# Patient Record
Sex: Male | Born: 1945 | Race: White | Hispanic: No | Marital: Married | Smoking: Current every day smoker
Health system: Southern US, Community
[De-identification: ages and names within clinical notes are randomized; demographics above are authoritative.]

## PROBLEM LIST (undated history)

## (undated) DIAGNOSIS — Z951 Presence of aortocoronary bypass graft: Secondary | ICD-10-CM

## (undated) DIAGNOSIS — I6529 Occlusion and stenosis of unspecified carotid artery: Secondary | ICD-10-CM

## (undated) DIAGNOSIS — J189 Pneumonia, unspecified organism: Secondary | ICD-10-CM

## (undated) DIAGNOSIS — I739 Peripheral vascular disease, unspecified: Secondary | ICD-10-CM

## (undated) DIAGNOSIS — E119 Type 2 diabetes mellitus without complications: Secondary | ICD-10-CM

## (undated) DIAGNOSIS — D751 Secondary polycythemia: Secondary | ICD-10-CM

## (undated) DIAGNOSIS — M1990 Unspecified osteoarthritis, unspecified site: Secondary | ICD-10-CM

## (undated) DIAGNOSIS — G4733 Obstructive sleep apnea (adult) (pediatric): Secondary | ICD-10-CM

## (undated) DIAGNOSIS — R51 Headache: Secondary | ICD-10-CM

## (undated) DIAGNOSIS — I2510 Atherosclerotic heart disease of native coronary artery without angina pectoris: Secondary | ICD-10-CM

## (undated) DIAGNOSIS — I219 Acute myocardial infarction, unspecified: Secondary | ICD-10-CM

## (undated) DIAGNOSIS — E785 Hyperlipidemia, unspecified: Secondary | ICD-10-CM

## (undated) DIAGNOSIS — Z9989 Dependence on other enabling machines and devices: Secondary | ICD-10-CM

## (undated) DIAGNOSIS — R519 Headache, unspecified: Secondary | ICD-10-CM

## (undated) DIAGNOSIS — K219 Gastro-esophageal reflux disease without esophagitis: Secondary | ICD-10-CM

## (undated) DIAGNOSIS — N183 Chronic kidney disease, stage 3 unspecified: Secondary | ICD-10-CM

## (undated) DIAGNOSIS — I714 Abdominal aortic aneurysm, without rupture, unspecified: Secondary | ICD-10-CM

## (undated) DIAGNOSIS — Z720 Tobacco use: Secondary | ICD-10-CM

## (undated) DIAGNOSIS — I10 Essential (primary) hypertension: Secondary | ICD-10-CM

## (undated) HISTORY — DX: Obstructive sleep apnea (adult) (pediatric): G47.33

## (undated) HISTORY — DX: Presence of aortocoronary bypass graft: Z95.1

## (undated) HISTORY — DX: Dependence on other enabling machines and devices: Z99.89

## (undated) HISTORY — DX: Atherosclerotic heart disease of native coronary artery without angina pectoris: I25.10

## (undated) HISTORY — DX: Occlusion and stenosis of unspecified carotid artery: I65.29

## (undated) HISTORY — DX: Hyperlipidemia, unspecified: E78.5

## (undated) HISTORY — DX: Abdominal aortic aneurysm, without rupture: I71.4

## (undated) HISTORY — DX: Gastro-esophageal reflux disease without esophagitis: K21.9

## (undated) HISTORY — DX: Tobacco use: Z72.0

## (undated) HISTORY — DX: Abdominal aortic aneurysm, without rupture, unspecified: I71.40

## (undated) HISTORY — DX: Peripheral vascular disease, unspecified: I73.9

## (undated) HISTORY — DX: Essential (primary) hypertension: I10

## (undated) HISTORY — DX: Secondary polycythemia: D75.1

---

## 1986-05-24 HISTORY — PX: BACK SURGERY: SHX140

## 1994-05-24 DIAGNOSIS — Z951 Presence of aortocoronary bypass graft: Secondary | ICD-10-CM

## 1994-05-24 DIAGNOSIS — I219 Acute myocardial infarction, unspecified: Secondary | ICD-10-CM

## 1994-05-24 HISTORY — DX: Presence of aortocoronary bypass graft: Z95.1

## 1994-05-24 HISTORY — PX: CORONARY ARTERY BYPASS GRAFT: SHX141

## 1994-05-24 HISTORY — DX: Acute myocardial infarction, unspecified: I21.9

## 1996-05-24 HISTORY — PX: LUMBAR DISC SURGERY: SHX700

## 2000-03-05 ENCOUNTER — Ambulatory Visit (HOSPITAL_COMMUNITY): Admission: RE | Admit: 2000-03-05 | Discharge: 2000-03-06 | Payer: Self-pay | Admitting: Family Medicine

## 2000-03-05 ENCOUNTER — Inpatient Hospital Stay: Admission: RE | Admit: 2000-03-05 | Discharge: 2000-03-06 | Payer: Self-pay | Admitting: Family Medicine

## 2000-03-05 ENCOUNTER — Encounter: Payer: Self-pay | Admitting: Family Medicine

## 2000-03-06 ENCOUNTER — Encounter: Payer: Self-pay | Admitting: Family Medicine

## 2000-03-11 ENCOUNTER — Ambulatory Visit (HOSPITAL_COMMUNITY): Admission: RE | Admit: 2000-03-11 | Discharge: 2000-03-11 | Payer: Self-pay | Admitting: Family Medicine

## 2000-03-11 ENCOUNTER — Encounter: Payer: Self-pay | Admitting: Family Medicine

## 2000-03-15 ENCOUNTER — Ambulatory Visit (HOSPITAL_BASED_OUTPATIENT_CLINIC_OR_DEPARTMENT_OTHER): Admission: RE | Admit: 2000-03-15 | Discharge: 2000-03-15 | Payer: Self-pay | Admitting: Orthopedic Surgery

## 2003-11-11 ENCOUNTER — Ambulatory Visit (HOSPITAL_COMMUNITY): Admission: RE | Admit: 2003-11-11 | Discharge: 2003-11-11 | Payer: Self-pay | Admitting: Gastroenterology

## 2003-11-11 ENCOUNTER — Encounter (INDEPENDENT_AMBULATORY_CARE_PROVIDER_SITE_OTHER): Payer: Self-pay | Admitting: *Deleted

## 2007-11-22 ENCOUNTER — Ambulatory Visit (HOSPITAL_COMMUNITY): Admission: RE | Admit: 2007-11-22 | Discharge: 2007-11-23 | Payer: Self-pay | Admitting: Cardiovascular Disease

## 2007-11-22 HISTORY — PX: OTHER SURGICAL HISTORY: SHX169

## 2007-12-21 ENCOUNTER — Ambulatory Visit: Payer: Self-pay | Admitting: *Deleted

## 2008-01-15 ENCOUNTER — Ambulatory Visit: Payer: Self-pay | Admitting: *Deleted

## 2008-01-15 ENCOUNTER — Encounter (INDEPENDENT_AMBULATORY_CARE_PROVIDER_SITE_OTHER): Payer: Self-pay | Admitting: *Deleted

## 2008-01-15 ENCOUNTER — Inpatient Hospital Stay (HOSPITAL_COMMUNITY): Admission: RE | Admit: 2008-01-15 | Discharge: 2008-01-16 | Payer: Self-pay | Admitting: *Deleted

## 2008-01-15 DIAGNOSIS — I6529 Occlusion and stenosis of unspecified carotid artery: Secondary | ICD-10-CM

## 2008-01-15 HISTORY — DX: Occlusion and stenosis of unspecified carotid artery: I65.29

## 2008-01-15 HISTORY — PX: CAROTID ENDARTERECTOMY: SUR193

## 2008-02-01 ENCOUNTER — Ambulatory Visit: Payer: Self-pay | Admitting: *Deleted

## 2009-06-12 DIAGNOSIS — I739 Peripheral vascular disease, unspecified: Secondary | ICD-10-CM

## 2009-06-12 HISTORY — DX: Peripheral vascular disease, unspecified: I73.9

## 2010-10-06 NOTE — Consult Note (Signed)
VASCULAR SURGERY CONSULTATION   Barry Lawrence, Barry Lawrence  DOB:  [birth year]                                       12/21/2007  [id number]   PRIMARY CARE PHYSICIAN:  Kathryne Eriksson, M.D.   REASON FOR REFERRAL:  Severe right internal carotid artery stenosis.   HISTORY:  The patient is a 65 year old gentleman referred for evaluation  and management of his severe right internal carotid artery stenosis.  Denies a history of stroke.  No recent visual, sensory, motor or speech  problems.  No syncope or presyncope.   Risk factors for cerebrovascular disease include hypertension,  hyperlipidemia, coronary artery disease, family history, and tobacco  abuse.   Carotid Doppler carried out 10/17/2007 reveals elevated velocities in  the bulb and proximal right internal carotid artery consistent with a  severe stenosis.   Cerebral arteriogram was carried out 11/22/2007 and verifies severe  right internal carotid artery stenosis with acentric calcified plaque.   PAST MEDICAL HISTORY:  1. Hypertension.  2. Hyperlipidemia.  3. Coronary artery disease status post coronary bypass.  4. Peripheral vascular disease.   MEDICATIONS:  Carvedilol 12.5 mg 1/2 tablet b.i.d.  Amlodipine 10 mg  daily.  Diovan hydrochlorothiazide 160/25 daily.  Tricor 145 mg daily.  Omeprazole 20 mg daily.  Vytorin 10/80 one tablet daily.  Goody's Powder  1 to 2 daily.   ALLERGIES:  None known.   FAMILY HISTORY:  Mother died in an accident at a young age.  Father died  age 43 of heart disease and complications of diabetes.  One sister and  brother each have diabetes.   SOCIAL HISTORY:  The patient is married with 2 children.  He is on long-  term disability.  Continues to smoke up to 1 pack of cigarettes daily.  Does not drink alcohol on a regular basis.   REVIEW OF SYSTEMS:  Refer to patient encounter form.  The patient notes  some gastroesophageal reflux.  Pain in legs with walking.  Refer to  patient encounter form.   PHYSICAL EXAM:  A 65 year old male who appears stated age.  No acute  distress.  Alert and oriented.  Vital Signs:  BP 128/75, pulse 67 per  minute.  HEENT:  Mouth and throat are clear.  Normocephalic.  Extraocular movements intact.  Neck:  Supple.  No thyromegaly,  adenopathy.  Cardiovascular:  Normal heart sounds without murmurs.  Right carotid bruit.  Regular rate and rhythm.  Chest:  Clear equal  entry bilaterally.  Abdomen:  Soft, nontender, no masses or  organomegaly.  Neurologic:  Cranial nerves intact.  Strength equal  bilaterally.  1+ reflexes bilaterally.  Normal gait.  Skin:  Warm, dry,  intact.   IMPRESSION:  1. Severe right internal carotid artery stenosis.  2. Hypertension.  3. Hyperlipidemia.  4. Coronary artery disease.  5. Peripheral vascular disease.   RECOMMENDATIONS:  The patient will be placed on the elective schedule  for right carotid endarterectomy for reduction of stroke risk.  Continue  aspirin on a daily basis.  Reduce tobacco use.   Dorothea Glassman, M.D.  Electronically Signed  PGH/MEDQ  D:  12/21/2007  T:  12/22/2007  Job:  [id number]   cc:   Jama Flavors. Redmond Pulling, M.D.  Quay Burow, M.D.

## 2010-10-06 NOTE — Discharge Summary (Signed)
NAME:  [name], [name] NO.:  [id number]   MEDICAL RECORD NO.:  [id number]          PATIENT TYPE:  OIB   LOCATION:  [id number]                         FACILITY:  [facility]   PHYSICIAN:  Quay Burow, M.D.   DATE OF BIRTH:  11/04/1945   DATE OF ADMISSION:  11/22/2007  DATE OF DISCHARGE:  11/23/2007                               DISCHARGE SUMMARY   [name] is a 65 year old male patient of Dr. Quay Burow  who has known coronary disease and peripheral vascular disease.  He was  seen in the office on November 14, 2007, for follow up Dopplers.  His  carotids showed a high-grade stenosis.  He was having claudication with  known lower extremity PVD in the past.  Thus, he was set up for PV  angiogram of carotids and lower extremities.  This was performed on November 22, 2007, by Dr. Einar Gip.  He had an 80% new right external iliac artery  stenosis.  He did have old iliac stent that was patent.  He had  aneurysmal small dilatation of his aorta.  His carotids revealed that he  had 95% right ICA stenosis.  Left ICA had mild plaque.  He had 40% right  distal codominant vertebral stenosis, left vertebra came off arch that  was small and nondominant.  He had normal intracerebral vasculature.  Please see Dr. Irven Shelling procedure dictation for complete details.  He  subsequently underwent stenting of his right external iliac with a SMART  Control iliac stent 10 mm x 40 mm.  The following morning, he was seen  by Dr. Quay Burow and considered stable for discharge home.   His blood pressure was 149/88.  His pulse was 58.   His medications are the same as prior to hospitalization which include  amlodipine 10 mg a diet, Diovan 160/25 a day, Tricor 145 mg a day,  Vytorin 10/80 a day, omeprazole 20 mg a day, carvedilol 12.5 mg half  b.i.d. which he takes 5 day, but he is not on aspirin.   He will follow up with Dr. Gwenlyn Found on December 01, 2007.   He has follow up Dopplers on December 11, 2007, and  he was again reminded to  quit smoking.   Labs today showed sodium 138, potassium 4.1, glucose 85, BUN 16, and  creatinine 1.28.  His CBC showed hemoglobin of 19, hematocrit was 54.7,  WBCs 10, and platelets were 198,000.   ASSESSMENT:  1. Peripheral vascular disease status post lower extremity peripheral      vascular angio and cerebral angio showing high-grade stenosis of      his carotid artery.  Dr. Gwenlyn Found reviewed, things he will need a CEA      and he will follow up with Dr. Gwenlyn Found and be referred to a vascular      surgeon.  He did have a right external iliac artery new stenosis      which was stented by Dr. Adrian Prows.  2. Known coronary artery disease, stable with no angina.  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  4. Tobacco smoking.  His  has tried to decrease in the past, states he      is unable to quit.  He was encouraged again to quit.  5. Hyperlipidemia.      Cyndia Bent, N.P.      Quay Burow, M.D.  Electronically Signed    BB/MEDQ  D:  11/23/2007  T:  11/24/2007  Job:  [id number]

## 2010-10-06 NOTE — Cardiovascular Report (Signed)
Barry Lawrence, Barry Lawrence NO.:  [id number]   MEDICAL RECORD NO.:  [id number]          PATIENT TYPE:  INP   LOCATION:  59                         FACILITY:  [facility]   PHYSICIAN:  Eden Lathe. Einar Gip, MD       DATE OF BIRTH:  15-Apr-1946   DATE OF PROCEDURE:  DATE OF DISCHARGE:                            CARDIAC CATHETERIZATION   ASSISTANT:  Quay Burow, MD   PRIMARY OPERATOR:  Eden Lathe. Einar Gip, MD   PROCEDURE PERFORMED:  1. Arch aortogram.  2. Four-vessel cerebral angiography, both intracranial and      extracranial.  3. Abdominal aortogram.  4. Pelvic aortogram.  5. PTA and direct stenting of the right external iliac artery.   INDICATIONS:  [name] is a 64 year old gentleman with known  coronary artery disease with history of bypass in 1996.  He has been  doing well from a cardiac standpoint.  He was found to have carotid  artery high-grade stenosis by carotid duplex with velocities of 123456  cm/sec, systolic and diastolic of 98 cm/sec.  Given this, he was brought  to the peripheral angiography suite to evaluate his cerebral anatomy for  confirming his severity of the stenoses.  Abdominal aortogram and pelvic  aortogram were performed because of known peripheral arterial disease  and he has undergone PTA and stenting of bilateral iliac arteries in  1997, and also left SFA in 1998.  We had difficulty in access and there  was suspicion for high-grade stenosis in the right external iliac  artery.  Hence, iliac arteriogram and eventually angioplasty was  performed.  He also had abnormal Dopplers of the lower extremities  revealing a 340 cm/sec velocities across the right external iliac artery  on Oct 17, 2007.  His ABI was 0.88 and 0.87 on the right and left  respectively.   ANGIOGRAPHIC DATA:  Arch aortogram:  There is type 1 aortic arch.  The  left vertebral artery arose directly from the arch of the aorta between  the innominate and left common carotid artery.   It was small,  nondominant, and normal.   Right carotid artery:  The right carotid artery was smooth and normal.  Right internal carotid artery showed eccentric calcification with a 95%  stenosis.  There was poststenotic dilatation.  The external carotid  artery was normal.   The intracerebral portion of the right internal carotid artery was  normal with normal cavernous filling.   Right vertebral artery:  The right vertebral artery was normal at its  origin; however, in the mid-to-distal segment, there is a 40% stenoses.  This was a large vertebral artery and a dominant vertebral artery.   The intracerebral portion of the left common carotid artery was normal  with normal cavernous filling.   Right innominate and proximal right subclavian:  Right innominate and  right proximal subclavian artery were widely patent.   Left common carotid artery:  The left common carotid artery was widely  patent and the left internal carotid artery showed a 20%-30% stenoses.  The external carotid artery was normal.   Intracerebral posterior circulation  was normal with normal venous  filling.   Left vertebral artery:  The left vertebral artery was not cannulated as  this was small and nondominant and arose from the arch of the aorta.   Abdominal aortogram:  The abdominal aortogram revealed presence of two  renal arteries one on either sides.  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   Left iliac artery:  The left iliac artery was selectively cannulated and  this revealed left iliac artery stent to be widely patent, left iliac  artery ostium was also widely patent.  There was a 30% stenosis in the  left common iliac artery.  The previously placed left external iliac  artery stent is widely patent.   Right iliac artery:  The right common iliac artery showed tortuosity,  but is otherwise normal, and the previously placed stent was also widely  patent.  Right common iliac  artery stent was widely patent; however,  there was a 70%-80% stenosis of the right external iliac artery.  There  was about a 25-mm pressure gradient across this stenoses.   INTERVENTION DATA:  Successful PTA and direct stenting of the right  external iliac artery overlapping with the previously placed right  common iliac artery stent with implantation of a 10.0 x 40 mm Smart self-  expanding stent, which was postdilated with a 7.0 x 40 mm Powerflex  balloon at 5 atmospheric pressure.  The stenosis was reduced from 80% to  0% with excellent results.   RECOMMENDATIONS:  The patient will need revascularization of his right  carotid artery stenosis.  Dr. Quay Burow will follow up with the  patient for further recommendation.   He will need continued aggressive risk modification.  He will be  discharged home in the morning if he remain stable.   TECHNIQUE OF THE PROCEDURE:  Under usual sterile precautions using a 5-  French right femoral arterial access we attempted to cross through the  right iliac artery.  There was difficulty in doing this, but with use of  a H1 catheter we were able to manipulate through the right iliac  stenosis and also right iliac artery stent, and the wooly wire was  placed into the abdominal aorta.  All the catheter exchanges were done  over a long wire.   Using a pigtail catheter we were able to advance the pigtail catheter  into the arch of the aorta and arch aortogram was performed in the LAO  projection.  Then using JB1 catheter selective cannulation of the  cerebral vessels were performed.  The catheter was then pulled out of  the body over a wooly wire.   Using a crossover catheter, the left iliac artery was selectively  cannulated and angiography was performed.  The wooly wire was placed  back into the abdominal aorta.  The crossover catheter was pulled out of  the body and we decided to proceed with intervention to the right  external iliac  artery.   We used a 6-French long sheath and we administered 300 mcg of  intraarterial nitroglycerin and careful pullback of the right external  iliac artery stenosis was performed.  Having performed this, there was a  25-mm pressure gradient, hence we decided to stent this and stenting was  performed using a 10.0 x 40 mm Smart stent and postdilated with a  7.0 x 40 mm Powerflex balloon with excellent results.  The sheath was  sutured in place and the patient was transferred to  the holding area in  a stable condition.  The patient tolerated the procedure well.  No  immediate complications noted.      Eden Lathe. Einar Gip, MD  Electronically Signed     JRG/MEDQ  D:  11/22/2007  T:  11/23/2007  Job:  [id number]   cc:   Jama Flavors. Redmond Pulling, M.D.

## 2010-10-06 NOTE — Assessment & Plan Note (Signed)
OFFICE VISIT   Lawrence, Barry L  DOB:  1946/02/22                                       02/01/2008  [id number]   The patient underwent a right carotid endarterectomy on 01/15/2008 at  [facility].  He was discharged home postop day #1 in good  condition.  No major complaints.  Since discharge he has been doing  well.   His blood pressure is 165/89, pulse is 67 per minute.  Right neck  incision healing well.  No carotid bruits.  Cranial nerves intact.  Strength equal bilaterally.   The patient overall is doing well.  I will plan to follow up with him  again in 6 months with a repeat carotid Doppler evaluation.   Dorothea Glassman, M.D.  Electronically Signed   PGH/MEDQ  D:  02/01/2008  T:  02/02/2008  Job:  [id number]   cc:   Jama Flavors. Redmond Pulling, M.D.

## 2010-10-06 NOTE — Op Note (Signed)
NAMEJOANDRY, WOLZ NO.:  [id number]   MEDICAL RECORD NO.:  [id number]          PATIENT TYPE:  INP   LOCATION:  [id number]                         FACILITY:  [facility]   PHYSICIAN:  Dorothea Glassman, M.D.    DATE OF BIRTH:  1945-10-24   DATE OF PROCEDURE:  01/15/2008  DATE OF DISCHARGE:  01/16/2008                               OPERATIVE REPORT   SURGEON:  Dorothea Glassman, MD   ASSISTANT:  Jacinta Shoe, PA   ANESTHESIA:  General endotracheal.   PREOPERATIVE DIAGNOSIS:  Severe right internal carotid artery stenosis.   POSTOPERATIVE DIAGNOSIS:  Severe right internal carotid artery stenosis.   PROCEDURE:  Right carotid endarterectomy with Dacron patch angioplasty.   CLINICAL NOTE:  Barry Lawrence is a 65 year old male with a history of  atherosclerotic vascular disease.  A right carotid stenosis was  discovered and arteriography verified a severe right internal carotid  artery stenosis.  The patient was referred for surgical management, seen  and evaluated, he consented to right carotid endarterectomy for  reduction of stroke risk.  Risks of the operative procedure were  reviewed with the patient preoperatively including but not limited to  MI, CVA, cranial nerve injury, and death.   OPERATIVE PROCEDURE:  The patient was brought to the operating room in a  stable hemodynamic condition.  Placed under general endotracheal  anesthesia.  Right neck prepped and draped in a sterile fashion.  Foley  catheter and arterial line in place.   Curvilinear skin incision made along the anterior border of right  sternomastoid muscle.  Dissection carried down through the subcutaneous  tissue with electrocautery.  Platysma divided.  Deep dissection carried  down to expose the facial vein, which was ligated with 2-0 silk and  divided.  The carotid bifurcation exposed, common carotid artery  mobilized down to the omohyoid muscle encircled with a vessel loop.  The  vagus nerve  reflected posteriorly and preserved.  The superior thyroid  and external carotid arteries were freed and encircled with vessel  loops.  The internal carotid artery followed up distally to the  posterior belly of the digastric muscle.  The hypoglossal nerve  identified, mobilized, and retracted.  The distal internal carotid  artery encircled with a vessel loop.   Evaluation of the carotid bifurcation did reveal plaque in the bulb  extending well up into the internal carotid arteries 2-3 cm.  The distal  internal carotid artery was free of significant disease.   The patient was administered 7000 units of heparin intravenously.  Adequate circulation time permitted.  The carotid vessels controlled  with clamps.  Longitudinal arteriotomy made in the distal common carotid  artery.  The arteriotomy extended across the carotid bulb and up into  the internal carotid artery.  There was a high-grade stenosis at the  origin of the right internal carotid artery with a long segment of  plaque extending into the internal carotid artery 2-3 cm.  A shunt was  inserted.   An endarterectomy elevator used to remove the plaque.  The  endarterectomy carried down to the common  carotid artery with plaque was  divided transversely with Potts scissors.  Plaque then raised up into  the bulb and superior thyroid and external carotid endarterectomized  using an eversion technique.  The distal internal carotid artery plaque  feathered out well.  The fragments of plaque removed with fine forceps.  The site irrigated with heparin saline solution.   A patch angioplasty endarterectomy site was then carried out with a  Finesse Dacron patch using running 6-0 Prolene suture.  At completion of  the patch angioplasty, the shunt was removed and all vessels well  flushed.  Clamps removed directing initial antegrade flow up the  external carotid artery, following this the internal carotid artery was  released.   There is  an excellent pulse and Doppler signal in the distal internal  carotid artery.   Adequate hemostasis obtained.  Sponge and instrument counts correct.  The patient was administered 50 mg protamine intravenously.   The sternomastoid fascia closed in a running 2-0 Vicryl suture.  Platysma closed in a running 3-0 Vicryl suture.  Skin closed in 4-0  Monocryl.  Dermabond applied.   The patient tolerated the procedure well.  No apparent complications.  Awakened readily and was neurologically intact.  Transferred to recovery  room in stable condition.      Dorothea Glassman, M.D.  Electronically Signed     PGH/MEDQ  D:  01/16/2008  T:  01/16/2008  Job:  [id number]   cc:   Quay Burow, M.D.  Jama Flavors. Redmond Pulling, M.D.

## 2010-10-06 NOTE — Discharge Summary (Signed)
NAMEHAIDEN, GARDE NO.:  [id number]   MEDICAL RECORD NO.:  [id number]          PATIENT TYPE:  INP   LOCATION:  [id number]                         FACILITY:  [facility]   PHYSICIAN:  Dorothea Glassman, M.D.    DATE OF BIRTH:  05/30/1945   DATE OF ADMISSION:  01/15/2008  DATE OF DISCHARGE:  01/16/2008                               DISCHARGE SUMMARY   ADMISSION DIAGNOSIS:  Severe right internal carotid artery stenosis,  asymptomatic.   SECONDARY DIAGNOSES:  1. Severe right internal carotid artery stenosis, asymptomatic status      post right carotid endarterectomy.  2. Coronary artery disease, followed by Dr. Gwenlyn Found.  3. Peripheral vascular disease status post lower extremity peripheral      vascular angioplasty and right external iliac artery stenosis which      was stented by Dr. Adrian Prows.  4. History of polycythemia secondary to smoking.  5. Ongoing tobacco abuse.  6. Hyperlipidemia.  7. Hypertension.  8. Gastroesophageal reflux disease.  9. History of coronary artery bypass graft in 1996.  10.History of lumbar diskectomy in 1998.  11.History of obstructive sleep apnea but is noncompliant with CPAP.  12.Mild renal insufficiency with postoperative creatinine of 1.6 with      baseline creatinine around 1.3.   PROCEDURES:  January 15, 2008, right carotid endarterectomy with Dacron  patch angioplasty by Dr. Gordy Clement.   BRIEF HISTORY:  Mr. Lemm is a 65 year old male referred to Dr. Amedeo Plenty  for evaluation and management of severe right internal carotid artery  stenosis.  He is asymptomatic.  Carotid Dopplers from Oct 17, 2007  revealed elevated velocities in the bulb and proximal right internal  carotid artery consistent with severe stenosis.  Cerebral arteriogram  was carried out on November 22, 2007 by Dr. Adrian Prows, verified severe right  internal carotid artery stenosis with eccentric calcified plaque.  Dr.  Amedeo Plenty recommended that he undergo elective right carotid  endarterectomy,  to reduce his risk for future stroke, and also per smoking cessation.   HOSPITAL COURSE:  Mr. Arth was electively admitted to [facility] and underwent the previously mentioned procedure.  He was  extubated, neurologically intact, taken to short-stay recovery and was  transferred to 3300.  In the immediate postoperative phase, he was  somewhat drowsy and was felt to be having obstructive sleep apnea  symptoms as he required frequent awakening due to desaturations with  sleeping with associated bradycardia and hypotension, which quickly  rebounded when the patient is awakened.  Also after surgery, he was  noted to have some blood-tinged urine.  His Foley catheter was irrigated  and on postop day #1 had been discontinued and he was voiding.  As a  precaution, we did send a urinalysis as it had a faint odor, however,  this showed no signs for infection as it was negative for leukocytes and  nitrates.  There was trace blood, which again was felt secondary to his  Foley catheter insertion.  Otherwise, Mr. Kilday is doing well.  He  remained neurologically intact.  Incision showed no signs  of hematoma.  Tongue was midline and he denied dysphagia.  He did have some muscle  spasm which was treated with Flexeril.  By discharge, his pain was  controlled.  He mobilized and tolerating regular food and ambulating  without difficulty.  Postoperative labs showed white count 11.2,  hemoglobin 15.8, hematocrit 46.2, and platelet count 165.  Sodium 132,  potassium 4.0, glucose 95, creatinine 1.6, and BUN of 21.  His  preoperative creatinine was 1.28.  Mr. Wong was felt appropriate for  discharge home on postoperative day #1, January 16, 2008.  He was  discharged in stable condition.   DISCHARGE MEDICATIONS:  1. Coreg 12.5 mg one-half tablet p.o. b.i.d.  2. Amlodipine 10 mg 1 p.o. daily.  3. TriCor 145 mg p.o. q.p.m.  4. Omeprazole 20 mg p.o. q.a.m.  5. Vytorin 10/80 mg  1 p.o. q.p.m.  6. Diovan HCT 160/25 mg 1 p.o. q.a.m.  7. Goody powders p.r.n. per pack instructions.  8. Oxycodone 5 mg 1-2 tablets p.o. q.4 h. p.r.n. pain.  9. Coated aspirin 325 mg p.o. daily.   DISCHARGE INSTRUCTIONS:  Continue heart-healthy diet.  Clean incisions  gently with soap and water.  Avoid driving or heavy lifting for the next  2 weeks.  Increase activity slowly.  Call if he develops fever greater  than 101, redness or drainage from incision site or new neurologic  symptoms.  He will see Dr. Amedeo Plenty at the [facility] office in 2-3 weeks.  Our  office will contact him regarding specific appointment date and time.      Jacinta Shoe, P.A.      Dorothea Glassman, M.D.  Electronically Signed    AWZ/MEDQ  D:  01/16/2008  T:  01/17/2008  Job:  [id number]   cc:   Jama Flavors. Redmond Pulling, M.D.  Quay Burow, M.D.
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
## 2010-10-09 NOTE — Op Note (Signed)
Barry Lawrence, Barry Lawrence                         ACCOUNT NO.:  [id number]   MEDICAL RECORD NO.:  [id number]                   PATIENT TYPE:  AMB   LOCATION:  ENDO                                 FACILITY:  [facility]   PHYSICIAN:  Nelwyn Salisbury, M.D.               DATE OF BIRTH:  05-Sep-1945   DATE OF PROCEDURE:  11/11/2003  DATE OF DISCHARGE:                                 OPERATIVE REPORT   PROCEDURE PERFORMED:  Screening colonoscopy.   ENDOSCOPIST:  Nelwyn Salisbury, M.D.   INSTRUMENT USED:  Olympus video colonoscope.   INDICATION FOR PROCEDURE:  A 65 year old white male undergoing screening  colonoscopy.  Rule out colonic polyps, masses, etc.  The patient has a  longstanding history of nonsteroidal use (eight to 10 Goody Powders per  day).  Rule out colonic polyps, masses, etc.   PREPROCEDURE PREPARATION:  Informed consent was procured from the patient.  The patient had fasted for eight hours prior to the procedure and prepped  with a bottle of magnesium citrate and a gallon of GoLYTELY the night prior  to the procedure.   PREPROCEDURE PHYSICAL:  VITAL SIGNS:  The patient had stable vital signs.  NECK:  Supple.  CHEST:  Clear to auscultation.  S1, S2 regular.  ABDOMEN:  Soft with normal bowel sounds.   DESCRIPTION OF PROCEDURE:  The patient was placed in the left lateral  decubitus position and sedated with 75 mg of Demerol and 6 mg of Versed in  slow incremental doses.  Once the patient was adequately sedate and  maintained on low-flow oxygen and continuous cardiac monitoring, the Olympus  video colonoscope was advanced from the rectum to the cecum with difficulty.  There was some residual stool in the right side of the colon.  Multiple  washes were done.  The appendiceal orifice and the ileocecal valve were  clearly visualized and photographed.  Retroflexion in the rectum revealed no  abnormalities.  No masses or polyps were identified.  Small lesions could  have been missed  secondary to a relatively poor prep.   IMPRESSION:  Essentially unrevealing colonoscopy, some residual stool in the  right colon.  Small lesions could have been missed.   RECOMMENDATIONS:  1. Repeat colonoscopy has been recommended in the next five years with a     better prep unless the patient develops any abnormal symptoms in the     interim.  2. Avoid nonsteroidals.  3. Continue a high-fiber diet with liberal fluid intake.  4. Outpatient follow-up within the next two weeks for further     recommendations.                                               Nelwyn Salisbury, M.D.    JNM/MEDQ  D:  11/11/2003  T:  11/12/2003  Job:  [id number]   cc:   Youlanda Roys. Deatra Ina, M.D.  [address]  [place]  [place] [address]  Fax: [phone]

## 2010-10-09 NOTE — Op Note (Signed)
NAME:  Barry, Lawrence                         ACCOUNT NO.:  [id number]   MEDICAL RECORD NO.:  [id number]                   PATIENT TYPE:  AMB   LOCATION:  ENDO                                 FACILITY:  [facility]   PHYSICIAN:  Nelwyn Salisbury, M.D.               DATE OF BIRTH:  Mar 05, 1946   DATE OF PROCEDURE:  11/11/2003  DATE OF DISCHARGE:                                 OPERATIVE REPORT   PROCEDURE PERFORMED:  Esophagogastroduodenoscopy with biopsies.   ENDOSCOPIST:  Nelwyn Salisbury, M.D.   INSTRUMENT USED:  Olympus video panendoscope.   INDICATION FOR PROCEDURE:  A 65 year old white male with a history of  nonsteroidal use (Goody Powders, over eight to 10 packs per day) undergoing  EGD for abdominal pain.  Rule out peptic ulcer disease, esophagitis, etc.   PREPROCEDURE PREPARATION:  Informed consent was procured from the patient.  The patient was fasted for eight hours prior to the procedure.   PREPROCEDURE PHYSICAL:  VITAL SIGNS:  The patient had stable vital signs.  NECK:  Supple.  CHEST:  Clear to auscultation.  S1, S2 regular.  ABDOMEN:  Soft with normal bowel sounds.   DESCRIPTION OF PROCEDURE:  The patient was placed in the left lateral  decubitus position and sedated with 25 mg of Demerol and 3 mg of Versed  intravenously.  Once the patient was adequately sedate and maintained on low-  flow oxygen and continuous cardiac monitoring, the Olympus video  panendoscope was advanced through the mouthpiece, over the tongue, into the  esophagus under direct vision.  The entire esophagus appeared normal with no  evidence of ring, stricture, masses, esophagitis, or Barrett's mucosa.  The  scope was then advanced into the stomach.  Multiple antral ulcers were  noted.  These were biopsied for pathology.  There was no visible vessel  present.  Multiple erosions were also noted in the midbody of the stomach.  Retroflexion in the high cardia revealed a small hiatal hernia.  There was a  linear ulcer seen in the duodenal bulb.  The postbulbar area appeared  normal.  There was no outlet obstruction.  The patient tolerated the  procedure well without immediate complications.   IMPRESSION:  1. Normal-appearing esophagus.  2. Small hiatal hernia.  3. Multiple antral ulcers.  4. Linear ulcer in duodenal bulb.  5. Normal proximal small bowel distal to the bulb, biopsies done for     Helicobacter pylori by pathology.   RECOMMENDATIONS:  1. Continue PPIs.  2. Avoid nonsteroidals including aspirin.  3. Treat with antibiotics if H. pylori present.  4. Proceed with a colonoscopy at this time.  Further recommendations will be     made after the colonoscopy has been done.  Nelwyn Salisbury, M.D.    JNM/MEDQ  D:  11/11/2003  T:  11/12/2003  Job:  [id number]   cc:   Youlanda Roys. Deatra Ina, M.D.  [address]  [place]  [place] [address]  Fax: [phone]
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
## 2011-02-18 LAB — CBC
HCT: 54.7 — ABNORMAL HIGH
Hemoglobin: 19 — ABNORMAL HIGH
MCHC: 34.7
MCV: 91.9
Platelets: 198
RBC: 5.96 — ABNORMAL HIGH
RDW: 14.2
WBC: 10

## 2011-02-18 LAB — BASIC METABOLIC PANEL
BUN: 16
CO2: 31
Calcium: 9.6
Chloride: 98
Creatinine, Ser: 1.28
GFR calc Af Amer: >60
GFR calc non Af Amer: 57 — ABNORMAL LOW
Glucose, Bld: 85
Potassium: 4.1
Sodium: 138

## 2012-07-15 ENCOUNTER — Encounter: Payer: Self-pay | Admitting: *Deleted

## 2012-07-15 DIAGNOSIS — N289 Disorder of kidney and ureter, unspecified: Secondary | ICD-10-CM

## 2012-07-15 DIAGNOSIS — K219 Gastro-esophageal reflux disease without esophagitis: Secondary | ICD-10-CM

## 2012-07-15 DIAGNOSIS — I739 Peripheral vascular disease, unspecified: Secondary | ICD-10-CM

## 2012-07-15 DIAGNOSIS — I6521 Occlusion and stenosis of right carotid artery: Secondary | ICD-10-CM | POA: Insufficient documentation

## 2012-07-15 DIAGNOSIS — I10 Essential (primary) hypertension: Secondary | ICD-10-CM

## 2012-07-15 DIAGNOSIS — I2510 Atherosclerotic heart disease of native coronary artery without angina pectoris: Secondary | ICD-10-CM

## 2012-07-15 DIAGNOSIS — I6529 Occlusion and stenosis of unspecified carotid artery: Secondary | ICD-10-CM

## 2012-07-15 DIAGNOSIS — I119 Hypertensive heart disease without heart failure: Secondary | ICD-10-CM | POA: Insufficient documentation

## 2012-07-15 DIAGNOSIS — E785 Hyperlipidemia, unspecified: Secondary | ICD-10-CM

## 2012-12-11 ENCOUNTER — Other Ambulatory Visit (HOSPITAL_COMMUNITY): Payer: Self-pay | Admitting: Cardiovascular Disease

## 2012-12-11 DIAGNOSIS — I714 Abdominal aortic aneurysm, without rupture: Secondary | ICD-10-CM

## 2012-12-13 ENCOUNTER — Other Ambulatory Visit (HOSPITAL_COMMUNITY): Payer: Self-pay | Admitting: Cardiovascular Disease

## 2012-12-13 ENCOUNTER — Telehealth (HOSPITAL_COMMUNITY): Payer: Self-pay | Admitting: Cardiovascular Disease

## 2012-12-13 DIAGNOSIS — I739 Peripheral vascular disease, unspecified: Secondary | ICD-10-CM

## 2012-12-18 ENCOUNTER — Telehealth (HOSPITAL_COMMUNITY): Payer: Self-pay | Admitting: Cardiovascular Disease

## 2012-12-29 ENCOUNTER — Telehealth (HOSPITAL_COMMUNITY): Payer: Self-pay | Admitting: Cardiovascular Disease

## 2013-01-01 ENCOUNTER — Encounter (HOSPITAL_COMMUNITY): Payer: Self-pay

## 2013-01-08 ENCOUNTER — Telehealth (HOSPITAL_COMMUNITY): Payer: Self-pay | Admitting: Cardiovascular Disease

## 2013-01-16 ENCOUNTER — Ambulatory Visit (HOSPITAL_COMMUNITY)
Admission: RE | Admit: 2013-01-16 | Discharge: 2013-01-16 | Disposition: A | Discharge status: Home / Self Care | Payer: BC Managed Care – HMO | Source: Ambulatory Visit | Attending: Cardiovascular Disease | Admitting: Cardiovascular Disease

## 2013-01-16 DIAGNOSIS — I739 Peripheral vascular disease, unspecified: Secondary | ICD-10-CM | POA: Insufficient documentation

## 2013-01-16 DIAGNOSIS — I70219 Atherosclerosis of native arteries of extremities with intermittent claudication, unspecified extremity: Secondary | ICD-10-CM

## 2013-01-16 NOTE — Progress Notes (Signed)
Arterial Duplex Lower Ext. Completed. Amorita Vanrossum, BS, RDMS, RVT  

## 2013-01-17 ENCOUNTER — Telehealth: Payer: Self-pay | Admitting: *Deleted

## 2013-01-17 NOTE — Telephone Encounter (Signed)
Patient walked in on 8/26 and presented ischemic heart disease papers from the [place] that needed to be filled out.    Papers were filled out and signed by Dr Gwenlyn Found  Papers were mailed to patient at his request

## 2013-01-18 ENCOUNTER — Other Ambulatory Visit (HOSPITAL_COMMUNITY): Payer: Self-pay | Admitting: Cardiovascular Disease

## 2013-01-18 NOTE — Telephone Encounter (Signed)
Rx was sent to pharmacy electronically. 

## 2013-01-24 ENCOUNTER — Other Ambulatory Visit: Payer: Self-pay | Admitting: *Deleted

## 2013-01-24 MED ORDER — EZETIMIBE 10 MG PO TABS: ORAL_TABLET | ORAL | Status: DC

## 2013-01-24 NOTE — Telephone Encounter (Signed)
Rx was sent to pharmacy electronically. 

## 2013-02-15 ENCOUNTER — Ambulatory Visit: Payer: Medicare Other | Admitting: Cardiovascular Disease

## 2013-02-16 ENCOUNTER — Ambulatory Visit (INDEPENDENT_AMBULATORY_CARE_PROVIDER_SITE_OTHER): Payer: BC Managed Care – HMO | Admitting: Cardiovascular Disease

## 2013-02-16 ENCOUNTER — Encounter: Payer: Self-pay | Admitting: *Deleted

## 2013-02-16 ENCOUNTER — Encounter: Payer: Self-pay | Admitting: Cardiovascular Disease

## 2013-02-16 VITALS — BP 170/90 | HR 57 | Ht 67.0 in | Wt 195.0 lb

## 2013-02-16 DIAGNOSIS — R5381 Other malaise: Secondary | ICD-10-CM

## 2013-02-16 DIAGNOSIS — R0602 Shortness of breath: Secondary | ICD-10-CM

## 2013-02-16 DIAGNOSIS — Z79899 Other long term (current) drug therapy: Secondary | ICD-10-CM

## 2013-02-16 DIAGNOSIS — R3911 Hesitancy of micturition: Secondary | ICD-10-CM

## 2013-02-16 DIAGNOSIS — E785 Hyperlipidemia, unspecified: Secondary | ICD-10-CM

## 2013-02-16 DIAGNOSIS — R600 Localized edema: Secondary | ICD-10-CM

## 2013-02-16 DIAGNOSIS — I2510 Atherosclerotic heart disease of native coronary artery without angina pectoris: Secondary | ICD-10-CM

## 2013-02-16 DIAGNOSIS — R5383 Other fatigue: Secondary | ICD-10-CM

## 2013-02-16 DIAGNOSIS — I739 Peripheral vascular disease, unspecified: Secondary | ICD-10-CM

## 2013-02-16 DIAGNOSIS — R609 Edema, unspecified: Secondary | ICD-10-CM

## 2013-02-16 DIAGNOSIS — I714 Abdominal aortic aneurysm, without rupture: Secondary | ICD-10-CM

## 2013-02-16 LAB — LIPID PANEL
Cholesterol: 117 mg/dL (ref 0–200)
HDL: 25 mg/dL — ABNORMAL LOW (ref >39)
LDL Cholesterol: 55 mg/dL (ref 0–99)
Total CHOL/HDL Ratio: 4.7 Ratio
Triglycerides: 187 mg/dL — ABNORMAL HIGH (ref <150)
VLDL: 37 mg/dL (ref 0–40)

## 2013-02-16 LAB — CBC
HCT: 58.1 % — ABNORMAL HIGH (ref 39.0–52.0)
Hemoglobin: 20.6 g/dL — ABNORMAL HIGH (ref 13.0–17.0)
MCH: 32 pg (ref 26.0–34.0)
MCHC: 35.5 g/dL (ref 30.0–36.0)
MCV: 90.2 fL (ref 78.0–100.0)
Platelets: 196 10*3/uL (ref 150–400)
RBC: 6.44 MIL/uL — ABNORMAL HIGH (ref 4.22–5.81)
RDW: 16.4 % — ABNORMAL HIGH (ref 11.5–15.5)
WBC: 8.6 10*3/uL (ref 4.0–10.5)

## 2013-02-16 LAB — BASIC METABOLIC PANEL
BUN: 28 mg/dL — ABNORMAL HIGH (ref 6–23)
CO2: 31 mEq/L (ref 19–32)
Calcium: 10.1 mg/dL (ref 8.4–10.5)
Chloride: 99 mEq/L (ref 96–112)
Creat: 1.64 mg/dL — ABNORMAL HIGH (ref 0.50–1.35)
Glucose, Bld: 86 mg/dL (ref 70–99)
Potassium: 4.1 mEq/L (ref 3.5–5.3)
Sodium: 138 mEq/L (ref 135–145)

## 2013-02-16 LAB — HEPATIC FUNCTION PANEL
ALT: 21 U/L (ref 0–53)
AST: 19 U/L (ref 0–37)
Albumin: 4.3 g/dL (ref 3.5–5.2)
Alkaline Phosphatase: 39 U/L (ref 39–117)
Bilirubin, Direct: 0.2 mg/dL (ref 0.0–0.3)
Indirect Bilirubin: 0.5 mg/dL (ref 0.0–0.9)
Total Bilirubin: 0.7 mg/dL (ref 0.3–1.2)
Total Protein: 7 g/dL (ref 6.0–8.3)

## 2013-02-16 LAB — PSA: PSA: 1.87 ng/mL (ref <=4.00)

## 2013-02-16 LAB — TSH: TSH: 1.824 u[IU]/mL (ref 0.350–4.500)

## 2013-02-16 NOTE — Assessment & Plan Note (Signed)
Status post remote right carotid endarterectomy performed by Dr. Erskin Burnet 01/20/08. He also has bilateral iliac stenting by myself. He has a moderate size abdominal aortic aneurysm which has grown over the last 6 months from 4.3 x 4.6-4.8 x 4.9 he just complained of claudication bilaterally. 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 At this point he desires to continue to follow this conservatively. His abdominal aortic aneurysm is almost the size requiring revascularization. We will discuss this again in 6 months.

## 2013-02-16 NOTE — Assessment & Plan Note (Signed)
Status post coronary bypass grafting in 1996 with a LIMA to his LAD, sequential vein to OM1 and 2 postoperative acute marginal branch, a diagonal branch. Myoview performed 04/21/11 was nonischemic. He denies chest pain but does complain of dyspnea on exertion as well as new lower extremity edema. In addition, he has a new right bundle-branch block today. I'm going to get a 2-D echocardiogram to evaluate LV function.

## 2013-02-16 NOTE — Patient Instructions (Addendum)
  Your physician wants you to follow-up with him in : 6 months with Dr Andria Rhein will receive a reminder letter in the mail one month in advance. If you don't receive a letter, please call our office to schedule the follow-up appointment.   Your physician recommends that you return for lab work today, fasting   Your physician has ordered the following tests: echocardiogram in the near future and lower extremity arterial dopplers, carotid dopplers, and abdominal dopplers in April 2015

## 2013-02-16 NOTE — Assessment & Plan Note (Signed)
On statin therapy as well as Zetia. We will recheck a lipid and liver profile today

## 2013-02-16 NOTE — Progress Notes (Signed)
02/16/2013 Barry Lawrence   1945/10/01  [place]:[id number]  Primary Physician [name], MD Primary Cardiologist: [name] MD [name]   HPI:  The patient is a 67 year old moderately-overweight married Caucasian male, father of 2 and grandfather to 8 grandchildren, whom I last saw 6 months ago. He has a history of CAD, status post coronary artery bypass grafting in 1996 with a LIMA to his LAD, sequential vein to OM1 and 2 as well as acute marginal branch and diagonal branch. 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 [name] January 15, 2008. He has had both iliacs stented as well. He has a moderate-sized abdominal aortic aneurysm measuring 4.3 x 4.6 cm. We have been following this by duplex ultrasound. His other risk factors include hypertension, hyperlipidemia, and continued tobacco abuse of 2 packs a day, smoking cigars, recalcitrant to risk-factor modification. He has obstructive sleep apnea and is on CPAP. Since I saw him back in May he complains of increasing lower extremity edema, shortness of breath and claudication. He denies chest pain. He does continue to smoke and is recalcitrant to respect to modification. His lower Chumley Dopplers recently performed showed a left external iliac artery stenosis as well as an increase in size in his abdominal aortic aneurysm.     Current Outpatient Prescriptions  Medication Sig Dispense Refill  . amLODipine (NORVASC) 10 MG tablet Take 10 mg by mouth daily.      [name] atorvastatin (LIPITOR) 80 MG tablet Take 80 mg by mouth daily.      . carvedilol (COREG) 6.25 MG tablet Take 6.25 mg by mouth 2 (two) times daily with a meal.      . ezetimibe (ZETIA) 10 MG tablet TAKE 1 TABLET AT BEDTIME  90 tablet  2  . fenofibrate 160 MG tablet Take 160 mg by mouth daily.      [name] losartan-hydrochlorothiazide (HYZAAR) 100-25 MG per tablet Take 1 tablet by mouth daily.      [name] omeprazole (PRILOSEC) 20 MG  capsule Take 20 mg by mouth daily.       No current facility-administered medications for this visit.    Allergies  Allergen Reactions  . Niaspan [Niacin Er]     History   Social History  . Marital Status: Married    Spouse Name: N/A    Number of Children: 2  . Years of Education: N/A   Occupational History  . Not on file.   Social History Main Topics  . Smoking status: Current Every Day Smoker -- 2.00 packs/day    Types: Cigarettes  . Smokeless tobacco: Not on file  . Alcohol Use: Yes  . Drug Use: Not on file  . Sexual Activity: Not on file   Other Topics Concern  . Not on file   Social History Narrative  . No narrative on file     Review of Systems: General: negative for chills, fever, night sweats or weight changes.  Cardiovascular: negative for chest pain, dyspnea on exertion, edema, orthopnea, palpitations, paroxysmal nocturnal dyspnea or shortness of breath Dermatological: negative for rash Respiratory: negative for cough or wheezing Urologic: negative for hematuria Abdominal: negative for nausea, vomiting, diarrhea, bright red blood per rectum, melena, or hematemesis Neurologic: negative for visual changes, syncope, or dizziness All other systems reviewed and are otherwise negative except as noted above.    Blood pressure 170/90, pulse 57, height 5\' 7"  (1.702 m), weight 195 lb (88.451 kg).  General appearance:  alert and no distress Neck: no adenopathy, no JVD, supple, symmetrical, trachea midline, thyroid not enlarged, symmetric, no tenderness/mass/nodules and soft right carotid bruit Lungs: clear to auscultation bilaterally Heart: regular rate and rhythm, S1, S2 normal, no murmur, click, rub or gallop Extremities: 1+ bilateral lower extremity edema  EKG sinus bradycardia at 57 with right bundle-branch block. This is a new finding  ASSESSMENT AND PLAN:   CAD (coronary artery disease) Status post coronary bypass grafting in 1996 with a LIMA to his  LAD, sequential vein to OM1 and 2 postoperative acute marginal branch, a diagonal branch. Myoview performed 04/21/11 was nonischemic. He denies chest pain but does complain of dyspnea on exertion as well as new lower extremity edema. In addition, he has a new right bundle-branch block today. I'm going to get a 2-D echocardiogram to evaluate LV function.  PVD (peripheral vascular disease) Status post remote right carotid endarterectomy performed by Dr. Erskin Burnet 01/20/08. He also has bilateral iliac stenting by myself. He has a moderate size abdominal aortic aneurysm which has grown over the last 6 months from 4.3 x 4.6-4.8 x 4.9 he just complained of claudication bilaterally. 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 At this point he desires to continue to follow this conservatively. His abdominal aortic aneurysm is almost the size requiring revascularization. We will discuss this again in 6 months.  Essential hypertension, benign Borderline controlled. The patient does admit to dietary indiscretion. He says his blood pressure, somewhat lower than it was today in the office.  Hyperlipidemia On statin therapy as well as Zetia. We will recheck a lipid and liver profile today      [name] MD [facility], [facility] 02/16/2013 9:53 AM

## 2013-02-16 NOTE — Assessment & Plan Note (Signed)
Borderline controlled. The patient does admit to dietary indiscretion. He says his blood pressure, somewhat lower than it was today in the office.

## 2013-02-21 ENCOUNTER — Telehealth: Payer: Self-pay | Admitting: *Deleted

## 2013-02-21 DIAGNOSIS — R7989 Other specified abnormal findings of blood chemistry: Secondary | ICD-10-CM

## 2013-02-21 NOTE — Telephone Encounter (Signed)
Message copied by Chauncy Lean on Wed Feb 21, 2013 12:19 PM ------      Message from: [name]      Created: Sat Feb 17, 2013  3:34 PM       Excellent FLP. BMET suggests dehydration. Repeat 3-4 weeks ------

## 2013-03-06 ENCOUNTER — Ambulatory Visit (HOSPITAL_COMMUNITY)
Admission: RE | Admit: 2013-03-06 | Discharge: 2013-03-06 | Disposition: A | Discharge status: Home / Self Care | Payer: BC Managed Care – HMO | Source: Ambulatory Visit | Attending: Cardiovascular Disease | Admitting: Cardiovascular Disease

## 2013-03-06 DIAGNOSIS — R609 Edema, unspecified: Secondary | ICD-10-CM | POA: Insufficient documentation

## 2013-03-06 DIAGNOSIS — R0602 Shortness of breath: Secondary | ICD-10-CM | POA: Insufficient documentation

## 2013-03-06 DIAGNOSIS — R600 Localized edema: Secondary | ICD-10-CM

## 2013-03-06 NOTE — Progress Notes (Signed)
2D Echo Performed 03/06/2013    Barry Lawrence, RCS

## 2013-03-12 ENCOUNTER — Encounter: Payer: Self-pay | Admitting: *Deleted

## 2013-05-07 ENCOUNTER — Other Ambulatory Visit: Payer: Self-pay | Admitting: Gastroenterology

## 2013-07-06 ENCOUNTER — Other Ambulatory Visit: Payer: Self-pay | Admitting: Gastroenterology

## 2013-07-09 ENCOUNTER — Ambulatory Visit (HOSPITAL_COMMUNITY)
Admission: RE | Admit: 2013-07-09 | Discharge: 2013-07-09 | Disposition: A | Discharge status: Home / Self Care | Payer: BC Managed Care – PPO | Source: Ambulatory Visit | Attending: Gastroenterology | Admitting: Gastroenterology

## 2013-07-09 ENCOUNTER — Encounter (HOSPITAL_COMMUNITY)
Admission: RE | Disposition: A | Discharge status: Home / Self Care | Payer: Self-pay | Source: Ambulatory Visit | Attending: Gastroenterology

## 2013-07-09 ENCOUNTER — Encounter (HOSPITAL_COMMUNITY): Payer: Self-pay | Admitting: *Deleted

## 2013-07-09 DIAGNOSIS — F17200 Nicotine dependence, unspecified, uncomplicated: Secondary | ICD-10-CM | POA: Insufficient documentation

## 2013-07-09 DIAGNOSIS — Z951 Presence of aortocoronary bypass graft: Secondary | ICD-10-CM | POA: Insufficient documentation

## 2013-07-09 DIAGNOSIS — I739 Peripheral vascular disease, unspecified: Secondary | ICD-10-CM | POA: Insufficient documentation

## 2013-07-09 DIAGNOSIS — Z79899 Other long term (current) drug therapy: Secondary | ICD-10-CM | POA: Insufficient documentation

## 2013-07-09 DIAGNOSIS — K648 Other hemorrhoids: Secondary | ICD-10-CM | POA: Insufficient documentation

## 2013-07-09 DIAGNOSIS — I10 Essential (primary) hypertension: Secondary | ICD-10-CM | POA: Insufficient documentation

## 2013-07-09 DIAGNOSIS — I2510 Atherosclerotic heart disease of native coronary artery without angina pectoris: Secondary | ICD-10-CM | POA: Insufficient documentation

## 2013-07-09 DIAGNOSIS — G4733 Obstructive sleep apnea (adult) (pediatric): Secondary | ICD-10-CM | POA: Insufficient documentation

## 2013-07-09 DIAGNOSIS — E785 Hyperlipidemia, unspecified: Secondary | ICD-10-CM | POA: Insufficient documentation

## 2013-07-09 DIAGNOSIS — D126 Benign neoplasm of colon, unspecified: Secondary | ICD-10-CM | POA: Insufficient documentation

## 2013-07-09 DIAGNOSIS — K219 Gastro-esophageal reflux disease without esophagitis: Secondary | ICD-10-CM | POA: Insufficient documentation

## 2013-07-09 DIAGNOSIS — Z1211 Encounter for screening for malignant neoplasm of colon: Secondary | ICD-10-CM | POA: Insufficient documentation

## 2013-07-09 HISTORY — PX: COLONOSCOPY: SHX5424

## 2013-07-09 SURGERY — COLONOSCOPY
Anesthesia: Moderate Sedation

## 2013-07-09 MED ORDER — SODIUM CHLORIDE 0.9 % IV SOLN
INTRAVENOUS | Status: DC
Start: 2013-07-09 — End: 2013-07-09
  Administered 2013-07-09: 14:00:00 via INTRAVENOUS

## 2013-07-09 MED ORDER — FENTANYL CITRATE 0.05 MG/ML IJ SOLN
INTRAMUSCULAR | Status: AC
  Filled 2013-07-09: qty 4

## 2013-07-09 MED ORDER — MIDAZOLAM HCL 10 MG/2ML IJ SOLN
INTRAMUSCULAR | Status: DC | PRN
  Administered 2013-07-09: 1 mg via INTRAVENOUS
  Administered 2013-07-09 (×3): 2 mg via INTRAVENOUS

## 2013-07-09 MED ORDER — MIDAZOLAM HCL 10 MG/2ML IJ SOLN
INTRAMUSCULAR | Status: AC
  Filled 2013-07-09: qty 4

## 2013-07-09 MED ORDER — FENTANYL CITRATE 0.05 MG/ML IJ SOLN
INTRAMUSCULAR | Status: DC | PRN
  Administered 2013-07-09 (×3): 25 ug via INTRAVENOUS

## 2013-07-09 NOTE — Discharge Instructions (Signed)
Colonoscopy °Care After °These instructions give you information on caring for yourself after your procedure. Your doctor may also give you more specific instructions. Call your doctor if you have any problems or questions after your procedure. °HOME CARE °· Take it easy for the next 24 hours. °· Rest. °· Walk or use warm packs on your belly (abdomen) if you have belly cramping or gas. °· Do not drive for 24 hours. °· You may shower. °· Do not sign important papers or use machinery for 24 hours. °· Drink enough fluids to keep your pee (urine) clear or pale yellow. °· Resume your normal diet. Avoid heavy or fried foods. °· Avoid alcohol. °· Continue taking your normal medicines. °· Only take medicine as told by your doctor. Do not take aspirin. °If you had growths (polyps) removed: °· Do not take aspirin. °· Do not drink alcohol for 7 days or as told by your doctor. °· Eat a soft diet for 24 hours. °GET HELP RIGHT AWAY IF: °· You have a fever. °· You pass clumps of tissue (blood clots) or fill the toilet with blood. °· You have belly pain that gets worse and medicine does not help. °· Your belly is puffy (swollen). °· You feel sick to your stomach (nauseous) or throw up (vomit). °MAKE SURE YOU: °· Understand these instructions. °· Will watch your condition. °· Will get help right away if you are not doing well or get worse. °Document Released: 06/12/2010 Document Revised: 08/02/2011 Document Reviewed: 01/15/2013 °Exit[employer]® Patient Information ©2014 [employer]. ° °

## 2013-07-09 NOTE — H&P (Signed)
Barry Lawrence is an 68 y.o. male.   Chief Complaint: Colorectal cancer screening. HPI: Patient is here for a colonoscopy. Please refer to office notes for details.  Past Medical History  Diagnosis Date  . CAD (coronary artery disease)     myoview 04/21/11-normal, EF49%  . S/P CABG (coronary artery bypass graft) 1996  . Hypertension   . Hyperlipemia   . Carotid stenosis 01/15/08    right endarterectomy-Dr. Amedeo Plenty  . PAD (peripheral artery disease) 06/12/09    11/22/07 PTA & stenting right external iliac artery;bilateral iliac & PTI and stenting 1997;right ICA =>50% reduction,right SFA >50%,left ICA at stent => 50% reduction,left EIA => 50% reduction,left ATA occluded, left SFA mid > 49% reduction  . Abdominal aortic aneurysm 01/28/10    4.3x4.6cm  . Polycythemia     H/O  . GERD (gastroesophageal reflux disease)   . Tobacco abuse   . OSA on CPAP    Past Surgical History  Procedure Laterality Date  . Coronary artery bypass graft  1996    LIMA to LAD, sequential vein to OM1 and 2 as well as acure marginal branch and diag branch  . Carotid endarterectomy  01/15/08  . Lumbar disc surgery  1998  . Pv angio  11/22/2007    PTA/ stenting of right common iliac artery with a 10x13mm Smart stent and postdilated with a 7x59mmpowerflex balloon; high grade calcified stenosis of the RICA   Family History  Problem Relation Age of Onset  . Heart failure Father    Social History:  reports that he has been smoking Cigarettes.  He has been smoking about 2.00 packs per day. He does not have any smokeless tobacco history on file. He reports that he does not drink alcohol or use illicit drugs.  Allergies:  Allergies  Allergen Reactions  . Niaspan [Niacin Er]    Medications Prior to Admission  Medication Sig Dispense Refill  . amLODipine (NORVASC) 10 MG tablet Take 10 mg by mouth daily.      [name] atorvastatin (LIPITOR) 80 MG tablet Take 80 mg by mouth daily.      . carvedilol (COREG) 6.25 MG tablet Take  6.25 mg by mouth 2 (two) times daily with a meal.      . ezetimibe (ZETIA) 10 MG tablet TAKE 1 TABLET AT BEDTIME  90 tablet  2  . fenofibrate 160 MG tablet Take 160 mg by mouth daily.      [name] losartan-hydrochlorothiazide (HYZAAR) 100-25 MG per tablet Take 1 tablet by mouth daily.      [name] omeprazole (PRILOSEC) 20 MG capsule Take 20 mg by mouth daily.       No results found for this or any previous visit (from the past 48 hour(s)). No results found.  Review of Systems  Constitutional: Negative.   Eyes: Negative.   Respiratory: Negative.   Cardiovascular: Negative.   Gastrointestinal: Negative.   Genitourinary: Negative.   Musculoskeletal: Negative.   Skin: Negative.   Neurological: Positive for headaches.  Endo/Heme/Allergies: Negative.   Psychiatric/Behavioral: Negative.    Blood pressure 178/100, pulse 77, temperature 97.8 F (36.6 C), temperature source Oral, resp. rate 18, height 5\' 7"  (1.702 m), weight 86.183 kg (190 lb), SpO2 95.00%. Physical Exam  Constitutional: He is oriented to person, place, and time. He appears well-developed and well-nourished.  HENT:  Head: Normocephalic and atraumatic.  Eyes: Conjunctivae and EOM are normal. Pupils are equal, round, and reactive to light.  Neck: Normal range of motion. Neck supple.  Cardiovascular: Normal rate and regular rhythm.   Respiratory: Effort normal and breath sounds normal.  GI: Soft. Bowel sounds are normal.  Musculoskeletal: Normal range of motion.  Neurological: He is alert and oriented to person, place, and time.  Skin: Skin is warm and dry.  Psychiatric: He has a normal mood and affect. His behavior is normal. Judgment and thought content normal.   Assessment/Plan Colorectal cancer screening: proceed with a colonoscopy at this time.  Barry Lawrence 07/09/2013, 2:29 PM

## 2013-07-09 NOTE — Op Note (Signed)
[place], [id number]   OPERATIVE PROCEDURE REPORT  PATIENT: Barry Lawrence, Barry Lawrence  MR#: [id number] BIRTHDATE: [birth year] GENDER: Male ENDOSCOPIST: Dr.  Edmonia James, MD ASSISTANT:   Sharon Mt, Endo Technician Ward Chatters, RN, BSN  PROCEDURE DATE: 07/09/2013 PRE-PROCEDURE PREPARATION: The patient was prepped with 2 dulcolax tablets, one ten-ounce bottle of magnesium citrate, and a gallon of Golytely the night prior to the procedure.  The patient was fasted for 8 hours prior to the procedure.  PRE-PROCEDURE PHYSICAL: Patient has stable vital signs.  Neck is supple.  There is no JVD, thyromegaly or LAD.  Chest clear to auscultation.  S1 and S2 regular.  Abdomen soft, non-distended, non-tender with NABS. PROCEDURE:     Colonoscopy with biopsy and snare polypectomy ASA CLASS:     Class III INDICATIONS:     1.  Colorectal cancer screening. MEDICATIONS:     Fentanyl 75 mcg IV and Versed 7 mg IV  DESCRIPTION OF PROCEDURE:   After the risks, benefits, and alternatives of the procedure were thoroughly explained [including a 10% missed rate of cancer and polyps], informed consent was obtained.  Digital rectal exam was performed.  The Pentax Ultra Slim [id number]  was introduced through the anus  and advanced to the cecum, which was identified by both the appendix and ileocecal valve , limited by No adverse events experienced.   The quality of the prep was good. . Multiple washes were done. Small lesions could be missed. The instrument was then slowly withdrawn as the colon was fully examined.     COLON FINDINGS: Two dimunitive polyps were found in the ascending colon and 2 in the left colon-these were all removed by cold biopsies x 4. Two 6-7 mm sessile polyps were found in the right colon and 2 were found in the left colon-these were removed by hot snare x 4-200/20. The rest of the colonic mucosa appeared healthy with a normal vascular  pattern. No masses, diverticula or AVMs were noted. The appendiceal orifice and the ICV were identified and photographed. Retroflexed views revealed small internal hemorrhoids. The patient tolerated the procedure without immediate complications.  The scope was then withdrawn from the patient and the procedure terminated.  TIME TO CECUM:   03 minutes 00 seconds WITHDRAW TIME:  08 minutes 00 seconds  IMPRESSION:     1) Multiple polyps removed-see description above for details. 2) Small internal hemorrhoids noted on retroflexion.  RECOMMENDATIONS:     1.  Hold aspirin, aspirin products, and anti-inflammatory medication for 2 weeks. 2.  Await pathology results. 3. OP follow up PRN.   REPEAT EXAM:      In 3-5 years; if the patient has any abnormal GI symptoms in the interim, he have been advised to contact the office as soon as possible for further recommendations.   CPT CODES:     [id number]; [id number]   DIAGNOSIS CODES:     V76.51, V12.72, 211.3   REFERRED BY:  eSigned:  Dr. Edmonia James, MD 07/09/2013 3:16 PM   PATIENT NAME:  [name], [name] MR#: [id number]

## 2013-07-10 ENCOUNTER — Encounter (HOSPITAL_COMMUNITY): Payer: Self-pay | Admitting: Gastroenterology

## 2013-07-24 ENCOUNTER — Ambulatory Visit (HOSPITAL_COMMUNITY)
Admission: RE | Admit: 2013-07-24 | Discharge: 2013-07-24 | Disposition: A | Discharge status: Home / Self Care | Payer: BC Managed Care – PPO | Source: Ambulatory Visit | Attending: Cardiovascular Disease | Admitting: Cardiovascular Disease

## 2013-07-24 DIAGNOSIS — I714 Abdominal aortic aneurysm, without rupture, unspecified: Secondary | ICD-10-CM | POA: Insufficient documentation

## 2013-07-24 DIAGNOSIS — I739 Peripheral vascular disease, unspecified: Secondary | ICD-10-CM

## 2013-07-24 NOTE — Progress Notes (Signed)
Lower Extremity Arterial Duplex Completed. °Brianna L Mazza,RVT °

## 2013-07-24 NOTE — Progress Notes (Signed)
Abdominal Aortic Duplex Completed °Brianna L Mazza,RVT °

## 2013-07-29 ENCOUNTER — Telehealth: Payer: Self-pay | Admitting: *Deleted

## 2013-07-29 ENCOUNTER — Encounter: Payer: Self-pay | Admitting: *Deleted

## 2013-07-29 DIAGNOSIS — I739 Peripheral vascular disease, unspecified: Secondary | ICD-10-CM

## 2013-07-29 DIAGNOSIS — I714 Abdominal aortic aneurysm, without rupture, unspecified: Secondary | ICD-10-CM

## 2013-07-29 NOTE — Telephone Encounter (Signed)
Message copied by Chauncy Lean on Sun Jul 29, 2013 11:05 PM ------      Message from: [name]      Created: Sun Jul 29, 2013  5:56 PM       No change from prior study. Repeat in 6 months ------

## 2013-07-29 NOTE — Telephone Encounter (Signed)
Order placed for repeat lower extremity arterial doppler and AAA doppler in 6 months

## 2013-07-31 ENCOUNTER — Ambulatory Visit (HOSPITAL_COMMUNITY)
Admission: RE | Admit: 2013-07-31 | Discharge: 2013-07-31 | Disposition: A | Discharge status: Home / Self Care | Payer: BC Managed Care – PPO | Source: Ambulatory Visit | Attending: Cardiovascular Disease | Admitting: Cardiovascular Disease

## 2013-07-31 DIAGNOSIS — I739 Peripheral vascular disease, unspecified: Secondary | ICD-10-CM

## 2013-07-31 NOTE — Progress Notes (Signed)
Carotid Duplex Completed. °Brianna L Mazza,RVT °

## 2013-08-06 ENCOUNTER — Telehealth: Payer: Self-pay | Admitting: *Deleted

## 2013-08-06 ENCOUNTER — Encounter: Payer: Self-pay | Admitting: *Deleted

## 2013-08-06 DIAGNOSIS — I6529 Occlusion and stenosis of unspecified carotid artery: Secondary | ICD-10-CM

## 2013-08-06 NOTE — Telephone Encounter (Signed)
Order placed for repeat carotid dopplers in 1 year  

## 2013-08-06 NOTE — Telephone Encounter (Signed)
Message copied by Chauncy Lean on Mon Aug 06, 2013  1:48 PM ------      Message from: [name]      Created: Sun Aug 05, 2013  6:49 PM       No change from prior study. Repeat in 12 months. ------

## 2013-10-19 ENCOUNTER — Other Ambulatory Visit: Payer: Self-pay | Admitting: *Deleted

## 2013-10-19 MED ORDER — EZETIMIBE 10 MG PO TABS: ORAL_TABLET | ORAL | Status: DC

## 2013-10-19 NOTE — Telephone Encounter (Signed)
Rx refill sent to patient pharmacy   

## 2014-01-31 ENCOUNTER — Ambulatory Visit (HOSPITAL_BASED_OUTPATIENT_CLINIC_OR_DEPARTMENT_OTHER)
Admission: RE | Admit: 2014-01-31 | Discharge: 2014-01-31 | Disposition: A | Discharge status: Home / Self Care | Payer: BC Managed Care – PPO | Source: Ambulatory Visit | Attending: Cardiovascular Disease | Admitting: Cardiovascular Disease

## 2014-01-31 ENCOUNTER — Ambulatory Visit (HOSPITAL_COMMUNITY)
Admission: RE | Admit: 2014-01-31 | Discharge: 2014-01-31 | Disposition: A | Discharge status: Home / Self Care | Payer: BC Managed Care – PPO | Source: Ambulatory Visit | Attending: Cardiovascular Disease | Admitting: Cardiovascular Disease

## 2014-01-31 DIAGNOSIS — I714 Abdominal aortic aneurysm, without rupture, unspecified: Secondary | ICD-10-CM

## 2014-01-31 DIAGNOSIS — R9439 Abnormal result of other cardiovascular function study: Secondary | ICD-10-CM | POA: Insufficient documentation

## 2014-01-31 DIAGNOSIS — I739 Peripheral vascular disease, unspecified: Secondary | ICD-10-CM | POA: Insufficient documentation

## 2014-01-31 NOTE — Progress Notes (Signed)
Lower Extremity Arterial Duplex Completed. °Brianna L Mazza,RVT °

## 2014-01-31 NOTE — Progress Notes (Signed)
Abdominal Aortic Duplex Completed °Brianna L Mazza,RVT °

## 2014-02-11 ENCOUNTER — Telehealth: Payer: Self-pay | Admitting: Cardiovascular Disease

## 2014-02-11 NOTE — Telephone Encounter (Signed)
Received call from patient he stated he was at [facility] in [facility] and [place] wanted ABI's from recent lower ext doppler.ABI's reported from 01/31/14 lower ext doppler.

## 2014-02-19 ENCOUNTER — Encounter: Payer: Self-pay | Admitting: Cardiovascular Disease

## 2014-02-19 ENCOUNTER — Ambulatory Visit (INDEPENDENT_AMBULATORY_CARE_PROVIDER_SITE_OTHER): Payer: BC Managed Care – PPO | Admitting: Cardiovascular Disease

## 2014-02-19 VITALS — BP 155/84 | HR 65 | Ht 67.0 in | Wt 196.0 lb

## 2014-02-19 DIAGNOSIS — E785 Hyperlipidemia, unspecified: Secondary | ICD-10-CM

## 2014-02-19 DIAGNOSIS — I739 Peripheral vascular disease, unspecified: Secondary | ICD-10-CM

## 2014-02-19 DIAGNOSIS — N289 Disorder of kidney and ureter, unspecified: Secondary | ICD-10-CM

## 2014-02-19 DIAGNOSIS — I10 Essential (primary) hypertension: Secondary | ICD-10-CM

## 2014-02-19 DIAGNOSIS — I6529 Occlusion and stenosis of unspecified carotid artery: Secondary | ICD-10-CM

## 2014-02-19 DIAGNOSIS — I2510 Atherosclerotic heart disease of native coronary artery without angina pectoris: Secondary | ICD-10-CM

## 2014-02-19 DIAGNOSIS — I714 Abdominal aortic aneurysm, without rupture, unspecified: Secondary | ICD-10-CM

## 2014-02-19 NOTE — Assessment & Plan Note (Signed)
Left carotid Doppler performed in March of this year did not show significant ICA stenosis.

## 2014-02-19 NOTE — Progress Notes (Signed)
02/19/2014 Barry Lawrence   06/14/1945  [place]:[id number]  Primary Physician [name], MD Primary Cardiologist: [name] MD [name]   HPI:  The patient is a 68 year old moderately-overweight married Caucasian male, father of 2 and grandfather to 8 grandchildren, whom I last saw 12 months ago. He has a history of CAD, status post coronary artery bypass grafting in 1996 with a LIMA to his LAD, sequential vein to OM1 and 2 as well as acute marginal branch and diagonal branch. 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 [name] January 15, 2008. Carotid Dopplers performed in March of this year showed a widely patent endarterectomy site. He has had both iliacs stented as well as his SFAs in the past.. He has a large -sized abdominal aortic aneurysm measuring 5.5 x 5.5cm. We have been following this by duplex ultrasound. His other risk factors include hypertension, hyperlipidemia, and continued tobacco abuse of 2 packs a day, smoking cigars, recalcitrant to risk-factor modification. He has obstructive sleep apnea and is on CPAP.  Since I saw him back in May he complains of increasing lower extremity edema, shortness of breath and claudication Since I saw him back a year ago he complained of increasing shortness of breath but denies chest pain. He apparently did have a Myoview stress test a year ago at the [facility].he does continue to complain of claudication although his ABI is really haven't changed and the stents appear to be open  Current Outpatient Prescriptions  Medication Sig Dispense Refill  . amLODipine (NORVASC) 10 MG tablet Take 10 mg by mouth daily.      [name] atorvastatin (LIPITOR) 80 MG tablet Take 80 mg by mouth daily.      . carvedilol (COREG) 6.25 MG tablet Take 6.25 mg by mouth 2 (two) times daily with a meal.      . ezetimibe (ZETIA) 10 MG tablet TAKE 1 TABLET AT BEDTIME  90 tablet  2  . fenofibrate 160 MG  tablet Take 160 mg by mouth daily.      [name] losartan-hydrochlorothiazide (HYZAAR) 100-25 MG per tablet Take 1 tablet by mouth daily.      [name] omeprazole (PRILOSEC) 20 MG capsule Take 20 mg by mouth daily.      . tamsulosin (FLOMAX) 0.4 MG CAPS capsule Take 0.4 mg by mouth daily after supper.       [name] VIAGRA 100 MG tablet Take 100 mg by mouth as needed.        No current facility-administered medications for this visit.    Allergies  Allergen Reactions  . Niaspan [Niacin Er]     History   Social History  . Marital Status: Married    Spouse Name: N/A    Number of Children: 2  . Years of Education: N/A   Occupational History  . Not on file.   Social History Main Topics  . Smoking status: Current Every Day Smoker -- 2.00 packs/day    Types: Cigarettes  . Smokeless tobacco: Not on file  . Alcohol Use: No  . Drug Use: No  . Sexual Activity: Not on file   Other Topics Concern  . Not on file   Social History Narrative  . No narrative on file     Review of Systems: General: negative for chills, fever, night sweats or weight changes.  Cardiovascular: negative for chest pain, dyspnea on exertion, edema, orthopnea, palpitations, paroxysmal nocturnal dyspnea or shortness  of breath Dermatological: negative for rash Respiratory: negative for cough or wheezing Urologic: negative for hematuria Abdominal: negative for nausea, vomiting, diarrhea, bright red blood per rectum, melena, or hematemesis Neurologic: negative for visual changes, syncope, or dizziness All other systems reviewed and are otherwise negative except as noted above.    Blood pressure 155/84, pulse 65, height 5\' 7"  (1.702 m), weight 196 lb (88.905 kg).  General appearance: alert and no distress Neck: no adenopathy, no carotid bruit, no JVD, supple, symmetrical, trachea midline and thyroid not enlarged, symmetric, no tenderness/mass/nodules Lungs: clear to auscultation bilaterally Heart: regular rate and rhythm, S1,  S2 normal, no murmur, click, rub or gallop Extremities: extremities normal, atraumatic, no cyanosis or edema  EKG not performed today  ASSESSMENT AND PLAN:   Abdominal aortic aneurysm We have been following his abdominal aortic aneurysm because ultrasound on an annual basis. It has grown a centimeter over the past year now measuring 5.5 cm. It is at the point where and refer him to Dr. [name] for consideration of revascularization. He does have bilateral iliac stents. His serum creatinine is 1.75 to perform CT angiography. He may be a candidate for endoluminal stent grafting.   Hyperlipidemia On statin therapy with recent lipid profile performed at the [facility] revealing a total cholesterol of 119, LDL of 59 and HDL of 24.  Renal insufficiency, mild The serum creatinine has increased from 1.64 a year ago to 1.75. This is being evaluated at the [facility]  Essential hypertension, benign Controlled on current medications  PVD (peripheral vascular disease) History of PTA of his iliac arteries back in 1998 as was his SFA with restenting of his right external iliac artery in 2009. He does complain of lifestyle limitingclaudication. His lower extremity arterial Dopplers performed 01/31/14 revealed ABIs in the .75 range bilaterally.  CAD (coronary artery disease) History of CAD status post coronary bypass grafting in 1996 with LIMA to his LAD, sequential vein to OM1 and 2, acute marginal branch and diagonal branch. He denies chest pain but is chronically short of breath probably related to COPD. He apparently had a nuclear stress test at [facility] a year ago  Occlusion and stenosis of carotid artery without mention of cerebral infarction Left carotid Doppler performed in March of this year did not show significant ICA stenosis.      [name] MD FACP,FACC,FAHA, [facility] 02/19/2014 9:17 AM

## 2014-02-19 NOTE — Patient Instructions (Signed)
  We will see you back in follow up in 1 year with Dr Gwenlyn Found  Dr Gwenlyn Found has ordered: Dr Gwenlyn Found has referred you to Dr [name] (the vascular surgeon) for evaluation of your AAA.

## 2014-02-19 NOTE — Assessment & Plan Note (Signed)
On statin therapy with recent lipid profile performed at the [facility] revealing a total cholesterol of 119, LDL of 59 and HDL of 24.

## 2014-02-19 NOTE — Assessment & Plan Note (Signed)
The serum creatinine has increased from 1.64 a year ago to 1.75. This is being evaluated at the [facility]

## 2014-02-19 NOTE — Assessment & Plan Note (Signed)
We have been following his abdominal aortic aneurysm because ultrasound on an annual basis. It has grown a centimeter over the past year now measuring 5.5 cm. It is at the point where and refer him to Dr. [name] for consideration of revascularization. He does have bilateral iliac stents. His serum creatinine is 1.75 to perform CT angiography. He may be a candidate for endoluminal stent grafting.

## 2014-02-19 NOTE — Assessment & Plan Note (Signed)
History of PTA of his iliac arteries back in 1998 as was his SFA with restenting of his right external iliac artery in 2009. He does complain of lifestyle limitingclaudication. His lower extremity arterial Dopplers performed 01/31/14 revealed ABIs in the .75 range bilaterally.

## 2014-02-19 NOTE — Assessment & Plan Note (Signed)
History of CAD status post coronary bypass grafting in 1996 with LIMA to his LAD, sequential vein to OM1 and 2, acute marginal branch and diagonal branch. He denies chest pain but is chronically short of breath probably related to COPD. He apparently had a nuclear stress test at [facility] a year ago

## 2014-02-19 NOTE — Assessment & Plan Note (Signed)
Controlled on current medications 

## 2014-02-26 ENCOUNTER — Encounter: Payer: Self-pay | Admitting: Surgery

## 2014-03-04 ENCOUNTER — Other Ambulatory Visit: Payer: Self-pay

## 2014-03-04 DIAGNOSIS — I714 Abdominal aortic aneurysm, without rupture, unspecified: Secondary | ICD-10-CM

## 2014-03-05 ENCOUNTER — Other Ambulatory Visit: Payer: Self-pay | Admitting: Surgery

## 2014-03-05 ENCOUNTER — Ambulatory Visit (HOSPITAL_COMMUNITY)
Admission: RE | Admit: 2014-03-05 | Discharge: 2014-03-05 | Disposition: A | Discharge status: Home / Self Care | Payer: BC Managed Care – PPO | Source: Ambulatory Visit | Attending: Surgery | Admitting: Surgery

## 2014-03-05 ENCOUNTER — Encounter: Payer: Self-pay | Admitting: Surgery

## 2014-03-05 DIAGNOSIS — K8080 Other cholelithiasis without obstruction: Secondary | ICD-10-CM | POA: Insufficient documentation

## 2014-03-05 DIAGNOSIS — Z951 Presence of aortocoronary bypass graft: Secondary | ICD-10-CM | POA: Diagnosis not present

## 2014-03-05 DIAGNOSIS — I714 Abdominal aortic aneurysm, without rupture, unspecified: Secondary | ICD-10-CM

## 2014-03-05 DIAGNOSIS — N400 Enlarged prostate without lower urinary tract symptoms: Secondary | ICD-10-CM | POA: Insufficient documentation

## 2014-03-05 DIAGNOSIS — I712 Thoracic aortic aneurysm, without rupture: Secondary | ICD-10-CM | POA: Diagnosis not present

## 2014-03-05 DIAGNOSIS — R911 Solitary pulmonary nodule: Secondary | ICD-10-CM | POA: Diagnosis not present

## 2014-03-05 DIAGNOSIS — D1779 Benign lipomatous neoplasm of other sites: Secondary | ICD-10-CM | POA: Diagnosis not present

## 2014-03-05 DIAGNOSIS — M47896 Other spondylosis, lumbar region: Secondary | ICD-10-CM | POA: Diagnosis not present

## 2014-03-05 LAB — POCT I-STAT CREATININE: Creatinine, Ser: 2 mg/dL — ABNORMAL HIGH (ref 0.50–1.35)

## 2014-03-05 MED ORDER — IOHEXOL 350 MG/ML SOLN: 120.0000 mL | Freq: Once | INTRAVENOUS | Status: AC | PRN

## 2014-03-06 ENCOUNTER — Encounter: Payer: BC Managed Care – PPO | Admitting: Surgery

## 2014-03-15 ENCOUNTER — Encounter: Payer: Self-pay | Admitting: Surgery

## 2014-03-18 ENCOUNTER — Encounter: Payer: Self-pay | Admitting: Surgery

## 2014-03-18 ENCOUNTER — Ambulatory Visit (INDEPENDENT_AMBULATORY_CARE_PROVIDER_SITE_OTHER): Payer: BC Managed Care – PPO | Admitting: Surgery

## 2014-03-18 VITALS — BP 151/77 | HR 63 | Temp 98.4°F | Resp 16 | Ht 67.0 in | Wt 197.5 lb

## 2014-03-18 DIAGNOSIS — I714 Abdominal aortic aneurysm, without rupture, unspecified: Secondary | ICD-10-CM | POA: Insufficient documentation

## 2014-03-18 DIAGNOSIS — Z01810 Encounter for preprocedural cardiovascular examination: Secondary | ICD-10-CM

## 2014-03-18 DIAGNOSIS — I6529 Occlusion and stenosis of unspecified carotid artery: Secondary | ICD-10-CM

## 2014-03-18 NOTE — Progress Notes (Signed)
Patient name: Barry Lawrence MRN: [id number] DOB: 02-Dec-1945 Sex: male   Referred by: Dr. Gwenlyn Found  Reason for referral:  Chief Complaint  Patient presents with  . AAA    REF- Dr. Gwenlyn Found for AAA    HISTORY OF PRESENT ILLNESS: This is a 68 year old gentleman who is referred today for evaluation of an abdominal aortic aneurysm.  This has been followed with serial ultrasound.  It increased to 5.5 cm.  He had a noncontrasted CT scan given his renal dysfunction.  This showed a maximum diameter of 5.6 cm.  He denies any abdominal pain or back pain.  The patient has a history of coronary artery disease.  He is status post CABG in 1996.  He had a Myoview approximately 1 year ago at the [facility].  He is also status post right carotid endarterectomy by Dr. Amedeo Plenty in 2009..  The patient has bilateral claudication.  He has undergone iliac stenting bilaterally.  He still complains of trouble walking.  The patient suffers from hypercholesterolemia which is managed with a statin.  He is on multiple medications for hypertension.  He continues to smoke approximately 2 packs a day.  His shortness of breath has been fairly stable.  Past Medical History  Diagnosis Date  . CAD (coronary artery disease)     myoview 04/21/11-normal, EF49%  . S/P CABG (coronary artery bypass graft) 1996  . Hypertension   . Hyperlipemia   . Carotid stenosis 01/15/08    right endarterectomy-Dr. Amedeo Plenty  . PAD (peripheral artery disease) 06/12/09    11/22/07 PTA & stenting right external iliac artery;bilateral iliac & PTI and stenting 1997;right ICA =>50% reduction,right SFA >50%,left ICA at stent => 50% reduction,left EIA => 50% reduction,left ATA occluded, left SFA mid > 49% reduction  . Abdominal aortic aneurysm 01/28/10    4.3x4.6cm  . Polycythemia     H/O  . GERD (gastroesophageal reflux disease)   . Tobacco abuse   . OSA on CPAP     Past Surgical History  Procedure Laterality Date  . Coronary artery bypass graft  1996      LIMA to LAD, sequential vein to OM1 and 2 as well as acure marginal branch and diag branch  . Carotid endarterectomy  01/15/08  . Lumbar disc surgery  1998  . Pv angio  11/22/2007    PTA/ stenting of right common iliac artery with a 10x35mm Smart stent and postdilated with a 7x3mmpowerflex balloon; high grade calcified stenosis of the RICA  . Colonoscopy N/A 07/09/2013    Procedure: COLONOSCOPY;  Surgeon: Juanita Craver, MD;  Location: [facility];  Service: Endoscopy;  Laterality: N/A;    History   Social History  . Marital Status: Married    Spouse Name: N/A    Number of Children: 2  . Years of Education: N/A   Occupational History  . Not on file.   Social History Main Topics  . Smoking status: Current Every Day Smoker -- 2.00 packs/day    Types: Cigarettes  . Smokeless tobacco: Not on file  . Alcohol Use: No  . Drug Use: No  . Sexual Activity: Not on file   Other Topics Concern  . Not on file   Social History Narrative  . No narrative on file    Family History  Problem Relation Age of Onset  . Heart failure Father     Allergies as of 03/18/2014 - Review Complete 03/18/2014  Allergen Reaction Noted  . Niaspan [niacin  er]  07/15/2012    Current Outpatient Prescriptions on File Prior to Visit  Medication Sig Dispense Refill  . amLODipine (NORVASC) 10 MG tablet Take 10 mg by mouth daily.      [name] atorvastatin (LIPITOR) 80 MG tablet Take 80 mg by mouth daily.      . carvedilol (COREG) 6.25 MG tablet Take 6.25 mg by mouth 2 (two) times daily with a meal.      . ezetimibe (ZETIA) 10 MG tablet TAKE 1 TABLET AT BEDTIME  90 tablet  2  . fenofibrate 160 MG tablet Take 160 mg by mouth daily.      [name] losartan-hydrochlorothiazide (HYZAAR) 100-25 MG per tablet Take 1 tablet by mouth daily.      [name] omeprazole (PRILOSEC) 20 MG capsule Take 20 mg by mouth daily.      . tamsulosin (FLOMAX) 0.4 MG CAPS capsule Take 0.4 mg by mouth daily after supper.       [name] VIAGRA 100 MG tablet  Take 100 mg by mouth as needed.        No current facility-administered medications on file prior to visit.     REVIEW OF SYSTEMS: Cardiovascular: No chest pain, chest pressure, palpitations, orthopnea, or dyspnea on exertion.  Positive for pain in legs with walking.,  No history of DVT or phlebitis. Pulmonary: No productive cough, asthma.    wheezing. Neurologic: No weakness, paresthesias, aphasia, or amaurosis. No dizziness. Hematologic: No bleeding problems or clotting disorders. Musculoskeletal: No joint pain or joint swelling. Gastrointestinal: No blood in stool or hematemesis Genitourinary: No dysuria or hematuria. Psychiatric:: No history of major depression. Integumentary: No rashes or ulcers. Constitutional: No fever or chills.  PHYSICAL EXAMINATION: General: The patient appears their stated age.  Vital signs are BP 151/77  Pulse 63  Temp(Src) 98.4 F (36.9 C) (Oral)  Resp 16  Ht 5\' 7"  (1.702 m)  Wt 197 lb 8 oz (89.585 kg)  BMI 30.93 kg/m2  SpO2 97% HEENT:  No gross abnormalities Pulmonary: Respirations are non-labored Abdomen: Soft and non-tender Aorta is easily palpable and nontender  Musculoskeletal: There are no major deformities.   Neurologic: No focal weakness or paresthesias are detected, Skin: There are no ulcer or rashes noted. Psychiatric: The patient has normal affect. Cardiovascular: There is a regular rate and rhythm without significant murmur appreciated.Nonpalpable pedal pulses.  No carotid bruits.  Diagnostic Studies: CT scan has been reviewed.  This was a noncontrasted scan.  Maximum diameter 5.6 cm    Assessment:  Abdominal aortic aneurysm Plan: The patient has a 5.6 cm infrarenal abdominal aortic aneurysm.  His aortic neck is suitable for endovascular repair, however he has stents within his bilateral iliac arteries which may make this problematic.  They appear to be very small on CT scan.  I'm not sure if I can get the device through the  stents.  His CT scan was noncontrasted, and therefore I don't have a great evaluation of the size of the stents luminal patency.  Therefore, I feel that he needs to undergo angiography to better define his anatomy.  He will need an AP abdominal aortogram followed by a LAO and RAO pelvic angiogram.  I'll discuss this with Dr. Gwenlyn Found and get this performed for the next 3 weeks.  I'm also sending him for pulmonary clearance in case he needs an open repair.     Eldridge Abrahams, M.D. [facility] Office: [phone] Pager:  [phone]

## 2014-03-18 NOTE — Patient Instructions (Signed)
Please review the tobacco cessation information given to you today. It lists many hints that are useful in your effort to stop smoking. The [facility] Tobacco Cessation contact phone # is [phone] These nurses and advisors offer lots of FREE information and aids to help you quit.    The [facility] Quit Smoking line #  800-784-8669, they will also assist you with programs designed to help you stop smoking.    

## 2014-03-19 ENCOUNTER — Telehealth: Payer: Self-pay | Admitting: *Deleted

## 2014-03-19 DIAGNOSIS — I739 Peripheral vascular disease, unspecified: Secondary | ICD-10-CM

## 2014-03-19 NOTE — Telephone Encounter (Signed)
Message copied by Chauncy Lean on Tue Mar 19, 2014 12:24 PM ------      Message from: [name]      Created: Mon Mar 18, 2014  2:54 PM      Regarding: RE: PV angio       Please bring patient back for an updated H&P. He needs to be brought in for an abdominal aortogram prior to endoluminal stent grafting by Dr. [name].            JJB      ----- Message -----         From: Chauncy Lean, RN         Sent: 03/18/2014   1:59 PM           To: [name], MD      Subject: PV angio                                                 His creatinine is elevated.  Do you want to admit the night before for hydration?  He was last seen at the end of September.  Do you want to bring him back in for a PA visit or can they just update an H and P at the hospital when he is admitted?       ------

## 2014-03-19 NOTE — Telephone Encounter (Signed)
I called patient to set up the angiogram.  He was very reluctant to come back in for an updated Hand P since he was just seen by JB in Sept and by Dr [name] this week. I got him set up to be admitted Sunday, November 8 for admission for hydration and procedure on November 9.  Patient is agreeable and verbalized understanding.  We will not make him come in for an updated H and P.

## 2014-03-22 ENCOUNTER — Encounter (HOSPITAL_COMMUNITY): Payer: Self-pay | Admitting: Pharmacy Technician

## 2014-03-26 ENCOUNTER — Ambulatory Visit (INDEPENDENT_AMBULATORY_CARE_PROVIDER_SITE_OTHER): Payer: BC Managed Care – PPO | Admitting: Internal Medicine

## 2014-03-26 ENCOUNTER — Encounter: Payer: Self-pay | Admitting: Internal Medicine

## 2014-03-26 VITALS — BP 160/90 | HR 67 | Ht 67.0 in | Wt 201.0 lb

## 2014-03-26 DIAGNOSIS — R0600 Dyspnea, unspecified: Secondary | ICD-10-CM

## 2014-03-26 DIAGNOSIS — R911 Solitary pulmonary nodule: Secondary | ICD-10-CM

## 2014-03-26 DIAGNOSIS — J449 Chronic obstructive pulmonary disease, unspecified: Secondary | ICD-10-CM

## 2014-03-26 DIAGNOSIS — I6529 Occlusion and stenosis of unspecified carotid artery: Secondary | ICD-10-CM

## 2014-03-26 DIAGNOSIS — Z720 Tobacco use: Secondary | ICD-10-CM

## 2014-03-26 DIAGNOSIS — F17210 Nicotine dependence, cigarettes, uncomplicated: Secondary | ICD-10-CM

## 2014-03-26 LAB — PULMONARY FUNCTION TEST
FEF 25-75 Pre: 0.89 L/sec
FEF2575-%Pred-Pre: 38 %
FEV1-%Pred-Pre: 51 %
FEV1-Pre: 1.53 L
FEV1FVC-%Pred-Pre: 92 %
FEV6-%Pred-Pre: 58 %
FEV6-Pre: 2.22 L
FEV6FVC-%Pred-Pre: 106 %
FVC-%Pred-Pre: 55 %
FVC-Pre: 2.23 L
Pre FEV1/FVC ratio: 69 %
Pre FEV6/FVC Ratio: 100 %

## 2014-03-26 NOTE — Progress Notes (Signed)
Subjective:    Patient ID: Barry Lawrence, male    DOB: 25-Jan-1946  MRN: [id number]  HPI  52 yowm active smoker followed by [facility] for OSA being considered for AAA surgery and sent for preop eval by Dr [name] to pulmonary clinic 03/26/2014    03/26/2014 1st [facility] office visit/ Barry Lawrence   Chief Complaint  Patient presents with  . Pulmonary Consult    Referred by Dr. [name] for pulmonary clearance for AAA surgery.  Pt c/o SOB "over a period of time". He states that his legs usually give out before his breathing bothers him.    not on any resp rx at this point  Sleeps ok flat on cpap  with min am cough production thick and white and no tendency to acute excacerbations   Not limited by breathing from desired activities    No obvious other patterns in day to day or daytime variabilty or assoc   cp or chest tightness, subjective wheeze overt sinus or hb symptoms. No unusual exp hx or h/o childhood pna/ asthma or knowledge of premature birth.  Sleeping ok without nocturnal  or early am exacerbation  of respiratory  c/o's or need for noct saba. Also denies any obvious fluctuation of symptoms with weather or environmental changes or other aggravating or alleviating factors except as outlined above   Current Medications, Allergies, Complete Past Medical History, Past Surgical History, Family History, and Social History were reviewed in [employer] record.               Review of Systems  Constitutional: Negative for fever, chills, activity change, appetite change and unexpected weight change.  HENT: Negative for congestion, dental problem, postnasal drip, rhinorrhea, sneezing, sore throat, trouble swallowing and voice change.   Eyes: Negative for visual disturbance.  Respiratory: Positive for shortness of breath. Negative for cough and choking.   Cardiovascular: Negative for chest pain and leg swelling.  Gastrointestinal: Negative for nausea, vomiting and  abdominal pain.  Genitourinary: Negative for difficulty urinating.  Musculoskeletal: Negative for arthralgias.  Skin: Negative for rash.  Psychiatric/Behavioral: Negative for behavioral problems and confusion.       Objective:   Physical Exam  amb slt hoarse wm nad/ mildly congested sounding cough  Wt Readings from Last 3 Encounters:  03/26/14 201 lb (91.173 kg)  03/18/14 197 lb 8 oz (89.585 kg)  02/19/14 196 lb (88.905 kg)      HEENT: nl dentition, turbinates, and orophanx. Nl external ear canals without cough reflex   NECK :  without JVD/Nodes/TM/ nl carotid upstrokes bilaterally   LUNGS: no acc muscle use,  Min insp / exp rhonchi   CV:  RRR  no s3 or murmur or increase in P2, no edema   ABD: pot bellied  soft and nontender with decreased excursion but no def hoover in the supine position. No bruits or organomegaly, bowel sounds nl  MS:  warm without deformities, calf tenderness, cyanosis or clubbing  SKIN: warm and dry without lesions    NEURO:  alert, approp, no deficits      CT chest 03/06/14 1. Ascending thoracic aortic aneurysm with maximum dimension of the proximal and mid ascending thoracic aorta 4.2 cm. 2. Fusiform infrarenal abdominal aortic aneurysm is 8.6 cm in length, extends to the bifurcation, and measures 5.6 cm transverse by 5.5 cm anterior-posterior. There is chronic of marginal intramural thrombus within this aneurysm. Ectatic common iliac arteries as noted above. 3. Prior CABG with dense  calcification of the native coronary arteries. 4. Mild cardiomegaly. 5. 6 mm pulmonary nodule at the left lung apex. If the patient is at high risk for bronchogenic carcinoma, follow-up chest CT at 6-12 months is recommended. If the patient is at low risk for bronchogenic carcinoma, follow-up chest CT at 12 months is recommended.    Assessment & Plan:

## 2014-03-26 NOTE — Patient Instructions (Signed)
You have very little copd and unlikely you ever will if you stop smoking now (should stop for at least 2 weeks preop anyway)  The main problem with surgery will be the need to lie flat and the effect of your weight on your breathing so early mobilization and minimum sedating meds will be key

## 2014-03-26 NOTE — Progress Notes (Signed)
[facility] before only done today. Blair Hailey

## 2014-03-29 DIAGNOSIS — F17210 Nicotine dependence, cigarettes, uncomplicated: Secondary | ICD-10-CM | POA: Insufficient documentation

## 2014-03-29 DIAGNOSIS — R911 Solitary pulmonary nodule: Secondary | ICD-10-CM | POA: Insufficient documentation

## 2014-03-29 NOTE — Assessment & Plan Note (Signed)

## 2014-03-29 NOTE — Assessment & Plan Note (Signed)
CT chest 03/05/14 6 mm pulmonary nodule at the left lung apex. If the patient is at high risk for bronchogenic carcinoma, follow-up chest CT at 6-12 months is recommended > placed in tickle for recall 09/04/14

## 2014-03-29 NOTE — Assessment & Plan Note (Signed)
03/26/2014 spirometry FEV1  1.51 (51%) ratio 69 off all rx   He barely meets criteria for copd and his problem is mostly obesity and deconditioning with min am mucus related to smoking which he needs to ideally stop x 2 weeks.  The main challenge with surgery will be getting his wt off his diaphragms as soon as possible post op perhaps with a bipap "bridge" if needed but is cleared for surgery from my perspective

## 2014-03-31 ENCOUNTER — Encounter (HOSPITAL_COMMUNITY): Payer: Self-pay | Admitting: *Deleted

## 2014-03-31 ENCOUNTER — Inpatient Hospital Stay (HOSPITAL_COMMUNITY)
Admission: RE | Admit: 2014-03-31 | Discharge: 2014-04-01 | DRG: 301 | Disposition: A | Discharge status: Home / Self Care | Payer: BC Managed Care – PPO | Source: Ambulatory Visit | Attending: Cardiovascular Disease | Admitting: Cardiovascular Disease

## 2014-03-31 DIAGNOSIS — G4733 Obstructive sleep apnea (adult) (pediatric): Secondary | ICD-10-CM | POA: Diagnosis present

## 2014-03-31 DIAGNOSIS — I723 Aneurysm of iliac artery: Secondary | ICD-10-CM | POA: Diagnosis present

## 2014-03-31 DIAGNOSIS — F17210 Nicotine dependence, cigarettes, uncomplicated: Secondary | ICD-10-CM | POA: Diagnosis present

## 2014-03-31 DIAGNOSIS — Z951 Presence of aortocoronary bypass graft: Secondary | ICD-10-CM | POA: Diagnosis not present

## 2014-03-31 DIAGNOSIS — I739 Peripheral vascular disease, unspecified: Secondary | ICD-10-CM | POA: Diagnosis present

## 2014-03-31 DIAGNOSIS — G4730 Sleep apnea, unspecified: Secondary | ICD-10-CM | POA: Insufficient documentation

## 2014-03-31 DIAGNOSIS — M549 Dorsalgia, unspecified: Secondary | ICD-10-CM | POA: Diagnosis present

## 2014-03-31 DIAGNOSIS — E785 Hyperlipidemia, unspecified: Secondary | ICD-10-CM | POA: Diagnosis present

## 2014-03-31 DIAGNOSIS — I712 Thoracic aortic aneurysm, without rupture: Secondary | ICD-10-CM | POA: Diagnosis present

## 2014-03-31 DIAGNOSIS — Z8249 Family history of ischemic heart disease and other diseases of the circulatory system: Secondary | ICD-10-CM

## 2014-03-31 DIAGNOSIS — I2510 Atherosclerotic heart disease of native coronary artery without angina pectoris: Secondary | ICD-10-CM | POA: Diagnosis present

## 2014-03-31 DIAGNOSIS — K219 Gastro-esophageal reflux disease without esophagitis: Secondary | ICD-10-CM | POA: Diagnosis present

## 2014-03-31 DIAGNOSIS — I1310 Hypertensive heart and chronic kidney disease without heart failure, with stage 1 through stage 4 chronic kidney disease, or unspecified chronic kidney disease: Secondary | ICD-10-CM | POA: Diagnosis present

## 2014-03-31 DIAGNOSIS — J449 Chronic obstructive pulmonary disease, unspecified: Secondary | ICD-10-CM | POA: Diagnosis present

## 2014-03-31 DIAGNOSIS — N183 Chronic kidney disease, stage 3 (moderate): Secondary | ICD-10-CM | POA: Diagnosis present

## 2014-03-31 DIAGNOSIS — I714 Abdominal aortic aneurysm, without rupture: Secondary | ICD-10-CM | POA: Diagnosis present

## 2014-03-31 DIAGNOSIS — N184 Chronic kidney disease, stage 4 (severe): Secondary | ICD-10-CM | POA: Diagnosis present

## 2014-03-31 DIAGNOSIS — I701 Atherosclerosis of renal artery: Secondary | ICD-10-CM | POA: Diagnosis present

## 2014-03-31 DIAGNOSIS — I119 Hypertensive heart disease without heart failure: Secondary | ICD-10-CM | POA: Diagnosis present

## 2014-03-31 DIAGNOSIS — I7121 Aneurysm of the ascending aorta, without rupture: Secondary | ICD-10-CM

## 2014-03-31 DIAGNOSIS — R911 Solitary pulmonary nodule: Secondary | ICD-10-CM | POA: Diagnosis present

## 2014-03-31 HISTORY — DX: Chronic kidney disease, stage 3 (moderate): N18.3

## 2014-03-31 HISTORY — DX: Chronic kidney disease, stage 3 unspecified: N18.30

## 2014-03-31 LAB — COMPREHENSIVE METABOLIC PANEL
ALT: 21 U/L (ref 0–53)
AST: 25 U/L (ref 0–37)
Albumin: 3.8 g/dL (ref 3.5–5.2)
Alkaline Phosphatase: 38 U/L — ABNORMAL LOW (ref 39–117)
Anion gap: 18 — ABNORMAL HIGH (ref 5–15)
BUN: 30 mg/dL — ABNORMAL HIGH (ref 6–23)
CO2: 24 mEq/L (ref 19–32)
Calcium: 10 mg/dL (ref 8.4–10.5)
Chloride: 96 mEq/L (ref 96–112)
Creatinine, Ser: 1.83 mg/dL — ABNORMAL HIGH (ref 0.50–1.35)
GFR calc Af Amer: 42 mL/min — ABNORMAL LOW (ref >90)
GFR calc non Af Amer: 37 mL/min — ABNORMAL LOW (ref >90)
Glucose, Bld: 114 mg/dL — ABNORMAL HIGH (ref 70–99)
Potassium: 3.9 mEq/L (ref 3.7–5.3)
Sodium: 138 mEq/L (ref 137–147)
Total Bilirubin: 0.4 mg/dL (ref 0.3–1.2)
Total Protein: 7.3 g/dL (ref 6.0–8.3)

## 2014-03-31 LAB — CBC WITH DIFFERENTIAL/PLATELET
Basophils Absolute: 0.1 10*3/uL (ref 0.0–0.1)
Basophils Relative: 1 % (ref 0–1)
Eosinophils Absolute: 0.3 10*3/uL (ref 0.0–0.7)
Eosinophils Relative: 4 % (ref 0–5)
HCT: 51.6 % (ref 39.0–52.0)
Hemoglobin: 18.6 g/dL — ABNORMAL HIGH (ref 13.0–17.0)
Lymphocytes Relative: 25 % (ref 12–46)
Lymphs Abs: 2.1 10*3/uL (ref 0.7–4.0)
MCH: 32.3 pg (ref 26.0–34.0)
MCHC: 36 g/dL (ref 30.0–36.0)
MCV: 89.6 fL (ref 78.0–100.0)
Monocytes Absolute: 0.8 10*3/uL (ref 0.1–1.0)
Monocytes Relative: 9 % (ref 3–12)
Neutro Abs: 5.3 10*3/uL (ref 1.7–7.7)
Neutrophils Relative %: 62 % (ref 43–77)
Platelets: 215 10*3/uL (ref 150–400)
RBC: 5.76 MIL/uL (ref 4.22–5.81)
RDW: 15 % (ref 11.5–15.5)
WBC: 8.6 10*3/uL (ref 4.0–10.5)

## 2014-03-31 LAB — PROTIME-INR
INR: 1.04 (ref 0.00–1.49)
Prothrombin Time: 13.7 seconds (ref 11.6–15.2)

## 2014-03-31 MED ORDER — NITROGLYCERIN 0.4 MG SL SUBL: 0.4000 mg | SUBLINGUAL_TABLET | SUBLINGUAL | Status: DC | PRN

## 2014-03-31 MED ORDER — VITAMIN D3 25 MCG (1000 UNIT) PO TABS
5000.0000 [IU] | ORAL_TABLET | Freq: Every day | ORAL | Status: DC
  Administered 2014-04-01: 5000 [IU] via ORAL
  Filled 2014-03-31: qty 5

## 2014-03-31 MED ORDER — ALPRAZOLAM 0.5 MG PO TABS
0.5000 mg | ORAL_TABLET | Freq: Three times a day (TID) | ORAL | Status: DC | PRN
  Administered 2014-03-31 – 2014-04-01 (×2): 0.5 mg via ORAL
  Filled 2014-03-31 (×2): qty 1

## 2014-03-31 MED ORDER — PANTOPRAZOLE SODIUM 40 MG PO TBEC
40.0000 mg | DELAYED_RELEASE_TABLET | Freq: Every day | ORAL | Status: DC
  Administered 2014-03-31 – 2014-04-01 (×2): 40 mg via ORAL
  Filled 2014-03-31 (×2): qty 1

## 2014-03-31 MED ORDER — ACETAMINOPHEN 325 MG PO TABS
650.0000 mg | ORAL_TABLET | ORAL | Status: DC | PRN
  Administered 2014-03-31 – 2014-04-01 (×2): 650 mg via ORAL
  Filled 2014-03-31 (×2): qty 2

## 2014-03-31 MED ORDER — EZETIMIBE 10 MG PO TABS
10.0000 mg | ORAL_TABLET | Freq: Every day | ORAL | Status: DC
  Administered 2014-03-31: 10 mg via ORAL
  Filled 2014-03-31 (×2): qty 1

## 2014-03-31 MED ORDER — VITAMIN D3 125 MCG (5000 UT) PO CAPS: 5000.0000 [IU] | ORAL_CAPSULE | Freq: Every day | ORAL | Status: DC

## 2014-03-31 MED ORDER — ZOLPIDEM TARTRATE 5 MG PO TABS: 5.0000 mg | ORAL_TABLET | Freq: Every evening | ORAL | Status: DC | PRN

## 2014-03-31 MED ORDER — NICOTINE 21 MG/24HR TD PT24
21.0000 mg | MEDICATED_PATCH | Freq: Every day | TRANSDERMAL | Status: DC
  Administered 2014-03-31: 21 mg via TRANSDERMAL
  Filled 2014-03-31 (×2): qty 1

## 2014-03-31 MED ORDER — ENOXAPARIN SODIUM 40 MG/0.4ML ~~LOC~~ SOLN
40.0000 mg | SUBCUTANEOUS | Status: DC
  Administered 2014-03-31: 40 mg via SUBCUTANEOUS
  Filled 2014-03-31 (×2): qty 0.4

## 2014-03-31 MED ORDER — FENOFIBRATE 160 MG PO TABS
160.0000 mg | ORAL_TABLET | Freq: Every day | ORAL | Status: DC
  Administered 2014-03-31: 160 mg via ORAL
  Filled 2014-03-31 (×2): qty 1

## 2014-03-31 MED ORDER — SODIUM CHLORIDE 0.9 % IV SOLN
INTRAVENOUS | Status: DC
  Administered 2014-03-31: 15:00:00 via INTRAVENOUS

## 2014-03-31 MED ORDER — CARVEDILOL 6.25 MG PO TABS
6.2500 mg | ORAL_TABLET | Freq: Two times a day (BID) | ORAL | Status: DC
  Administered 2014-03-31 – 2014-04-01 (×2): 6.25 mg via ORAL
  Filled 2014-03-31 (×4): qty 1

## 2014-03-31 MED ORDER — ATORVASTATIN CALCIUM 80 MG PO TABS
80.0000 mg | ORAL_TABLET | Freq: Every day | ORAL | Status: DC
  Administered 2014-03-31: 80 mg via ORAL
  Filled 2014-03-31 (×2): qty 1

## 2014-03-31 MED ORDER — ONDANSETRON HCL 4 MG/2ML IJ SOLN
4.0000 mg | Freq: Four times a day (QID) | INTRAMUSCULAR | Status: DC | PRN
Start: 2014-03-31 — End: 2014-04-01

## 2014-03-31 MED ORDER — AMLODIPINE BESYLATE 10 MG PO TABS
10.0000 mg | ORAL_TABLET | Freq: Every day | ORAL | Status: DC
  Administered 2014-04-01: 10 mg via ORAL
  Filled 2014-03-31: qty 1

## 2014-03-31 MED ORDER — SAW PALMETTO 450 MG PO CAPS: 900.0000 mg | ORAL_CAPSULE | Freq: Two times a day (BID) | ORAL | Status: DC

## 2014-03-31 NOTE — H&P (Signed)
History and Physical   Patient ID: Barry Lawrence MRN: [id number], DOB/AGE: Oct 12, 1945 68 y.o. Date of Encounter: 03/31/2014  Primary Physician: [name], MD Primary Cardiologist: Dr Gwenlyn Found  Chief Complaint:  AAA, PVD  HPI: Barry Lawrence is a 68 y.o. male with a history of CAD/CABG, PAD, HTN, HLD, R CEA, and AAA. His aneurysm has been followed by Dr. Alvester Chou. At last check, it had increased in size to 5.5 cm. He was referred to Dr. [name] for possible repair.  Dr. [name] felt the aneurysm was suitable for endovascular repair, however his iliac arteries have stents bilaterally. An open repair may be needed. He was referred to pulmonary for evaluation and it was recommended he have an angiogram to assess his vasculature. He was seen by Dr. Melvyn Novas and is here today for admission, the abdominal aortogram as well as pelvic angiogram is scheduled for a.m.   He is having no chest pain or SOB. His respiratory status is at baseline. He is having no PND or edema. He has not been having chest pain or noticed any change in his activity level. He is still smoking and states is unable to quit ("I'm scared to quit").   Past Medical History  Diagnosis Date  . CAD (coronary artery disease)     myoview 04/21/11-normal, EF49%  . S/P CABG (coronary artery bypass graft) 1996  . Hypertension   . Hyperlipemia   . Carotid stenosis 01/15/08    right endarterectomy-Dr. Amedeo Plenty  . PAD (peripheral artery disease) 06/12/09    11/22/07 PTA & stenting right external iliac artery;bilateral iliac & PTI and stenting 1997;right ICA =>50% reduction,right SFA >50%,left ICA at stent => 50% reduction,left EIA => 50% reduction,left ATA occluded, left SFA mid > 49% reduction  . Abdominal aortic aneurysm 01/28/10    4.3x4.6cm  . Polycythemia     H/O  . GERD (gastroesophageal reflux disease)   . Tobacco abuse   . OSA on CPAP   . CKD (chronic kidney disease), stage III     Surgical History:  Past Surgical History   Procedure Laterality Date  . Coronary artery bypass graft  1996    LIMA to LAD, sequential vein to OM1 and 2 as well as acure marginal branch and diag branch  . Carotid endarterectomy  01/15/08  . Lumbar disc surgery  1998  . Pv angio  11/22/2007    PTA/ stenting of right common iliac artery with a 10x69mm Smart stent and postdilated with a 7x40mmpowerflex balloon; high grade calcified stenosis of the RICA  . Colonoscopy N/A 07/09/2013    Procedure: COLONOSCOPY;  Surgeon: Juanita Craver, MD;  Location: [facility];  Service: Endoscopy;  Laterality: N/A;    I have reviewed the patient's current medications. Prior to Admission medications   Medication Sig Start Date End Date Taking? Authorizing Provider  amLODipine (NORVASC) 10 MG tablet Take 10 mg by mouth daily.    Historical Provider, MD  Aspirin-Acetaminophen-Caffeine (GOODY HEADACHE PO) Take 1 Package by mouth as needed (for pain).    Historical Provider, MD  atorvastatin (LIPITOR) 80 MG tablet Take 80 mg by mouth daily.    Historical Provider, MD  carvedilol (COREG) 6.25 MG tablet Take 6.25 mg by mouth 2 (two) times daily with a meal.    Historical Provider, MD  ezetimibe (ZETIA) 10 MG tablet Take 10 mg by mouth daily.    Historical Provider, MD  fenofibrate 160 MG tablet Take 160 mg by mouth daily.  Historical Provider, MD  losartan-hydrochlorothiazide (HYZAAR) 100-25 MG per tablet Take 1 tablet by mouth daily.    Historical Provider, MD  omeprazole (PRILOSEC) 20 MG capsule Take 20 mg by mouth daily.    Historical Provider, MD  Saw Palmetto, Serenoa repens, (SAW PALMETTO PO) Take 2 tablets by mouth 2 (two) times daily.    Historical Provider, MD   Allergies:  Allergies  Allergen Reactions  . Niaspan [Niacin Er] Hives   History   Social History  . Marital Status: Married    Spouse Name: N/A    Number of Children: 2  . Years of Education: N/A   Occupational History  . Retired Estate manager/land agent    Social History Main Topics  .  Smoking status: Current Every Day Smoker -- 2.00 packs/day for 56 years    Types: Cigarettes  . Smokeless tobacco: Never Used  . Alcohol Use: No  . Drug Use: No  . Sexual Activity: Not on file   Other Topics Concern  . Not on file   Social History Narrative   Lives with wife.    Family History  Problem Relation Age of Onset  . Heart failure Father    Family Status  Relation Status Death Age  . Mother Deceased   . Father Deceased    Review of Systems:    Mild dyspnea on exertion, no angina, significant frequency and hesitancy with mild nocturia, erectile dysfunction, mild edema, other than as noted above the remainder of the review of systems is unremarkable.  Physical Exam: Blood pressure 171/94, pulse 90, temperature 97.4 F (36.3 C), temperature source Oral, resp. rate 18, height 5\' 7"  (1.702 m), weight 188 lb 11.4 oz (85.6 kg), SpO2 97 %. General: Well developed, well nourished,male in no acute distress. Head: Normocephalic, atraumatic, sclera non-icteric, no xanthomas, nares are without discharge. Dentition: poor, balding male hair pattern Neck: Left carotid bruit. JVD not elevated. No thyromegally Lungs: Good expansion bilaterally. without wheezes or rhonchi. Slightly decreased BS bilateral bases, no wheeze noted. Heart: Regular rate and rhythm with S1 S2.  No S3 or S4.  Soft systolic murmur, no rubs, or gallops appreciated.healed median sternotomy scar Abdomen: Soft, non-tender, non-distended with normoactive bowel sounds. No hepatomegaly .abdominal aortic impulse prominent Msk:  Strength and tone appear normal for age. No joint deformities or effusions, no spine or costo-vertebral angle tenderness. Extremities: No clubbing or cyanosis. No edema.  Distal pedal pulses are 2+ in upper extrem, Pedal pulses 1+ bilaterally, healed saphenous vein grafting in right lower extremity Neuro: Alert and oriented X 3. Moves all extremities spontaneously. No focal deficits noted. Psych:   Responds to questions appropriately with a normal affect. Skin: discoloration over both lower extremities  Labs: Pending  ASSESSMENT AND PLAN:  Principal Problem:   Abdominal aortic aneurysm - per Dr. [name], final decision on surgery to be made after angiogram.  Active Problems:   Hypertensive heart disease - Continue home medications    COPD GOLD II still smoking - Cessation discussed and encouraged, use a nicotine patch while in hospital    Solitary pulmonary nodule - Seen on chest CT 03/06/2014, repeat at 6 months    CKD (chronic kidney disease), stage III - Creatinine 1.75 at the [place] in September and 2.0 on 10/13. check now, gentle hydration overnight at 50 cc/hr, increase to 1 ml/kg in am. Recheck BMET in a.m. Hold losartan HCTZ   Coronary artery disease with previous bypass grafting with no angina currently stable  Significant peripheral vascular disease   W. Tollie Eth, Brooke Bonito. MD [facility]  03/31/2014 3:15 PM

## 2014-04-01 ENCOUNTER — Encounter (HOSPITAL_COMMUNITY)
Admission: RE | Disposition: A | Discharge status: Home / Self Care | Payer: Self-pay | Source: Ambulatory Visit | Attending: Cardiovascular Disease

## 2014-04-01 ENCOUNTER — Ambulatory Visit: Payer: BC Managed Care – PPO | Admitting: Surgery

## 2014-04-01 ENCOUNTER — Encounter (HOSPITAL_COMMUNITY): Payer: Self-pay | Admitting: Nurse Practitioner

## 2014-04-01 ENCOUNTER — Ambulatory Visit (HOSPITAL_COMMUNITY)
Admission: RE | Admit: 2014-04-01 | Payer: BC Managed Care – PPO | Source: Ambulatory Visit | Admitting: Cardiovascular Disease

## 2014-04-01 DIAGNOSIS — I701 Atherosclerosis of renal artery: Secondary | ICD-10-CM

## 2014-04-01 HISTORY — PX: ABDOMINAL AORTAGRAM: SHX5454

## 2014-04-01 HISTORY — PX: OTHER SURGICAL HISTORY: SHX169

## 2014-04-01 LAB — BASIC METABOLIC PANEL
Anion gap: 13 (ref 5–15)
BUN: 30 mg/dL — ABNORMAL HIGH (ref 6–23)
CO2: 27 mEq/L (ref 19–32)
Calcium: 9.5 mg/dL (ref 8.4–10.5)
Chloride: 98 mEq/L (ref 96–112)
Creatinine, Ser: 1.8 mg/dL — ABNORMAL HIGH (ref 0.50–1.35)
GFR calc Af Amer: 43 mL/min — ABNORMAL LOW (ref >90)
GFR calc non Af Amer: 37 mL/min — ABNORMAL LOW (ref >90)
Glucose, Bld: 88 mg/dL (ref 70–99)
Potassium: 3.7 mEq/L (ref 3.7–5.3)
Sodium: 138 mEq/L (ref 137–147)

## 2014-04-01 SURGERY — ABDOMINAL AORTAGRAM
Anesthesia: LOCAL

## 2014-04-01 MED ORDER — SODIUM CHLORIDE 0.9 % IV SOLN: 250.0000 mL | INTRAVENOUS | Status: DC | PRN

## 2014-04-01 MED ORDER — SODIUM CHLORIDE 0.9 % IV SOLN
1.0000 mL/kg/h | INTRAVENOUS | Status: DC
  Administered 2014-04-01: 1 mL/kg/h via INTRAVENOUS

## 2014-04-01 MED ORDER — MIDAZOLAM HCL 2 MG/2ML IJ SOLN
INTRAMUSCULAR | Status: AC
Start: 2014-04-01 — End: 2014-04-01
  Filled 2014-04-01: qty 2

## 2014-04-01 MED ORDER — HEPARIN (PORCINE) IN NACL 2-0.9 UNIT/ML-% IJ SOLN
INTRAMUSCULAR | Status: AC
  Filled 2014-04-01: qty 1000

## 2014-04-01 MED ORDER — SODIUM CHLORIDE 0.9 % IJ SOLN
3.0000 mL | Freq: Two times a day (BID) | INTRAMUSCULAR | Status: DC
  Administered 2014-04-01: 3 mL via INTRAVENOUS

## 2014-04-01 MED ORDER — FENTANYL CITRATE 0.05 MG/ML IJ SOLN
INTRAMUSCULAR | Status: AC
  Filled 2014-04-01: qty 2

## 2014-04-01 MED ORDER — SODIUM CHLORIDE 0.9 % IV SOLN: INTRAVENOUS | Status: AC

## 2014-04-01 MED ORDER — ONDANSETRON HCL 4 MG/2ML IJ SOLN: 4.0000 mg | Freq: Four times a day (QID) | INTRAMUSCULAR | Status: DC | PRN

## 2014-04-01 MED ORDER — HYDRALAZINE HCL 20 MG/ML IJ SOLN
INTRAMUSCULAR | Status: AC
  Filled 2014-04-01: qty 1

## 2014-04-01 MED ORDER — OXYCODONE-ACETAMINOPHEN 5-325 MG PO TABS
1.0000 | ORAL_TABLET | ORAL | Status: DC | PRN
  Administered 2014-04-01: 2 via ORAL
  Filled 2014-04-01: qty 2

## 2014-04-01 MED ORDER — ACETAMINOPHEN 325 MG PO TABS: 650.0000 mg | ORAL_TABLET | ORAL | Status: DC | PRN

## 2014-04-01 MED ORDER — OXYCODONE-ACETAMINOPHEN 5-325 MG PO TABS: 2.0000 | ORAL_TABLET | Freq: Four times a day (QID) | ORAL | Status: DC | PRN

## 2014-04-01 MED ORDER — LIDOCAINE HCL (PF) 1 % IJ SOLN
INTRAMUSCULAR | Status: AC
  Filled 2014-04-01: qty 30

## 2014-04-01 MED ORDER — SODIUM CHLORIDE 0.9 % IJ SOLN: 3.0000 mL | INTRAMUSCULAR | Status: DC | PRN

## 2014-04-01 MED ORDER — HYDRALAZINE HCL 20 MG/ML IJ SOLN: 10.0000 mg | INTRAMUSCULAR | Status: DC | PRN

## 2014-04-01 MED ORDER — MORPHINE SULFATE 2 MG/ML IJ SOLN
2.0000 mg | INTRAMUSCULAR | Status: DC | PRN
  Administered 2014-04-01: 2 mg via INTRAVENOUS
  Filled 2014-04-01: qty 1

## 2014-04-01 NOTE — Progress Notes (Signed)
Dr. Tommi Rumps  Here and O.K. For D.C. Tonight . D.C. Instr. Given and pt. Has no questions. R.N. Aware and pt. D.C. W/ wife and belongings.

## 2014-04-01 NOTE — Progress Notes (Signed)
Amb. Patient in hall 550 feet . Tol well no bleeding or pain noted. D.C. Pressure dsg and  Applied bandaide. Small soft brusise. No change from earlier.Pateint for d.c. Later tonight awaiting M.D. To come back and assess patient

## 2014-04-01 NOTE — Progress Notes (Signed)
Subjective: Has headache and back ache with hx of back surgery  Objective: Vital signs in last 24 hours: Temp:  [97.3 F (36.3 C)-97.9 F (36.6 C)] 97.9 F (36.6 C) (11/09 0529) Pulse Rate:  [57-90] 63 (11/09 0751) Resp:  [18] 18 (11/09 0751) BP: (144-171)/(71-94) 148/73 mmHg (11/09 0751) SpO2:  [95 %-97 %] 97 % (11/09 0529) Weight:  [188 lb 11.4 oz (85.6 kg)-192 lb 0.3 oz (87.1 kg)] 192 lb 0.3 oz (87.1 kg) (11/09 0529) Weight change:  Last BM Date: 03/31/14 Intake/Output from previous day: -1190; I 360, O 1550 11/08 0701 - 11/09 0700 In: 360 [P.O.:360] Out: 1550 [Urine:1550] Intake/Output this shift: Total I/O In: -  Out: 500 [Urine:500]  PE: General:Pleasant affect, NAD Skin:Warm and dry, brisk capillary refill HEENT:normocephalic, sclera clear, mucus membranes moist Neck:supple, no JVD Heart:S1S2 RRR without murmur, gallup, rub or click Lungs:clear without rales, rhonchi, or wheezes [id number], non tender, + BS, do not palpate liver spleen or masses Ext:no lower ext edema, 2+ pedal pulses, 2+ radial pulses Neuro:alert and oriented, MAE, follows commands, + facial symmetry   Lab Results:  Recent Labs  03/31/14 1521  WBC 8.6  HGB 18.6*  HCT 51.6  PLT 215   BMET  Recent Labs  03/31/14 1521 04/01/14 0442  NA 138 138  K 3.9 3.7  CL 96 98  CO2 24 27  GLUCOSE 114* 88  BUN 30* 30*  CREATININE 1.83* 1.80*  CALCIUM 10.0 9.5   No results for input(s): TROPONINI in the last 72 hours.  Invalid input(s): CK, MB  Lab Results  Component Value Date   CHOL 117 02/16/2013   HDL 25* 02/16/2013   LDLCALC 55 02/16/2013   TRIG 187* 02/16/2013   CHOLHDL 4.7 02/16/2013   No results found for: HGBA1C   Lab Results  Component Value Date   TSH 1.824 02/16/2013    Hepatic Function Panel  Recent Labs  03/31/14 1521  PROT 7.3  ALBUMIN 3.8  AST 25  ALT 21  ALKPHOS 38*  BILITOT 0.4   No results for input(s): CHOL in the last 72 hours. No  results for input(s): PROTIME in the last 72 hours.     Studies/Results: No results found.  Medications: I have reviewed the patient's current medications. Scheduled Meds: . amLODipine  10 mg Oral Daily  . atorvastatin  80 mg Oral QHS  . carvedilol  6.25 mg Oral BID WC  . cholecalciferol  5,000 Units Oral Daily  . enoxaparin (LOVENOX) injection  40 mg Subcutaneous Q24H  . ezetimibe  10 mg Oral QHS  . fenofibrate  160 mg Oral QHS  . nicotine  21 mg Transdermal Daily  . pantoprazole  40 mg Oral Daily  . sodium chloride  3 mL Intravenous Q12H   Continuous Infusions: . sodium chloride 50 mL/hr at 03/31/14 1523  . sodium chloride 1 mL/kg/hr (04/01/14 0536)   PRN Meds:.sodium chloride, acetaminophen, ALPRAZolam, nitroGLYCERIN, ondansetron (ZOFRAN) IV, sodium chloride, zolpidem  Assessment/Plan: 68 y.o. male with a history of CAD/CABG, PAD, HTN, HLD, R CEA, and AAA. His aneurysm has been followed by Dr. Gwenlyn Found. At last check, it had increased in size to 5.5 cm. He was referred to Dr. [name] for possible repair.  Dr. [name] felt the aneurysm was suitable for endovascular repair, however his iliac arteries have stents bilaterally. An open repair may be needed. He was referred to pulmonary for evaluation and it was recommended he have an angiogram  to assess his vasculature. He was seen by Dr. Melvyn Novas and is here today for admission, the abdominal aortogram as well as pelvic angiogram is scheduled for a.m.   Principal Problem:   PAD (peripheral artery disease) plan for abd aorta gram and pelvic angiogram is scheduled for today.   He has had both iliacs stented as well as his SFAs in the past.. He has a large -sized abdominal aortic aneurysm measuring 5.5 x 5.5cm. We have been following this by duplex ultrasound Active Problems:   CAD (coronary artery disease)- CABG in 1996, a LIMA to his LAD, sequential vein to OM1 and 2 as well as acute marginal branch and diagonal branch   Hypertensive heart  disease- BP mildly elevated   COPD GOLD II still smoking    Cigarette smoker- afraid to stop on nicotine patch   CKD (chronic kidney disease), stage III Cr. 1.80 down from 2.0 on the 13 on Oct.   Thoracic ascending aortic aneurysm   Back ache will add percocet with cath coming up.   LOS: 1 day   Time spent with pt. :15 minutes. [facility] R  Nurse Practitioner Certified Pager [phone] or after 5pm and on weekends call [phone] 04/01/2014, 8:13 AM

## 2014-04-01 NOTE — Progress Notes (Signed)
Took right groin dsg off and patient was oozing small amt of blood from site. Applied pressure x 80mins. R.N. Aware and applied pressure dsg. Dr Tommi Rumps notified and will come see patient.good pulses no pain.

## 2014-04-01 NOTE — Progress Notes (Signed)
Utilization Review Completed.Barry Lawrence T1/19/2015  

## 2014-04-01 NOTE — Interval H&P Note (Signed)
History and Physical Interval Note:  04/01/2014 2:05 PM  Barry Lawrence  has presented today for surgery, with the diagnosis of pvd  The various methods of treatment have been discussed with the patient and family. After consideration of risks, benefits and other options for treatment, the patient has consented to  Procedure(s): ABDOMINAL AORTAGRAM (N/A) as a surgical intervention .  The patient's history has been reviewed, patient examined, no change in status, stable for surgery.  I have reviewed the patient's chart and labs.  Questions were answered to the patient's satisfaction.     [name]

## 2014-04-01 NOTE — Plan of Care (Signed)
Problem: Phase II Progression Outcomes Goal: Barriers To Progression Addressed/Resolved Outcome: Completed/Met Date Met:  04/01/14 Goal: Discharge plan in place and appropriate Outcome: Completed/Met Date Met:  48/40/39 Goal: Complications resolved/controlled Outcome: Completed/Met Date Met:  04/01/14 Goal: Vascular site scale level 0 - I Vascular Site Scale Level 0: No bruising/bleeding/hematoma Level I (Mild): Bruising/Ecchymosis, minimal bleeding/ooozing, palpable hematoma < 3 cm Level II (Moderate): Bleeding not affecting hemodynamic parameters, pseudoaneurysm, palpable hematoma > 3 cm Level III (Severe) Bleeding which affects hemodynamic parameters or retroperitoneal hemorrhage  Outcome: Completed/Met Date Met:  04/01/14 Goal: Other Discharge Outcomes/Goals Outcome: Completed/Met Date Met:  04/01/14

## 2014-04-01 NOTE — CV Procedure (Signed)
SELWYN Lawrence is a 68 y.o. male    Barry Lawrence:[id number] LOCATION:  FACILITY: [place]  PHYSICIAN: Barry Lawrence, M.D. [birth year]   DATE OF PROCEDURE:  04/01/2014  DATE OF DISCHARGE:     PV Angiogram/Intervention    History obtained from chart review.The patient is a 68 year old moderately-overweight married Caucasian male, father of 2 and grandfather to 8 grandchildren, whom I last saw 12 months ago. He has a history of CAD, status post coronary artery bypass grafting in 1996 with a LIMA to his LAD, sequential vein to OM1 and 2 as well as acute marginal branch and diagonal branch. 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 Drucie Lawrence January 15, 2008. Carotid Dopplers performed in March of this year showed a widely patent endarterectomy site. He has had both iliacs stented as well as his SFAs in the past.. He has a large -sized abdominal aortic aneurysm measuring 5.5 x 5.5cm. We have been following this by duplex ultrasound. His other risk factors include hypertension, hyperlipidemia, and continued tobacco abuse of 2 packs a day, smoking cigars, recalcitrant to risk-factor modification. He has obstructive sleep apnea and is on CPAP.  Since I saw him back a year ago he complained of increasing shortness of breath but denies chest pain. He apparently did have a Myoview stress test a year ago at the [facility].he does continue to complain of claudication although his ABI is really haven't changed and the stents appear to be open. Recent abdominal ultrasound revealed this abdominal aortic aneurysm measured 5.6 cm. His ABIs are in the mid 0.7 range with patent iliac stents. He does have chronic renal insufficiency with creatinines running in the 1.8 range. He presents now for abdominal aortography and bilateral iliac angiography to determine stent patency and suitability for endoluminal stent grafting.   PROCEDURE DESCRIPTION:   The patient was brought to the second  floor [facility] cath lab in the postabsorptive state. He was premedicated with 5 mg of Valium by mouth, IV Versed and fentanyl. His right groinwas prepped and shaved in usual sterile fashion. Xylocaine 1% was used for local anesthesia. A 5 French sheath was inserted into the right common femoral artery using standard Seldinger technique. A 5 French pigtail catheter was placed in the midabdominal aorta at the level of the renal arteries and then withdrawn down to the bifurcation. History of abdominal aortography and bilateral iliac angiography and oblique views were performed. A total of 50 mL of contrast was administered to the patient. Omnipaque dye was used for the entirety of the case. Retrograde aortic pressure was monitored during the case.  HEMODYNAMICS:    AO SYSTOLIC/AO DIASTOLIC: [id number]   Angiographic Data:   1: Abdominal aortogram-there was a 90% left renal artery stenosis. The infrarenal abdominal aorta revealed a large saccular aneurysm beginning approximately 2 cm below the takeoff of the renal arteries and extending to the iliac bifurcation.  2: Left lower extremity-there appeared to be a left common iliac artery aneurysm. The left iliac stent was widely patent.  3: Right lower extremity-the distal right common iliac stent was widely patent.  IMPRESSION:large infrarenal abdominal aortic aneurysm. High-grade left renal artery stenosis, patent iliac stents. A total of 50 mL of contrast was used. The patient will be hydrated and discharged home. He'll be seen back in the office. Dr. Trula Lawrence is aware of the results and angiographic findings.    Barry Harp MD, [facility] 04/01/2014 2:33 PM

## 2014-04-01 NOTE — Discharge Summary (Signed)
Discharge Summary   Patient ID: Barry Lawrence,  MRN: [id number], DOB/AGE: 1945-10-14 68 y.o.  Admit date: 03/31/2014 Discharge date: 04/01/2014  Primary Care Provider: [name] Primary Cardiologist: Adora Fridge, MD  Vascular Surgeon: Orvan Falconer, MD  Discharge Diagnoses Principal Problem:   PAD (peripheral artery disease)  **S/P peripheral angiography this admission.  Active Problems:   CAD (coronary artery disease)   Hypertensive heart disease   COPD GOLD II still smoking    Cigarette smoker   CKD (chronic kidney disease), stage III   Thoracic ascending aortic aneurysm   Allergies Allergies  Allergen Reactions  . Niaspan [Niacin Er] Hives    Procedures  Peripheral Angiography 11.9.2015  Angiographic Data:   1: Abdominal aortogram-there was a 90% left renal artery stenosis. The infrarenal abdominal aorta revealed a large saccular aneurysm beginning approximately 2 cm below the takeoff of the renal arteries and extending to the iliac bifurcation.  2: Left lower extremity-there appeared to be a left common iliac artery aneurysm. The left iliac stent was widely patent.  3: Right lower extremity-the distal right common iliac stent was widely patent.  IMPRESSION:large infrarenal abdominal aortic aneurysm. High-grade left renal artery stenosis, patent iliac stents. A total of 50 mL of contrast was used. The patient will be hydrated and discharged home. He'll be seen back in the office. Dr. [name] is aware of the results and angiographic findings. _____________   History of Present Illness  68 year old male with prior history of coronary artery disease status post coronary artery bypass grafting, along with a history of peripheral arterial disease status post prior iliac stenting, abdominal aortic aneurysm measuring 5.5 cm, hypertension, and hyperlipidemia. He is recently followed up with vascular surgery and recommendation was made for repeat peripheral angiography.  As the patient has a history of chronic kidney disease, decision was made to admit him for hydration one day prior to the scheduled procedure.  Hospital Course  Patient was admitted on November 8 and started on IV hydration.  Creatinine on admission was 1.83.  Creatinine this morning was 1.80. He underwent peripheral angiography this morning revealing patent iliac stents bilaterally with a 90% left renal artery stenosis.  Recommendation was made for follow-up with Dr. [name] scheduled  And it was felt the patient could be discharged home this evening. Prior to discharge, he did have some superficial oozing from his catheterization site and provided that this is stable later this evening, he'll be discharged home in good condition.  Discharge Vitals Blood pressure 154/82, pulse 60, temperature 98.1 F (36.7 C), temperature source Oral, resp. rate 20, height 5\' 7"  (1.702 m), weight 192 lb 0.3 oz (87.1 kg), SpO2 90 %.  Filed Weights   03/31/14 1233 04/01/14 0529  Weight: 188 lb 11.4 oz (85.6 kg) 192 lb 0.3 oz (87.1 kg)    Labs  CBC  Recent Labs  03/31/14 1521  WBC 8.6  NEUTROABS 5.3  HGB 18.6*  HCT 51.6  MCV 89.6  PLT 123456   Basic Metabolic Panel  Recent Labs  03/31/14 1521 04/01/14 0442  NA 138 138  K 3.9 3.7  CL 96 98  CO2 24 27  GLUCOSE 114* 88  BUN 30* 30*  CREATININE 1.83* 1.80*  CALCIUM 10.0 9.5   Liver Function Tests  Recent Labs  03/31/14 1521  AST 25  ALT 21  ALKPHOS 38*  BILITOT 0.4  PROT 7.3  ALBUMIN 3.8   Disposition  Pt is being discharged home today in good condition.  Follow-up Plans & Appointments  Follow-up Information    Follow up with Eldridge Abrahams, MD On 04/08/2014.   Specialty:  Vascular Surgery   Why:  8:45 AM   Contact information:   [address] [place] [address] [phone]       Follow up with Isaiah Serge, NP On 04/15/2014.   Specialty:  Cardiology   Why:  9:00 AM   Contact information:   [address] [address] [phone]       Discharge Medications    Medication List    TAKE these medications        amLODipine 10 MG tablet  Commonly known as:  NORVASC  Take 10 mg by mouth daily.     atorvastatin 80 MG tablet  Commonly known as:  LIPITOR  Take 80 mg by mouth at bedtime.     carvedilol 6.25 MG tablet  Commonly known as:  COREG  Take 6.25 mg by mouth 2 (two) times daily with a meal.     ezetimibe 10 MG tablet  Commonly known as:  ZETIA  Take 10 mg by mouth at bedtime.     fenofibrate 160 MG tablet  Take 160 mg by mouth at bedtime.     GOODY HEADACHE PO  Take 1 packet by mouth every hour as needed (for pain).     losartan-hydrochlorothiazide 100-25 MG per tablet  Commonly known as:  HYZAAR  Take 1 tablet by mouth daily.     omeprazole 20 MG capsule  Commonly known as:  PRILOSEC  Take 20 mg by mouth at bedtime.     Saw Palmetto 450 MG Caps  Take 900 mg by mouth 2 (two) times daily.     SAW PALMETTO PO  Take 2 tablets by mouth 2 (two) times daily.     tamsulosin 0.4 MG Caps capsule  Commonly known as:  FLOMAX     VIAGRA 100 MG tablet  Generic drug:  sildenafil     Vitamin D3 5000 UNITS Caps  Take 5,000 Units by mouth daily.       Outstanding Labs/Studies  None  Duration of Discharge Encounter   Greater than 30 minutes including physician time.  Signed, Murray Hodgkins NP 04/01/2014, 8:21 PM

## 2014-04-01 NOTE — Plan of Care (Signed)
Problem: Phase II Progression Outcomes Goal: Pain controlled with appropriate interventions Outcome: Completed/Met Date Met:  04/01/14 Goal: Hemodynamically stable Outcome: Completed/Met Date Met:  04/01/14 Goal: Ambulates up to 600 ft. in hall x 1 Outcome: Completed/Met Date Met:  04/01/14 Goal: Tolerates diet Outcome: Completed/Met Date Met:  04/01/14 Goal: Activity appropriate for discharge plan Outcome: Completed/Met Date Met:  04/01/14

## 2014-04-01 NOTE — Discharge Instructions (Signed)

## 2014-04-01 NOTE — Progress Notes (Signed)
Site area: right groin a 5 french arterial sheath was sremoved  Site Prior to Removal:  Level 0  Pressure Applied For 20 MINUTES    Minutes Beginning at 1345  Manual:   Yes.    Patient Status During Pull:  stable  Post Pull Groin Site:  Level 0  Post Pull Instructions Given:  Yes.    Post Pull Pulses Present:  Yes.    Dressing Applied:  Yes.    Comments:  VS remain stable during sheath pull.  Pt denies any pain at this site

## 2014-04-03 ENCOUNTER — Encounter: Payer: Self-pay | Admitting: Surgery

## 2014-04-05 ENCOUNTER — Encounter: Payer: Self-pay | Admitting: Surgery

## 2014-04-08 ENCOUNTER — Ambulatory Visit (INDEPENDENT_AMBULATORY_CARE_PROVIDER_SITE_OTHER): Payer: BC Managed Care – PPO | Admitting: Surgery

## 2014-04-08 ENCOUNTER — Encounter: Payer: Self-pay | Admitting: Surgery

## 2014-04-08 VITALS — BP 160/82 | HR 62 | Ht 67.0 in | Wt 197.0 lb

## 2014-04-08 DIAGNOSIS — I714 Abdominal aortic aneurysm, without rupture, unspecified: Secondary | ICD-10-CM

## 2014-04-08 DIAGNOSIS — R911 Solitary pulmonary nodule: Secondary | ICD-10-CM

## 2014-04-08 DIAGNOSIS — I6529 Occlusion and stenosis of unspecified carotid artery: Secondary | ICD-10-CM

## 2014-04-08 NOTE — Addendum Note (Signed)
Addended by: Mena Goes on: 04/08/2014 09:53 AM   Modules accepted: Orders

## 2014-04-08 NOTE — Progress Notes (Addendum)
Patient name: Barry Lawrence MRN: [place]:[id number] DOB: 1945/06/10 Sex: male     Chief Complaint  Patient presents with  . Re-evaluation    3 wk f/u     HISTORY OF PRESENT ILLNESS: The patient is back today for further discussions regarding his abdominal aortic aneurysm with maximum diameter of 5.6 cm.  He recently underwent angiography by Dr. Alvester Chou which revealed a high-grade left renal artery stenosis.  The patient has a history of iliac stenting.  Based on his imaging, I do not feel he has adequate anatomy for an endovascular procedure.  The patient also underwent pulmonary evaluation by Dr. Melvyn Novas.  He is cleared for surgery.  He does continue to smoke.   The patient has a history of coronary artery disease. He is status post CABG in 1996. He had a Myoview approximately 1 year ago at the [facility]. He is also status post right carotid endarterectomy by Dr. Amedeo Plenty in 2009.. The patient has bilateral claudication. He has undergone iliac stenting bilaterally. He still complains of trouble walking.  The patient suffers from hypercholesterolemia which is managed with a statin. He is on multiple medications for hypertension. He continues to smoke approximately 2 packs a day. His shortness of breath has been fairly stable Past Medical History  Diagnosis Date  . CAD (coronary artery disease)     myoview 04/21/11-normal, EF49%  . S/P CABG (coronary artery bypass graft) 1996  . Hypertension   . Hyperlipemia   . Carotid stenosis 01/15/08    right endarterectomy-Dr. Amedeo Plenty  . PAD (peripheral artery disease) 06/12/09    a. 11/22/07 PTA & stenting right external iliac artery;bilateral iliac & PTI and stenting 1997;right ICA =>50% reduction,right SFA >50%,left ICA at stent => 50% reduction,left EIA => 50% reduction,left ATA occluded, left SFA mid > 49% reduction;  03/2014 Angio: LRA 90, patent L Iliac stent and distal RCIA stent, large saccular AAA.  [name] Abdominal aortic aneurysm 01/28/10    4.3x4.6cm    . Polycythemia     H/O  . GERD (gastroesophageal reflux disease)   . Tobacco abuse   . OSA on CPAP   . CKD (chronic kidney disease), stage III     Past Surgical History  Procedure Laterality Date  . Coronary artery bypass graft  1996    LIMA to LAD, sequential vein to OM1 and 2 as well as acure marginal branch and diag branch  . Carotid endarterectomy  01/15/08  . Lumbar disc surgery  1998  . Pv angio  11/22/2007    PTA/ stenting of right common iliac artery with a 10x69mm Smart stent and postdilated with a 7x59mmpowerflex balloon; high grade calcified stenosis of the RICA  . Colonoscopy N/A 07/09/2013    Procedure: COLONOSCOPY;  Surgeon: Juanita Craver, MD;  Location: [facility];  Service: Endoscopy;  Laterality: N/A;    History   Social History  . Marital Status: Married    Spouse Name: N/A    Number of Children: 2  . Years of Education: N/A   Occupational History  . Retired Estate manager/land agent    Social History Main Topics  . Smoking status: Current Every Day Smoker -- 2.00 packs/day for 56 years    Types: Cigarettes  . Smokeless tobacco: Never Used  . Alcohol Use: No  . Drug Use: No  . Sexual Activity: Not on file   Other Topics Concern  . Not on file   Social History Narrative   Lives with wife.  Family History  Problem Relation Age of Onset  . Heart failure Father     Allergies as of 04/08/2014 - Review Complete 04/08/2014  Allergen Reaction Noted  . Niaspan [niacin er] Hives 07/15/2012    Current Outpatient Prescriptions on File Prior to Visit  Medication Sig Dispense Refill  . amLODipine (NORVASC) 10 MG tablet Take 10 mg by mouth daily.    . Aspirin-Acetaminophen-Caffeine (GOODY HEADACHE PO) Take 1 packet by mouth every hour as needed (for pain).     [name] atorvastatin (LIPITOR) 80 MG tablet Take 80 mg by mouth at bedtime.     . carvedilol (COREG) 6.25 MG tablet Take 6.25 mg by mouth 2 (two) times daily with a meal.    . Cholecalciferol (VITAMIN D3) 5000  UNITS CAPS Take 5,000 Units by mouth daily.    [name] ezetimibe (ZETIA) 10 MG tablet Take 10 mg by mouth at bedtime.     . fenofibrate 160 MG tablet Take 160 mg by mouth at bedtime.     [name] losartan-hydrochlorothiazide (HYZAAR) 100-25 MG per tablet Take 1 tablet by mouth daily.    [name] omeprazole (PRILOSEC) 20 MG capsule Take 20 mg by mouth at bedtime.     . Saw Palmetto 450 MG CAPS Take 900 mg by mouth 2 (two) times daily.    . Saw Palmetto, Serenoa repens, (SAW PALMETTO PO) Take 2 tablets by mouth 2 (two) times daily.    . tamsulosin (FLOMAX) 0.4 MG CAPS capsule     . VIAGRA 100 MG tablet      No current facility-administered medications on file prior to visit.     REVIEW OF SYSTEMS: No changes from previous visit   PHYSICAL EXAMINATION:   Vital signs are BP 160/82 mmHg  Pulse 62  Ht 5\' 7"  (1.702 m)  Wt 197 lb (89.359 kg)  BMI 30.85 kg/m2  SpO2 98% General: The patient appears their stated age. HEENT:  No gross abnormalities Pulmonary:  Non labored breathing Abdomen: Soft and non-tender Musculoskeletal: There are no major deformities. Neurologic: No focal weakness or paresthesias are detected, Skin: There are no ulcer or rashes noted. Psychiatric: The patient has normal affect. Cardiovascular: There is a regular rate and rhythm without significant murmur appreciated.   Diagnostic Studies None  Assessment: Abdominal aortic aneurysm Left renal artery stenosis Lung nodule Plan: I discussed with the patient that I feel his best option for repair is an aortobifemoral bypass graft.  I do not feel his anatomy is suitable for endovascular repair.  I discussed the risks and benefits of the procedure including the risk of death, cardioplegic complications, intestinal and lower extremity ischemia.  The patient would like to get this done in January.  He understands the risk of rupture.  I would also plan on a left renal artery bypass.  He does have a history of renal insufficiency.  He is  currently being evaluated by nephrology.  Angiography revealed a high-grade left renal artery stenosis.   The patient has a nodule on CT scan which I will order a follow-up in 6 months.  I discussed this with the patient today.  Eldridge Abrahams, M.D. [facility] Office: [phone] Pager:  [phone]

## 2014-04-08 NOTE — Patient Instructions (Addendum)
Our surgery / procedure schedulers will be contacting you as soon as possible. They will explain to you all the details of what you need to do prior to your surgery or procedure. They will also give you information on where to check in, time of arrival and where to park.    If you have not talked to them within 2 weeks or just have more questions, please call [phone] and ask for either Antonietta Breach, or Zigmund Daniel.     Abdominal Aortic Aneurysm An aneurysm is a weakened or damaged part of an artery wall that bulges from the normal force of blood pumping through the body. An abdominal aortic aneurysm is an aneurysm that occurs in the lower part of the aorta, the main artery of the body.  The major concern with an abdominal aortic aneurysm is that it can enlarge and burst (rupture) or blood can flow between the layers of the wall of the aorta through a tear (aorticdissection). Both of these conditions can cause bleeding inside the body and can be life threatening unless diagnosed and treated promptly. CAUSES  The exact cause of an abdominal aortic aneurysm is unknown. Some contributing factors are:   A hardening of the arteries caused by the buildup of fat and other substances in the lining of a blood vessel (arteriosclerosis).  Inflammation of the walls of an artery (arteritis).   Connective tissue diseases, such as Marfan syndrome.   Abdominal trauma.   An infection, such as syphilis or staphylococcus, in the wall of the aorta (infectious aortitis) caused by bacteria. RISK FACTORS  Risk factors that contribute to an abdominal aortic aneurysm may include:  Age older than 54 years.   High blood pressure (hypertension).  Male gender.  Ethnicity (white race).  Obesity.  Family history of aneurysm (first degree relatives only).  Tobacco use. PREVENTION  The following healthy lifestyle habits may help decrease your risk of abdominal aortic aneurysm:  Quitting smoking.  Smoking can raise your blood pressure and cause arteriosclerosis.  Limiting or avoiding alcohol.  Keeping your blood pressure, blood sugar level, and cholesterol levels within normal limits.  Decreasing your salt intake. In somepeople, too much salt can raise blood pressure and increase your risk of abdominal aortic aneurysm.  Eating a diet low in saturated fats and cholesterol.  Increasing your fiber intake by including whole grains, vegetables, and fruits in your diet. Eating these foods may help lower blood pressure.  Maintaining a healthy weight.  Staying physically active and exercising regularly. SYMPTOMS  The symptoms of abdominal aortic aneurysm may vary depending on the size and rate of growth of the aneurysm.Most grow slowly and do not have any symptoms. When symptoms do occur, they may include:  Pain (abdomen, side, lower back, or groin). The pain may vary in intensity. A sudden onset of severe pain may indicate that the aneurysm has ruptured.  Feeling full after eating only small amounts of food.  Nausea or vomiting or both.  Feeling a pulsating lump in the abdomen.  Feeling faint or passing out. DIAGNOSIS  Since most unruptured abdominal aortic aneurysms have no symptoms, they are often discovered during diagnostic exams for other conditions. An aneurysm may be found during the following procedures:  Ultrasonography (A one-time screening for abdominal aortic aneurysm by ultrasonography is also recommended for all men aged 37-75 years who have ever smoked).  X-ray exams.  A computed tomography (CT).  Magnetic resonance imaging (MRI).  Angiography or arteriography. TREATMENT  Treatment of an  abdominal aortic aneurysm depends on the size of your aneurysm, your age, and risk factors for rupture. Medication to control blood pressure and pain may be used to manage aneurysms smaller than 6 cm. Regular monitoring for enlargement may be recommended by your caregiver  if:  The aneurysm is 3-4 cm in size (an annual ultrasonography may be recommended).  The aneurysm is 4-4.5 cm in size (an ultrasonography every 6 months may be recommended).  The aneurysm is larger than 4.5 cm in size (your caregiver may ask that you be examined by a vascular surgeon). If your aneurysm is larger than 6 cm, surgical repair may be recommended. There are two main methods for repair of an aneurysm:   Endovascular repair (a minimally invasive surgery). This is done most often.  Open repair. This method is used if an endovascular repair is not possible. Document Released: 02/17/2005 Document Revised: 09/04/2012 Document Reviewed: 06/09/2012 [facility] Patient Information 2015 [place], [place]. This information is not intended to replace advice given to you by your health care provider. Make sure you discuss any questions you have with your health care provider.

## 2014-04-15 ENCOUNTER — Ambulatory Visit (INDEPENDENT_AMBULATORY_CARE_PROVIDER_SITE_OTHER): Payer: BC Managed Care – PPO | Admitting: Cardiology

## 2014-04-15 ENCOUNTER — Encounter: Payer: Self-pay | Admitting: Cardiology

## 2014-04-15 ENCOUNTER — Other Ambulatory Visit: Payer: Self-pay

## 2014-04-15 VITALS — BP 175/95 | HR 65 | Ht 67.0 in | Wt 198.4 lb

## 2014-04-15 DIAGNOSIS — I2510 Atherosclerotic heart disease of native coronary artery without angina pectoris: Secondary | ICD-10-CM

## 2014-04-15 DIAGNOSIS — I6529 Occlusion and stenosis of unspecified carotid artery: Secondary | ICD-10-CM

## 2014-04-15 DIAGNOSIS — E785 Hyperlipidemia, unspecified: Secondary | ICD-10-CM

## 2014-04-15 DIAGNOSIS — Z79899 Other long term (current) drug therapy: Secondary | ICD-10-CM

## 2014-04-15 DIAGNOSIS — I714 Abdominal aortic aneurysm, without rupture, unspecified: Secondary | ICD-10-CM

## 2014-04-15 DIAGNOSIS — Z720 Tobacco use: Secondary | ICD-10-CM

## 2014-04-15 DIAGNOSIS — I119 Hypertensive heart disease without heart failure: Secondary | ICD-10-CM

## 2014-04-15 DIAGNOSIS — G4730 Sleep apnea, unspecified: Secondary | ICD-10-CM

## 2014-04-15 DIAGNOSIS — F17210 Nicotine dependence, cigarettes, uncomplicated: Secondary | ICD-10-CM

## 2014-04-15 DIAGNOSIS — N183 Chronic kidney disease, stage 3 unspecified: Secondary | ICD-10-CM

## 2014-04-15 LAB — BASIC METABOLIC PANEL
BUN: 37 mg/dL — ABNORMAL HIGH (ref 6–23)
CO2: 28 mEq/L (ref 19–32)
Calcium: 9.8 mg/dL (ref 8.4–10.5)
Chloride: 100 mEq/L (ref 96–112)
Creat: 2.1 mg/dL — ABNORMAL HIGH (ref 0.50–1.35)
Glucose, Bld: 94 mg/dL (ref 70–99)
Potassium: 4.4 mEq/L (ref 3.5–5.3)
Sodium: 140 mEq/L (ref 135–145)

## 2014-04-15 NOTE — Assessment & Plan Note (Signed)
Will recheck labs today, post procedure.

## 2014-04-15 NOTE — Assessment & Plan Note (Signed)
Discussed stopping.  He is aware he needs to and does plan to do so, but no specific date.

## 2014-04-15 NOTE — Assessment & Plan Note (Signed)
Lipid Panel     Component Value Date/Time   CHOL 117 02/16/2013 1016   TRIG 187* 02/16/2013 1016   HDL 25* 02/16/2013 1016   CHOLHDL 4.7 02/16/2013 1016   VLDL 37 02/16/2013 1016   LDLCALC 55 02/16/2013 1016   On lipitor 80 mg

## 2014-04-15 NOTE — Assessment & Plan Note (Signed)
Plan for surgical repair in Jan with Dr. [name]. Also bypass of renal artery with stensis

## 2014-04-15 NOTE — Assessment & Plan Note (Signed)
CABG 1996 by Dr. Nils Pyle with [facility] LAD, saphenous vein graft to OM1-OM 2, acute marginal, and diagonal- nuc last year at the [place], no chest pain.

## 2014-04-15 NOTE — Assessment & Plan Note (Signed)
BP is elevated, he is taking his meds, but he is concerned that his BP is too low at times, with dizziness.

## 2014-04-15 NOTE — Patient Instructions (Signed)
Your physician recommends that you return for lab work in: TODAY at [facility].  Decrease tobacco use to 10 a day (the ones you enjoy the most).  Your physician recommends that you schedule a follow-up appointment in: Dr. Gwenlyn Found in February 2016.

## 2014-04-15 NOTE — Progress Notes (Signed)
04/15/2014   PCP: [name], MD   Chief Complaint  Patient presents with  . Follow-up    pt denies chest pain, sob, and swelling    Primary Cardiologist:Dr. Adora Fridge   HPI:  68 year old moderately-overweight married Caucasian male, father of 2 and grandfather to 8 grandchildren, who was last seen in office by Dr. Adora Fridge in Sept. 2015. He has a history of CAD, status post coronary artery bypass grafting in 1996 with a LIMA to his LAD, sequential vein to OM1 and 2 as well as acute marginal branch and diagonal branch. He apparently did have a Myoview stress test a year ago at the [facility].he does continue to complain of claudication although his ABI is really haven't changed and the stents appear to be open.  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 [name] January 15, 2008. Carotid Dopplers performed in March of this year showed a widely patent endarterectomy site. He has had both iliacs stented as well as his SFAs in the past.. He has a large -sized abdominal aortic aneurysm measuring 5.5 x 5.5cm. We have been following this by duplex ultrasound. His other risk factors include hypertension, hyperlipidemia, and continued tobacco abuse of 2 packs a day, smoking cigars, recalcitrant to risk-factor modification. He has obstructive sleep apnea and is on CPAP.  Today he is back post PV angiography:  large infrarenal abdominal aortic aneurysm, a large saccular aneurysm beginning approximately 2 cm below the takeoff of the renal arteries and extending to the iliac bifurcation. . High-grade left renal artery stenosis, patent iliac stents.     He saw Dr. [name] on the 16th of this month.  He felt best option for repair is an aortobifemoral bypass graft.   Plan is to proceed in Jan. Renal artery bypass would be planned at that time as well.    He has seen Pulmonary as well.  Will need follow up CT chest in 6-12 months.   Pt has  renal insufficiency and is waiting to see nephrologists through [place] system.  We will check labs today as he is post PV cath.  No complaints today.  He does have occ episode of dizziness.  Occurs maybe once a month or once every 6 months.  Yesterday he had 3 episodes and diastolic BP was 55. He could not tell me the systolic.  Today his BP is elevated, but he is reluctant to increase his meds with episodes of dizziness.    He continues to smoke, he has tried multiple ways to stop but has been unable to do so.  We discussed not smoking in his home or car.  He stated he would try.      Allergies  Allergen Reactions  . Niaspan [Niacin Er] Hives    Current Outpatient Prescriptions  Medication Sig Dispense Refill  . amLODipine (NORVASC) 10 MG tablet Take 10 mg by mouth daily.    . Aspirin-Acetaminophen-Caffeine (GOODY HEADACHE PO) Take 1 packet by mouth every hour as needed (for pain).     [name] atorvastatin (LIPITOR) 80 MG tablet Take 80 mg by mouth at bedtime.     . carvedilol (COREG) 6.25 MG tablet Take 6.25 mg by mouth 2 (two) times daily with a meal.    . Cholecalciferol (VITAMIN D3) 5000 UNITS CAPS Take 5,000 Units by mouth daily.    [name] ezetimibe (ZETIA) 10 MG tablet Take 10 mg  by mouth at bedtime.     . fenofibrate 160 MG tablet Take 160 mg by mouth at bedtime.     [name] losartan-hydrochlorothiazide (HYZAAR) 100-25 MG per tablet Take 1 tablet by mouth daily.    [name] omeprazole (PRILOSEC) 20 MG capsule Take 20 mg by mouth at bedtime.     . Saw Palmetto 450 MG CAPS Take 900 mg by mouth 2 (two) times daily.     No current facility-administered medications for this visit.    Past Medical History  Diagnosis Date  . CAD (coronary artery disease)     myoview 04/21/11-normal, EF49%  . S/P CABG (coronary artery bypass graft) 1996  . Hypertension   . Hyperlipemia   . Carotid stenosis 01/15/08    right endarterectomy-Dr. Amedeo Plenty  . PAD (peripheral artery disease) 06/12/09    a. 11/22/07 PTA & stenting  right external iliac artery;bilateral iliac & PTI and stenting 1997;right ICA =>50% reduction,right SFA >50%,left ICA at stent => 50% reduction,left EIA => 50% reduction,left ATA occluded, left SFA mid > 49% reduction;  03/2014 Angio: LRA 90, patent L Iliac stent and distal RCIA stent, large saccular AAA.  [name] Abdominal aortic aneurysm 01/28/10; 02/05/14    4.3x4.6cm;  now measuring 5.5 cm  . Polycythemia     H/O  . GERD (gastroesophageal reflux disease)   . Tobacco abuse   . OSA on CPAP   . CKD (chronic kidney disease), stage III     Past Surgical History  Procedure Laterality Date  . Coronary artery bypass graft  1996    LIMA to LAD, sequential vein to OM1 and 2 as well as acure marginal branch and diag branch  . Carotid endarterectomy  01/15/08  . Lumbar disc surgery  1998  . Pv angio  11/22/2007    PTA/ stenting of right common iliac artery with a 10x39mm Smart stent and postdilated with a 7x37mmpowerflex balloon; high grade calcified stenosis of the RICA  . Colonoscopy N/A 07/09/2013    Procedure: COLONOSCOPY;  Surgeon: Juanita Craver, MD;  Location: [facility];  Service: Endoscopy;  Laterality: N/A;  . Pv angio  04/01/14    AAA, Lt renal artery stenosis, patent iliacs    [id number] colds or fevers, no weight changes Skin:no rashes or ulcers HEENT:no blurred vision, no congestion CV:see HPI PUL:see HPI GI:no diarrhea constipation or melena, no indigestion GU:no hematuria, no dysuria MS:no joint pain, no claudication Neuro:no syncope, + lightheadedness on occ. Endo:no diabetes, no thyroid disease  Wt Readings from Last 3 Encounters:  04/15/14 198 lb 6.4 oz (89.994 kg)  04/08/14 197 lb (89.359 kg)  04/01/14 192 lb 0.3 oz (87.1 kg)    PHYSICAL EXAM BP 175/95 mmHg  Pulse 65  Ht 5\' 7"  (1.702 m)  Wt 198 lb 6.4 oz (89.994 kg)  BMI 31.07 kg/m2 General:Pleasant affect, NAD Skin:Warm and dry, brisk capillary refill HEENT:normocephalic, sclera clear, mucus membranes  moist Neck:supple, no JVD, + bruit on rt.  Heart:S1S2 RRR without murmur, gallup, rub or click Lungs:clear without rales, occ rhonchi, no wheezes [id number], soft, non tender, + BS, do not palpate liver spleen or masses Ext:no lower ext edema, 2+ pedal pulses, 2+ radial pulses Neuro:alert and oriented, MAE, follows commands, + facial symmetry   ASSESSMENT AND PLAN Abdominal aortic aneurysm without rupture Plan for surgical repair in Jan with Dr. [name]. Also bypass of renal artery with stensis  CKD (chronic kidney disease), stage III Will recheck labs today, post procedure.  Hyperlipidemia Lipid Panel  Component Value Date/Time   CHOL 117 02/16/2013 1016   TRIG 187* 02/16/2013 1016   HDL 25* 02/16/2013 1016   CHOLHDL 4.7 02/16/2013 1016   VLDL 37 02/16/2013 1016   LDLCALC 55 02/16/2013 1016   On lipitor 80 mg  Cigarette smoker Discussed stopping.  He is aware he needs to and does plan to do so, but no specific date.  Sleep apnea Wears sleep apnea  CAD (coronary artery disease) CABG 1996 by Dr. Nils Pyle with [facility] LAD, saphenous vein graft to OM1-OM 2, acute marginal, and diagonal- nuc last year at the [place], no chest pain.  Hypertensive heart disease BP is elevated, he is taking his meds, but he is concerned that his BP is too low at times, with dizziness.   Follow up with Dr. Adora Fridge in Feb.

## 2014-04-15 NOTE — Assessment & Plan Note (Signed)
Wears sleep apnea

## 2014-04-16 ENCOUNTER — Other Ambulatory Visit: Payer: Self-pay | Admitting: *Deleted

## 2014-04-16 ENCOUNTER — Telehealth: Payer: Self-pay | Admitting: *Deleted

## 2014-04-16 DIAGNOSIS — R7989 Other specified abnormal findings of blood chemistry: Secondary | ICD-10-CM

## 2014-04-16 DIAGNOSIS — I739 Peripheral vascular disease, unspecified: Secondary | ICD-10-CM

## 2014-04-16 DIAGNOSIS — Z01818 Encounter for other preprocedural examination: Secondary | ICD-10-CM

## 2014-04-16 NOTE — Telephone Encounter (Signed)
-----   Message from Isaiah Serge, NP sent at 04/15/2014  7:31 PM EST ----- Mild increase of Cr after PV angio, recheck in 2 weeks.

## 2014-04-16 NOTE — Telephone Encounter (Deleted)
-----   Message from Isaiah Serge, NP sent at 04/15/2014  7:31 PM EST ----- Mild increase of Cr after PV angio, recheck in 2 weeks.

## 2014-04-16 NOTE — Telephone Encounter (Signed)
I received a message from Barry Lawrence and Dr Gwenlyn Found that patient needs a lexiscan myoview prior to his surgery in Jan with Dr [name].  I called patient and he was agreeable to proceed. Order placed.

## 2014-04-16 NOTE — Telephone Encounter (Signed)
Pt notified of elevation. Will recheck in 2 weeks. Will mail pt lab slips. Verbalized understanding to come in and have lab work repeated.

## 2014-04-29 NOTE — Telephone Encounter (Signed)
myoview scheduled for 05/02/14

## 2014-04-30 ENCOUNTER — Telehealth (HOSPITAL_COMMUNITY): Payer: Self-pay

## 2014-04-30 NOTE — Telephone Encounter (Signed)
Encounter complete. 

## 2014-05-02 ENCOUNTER — Ambulatory Visit (HOSPITAL_COMMUNITY)
Admission: RE | Admit: 2014-05-02 | Discharge: 2014-05-02 | Disposition: A | Discharge status: Home / Self Care | Payer: BC Managed Care – PPO | Source: Ambulatory Visit | Attending: Cardiovascular Disease | Admitting: Cardiovascular Disease

## 2014-05-02 ENCOUNTER — Encounter (HOSPITAL_COMMUNITY): Payer: Self-pay | Admitting: Cardiovascular Disease

## 2014-05-02 DIAGNOSIS — I2510 Atherosclerotic heart disease of native coronary artery without angina pectoris: Secondary | ICD-10-CM | POA: Diagnosis not present

## 2014-05-02 DIAGNOSIS — Z01810 Encounter for preprocedural cardiovascular examination: Secondary | ICD-10-CM | POA: Diagnosis not present

## 2014-05-02 DIAGNOSIS — J449 Chronic obstructive pulmonary disease, unspecified: Secondary | ICD-10-CM | POA: Insufficient documentation

## 2014-05-02 DIAGNOSIS — Z951 Presence of aortocoronary bypass graft: Secondary | ICD-10-CM | POA: Diagnosis not present

## 2014-05-02 DIAGNOSIS — I739 Peripheral vascular disease, unspecified: Secondary | ICD-10-CM | POA: Diagnosis not present

## 2014-05-02 DIAGNOSIS — Z955 Presence of coronary angioplasty implant and graft: Secondary | ICD-10-CM | POA: Insufficient documentation

## 2014-05-02 DIAGNOSIS — I252 Old myocardial infarction: Secondary | ICD-10-CM | POA: Diagnosis present

## 2014-05-02 DIAGNOSIS — Z01818 Encounter for other preprocedural examination: Secondary | ICD-10-CM

## 2014-05-02 MED ORDER — TECHNETIUM TC 99M SESTAMIBI GENERIC - CARDIOLITE
30.0000 | Freq: Once | INTRAVENOUS | Status: AC | PRN
  Administered 2014-05-02: 30 via INTRAVENOUS

## 2014-05-02 MED ORDER — AMINOPHYLLINE 25 MG/ML IV SOLN
75.0000 mg | Freq: Once | INTRAVENOUS | Status: AC
  Administered 2014-05-02: 75 mg via INTRAVENOUS

## 2014-05-02 MED ORDER — REGADENOSON 0.4 MG/5ML IV SOLN
0.4000 mg | Freq: Once | INTRAVENOUS | Status: AC
  Administered 2014-05-02: 0.4 mg via INTRAVENOUS

## 2014-05-02 MED ORDER — TECHNETIUM TC 99M SESTAMIBI GENERIC - CARDIOLITE
10.0000 | Freq: Once | INTRAVENOUS | Status: AC | PRN
  Administered 2014-05-02: 10 via INTRAVENOUS

## 2014-05-02 NOTE — Procedures (Addendum)
[facility] [address] [place] [address] [id number]  Cardiology Nuclear Med Study  Barry Lawrence is a 68 y.o. male     MRN : [id number]     DOB: 1945-12-14  Procedure Date: 05/02/2014  Nuclear Med Background Indication for Stress Test:  Surgical Clearance, Graft Patency and Post Hospital History:  COPD and CAD;MI-1996;CABG X5-1996;STENT/PTCA X2;Last NUC MPI on 02/18/2009-nonischemic;EF=57% Cardiac Risk Factors: Carotid Disease, Family History - CAD, Hypertension, Lipids, PVD and Smoker  Symptoms:  Chest Pain, Dizziness, DOE, Light-Headedness, Near Syncope and SOB   Nuclear Pre-Procedure Caffeine/Decaff Intake:  7:00pm NPO After: 5:00am   IV Site: R Hand  IV 0.9% NS with Angio Cath:  22g  Chest Size (in):  46"  IV Started by: Rolene Course, RN  Height: 5\' 7"  (1.702 m)  Cup Size: n/a  BMI:  Body mass index is 31 kg/(m^2). Weight:  198 lb (89.812 kg)   Tech Comments:  n/a    Nuclear Med Study 1 or 2 day study: 1 day  Stress Test Type:  [place]  Order Authorizing Provider:  Quay Burow, MD   Resting Radionuclide: Technetium 79m Sestamibi  Resting Radionuclide Dose: 10.9 mCi   Stress Radionuclide:  Technetium 87m Sestamibi  Stress Radionuclide Dose: 30.7 mCi           Stress Protocol Rest HR: 61 Stress HR: 72  Rest BP: 138/87 Stress BP: 154/70  Exercise Time (min): n/a METS: n/a   Predicted Max HR: 153 bpm % Max HR: 53.59 bpm Rate Pressure Product: 16072  Dose of Adenosine (mg):  n/a Dose of Lexiscan: 0.4 mg  Dose of Atropine (mg): n/a Dose of Dobutamine: n/a mcg/kg/min (at max HR)  Stress Test Technologist: Leane Para, CCT Nuclear Technologist: Otho Perl, CNMT   Rest Procedure:  Myocardial perfusion imaging was performed at rest 45 minutes following the intravenous administration of Technetium 81m Sestamibi. Stress Procedure:  The patient received IV Lexiscan 0.4 mg over 15-seconds.   Technetium 65m Sestamibi injected IV at 30-seconds.  Patient experienced SOB and mild Nausea and 75 mg Aminophylline IV was administered.There were no significant changes with Lexiscan.  Quantitative spect images were obtained after a 45 minute delay.  Transient Ischemic Dilatation (Normal <1.22):  0.92 QGS EDV:  149 ml QGS ESV:  80 ml LV Ejection Fraction: 46%  Rest ECG: NSR-RBBB  Stress ECG: No significant change from baseline ECG  QPS Raw Data Images:  There is no interference from nuclear activity from structures below the diaphragm.   Stress Images:  Normal homogeneous uptake in all areas of the myocardium. Rest Images:  Normal homogeneous uptake in all areas of the myocardium. Subtraction (SDS):  No evidence of ischemia.  Impression Exercise Capacity:  Lexiscan with no exercise. BP Response:  Normal blood pressure response. Clinical Symptoms:  There is dyspnea. ECG Impression:  No significant ECG changes with Lexiscan. Comparison with Prior Nuclear Study: No significant change from previous study  Overall Impression:  Low risk stress nuclear study without reversible ischemia. There is basal inferior fixed bowel attenuation artifact..  LV Wall Motion:  NL LV Function; NL Wall Motion; EF 46% (however, there is inferior attenuation artifact which may lead to a falsely lower EF)  Pixie Casino, MD, [facility] Board Certified in Nuclear Cardiology Attending Cardiologist Dawn, MD  05/02/2014 1:07 PM

## 2014-05-03 LAB — BASIC METABOLIC PANEL
BUN: 36 mg/dL — ABNORMAL HIGH (ref 6–23)
CO2: 28 mEq/L (ref 19–32)
Calcium: 9.8 mg/dL (ref 8.4–10.5)
Chloride: 98 mEq/L (ref 96–112)
Creat: 2.02 mg/dL — ABNORMAL HIGH (ref 0.50–1.35)
Glucose, Bld: 102 mg/dL — ABNORMAL HIGH (ref 70–99)
Potassium: 4.2 mEq/L (ref 3.5–5.3)
Sodium: 137 mEq/L (ref 135–145)

## 2014-05-09 ENCOUNTER — Encounter: Payer: Self-pay | Admitting: *Deleted

## 2014-05-22 ENCOUNTER — Encounter (HOSPITAL_COMMUNITY)
Admission: RE | Admit: 2014-05-22 | Discharge: 2014-05-22 | Disposition: A | Discharge status: Home / Self Care | Payer: BC Managed Care – PPO | Source: Ambulatory Visit | Attending: Surgery | Admitting: Surgery

## 2014-05-22 ENCOUNTER — Encounter (HOSPITAL_COMMUNITY): Payer: Self-pay

## 2014-05-22 DIAGNOSIS — Z951 Presence of aortocoronary bypass graft: Secondary | ICD-10-CM | POA: Insufficient documentation

## 2014-05-22 DIAGNOSIS — M5136 Other intervertebral disc degeneration, lumbar region: Secondary | ICD-10-CM | POA: Insufficient documentation

## 2014-05-22 DIAGNOSIS — I129 Hypertensive chronic kidney disease with stage 1 through stage 4 chronic kidney disease, or unspecified chronic kidney disease: Secondary | ICD-10-CM | POA: Diagnosis not present

## 2014-05-22 DIAGNOSIS — K219 Gastro-esophageal reflux disease without esophagitis: Secondary | ICD-10-CM | POA: Insufficient documentation

## 2014-05-22 DIAGNOSIS — E785 Hyperlipidemia, unspecified: Secondary | ICD-10-CM | POA: Insufficient documentation

## 2014-05-22 DIAGNOSIS — I701 Atherosclerosis of renal artery: Secondary | ICD-10-CM | POA: Insufficient documentation

## 2014-05-22 DIAGNOSIS — D175 Benign lipomatous neoplasm of intra-abdominal organs: Secondary | ICD-10-CM | POA: Diagnosis not present

## 2014-05-22 DIAGNOSIS — Z95828 Presence of other vascular implants and grafts: Secondary | ICD-10-CM | POA: Insufficient documentation

## 2014-05-22 DIAGNOSIS — I739 Peripheral vascular disease, unspecified: Secondary | ICD-10-CM | POA: Insufficient documentation

## 2014-05-22 DIAGNOSIS — K8020 Calculus of gallbladder without cholecystitis without obstruction: Secondary | ICD-10-CM | POA: Diagnosis not present

## 2014-05-22 DIAGNOSIS — I6529 Occlusion and stenosis of unspecified carotid artery: Secondary | ICD-10-CM | POA: Diagnosis not present

## 2014-05-22 DIAGNOSIS — N183 Chronic kidney disease, stage 3 (moderate): Secondary | ICD-10-CM | POA: Diagnosis not present

## 2014-05-22 DIAGNOSIS — I2510 Atherosclerotic heart disease of native coronary artery without angina pectoris: Secondary | ICD-10-CM | POA: Diagnosis not present

## 2014-05-22 DIAGNOSIS — R911 Solitary pulmonary nodule: Secondary | ICD-10-CM | POA: Insufficient documentation

## 2014-05-22 DIAGNOSIS — R531 Weakness: Secondary | ICD-10-CM | POA: Diagnosis not present

## 2014-05-22 DIAGNOSIS — E119 Type 2 diabetes mellitus without complications: Secondary | ICD-10-CM | POA: Insufficient documentation

## 2014-05-22 DIAGNOSIS — M47896 Other spondylosis, lumbar region: Secondary | ICD-10-CM | POA: Insufficient documentation

## 2014-05-22 DIAGNOSIS — I714 Abdominal aortic aneurysm, without rupture: Secondary | ICD-10-CM | POA: Insufficient documentation

## 2014-05-22 DIAGNOSIS — Z01818 Encounter for other preprocedural examination: Secondary | ICD-10-CM | POA: Insufficient documentation

## 2014-05-22 DIAGNOSIS — F17210 Nicotine dependence, cigarettes, uncomplicated: Secondary | ICD-10-CM | POA: Insufficient documentation

## 2014-05-22 DIAGNOSIS — I712 Thoracic aortic aneurysm, without rupture: Secondary | ICD-10-CM | POA: Insufficient documentation

## 2014-05-22 DIAGNOSIS — G4733 Obstructive sleep apnea (adult) (pediatric): Secondary | ICD-10-CM | POA: Diagnosis not present

## 2014-05-22 DIAGNOSIS — R51 Headache: Secondary | ICD-10-CM | POA: Diagnosis not present

## 2014-05-22 DIAGNOSIS — I517 Cardiomegaly: Secondary | ICD-10-CM | POA: Diagnosis not present

## 2014-05-22 DIAGNOSIS — D751 Secondary polycythemia: Secondary | ICD-10-CM | POA: Insufficient documentation

## 2014-05-22 HISTORY — DX: Headache: R51

## 2014-05-22 HISTORY — DX: Pneumonia, unspecified organism: J18.9

## 2014-05-22 HISTORY — DX: Type 2 diabetes mellitus without complications: E11.9

## 2014-05-22 HISTORY — DX: Acute myocardial infarction, unspecified: I21.9

## 2014-05-22 HISTORY — DX: Headache, unspecified: R51.9

## 2014-05-22 HISTORY — DX: Unspecified osteoarthritis, unspecified site: M19.90

## 2014-05-22 LAB — URINE MICROSCOPIC-ADD ON

## 2014-05-22 LAB — BLOOD GAS, ARTERIAL
Acid-Base Excess: 2.4 mmol/L — ABNORMAL HIGH (ref 0.0–2.0)
Bicarbonate: 26.6 mEq/L — ABNORMAL HIGH (ref 20.0–24.0)
Drawn by: 20636
FIO2: 0.21 %
O2 Saturation: 95.3 %
Patient temperature: 98.6
TCO2: 27.9 mmol/L (ref 0–100)
pCO2 arterial: 42.5 mmHg (ref 35.0–45.0)
pH, Arterial: 7.413 (ref 7.350–7.450)
pO2, Arterial: 76.3 mmHg — ABNORMAL LOW (ref 80.0–100.0)

## 2014-05-22 LAB — COMPREHENSIVE METABOLIC PANEL
ALT: 21 U/L (ref 0–53)
AST: 20 U/L (ref 0–37)
Albumin: 4 g/dL (ref 3.5–5.2)
Alkaline Phosphatase: 32 U/L — ABNORMAL LOW (ref 39–117)
Anion gap: 11 (ref 5–15)
BUN: 30 mg/dL — ABNORMAL HIGH (ref 6–23)
CO2: 23 mmol/L (ref 19–32)
Calcium: 9.9 mg/dL (ref 8.4–10.5)
Chloride: 100 mEq/L (ref 96–112)
Creatinine, Ser: 1.87 mg/dL — ABNORMAL HIGH (ref 0.50–1.35)
GFR calc Af Amer: 41 mL/min — ABNORMAL LOW (ref >90)
GFR calc non Af Amer: 36 mL/min — ABNORMAL LOW (ref >90)
Glucose, Bld: 100 mg/dL — ABNORMAL HIGH (ref 70–99)
Potassium: 4.4 mmol/L (ref 3.5–5.1)
Sodium: 134 mmol/L — ABNORMAL LOW (ref 135–145)
Total Bilirubin: 0.8 mg/dL (ref 0.3–1.2)
Total Protein: 7 g/dL (ref 6.0–8.3)

## 2014-05-22 LAB — URINALYSIS, ROUTINE W REFLEX MICROSCOPIC
Bilirubin Urine: NEGATIVE
Glucose, UA: NEGATIVE mg/dL
Hgb urine dipstick: NEGATIVE
Ketones, ur: NEGATIVE mg/dL
Leukocytes, UA: NEGATIVE
Nitrite: NEGATIVE
Protein, ur: 100 mg/dL — AB
Specific Gravity, Urine: 1.01 (ref 1.005–1.030)
Urobilinogen, UA: 0.2 mg/dL (ref 0.0–1.0)
pH: 7 (ref 5.0–8.0)

## 2014-05-22 LAB — SURGICAL PCR SCREEN
MRSA, PCR: NEGATIVE
Staphylococcus aureus: NEGATIVE

## 2014-05-22 LAB — CBC
HCT: 52.5 % — ABNORMAL HIGH (ref 39.0–52.0)
Hemoglobin: 18.5 g/dL — ABNORMAL HIGH (ref 13.0–17.0)
MCH: 32.3 pg (ref 26.0–34.0)
MCHC: 35.2 g/dL (ref 30.0–36.0)
MCV: 91.6 fL (ref 78.0–100.0)
Platelets: 207 10*3/uL (ref 150–400)
RBC: 5.73 MIL/uL (ref 4.22–5.81)
RDW: 14.4 % (ref 11.5–15.5)
WBC: 8.9 10*3/uL (ref 4.0–10.5)

## 2014-05-22 LAB — APTT: aPTT: 39 seconds — ABNORMAL HIGH (ref 24–37)

## 2014-05-22 LAB — PROTIME-INR
INR: 1.06 (ref 0.00–1.49)
Prothrombin Time: 13.9 seconds (ref 11.6–15.2)

## 2014-05-22 NOTE — Pre-Procedure Instructions (Signed)
Barry Lawrence  05/22/2014   Your procedure is scheduled on:  05/30/2013  Report to [facility] Admitting   ENTRANCE A  at 5:30 AM.  Call this number if you have problems the morning of surgery: [phone]   Remember:   Do not eat food or drink liquids after midnight. On Wednesday   Take these medicines the morning of surgery with A SIP OF WATER: Norvasc   Do not wear jewelry   Do not wear lotions, powders, or perfumes. You may wear deodorant.   Men may shave face and neck.   Do not bring valuables to the hospital.  [facility] is not responsible                  for any belongings or valuables.               Contacts, dentures or bridgework may not be worn into surgery.   Leave suitcase in the car. After surgery it may be brought to your room.   For patients admitted to the hospital, discharge time is determined by your                treatment team.               Patients discharged the day of surgery will not be allowed to drive  home.  Name and phone number of your driver: with wife  Special Instructions: Special Instructions: [place] - Preparing for Surgery  Before surgery, you can play an important role.  Because skin is not sterile, your skin needs to be as free of germs as possible.  You can reduce the number of germs on you skin by washing with CHG (chlorahexidine gluconate) soap before surgery.  CHG is an antiseptic cleaner which kills germs and bonds with the skin to continue killing germs even after washing.  Please DO NOT use if you have an allergy to CHG or antibacterial soaps.  If your skin becomes reddened/irritated stop using the CHG and inform your nurse when you arrive at Short Stay.  Do not shave (including legs and underarms) for at least 48 hours prior to the first CHG shower.  You may shave your face.  Please follow these instructions carefully:   1.  Shower with CHG Soap the night before surgery and the  morning of Surgery.  2.  If you  choose to wash your hair, wash your hair first as usual with your  normal shampoo.  3.  After you shampoo, rinse your hair and body thoroughly to remove the  Shampoo.  4.  Use CHG as you would any other liquid soap.  You can apply chg directly to the skin and wash gently with scrungie or a clean washcloth.  5.  Apply the CHG Soap to your body ONLY FROM THE NECK DOWN.    Do not use on open wounds or open sores.  Avoid contact with your eyes, ears, mouth and genitals (private parts).  Wash genitals (private parts)   with your normal soap.  6.  Wash thoroughly, paying special attention to the area where your surgery will be performed.  7.  Thoroughly rinse your body with warm water from the neck down.  8.  DO NOT shower/wash with your normal soap after using and rinsing off   the CHG Soap.  9.  Pat yourself dry with a clean towel.  10.  Wear clean pajamas.            11.  Place clean sheets on your bed the night of your first shower and do not sleep with pets.  Day of Surgery  Do not apply any lotions/deodorants the morning of surgery.  Please wear clean clothes to the hospital/surgery center.   Please read over the following fact sheets that you were given: Pain Booklet, Coughing and Deep Breathing, Blood Transfusion Information, MRSA Information and Surgical Site Infection Prevention

## 2014-05-22 NOTE — Progress Notes (Signed)
PCP- F. Wilson, Cardiac followed by Dr. Adora Fridge- recently had aortagram & stress test.  Pt. Seen for pulmonary eval. By Dr. Melvyn Novas, /w PFT's, pt. Continues to smoke, reports that due to leg weakness is not able to walk (distance) like he use to.  Pt. Aware of need to lessen & then stop [employer].

## 2014-05-23 ENCOUNTER — Encounter (HOSPITAL_COMMUNITY): Payer: Self-pay

## 2014-05-23 NOTE — Progress Notes (Signed)
Anesthesia Chart Review:  Patient is a 68 year old male scheduled for AFBG with left renal artery bypass on 05/30/14 by Dr. [name].  History includes AAA 5.6 cm and ascending TAA 4.2 cm, PAD s/p bilateral iliac and SFA stents, high grade left RA stenosis, smoking, HLD, CAD s/p CABG '96, polycythemia, GERD, CKD stage III, OSA on CPAP, DM2, HTN, headaches, carotid artery stenosis s/p right CEA '09, lumbar disc surgery, left lung nodule (for f/u per Dr. Melvyn Novas 08/2014). PCP is Dr. Kathryne Eriksson.  Cardiologist is Dr. Quay Burow.  Pulmonologist is Dr. Christinia Gully who saw him for a preoperative evaluation on 03/29/14.  Ideally patient should stop smoking 2 weeks prior to surgery and stated, "The main challenge with surgery will be getting his wt off his diaphragms as soon as possible post op perhaps with a bipap 'bridge' if needed but is cleared for surgery from my perspective."  Nuclear stress test 05/02/14: Overall Impression: Low risk stress nuclear study without reversible ischemia. There is basal inferior fixed bowel attenuation artifact. LV Wall Motion: NL LV Function; NL Wall Motion; EF 46% (however, there is inferior attenuation artifact which may lead to a falsely lower EF).  Chest CT 03/05/14: 1. Ascending thoracic aortic aneurysm with maximum dimension of the proximal and mid ascending thoracic aorta 4.2 cm. 2. Fusiform infrarenal abdominal aortic aneurysm is 8.6 cm in length, extends to the bifurcation, and measures 5.6 cm transverse by 5.5 cm anterior-posterior. There is chronic of marginal intramural thrombus within this aneurysm. Ectatic common iliac arteries as noted above. 3. Prior CABG with dense calcification of the native coronary arteries. 4. Mild cardiomegaly. 5. 6 mm pulmonary nodule at the left lung apex. If the patient is at high risk for bronchogenic carcinoma, follow-up chest CT at 6-12 months is recommended. If the patient is at low risk for bronchogenic carcinoma, follow-up chest  CT at 12 months is recommended. This recommendation follows the consensus statement: Guidelines for Management of Small Pulmonary Nodules Detected on CT Scans: A Statement from the [place] as published in Radiology 2005;237:395-400. 6. Bilateral myelolipomas of the adrenal glands, larger on the left. 7. Cholelithiasis. 8. Ancillary findings include prominent prostate gland and lumbar spondylosis and degenerative disc disease.  03/26/2014 spirometry FVC 2.23 (55%), FEV1 1.53 (51%) ratio 69 off all rx.   Preoperative labs noted.  Cr 1.87, stable since 02/2014. Known CKD stage III. H/H 18.5/52.5, stable since 11/2007. Known polycythemia. PT/INR WNL. PTT 39. Glucose 100. T&S done.  He had a recent low risk stress test and pulmonology clearance.  Labs appears stable. If no acute changes then I anticipate that he can proceed as planned.  [facility] Short Stay Center/Anesthesiology Phone [phone] 05/23/2014 10:54 AM

## 2014-05-29 MED ORDER — SODIUM CHLORIDE 0.9 % IV SOLN: INTRAVENOUS | Status: DC

## 2014-05-29 MED ORDER — CHLORHEXIDINE GLUCONATE CLOTH 2 % EX PADS
6.0000 | MEDICATED_PAD | Freq: Once | CUTANEOUS | Status: DC
Start: 2014-05-29 — End: 2014-05-30

## 2014-05-29 MED ORDER — CHLORHEXIDINE GLUCONATE CLOTH 2 % EX PADS: 6.0000 | MEDICATED_PAD | Freq: Once | CUTANEOUS | Status: DC

## 2014-05-29 MED ORDER — DEXTROSE 5 % IV SOLN
1.5000 g | INTRAVENOUS | Status: AC
  Administered 2014-05-30 (×2): 1.5 g via INTRAVENOUS
  Filled 2014-05-29: qty 1.5

## 2014-05-30 ENCOUNTER — Inpatient Hospital Stay (HOSPITAL_COMMUNITY): Payer: Medicare Other

## 2014-05-30 ENCOUNTER — Inpatient Hospital Stay (HOSPITAL_COMMUNITY): Payer: Medicare Other | Admitting: Vascular Surgery

## 2014-05-30 ENCOUNTER — Encounter (HOSPITAL_COMMUNITY)
Admission: RE | Disposition: A | Discharge status: Home Health | Payer: Medicare Other | Source: Ambulatory Visit | Attending: Surgery

## 2014-05-30 ENCOUNTER — Inpatient Hospital Stay (HOSPITAL_COMMUNITY): Payer: Medicare Other | Admitting: Certified Registered"

## 2014-05-30 ENCOUNTER — Inpatient Hospital Stay (HOSPITAL_COMMUNITY)
Admission: RE | Admit: 2014-05-30 | Discharge: 2014-06-28 | DRG: 268 | Disposition: A | Discharge status: Home Health | Payer: Medicare Other | Source: Ambulatory Visit | Attending: Surgery | Admitting: Surgery

## 2014-05-30 ENCOUNTER — Encounter (HOSPITAL_COMMUNITY): Payer: Self-pay | Admitting: *Deleted

## 2014-05-30 DIAGNOSIS — Z452 Encounter for adjustment and management of vascular access device: Secondary | ICD-10-CM | POA: Insufficient documentation

## 2014-05-30 DIAGNOSIS — Z951 Presence of aortocoronary bypass graft: Secondary | ICD-10-CM

## 2014-05-30 DIAGNOSIS — R571 Hypovolemic shock: Secondary | ICD-10-CM | POA: Diagnosis not present

## 2014-05-30 DIAGNOSIS — N184 Chronic kidney disease, stage 4 (severe): Secondary | ICD-10-CM | POA: Diagnosis present

## 2014-05-30 DIAGNOSIS — E8770 Fluid overload, unspecified: Secondary | ICD-10-CM | POA: Diagnosis not present

## 2014-05-30 DIAGNOSIS — L089 Local infection of the skin and subcutaneous tissue, unspecified: Secondary | ICD-10-CM

## 2014-05-30 DIAGNOSIS — Z8709 Personal history of other diseases of the respiratory system: Secondary | ICD-10-CM

## 2014-05-30 DIAGNOSIS — J9811 Atelectasis: Secondary | ICD-10-CM

## 2014-05-30 DIAGNOSIS — T8130XA Disruption of wound, unspecified, initial encounter: Secondary | ICD-10-CM | POA: Diagnosis not present

## 2014-05-30 DIAGNOSIS — E780 Pure hypercholesterolemia: Secondary | ICD-10-CM | POA: Diagnosis present

## 2014-05-30 DIAGNOSIS — F17210 Nicotine dependence, cigarettes, uncomplicated: Secondary | ICD-10-CM | POA: Diagnosis present

## 2014-05-30 DIAGNOSIS — I509 Heart failure, unspecified: Secondary | ICD-10-CM

## 2014-05-30 DIAGNOSIS — M1990 Unspecified osteoarthritis, unspecified site: Secondary | ICD-10-CM | POA: Diagnosis present

## 2014-05-30 DIAGNOSIS — K219 Gastro-esophageal reflux disease without esophagitis: Secondary | ICD-10-CM | POA: Diagnosis present

## 2014-05-30 DIAGNOSIS — I9789 Other postprocedural complications and disorders of the circulatory system, not elsewhere classified: Secondary | ICD-10-CM | POA: Diagnosis not present

## 2014-05-30 DIAGNOSIS — J449 Chronic obstructive pulmonary disease, unspecified: Secondary | ICD-10-CM | POA: Diagnosis present

## 2014-05-30 DIAGNOSIS — I129 Hypertensive chronic kidney disease with stage 1 through stage 4 chronic kidney disease, or unspecified chronic kidney disease: Secondary | ICD-10-CM | POA: Diagnosis present

## 2014-05-30 DIAGNOSIS — E119 Type 2 diabetes mellitus without complications: Secondary | ICD-10-CM | POA: Diagnosis present

## 2014-05-30 DIAGNOSIS — D751 Secondary polycythemia: Secondary | ICD-10-CM | POA: Diagnosis present

## 2014-05-30 DIAGNOSIS — K668 Other specified disorders of peritoneum: Secondary | ICD-10-CM

## 2014-05-30 DIAGNOSIS — R451 Restlessness and agitation: Secondary | ICD-10-CM | POA: Diagnosis not present

## 2014-05-30 DIAGNOSIS — I701 Atherosclerosis of renal artery: Secondary | ICD-10-CM | POA: Diagnosis present

## 2014-05-30 DIAGNOSIS — B9620 Unspecified Escherichia coli [E. coli] as the cause of diseases classified elsewhere: Secondary | ICD-10-CM | POA: Diagnosis not present

## 2014-05-30 DIAGNOSIS — N990 Postprocedural (acute) (chronic) kidney failure: Secondary | ICD-10-CM | POA: Diagnosis not present

## 2014-05-30 DIAGNOSIS — Y95 Nosocomial condition: Secondary | ICD-10-CM | POA: Diagnosis not present

## 2014-05-30 DIAGNOSIS — T814XXA Infection following a procedure, initial encounter: Secondary | ICD-10-CM | POA: Diagnosis not present

## 2014-05-30 DIAGNOSIS — D62 Acute posthemorrhagic anemia: Secondary | ICD-10-CM | POA: Diagnosis not present

## 2014-05-30 DIAGNOSIS — N2581 Secondary hyperparathyroidism of renal origin: Secondary | ICD-10-CM | POA: Diagnosis present

## 2014-05-30 DIAGNOSIS — G4733 Obstructive sleep apnea (adult) (pediatric): Secondary | ICD-10-CM | POA: Diagnosis present

## 2014-05-30 DIAGNOSIS — J189 Pneumonia, unspecified organism: Secondary | ICD-10-CM | POA: Diagnosis not present

## 2014-05-30 DIAGNOSIS — I5032 Chronic diastolic (congestive) heart failure: Secondary | ICD-10-CM | POA: Diagnosis present

## 2014-05-30 DIAGNOSIS — J9801 Acute bronchospasm: Secondary | ICD-10-CM | POA: Diagnosis not present

## 2014-05-30 DIAGNOSIS — J95821 Acute postprocedural respiratory failure: Secondary | ICD-10-CM | POA: Diagnosis not present

## 2014-05-30 DIAGNOSIS — J9690 Respiratory failure, unspecified, unspecified whether with hypoxia or hypercapnia: Secondary | ICD-10-CM | POA: Insufficient documentation

## 2014-05-30 DIAGNOSIS — Y838 Other surgical procedures as the cause of abnormal reaction of the patient, or of later complication, without mention of misadventure at the time of the procedure: Secondary | ICD-10-CM | POA: Diagnosis not present

## 2014-05-30 DIAGNOSIS — J9589 Other postprocedural complications and disorders of respiratory system, not elsewhere classified: Secondary | ICD-10-CM | POA: Diagnosis not present

## 2014-05-30 DIAGNOSIS — Z978 Presence of other specified devices: Secondary | ICD-10-CM

## 2014-05-30 DIAGNOSIS — R1313 Dysphagia, pharyngeal phase: Secondary | ICD-10-CM | POA: Diagnosis not present

## 2014-05-30 DIAGNOSIS — N508 Other specified disorders of male genital organs: Secondary | ICD-10-CM | POA: Diagnosis present

## 2014-05-30 DIAGNOSIS — E876 Hypokalemia: Secondary | ICD-10-CM | POA: Diagnosis not present

## 2014-05-30 DIAGNOSIS — I480 Paroxysmal atrial fibrillation: Secondary | ICD-10-CM | POA: Diagnosis not present

## 2014-05-30 DIAGNOSIS — I4892 Unspecified atrial flutter: Secondary | ICD-10-CM | POA: Diagnosis not present

## 2014-05-30 DIAGNOSIS — D696 Thrombocytopenia, unspecified: Secondary | ICD-10-CM | POA: Diagnosis present

## 2014-05-30 DIAGNOSIS — G9341 Metabolic encephalopathy: Secondary | ICD-10-CM | POA: Diagnosis not present

## 2014-05-30 DIAGNOSIS — E43 Unspecified severe protein-calorie malnutrition: Secondary | ICD-10-CM | POA: Diagnosis present

## 2014-05-30 DIAGNOSIS — I2510 Atherosclerotic heart disease of native coronary artery without angina pectoris: Secondary | ICD-10-CM | POA: Diagnosis present

## 2014-05-30 DIAGNOSIS — I481 Persistent atrial fibrillation: Secondary | ICD-10-CM | POA: Diagnosis not present

## 2014-05-30 DIAGNOSIS — I714 Abdominal aortic aneurysm, without rupture, unspecified: Secondary | ICD-10-CM | POA: Diagnosis present

## 2014-05-30 DIAGNOSIS — N170 Acute kidney failure with tubular necrosis: Secondary | ICD-10-CM | POA: Diagnosis not present

## 2014-05-30 DIAGNOSIS — E871 Hypo-osmolality and hyponatremia: Secondary | ICD-10-CM | POA: Diagnosis not present

## 2014-05-30 DIAGNOSIS — I4891 Unspecified atrial fibrillation: Secondary | ICD-10-CM

## 2014-05-30 DIAGNOSIS — E785 Hyperlipidemia, unspecified: Secondary | ICD-10-CM | POA: Diagnosis present

## 2014-05-30 DIAGNOSIS — R0603 Acute respiratory distress: Secondary | ICD-10-CM

## 2014-05-30 DIAGNOSIS — T829XXA Unspecified complication of cardiac and vascular prosthetic device, implant and graft, initial encounter: Secondary | ICD-10-CM

## 2014-05-30 DIAGNOSIS — J9601 Acute respiratory failure with hypoxia: Secondary | ICD-10-CM | POA: Diagnosis not present

## 2014-05-30 DIAGNOSIS — J9600 Acute respiratory failure, unspecified whether with hypoxia or hypercapnia: Secondary | ICD-10-CM | POA: Insufficient documentation

## 2014-05-30 DIAGNOSIS — Z7901 Long term (current) use of anticoagulants: Secondary | ICD-10-CM | POA: Diagnosis not present

## 2014-05-30 DIAGNOSIS — E874 Mixed disorder of acid-base balance: Secondary | ICD-10-CM | POA: Diagnosis not present

## 2014-05-30 DIAGNOSIS — E870 Hyperosmolality and hypernatremia: Secondary | ICD-10-CM | POA: Diagnosis not present

## 2014-05-30 DIAGNOSIS — N179 Acute kidney failure, unspecified: Secondary | ICD-10-CM | POA: Diagnosis not present

## 2014-05-30 DIAGNOSIS — R911 Solitary pulmonary nodule: Secondary | ICD-10-CM | POA: Diagnosis present

## 2014-05-30 DIAGNOSIS — T148XXA Other injury of unspecified body region, initial encounter: Secondary | ICD-10-CM

## 2014-05-30 DIAGNOSIS — N189 Chronic kidney disease, unspecified: Secondary | ICD-10-CM

## 2014-05-30 DIAGNOSIS — Z9889 Other specified postprocedural states: Secondary | ICD-10-CM | POA: Insufficient documentation

## 2014-05-30 DIAGNOSIS — Z4659 Encounter for fitting and adjustment of other gastrointestinal appliance and device: Secondary | ICD-10-CM

## 2014-05-30 HISTORY — PX: AORTIC/RENAL BYPASS: SHX6299

## 2014-05-30 HISTORY — PX: AORTA - BILATERAL FEMORAL ARTERY BYPASS GRAFT: SHX1175

## 2014-05-30 LAB — CBC
HCT: 37.5 % — ABNORMAL LOW (ref 39.0–52.0)
HCT: 37.6 % — ABNORMAL LOW (ref 39.0–52.0)
HCT: 37.4 % — ABNORMAL LOW (ref 39.0–52.0)
Hemoglobin: 12.8 g/dL — ABNORMAL LOW (ref 13.0–17.0)
Hemoglobin: 12.8 g/dL — ABNORMAL LOW (ref 13.0–17.0)
Hemoglobin: 13.1 g/dL (ref 13.0–17.0)
MCH: 31.2 pg (ref 26.0–34.0)
MCH: 31.8 pg (ref 26.0–34.0)
MCH: 32 pg (ref 26.0–34.0)
MCHC: 34.1 g/dL (ref 30.0–36.0)
MCHC: 34 g/dL (ref 30.0–36.0)
MCHC: 35 g/dL (ref 30.0–36.0)
MCV: 91.5 fL (ref 78.0–100.0)
MCV: 93.3 fL (ref 78.0–100.0)
MCV: 91.4 fL (ref 78.0–100.0)
Platelets: 107 10*3/uL — ABNORMAL LOW (ref 150–400)
Platelets: 97 10*3/uL — ABNORMAL LOW (ref 150–400)
Platelets: 97 10*3/uL — ABNORMAL LOW (ref 150–400)
RBC: 4.1 MIL/uL — ABNORMAL LOW (ref 4.22–5.81)
RBC: 4.03 MIL/uL — ABNORMAL LOW (ref 4.22–5.81)
RBC: 4.09 MIL/uL — ABNORMAL LOW (ref 4.22–5.81)
RDW: 14.3 % (ref 11.5–15.5)
RDW: 14.7 % (ref 11.5–15.5)
RDW: 14.6 % (ref 11.5–15.5)
WBC: 11 10*3/uL — ABNORMAL HIGH (ref 4.0–10.5)
WBC: 15 10*3/uL — ABNORMAL HIGH (ref 4.0–10.5)
WBC: 13.8 10*3/uL — ABNORMAL HIGH (ref 4.0–10.5)

## 2014-05-30 LAB — POCT I-STAT 3, ART BLOOD GAS (G3+)
Acid-base deficit: 3 mmol/L — ABNORMAL HIGH (ref 0.0–2.0)
Acid-base deficit: 4 mmol/L — ABNORMAL HIGH (ref 0.0–2.0)
Acid-base deficit: 4 mmol/L — ABNORMAL HIGH (ref 0.0–2.0)
Bicarbonate: 22.8 mEq/L (ref 20.0–24.0)
Bicarbonate: 24.5 mEq/L — ABNORMAL HIGH (ref 20.0–24.0)
Bicarbonate: 21.1 mEq/L (ref 20.0–24.0)
O2 Saturation: 88 %
O2 Saturation: 91 %
O2 Saturation: 99 %
Patient temperature: 35.5
Patient temperature: 36
Patient temperature: 37.7
TCO2: 24 mmol/L (ref 0–100)
TCO2: 26 mmol/L (ref 0–100)
TCO2: 22 mmol/L (ref 0–100)
pCO2 arterial: 41.6 mmHg (ref 35.0–45.0)
pCO2 arterial: 53.9 mmHg — ABNORMAL HIGH (ref 35.0–45.0)
pCO2 arterial: 38.3 mmHg (ref 35.0–45.0)
pH, Arterial: 7.26 — ABNORMAL LOW (ref 7.350–7.450)
pH, Arterial: 7.34 — ABNORMAL LOW (ref 7.350–7.450)
pH, Arterial: 7.351 (ref 7.350–7.450)
pO2, Arterial: 60 mmHg — ABNORMAL LOW (ref 80.0–100.0)
pO2, Arterial: 60 mmHg — ABNORMAL LOW (ref 80.0–100.0)
pO2, Arterial: 137 mmHg — ABNORMAL HIGH (ref 80.0–100.0)

## 2014-05-30 LAB — GLUCOSE, CAPILLARY
Glucose-Capillary: 90 mg/dL (ref 70–99)
Glucose-Capillary: 143 mg/dL — ABNORMAL HIGH (ref 70–99)

## 2014-05-30 LAB — BASIC METABOLIC PANEL
Anion gap: 8 (ref 5–15)
Anion gap: 8 (ref 5–15)
BUN: 23 mg/dL (ref 6–23)
BUN: 23 mg/dL (ref 6–23)
CO2: 23 mmol/L (ref 19–32)
CO2: 22 mmol/L (ref 19–32)
Calcium: 7.2 mg/dL — ABNORMAL LOW (ref 8.4–10.5)
Calcium: 7 mg/dL — ABNORMAL LOW (ref 8.4–10.5)
Chloride: 108 mEq/L (ref 96–112)
Chloride: 111 mEq/L (ref 96–112)
Creatinine, Ser: 2.4 mg/dL — ABNORMAL HIGH (ref 0.50–1.35)
Creatinine, Ser: 2.39 mg/dL — ABNORMAL HIGH (ref 0.50–1.35)
GFR calc Af Amer: 30 mL/min — ABNORMAL LOW (ref >90)
GFR calc Af Amer: 30 mL/min — ABNORMAL LOW (ref >90)
GFR calc non Af Amer: 26 mL/min — ABNORMAL LOW (ref >90)
GFR calc non Af Amer: 26 mL/min — ABNORMAL LOW (ref >90)
Glucose, Bld: 178 mg/dL — ABNORMAL HIGH (ref 70–99)
Glucose, Bld: 176 mg/dL — ABNORMAL HIGH (ref 70–99)
Potassium: 5.1 mmol/L (ref 3.5–5.1)
Potassium: 4.2 mmol/L (ref 3.5–5.1)
Sodium: 139 mmol/L (ref 135–145)
Sodium: 141 mmol/L (ref 135–145)

## 2014-05-30 LAB — CK: Total CK: 328 U/L — ABNORMAL HIGH (ref 7–232)

## 2014-05-30 LAB — PROTIME-INR
INR: 1.93 — ABNORMAL HIGH (ref 0.00–1.49)
INR: 1.78 — ABNORMAL HIGH (ref 0.00–1.49)
INR: 1.58 — ABNORMAL HIGH (ref 0.00–1.49)
INR: 1.42 (ref 0.00–1.49)
Prothrombin Time: 22.2 seconds — ABNORMAL HIGH (ref 11.6–15.2)
Prothrombin Time: 20.9 seconds — ABNORMAL HIGH (ref 11.6–15.2)
Prothrombin Time: 19 seconds — ABNORMAL HIGH (ref 11.6–15.2)
Prothrombin Time: 17.5 seconds — ABNORMAL HIGH (ref 11.6–15.2)

## 2014-05-30 LAB — HEMOGLOBIN AND HEMATOCRIT, BLOOD
HCT: 32.8 % — ABNORMAL LOW (ref 39.0–52.0)
Hemoglobin: 11.2 g/dL — ABNORMAL LOW (ref 13.0–17.0)

## 2014-05-30 LAB — APTT
aPTT: >200 seconds (ref 24–37)
aPTT: 39 seconds — ABNORMAL HIGH (ref 24–37)
aPTT: 39 seconds — ABNORMAL HIGH (ref 24–37)
aPTT: 36 seconds (ref 24–37)

## 2014-05-30 LAB — FIBRINOGEN
Fibrinogen: 130 mg/dL — ABNORMAL LOW (ref 204–475)
Fibrinogen: 136 mg/dL — ABNORMAL LOW (ref 204–475)
Fibrinogen: 175 mg/dL — ABNORMAL LOW (ref 204–475)

## 2014-05-30 LAB — LACTATE DEHYDROGENASE: LDH: 363 U/L — ABNORMAL HIGH (ref 94–250)

## 2014-05-30 LAB — MAGNESIUM: Magnesium: 1.5 mg/dL (ref 1.5–2.5)

## 2014-05-30 LAB — LACTIC ACID, PLASMA: Lactic Acid, Venous: 2.6 mmol/L — ABNORMAL HIGH (ref 0.5–2.2)

## 2014-05-30 SURGERY — CREATION, BYPASS, ARTERIAL, AORTA TO FEMORAL, BILATERAL, USING GRAFT
Anesthesia: General | Site: Abdomen

## 2014-05-30 MED ORDER — FENTANYL CITRATE 0.05 MG/ML IJ SOLN: 50.0000 ug | Freq: Once | INTRAMUSCULAR | Status: DC

## 2014-05-30 MED ORDER — MIDAZOLAM HCL 5 MG/5ML IJ SOLN
INTRAMUSCULAR | Status: DC | PRN
  Administered 2014-05-30 (×2): 1 mg via INTRAVENOUS
  Administered 2014-05-30: 2 mg via INTRAVENOUS

## 2014-05-30 MED ORDER — FENTANYL CITRATE 0.05 MG/ML IJ SOLN: 50.0000 ug | INTRAMUSCULAR | Status: DC | PRN

## 2014-05-30 MED ORDER — PHENYLEPHRINE HCL 10 MG/ML IJ SOLN
10.0000 mg | INTRAVENOUS | Status: DC | PRN
  Administered 2014-05-30 (×2): via INTRAVENOUS
  Administered 2014-05-30: 40 ug/min via INTRAVENOUS

## 2014-05-30 MED ORDER — METOPROLOL TARTRATE 1 MG/ML IV SOLN: 2.0000 mg | INTRAVENOUS | Status: DC | PRN

## 2014-05-30 MED ORDER — SODIUM BICARBONATE 8.4 % IV SOLN
INTRAVENOUS | Status: DC | PRN
  Administered 2014-05-30 (×2): 25 meq via INTRAVENOUS
  Administered 2014-05-30: 50 meq via INTRAVENOUS

## 2014-05-30 MED ORDER — SODIUM CHLORIDE 0.9 % IV SOLN
25.0000 ug/h | INTRAVENOUS | Status: DC
  Administered 2014-05-30: 100 ug/h via INTRAVENOUS
  Administered 2014-05-31 (×2): 300 ug/h via INTRAVENOUS
  Administered 2014-05-31: 400 ug/h via INTRAVENOUS
  Administered 2014-06-01: 200 ug/h via INTRAVENOUS
  Administered 2014-06-01: 300 ug/h via INTRAVENOUS
  Administered 2014-06-01 – 2014-06-03 (×7): 350 ug/h via INTRAVENOUS
  Administered 2014-06-04: 400 ug/h via INTRAVENOUS
  Administered 2014-06-04: 350 ug/h via INTRAVENOUS
  Administered 2014-06-04 – 2014-06-05 (×3): 400 ug/h via INTRAVENOUS
  Administered 2014-06-05: 100 ug/h via INTRAVENOUS
  Administered 2014-06-06: 400 ug/h via INTRAVENOUS
  Administered 2014-06-06: 200 ug/h via INTRAVENOUS
  Filled 2014-05-30 (×21): qty 50

## 2014-05-30 MED ORDER — ROCURONIUM BROMIDE 100 MG/10ML IV SOLN
INTRAVENOUS | Status: DC | PRN
  Administered 2014-05-30: 50 mg via INTRAVENOUS
  Administered 2014-05-30: 30 mg via INTRAVENOUS
  Administered 2014-05-30: 20 mg via INTRAVENOUS

## 2014-05-30 MED ORDER — SODIUM CHLORIDE 0.9 % IV SOLN
INTRAVENOUS | Status: DC
  Administered 2014-05-30: 19:00:00 via INTRAVENOUS

## 2014-05-30 MED ORDER — SUCCINYLCHOLINE CHLORIDE 20 MG/ML IJ SOLN
INTRAMUSCULAR | Status: AC
  Filled 2014-05-30: qty 1

## 2014-05-30 MED ORDER — SODIUM BICARBONATE 8.4 % IV SOLN
INTRAVENOUS | Status: AC
  Filled 2014-05-30: qty 100

## 2014-05-30 MED ORDER — SODIUM CHLORIDE 0.9 % IV SOLN: Freq: Once | INTRAVENOUS | Status: DC

## 2014-05-30 MED ORDER — SODIUM CHLORIDE 0.9 % IV SOLN
INTRAVENOUS | Status: DC | PRN
  Administered 2014-05-30 (×4): via INTRAVENOUS

## 2014-05-30 MED ORDER — GUAIFENESIN-DM 100-10 MG/5ML PO SYRP: 15.0000 mL | ORAL_SOLUTION | ORAL | Status: DC | PRN

## 2014-05-30 MED ORDER — FENTANYL CITRATE 0.05 MG/ML IJ SOLN
INTRAMUSCULAR | Status: AC
  Filled 2014-05-30: qty 5

## 2014-05-30 MED ORDER — ACETAMINOPHEN 325 MG PO TABS: 325.0000 mg | ORAL_TABLET | ORAL | Status: DC | PRN

## 2014-05-30 MED ORDER — VECURONIUM BROMIDE 10 MG IV SOLR
INTRAVENOUS | Status: AC
  Filled 2014-05-30: qty 10

## 2014-05-30 MED ORDER — KCL IN DEXTROSE-NACL 20-5-0.45 MEQ/L-%-% IV SOLN
INTRAVENOUS | Status: DC
  Filled 2014-05-30 (×2): qty 1000

## 2014-05-30 MED ORDER — DOPAMINE-DEXTROSE 3.2-5 MG/ML-% IV SOLN
INTRAVENOUS | Status: DC | PRN
  Administered 2014-05-30: 3 ug/kg/min via INTRAVENOUS

## 2014-05-30 MED ORDER — PHENYLEPHRINE 40 MCG/ML (10ML) SYRINGE FOR IV PUSH (FOR BLOOD PRESSURE SUPPORT)
PREFILLED_SYRINGE | INTRAVENOUS | Status: AC
  Filled 2014-05-30: qty 10

## 2014-05-30 MED ORDER — HEPARIN SODIUM (PORCINE) 1000 UNIT/ML IJ SOLN
INTRAMUSCULAR | Status: DC | PRN
  Administered 2014-05-30: 1000 [IU] via INTRAVENOUS
  Administered 2014-05-30: 2000 [IU] via INTRAVENOUS
  Administered 2014-05-30: 8000 [IU] via INTRAVENOUS

## 2014-05-30 MED ORDER — CETYLPYRIDINIUM CHLORIDE 0.05 % MT LIQD: 7.0000 mL | Freq: Four times a day (QID) | OROMUCOSAL | Status: DC

## 2014-05-30 MED ORDER — PROPOFOL 10 MG/ML IV BOLUS
INTRAVENOUS | Status: DC | PRN
  Administered 2014-05-30: 200 mg via INTRAVENOUS

## 2014-05-30 MED ORDER — LIDOCAINE HCL 4 % MT SOLN
OROMUCOSAL | Status: DC | PRN
  Administered 2014-05-30: 4 mL via TOPICAL

## 2014-05-30 MED ORDER — SODIUM CHLORIDE 0.9 % IV SOLN
2.0000 mg/h | INTRAVENOUS | Status: DC
  Administered 2014-05-30: 2 mg/h via INTRAVENOUS
  Administered 2014-06-01: 4 mg/h via INTRAVENOUS
  Administered 2014-06-01: 3 mg/h via INTRAVENOUS
  Administered 2014-06-01: 4 mg/h via INTRAVENOUS
  Administered 2014-06-02 – 2014-06-04 (×5): 5 mg/h via INTRAVENOUS
  Filled 2014-05-30 (×10): qty 10

## 2014-05-30 MED ORDER — OXYMETAZOLINE HCL 0.05 % NA SOLN
NASAL | Status: AC
  Filled 2014-05-30: qty 15

## 2014-05-30 MED ORDER — MIDAZOLAM HCL 2 MG/2ML IJ SOLN
INTRAMUSCULAR | Status: AC
  Filled 2014-05-30: qty 2

## 2014-05-30 MED ORDER — PANTOPRAZOLE SODIUM 40 MG PO TBEC: 40.0000 mg | DELAYED_RELEASE_TABLET | Freq: Every day | ORAL | Status: DC

## 2014-05-30 MED ORDER — BISACODYL 10 MG RE SUPP: 10.0000 mg | Freq: Every day | RECTAL | Status: DC | PRN

## 2014-05-30 MED ORDER — DEXMEDETOMIDINE HCL IN NACL 200 MCG/50ML IV SOLN
INTRAVENOUS | Status: DC | PRN
  Administered 2014-05-30: .4 ug/kg/h via INTRAVENOUS

## 2014-05-30 MED ORDER — DEXTROSE 5 % IV SOLN
1.5000 g | Freq: Two times a day (BID) | INTRAVENOUS | Status: AC
  Administered 2014-05-31 (×2): 1.5 g via INTRAVENOUS
  Filled 2014-05-30 (×2): qty 1.5

## 2014-05-30 MED ORDER — ACETAMINOPHEN 650 MG RE SUPP
325.0000 mg | RECTAL | Status: DC | PRN
  Administered 2014-06-05: 650 mg via RECTAL
  Filled 2014-05-30 (×2): qty 1

## 2014-05-30 MED ORDER — DEXMEDETOMIDINE HCL IN NACL 200 MCG/50ML IV SOLN: 0.4000 ug/kg/h | INTRAVENOUS | Status: DC

## 2014-05-30 MED ORDER — DOPAMINE-DEXTROSE 3.2-5 MG/ML-% IV SOLN
0.0000 ug/kg/min | INTRAVENOUS | Status: DC
  Administered 2014-05-30: 3 ug/kg/min via INTRAVENOUS
  Administered 2014-05-31: 6 ug/kg/min via INTRAVENOUS
  Administered 2014-06-01: 5 ug/kg/min via INTRAVENOUS
  Filled 2014-05-30 (×2): qty 250

## 2014-05-30 MED ORDER — LIDOCAINE HCL (CARDIAC) 20 MG/ML IV SOLN
INTRAVENOUS | Status: AC
  Filled 2014-05-30: qty 5

## 2014-05-30 MED ORDER — MAGNESIUM SULFATE 2 GM/50ML IV SOLN
2.0000 g | Freq: Every day | INTRAVENOUS | Status: AC | PRN
  Administered 2014-05-30: 2 g via INTRAVENOUS
  Filled 2014-05-30: qty 50

## 2014-05-30 MED ORDER — LABETALOL HCL 5 MG/ML IV SOLN: 10.0000 mg | INTRAVENOUS | Status: DC | PRN

## 2014-05-30 MED ORDER — FENTANYL BOLUS VIA INFUSION
25.0000 ug | INTRAVENOUS | Status: DC | PRN
  Filled 2014-05-30: qty 25

## 2014-05-30 MED ORDER — GLYCOPYRROLATE 0.2 MG/ML IJ SOLN
INTRAMUSCULAR | Status: AC
  Filled 2014-05-30: qty 2

## 2014-05-30 MED ORDER — MANNITOL 25 % IV SOLN
INTRAVENOUS | Status: DC | PRN
  Administered 2014-05-30 (×2): 12.5 g via INTRAVENOUS

## 2014-05-30 MED ORDER — SODIUM CHLORIDE 0.9 % IJ SOLN
INTRAMUSCULAR | Status: AC
  Filled 2014-05-30: qty 10

## 2014-05-30 MED ORDER — INSULIN ASPART 100 UNIT/ML ~~LOC~~ SOLN
0.0000 [IU] | SUBCUTANEOUS | Status: DC
  Administered 2014-05-30: 1 [IU] via SUBCUTANEOUS
  Administered 2014-05-31: 2 [IU] via SUBCUTANEOUS
  Administered 2014-05-31: 1 [IU] via SUBCUTANEOUS
  Administered 2014-05-31 – 2014-06-01 (×2): 2 [IU] via SUBCUTANEOUS
  Administered 2014-06-09 – 2014-06-14 (×7): 1 [IU] via SUBCUTANEOUS
  Administered 2014-06-15: 2 [IU] via SUBCUTANEOUS
  Administered 2014-06-15 – 2014-06-16 (×4): 1 [IU] via SUBCUTANEOUS
  Administered 2014-06-16: 2 [IU] via SUBCUTANEOUS
  Administered 2014-06-16 – 2014-06-17 (×2): 1 [IU] via SUBCUTANEOUS
  Administered 2014-06-17: 2 [IU] via SUBCUTANEOUS
  Administered 2014-06-18 – 2014-06-19 (×2): 1 [IU] via SUBCUTANEOUS

## 2014-05-30 MED ORDER — CARVEDILOL 6.25 MG PO TABS
6.2500 mg | ORAL_TABLET | Freq: Two times a day (BID) | ORAL | Status: DC
  Administered 2014-05-30: 6.25 mg via ORAL
  Filled 2014-05-30 (×2): qty 1

## 2014-05-30 MED ORDER — HEMOSTATIC AGENTS (NO CHARGE) OPTIME
TOPICAL | Status: DC | PRN
  Administered 2014-05-30 (×3): 1 via TOPICAL

## 2014-05-30 MED ORDER — PHENOL 1.4 % MT LIQD: 1.0000 | OROMUCOSAL | Status: DC | PRN

## 2014-05-30 MED ORDER — CALCIUM CHLORIDE 10 % IV SOLN
INTRAVENOUS | Status: DC | PRN
  Administered 2014-05-30 (×2): 200 mg via INTRAVENOUS

## 2014-05-30 MED ORDER — OXYMETAZOLINE HCL 0.05 % NA SOLN
NASAL | Status: DC | PRN
  Administered 2014-05-30: 2 via NASAL

## 2014-05-30 MED ORDER — DOCUSATE SODIUM 100 MG PO CAPS: 100.0000 mg | ORAL_CAPSULE | Freq: Every day | ORAL | Status: DC

## 2014-05-30 MED ORDER — SODIUM CHLORIDE 0.9 % IV SOLN
25.0000 ug/h | INTRAVENOUS | Status: DC
  Filled 2014-05-30: qty 50

## 2014-05-30 MED ORDER — NEOSTIGMINE METHYLSULFATE 10 MG/10ML IV SOLN
INTRAVENOUS | Status: AC
  Filled 2014-05-30: qty 2

## 2014-05-30 MED ORDER — CARVEDILOL 3.125 MG PO TABS
ORAL_TABLET | ORAL | Status: AC
  Filled 2014-05-30: qty 2

## 2014-05-30 MED ORDER — MIDAZOLAM HCL 2 MG/2ML IJ SOLN
INTRAMUSCULAR | Status: AC
  Administered 2014-05-30: 2 mg
  Filled 2014-05-30: qty 2

## 2014-05-30 MED ORDER — PROPOFOL 10 MG/ML IV BOLUS
INTRAVENOUS | Status: AC
  Filled 2014-05-30: qty 20

## 2014-05-30 MED ORDER — CHLORHEXIDINE GLUCONATE 0.12 % MT SOLN
15.0000 mL | Freq: Two times a day (BID) | OROMUCOSAL | Status: DC
Start: 2014-05-30 — End: 2014-06-08
  Administered 2014-05-30 – 2014-06-07 (×17): 15 mL via OROMUCOSAL
  Filled 2014-05-30 (×17): qty 15

## 2014-05-30 MED ORDER — CHLORHEXIDINE GLUCONATE 0.12 % MT SOLN: 15.0000 mL | Freq: Two times a day (BID) | OROMUCOSAL | Status: DC

## 2014-05-30 MED ORDER — LACTATED RINGERS IV SOLN
INTRAVENOUS | Status: DC | PRN
  Administered 2014-05-30 (×2): via INTRAVENOUS

## 2014-05-30 MED ORDER — FENTANYL BOLUS VIA INFUSION
25.0000 ug | INTRAVENOUS | Status: DC | PRN
  Administered 2014-06-04 – 2014-06-06 (×3): 25 ug via INTRAVENOUS
  Filled 2014-05-30: qty 25

## 2014-05-30 MED ORDER — ARTIFICIAL TEARS OP OINT
TOPICAL_OINTMENT | OPHTHALMIC | Status: DC | PRN
  Administered 2014-05-30: 1 via OPHTHALMIC

## 2014-05-30 MED ORDER — HYDRALAZINE HCL 20 MG/ML IJ SOLN: 5.0000 mg | INTRAMUSCULAR | Status: DC | PRN

## 2014-05-30 MED ORDER — CETYLPYRIDINIUM CHLORIDE 0.05 % MT LIQD
7.0000 mL | Freq: Four times a day (QID) | OROMUCOSAL | Status: DC
  Administered 2014-05-31 – 2014-06-08 (×33): 7 mL via OROMUCOSAL

## 2014-05-30 MED ORDER — MAGNESIUM SULFATE 50 % IJ SOLN
2.0000 g | Freq: Once | INTRAVENOUS | Status: AC
  Filled 2014-05-30: qty 4

## 2014-05-30 MED ORDER — IPRATROPIUM-ALBUTEROL 0.5-2.5 (3) MG/3ML IN SOLN: 3.0000 mL | RESPIRATORY_TRACT | Status: DC

## 2014-05-30 MED ORDER — LIDOCAINE HCL (CARDIAC) 20 MG/ML IV SOLN
INTRAVENOUS | Status: DC | PRN
  Administered 2014-05-30: 80 mg via INTRAVENOUS

## 2014-05-30 MED ORDER — 0.9 % SODIUM CHLORIDE (POUR BTL) OPTIME
TOPICAL | Status: DC | PRN
  Administered 2014-05-30: 3000 mL

## 2014-05-30 MED ORDER — ROCURONIUM BROMIDE 50 MG/5ML IV SOLN
INTRAVENOUS | Status: AC
  Filled 2014-05-30: qty 1

## 2014-05-30 MED ORDER — ALUM & MAG HYDROXIDE-SIMETH 200-200-20 MG/5ML PO SUSP: 15.0000 mL | ORAL | Status: DC | PRN

## 2014-05-30 MED ORDER — EPHEDRINE SULFATE 50 MG/ML IJ SOLN
INTRAMUSCULAR | Status: DC | PRN
  Administered 2014-05-30: 5 mg via INTRAVENOUS

## 2014-05-30 MED ORDER — IPRATROPIUM-ALBUTEROL 0.5-2.5 (3) MG/3ML IN SOLN
3.0000 mL | Freq: Four times a day (QID) | RESPIRATORY_TRACT | Status: DC
  Administered 2014-05-31 (×2): 3 mL via RESPIRATORY_TRACT
  Filled 2014-05-30 (×2): qty 3

## 2014-05-30 MED ORDER — EPHEDRINE SULFATE 50 MG/ML IJ SOLN
INTRAMUSCULAR | Status: AC
  Filled 2014-05-30: qty 1

## 2014-05-30 MED ORDER — SODIUM CHLORIDE 0.9 % IR SOLN
Status: DC | PRN
  Administered 2014-05-30: 09:00:00

## 2014-05-30 MED ORDER — VECURONIUM BROMIDE 10 MG IV SOLR
INTRAVENOUS | Status: DC | PRN
  Administered 2014-05-30 (×4): 2 mg via INTRAVENOUS
  Administered 2014-05-30: 5 mg via INTRAVENOUS
  Administered 2014-05-30 (×6): 2 mg via INTRAVENOUS

## 2014-05-30 MED ORDER — MORPHINE SULFATE 2 MG/ML IJ SOLN: 2.0000 mg | INTRAMUSCULAR | Status: DC | PRN

## 2014-05-30 MED ORDER — GLYCOPYRROLATE 0.2 MG/ML IJ SOLN
INTRAMUSCULAR | Status: DC | PRN
  Administered 2014-05-30: 0.4 mg via INTRAVENOUS

## 2014-05-30 MED ORDER — FENTANYL CITRATE 0.05 MG/ML IJ SOLN
INTRAMUSCULAR | Status: DC | PRN
  Administered 2014-05-30 (×2): 50 ug via INTRAVENOUS
  Administered 2014-05-30: 100 ug via INTRAVENOUS
  Administered 2014-05-30 (×5): 50 ug via INTRAVENOUS
  Administered 2014-05-30 (×2): 100 ug via INTRAVENOUS
  Administered 2014-05-30 (×2): 50 ug via INTRAVENOUS

## 2014-05-30 MED ORDER — PANTOPRAZOLE SODIUM 40 MG IV SOLR
40.0000 mg | INTRAVENOUS | Status: DC
  Administered 2014-05-30 – 2014-06-10 (×12): 40 mg via INTRAVENOUS
  Filled 2014-05-30 (×16): qty 40

## 2014-05-30 MED ORDER — POTASSIUM CHLORIDE CRYS ER 20 MEQ PO TBCR: 20.0000 meq | EXTENDED_RELEASE_TABLET | Freq: Every day | ORAL | Status: DC | PRN

## 2014-05-30 MED ORDER — DEXTROSE 5 % IV SOLN
1.5000 g | INTRAVENOUS | Status: DC
  Filled 2014-05-30: qty 1.5

## 2014-05-30 MED ORDER — SODIUM CHLORIDE 0.9 % IV SOLN: 500.0000 mL | Freq: Once | INTRAVENOUS | Status: AC | PRN

## 2014-05-30 MED ORDER — DEXMEDETOMIDINE HCL IN NACL 200 MCG/50ML IV SOLN
0.0000 ug/kg/h | INTRAVENOUS | Status: DC
  Administered 2014-05-30: 0.7 ug/kg/h via INTRAVENOUS

## 2014-05-30 MED ORDER — ONDANSETRON HCL 4 MG/2ML IJ SOLN: 4.0000 mg | Freq: Four times a day (QID) | INTRAMUSCULAR | Status: DC | PRN

## 2014-05-30 MED ORDER — DEXTROSE 5 % IV SOLN
0.0000 ug/min | INTRAVENOUS | Status: DC
  Administered 2014-05-30 – 2014-05-31 (×2): 50 ug/min via INTRAVENOUS
  Filled 2014-05-30 (×2): qty 4

## 2014-05-30 MED ORDER — ALBUMIN HUMAN 5 % IV SOLN
INTRAVENOUS | Status: DC | PRN
  Administered 2014-05-30 (×6): via INTRAVENOUS

## 2014-05-30 MED ORDER — ALBUTEROL SULFATE (2.5 MG/3ML) 0.083% IN NEBU
2.5000 mg | INHALATION_SOLUTION | RESPIRATORY_TRACT | Status: DC | PRN
  Administered 2014-05-30 – 2014-06-19 (×3): 2.5 mg via RESPIRATORY_TRACT
  Filled 2014-05-30 (×5): qty 3

## 2014-05-30 MED ORDER — SODIUM CHLORIDE 0.9 % IV SOLN
INTRAVENOUS | Status: DC
  Administered 2014-05-30 – 2014-06-03 (×4): via INTRAVENOUS
  Administered 2014-06-05 – 2014-06-08 (×2): 10 mL/h via INTRAVENOUS
  Administered 2014-06-18: 1 mL via INTRAVENOUS
  Administered 2014-06-20: 09:00:00 via INTRAVENOUS

## 2014-05-30 MED ORDER — PROTAMINE SULFATE 10 MG/ML IV SOLN
INTRAVENOUS | Status: DC | PRN
  Administered 2014-05-30: 50 mg via INTRAVENOUS

## 2014-05-30 SURGICAL SUPPLY — 82 items
CANISTER SUCTION 2500CC (MISCELLANEOUS) ×4 IMPLANT
CATH EMB 4FR 80CM (CATHETERS) ×4 IMPLANT
CATH EMB 5FR 80CM (CATHETERS) ×4 IMPLANT
CLIP TI MEDIUM 24 (CLIP) ×4 IMPLANT
CLIP TI WIDE RED SMALL 24 (CLIP) ×4 IMPLANT
COVER MAYO STAND STRL (DRAPES) ×4 IMPLANT
COVER SURGICAL LIGHT HANDLE (MISCELLANEOUS) ×4 IMPLANT
ELECT BLADE 4.0 EZ CLEAN MEGAD (MISCELLANEOUS) ×4
ELECT BLADE 6.5 EXT (BLADE) IMPLANT
ELECT CAUTERY BLADE 6.4 (BLADE) ×2 IMPLANT
ELECT REM PT RETURN 9FT ADLT (ELECTROSURGICAL) ×8
ELECTRODE BLDE 4.0 EZ CLN MEGD (MISCELLANEOUS) ×2 IMPLANT
ELECTRODE REM PT RTRN 9FT ADLT (ELECTROSURGICAL) ×2 IMPLANT
FELT TEFLON 4 X1 (Mesh General) ×2 IMPLANT
GLOVE BIO SURGEON STRL SZ 6.5 (GLOVE) ×2 IMPLANT
GLOVE BIO SURGEON STRL SZ7.5 (GLOVE) ×4 IMPLANT
GLOVE BIO SURGEONS STRL SZ 6.5 (GLOVE) ×2
GLOVE BIOGEL PI IND STRL 6.5 (GLOVE) IMPLANT
GLOVE BIOGEL PI IND STRL 7.0 (GLOVE) IMPLANT
GLOVE BIOGEL PI IND STRL 7.5 (GLOVE) ×2 IMPLANT
GLOVE BIOGEL PI IND STRL 8 (GLOVE) IMPLANT
GLOVE BIOGEL PI INDICATOR 6.5 (GLOVE) ×4
GLOVE BIOGEL PI INDICATOR 7.0 (GLOVE) ×8
GLOVE BIOGEL PI INDICATOR 7.5 (GLOVE) ×8
GLOVE BIOGEL PI INDICATOR 8 (GLOVE) ×4
GLOVE ECLIPSE 6.5 STRL STRAW (GLOVE) ×8 IMPLANT
GLOVE ECLIPSE 7.0 STRL STRAW (GLOVE) ×2 IMPLANT
GLOVE SS BIOGEL STRL SZ 7 (GLOVE) IMPLANT
GLOVE SUPERSENSE BIOGEL SZ 7 (GLOVE) ×4
GLOVE SURG SS PI 7.5 STRL IVOR (GLOVE) ×6 IMPLANT
GOWN STRL REUS W/ TWL LRG LVL3 (GOWN DISPOSABLE) ×4 IMPLANT
GOWN STRL REUS W/ TWL XL LVL3 (GOWN DISPOSABLE) IMPLANT
GOWN STRL REUS W/TWL 2XL LVL3 (GOWN DISPOSABLE) ×2 IMPLANT
GOWN STRL REUS W/TWL LRG LVL3 (GOWN DISPOSABLE) ×36
GOWN STRL REUS W/TWL XL LVL3 (GOWN DISPOSABLE) ×12
GRAFT CV 30X6KNTD STRG TUBE (Vascular Products) IMPLANT
GRAFT HEMASHIELD 16X8MM (Vascular Products) ×2 IMPLANT
GRAFT HEMASHIELD 20M ST (Vascular Products) ×2 IMPLANT
GRAFT HEMASHIELD 6MM (Vascular Products) ×4 IMPLANT
HEMOSTAT SNOW SURGICEL 2X4 (HEMOSTASIS) ×6 IMPLANT
INSERT FOGARTY 61MM (MISCELLANEOUS) ×4 IMPLANT
INSERT FOGARTY SM (MISCELLANEOUS) ×8 IMPLANT
KIT BASIN OR (CUSTOM PROCEDURE TRAY) ×4 IMPLANT
KIT ROOM TURNOVER OR (KITS) ×4 IMPLANT
LIQUID BAND (GAUZE/BANDAGES/DRESSINGS) ×10 IMPLANT
NS IRRIG 1000ML POUR BTL (IV SOLUTION) ×10 IMPLANT
PACK AORTA (CUSTOM PROCEDURE TRAY) ×4 IMPLANT
PAD ARMBOARD 7.5X6 YLW CONV (MISCELLANEOUS) ×8 IMPLANT
PENCIL BUTTON HOLSTER BLD 10FT (ELECTRODE) ×2 IMPLANT
PROBE PENCIL 8 MHZ STRL DISP (MISCELLANEOUS) ×2 IMPLANT
PUNCH AORTIC ROTATE 5MM 8IN (MISCELLANEOUS) ×2 IMPLANT
SPONGE GAUZE 4X4 12PLY STER LF (GAUZE/BANDAGES/DRESSINGS) ×2 IMPLANT
SPONGE LAP 18X18 X RAY DECT (DISPOSABLE) ×8 IMPLANT
STOPCOCK 4 WAY LG BORE MALE ST (IV SETS) ×4 IMPLANT
SUT ETHIBOND 5 LR DA (SUTURE) ×6 IMPLANT
SUT PDS AB 1 TP1 54 (SUTURE) ×8 IMPLANT
SUT PROLENE 3 0 SH 48 (SUTURE) ×16 IMPLANT
SUT PROLENE 3 0 SH DA (SUTURE) ×6 IMPLANT
SUT PROLENE 4 0 RB 1 (SUTURE) ×8
SUT PROLENE 4-0 RB1 .5 CRCL 36 (SUTURE) IMPLANT
SUT PROLENE 5 0 C 1 24 (SUTURE) ×12 IMPLANT
SUT PROLENE 5 0 C 1 36 (SUTURE) IMPLANT
SUT PROLENE 6 0 BV (SUTURE) ×4 IMPLANT
SUT PROLENE 6 0 C 1 30 (SUTURE) ×6 IMPLANT
SUT SILK 2 0 (SUTURE) ×4
SUT SILK 2 0 TIES 17X18 (SUTURE) ×4
SUT SILK 2 0SH CR/8 30 (SUTURE) ×6 IMPLANT
SUT SILK 2-0 18XBRD TIE 12 (SUTURE) ×2 IMPLANT
SUT SILK 2-0 18XBRD TIE BLK (SUTURE) ×2 IMPLANT
SUT SILK 3 0 (SUTURE) ×4
SUT SILK 3 0 TIES 17X18 (SUTURE) ×4
SUT SILK 3-0 18XBRD TIE 12 (SUTURE) ×2 IMPLANT
SUT SILK 3-0 18XBRD TIE BLK (SUTURE) ×2 IMPLANT
SUT VIC AB 2-0 CT1 27 (SUTURE) ×24
SUT VIC AB 2-0 CT1 TAPERPNT 27 (SUTURE) ×12 IMPLANT
SUT VIC AB 3-0 SH 27 (SUTURE) ×16
SUT VIC AB 3-0 SH 27X BRD (SUTURE) ×8 IMPLANT
SUT VICRYL 4-0 PS2 18IN ABS (SUTURE) ×16 IMPLANT
SYR 3ML LL SCALE MARK (SYRINGE) ×4 IMPLANT
TOWEL BLUE STERILE X RAY DET (MISCELLANEOUS) ×8 IMPLANT
TRAY FOLEY CATH 16FRSI W/METER (SET/KITS/TRAYS/PACK) ×4 IMPLANT
WATER STERILE IRR 1000ML POUR (IV SOLUTION) ×8 IMPLANT

## 2014-05-30 NOTE — Anesthesia Procedure Notes (Signed)
Procedure Name: Intubation Date/Time: 05/30/2014 7:45 AM Performed by: Maeola Harman Pre-anesthesia Checklist: Patient identified, Emergency Drugs available, Suction available, Patient being monitored and Timeout performed Patient Re-evaluated:Patient Re-evaluated prior to inductionOxygen Delivery Method: Circle system utilized Preoxygenation: Pre-oxygenation with 100% oxygen Intubation Type: IV induction Ventilation: Mask ventilation without difficulty and Oral airway inserted - appropriate to patient size Laryngoscope Size: Mac and 3 Grade View: Grade I Tube type: Oral Tube size: 7.5 mm Number of attempts: 1 Airway Equipment and Method: Stylet,  Oral airway and Bite block Placement Confirmation: ETT inserted through vocal cords under direct vision,  positive ETCO2 and breath sounds checked- equal and bilateral Secured at: 23 cm Tube secured with: Tape Dental Injury: Teeth and Oropharynx as per pre-operative assessment

## 2014-05-30 NOTE — OR Nursing (Signed)
Second Call to SICU @ 1620.

## 2014-05-30 NOTE — Anesthesia Preprocedure Evaluation (Addendum)
Anesthesia Evaluation  Patient identified by MRN, date of birth, ID band Patient awake    Reviewed: Allergy & Precautions, NPO status , Patient's Chart, lab work & pertinent test results  Airway Mallampati: II  TM Distance: >3 FB Neck ROM: Full    Dental  (+) Teeth Intact, Dental Advisory Given   Pulmonary sleep apnea and Continuous Positive Airway Pressure Ventilation , pneumonia -, resolved, COPD COPD inhaler, Current Smoker,  breath sounds clear to auscultation        Cardiovascular hypertension, Pt. on medications + CAD, + Past MI, + CABG and + Peripheral Vascular Disease Rhythm:Regular     Neuro/Psych  Headaches,    GI/Hepatic GERD-  ,  Endo/Other  diabetes, Well Controlled, Type 2  Renal/GU Renal InsufficiencyRenal disease     Musculoskeletal  (+) Arthritis -,   Abdominal (+)  Abdomen: soft. Bowel sounds: normal.  Peds  Hematology   Anesthesia Other Findings   Reproductive/Obstetrics                        Anesthesia Physical Anesthesia Plan  ASA: III  Anesthesia Plan: General   Post-op Pain Management:    Induction: Intravenous  Airway Management Planned: Oral ETT  Additional Equipment: Arterial line, PA Cath and CVP  Intra-op Plan:   Post-operative Plan: Possible Post-op intubation/ventilation  Informed Consent: I have reviewed the patients History and Physical, chart, labs and discussed the procedure including the risks, benefits and alternatives for the proposed anesthesia with the patient or authorized representative who has indicated his/her understanding and acceptance.     Plan Discussed with: CRNA, Anesthesiologist and Surgeon  Anesthesia Plan Comments:         Anesthesia Quick Evaluation

## 2014-05-30 NOTE — OR Nursing (Signed)
First call to 2300 SICU @1537 .Volunteer called & family updated @ 1537.

## 2014-05-30 NOTE — Anesthesia Postprocedure Evaluation (Signed)
Anesthesia Post Note  Patient: Barry Lawrence  Procedure(s) Performed: Procedure(s) (LRB): AORTOBIFEMORAL BYPASS GRAFT (N/A) LEFT RENAL ARTERY BYPASS (Left)  Anesthesia type: General  Patient location: ICU  Post pain: Pain level controlled  Post assessment: Post-op Vital signs reviewed  Last Vitals:  Filed Vitals:   05/30/14 0614  BP: 168/82  Pulse: 70  Temp: 36.4 C  Resp: 20    Post vital signs: stable  Level of consciousness: Patient remains intubated per anesthesia plan  Complications: No apparent anesthesia complications

## 2014-05-30 NOTE — H&P (Signed)
Expand All Collapse All       Patient name: Barry Lawrence [facility]: [id number] DOB: 07-14-1947Sex: male    Chief Complaint  Patient presents with  . Re-evaluation    3 wk f/u     HISTORY OF PRESENT ILLNESS: The patient is back today for further discussions regarding his abdominal aortic aneurysm with maximum diameter of 5.6 cm. He recently underwent angiography by Dr. Alvester Chou which revealed a high-grade left renal artery stenosis. The patient has a history of iliac stenting. Based on his imaging, I do not feel he has adequate anatomy for an endovascular procedure. The patient also underwent pulmonary evaluation by Dr. Melvyn Novas. He is cleared for surgery. He does continue to smoke.   The patient has a history of coronary artery disease. He is status post CABG in 1996. He had a Myoview approximately 1 year ago at the [facility]. He is also status post right carotid endarterectomy by Dr. Amedeo Plenty in 2009.. The patient has bilateral claudication. He has undergone iliac stenting bilaterally. He still complains of trouble walking.  The patient suffers from hypercholesterolemia which is managed with a statin. He is on multiple medications for hypertension. He continues to smoke approximately 2 packs a day. His shortness of breath has been fairly stable Past Medical History  Diagnosis Date  . CAD (coronary artery disease)     myoview 04/21/11-normal, EF49%  . S/P CABG (coronary artery bypass graft) 1996  . Hypertension   . Hyperlipemia   . Carotid stenosis 01/15/08    right endarterectomy-Dr. Amedeo Plenty  . PAD (peripheral artery disease) 06/12/09    a. 11/22/07 PTA & stenting right external iliac artery;bilateral iliac & PTI and stenting 1997;right ICA =>50% reduction,right SFA >50%,left ICA at stent => 50% reduction,left EIA => 50% reduction,left ATA occluded, left SFA mid > 49% reduction; 03/2014 Angio: LRA 90, patent L Iliac  stent and distal RCIA stent, large saccular AAA.  [name] Abdominal aortic aneurysm 01/28/10    4.3x4.6cm  . Polycythemia     H/O  . GERD (gastroesophageal reflux disease)   . Tobacco abuse   . OSA on CPAP   . CKD (chronic kidney disease), stage III     Past Surgical History  Procedure Laterality Date  . Coronary artery bypass graft  1996    LIMA to LAD, sequential vein to OM1 and 2 as well as acure marginal branch and diag branch  . Carotid endarterectomy  01/15/08  . Lumbar disc surgery  1998  . Pv angio  11/22/2007    PTA/ stenting of right common iliac artery with a 10x25mm Smart stent and postdilated with a 7x66mmpowerflex balloon; high grade calcified stenosis of the RICA  . Colonoscopy N/A 07/09/2013    Procedure: COLONOSCOPY; Surgeon: Juanita Craver, MD; Location: [facility]; Service: Endoscopy; Laterality: N/A;    History   Social History  . Marital Status: Married    Spouse Name: N/A    Number of Children: 2  . Years of Education: N/A   Occupational History  . Retired Estate manager/land agent    Social History Main Topics  . Smoking status: Current Every Day Smoker -- 2.00 packs/day for 56 years    Types: Cigarettes  . Smokeless tobacco: Never Used  . Alcohol Use: No  . Drug Use: No  . Sexual Activity: Not on file   Other Topics Concern  . Not on file   Social History Narrative   Lives with wife.  Family History  Problem Relation Age of Onset  . Heart failure Father     Allergies as of 04/08/2014 - Review Complete 04/08/2014  Allergen Reaction Noted  . Niaspan [niacin er] Hives 07/15/2012    Current Outpatient Prescriptions on File Prior to Visit  Medication Sig Dispense Refill  . amLODipine (NORVASC) 10 MG tablet Take 10 mg by mouth daily.    . Aspirin-Acetaminophen-Caffeine (GOODY HEADACHE PO) Take 1 packet  by mouth every hour as needed (for pain).     [name] atorvastatin (LIPITOR) 80 MG tablet Take 80 mg by mouth at bedtime.     . carvedilol (COREG) 6.25 MG tablet Take 6.25 mg by mouth 2 (two) times daily with a meal.    . Cholecalciferol (VITAMIN D3) 5000 UNITS CAPS Take 5,000 Units by mouth daily.    [name] ezetimibe (ZETIA) 10 MG tablet Take 10 mg by mouth at bedtime.     . fenofibrate 160 MG tablet Take 160 mg by mouth at bedtime.     [name] losartan-hydrochlorothiazide (HYZAAR) 100-25 MG per tablet Take 1 tablet by mouth daily.    [name] omeprazole (PRILOSEC) 20 MG capsule Take 20 mg by mouth at bedtime.     . Saw Palmetto 450 MG CAPS Take 900 mg by mouth 2 (two) times daily.    . Saw Palmetto, Serenoa repens, (SAW PALMETTO PO) Take 2 tablets by mouth 2 (two) times daily.    . tamsulosin (FLOMAX) 0.4 MG CAPS capsule     . VIAGRA 100 MG tablet      No current facility-administered medications on file prior to visit.     REVIEW OF SYSTEMS: No changes from previous visit   PHYSICAL EXAMINATION:  Vital signs are BP 160/82 mmHg  Pulse 62  Ht 5\' 7"  (1.702 m)  Wt 197 lb (89.359 kg)  BMI 30.85 kg/m2  SpO2 98% General: The patient appears their stated age. HEENT: No gross abnormalities Pulmonary: Non labored breathing Abdomen: Soft and non-tender Musculoskeletal: There are no major deformities. Neurologic: No focal weakness or paresthesias are detected, Skin: There are no ulcer or rashes noted. Psychiatric: The patient has normal affect. Cardiovascular: There is a regular rate and rhythm without significant murmur appreciated.   Diagnostic Studies None  Assessment: Abdominal aortic aneurysm Left renal artery stenosis Lung nodule Plan: I discussed with the patient that I feel his best option for repair is an aortobifemoral bypass graft. I do not feel his anatomy is suitable for endovascular repair. I discussed the risks and  benefits of the procedure including the risk of death, cardioplegic complications, intestinal and lower extremity ischemia. The patient would like to get this done in January. He understands the risk of rupture. I would also plan on a left renal artery bypass. He does have a history of renal insufficiency. He is currently being evaluated by nephrology. Angiography revealed a high-grade left renal artery stenosis.   The patient has a nodule on CT scan which I will order a follow-up in 6 months. I discussed this with the patient today.  Eldridge Abrahams, M.D. [facility] Office: [phone] Pager: [phone]

## 2014-05-30 NOTE — OR Nursing (Signed)
Volunteer called and family given update @ 1222 per Dr. [name].

## 2014-05-30 NOTE — Transfer of Care (Signed)
Immediate Anesthesia Transfer of Care Note  Patient: Barry Lawrence  Procedure(s) Performed: Procedure(s): AORTOBIFEMORAL BYPASS GRAFT (N/A) LEFT RENAL ARTERY BYPASS (Left)  Patient Location: SICU  Anesthesia Type:General  Level of Consciousness: sedated  Airway & Oxygen Therapy: Patient remains intubated per anesthesia plan and Patient placed on Ventilator (see vital sign flow sheet for setting)  Post-op Assessment: Report given to PACU RN and Post -op Vital signs reviewed and stable  Post vital signs: Reviewed and stable  Complications: No apparent anesthesia complications

## 2014-05-30 NOTE — Consult Note (Signed)
PULMONARY / CRITICAL CARE MEDICINE   Name: Barry Lawrence MRN: [id number] DOB: [birth year]    ADMISSION DATE:  05/30/2014 CONSULTATION DATE:  1/7  REFERRING MD :  [name]   CHIEF COMPLAINT:  Vent management   INITIAL PRESENTATION: 69 yo male active smoker with extensive PMH including hx CAD s/p CABG, CKD III, OSA on CPAP, DM, HTN, COPD, stable L apex 75m nodule (followed by wert) presented 1/7 for elective repair of 5.6cm AAA.  Remains intubated post op and PCCM consulted for vent/medical management.   STUDIES:  None  SIGNIFICANT EVENTS: 1/7 OR>> elective repair AAA   HISTORY OF PRESENT ILLNESS:  69yo male active smoker with extensive PMH including hx CAD s/p CABG, CKD III, OSA on CPAP, DM, HTN, COPD, stable L apex 631mnodule (followed by wert) presented 1/7 for elective repair of 5.6cm AAA.  Remains intubated post op and PCCM consulted for vent/medical management.  Approx 2 liters blood loss, 2 ffp, 1 unit prbc plus cell saver. Appeared mottled post op lower ext and abdo lower. NO hypoxia intra op. Slow oozing in case. To ICU, acidotic, shock, low output.   PAST MEDICAL HISTORY :   has a past medical history of CAD (coronary artery disease); S/P CABG (coronary artery bypass graft) (1996); Hyperlipemia; Polycythemia; GERD (gastroesophageal reflux disease); Tobacco abuse; CKD (chronic kidney disease), stage III; Myocardial infarction (1996); OSA on CPAP; Pneumonia; Diabetes; Headache; Arthritis; Hypertension; Carotid stenosis (01/15/08); PAD (peripheral artery disease) (06/12/09); and Abdominal aortic aneurysm (01/28/10; 02/05/14).  has past surgical history that includes Carotid endarterectomy (Right, 01/15/08); Lumbar disc surgery (1998); pv angio (11/22/2007); Colonoscopy (N/A, 07/09/2013); PV angio (04/01/14); abdominal aortagram (N/A, 04/01/2014); Back surgery (1988); and Coronary artery bypass graft (1996). Prior to Admission medications   Medication Sig Start Date End Date Taking? Authorizing  Provider  amLODipine (NORVASC) 10 MG tablet Take 10 mg by mouth daily before breakfast.    Yes Historical Provider, MD  Aspirin-Acetaminophen-Caffeine (GOODY HEADACHE PO) Take 1 packet by mouth every hour as needed (for pain).    Yes Historical Provider, MD  atorvastatin (LIPITOR) 80 MG tablet Take 80 mg by mouth at bedtime.    Yes Historical Provider, MD  carvedilol (COREG) 6.25 MG tablet Take 6.25 mg by mouth 2 (two) times daily with a meal.   Yes Historical Provider, MD  Cholecalciferol (VITAMIN D3) 5000 UNITS CAPS Take 5,000 Units by mouth daily.   Yes Historical Provider, MD  ezetimibe (ZETIA) 10 MG tablet Take 10 mg by mouth at bedtime.    Yes Historical Provider, MD  fenofibrate 160 MG tablet Take 160 mg by mouth at bedtime.    Yes Historical Provider, MD  losartan-hydrochlorothiazide (HYZAAR) 100-25 MG per tablet Take 1 tablet by mouth daily before breakfast.    Yes Historical Provider, MD  omeprazole (PRILOSEC) 20 MG capsule Take 20 mg by mouth at bedtime.    Yes Historical Provider, MD   Allergies  Allergen Reactions  . Niaspan [Niacin Er] Hives    FAMILY HISTORY:  indicated that his mother is deceased. He indicated that his father is deceased.  SOCIAL HISTORY:  reports that he has been smoking Cigarettes.  He has a 56 pack-year smoking history. He has never used smokeless tobacco. He reports that he does not drink alcohol or use illicit drugs.  REVIEW OF SYSTEMS:  Unable, pt sedated on vent post op.   SUBJECTIVE: shock, post op residual aneasthesia  VITAL SIGNS: Temp:  [97.5 F (36.4 C)] 97.5 F (  36.4 C) (01/07 3354) Pulse Rate:  [70] 70 (01/07 0614) Resp:  [20] 20 (01/07 0614) BP: (168)/(82) 168/82 mmHg (01/07 0614) SpO2:  [96 %] 96 % (01/07 0614) Weight:  [197 lb 9 oz (89.614 kg)] 197 lb 9 oz (89.614 kg) (01/07 5625) HEMODYNAMICS:   VENTILATOR SETTINGS:   INTAKE / OUTPUT:  Intake/Output Summary (Last 24 hours) at 05/30/14 1631 Last data filed at 05/30/14 1624   Gross per 24 hour  Intake  10360 ml  Output   6575 ml  Net   3785 ml    PHYSICAL EXAMINATION: General: unresponsive, per 52m Neuro:  Some fasciculations , moving upper ext, rass deep -5 HEENT:  [place], no jvd Cardiovascular:  s1 s2 RRR distant Lungs:  CTA Abdomen:  Wound clean, soft, distended, obese, no r./g Musculoskeletal:  Diffuse mottled legs and lower abdo from T10 down Skin:  As above, livido  LABS:  CBC  Recent Labs Lab 05/30/14 1300 05/30/14 1610  WBC 11.0* 15.0*  HGB 12.8* 12.8*  HCT 37.5* 37.6*  PLT 107* 97*   Coag's  Recent Labs Lab 05/30/14 1300  APTT >200*  INR 1.93*   BMET No results for input(s): NA, K, CL, CO2, BUN, CREATININE, GLUCOSE in the last 168 hours. Electrolytes No results for input(s): CALCIUM, MG, PHOS in the last 168 hours. Sepsis Markers No results for input(s): LATICACIDVEN, PROCALCITON, O2SATVEN in the last 168 hours. ABG No results for input(s): PHART, PCO2ART, PO2ART in the last 168 hours. Liver Enzymes No results for input(s): AST, ALT, ALKPHOS, BILITOT, ALBUMIN in the last 168 hours. Cardiac Enzymes No results for input(s): TROPONINI, PROBNP in the last 168 hours. Glucose  Recent Labs Lab 05/30/14 0600  GLUCAP 90    Imaging No results found.   ASSESSMENT / PLAN:  PULMONARY OETT 01/07 >>> A: Acute respiratory failure following surgery for AAA bypass COPD OSA on CPAP Stable left apical 610mnodule (followed by Dr. WeMelvyn NovasAcute resp acidosis, uncompensated met acidosis P:   Full vent support, wean as able. VAP bundle. DuoNebs / Albuterol. CXR stat assess ett likely can tolerate fluids fine Increase rate STAT , repeat abg Keep plat less 30   CARDIOVASCULAR L radial a line 01/07 >>> R SwLuiz Blare1/07 >>> A:  Shock - likely due to hypovolemia given reported significant blood loss in OR (? Maybe 2 L; however, most replaced using cell saver) AAA s/p bypass 01/07 ([facility]) Hx CAD with MI s/p CABG 1996 Hx HLD,  HTN, PAD, carotid stenosis s/p right CEA R/o ischemia P:  Dopamine and Neosynephrine PRN. Goal MAP > 65, max 58m5m for beta affect Neo added, if to 200 add levo Cortisol, lactate. Post op care per vascular surgery. Monitor hemodynamics. Hold outpatient amlodipine, atorvastatin, carvedilol, ezetimibe, fenofibrate, hyzaar. Cbc now and frequent Trop ecg no changes I will obtain pcxr then swan to wedge and CI  RENAL A:   CKD III Mild hyponatremia Acute renal failure, s/o clamp ATN hypovolemia P:   NS @ 200/hr for now. BMP q4h until output improved and acidosis corrected Increase MV and correct ph  GASTROINTESTINAL A:   GERD Nutrition R/o liver shock At risk bowel ishemia, reported to be WNL at end of case P:   Pantoprazole. NPO. TF if remains NPO > 24 hours. Lactic frequent abdo exam Lft, ldh  HEMATOLOGIC A:  Concern for acute blood loss anemia given blood loss from OR Hx polycythemia VTE prophylaxis coagulapathy consumptive P:  STAT CBC, Coags. H/H q4hrs. FFP x  2 STAT SCD's if oK by vasc CBC in AM. INR goal less 1.5 Assess fibrinogen  INFECTIOUS A:   Surgical prophylaxis P:   Abx: Cerfuroxime x 2 doses.  ENDOCRINE A:    R/o rel AI P:   CBG's. SSI q4hrs. cortisol  NEUROLOGIC A:   Acute metabolic encephalopathy P:   Sedation:  fent RASS goal: -3 Daily WUA if able in am    FAMILY  - Updates: wife bedside 1/7  - Inter-disciplinary family meet or Palliative Care meeting due by:  1/24   05/30/2014, 4:31 PM   STAFF NOTE: I, Merrie Roof, MD FACP have personally reviewed patient's available data, including medical history, events of note, physical examination and test results as part of my evaluation. I have discussed with resident/NP and other care providers such as pharmacist, RN and RRT. In addition, I personally evaluated patient and elicited key findings [id number] bedside management with vasc, INcrease MV, add neo to dop,  volume, volume , volume, cortisol, repeat abg, send bmet, cbc, trop, wife updated, mottling concerning for hypoperfusion but exam otherwise wnl, freuent pulse checks, cpk, pos balance, ffp, repeat cbc, coags The patient is critically ill with multiple organ systems failure and requires high complexity decision making for assessment and support, frequent evaluation and titration of therapies, application of advanced monitoring technologies and extensive interpretation of multiple databases.   Critical Care Time devoted to patient care services described in this note is90 Minutes. This time reflects time of care of this signee: Merrie Roof, MD FACP. This critical care time does not reflect procedure time, or teaching time or supervisory time of PA/NP/Med student/Med Resident etc but could involve care discussion time. Rest per NP/medical resident whose note is outlined above and that I agree with   Lavon Paganini. Titus Mould, MD, [place] Pgr: [facility] 05/30/2014 7:46 PM

## 2014-05-31 ENCOUNTER — Inpatient Hospital Stay (HOSPITAL_COMMUNITY): Payer: Medicare Other

## 2014-05-31 DIAGNOSIS — R571 Hypovolemic shock: Secondary | ICD-10-CM

## 2014-05-31 LAB — COMPREHENSIVE METABOLIC PANEL
ALT: 45 U/L (ref 0–53)
AST: 139 U/L — ABNORMAL HIGH (ref 0–37)
Albumin: 2.8 g/dL — ABNORMAL LOW (ref 3.5–5.2)
Alkaline Phosphatase: 26 U/L — ABNORMAL LOW (ref 39–117)
Anion gap: 7 (ref 5–15)
BUN: 27 mg/dL — ABNORMAL HIGH (ref 6–23)
CO2: 21 mmol/L (ref 19–32)
Calcium: 6.8 mg/dL — ABNORMAL LOW (ref 8.4–10.5)
Chloride: 110 mEq/L (ref 96–112)
Creatinine, Ser: 2.95 mg/dL — ABNORMAL HIGH (ref 0.50–1.35)
GFR calc Af Amer: 24 mL/min — ABNORMAL LOW (ref >90)
GFR calc non Af Amer: 20 mL/min — ABNORMAL LOW (ref >90)
Glucose, Bld: 138 mg/dL — ABNORMAL HIGH (ref 70–99)
Potassium: 4 mmol/L (ref 3.5–5.1)
Sodium: 138 mmol/L (ref 135–145)
Total Bilirubin: 1 mg/dL (ref 0.3–1.2)
Total Protein: 4.2 g/dL — ABNORMAL LOW (ref 6.0–8.3)

## 2014-05-31 LAB — POCT I-STAT 7, (LYTES, BLD GAS, ICA,H+H)
Acid-Base Excess: 1 mmol/L (ref 0.0–2.0)
Acid-base deficit: 3 mmol/L — ABNORMAL HIGH (ref 0.0–2.0)
Acid-base deficit: 5 mmol/L — ABNORMAL HIGH (ref 0.0–2.0)
Acid-base deficit: 5 mmol/L — ABNORMAL HIGH (ref 0.0–2.0)
Acid-base deficit: 7 mmol/L — ABNORMAL HIGH (ref 0.0–2.0)
Acid-base deficit: 7 mmol/L — ABNORMAL HIGH (ref 0.0–2.0)
Bicarbonate: 27.6 mEq/L — ABNORMAL HIGH (ref 20.0–24.0)
Bicarbonate: 26 mEq/L — ABNORMAL HIGH (ref 20.0–24.0)
Bicarbonate: 23.7 mEq/L (ref 20.0–24.0)
Bicarbonate: 22.9 mEq/L (ref 20.0–24.0)
Bicarbonate: 23 mEq/L (ref 20.0–24.0)
Bicarbonate: 22.5 mEq/L (ref 20.0–24.0)
Bicarbonate: 21.7 mEq/L (ref 20.0–24.0)
Calcium, Ion: 1.18 mmol/L (ref 1.13–1.30)
Calcium, Ion: 1.08 mmol/L — ABNORMAL LOW (ref 1.13–1.30)
Calcium, Ion: 1.02 mmol/L — ABNORMAL LOW (ref 1.13–1.30)
Calcium, Ion: 1.03 mmol/L — ABNORMAL LOW (ref 1.13–1.30)
Calcium, Ion: 1.3 mmol/L (ref 1.13–1.30)
Calcium, Ion: 1.1 mmol/L — ABNORMAL LOW (ref 1.13–1.30)
Calcium, Ion: 1.09 mmol/L — ABNORMAL LOW (ref 1.13–1.30)
HCT: 51 % (ref 39.0–52.0)
HCT: 46 % (ref 39.0–52.0)
HCT: 37 % — ABNORMAL LOW (ref 39.0–52.0)
HCT: 41 % (ref 39.0–52.0)
HCT: 36 % — ABNORMAL LOW (ref 39.0–52.0)
HCT: 38 % — ABNORMAL LOW (ref 39.0–52.0)
HCT: 39 % (ref 39.0–52.0)
Hemoglobin: 17.3 g/dL — ABNORMAL HIGH (ref 13.0–17.0)
Hemoglobin: 15.6 g/dL (ref 13.0–17.0)
Hemoglobin: 12.6 g/dL — ABNORMAL LOW (ref 13.0–17.0)
Hemoglobin: 13.9 g/dL (ref 13.0–17.0)
Hemoglobin: 12.2 g/dL — ABNORMAL LOW (ref 13.0–17.0)
Hemoglobin: 12.9 g/dL — ABNORMAL LOW (ref 13.0–17.0)
Hemoglobin: 13.3 g/dL (ref 13.0–17.0)
O2 Saturation: 99 %
O2 Saturation: 100 %
O2 Saturation: 100 %
O2 Saturation: 100 %
O2 Saturation: 99 %
O2 Saturation: 100 %
O2 Saturation: 100 %
Potassium: 3.9 mmol/L (ref 3.5–5.1)
Potassium: 4 mmol/L (ref 3.5–5.1)
Potassium: 4.5 mmol/L (ref 3.5–5.1)
Potassium: 4.8 mmol/L (ref 3.5–5.1)
Potassium: 4.9 mmol/L (ref 3.5–5.1)
Potassium: 5.4 mmol/L — ABNORMAL HIGH (ref 3.5–5.1)
Potassium: 5.4 mmol/L — ABNORMAL HIGH (ref 3.5–5.1)
Sodium: 136 mmol/L (ref 135–145)
Sodium: 137 mmol/L (ref 135–145)
Sodium: 138 mmol/L (ref 135–145)
Sodium: 136 mmol/L (ref 135–145)
Sodium: 138 mmol/L (ref 135–145)
Sodium: 139 mmol/L (ref 135–145)
Sodium: 139 mmol/L (ref 135–145)
TCO2: 29 mmol/L (ref 0–100)
TCO2: 27 mmol/L (ref 0–100)
TCO2: 25 mmol/L (ref 0–100)
TCO2: 24 mmol/L (ref 0–100)
TCO2: 25 mmol/L (ref 0–100)
TCO2: 24 mmol/L (ref 0–100)
TCO2: 23 mmol/L (ref 0–100)
pCO2 arterial: 51.3 mmHg — ABNORMAL HIGH (ref 35.0–45.0)
pCO2 arterial: 46.4 mmHg — ABNORMAL HIGH (ref 35.0–45.0)
pCO2 arterial: 49.3 mmHg — ABNORMAL HIGH (ref 35.0–45.0)
pCO2 arterial: 51.2 mmHg — ABNORMAL HIGH (ref 35.0–45.0)
pCO2 arterial: 53.3 mmHg — ABNORMAL HIGH (ref 35.0–45.0)
pCO2 arterial: 65.7 mmHg (ref 35.0–45.0)
pCO2 arterial: 57.1 mmHg (ref 35.0–45.0)
pH, Arterial: 7.338 — ABNORMAL LOW (ref 7.350–7.450)
pH, Arterial: 7.357 (ref 7.350–7.450)
pH, Arterial: 7.291 — ABNORMAL LOW (ref 7.350–7.450)
pH, Arterial: 7.259 — ABNORMAL LOW (ref 7.350–7.450)
pH, Arterial: 7.243 — ABNORMAL LOW (ref 7.350–7.450)
pH, Arterial: 7.143 — CL (ref 7.350–7.450)
pH, Arterial: 7.188 — CL (ref 7.350–7.450)
pO2, Arterial: 155 mmHg — ABNORMAL HIGH (ref 80.0–100.0)
pO2, Arterial: 230 mmHg — ABNORMAL HIGH (ref 80.0–100.0)
pO2, Arterial: 221 mmHg — ABNORMAL HIGH (ref 80.0–100.0)
pO2, Arterial: 227 mmHg — ABNORMAL HIGH (ref 80.0–100.0)
pO2, Arterial: 168 mmHg — ABNORMAL HIGH (ref 80.0–100.0)
pO2, Arterial: 381 mmHg — ABNORMAL HIGH (ref 80.0–100.0)
pO2, Arterial: 359 mmHg — ABNORMAL HIGH (ref 80.0–100.0)

## 2014-05-31 LAB — PREPARE FRESH FROZEN PLASMA
Unit division: 0
Unit division: 0
Unit division: 0
Unit division: 0
Unit division: 0

## 2014-05-31 LAB — BLOOD GAS, ARTERIAL
Acid-base deficit: 4 mmol/L — ABNORMAL HIGH (ref 0.0–2.0)
Bicarbonate: 19.8 mEq/L — ABNORMAL LOW (ref 20.0–24.0)
Drawn by: 252031
FIO2: 0.4 %
MECHVT: 530 mL
O2 Saturation: 96.3 %
PEEP: 5 cmH2O
Patient temperature: 98.6
RATE: 26 resp/min
TCO2: 20.8 mmol/L (ref 0–100)
pCO2 arterial: 32 mmHg — ABNORMAL LOW (ref 35.0–45.0)
pH, Arterial: 7.41 (ref 7.350–7.450)
pO2, Arterial: 86.7 mmHg (ref 80.0–100.0)

## 2014-05-31 LAB — GLUCOSE, CAPILLARY
Glucose-Capillary: 103 mg/dL — ABNORMAL HIGH (ref 70–99)
Glucose-Capillary: 109 mg/dL — ABNORMAL HIGH (ref 70–99)
Glucose-Capillary: 110 mg/dL — ABNORMAL HIGH (ref 70–99)
Glucose-Capillary: 114 mg/dL — ABNORMAL HIGH (ref 70–99)
Glucose-Capillary: 121 mg/dL — ABNORMAL HIGH (ref 70–99)
Glucose-Capillary: 124 mg/dL — ABNORMAL HIGH (ref 70–99)
Glucose-Capillary: 130 mg/dL — ABNORMAL HIGH (ref 70–99)
Glucose-Capillary: 139 mg/dL — ABNORMAL HIGH (ref 70–99)

## 2014-05-31 LAB — POCT I-STAT 3, ART BLOOD GAS (G3+)
Acid-base deficit: 6 mmol/L — ABNORMAL HIGH (ref 0.0–2.0)
Acid-base deficit: 6 mmol/L — ABNORMAL HIGH (ref 0.0–2.0)
Acid-base deficit: 6 mmol/L — ABNORMAL HIGH (ref 0.0–2.0)
Bicarbonate: 21 mEq/L (ref 20.0–24.0)
Bicarbonate: 21.4 mEq/L (ref 20.0–24.0)
Bicarbonate: 20.9 mEq/L (ref 20.0–24.0)
O2 Saturation: 90 %
O2 Saturation: 88 %
O2 Saturation: 88 %
Patient temperature: 37.3
Patient temperature: 37.3
Patient temperature: 37.3
TCO2: 22 mmol/L (ref 0–100)
TCO2: 23 mmol/L (ref 0–100)
TCO2: 22 mmol/L (ref 0–100)
pCO2 arterial: 47.9 mmHg — ABNORMAL HIGH (ref 35.0–45.0)
pCO2 arterial: 50.3 mmHg — ABNORMAL HIGH (ref 35.0–45.0)
pCO2 arterial: 48.8 mmHg — ABNORMAL HIGH (ref 35.0–45.0)
pH, Arterial: 7.251 — ABNORMAL LOW (ref 7.350–7.450)
pH, Arterial: 7.237 — ABNORMAL LOW (ref 7.350–7.450)
pH, Arterial: 7.241 — ABNORMAL LOW (ref 7.350–7.450)
pO2, Arterial: 70 mmHg — ABNORMAL LOW (ref 80.0–100.0)
pO2, Arterial: 67 mmHg — ABNORMAL LOW (ref 80.0–100.0)
pO2, Arterial: 64 mmHg — ABNORMAL LOW (ref 80.0–100.0)

## 2014-05-31 LAB — CBC
HCT: 27.7 % — ABNORMAL LOW (ref 39.0–52.0)
HCT: 27 % — ABNORMAL LOW (ref 39.0–52.0)
Hemoglobin: 9.6 g/dL — ABNORMAL LOW (ref 13.0–17.0)
Hemoglobin: 9 g/dL — ABNORMAL LOW (ref 13.0–17.0)
MCH: 31.2 pg (ref 26.0–34.0)
MCH: 30.1 pg (ref 26.0–34.0)
MCHC: 34.7 g/dL (ref 30.0–36.0)
MCHC: 33.3 g/dL (ref 30.0–36.0)
MCV: 89.9 fL (ref 78.0–100.0)
MCV: 90.3 fL (ref 78.0–100.0)
Platelets: 72 10*3/uL — ABNORMAL LOW (ref 150–400)
Platelets: 83 10*3/uL — ABNORMAL LOW (ref 150–400)
RBC: 3.08 MIL/uL — ABNORMAL LOW (ref 4.22–5.81)
RBC: 2.99 MIL/uL — ABNORMAL LOW (ref 4.22–5.81)
RDW: 14.7 % (ref 11.5–15.5)
RDW: 15.3 % (ref 11.5–15.5)
WBC: 10.8 10*3/uL — ABNORMAL HIGH (ref 4.0–10.5)
WBC: 15.6 10*3/uL — ABNORMAL HIGH (ref 4.0–10.5)

## 2014-05-31 LAB — BASIC METABOLIC PANEL
Anion gap: 11 (ref 5–15)
Anion gap: 7 (ref 5–15)
Anion gap: 9 (ref 5–15)
BUN: 25 mg/dL — ABNORMAL HIGH (ref 6–23)
BUN: 29 mg/dL — ABNORMAL HIGH (ref 6–23)
BUN: 33 mg/dL — ABNORMAL HIGH (ref 6–23)
CO2: 19 mmol/L (ref 19–32)
CO2: 20 mmol/L (ref 19–32)
CO2: 20 mmol/L (ref 19–32)
Calcium: 6.6 mg/dL — ABNORMAL LOW (ref 8.4–10.5)
Calcium: 6.7 mg/dL — ABNORMAL LOW (ref 8.4–10.5)
Calcium: 6.9 mg/dL — ABNORMAL LOW (ref 8.4–10.5)
Chloride: 109 mEq/L (ref 96–112)
Chloride: 112 mEq/L (ref 96–112)
Chloride: 113 mEq/L — ABNORMAL HIGH (ref 96–112)
Creatinine, Ser: 2.66 mg/dL — ABNORMAL HIGH (ref 0.50–1.35)
Creatinine, Ser: 3.14 mg/dL — ABNORMAL HIGH (ref 0.50–1.35)
Creatinine, Ser: 3.65 mg/dL — ABNORMAL HIGH (ref 0.50–1.35)
GFR calc Af Amer: 18 mL/min — ABNORMAL LOW (ref >90)
GFR calc Af Amer: 22 mL/min — ABNORMAL LOW (ref >90)
GFR calc Af Amer: 27 mL/min — ABNORMAL LOW (ref >90)
GFR calc non Af Amer: 16 mL/min — ABNORMAL LOW (ref >90)
GFR calc non Af Amer: 19 mL/min — ABNORMAL LOW (ref >90)
GFR calc non Af Amer: 23 mL/min — ABNORMAL LOW (ref >90)
Glucose, Bld: 132 mg/dL — ABNORMAL HIGH (ref 70–99)
Glucose, Bld: 138 mg/dL — ABNORMAL HIGH (ref 70–99)
Glucose, Bld: 146 mg/dL — ABNORMAL HIGH (ref 70–99)
Potassium: 3.9 mmol/L (ref 3.5–5.1)
Potassium: 4 mmol/L (ref 3.5–5.1)
Potassium: 4.6 mmol/L (ref 3.5–5.1)
Sodium: 140 mmol/L (ref 135–145)
Sodium: 140 mmol/L (ref 135–145)
Sodium: 140 mmol/L (ref 135–145)

## 2014-05-31 LAB — PROTIME-INR
INR: 1.46 (ref 0.00–1.49)
Prothrombin Time: 17.9 seconds — ABNORMAL HIGH (ref 11.6–15.2)

## 2014-05-31 LAB — HEMOGLOBIN AND HEMATOCRIT, BLOOD
HCT: 28 % — ABNORMAL LOW (ref 39.0–52.0)
HCT: 27 % — ABNORMAL LOW (ref 39.0–52.0)
Hemoglobin: 9.7 g/dL — ABNORMAL LOW (ref 13.0–17.0)
Hemoglobin: 9.3 g/dL — ABNORMAL LOW (ref 13.0–17.0)

## 2014-05-31 LAB — CORTISOL: Cortisol, Plasma: 72.6 ug/dL

## 2014-05-31 LAB — APTT: aPTT: 39 seconds — ABNORMAL HIGH (ref 24–37)

## 2014-05-31 LAB — AMYLASE: Amylase: 59 U/L (ref 0–105)

## 2014-05-31 LAB — POCT I-STAT 4, (NA,K, GLUC, HGB,HCT)
Glucose, Bld: 121 mg/dL — ABNORMAL HIGH (ref 70–99)
HCT: 42 % (ref 39.0–52.0)
Hemoglobin: 14.3 g/dL (ref 13.0–17.0)
Potassium: 4 mmol/L (ref 3.5–5.1)
Sodium: 140 mmol/L (ref 135–145)

## 2014-05-31 LAB — MAGNESIUM: Magnesium: 1.8 mg/dL (ref 1.5–2.5)

## 2014-05-31 MED ORDER — SODIUM CHLORIDE 0.9 % IV BOLUS (SEPSIS)
1000.0000 mL | Freq: Once | INTRAVENOUS | Status: AC
  Administered 2014-05-31: 1000 mL via INTRAVENOUS

## 2014-05-31 MED FILL — Sodium Chloride Irrigation Soln 0.9%: Qty: 3000 | Status: AC

## 2014-05-31 MED FILL — Sodium Chloride IV Soln 0.9%: INTRAVENOUS | Qty: 3000 | Status: AC

## 2014-05-31 MED FILL — Heparin Sodium (Porcine) Inj 1000 Unit/ML: INTRAMUSCULAR | Qty: 60 | Status: AC

## 2014-05-31 NOTE — Brief Op Note (Signed)
05/30/2014  10:27 AM  PATIENT:  Corlis Hove  69 y.o. male  PRE-OPERATIVE DIAGNOSIS:  Abdominal Aortic Aneurysm I71.4; Left Renal Artery Stenosis I70.1  POST-OPERATIVE DIAGNOSIS:  Abdominal Aortic Aneurysm I71.4; Left Renal Artery Stenosis I70.1  PROCEDURE:  Procedure(s): AORTOBIFEMORAL BYPASS GRAFT (N/A) LEFT RENAL ARTERY BYPASS (Left)  SURGEON:  Surgeon(s) and Role:    * Serafina Mitchell, MD - Primary    * Angelia Mould, MD - Assisting  PHYSICIAN ASSISTANT:   ASSISTANTS: Gae Gallop, M. Delphia Grates   ANESTHESIA:   general  EBL:  Total I/O In: 45 [I.V.:579] Out: 34 [Urine:60]  BLOOD ADMINISTERED:see record for CC CELLSAVER  DRAINS: none   LOCAL MEDICATIONS USED:  NONE  SPECIMEN:  No Specimen  DISPOSITION OF SPECIMEN:  N/A  COUNTS:  YES  TOURNIQUET:  * No tourniquets in log *  DICTATION: .Dragon Dictation  PLAN OF CARE: Admit to inpatient   PATIENT DISPOSITION:  ICU - intubated and hemodynamically stable.   Delay start of Pharmacological VTE agent (>24hrs) due to surgical blood loss or risk of bleeding: yes

## 2014-05-31 NOTE — Progress Notes (Signed)
INITIAL NUTRITION ASSESSMENT  DOCUMENTATION CODES Per approved criteria  -Obesity Unspecified   INTERVENTION: If pt remains intubated > 48 hours, recommend initiating Vital High Protein @ 20 ml/hr via NGT and increase by 10 ml every 4 hours to goal rate of 50 ml/hr.   30 ml Prostat BID.    Tube feeding regimen provides 1400 kcal (69% of needs), 135 grams of protein, and 1008 ml of H2O.    NUTRITION DIAGNOSIS: Inadequate oral intake related to inability to eat as evidenced by NPO/Vent status.   Goal: Enteral nutrition to provide 60-70% of estimated calorie needs (22-25 kcals/kg ideal body weight) and 100% of estimated protein needs, based on ASPEN guidelines for hypocaloric, high protein feeding in critically ill obese individuals   Monitor:  Vent status, TF initiation/tolerance, weight trend, labs  Reason for Assessment: Vent  69 y.o. male  Admitting Dx: <principal problem not specified>  ASSESSMENT: 69 yo male active smoker with extensive PMH including hx CAD s/p CABG, CKD III, OSA on CPAP, DM, HTN, COPD, stable L apex 42mm nodule (followed by wert) presented 1/7 for elective repair of 5.6cm AAA. Remains intubated post op.   Pt with NGT in place to suction; about 800 ml volume output at time of visit. Pt appears well-nourished and weight appears stable per weight history.  Patient is currently intubated on ventilator support MV: 9.3 L/min Temp (24hrs), Avg:98.7 F (37.1 C), Min:95.9 F (35.5 C), Max:100.8 F (38.2 C)  Labs: low hemoglobin, low calcium, low GFR  Height: Ht Readings from Last 1 Encounters:  05/30/14 5\' 7"  (1.702 m)    Weight: Wt Readings from Last 1 Encounters:  05/30/14 197 lb 9 oz (89.614 kg)    Ideal Body Weight: 148 lbs  % Ideal Body Weight: 133%  Wt Readings from Last 10 Encounters:  05/30/14 197 lb 9 oz (89.614 kg)  05/22/14 197 lb 9.6 oz (89.631 kg)  05/02/14 198 lb (89.812 kg)  04/15/14 198 lb 6.4 oz (89.994 kg)  04/08/14 197 lb  (89.359 kg)  04/01/14 192 lb 0.3 oz (87.1 kg)  03/26/14 201 lb (91.173 kg)  03/18/14 197 lb 8 oz (89.585 kg)  02/19/14 196 lb (88.905 kg)  07/09/13 190 lb (86.183 kg)    Usual Body Weight: 197 lbs  % Usual Body Weight: 100%  BMI:  Body mass index is 30.94 kg/(m^2). (Obese)  Estimated Nutritional Needs: Kcal: 2020 (Underfeeding goal: [id number]) Protein: >/=135 grams Fluid: 2.5 L/day  Skin: closed abdominal incision, closed right and left groin incisions  Diet Order: Diet NPO time specified  EDUCATION NEEDS: -No education needs identified at this time   Intake/Output Summary (Last 24 hours) at 05/31/14 0900 Last data filed at 05/31/14 0800  Gross per 24 hour  Intake 18446.34 ml  Output   7410 ml  Net 11036.34 ml    Last BM: PTA  Labs:   Recent Labs Lab 05/30/14 1733  05/31/14 0001 05/31/14 0430 05/31/14 0800  NA 139  < > 140 138 140  K 5.1  < > 3.9 4.0 4.0  CL 108  < > 113* 110 112  CO2 23  < > 20 21 19   BUN 23  < > 25* 27* 29*  CREATININE 2.40*  < > 2.66* 2.95* 3.14*  CALCIUM 7.2*  < > 6.9* 6.8* 6.7*  MG 1.5  --   --  1.8  --   GLUCOSE 178*  < > 146* 138* 138*  < > = values in this interval  not displayed.  CBG (last 3)   Recent Labs  05/31/14 0345 05/31/14 0432 05/31/14 0821  GLUCAP 124* 130* 114*    Scheduled Meds: . sodium chloride   Intravenous Once  . sodium chloride   Intravenous Once  . antiseptic oral rinse  7 mL Mouth Rinse QID  . cefUROXime (ZINACEF)  IV  1.5 g Intravenous Q12H  . chlorhexidine  15 mL Mouth Rinse BID  . fentaNYL  50 mcg Intravenous Once  . insulin aspart  0-9 Units Subcutaneous 6 times per day  . ipratropium-albuterol  3 mL Nebulization Q6H  . pantoprazole (PROTONIX) IV  40 mg Intravenous Q24H    Continuous Infusions: . sodium chloride 200 mL/hr at 05/31/14 0800  . sodium chloride Stopped (05/31/14 0800)  . DOPamine 5.952 mcg/kg/min (05/31/14 0800)  . fentaNYL infusion INTRAVENOUS 300 mcg/hr (05/31/14 0845)   . midazolam (VERSED) infusion 2 mg/hr (05/31/14 0800)  . phenylephrine (NEO-SYNEPHRINE) Adult infusion 20 mcg/min (05/31/14 0800)    Past Medical History  Diagnosis Date  . CAD (coronary artery disease)     myoview 04/21/11-normal, EF49%  . S/P CABG (coronary artery bypass graft) 1996  . Hyperlipemia   . Polycythemia     H/O  . GERD (gastroesophageal reflux disease)   . Tobacco abuse   . CKD (chronic kidney disease), stage III   . Myocardial infarction 1996  . OSA on CPAP     CPAP q night , CPAP is through [place], states doesn't remember when he had the last study  . Pneumonia     hosp. as a child with pneumonia, not since   . Diabetes     BORDERLINE  . Headache     uses [employer], states he is lessening what he is taking for prep. for surgery   . Arthritis     in his back  . Hypertension     stress test- done 04/2014, followed by Dr. Gwenlyn Found  . Carotid stenosis 01/15/08    right endarterectomy-Dr. Amedeo Plenty  . PAD (peripheral artery disease) 06/12/09    a. 11/22/07 PTA & stenting right external iliac artery;bilateral iliac & PTI and stenting 1997;right ICA =>50% reduction,right SFA >50%,left ICA at stent => 50% reduction,left EIA => 50% reduction,left ATA occluded, left SFA mid > 49% reduction;  03/2014 Angio: LRA 90, patent L Iliac stent and distal RCIA stent, large saccular AAA.  [name] Abdominal aortic aneurysm 01/28/10; 02/05/14    4.3x4.6cm;  now measuring 5.5 cm    Past Surgical History  Procedure Laterality Date  . Carotid endarterectomy Right 01/15/08  . Lumbar disc surgery  1998  . Pv angio  11/22/2007    PTA/ stenting of right common iliac artery with a 10x66mm Smart stent and postdilated with a 7x67mmpowerflex balloon; high grade calcified stenosis of the RICA  . Colonoscopy N/A 07/09/2013    Procedure: COLONOSCOPY;  Surgeon: Juanita Craver, MD;  Location: [facility];  Service: Endoscopy;  Laterality: N/A;  . Pv angio  04/01/14    AAA, Lt renal artery stenosis, patent iliacs  .  Abdominal aortagram N/A 04/01/2014    Procedure: ABDOMINAL Maxcine Ham;  Surgeon: [name], MD;  Location: [facility] C[facility] LAB;  Service: Cardiovascular;  Laterality: N/A;  . Back surgery  1988    at [facility]  . Coronary artery bypass graft  1996    LIMA to LAD, sequential vein to OM1 and 2 as well as acure marginal branch and diag branch    Pryor Ochoa RD, LDN  Inpatient Clinical Dietitian Pager: [phone] After Hours Pager: [phone]

## 2014-05-31 NOTE — Progress Notes (Signed)
Dr. Halford Chessman paged due to abg results. Told to increase rate from 16 to 18 and repeat gas in 2 hours. Also notified of decreased urine output. No new orders regarding but will continue to monitor. Barry Lawrence

## 2014-05-31 NOTE — Progress Notes (Signed)
[place] Progress Note Patient Name: OLIN ANDER DOB: 05/22/1946 MRN: [id number]   Date of Service  05/31/2014  HPI/Events of Note  Respiratory/metabolic acidosis.  eICU Interventions  Will increase RR to 22 and f/u ABG.     Intervention Category Major Interventions: Other:  Chantee Cerino 05/31/2014, 3:59 PM

## 2014-05-31 NOTE — Consult Note (Signed)
PULMONARY / CRITICAL CARE MEDICINE   Name: Barry Lawrence MRN: [id number] DOB: 07/12/1945    ADMISSION DATE:  05/30/2014 CONSULTATION DATE:  1/7  REFERRING MD :  [name]   CHIEF COMPLAINT:  Vent management   INITIAL PRESENTATION:  69 yo male smoker for repair of AAA.  Remained on vent post-op.   PMHx of CAD s/p CABG, CKD III, OSA on CPAP, DM, HTN, mild COPD.  STUDIES:  None  SIGNIFICANT EVENTS: 1/7 OR>> elective repair AAA  SUBJECTIVE:  Remains on pressors.  Hypoxia with SBT.  VITAL SIGNS: Temp:  [95.9 F (35.5 C)-100.8 F (38.2 C)] 99.5 F (37.5 C) (01/08 0900) Pulse Rate:  [69-95] 82 (01/08 0900) Resp:  [14-26] 14 (01/08 0900) BP: (94-120)/(52-67) 107/61 mmHg (01/08 0900) SpO2:  [91 %-100 %] 92 % (01/08 0900) Arterial Line BP: (86-167)/(46-78) 126/58 mmHg (01/08 0900) FiO2 (%):  [40 %-70 %] 40 % (01/08 0800) HEMODYNAMICS: PAP: (26-40)/(15-26) 32/22 mmHg CVP:  [7 mmHg-13 mmHg] 9 mmHg PCWP:  [14 mmHg-15 mmHg] 14 mmHg CO:  [5.2 L/min-8.9 L/min] 5.3 L/min CI:  [2.6 L/min/m2-4.4 L/min/m2] 2.6 L/min/m2 VENTILATOR SETTINGS: Vent Mode:  [-] PRVC FiO2 (%):  [40 %-70 %] 40 % Set Rate:  [14 bmp-26 bmp] 26 bmp Vt Set:  [530 mL] 530 mL PEEP:  [5 cmH20] 5 cmH20 Plateau Pressure:  [21 cmH20-29 cmH20] 22 cmH20 INTAKE / OUTPUT:  Intake/Output Summary (Last 24 hours) at 05/31/14 0910 Last data filed at 05/31/14 0900  Gross per 24 hour  Intake 18705.84 ml  Output   7440 ml  Net 11265.84 ml    PHYSICAL EXAMINATION: General: no distress Neuro: RASS -2, follows commands with WUA HEENT: ETT in place Cardiovascular: regular Lungs: no wheeze Abdomen: soft, wound site clean Musculoskeletal: 1+ edema Skin: mottling of legs  LABS:  CBC  Recent Labs Lab 05/30/14 1610  05/30/14 1733  05/31/14 0001 05/31/14 0430 05/31/14 0810  WBC 15.0*  --  13.8*  --   --  10.8*  --   HGB 12.8*  < > 13.1  < > 9.7* 9.6* 9.3*  HCT 37.6*  < > 37.4*  < > 28.0* 27.7* 27.0*  PLT 97*  --   97*  --   --  72*  --   < > = values in this interval not displayed.   Coag's  Recent Labs Lab 05/30/14 1733 05/30/14 2040 05/31/14 0430  APTT 39* 36 39*  INR 1.58* 1.42 1.46   BMET  Recent Labs Lab 05/31/14 0001 05/31/14 0430 05/31/14 0800  NA 140 138 140  K 3.9 4.0 4.0  CL 113* 110 112  CO2 20 21 19   BUN 25* 27* 29*  CREATININE 2.66* 2.95* 3.14*  GLUCOSE 146* 138* 138*   Electrolytes  Recent Labs Lab 05/30/14 1733  05/31/14 0001 05/31/14 0430 05/31/14 0800  CALCIUM 7.2*  < > 6.9* 6.8* 6.7*  MG 1.5  --   --  1.8  --   < > = values in this interval not displayed.   Sepsis Markers  Recent Labs Lab 05/30/14 1744  LATICACIDVEN 2.6*   ABG  Recent Labs Lab 05/30/14 1829 05/30/14 2224 05/31/14 0500  PHART 7.340* 7.351 7.410  PCO2ART 41.6 38.3 32.0*  PO2ART 60.0* 137.0* 86.7   Liver Enzymes  Recent Labs Lab 05/31/14 0430  AST 139*  ALT 45  ALKPHOS 26*  BILITOT 1.0  ALBUMIN 2.8*   Cardiac Enzymes No results for input(s): TROPONINI, PROBNP in the last 168 hours.  Glucose  Recent Labs Lab 05/30/14 0600 05/30/14 1931 05/31/14 0345 05/31/14 0432 05/31/14 0821  GLUCAP 90 143* 124* 130* 114*    Imaging Dg Chest Port 1 View  05/30/2014   CLINICAL DATA:  Evaluate position of Swan-Ganz catheter after adjustment.  EXAM: PORTABLE CHEST - 1 VIEW  COMPARISON:  05/30/2014  FINDINGS: Postoperative changes in the mediastinum. Endotracheal tube tip measures 4.7 cm above the carinal. Enteric tube tip is off the field of view but below the left hemidiaphragm. Swan-Ganz catheter tip projects over the right main pulmonary artery. Interval development of collapse or consolidation of the right upper lung. No blunting of costophrenic angles. Left lung is clear. Heart size and pulmonary vascularity are normal. Calcified and tortuous aorta.  IMPRESSION: Appliances appear to be in satisfactory position. Interval development of collapse or consolidation of the right  upper lung.   Electronically Signed   By: Lucienne Capers M.D.   On: 05/30/2014 21:23   Portable Chest Xray  05/30/2014   CLINICAL DATA:  Status post aortic aneurysm repair. Respiratory failure.  EXAM: PORTABLE CHEST - 1 VIEW  COMPARISON:  Chest CT 03/05/2014  FINDINGS: Endotracheal tube is 6.4 cm from the carina. Enteric tube in place, tip below the diaphragm not included in the field of view. Right internal jugular Swan-Ganz catheter tip in the region of the pulmonary outflow tract. Patient is post median sternotomy. The heart size is normal. No definite chest tubes are seen. There is no pleural effusion or pneumothorax. Minimal vascular congestion. No pulmonary edema.  IMPRESSION: 1. Endotracheal tube, enteric tube, and Swan-Ganz catheter in place. 2. Minimal vascular congestion.   Electronically Signed   By: Jeb Levering M.D.   On: 05/30/2014 18:09     ASSESSMENT / PLAN:  PULMONARY OETT 01/07 >>> A: Acute respiratory failure with hypoxia following surgery for AAA repair. Hx of OSA, COPD. P:   Decrease RR to 16 Continue full vent support Fio2 to keep Sao2 > 92% F/u CXR, ABG PRN BD's  CARDIOVASCULAR L radial a line 01/07 >>> R Luiz Blare 01/07 >>> A:  Hypovolemic/hemorrhagic shock after AAA repair. Hx of CAD s/p CABG, HTN, HLD, PAD. P:  Wean off DA, phenylephrine to keep MAP > 65 >> defer to VVS which to wean first Okay to d/c PA catheter wean okay with VVS Continue to follow CVP >> goal above 8 Post-op care per VVS Hold outpatient amlodipine, atorvastatin, carvedilol, ezetimibe, fenofibrate, hyzaar   RENAL A:   AKI in setting of hypovolemic/hemorrhagic shock. CKD III >> baseline creatinine 1.8 to 2.1. P:   Monitor renal fx, urine outpt Change IV fluid to NS at 125 ml/hr  GASTROINTESTINAL A:   Nutrition. Hx of GERD. P:   NPO Protonix for SUP Nutrition per VVS  HEMATOLOGIC A:  Acute blood loss anemia. P: F/u CBC Transfuse for Hb < 7 SCD for DVT  prevention  INFECTIOUS A:   Surgical prophylaxis. P:   Abx: Cerfuroxime x 2 doses.  ENDOCRINE A:   No acute issues. P:   Monitor blood sugars  NEUROLOGIC A:   Acute metabolic encephalopathy. P:   RASS goal -1   FAMILY  - Updates: wife bedside 1/7  - Inter-disciplinary family meet or Palliative Care meeting due by:  1/24   CC time 40 minutes.  Chesley Mires, MD [facility] Pulmonary/Critical Care 05/31/2014, 9:30 AM Pager:  [phone] After 3pm call: [phone]

## 2014-05-31 NOTE — Progress Notes (Addendum)
   [facility]  Subjective  - Intubated and sedated.   Objective 109/58 73 99.9 F (37.7 C) (Core (Comment)) 26 97%  Intake/Output Summary (Last 24 hours) at 05/31/14 0733 Last data filed at 05/31/14 0700  Gross per 24 hour  Intake 18126.84 ml  Output   7780 ml  Net 10346.84 ml    Doppler DP/PT bilaterally.  Feet look well perfused no modeling. Abdomin distended, positive hypo bowel sounds to auscultation.    Incision intact. Groins soft bilaterally Lungs intubated Heart SR 74 bpm    Assessment/Planning: Open AAA fem-fem by pass with left renal artery by pass  Foley to gravity 50 cc/hr Acute blood loss during surgery transfused 5 units of FFP and 1 unit of PRBC.  HGB stable 9.6 Abdomin distended with positive BS Lungs intubated and sedated thx CCM for vent help Vasopressors neo synephrine and Dopamine on board  Laurence Slate [facility] 05/31/2014 7:33 AM --  Laboratory Lab Results:  Recent Labs  05/30/14 1733  05/31/14 0001 05/31/14 0430  WBC 13.8*  --   --  10.8*  HGB 13.1  < > 9.7* 9.6*  HCT 37.4*  < > 28.0* 27.7*  PLT 97*  --   --  72*  < > = values in this interval not displayed. BMET  Recent Labs  05/31/14 0001 05/31/14 0430  NA 140 138  K 3.9 4.0  CL 113* 110  CO2 20 21  GLUCOSE 146* 138*  BUN 25* 27*  CREATININE 2.66* 2.95*  CALCIUM 6.9* 6.8*    COAG Lab Results  Component Value Date   INR 1.46 05/31/2014   INR 1.42 05/30/2014   INR 1.58* 05/30/2014   No results found for: PTT    I agree with the above. Postoperative day 1, status post open aneurysm repair using an 16 x 8 bifurcated graft as an aortobifemoral bypass.  Concomitantly he had a left renal artery bypass. CV: Continue to wean pressors as blood pressure indicates. Pulmonary: Wean vent as tolerated GI: Continue nothing by mouth with NG tube decompression  Patient's abdomen is slightly more full today than postop.  We'll continue to  monitor.  I doubt that he will need a washout but we'll continue to monitor.  He does not appear to be having any evidence of abdominal compartment syndrome.  Annamarie Major

## 2014-05-31 NOTE — Progress Notes (Signed)
PT Cancellation Note  Patient Details Name: Barry Lawrence MRN: [id number] DOB: 29-Nov-1945   Cancelled Treatment:    Reason Eval/Treat Not Completed: Medical issues which prohibited therapy (pt remains on vent with Gordy Councilman in place post op. )   Melford Aase 05/31/2014, 7:52 AM Elwyn Reach, [place]

## 2014-05-31 NOTE — Progress Notes (Signed)
OT Cancellation Note  Patient Details Name: Barry Lawrence MRN: [id number] DOB: 16-Apr-1946   Cancelled Treatment:    Reason Eval/Treat Not Completed: Patient not medically ready, remains intubated with Gordy Councilman in place. Will continue to follow.  Malka So 05/31/2014, 8:30 AM

## 2014-05-31 NOTE — Op Note (Signed)
Patient name: Barry Lawrence MRN: [id number] DOB: [birth year] Sex: male  05/30/2014 Pre-operative Diagnosis: Abdominal aortic aneurysm, left renal artery stenosis Post-operative diagnosis:  Same Surgeon:  Eldridge Abrahams Assistants:  Gae Gallop, Gerri Lins Procedure:   #1: Aortobifemoral bypass graft with 16 x 8 bifurcated graft   #2: Left renal artery bypass graft with 6 mm dacryon Anesthesia:  Gen. Blood Loss:  See anesthesia record Specimens:  None  Findings:  The proximal aortic anastomosis was below the renal arteries in a end to end fashion.  Bilateral distal anastomoses were to the common femoral arteries, distally.  I then performed a left renal artery bypass graft the graft comes off of the left lateral side of the tube portion of the graft and goes to the mid left renal artery, on the inferior side.  Indications:  The patient has a 5.6 cm aneurysm as well as a high-grade left renal artery stenosis.  He has stents within his iliac arteries which make a endovascular approach not feasible.  He is here today for revascularization.  Procedure:  The patient was identified in the holding area and taken to [place] [address]  The patient was then placed supine on the table. general anesthesia was administered.  The patient was prepped and draped in the usual sterile fashion.  A time out was called and antibiotics were administered.  Longitudinal incisions were made in bilateral groins.  Bilateral common femoral, superficial femoral and profunda femoral arteries were individually isolated.  Crossing vein branches under the inguinal ligament were divided.  Next, a midline incision was made from the xiphoid to below the umbilicus.  The fascia was then opened sharply.  It was extended throughout the length of the incision.  The abdomen was inspected.  There was no gross pathology present.  A Omni tract was used for exposure.  The transverse colon was reflected cephalad.  The small bowel was  mobilized to the patient's right.  The ligament of Treitz was taken down sharply.  I then exposed the aorta.  I dissected out the aortic neck to the level of the renal arteries.  Circumferential exposure was obtained here.  I then dissected out the left renal artery.  It was aneurysmal at its origin and then I dissected out to its bifurcation.  I did divide the gonadal vein.  The left renal vein was not divided.  Next, I exposed bilateral common iliac arteries.  A rent was made in the right common iliac artery.  I tried to repair this with a suture but it tore the artery.  Therefore I I control this manually with a finger I elected to heparinize the patient at this point.  After the heparin circulated, I used a Harken clamp to occlude the aorta, below the renal arteries.  I then opened the aorta with an 11 blade and curved Mayo scissors.  I passed the Fogarty catheters and each iliac artery for distal control.  There was significant laminated thrombus that was removed.  There were no backbleeding lumbar arteries to ligate.  I then ligated bilateral common iliac arteries with Ethibond as well as 0 silk suture.  I used a 3-0 Prolene to oversew the orifice.  I then selected a 16 x 8 bifurcated dacryon graft.  I took a 6 mm dacryon graft and sewed it onto the left lateral side of the graft for later renal bypass.  I then performed a end to end anastomosis between the aorta  and the graft using 3 horizontal mattress 3-0 Prolene sutures.  A felt strip was used for the anastomosis.  This was tested and found to be hemostatic.  I then placed a 20 dacryon tube graft as a cough over the anastomosis.  Next the renal artery was occluded proximally with a coarctation clamp.  Silastic loops were used to occlude the branches.  I then cut the side arm that I sewed onto the tube portion of the graft, to the appropriate length.  A #11 blade was used to make an arteriotomy on the lower portion of the left renal artery.  This was  extended with Potts scissors.  I then performed a running anastomosis with 6-0 Prolene in a end-to-side fashion.  Once this was completed, bloodflow was reestablish the left kidney.  Next each limb of the graft was brought down to the groin, posterior to the ureters.  I performed the right femoral anastomosis first.  This was to the distal common femoral artery.  I did pass #4 Fogarty catheters down each leg.  I could only advance the Fogarty on the right leg down to the adductor canal it felt like it hit chronic disease.  No thrombus was evacuated.  On the left I did evacuate some thrombus.  Both femoral anastomosis was to the distal common femoral artery to the level of the profunda femoral artery with 5-0 Prolene.  Blood flow was given back sequentially to each leg.  At this point the patient was somewhat coagulopathic.  He was given protamine as well as FFP.  The abdomen was inspected and found to be relatively hemostatic.  The groins were closed first with multiple layers of 2030 and 4-0 Vicryl.  I then reinspected the abdomen.  It appeared to be relatively hemostatic.  The aorta was closed over top of the graft with 2-0 Vicryl.  I then closed the retroperitoneum with 2-0 Vicryl.  The bowel was then run throughout its length.  There was no gross abnormalities.  It appeared to be well-perfused.  It was placed back into its anatomic position.  The fascia was then closed with running #1 PDS suture.  The subcutaneous tissue was closed with 2-0 Vicryl the skin closed with 4-0 Vicryl.  Dermabond was applied on all incisions.  Patient had good pedal pulses after the case.  He was taken intubated to the surgical intensive care unit.   Disposition:  To PACU in stable condition.   Theotis Burrow, M.D. [facility] Office: [phone] Pager:  [phone]

## 2014-06-01 ENCOUNTER — Inpatient Hospital Stay (HOSPITAL_COMMUNITY): Payer: Medicare Other

## 2014-06-01 DIAGNOSIS — E872 Acidosis: Secondary | ICD-10-CM

## 2014-06-01 LAB — BASIC METABOLIC PANEL
Anion gap: 4 — ABNORMAL LOW (ref 5–15)
BUN: 41 mg/dL — ABNORMAL HIGH (ref 6–23)
CO2: 24 mmol/L (ref 19–32)
Calcium: 6.5 mg/dL — ABNORMAL LOW (ref 8.4–10.5)
Chloride: 113 mEq/L — ABNORMAL HIGH (ref 96–112)
Creatinine, Ser: 4.17 mg/dL — ABNORMAL HIGH (ref 0.50–1.35)
GFR calc Af Amer: 16 mL/min — ABNORMAL LOW (ref >90)
GFR calc non Af Amer: 13 mL/min — ABNORMAL LOW (ref >90)
Glucose, Bld: 127 mg/dL — ABNORMAL HIGH (ref 70–99)
Potassium: 4.1 mmol/L (ref 3.5–5.1)
Sodium: 141 mmol/L (ref 135–145)

## 2014-06-01 LAB — COMPREHENSIVE METABOLIC PANEL
ALT: 71 U/L — ABNORMAL HIGH (ref 0–53)
AST: 211 U/L — ABNORMAL HIGH (ref 0–37)
Albumin: 2.3 g/dL — ABNORMAL LOW (ref 3.5–5.2)
Alkaline Phosphatase: 29 U/L — ABNORMAL LOW (ref 39–117)
Anion gap: 12 (ref 5–15)
BUN: 38 mg/dL — ABNORMAL HIGH (ref 6–23)
CO2: 18 mmol/L — ABNORMAL LOW (ref 19–32)
Calcium: 6.5 mg/dL — ABNORMAL LOW (ref 8.4–10.5)
Chloride: 110 mEq/L (ref 96–112)
Creatinine, Ser: 4 mg/dL — ABNORMAL HIGH (ref 0.50–1.35)
GFR calc Af Amer: 16 mL/min — ABNORMAL LOW (ref >90)
GFR calc non Af Amer: 14 mL/min — ABNORMAL LOW (ref >90)
Glucose, Bld: 123 mg/dL — ABNORMAL HIGH (ref 70–99)
Potassium: 4.3 mmol/L (ref 3.5–5.1)
Sodium: 140 mmol/L (ref 135–145)
Total Bilirubin: 0.7 mg/dL (ref 0.3–1.2)
Total Protein: 4.2 g/dL — ABNORMAL LOW (ref 6.0–8.3)

## 2014-06-01 LAB — CBC
HCT: 24.8 % — ABNORMAL LOW (ref 39.0–52.0)
Hemoglobin: 8.2 g/dL — ABNORMAL LOW (ref 13.0–17.0)
MCH: 30.1 pg (ref 26.0–34.0)
MCHC: 33.1 g/dL (ref 30.0–36.0)
MCV: 91.2 fL (ref 78.0–100.0)
Platelets: 76 10*3/uL — ABNORMAL LOW (ref 150–400)
RBC: 2.72 MIL/uL — ABNORMAL LOW (ref 4.22–5.81)
RDW: 15.7 % — ABNORMAL HIGH (ref 11.5–15.5)
WBC: 12.7 10*3/uL — ABNORMAL HIGH (ref 4.0–10.5)

## 2014-06-01 LAB — POCT I-STAT 3, ART BLOOD GAS (G3+)
Acid-base deficit: 6 mmol/L — ABNORMAL HIGH (ref 0.0–2.0)
Acid-base deficit: 6 mmol/L — ABNORMAL HIGH (ref 0.0–2.0)
Bicarbonate: 20.5 mEq/L (ref 20.0–24.0)
Bicarbonate: 20.1 mEq/L (ref 20.0–24.0)
O2 Saturation: 94 %
O2 Saturation: 93 %
Patient temperature: 37.4
Patient temperature: 37.9
TCO2: 22 mmol/L (ref 0–100)
TCO2: 21 mmol/L (ref 0–100)
pCO2 arterial: 43.5 mmHg (ref 35.0–45.0)
pCO2 arterial: 44.1 mmHg (ref 35.0–45.0)
pH, Arterial: 7.284 — ABNORMAL LOW (ref 7.350–7.450)
pH, Arterial: 7.271 — ABNORMAL LOW (ref 7.350–7.450)
pO2, Arterial: 81 mmHg (ref 80.0–100.0)
pO2, Arterial: 78 mmHg — ABNORMAL LOW (ref 80.0–100.0)

## 2014-06-01 LAB — GLUCOSE, CAPILLARY
Glucose-Capillary: 95 mg/dL (ref 70–99)
Glucose-Capillary: 94 mg/dL (ref 70–99)
Glucose-Capillary: 111 mg/dL — ABNORMAL HIGH (ref 70–99)
Glucose-Capillary: 108 mg/dL — ABNORMAL HIGH (ref 70–99)

## 2014-06-01 LAB — CREATININE, URINE, RANDOM: Creatinine, Urine: 160.05 mg/dL

## 2014-06-01 LAB — PHOSPHORUS: Phosphorus: 5.2 mg/dL — ABNORMAL HIGH (ref 2.3–4.6)

## 2014-06-01 LAB — MAGNESIUM: Magnesium: 1.9 mg/dL (ref 1.5–2.5)

## 2014-06-01 LAB — SODIUM, URINE, RANDOM: Sodium, Ur: 19 mmol/L

## 2014-06-01 MED ORDER — SODIUM CHLORIDE 0.9 % IV BOLUS (SEPSIS)
1000.0000 mL | Freq: Once | INTRAVENOUS | Status: AC
  Administered 2014-06-01: 1000 mL via INTRAVENOUS

## 2014-06-01 MED ORDER — SODIUM BICARBONATE 8.4 % IV SOLN
INTRAVENOUS | Status: AC
  Administered 2014-06-01 – 2014-06-02 (×3): via INTRAVENOUS
  Filled 2014-06-01 (×10): qty 150

## 2014-06-01 NOTE — Progress Notes (Signed)
Subjective: Interval History: none.. Sedated on vent  Objective: Vital signs in last 24 hours: Temp:  [99 F (37.2 C)-99.7 F (37.6 C)] 99.1 F (37.3 C) (01/09 0700) Pulse Rate:  [67-87] 77 (01/09 0700) Resp:  [12-26] 22 (01/09 0700) BP: (83-177)/(46-70) 96/51 mmHg (01/09 0700) SpO2:  [92 %-100 %] 100 % (01/09 0700) Arterial Line BP: (89-153)/(37-58) 124/52 mmHg (01/09 0700) FiO2 (%):  [40 %-50 %] 50 % (01/09 0600) Weight:  [218 lb 4.1 oz (99 kg)] 218 lb 4.1 oz (99 kg) (01/09 0400)  Intake/Output from previous day: 01/08 0701 - 01/09 0700 In: 5655 [I.V.:5595; NG/GT:60] Out: 2365 [Urine:565; Emesis/NG output:1800] Intake/Output this shift: Total I/O In: 30 [NG/GT:30] Out: 120 [Urine:20; Emesis/NG output:100]  Abd soft.  Palp popliteal pulse bilat. Resp clear   Lab Results:  Recent Labs  05/31/14 1550 06/01/14 0318  WBC 15.6* 12.7*  HGB 9.0* 8.2*  HCT 27.0* 24.8*  PLT 83* 76*   BMET  Recent Labs  05/31/14 1550 06/01/14 0318  NA 140 140  K 4.6 4.3  CL 109 110  CO2 20 18*  GLUCOSE 132* 123*  BUN 33* 38*  CREATININE 3.65* 4.00*  CALCIUM 6.6* 6.5*    Studies/Results: Dg Chest Port 1 View  05/31/2014   CLINICAL DATA:  Status post aortofemoral bypass graft.  EXAM: PORTABLE CHEST - 1 VIEW  COMPARISON:  May 30, 2014.  FINDINGS: Status post coronary artery bypass graft. Endotracheal and nasogastric tubes are unchanged in position. Right internal jugular Swan-Ganz catheter is noted with tip directed into the right pulmonary artery. Stable right upper lobe opacity is noted most consistent with right upper lobe atelectasis or pneumonia. No pneumothorax or significant pleural effusion is noted.  IMPRESSION: Stable support apparatus. Stable right upper lobe opacity is noted most consistent with pneumonia or atelectasis of the right upper lobe.   Electronically Signed   By: Sabino Dick M.D.   On: 05/31/2014 08:04   Dg Chest Port 1 View  05/30/2014   CLINICAL DATA:   Evaluate position of Swan-Ganz catheter after adjustment.  EXAM: PORTABLE CHEST - 1 VIEW  COMPARISON:  05/30/2014  FINDINGS: Postoperative changes in the mediastinum. Endotracheal tube tip measures 4.7 cm above the carinal. Enteric tube tip is off the field of view but below the left hemidiaphragm. Swan-Ganz catheter tip projects over the right main pulmonary artery. Interval development of collapse or consolidation of the right upper lung. No blunting of costophrenic angles. Left lung is clear. Heart size and pulmonary vascularity are normal. Calcified and tortuous aorta.  IMPRESSION: Appliances appear to be in satisfactory position. Interval development of collapse or consolidation of the right upper lung.   Electronically Signed   By: Lucienne Capers M.D.   On: 05/30/2014 21:23   Portable Chest Xray  05/30/2014   CLINICAL DATA:  Status post aortic aneurysm repair. Respiratory failure.  EXAM: PORTABLE CHEST - 1 VIEW  COMPARISON:  Chest CT 03/05/2014  FINDINGS: Endotracheal tube is 6.4 cm from the carina. Enteric tube in place, tip below the diaphragm not included in the field of view. Right internal jugular Swan-Ganz catheter tip in the region of the pulmonary outflow tract. Patient is post median sternotomy. The heart size is normal. No definite chest tubes are seen. There is no pleural effusion or pneumothorax. Minimal vascular congestion. No pulmonary edema.  IMPRESSION: 1. Endotracheal tube, enteric tube, and Swan-Ganz catheter in place. 2. Minimal vascular congestion.   Electronically Signed   By: Fonnie Birkenhead.D.  On: 05/30/2014 18:09   Anti-infectives: Anti-infectives    Start     Dose/Rate Route Frequency Ordered Stop   05/31/14 0100  cefUROXime (ZINACEF) 1.5 g in dextrose 5 % 50 mL IVPB     1.5 g100 mL/hr over 30 Minutes Intravenous Every 12 hours 05/30/14 1732 05/31/14 1235   05/30/14 1330  cefUROXime (ZINACEF) 1.5 g in dextrose 5 % 50 mL IVPB  Status:  Discontinued     1.5 g100 mL/hr  over 30 Minutes Intravenous To Surgery 05/30/14 1326 05/30/14 1716   05/29/14 1455  cefUROXime (ZINACEF) 1.5 g in dextrose 5 % 50 mL IVPB     1.5 g100 mL/hr over 30 Minutes Intravenous 30 min pre-op 05/29/14 1455 05/30/14 1343      Assessment/Plan: s/p Procedure(s): AORTOBIFEMORAL BYPASS GRAFT (N/A) LEFT RENAL ARTERY BYPASS (Left) Stable pod 1.  Urine output adequate. Creatinine up slightly from baseline. Will repeat tomorrow. Main issue remains pulmonary. Appreciate pulmonary/critical care assistance.   LOS: 2 days   Barry Lawrence 06/01/2014, 8:25 AM

## 2014-06-01 NOTE — Progress Notes (Signed)
PULMONARY / CRITICAL CARE MEDICINE   Name: Barry Lawrence MRN: [id number] DOB: April 05, 1946    ADMISSION DATE:  05/30/2014 CONSULTATION DATE:  05/30/2014  REFERRING MD :  [name]   CHIEF COMPLAINT:  Vent management   INITIAL PRESENTATION:  69 yo male smoker for repair of AAA.  Remained on vent post-op.   PMHx of CAD s/p CABG, CKD III, OSA on CPAP, DM, HTN, mild COPD.  STUDIES:  None  SIGNIFICANT EVENTS: 1/7 OR>> elective repair AAA  SUBJECTIVE:  Weaning pressors.  Poor urine outpt.  VITAL SIGNS: Temp:  [99 F (37.2 C)-99.7 F (37.6 C)] 99.1 F (37.3 C) (01/09 0700) Pulse Rate:  [67-87] 77 (01/09 0700) Resp:  [12-26] 22 (01/09 0700) BP: (83-177)/(46-70) 96/51 mmHg (01/09 0700) SpO2:  [92 %-100 %] 100 % (01/09 0700) Arterial Line BP: (89-153)/(37-58) 124/52 mmHg (01/09 0700) FiO2 (%):  [40 %-50 %] 50 % (01/09 0600) Weight:  [218 lb 4.1 oz (99 kg)] 218 lb 4.1 oz (99 kg) (01/09 0400) HEMODYNAMICS: PAP: (27-47)/(13-25) 44/23 mmHg CVP:  [5 mmHg-65 mmHg] 10 mmHg PCWP:  [6.7 mmHg-7.1 mmHg] 7.1 mmHg CO:  [3.3 L/min-6.5 L/min] 3.5 L/min CI:  [3.2 L/min/m2] 3.2 L/min/m2 VENTILATOR SETTINGS: Vent Mode:  [-] PRVC FiO2 (%):  [40 %-50 %] 50 % Set Rate:  [16 bmp-26 bmp] 22 bmp Vt Set:  [530 mL] 530 mL PEEP:  [5 cmH20] 5 cmH20 Plateau Pressure:  [19 cmH20-28 cmH20] 28 cmH20 INTAKE / OUTPUT:  Intake/Output Summary (Last 24 hours) at 06/01/14 0753 Last data filed at 06/01/14 0740  Gross per 24 hour  Intake 5654.95 ml  Output   2385 ml  Net 3269.95 ml    PHYSICAL EXAMINATION: General: no distress Neuro: RASS -1, follows commands with WUA HEENT: ETT in place Cardiovascular: regular Lungs: no wheeze Abdomen: soft, wound site clean Musculoskeletal: 1+ edema Skin: mottling of legs  LABS:  CBC  Recent Labs Lab 05/31/14 0430 05/31/14 0810 05/31/14 1550 06/01/14 0318  WBC 10.8*  --  15.6* 12.7*  HGB 9.6* 9.3* 9.0* 8.2*  HCT 27.7* 27.0* 27.0* 24.8*  PLT 72*  --  83*  76*     Coag's  Recent Labs Lab 05/30/14 1733 05/30/14 2040 05/31/14 0430  APTT 39* 36 39*  INR 1.58* 1.42 1.46   BMET  Recent Labs Lab 05/31/14 0800 05/31/14 1550 06/01/14 0318  NA 140 140 140  K 4.0 4.6 4.3  CL 112 109 110  CO2 19 20 18*  BUN 29* 33* 38*  CREATININE 3.14* 3.65* 4.00*  GLUCOSE 138* 132* 123*   Electrolytes  Recent Labs Lab 05/30/14 1733  05/31/14 0430 05/31/14 0800 05/31/14 1550 06/01/14 0318  CALCIUM 7.2*  < > 6.8* 6.7* 6.6* 6.5*  MG 1.5  --  1.8  --   --  1.9  PHOS  --   --   --   --   --  5.2*  < > = values in this interval not displayed.   Sepsis Markers  Recent Labs Lab 05/30/14 1744  LATICACIDVEN 2.6*   ABG  Recent Labs Lab 05/31/14 1551 05/31/14 1846 06/01/14 0320  PHART 7.237* 7.241* 7.284*  PCO2ART 50.3* 48.8* 43.5  PO2ART 67.0* 64.0* 81.0   Liver Enzymes  Recent Labs Lab 05/31/14 0430 06/01/14 0318  AST 139* 211*  ALT 45 71*  ALKPHOS 26* 29*  BILITOT 1.0 0.7  ALBUMIN 2.8* 2.3*   Cardiac Enzymes No results for input(s): TROPONINI, PROBNP in the last 168 hours.  Glucose  Recent Labs Lab 05/31/14 0432 05/31/14 0821 05/31/14 1200 05/31/14 1605 05/31/14 1933 05/31/14 2334  GLUCAP 130* 114* 121* 109* 103* 110*    Imaging Dg Chest Port 1 View  05/31/2014   CLINICAL DATA:  Status post aortofemoral bypass graft.  EXAM: PORTABLE CHEST - 1 VIEW  COMPARISON:  May 30, 2014.  FINDINGS: Status post coronary artery bypass graft. Endotracheal and nasogastric tubes are unchanged in position. Right internal jugular Swan-Ganz catheter is noted with tip directed into the right pulmonary artery. Stable right upper lobe opacity is noted most consistent with right upper lobe atelectasis or pneumonia. No pneumothorax or significant pleural effusion is noted.  IMPRESSION: Stable support apparatus. Stable right upper lobe opacity is noted most consistent with pneumonia or atelectasis of the right upper lobe.    Electronically Signed   By: Sabino Dick M.D.   On: 05/31/2014 08:04     ASSESSMENT / PLAN:  PULMONARY OETT 01/07 >>> A: Acute respiratory failure with hypoxia following surgery for AAA repair. Hx of OSA, COPD. P:   Continue RR 22 Continue full vent support Fio2 to keep Sao2 > 92% F/u CXR, ABG PRN BD's  CARDIOVASCULAR L radial a line 01/07 >>> R Luiz Blare 01/07 >>> A:  Hypovolemic/hemorrhagic shock after AAA repair. Hx of CAD s/p CABG, HTN, HLD, PAD. P:  Wean off DA, phenylephrine to keep MAP > 65 >> weaning of phenylephrine first Okay to d/c PA catheter wean okay with VVS Continue to follow CVP >> goal above 8 Post-op care per VVS Hold outpatient amlodipine, atorvastatin, carvedilol, ezetimibe, fenofibrate, hyzaar   RENAL A:   AKI in setting of hypovolemic/hemorrhagic shock. Non anion gap metabolic acidosis. CKD III >> baseline creatinine 1.8 to 2.1. P:   Monitor renal fx, urine outpt Check FeNa 1 liter NS fluid bolus x 1 on 1/09 Change IV fluid to D5W with 3 amps HCO3 at 125 ml/hr F/u BMET, ABG in PM of 1/09 Defer to VVS whether to consult nephrology >> creatinine might be peaking, and might be able to ride this out  GASTROINTESTINAL A:   Nutrition. Hx of GERD. P:   NPO Protonix for SUP Nutrition per VVS  HEMATOLOGIC A:  Acute blood loss anemia. P: F/u CBC Transfuse for Hb < 7 SCD for DVT prevention Check iron studies  INFECTIOUS A:   Surgical prophylaxis >> off Abx 1/08. P:   Monitor off Abx  ENDOCRINE A:   No acute issues. P:   Monitor blood sugars  NEUROLOGIC A:   Acute metabolic encephalopathy. P:   RASS goal -1 Wean off versed gtt as tolerated Continue fentanyl gtt  Inter-disciplinary family meet or Palliative Care meeting due by:  1/24   CC time 35 minutes.  Chesley Mires, MD [facility] Pulmonary/Critical Care 06/01/2014, 7:53 AM Pager:  [phone] After 3pm call: [phone]

## 2014-06-01 NOTE — Progress Notes (Signed)
Called Dr. Halford Chessman to report a CVP of 6 and to advise that since the last bolus urine output had increased.  One liter bolus ordered.  Requested swan be maintained for the purpose of access.  Dr. Halford Chessman advised that a triple lumen will be placed via the cordis to provide more access.  He stated someone on his staff would come this afternoon. Pt only has a cordis with 2 PIVs.  Since the addition of the bicarb drip, there is a need for more access since the pt is on neo, dopamine, fent,, versed and now the bicarb drip.

## 2014-06-01 NOTE — Progress Notes (Signed)
Called Dr. Halford Chessman and notified him of the latest ABG, u/o, BMP and CVP.  Advised him the ABG only reflected 4 hours of the bicarb. driip infusing.  No vent changes, one liter bolus ordered.

## 2014-06-02 ENCOUNTER — Inpatient Hospital Stay (HOSPITAL_COMMUNITY): Payer: Medicare Other

## 2014-06-02 LAB — POCT I-STAT 3, ART BLOOD GAS (G3+)
Acid-base deficit: 1 mmol/L (ref 0.0–2.0)
Bicarbonate: 23.9 mEq/L (ref 20.0–24.0)
O2 Saturation: 95 %
Patient temperature: 99.7
TCO2: 25 mmol/L (ref 0–100)
pCO2 arterial: 42.9 mmHg (ref 35.0–45.0)
pH, Arterial: 7.356 (ref 7.350–7.450)
pO2, Arterial: 83 mmHg (ref 80.0–100.0)

## 2014-06-02 LAB — FERRITIN: Ferritin: 3078 ng/mL — ABNORMAL HIGH (ref 22–322)

## 2014-06-02 LAB — CBC
HCT: 20.4 % — ABNORMAL LOW (ref 39.0–52.0)
Hemoglobin: 6.9 g/dL — CL (ref 13.0–17.0)
MCH: 31.2 pg (ref 26.0–34.0)
MCHC: 33.8 g/dL (ref 30.0–36.0)
MCV: 92.3 fL (ref 78.0–100.0)
Platelets: 79 10*3/uL — ABNORMAL LOW (ref 150–400)
RBC: 2.21 MIL/uL — ABNORMAL LOW (ref 4.22–5.81)
RDW: 15.6 % — ABNORMAL HIGH (ref 11.5–15.5)
WBC: 9.4 10*3/uL (ref 4.0–10.5)

## 2014-06-02 LAB — BASIC METABOLIC PANEL
Anion gap: 7 (ref 5–15)
BUN: 43 mg/dL — ABNORMAL HIGH (ref 6–23)
CO2: 26 mmol/L (ref 19–32)
Calcium: 6.6 mg/dL — ABNORMAL LOW (ref 8.4–10.5)
Chloride: 109 mEq/L (ref 96–112)
Creatinine, Ser: 4.31 mg/dL — ABNORMAL HIGH (ref 0.50–1.35)
GFR calc Af Amer: 15 mL/min — ABNORMAL LOW (ref >90)
GFR calc non Af Amer: 13 mL/min — ABNORMAL LOW (ref >90)
Glucose, Bld: 133 mg/dL — ABNORMAL HIGH (ref 70–99)
Potassium: 3.6 mmol/L (ref 3.5–5.1)
Sodium: 142 mmol/L (ref 135–145)

## 2014-06-02 LAB — IRON AND TIBC
Iron: 12 ug/dL — ABNORMAL LOW (ref 42–165)
Saturation Ratios: 7 % — ABNORMAL LOW (ref 20–55)
TIBC: 184 ug/dL — ABNORMAL LOW (ref 215–435)
UIBC: 172 ug/dL (ref 125–400)

## 2014-06-02 LAB — GLUCOSE, CAPILLARY
Glucose-Capillary: 104 mg/dL — ABNORMAL HIGH (ref 70–99)
Glucose-Capillary: 85 mg/dL (ref 70–99)
Glucose-Capillary: 85 mg/dL (ref 70–99)
Glucose-Capillary: 89 mg/dL (ref 70–99)
Glucose-Capillary: 93 mg/dL (ref 70–99)

## 2014-06-02 LAB — PREPARE RBC (CROSSMATCH)

## 2014-06-02 MED ORDER — SODIUM CHLORIDE 0.9 % IV SOLN
Freq: Once | INTRAVENOUS | Status: AC
  Administered 2014-06-02: 06:00:00 via INTRAVENOUS

## 2014-06-02 MED ORDER — FUROSEMIDE 10 MG/ML IJ SOLN
40.0000 mg | Freq: Once | INTRAMUSCULAR | Status: AC
  Administered 2014-06-02: 40 mg via INTRAVENOUS
  Filled 2014-06-02: qty 4

## 2014-06-02 NOTE — Progress Notes (Addendum)
PULMONARY / CRITICAL CARE MEDICINE   Name: Barry Lawrence MRN: [id number] DOB: Nov 29, 1945    ADMISSION DATE:  05/30/2014 CONSULTATION DATE:  05/30/2014  REFERRING MD :  [name]   CHIEF COMPLAINT:  Vent management   INITIAL PRESENTATION:  69 yo male smoker for repair of AAA.  Remained on vent post-op.   PMHx of CAD s/p CABG, CKD III, OSA on CPAP, DM, HTN, mild COPD.  STUDIES:  None  SIGNIFICANT EVENTS: 1/07 OR>> elective repair AAA 1/10 transfuse 2 units PRBC, renal consulted  SUBJECTIVE:  Weaning pressors.  Urine outpt improved, but CXR looks wet and more peripheral edema.  VITAL SIGNS: Temp:  [98.1 F (36.7 C)-100.6 F (38.1 C)] 98.6 F (37 C) (01/10 0815) Pulse Rate:  [71-88] 73 (01/10 0815) Resp:  [21-23] 22 (01/10 0815) BP: (97-146)/(44-63) 111/60 mmHg (01/10 0800) SpO2:  [94 %-100 %] 98 % (01/10 0815) Arterial Line BP: (116-156)/(44-58) 134/51 mmHg (01/10 0815) FiO2 (%):  [40 %] 40 % (01/10 0900) Weight:  [232 lb 9.4 oz (105.5 kg)] 232 lb 9.4 oz (105.5 kg) (01/10 0700) HEMODYNAMICS: PAP: (35-48)/(15-27) 45/23 mmHg CVP:  [6 mmHg-15 mmHg] 9 mmHg VENTILATOR SETTINGS: Vent Mode:  [-] PRVC FiO2 (%):  [40 %] 40 % Set Rate:  [20 bmp-22 bmp] 20 bmp Vt Set:  [530 mL] 530 mL PEEP:  [5 cmH20] 5 cmH20 Plateau Pressure:  [22 cmH20-29 cmH20] 22 cmH20 INTAKE / OUTPUT:  Intake/Output Summary (Last 24 hours) at 06/02/14 0931 Last data filed at 06/02/14 0900  Gross per 24 hour  Intake 5746.37 ml  Output   3115 ml  Net 2631.37 ml    PHYSICAL EXAMINATION: General: no distress Neuro: RASS -2 HEENT: ETT in place Cardiovascular: regular Lungs: no wheeze Abdomen: soft, wound site clean Musculoskeletal: 1+ edema Skin: no rashes  LABS:  CBC  Recent Labs Lab 05/31/14 1550 06/01/14 0318 06/02/14 0330  WBC 15.6* 12.7* 9.4  HGB 9.0* 8.2* 6.9*  HCT 27.0* 24.8* 20.4*  PLT 83* 76* 79*     Coag's  Recent Labs Lab 05/30/14 1733 05/30/14 2040 05/31/14 0430  APTT  39* 36 39*  INR 1.58* 1.42 1.46   BMET  Recent Labs Lab 06/01/14 0318 06/01/14 1620 06/02/14 0330  NA 140 141 142  K 4.3 4.1 3.6  CL 110 113* 109  CO2 18* 24 26  BUN 38* 41* 43*  CREATININE 4.00* 4.17* 4.31*  GLUCOSE 123* 127* 133*   Electrolytes  Recent Labs Lab 05/30/14 1733  05/31/14 0430  06/01/14 0318 06/01/14 1620 06/02/14 0330  CALCIUM 7.2*  < > 6.8*  < > 6.5* 6.5* 6.6*  MG 1.5  --  1.8  --  1.9  --   --   PHOS  --   --   --   --  5.2*  --   --   < > = values in this interval not displayed.   Sepsis Markers  Recent Labs Lab 05/30/14 1744  LATICACIDVEN 2.6*   ABG  Recent Labs Lab 06/01/14 0320 06/01/14 1624 06/02/14 0338  PHART 7.284* 7.271* 7.356  PCO2ART 43.5 44.1 42.9  PO2ART 81.0 78.0* 83.0   Liver Enzymes  Recent Labs Lab 05/31/14 0430 06/01/14 0318  AST 139* 211*  ALT 45 71*  ALKPHOS 26* 29*  BILITOT 1.0 0.7  ALBUMIN 2.8* 2.3*    Glucose  Recent Labs Lab 05/31/14 1933 05/31/14 2334 06/01/14 0821 06/01/14 1150 06/01/14 1633 06/01/14 2100  GLUCAP 103* 110* 95 94 111* 108*  Imaging Dg Chest Port 1 View  06/01/2014   CLINICAL DATA:  Central line complication, initial encounter  EXAM: PORTABLE CHEST - 1 VIEW  COMPARISON:  06/01/2014 at 6:44 a.m.  FINDINGS: Moderate cardiac enlargement. Status post CABG. No change endotracheal tube. No change orogastric tube. Right Swan-Ganz central venous catheter has been retracted and now is seen with tip over the cavoatrial junction.  Vascular congestion identified with persistent right mid lung discoid atelectasis. Mild interstitial prominence suggesting an element of interstitial edema, which represents change from prior study.  IMPRESSION: Retraction of right Swan-Ganz central venous catheter.  Atelectasis right mid lung zone with development of mild interstitial pulmonary edema.   Electronically Signed   By: Skipper Cliche M.D.   On: 06/01/2014 20:59   Dg Chest Port 1 View  06/01/2014    CLINICAL DATA:  Acute respiratory failure and hypoxia. Subsequent encounter.  EXAM: PORTABLE CHEST - 1 VIEW  COMPARISON:  Chest radiograph performed 05/31/2014  FINDINGS: The patient's endotracheal tube is seen ending 5 cm above the carina. An enteric tube is seen extending below the diaphragm. A right IJ Swan-Ganz catheter is seen ending about the right main pulmonary artery.  There has been interval improvement in right upper lobe airspace opacification, reflecting improving pneumonia. No pleural effusion or pneumothorax is seen.  The cardiomediastinal silhouette is mildly enlarged. The patient is status post mid sternotomy, with evidence of prior CABG. No acute osseous abnormalities are identified.  IMPRESSION: Interval improvement in right upper lobe pneumonia.   Electronically Signed   By: Garald Balding M.D.   On: 06/01/2014 08:24     ASSESSMENT / PLAN:  PULMONARY OETT 01/07 >>> A: Acute respiratory failure with hypoxia following surgery for AAA repair. Developing pulmonary edema 1/10. Hx of OSA, COPD. P:   Continue full vent support Fio2 to keep Sao2 > 92% F/u CXR, ABG PRN BD's  CARDIOVASCULAR L radial a line 01/07 >>> R Swan 01/07 >>> 1/09 Rt IJ CVL through cortise 1/09 >> A:  Hypovolemic/hemorrhagic shock after AAA repair. Hx of CAD s/p CABG, HTN, HLD, PAD. P:  Wean off DA to keep MAP > 65  Goal CVP 8 to 12 Post-op care per VVS Hold outpatient amlodipine, atorvastatin, carvedilol, ezetimibe, fenofibrate, hyzaar   RENAL A:   AKI in setting of hypovolemic/hemorrhagic shock >> Urine Na 19, Urine creatinine 160 from 1/09. Non anion gap metabolic acidosis >> improved. CKD III >> baseline creatinine 1.8 to 2.1. P:   Monitor renal fx, urine outpt Nephrology consulted 1/10 >> continue HCO3 in IV fluid for now, and defer further adjustments to nephrology  GASTROINTESTINAL A:   Nutrition. Hx of GERD. P:   NPO Protonix for SUP Nutrition per VVS  HEMATOLOGIC A:  Acute  blood loss anemia. Thrombocytopenia >> PLT 207 from 05/22/14. P: F/u CBC Transfuse for Hb < 7 SCD for DVT prevention  INFECTIOUS A:   Surgical prophylaxis >> off Abx 1/08. P:   Monitor off Abx Check sputum cx 1/10  ENDOCRINE A:   No acute issues. P:   Monitor blood sugars  NEUROLOGIC A:   Acute metabolic encephalopathy. P:   RASS goal -1 Wean off versed gtt as tolerated Continue fentanyl gtt  Inter-disciplinary family meet or Palliative Care meeting due by:  1/24  Discussed plan with Dr. Donnetta Hutching.  CC time 35 minutes.  Chesley Mires, MD [facility] Pulmonary/Critical Care 06/02/2014, 9:31 AM Pager:  [phone] After 3pm call: [phone]

## 2014-06-02 NOTE — Progress Notes (Signed)
[facility] Physician-Brief Progress Note Patient Name: Barry Lawrence DOB: 30-Jan-1946 MRN: [id number]   Date of Service  06/02/2014  HPI/Events of Note  Hgb now 6.9 down from 8.2 on 1/9  eICU Interventions  Plan: Transfuse 1 u pRBC Post-transfusion CBC     Intervention Category Intermediate Interventions: Bleeding - evaluation and treatment with blood products  DETERDING,ELIZABETH 06/02/2014, 5:24 AM

## 2014-06-02 NOTE — Consult Note (Addendum)
Reason for Consult: Acute renal failure on chronic kidney disease stage III Referring Physician: Curt Jews M.D. (vascular surgery)  HPI: 69 year old Caucasian man with past medical history significant for baseline chronic kidney disease stage III (creatinine ranging 1.8-2.1), coronary artery disease status post CABG, hypertension, history of peripheral vascular disease with ongoing tobacco use who underwent an aortobifemoral bypass graft (bifurcated graft) and left renal artery bypass graft on 05/30/14 for a 5.6 cm aortic aneurysm and high-grade left renal artery stenosis. He was transiently hypotensive postop and on pressors that are being weaned off. Currently just on dopamine with acceptable blood pressures and status post 2 unit packed red cell transfusion. Urine output is barely in the nonoliguric range. He is on sodium bicarbonate drip at 50 mL an hour. Was previously on losartan/HCTZ that is currently on hold.   03/31/2014  04/15/2014  05/30/2014  05/31/2014  06/01/2014  06/02/2014   BUN 30 (H) 37 (H) 23 33 (H) 41 (H) 43 (H)  Creatinine 1.83 (H) 2.10 (H) 2.40 (H) 3.65 (H) 4.17 (H) 4.31 (H)    Past Medical History  Diagnosis Date  . CAD (coronary artery disease)     myoview 04/21/11-normal, EF49%  . S/P CABG (coronary artery bypass graft) 1996  . Hyperlipemia   . Polycythemia     H/O  . GERD (gastroesophageal reflux disease)   . Tobacco abuse   . CKD (chronic kidney disease), stage III   . Myocardial infarction 1996  . OSA on CPAP     CPAP q night , CPAP is through [place], states doesn't remember when he had the last study  . Pneumonia     hosp. as a child with pneumonia, not since   . Diabetes     BORDERLINE  . Headache     uses [employer], states he is lessening what he is taking for prep. for surgery   . Arthritis     in his back  . Hypertension     stress test- done 04/2014, followed by Dr. Gwenlyn Found  . Carotid stenosis 01/15/08    right endarterectomy-Dr. Amedeo Plenty  . PAD (peripheral  artery disease) 06/12/09    a. 11/22/07 PTA & stenting right external iliac artery;bilateral iliac & PTI and stenting 1997;right ICA =>50% reduction,right SFA >50%,left ICA at stent => 50% reduction,left EIA => 50% reduction,left ATA occluded, left SFA mid > 49% reduction;  03/2014 Angio: LRA 90, patent L Iliac stent and distal RCIA stent, large saccular AAA.  [name] Abdominal aortic aneurysm 01/28/10; 02/05/14    4.3x4.6cm;  now measuring 5.5 cm    Past Surgical History  Procedure Laterality Date  . Carotid endarterectomy Right 01/15/08  . Lumbar disc surgery  1998  . Pv angio  11/22/2007    PTA/ stenting of right common iliac artery with a 10x52mm Smart stent and postdilated with a 7x35mmpowerflex balloon; high grade calcified stenosis of the RICA  . Colonoscopy N/A 07/09/2013    Procedure: COLONOSCOPY;  Surgeon: Juanita Craver, MD;  Location: [facility];  Service: Endoscopy;  Laterality: N/A;  . Pv angio  04/01/14    AAA, Lt renal artery stenosis, patent iliacs  . Abdominal aortagram N/A 04/01/2014    Procedure: ABDOMINAL Maxcine Ham;  Surgeon: [name], MD;  Location: [facility] CATH LAB;  Service: Cardiovascular;  Laterality: N/A;  . Back surgery  1988    at [facility]  . Coronary artery bypass graft  1996    LIMA to LAD, sequential vein to OM1 and 2 as  well as acure marginal branch and diag branch    Family History  Problem Relation Age of Onset  . Heart failure Father     Social History:  reports that he has been smoking Cigarettes.  He has a 56 pack-year smoking history. He has never used smokeless tobacco. He reports that he does not drink alcohol or use illicit drugs.  Allergies:  Allergies  Allergen Reactions  . Niaspan [Niacin Er] Hives    Medications:  Scheduled: . antiseptic oral rinse  7 mL Mouth Rinse QID  . chlorhexidine  15 mL Mouth Rinse BID  . insulin aspart  0-9 Units Subcutaneous 6 times per day  . pantoprazole (PROTONIX) IV  40 mg Intravenous Q24H    BMET    Component  Value Date/Time   NA 142 06/02/2014 0330   K 3.6 06/02/2014 0330   CL 109 06/02/2014 0330   CO2 26 06/02/2014 0330   GLUCOSE 133* 06/02/2014 0330   BUN 43* 06/02/2014 0330   CREATININE 4.31* 06/02/2014 0330   CREATININE 2.02* 05/03/2014 0829   CALCIUM 6.6* 06/02/2014 0330   GFRNONAA 13* 06/02/2014 0330   GFRAA 15* 06/02/2014 0330    CBC    Component Value Date/Time   WBC 9.4 06/02/2014 0330   RBC 2.21* 06/02/2014 0330   HGB 6.9* 06/02/2014 0330   HCT 20.4* 06/02/2014 0330   PLT 79* 06/02/2014 0330   MCV 92.3 06/02/2014 0330   MCH 31.2 06/02/2014 0330   MCHC 33.8 06/02/2014 0330   RDW 15.6* 06/02/2014 0330   LYMPHSABS 2.1 03/31/2014 1521   MONOABS 0.8 03/31/2014 1521   EOSABS 0.3 03/31/2014 1521   BASOSABS 0.1 03/31/2014 1521      Dg Chest Port 1 View  06/02/2014   CLINICAL DATA:  69 year old male intubated ICU patient.  EXAM: PORTABLE CHEST - 1 VIEW  COMPARISON:  Chest x-ray 06/01/2014.  FINDINGS: An endotracheal tube is in place with tip 6.0 cm above the carina. Right IJ central venous catheter with tip at the superior cavoatrial junction. A nasogastric tube is seen extending into the stomach, however, the tip of the nasogastric tube extends below the lower margin of the image. Lung volumes are low. Cephalization of the pulmonary vasculature with indistinct interstitial markings in lungs bilaterally suggesting a background of mild interstitial pulmonary edema. In addition, there are more focal opacities throughout the right mid to lower lung, concerning for suture post areas of airspace consolidation, potentially from infection. Bibasilar opacities are also present, presumably atelectatic. Small bilateral pleural effusions. Heart size is mildly enlarged. Upper mediastinal contours are within normal limits for the patient's lordotic positioning and portable technique. Extensive atherosclerosis in the thoracic aorta. Status post median sternotomy for CABG, including LIMA.   IMPRESSION: 1. Postoperative changes and support apparatus, as above. 2. Worsening aeration compared to yesterday's examination, concerning for combination of developing pulmonary edema, and potential superimposed infection in the right mid to lower lung. There are also areas of bibasilar subsegmental atelectasis and small bilateral pleural effusions. 3. Atherosclerosis.   Electronically Signed   By: Vinnie Langton M.D.   On: 06/02/2014 09:18   Dg Chest Port 1 View  06/01/2014   CLINICAL DATA:  Central line complication, initial encounter  EXAM: PORTABLE CHEST - 1 VIEW  COMPARISON:  06/01/2014 at 6:44 a.m.  FINDINGS: Moderate cardiac enlargement. Status post CABG. No change endotracheal tube. No change orogastric tube. Right Swan-Ganz central venous catheter has been retracted and now is seen with tip over  the cavoatrial junction.  Vascular congestion identified with persistent right mid lung discoid atelectasis. Mild interstitial prominence suggesting an element of interstitial edema, which represents change from prior study.  IMPRESSION: Retraction of right Swan-Ganz central venous catheter.  Atelectasis right mid lung zone with development of mild interstitial pulmonary edema.   Electronically Signed   By: Skipper Cliche M.D.   On: 06/01/2014 20:59   Dg Chest Port 1 View  06/01/2014   CLINICAL DATA:  Acute respiratory failure and hypoxia. Subsequent encounter.  EXAM: PORTABLE CHEST - 1 VIEW  COMPARISON:  Chest radiograph performed 05/31/2014  FINDINGS: The patient's endotracheal tube is seen ending 5 cm above the carina. An enteric tube is seen extending below the diaphragm. A right IJ Swan-Ganz catheter is seen ending about the right main pulmonary artery.  There has been interval improvement in right upper lobe airspace opacification, reflecting improving pneumonia. No pleural effusion or pneumothorax is seen.  The cardiomediastinal silhouette is mildly enlarged. The patient is status post mid  sternotomy, with evidence of prior CABG. No acute osseous abnormalities are identified.  IMPRESSION: Interval improvement in right upper lobe pneumonia.   Electronically Signed   By: Garald Balding M.D.   On: 06/01/2014 08:24    Review of Systems  Unable to perform ROS: intubated   Blood pressure 117/62, pulse 71, temperature 98.8 F (37.1 C), temperature source Oral, resp. rate 22, height 5\' 7"  (1.702 m), weight 105.5 kg (232 lb 9.4 oz), SpO2 99 %. Physical Exam  Nursing note and vitals reviewed. Constitutional: He appears well-developed and well-nourished.  HENT:  Head: Normocephalic.  Intubated and sedated  Eyes: Pupils are equal, round, and reactive to light.  Neck: Neck supple. JVD present.  7-8 cm JVD  Cardiovascular: Normal rate, regular rhythm and normal heart sounds.   No murmur heard. Respiratory: Effort normal and breath sounds normal.  GI: Soft. Bowel sounds are normal. He exhibits no distension. There is no tenderness.  Neurological:  Sedated  Skin: Skin is warm and dry. No erythema.    Assessment/Plan 1. Acute renal failure on chronic kidney disease stage III: Available database, history and timeline of events points to ischemic ATN postoperatively. He is nonoliguric at present and I will attempt to augment his urine output with a trial of Lasix. No acute dialysis needs noted at this time. Will discontinue sodium bicarbonate drip later today. Volume status slightly hypervolemic but urine electrolytes suggestive of intravascular volume contraction. Continue to limit nephrotoxin exposure such as iodinated contrast and nonsteroidals while strictly monitoring input and output as is currently being done. Apprehensive to order renal ultrasound with recent renal artery bypass. 2. Anemia: Status post packed red cell transfusion iron stores low but ferritin prohibitive to IV iron. We'll start ESA. 3. Status post aortobifemoral bypass grafting and left renal artery bypass: Appears to  be having good perfusion to his lower extremities, will monitor urine output/renal function closely 4. VDRF: Ventilator management per CCM.  Etherine [name] K. 06/02/2014, 12:22 PM

## 2014-06-02 NOTE — Progress Notes (Signed)
Subjective: Interval History: none.. Able on vent. Required restraints  Objective: Vital signs in last 24 hours: Temp:  [98.1 F (36.7 C)-100.6 F (38.1 C)] 98.6 F (37 C) (01/10 0815) Pulse Rate:  [71-88] 73 (01/10 0815) Resp:  [20-23] 22 (01/10 0815) BP: (97-146)/(44-63) 111/60 mmHg (01/10 0800) SpO2:  [94 %-100 %] 98 % (01/10 0815) Arterial Line BP: (116-156)/(44-58) 134/51 mmHg (01/10 0815) FiO2 (%):  [40 %] 40 % (01/10 0900) Weight:  [232 lb 9.4 oz (105.5 kg)] 232 lb 9.4 oz (105.5 kg) (01/10 0700)  Intake/Output from previous day: 01/09 0701 - 01/10 0700 In: 6431.5 [I.V.:5981.5; Blood:330; NG/GT:120] Out: 2725 [Urine:675; Emesis/NG output:2050] Intake/Output this shift: Total I/O In: 441.7 [I.V.:361.7; Blood:50; NG/GT:30] Out: 550 [Urine:50; Emesis/NG output:500]  Abdomen somewhat distended nontender. Dominant groin incisions are healing quite nicely. Does have some lower extremity edema. Easily palpable posterior tibial pulses bilaterally  Lab Results:  Recent Labs  06/01/14 0318 06/02/14 0330  WBC 12.7* 9.4  HGB 8.2* 6.9*  HCT 24.8* 20.4*  PLT 76* 79*   BMET  Recent Labs  06/01/14 1620 06/02/14 0330  NA 141 142  K 4.1 3.6  CL 113* 109  CO2 24 26  GLUCOSE 127* 133*  BUN 41* 43*  CREATININE 4.17* 4.31*  CALCIUM 6.5* 6.6*    Studies/Results: Dg Chest Port 1 View  06/02/2014   CLINICAL DATA:  69 year old male intubated ICU patient.  EXAM: PORTABLE CHEST - 1 VIEW  COMPARISON:  Chest x-ray 06/01/2014.  FINDINGS: An endotracheal tube is in place with tip 6.0 cm above the carina. Right IJ central venous catheter with tip at the superior cavoatrial junction. A nasogastric tube is seen extending into the stomach, however, the tip of the nasogastric tube extends below the lower margin of the image. Lung volumes are low. Cephalization of the pulmonary vasculature with indistinct interstitial markings in lungs bilaterally suggesting a background of mild interstitial  pulmonary edema. In addition, there are more focal opacities throughout the right mid to lower lung, concerning for suture post areas of airspace consolidation, potentially from infection. Bibasilar opacities are also present, presumably atelectatic. Small bilateral pleural effusions. Heart size is mildly enlarged. Upper mediastinal contours are within normal limits for the patient's lordotic positioning and portable technique. Extensive atherosclerosis in the thoracic aorta. Status post median sternotomy for CABG, including LIMA.  IMPRESSION: 1. Postoperative changes and support apparatus, as above. 2. Worsening aeration compared to yesterday's examination, concerning for combination of developing pulmonary edema, and potential superimposed infection in the right mid to lower lung. There are also areas of bibasilar subsegmental atelectasis and small bilateral pleural effusions. 3. Atherosclerosis.   Electronically Signed   By: Vinnie Langton M.D.   On: 06/02/2014 09:18   Dg Chest Port 1 View  06/01/2014   CLINICAL DATA:  Central line complication, initial encounter  EXAM: PORTABLE CHEST - 1 VIEW  COMPARISON:  06/01/2014 at 6:44 a.m.  FINDINGS: Moderate cardiac enlargement. Status post CABG. No change endotracheal tube. No change orogastric tube. Right Swan-Ganz central venous catheter has been retracted and now is seen with tip over the cavoatrial junction.  Vascular congestion identified with persistent right mid lung discoid atelectasis. Mild interstitial prominence suggesting an element of interstitial edema, which represents change from prior study.  IMPRESSION: Retraction of right Swan-Ganz central venous catheter.  Atelectasis right mid lung zone with development of mild interstitial pulmonary edema.   Electronically Signed   By: Skipper Cliche M.D.   On: 06/01/2014 20:59  Dg Chest Port 1 View  06/01/2014   CLINICAL DATA:  Acute respiratory failure and hypoxia. Subsequent encounter.  EXAM: PORTABLE  CHEST - 1 VIEW  COMPARISON:  Chest radiograph performed 05/31/2014  FINDINGS: The patient's endotracheal tube is seen ending 5 cm above the carina. An enteric tube is seen extending below the diaphragm. A right IJ Swan-Ganz catheter is seen ending about the right main pulmonary artery.  There has been interval improvement in right upper lobe airspace opacification, reflecting improving pneumonia. No pleural effusion or pneumothorax is seen.  The cardiomediastinal silhouette is mildly enlarged. The patient is status post mid sternotomy, with evidence of prior CABG. No acute osseous abnormalities are identified.  IMPRESSION: Interval improvement in right upper lobe pneumonia.   Electronically Signed   By: Garald Balding M.D.   On: 06/01/2014 08:24   Dg Chest Port 1 View  05/31/2014   CLINICAL DATA:  Status post aortofemoral bypass graft.  EXAM: PORTABLE CHEST - 1 VIEW  COMPARISON:  May 30, 2014.  FINDINGS: Status post coronary artery bypass graft. Endotracheal and nasogastric tubes are unchanged in position. Right internal jugular Swan-Ganz catheter is noted with tip directed into the right pulmonary artery. Stable right upper lobe opacity is noted most consistent with right upper lobe atelectasis or pneumonia. No pneumothorax or significant pleural effusion is noted.  IMPRESSION: Stable support apparatus. Stable right upper lobe opacity is noted most consistent with pneumonia or atelectasis of the right upper lobe.   Electronically Signed   By: Sabino Dick M.D.   On: 05/31/2014 08:04   Dg Chest Port 1 View  05/30/2014   CLINICAL DATA:  Evaluate position of Swan-Ganz catheter after adjustment.  EXAM: PORTABLE CHEST - 1 VIEW  COMPARISON:  05/30/2014  FINDINGS: Postoperative changes in the mediastinum. Endotracheal tube tip measures 4.7 cm above the carinal. Enteric tube tip is off the field of view but below the left hemidiaphragm. Swan-Ganz catheter tip projects over the right main pulmonary artery. Interval  development of collapse or consolidation of the right upper lung. No blunting of costophrenic angles. Left lung is clear. Heart size and pulmonary vascularity are normal. Calcified and tortuous aorta.  IMPRESSION: Appliances appear to be in satisfactory position. Interval development of collapse or consolidation of the right upper lung.   Electronically Signed   By: Lucienne Capers M.D.   On: 05/30/2014 21:23   Portable Chest Xray  05/30/2014   CLINICAL DATA:  Status post aortic aneurysm repair. Respiratory failure.  EXAM: PORTABLE CHEST - 1 VIEW  COMPARISON:  Chest CT 03/05/2014  FINDINGS: Endotracheal tube is 6.4 cm from the carina. Enteric tube in place, tip below the diaphragm not included in the field of view. Right internal jugular Swan-Ganz catheter tip in the region of the pulmonary outflow tract. Patient is post median sternotomy. The heart size is normal. No definite chest tubes are seen. There is no pleural effusion or pneumothorax. Minimal vascular congestion. No pulmonary edema.  IMPRESSION: 1. Endotracheal tube, enteric tube, and Swan-Ganz catheter in place. 2. Minimal vascular congestion.   Electronically Signed   By: Jeb Levering M.D.   On: 05/30/2014 18:09   Anti-infectives: Anti-infectives    Start     Dose/Rate Route Frequency Ordered Stop   05/31/14 0100  cefUROXime (ZINACEF) 1.5 g in dextrose 5 % 50 mL IVPB     1.5 g100 mL/hr over 30 Minutes Intravenous Every 12 hours 05/30/14 1732 05/31/14 1235   05/30/14 1330  cefUROXime (ZINACEF) 1.5  g in dextrose 5 % 50 mL IVPB  Status:  Discontinued     1.5 g100 mL/hr over 30 Minutes Intravenous To Surgery 05/30/14 1326 05/30/14 1716   05/29/14 1455  cefUROXime (ZINACEF) 1.5 g in dextrose 5 % 50 mL IVPB     1.5 g100 mL/hr over 30 Minutes Intravenous 30 min pre-op 05/29/14 1455 05/30/14 1343      Assessment/Plan: s/p Procedure(s): AORTOBIFEMORAL BYPASS GRAFT (N/A) LEFT RENAL ARTERY BYPASS (Left) hemoglobin and hematocrit dropped over  the night. Was given 2 units of packed red blood cells. Remains hemodynamically stable. Creatinine at 4 with some decreased urine output. Will ask renal for formal consult as well.   LOS: 3 days   EARLY, TODD 06/02/2014, 9:29 AM

## 2014-06-02 NOTE — Progress Notes (Signed)
MD notified off patient converting to afib at a controlled rate 70-80. Slight drop in BP. Ordered to wean off dopamine. Will continue to monitor. Richardean Sale, RN

## 2014-06-03 ENCOUNTER — Encounter (HOSPITAL_COMMUNITY): Payer: Self-pay | Admitting: Surgery

## 2014-06-03 ENCOUNTER — Inpatient Hospital Stay (HOSPITAL_COMMUNITY): Payer: Medicare Other

## 2014-06-03 DIAGNOSIS — N179 Acute kidney failure, unspecified: Secondary | ICD-10-CM | POA: Diagnosis not present

## 2014-06-03 LAB — TYPE AND SCREEN
ABO/RH(D): A POS
Antibody Screen: NEGATIVE
Unit division: 0
Unit division: 0
Unit division: 0

## 2014-06-03 LAB — GLUCOSE, CAPILLARY
Glucose-Capillary: 115 mg/dL — ABNORMAL HIGH (ref 70–99)
Glucose-Capillary: 117 mg/dL — ABNORMAL HIGH (ref 70–99)
Glucose-Capillary: 83 mg/dL (ref 70–99)
Glucose-Capillary: 87 mg/dL (ref 70–99)
Glucose-Capillary: 88 mg/dL (ref 70–99)
Glucose-Capillary: 88 mg/dL (ref 70–99)
Glucose-Capillary: 89 mg/dL (ref 70–99)
Glucose-Capillary: 94 mg/dL (ref 70–99)

## 2014-06-03 LAB — RENAL FUNCTION PANEL
Albumin: 1.6 g/dL — ABNORMAL LOW (ref 3.5–5.2)
Anion gap: 10 (ref 5–15)
BUN: 51 mg/dL — ABNORMAL HIGH (ref 6–23)
CO2: 23 mmol/L (ref 19–32)
Calcium: 6.9 mg/dL — ABNORMAL LOW (ref 8.4–10.5)
Chloride: 108 mEq/L (ref 96–112)
Creatinine, Ser: 5.17 mg/dL — ABNORMAL HIGH (ref 0.50–1.35)
GFR calc Af Amer: 12 mL/min — ABNORMAL LOW (ref >90)
GFR calc non Af Amer: 10 mL/min — ABNORMAL LOW (ref >90)
Glucose, Bld: 101 mg/dL — ABNORMAL HIGH (ref 70–99)
Phosphorus: 4.8 mg/dL — ABNORMAL HIGH (ref 2.3–4.6)
Potassium: 3.3 mmol/L — ABNORMAL LOW (ref 3.5–5.1)
Sodium: 141 mmol/L (ref 135–145)

## 2014-06-03 LAB — CBC
HCT: 22.1 % — ABNORMAL LOW (ref 39.0–52.0)
Hemoglobin: 7.8 g/dL — ABNORMAL LOW (ref 13.0–17.0)
MCH: 31.6 pg (ref 26.0–34.0)
MCHC: 35.3 g/dL (ref 30.0–36.0)
MCV: 89.5 fL (ref 78.0–100.0)
Platelets: 97 10*3/uL — ABNORMAL LOW (ref 150–400)
RBC: 2.47 MIL/uL — ABNORMAL LOW (ref 4.22–5.81)
RDW: 16.7 % — ABNORMAL HIGH (ref 11.5–15.5)
WBC: 8.3 10*3/uL (ref 4.0–10.5)

## 2014-06-03 LAB — POCT I-STAT 3, ART BLOOD GAS (G3+)
Acid-base deficit: 1 mmol/L (ref 0.0–2.0)
Bicarbonate: 23.2 mEq/L (ref 20.0–24.0)
O2 Saturation: 96 %
Patient temperature: 99
TCO2: 24 mmol/L (ref 0–100)
pCO2 arterial: 35.5 mmHg (ref 35.0–45.0)
pH, Arterial: 7.423 (ref 7.350–7.450)
pO2, Arterial: 80 mmHg (ref 80.0–100.0)

## 2014-06-03 LAB — PREPARE RBC (CROSSMATCH)

## 2014-06-03 MED ORDER — SODIUM CHLORIDE 0.9 % IV SOLN: Freq: Once | INTRAVENOUS | Status: DC

## 2014-06-03 MED ORDER — DEXTROSE 5 % IV SOLN
120.0000 mg | Freq: Once | INTRAVENOUS | Status: AC
  Administered 2014-06-03: 120 mg via INTRAVENOUS
  Filled 2014-06-03: qty 12

## 2014-06-03 MED ORDER — PHENYLEPHRINE HCL 10 MG/ML IJ SOLN
30.0000 ug/min | INTRAMUSCULAR | Status: DC
  Administered 2014-06-03: 10 ug/min via INTRAVENOUS
  Filled 2014-06-03: qty 1

## 2014-06-03 NOTE — Progress Notes (Signed)
PULMONARY / CRITICAL CARE MEDICINE   Name: Barry Lawrence MRN: [id number] DOB: 1945/08/11    ADMISSION DATE:  05/30/2014 CONSULTATION DATE:  05/30/2014  REFERRING MD :  [name]   CHIEF COMPLAINT:  Vent management   INITIAL PRESENTATION:  70 yo male smoker for repair of AAA.  Remained on vent post-op.   PMHx of CAD s/p CABG, CKD III, OSA on CPAP, DM, HTN, mild COPD.  STUDIES:  None  SIGNIFICANT EVENTS: 1/07 OR>> elective repair AAA 1/10 transfuse 2 units PRBC, renal consulted  SUBJECTIVE:  Weaning pressors.  Urine outpt improved, but CXR looks wet and more peripheral edema.  VITAL SIGNS: Temp:  [98.2 F (36.8 C)-100.3 F (37.9 C)] 98.2 F (36.8 C) (01/11 0816) Pulse Rate:  [66-88] 73 (01/11 1000) Resp:  [21-22] 22 (01/11 1000) BP: (87-153)/(41-68) 113/58 mmHg (01/11 1000) SpO2:  [96 %-100 %] 98 % (01/11 1000) Arterial Line BP: (95-158)/(44-63) 151/58 mmHg (01/11 1000) FiO2 (%):  [40 %] 40 % (01/11 0838) Weight:  [105.6 kg (232 lb 12.9 oz)] 105.6 kg (232 lb 12.9 oz) (01/11 0500) HEMODYNAMICS: CVP:  [8 mmHg-12 mmHg] 10 mmHg VENTILATOR SETTINGS: Vent Mode:  [-] PRVC FiO2 (%):  [40 %] 40 % Set Rate:  [22 bmp] 22 bmp Vt Set:  [530 mL] 530 mL PEEP:  [5 cmH20] 5 cmH20 Plateau Pressure:  [24 cmH20-29 cmH20] 29 cmH20 INTAKE / OUTPUT:  Intake/Output Summary (Last 24 hours) at 06/03/14 1127 Last data filed at 06/03/14 1000  Gross per 24 hour  Intake 2129.6 ml  Output   1750 ml  Net  379.6 ml    PHYSICAL EXAMINATION: General: no distress Neuro: RASS -2 HEENT: ETT in place Cardiovascular: regular Lungs: no wheeze Abdomen: soft, wound site clean Musculoskeletal: 1+ edema Skin: no rashes  LABS:  CBC  Recent Labs Lab 06/01/14 0318 06/02/14 0330 06/03/14 0345  WBC 12.7* 9.4 8.3  HGB 8.2* 6.9* 7.8*  HCT 24.8* 20.4* 22.1*  PLT 76* 79* 97*     Coag's  Recent Labs Lab 05/30/14 1733 05/30/14 2040 05/31/14 0430  APTT 39* 36 39*  INR 1.58* 1.42 1.46    BMET  Recent Labs Lab 06/01/14 1620 06/02/14 0330 06/03/14 0345  NA 141 142 141  K 4.1 3.6 3.3*  CL 113* 109 108  CO2 24 26 23   BUN 41* 43* 51*  CREATININE 4.17* 4.31* 5.17*  GLUCOSE 127* 133* 101*   Electrolytes  Recent Labs Lab 05/30/14 1733  05/31/14 0430  06/01/14 0318 06/01/14 1620 06/02/14 0330 06/03/14 0345  CALCIUM 7.2*  < > 6.8*  < > 6.5* 6.5* 6.6* 6.9*  MG 1.5  --  1.8  --  1.9  --   --   --   PHOS  --   --   --   --  5.2*  --   --  4.8*  < > = values in this interval not displayed.   Sepsis Markers  Recent Labs Lab 05/30/14 1744  LATICACIDVEN 2.6*   ABG  Recent Labs Lab 06/01/14 1624 06/02/14 0338 06/03/14 0350  PHART 7.271* 7.356 7.423  PCO2ART 44.1 42.9 35.5  PO2ART 78.0* 83.0 80.0   Liver Enzymes  Recent Labs Lab 05/31/14 0430 06/01/14 0318 06/03/14 0345  AST 139* 211*  --   ALT 45 71*  --   ALKPHOS 26* 29*  --   BILITOT 1.0 0.7  --   ALBUMIN 2.8* 2.3* 1.6*    Glucose  Recent Labs Lab 06/02/14 1251  06/02/14 1628 06/02/14 1939 06/02/14 2355 06/03/14 0352 06/03/14 0812  GLUCAP 85 89 85 93 88 83    Imaging Dg Chest Port 1 View  06/02/2014   CLINICAL DATA:  69 year old male intubated ICU patient.  EXAM: PORTABLE CHEST - 1 VIEW  COMPARISON:  Chest x-ray 06/01/2014.  FINDINGS: An endotracheal tube is in place with tip 6.0 cm above the carina. Right IJ central venous catheter with tip at the superior cavoatrial junction. A nasogastric tube is seen extending into the stomach, however, the tip of the nasogastric tube extends below the lower margin of the image. Lung volumes are low. Cephalization of the pulmonary vasculature with indistinct interstitial markings in lungs bilaterally suggesting a background of mild interstitial pulmonary edema. In addition, there are more focal opacities throughout the right mid to lower lung, concerning for suture post areas of airspace consolidation, potentially from infection. Bibasilar opacities  are also present, presumably atelectatic. Small bilateral pleural effusions. Heart size is mildly enlarged. Upper mediastinal contours are within normal limits for the patient's lordotic positioning and portable technique. Extensive atherosclerosis in the thoracic aorta. Status post median sternotomy for CABG, including LIMA.  IMPRESSION: 1. Postoperative changes and support apparatus, as above. 2. Worsening aeration compared to yesterday's examination, concerning for combination of developing pulmonary edema, and potential superimposed infection in the right mid to lower lung. There are also areas of bibasilar subsegmental atelectasis and small bilateral pleural effusions. 3. Atherosclerosis.   Electronically Signed   By: Vinnie Langton M.D.   On: 06/02/2014 09:18     ASSESSMENT / PLAN:  PULMONARY OETT 01/07 >>> A: Acute respiratory failure with hypoxia following surgery for AAA repair. Developing pulmonary edema 1/10. CXR improved some on 1/11 Hx of OSA, COPD. P:   Continue full vent support Fio2 to keep Sao2 > 92% F/u CXR, ABG PRN BD's  CARDIOVASCULAR L radial a line 01/07 >>> R Swan 01/07 >>> 1/09 Rt IJ CVL through cordis 1/09 >> A:  Hypovolemic/hemorrhagic shock after AAA repair. Hx of CAD s/p CABG, HTN, HLD, PAD. P:  Dopamine weaned to off, phenylephrine started, currently low dose and weaing Goal CVP 8 to 12 Post-op care per [facility] Hold outpatient amlodipine, atorvastatin, carvedilol, ezetimibe, fenofibrate, hyzaar   RENAL A:   AKI in setting of hypovolemic/hemorrhagic shock >> Urine Na 19, Urine creatinine 160 from 1/09. Non anion gap metabolic acidosis >> improved. CKD III >> baseline creatinine 1.8 to 2.1. P:   Monitor renal fx, urine outpt Nephrology consulted 1/10 >> may be approaching HD, no urgent indication but UOP has dropped despite increased lasix 1/10 pm. His wife tells me that he would not want Barry-long HD, but that we should consider short term  HD  GASTROINTESTINAL A:   Nutrition. Hx of GERD. P:   TF's running Protonix for SUP  HEMATOLOGIC A:  Acute blood loss anemia. Thrombocytopenia >> PLT 207 from 05/22/14. P: F/u CBC Transfuse for Hb < 7 SCD for DVT prevention  INFECTIOUS A:   Surgical prophylaxis >> off Abx 1/08. P:   Monitor off Abx Check sputum cx 1/10  ENDOCRINE A:   No acute issues. P:   Monitor blood sugars  NEUROLOGIC A:   Acute metabolic encephalopathy. P:   RASS goal -1 Wean off versed gtt as tolerated Continue fentanyl gtt  Inter-disciplinary family meet or Palliative Care meeting due by:  1/24   CC time 40 minutes.  Baltazar Apo, MD, PhD 06/03/2014, 11:55 AM [facility] [phone]  or if no answer [phone]

## 2014-06-03 NOTE — Progress Notes (Signed)
    Subjective  - POD #4, status post aortobifemoral bypass graft for an abdominal aortic aneurysm.  Also status post left renal artery bypass  Patient remains intubated and sedated   Physical Exam:  Abdomen remains distended but soft without evidence of compartment syndrome. Extremities are warm and perfused with good posterior tibial Doppler signals       Assessment/Plan:  POD #4  CV: The patient is in atrial fibrillation which is rate controlled.  He is now off dopamine.  Phenylephrine is being weaned.  His blood pressure remained stable Pulmonary: Remains intubated: That management per CCM Renal: Creatinine continues to rise.  Urine output is decreasing.  He did get Lasix yesterday.  Appreciate nephrology assistance. GI: Remains nothing by mouth.  We'll consider trophic tube feeds Heme: Hematocrit is down today.  Will need transfusion of 2 units of blood ID: No active issues Prophylaxis: SCDs  Sherryl Valido IV, V. WELLS 06/03/2014 9:28 AM --  Filed Vitals:   06/03/14 0900  BP: 110/62  Pulse: 72  Temp:   Resp: 22    Intake/Output Summary (Last 24 hours) at 06/03/14 0928 Last data filed at 06/03/14 0900  Gross per 24 hour  Intake 2707.1 ml  Output   1860 ml  Net  847.1 ml     Laboratory CBC    Component Value Date/Time   WBC 8.3 06/03/2014 0345   HGB 7.8* 06/03/2014 0345   HCT 22.1* 06/03/2014 0345   PLT 97* 06/03/2014 0345    BMET    Component Value Date/Time   NA 141 06/03/2014 0345   K 3.3* 06/03/2014 0345   CL 108 06/03/2014 0345   CO2 23 06/03/2014 0345   GLUCOSE 101* 06/03/2014 0345   BUN 51* 06/03/2014 0345   CREATININE 5.17* 06/03/2014 0345   CREATININE 2.02* 05/03/2014 0829   CALCIUM 6.9* 06/03/2014 0345   GFRNONAA 10* 06/03/2014 0345   GFRAA 12* 06/03/2014 0345    COAG Lab Results  Component Value Date   INR 1.46 05/31/2014   INR 1.42 05/30/2014   INR 1.58* 05/30/2014   No results found for: PTT  Antibiotics Anti-infectives    Start     Dose/Rate Route Frequency Ordered Stop   05/31/14 0100  cefUROXime (ZINACEF) 1.5 g in dextrose 5 % 50 mL IVPB     1.5 g100 mL/hr over 30 Minutes Intravenous Every 12 hours 05/30/14 1732 05/31/14 1235   05/30/14 1330  cefUROXime (ZINACEF) 1.5 g in dextrose 5 % 50 mL IVPB  Status:  Discontinued     1.5 g100 mL/hr over 30 Minutes Intravenous To Surgery 05/30/14 1326 05/30/14 1716   05/29/14 1455  cefUROXime (ZINACEF) 1.5 g in dextrose 5 % 50 mL IVPB     1.5 g100 mL/hr over 30 Minutes Intravenous 30 min pre-op 05/29/14 1455 05/30/14 1343       V. Leia Alf, M.D. [facility] Office: [phone] Pager:  [phone]

## 2014-06-03 NOTE — Progress Notes (Signed)
[facility] Progress Note Patient Name: KWANE AMELIO DOB: 1945/07/07 MRN: [id number]   Date of Service  06/03/2014  HPI/Events of Note  Limited response to 40 lasix. crt almost 5, large muslce mass, cvp up and all notes point to hypervolemia  eICU Interventions  Lasix 120 x1 Neo if drops BP     Intervention Category Intermediate Interventions: Hypervolemia - evaluation and management  Seairra Otani J. 06/03/2014, 1:09 AM

## 2014-06-03 NOTE — Progress Notes (Signed)
[facility] ROUNDING NOTE   Subjective:   Interval History: no change still poor urine output this morning  Objective:  Vital signs in last 24 hours:  Temp:  [98.2 F (36.8 C)-100.3 F (37.9 C)] 98.2 F (36.8 C) (01/11 0816) Pulse Rate:  [66-88] 72 (01/11 0900) Resp:  [21-22] 22 (01/11 0900) BP: (87-153)/(41-68) 110/62 mmHg (01/11 0900) SpO2:  [96 %-100 %] 100 % (01/11 0900) Arterial Line BP: (95-158)/(44-63) 148/57 mmHg (01/11 0900) FiO2 (%):  [40 %] 40 % (01/11 0838) Weight:  [105.6 kg (232 lb 12.9 oz)] 105.6 kg (232 lb 12.9 oz) (01/11 0500)  Weight change: 0.1 kg (3.5 oz) Filed Weights   06/01/14 0400 06/02/14 0700 06/03/14 0500  Weight: 99 kg (218 lb 4.1 oz) 105.5 kg (232 lb 9.4 oz) 105.6 kg (232 lb 12.9 oz)    Intake/Output: I/O last 3 completed shifts: In: 5897.6 [I.V.:4820.6; Blood:715; NG/GT:300; IV Piggyback:62] Out: [id number] [Urine:1035; Emesis/NG output:2650]   Intake/Output this shift:  Total I/O In: 85 [I.V.:85] Out: 95 [Urine:95]  CVS- RRR RS- CTA  Intubated  And sedated  ABD- BS present soft non-distended EXT- no edema   Basic Metabolic Panel:  Recent Labs Lab 05/30/14 1733  05/31/14 0430  05/31/14 1550 06/01/14 0318 06/01/14 1620 06/02/14 0330 06/03/14 0345  NA 139  < > 138  < > 140 140 141 142 141  K 5.1  < > 4.0  < > 4.6 4.3 4.1 3.6 3.3*  CL 108  < > 110  < > 109 110 113* 109 108  CO2 23  < > 21  < > 20 18* 24 26 23   GLUCOSE 178*  < > 138*  < > 132* 123* 127* 133* 101*  BUN 23  < > 27*  < > 33* 38* 41* 43* 51*  CREATININE 2.40*  < > 2.95*  < > 3.65* 4.00* 4.17* 4.31* 5.17*  CALCIUM 7.2*  < > 6.8*  < > 6.6* 6.5* 6.5* 6.6* 6.9*  MG 1.5  --  1.8  --   --  1.9  --   --   --   PHOS  --   --   --   --   --  5.2*  --   --  4.8*  < > = values in this interval not displayed.  Liver Function Tests:  Recent Labs Lab 05/31/14 0430 06/01/14 0318 06/03/14 0345  AST 139* 211*  --   ALT 45 71*  --   ALKPHOS 26* 29*  --   BILITOT  1.0 0.7  --   PROT 4.2* 4.2*  --   ALBUMIN 2.8* 2.3* 1.6*    Recent Labs Lab 05/31/14 0430  AMYLASE 59   No results for input(s): AMMONIA in the last 168 hours.  CBC:  Recent Labs Lab 05/31/14 0430 05/31/14 0810 05/31/14 1550 06/01/14 0318 06/02/14 0330 06/03/14 0345  WBC 10.8*  --  15.6* 12.7* 9.4 8.3  HGB 9.6* 9.3* 9.0* 8.2* 6.9* 7.8*  HCT 27.7* 27.0* 27.0* 24.8* 20.4* 22.1*  MCV 89.9  --  90.3 91.2 92.3 89.5  PLT 72*  --  83* 76* 79* 97*    Cardiac Enzymes:  Recent Labs Lab 05/30/14 1744  CKTOTAL 328*    BNP: Invalid input(s): POCBNP  CBG:  Recent Labs Lab 06/02/14 1628 06/02/14 1939 06/02/14 2355 06/03/14 0352 06/03/14 0812  GLUCAP 89 85 93 88 83    Microbiology: Results for orders placed or performed during the hospital encounter  of 05/22/14  Surgical pcr screen     Status: None   Collection Time: 05/22/14 10:05 AM  Result Value Ref Range Status   MRSA, PCR NEGATIVE NEGATIVE Final   Staphylococcus aureus NEGATIVE NEGATIVE Final    Comment:        The Xpert SA Assay (FDA approved for NASAL specimens in patients over 55 years of age), is one component of a comprehensive surveillance program.  Test performance has been validated by [employer] for patients greater than or equal to 59 year old. It is not intended to diagnose infection nor to guide or monitor treatment.     Coagulation Studies: No results for input(s): LABPROT, INR in the last 72 hours.  Urinalysis: No results for input(s): COLORURINE, LABSPEC, PHURINE, GLUCOSEU, HGBUR, BILIRUBINUR, KETONESUR, PROTEINUR, UROBILINOGEN, NITRITE, LEUKOCYTESUR in the last 72 hours.  Invalid input(s): APPERANCEUR    Imaging: Dg Chest Port 1 View  06/03/2014   CLINICAL DATA:  CHF.  EXAM: PORTABLE CHEST - 1 VIEW  COMPARISON:  06/02/2014.  FINDINGS: Tracheostomy tube, right IJ line, NG tube in stable position. Stable cardiomegaly. Prior CABG. Interim partial clearing of pulmonary  interstitial edema. Persistent small left pleural effusion. No pneumothorax.  IMPRESSION: 1. Lines and tubes in stable position. 2. Persistent cardiomegaly. Prior CABG. Interim partial clearing of pulmonary interstitial edema. Persistent small left pleural effusion .   Electronically Signed   By: Marcello Moores  Register   On: 06/03/2014 07:55   Dg Chest Port 1 View  06/02/2014   CLINICAL DATA:  69 year old male intubated ICU patient.  EXAM: PORTABLE CHEST - 1 VIEW  COMPARISON:  Chest x-ray 06/01/2014.  FINDINGS: An endotracheal tube is in place with tip 6.0 cm above the carina. Right IJ central venous catheter with tip at the superior cavoatrial junction. A nasogastric tube is seen extending into the stomach, however, the tip of the nasogastric tube extends below the lower margin of the image. Lung volumes are low. Cephalization of the pulmonary vasculature with indistinct interstitial markings in lungs bilaterally suggesting a background of mild interstitial pulmonary edema. In addition, there are more focal opacities throughout the right mid to lower lung, concerning for suture post areas of airspace consolidation, potentially from infection. Bibasilar opacities are also present, presumably atelectatic. Small bilateral pleural effusions. Heart size is mildly enlarged. Upper mediastinal contours are within normal limits for the patient's lordotic positioning and portable technique. Extensive atherosclerosis in the thoracic aorta. Status post median sternotomy for CABG, including LIMA.  IMPRESSION: 1. Postoperative changes and support apparatus, as above. 2. Worsening aeration compared to yesterday's examination, concerning for combination of developing pulmonary edema, and potential superimposed infection in the right mid to lower lung. There are also areas of bibasilar subsegmental atelectasis and small bilateral pleural effusions. 3. Atherosclerosis.   Electronically Signed   By: Vinnie Langton M.D.   On:  06/02/2014 09:18   Dg Chest Port 1 View  06/01/2014   CLINICAL DATA:  Central line complication, initial encounter  EXAM: PORTABLE CHEST - 1 VIEW  COMPARISON:  06/01/2014 at 6:44 a.m.  FINDINGS: Moderate cardiac enlargement. Status post CABG. No change endotracheal tube. No change orogastric tube. Right Swan-Ganz central venous catheter has been retracted and now is seen with tip over the cavoatrial junction.  Vascular congestion identified with persistent right mid lung discoid atelectasis. Mild interstitial prominence suggesting an element of interstitial edema, which represents change from prior study.  IMPRESSION: Retraction of right Swan-Ganz central venous catheter.  Atelectasis right mid  lung zone with development of mild interstitial pulmonary edema.   Electronically Signed   By: Skipper Cliche M.D.   On: 06/01/2014 20:59     Medications:   . sodium chloride 10 mL/hr at 06/03/14 0900  . DOPamine Stopped (06/02/14 1600)  . fentaNYL infusion INTRAVENOUS 150 mcg/hr (06/03/14 0900)  . midazolam (VERSED) infusion 2.5 mg/hr (06/03/14 0900)  . phenylephrine (NEO-SYNEPHRINE) Adult infusion 10 mcg/min (06/03/14 0900)   . antiseptic oral rinse  7 mL Mouth Rinse QID  . chlorhexidine  15 mL Mouth Rinse BID  . insulin aspart  0-9 Units Subcutaneous 6 times per day  . pantoprazole (PROTONIX) IV  40 mg Intravenous Q24H   acetaminophen **OR** acetaminophen, albuterol, fentaNYL, ondansetron  Assessment/ Plan:  1. Acute renal failure on chronic kidney disease stage III: Available database, history and timeline of events points to ischemic ATN postoperativelyContinue to limit nephrotoxin exposure such as iodinated contrast and nonsteroidals .  2. Anemia: Status post packed red cell transfusion iron stores low but ferritin prohibitive to IV iron. We'll start ESA. 3. Status post aortobifemoral bypass grafting and left renal artery bypass: Appears to be having good perfusion to his lower extremities, will  monitor urine output/renal function closely 4. VDRF: Ventilator management per CCM.    LOS: 4 Sadiya Durand W @TODAY @9 :16 AM

## 2014-06-03 NOTE — Progress Notes (Signed)
UR Completed.  336 [id number] 06/03/2014

## 2014-06-04 ENCOUNTER — Inpatient Hospital Stay (HOSPITAL_COMMUNITY): Payer: Medicare Other

## 2014-06-04 LAB — GLUCOSE, CAPILLARY
Glucose-Capillary: 81 mg/dL (ref 70–99)
Glucose-Capillary: 84 mg/dL (ref 70–99)
Glucose-Capillary: 76 mg/dL (ref 70–99)
Glucose-Capillary: 87 mg/dL (ref 70–99)
Glucose-Capillary: 85 mg/dL (ref 70–99)

## 2014-06-04 LAB — CULTURE, RESPIRATORY W GRAM STAIN

## 2014-06-04 LAB — CBC
HCT: 29.2 % — ABNORMAL LOW (ref 39.0–52.0)
Hemoglobin: 9.8 g/dL — ABNORMAL LOW (ref 13.0–17.0)
MCH: 29.3 pg (ref 26.0–34.0)
MCHC: 33.6 g/dL (ref 30.0–36.0)
MCV: 87.4 fL (ref 78.0–100.0)
Platelets: 133 10*3/uL — ABNORMAL LOW (ref 150–400)
RBC: 3.34 MIL/uL — ABNORMAL LOW (ref 4.22–5.81)
RDW: 15.9 % — ABNORMAL HIGH (ref 11.5–15.5)
WBC: 9.5 10*3/uL (ref 4.0–10.5)

## 2014-06-04 LAB — TYPE AND SCREEN
ABO/RH(D): A POS
Antibody Screen: NEGATIVE
Unit division: 0
Unit division: 0

## 2014-06-04 LAB — RENAL FUNCTION PANEL
Albumin: 1.8 g/dL — ABNORMAL LOW (ref 3.5–5.2)
Anion gap: 8 (ref 5–15)
BUN: 65 mg/dL — ABNORMAL HIGH (ref 6–23)
CO2: 24 mmol/L (ref 19–32)
Calcium: 7.7 mg/dL — ABNORMAL LOW (ref 8.4–10.5)
Chloride: 112 mEq/L (ref 96–112)
Creatinine, Ser: 5.99 mg/dL — ABNORMAL HIGH (ref 0.50–1.35)
GFR calc Af Amer: 10 mL/min — ABNORMAL LOW (ref >90)
GFR calc non Af Amer: 9 mL/min — ABNORMAL LOW (ref >90)
Glucose, Bld: 95 mg/dL (ref 70–99)
Phosphorus: 5.2 mg/dL — ABNORMAL HIGH (ref 2.3–4.6)
Potassium: 3.3 mmol/L — ABNORMAL LOW (ref 3.5–5.1)
Sodium: 144 mmol/L (ref 135–145)

## 2014-06-04 LAB — PROTIME-INR
INR: 1.21 (ref 0.00–1.49)
Prothrombin Time: 15.4 seconds — ABNORMAL HIGH (ref 11.6–15.2)

## 2014-06-04 LAB — CULTURE, RESPIRATORY: Culture: NO GROWTH

## 2014-06-04 MED ORDER — MIDAZOLAM HCL 2 MG/2ML IJ SOLN
1.0000 mg | INTRAMUSCULAR | Status: DC | PRN
  Administered 2014-06-04 – 2014-06-06 (×5): 1 mg via INTRAVENOUS
  Filled 2014-06-04 (×7): qty 2

## 2014-06-04 MED ORDER — POTASSIUM CHLORIDE 10 MEQ/50ML IV SOLN
10.0000 meq | INTRAVENOUS | Status: AC
  Administered 2014-06-04 (×4): 10 meq via INTRAVENOUS
  Filled 2014-06-04 (×3): qty 50

## 2014-06-04 MED ORDER — MIDAZOLAM HCL 2 MG/2ML IJ SOLN
1.0000 mg | INTRAMUSCULAR | Status: AC | PRN
  Administered 2014-06-04: 1 mg via INTRAVENOUS
  Administered 2014-06-05 (×2): 2 mg via INTRAVENOUS
  Filled 2014-06-04 (×2): qty 2

## 2014-06-04 MED ORDER — VITAL HIGH PROTEIN PO LIQD
1000.0000 mL | ORAL | Status: DC
  Administered 2014-06-04 – 2014-06-05 (×2): 1000 mL
  Administered 2014-06-05 (×2)
  Administered 2014-06-06 – 2014-06-07 (×2): 1000 mL
  Administered 2014-06-08 – 2014-06-09 (×4)
  Administered 2014-06-09: 1000 mL
  Administered 2014-06-09 (×3)
  Administered 2014-06-10 – 2014-06-12 (×3): 1000 mL
  Filled 2014-06-04 (×12): qty 1000

## 2014-06-04 MED ORDER — HYDRALAZINE HCL 20 MG/ML IJ SOLN
10.0000 mg | INTRAMUSCULAR | Status: DC | PRN
  Administered 2014-06-19 – 2014-06-24 (×10): 10 mg via INTRAVENOUS
  Filled 2014-06-04 (×11): qty 1

## 2014-06-04 MED ORDER — ENOXAPARIN SODIUM 30 MG/0.3ML ~~LOC~~ SOLN
30.0000 mg | Freq: Every day | SUBCUTANEOUS | Status: DC
  Administered 2014-06-04 – 2014-06-08 (×5): 30 mg via SUBCUTANEOUS
  Filled 2014-06-04 (×6): qty 0.3

## 2014-06-04 NOTE — Progress Notes (Signed)
[facility] ROUNDING NOTE   Subjective:   Interval History: no change this morning still monitoring urine output  Objective:  Vital signs in last 24 hours:  Temp:  [98.6 F (37 C)-100.1 F (37.8 C)] 98.8 F (37.1 C) (01/12 0700) Pulse Rate:  [58-92] 73 (01/12 1000) Resp:  [17-26] 22 (01/12 1000) BP: (98-145)/(42-83) 135/73 mmHg (01/12 1000) SpO2:  [95 %-100 %] 96 % (01/12 1000) Arterial Line BP: (115-170)/(44-75) 167/75 mmHg (01/12 1000) FiO2 (%):  [40 %] 40 % (01/12 0810) Weight:  [108.2 kg (238 lb 8.6 oz)] 108.2 kg (238 lb 8.6 oz) (01/12 0500)  Weight change: 2.6 kg (5 lb 11.7 oz) Filed Weights   06/02/14 0700 06/03/14 0500 06/04/14 0500  Weight: 105.5 kg (232 lb 9.4 oz) 105.6 kg (232 lb 12.9 oz) 108.2 kg (238 lb 8.6 oz)    Intake/Output: I/O last 3 completed shifts: In: 3199.5 [I.V.:2147.5; Blood:670; NG/GT:270; IV Piggyback:112] Out: [id number] [Urine:1010; Emesis/NG output:2150]   Intake/Output this shift:  Total I/O In: 150 [I.V.:100; IV Piggyback:50] Out: 33 [Urine:50]  CVS- RRR RS- CTA Intubated And sedated  ABD- BS present soft non-distended EXT- no edema   Basic Metabolic Panel:  Recent Labs Lab 05/30/14 1733  05/31/14 0430  06/01/14 0318 06/01/14 1620 06/02/14 0330 06/03/14 0345 06/04/14 0459  NA 139  < > 138  < > 140 141 142 141 144  K 5.1  < > 4.0  < > 4.3 4.1 3.6 3.3* 3.3*  CL 108  < > 110  < > 110 113* 109 108 112  CO2 23  < > 21  < > 18* 24 26 23 24   GLUCOSE 178*  < > 138*  < > 123* 127* 133* 101* 95  BUN 23  < > 27*  < > 38* 41* 43* 51* 65*  CREATININE 2.40*  < > 2.95*  < > 4.00* 4.17* 4.31* 5.17* 5.99*  CALCIUM 7.2*  < > 6.8*  < > 6.5* 6.5* 6.6* 6.9* 7.7*  MG 1.5  --  1.8  --  1.9  --   --   --   --   PHOS  --   --   --   --  5.2*  --   --  4.8* 5.2*  < > = values in this interval not displayed.  Liver Function Tests:  Recent Labs Lab 05/31/14 0430 06/01/14 0318 06/03/14 0345 06/04/14 0459  AST 139* 211*  --   --    ALT 45 71*  --   --   ALKPHOS 26* 29*  --   --   BILITOT 1.0 0.7  --   --   PROT 4.2* 4.2*  --   --   ALBUMIN 2.8* 2.3* 1.6* 1.8*    Recent Labs Lab 05/31/14 0430  AMYLASE 59   No results for input(s): AMMONIA in the last 168 hours.  CBC:  Recent Labs Lab 05/31/14 1550 06/01/14 0318 06/02/14 0330 06/03/14 0345 06/04/14 0459  WBC 15.6* 12.7* 9.4 8.3 9.5  HGB 9.0* 8.2* 6.9* 7.8* 9.8*  HCT 27.0* 24.8* 20.4* 22.1* 29.2*  MCV 90.3 91.2 92.3 89.5 87.4  PLT 83* 76* 79* 97* 133*    Cardiac Enzymes:  Recent Labs Lab 05/30/14 1744  CKTOTAL 328*    BNP: Invalid input(s): POCBNP  CBG:  Recent Labs Lab 06/03/14 1225 06/03/14 1634 06/03/14 1930 06/03/14 2345 06/04/14 0346  GLUCAP 94 89 87 88 81    Microbiology: Results for orders placed or  performed during the hospital encounter of 05/30/14  Culture, respiratory (NON-Expectorated)     Status: None   Collection Time: 06/02/14 12:43 PM  Result Value Ref Range Status   Specimen Description ENDOTRACHEAL  Final   Special Requests NONE  Final   Gram Stain   Final    RARE WBC PRESENT,BOTH PMN AND MONONUCLEAR NO SQUAMOUS EPITHELIAL CELLS SEEN NO ORGANISMS SEEN Performed at [facility]    Culture   Final    NO GROWTH 2 DAYS Performed at [facility]    Report Status 06/04/2014 FINAL  Final    Coagulation Studies:  Recent Labs  06/04/14 0459  LABPROT 15.4*  INR 1.21    Urinalysis: No results for input(s): COLORURINE, LABSPEC, PHURINE, GLUCOSEU, HGBUR, BILIRUBINUR, KETONESUR, PROTEINUR, UROBILINOGEN, NITRITE, LEUKOCYTESUR in the last 72 hours.  Invalid input(s): APPERANCEUR    Imaging: Dg Chest Port 1 View  06/04/2014   CLINICAL DATA:  Ventilator dependent respiratory failure.  EXAM: PORTABLE CHEST - 1 VIEW  COMPARISON:  06/03/2014  FINDINGS: Patient slightly rotated left. Sternotomy wires unchanged. Endotracheal tube has tip 5.4 cm above the carinal. Enteric tube courses into the  region of the stomach and off the inferior portion of the film. Right IJ venous catheter unchanged with tip overlying SVC.  Persistent opacification over the left base likely a combination of pleural fluid and atelectasis although cannot exclude infection. Improved aeration over the right base. Stable mild cardiomegaly. Remainder of the exam is unchanged.  IMPRESSION: Persistent left base opacification likely effusion with atelectasis although cannot exclude infection.  Tubes and lines as described.   Electronically Signed   By: Marin Olp M.D.   On: 06/04/2014 08:10   Dg Chest Port 1 View  06/03/2014   CLINICAL DATA:  CHF.  EXAM: PORTABLE CHEST - 1 VIEW  COMPARISON:  06/02/2014.  FINDINGS: Tracheostomy tube, right IJ line, NG tube in stable position. Stable cardiomegaly. Prior CABG. Interim partial clearing of pulmonary interstitial edema. Persistent small left pleural effusion. No pneumothorax.  IMPRESSION: 1. Lines and tubes in stable position. 2. Persistent cardiomegaly. Prior CABG. Interim partial clearing of pulmonary interstitial edema. Persistent small left pleural effusion .   Electronically Signed   By: Marcello Moores  Register   On: 06/03/2014 07:55     Medications:   . sodium chloride 10 mL/hr at 06/04/14 0900  . fentaNYL infusion INTRAVENOUS 350 mcg/hr (06/04/14 0900)  . phenylephrine (NEO-SYNEPHRINE) Adult infusion Stopped (06/03/14 1500)   . sodium chloride   Intravenous Once  . antiseptic oral rinse  7 mL Mouth Rinse QID  . chlorhexidine  15 mL Mouth Rinse BID  . enoxaparin (LOVENOX) injection  30 mg Subcutaneous Daily  . insulin aspart  0-9 Units Subcutaneous 6 times per day  . pantoprazole (PROTONIX) IV  40 mg Intravenous Q24H   acetaminophen **OR** acetaminophen, albuterol, fentaNYL, midazolam, midazolam, ondansetron  Assessment/ Plan:  1. Acute renal failure on chronic kidney disease stage III: Available database, history and timeline of events points to ischemic ATN  postoperatively Continue to limit nephrotoxin exposure such as iodinated contrast and nonsteroidals .  will follow  2. Anemia: using ESA 3. 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 VDRF: Ventilator management per CCM  There are no indications for dialysis right now and will follow    LOS: 5 Barry Lawrence W @TODAY @11 :26 AM

## 2014-06-04 NOTE — Progress Notes (Addendum)
   [facility]  Subjective  - Intbated, responds to touch with movement of all 4 extremities.   Objective 123/69 71 99.7 F (37.6 C) (Oral) 22 98%  Intake/Output Summary (Last 24 hours) at 06/04/14 0719 Last data filed at 06/04/14 0700  Gross per 24 hour  Intake 1977.5 ml  Output   1835 ml  Net  142.5 ml    Doppler signals 2+ DP/PT Abdomin distended, compressible, incision C/D/I Groins soft without hematoma, incisions healing well Heart irregular rate Lungs intubated  Assessment/Planning: POD # 5 aortobifemoral bypass graft for an abdominal aortic aneurysm. Also status post left renal artery bypass   No pressors since 06/03/2014 BP stable 120's/60'2 UO 25 cc/hr Cr 5.99 up from 5.17 Possible needs dialysis short term I will discuss this with Dr. [name] HGB 9.8 up from 7.8 after 2 units PRBC last night/ this am.   NPO NG output 1,200 last 24 hours, will discuss tube feedings with Dr. [name]  DVT prophylaxis SCD's    Theda Sers [facility] 06/04/2014 7:19 AM --  Laboratory Lab Results:  Recent Labs  06/03/14 0345 06/04/14 0459  WBC 8.3 9.5  HGB 7.8* 9.8*  HCT 22.1* 29.2*  PLT 97* 133*   BMET  Recent Labs  06/03/14 0345 06/04/14 0459  NA 141 144  K 3.3* 3.3*  CL 108 112  CO2 23 24  GLUCOSE 101* 95  BUN 51* 65*  CREATININE 5.17* 5.99*  CALCIUM 6.9* 7.7*    COAG Lab Results  Component Value Date   INR 1.21 06/04/2014   INR 1.46 05/31/2014   INR 1.42 05/30/2014   No results found for: PTT    I agree with the above.  The wife was updated today.  I discussed the case with critical care.  We will try to decrease his ventilator settings.  He will be started on trophic tube feeds.  I also spoke with Dr. Justin Mend with nephrology.  We will hold off on dialysis for now.  Annamarie Major

## 2014-06-04 NOTE — Progress Notes (Addendum)
NUTRITION FOLLOW UP / CONSULT  Intervention:    Initiate TF via OGT with Vital High Protein at 10 ml/h, do not advance at this time. TF will provide 240 kcals, 21 gm protein, 201 ml free water daily.  When able to advance TF, recommend increase by 10 ml every 4 hours to goal rate of 50 ml/h with Prostat 30 ml BID to provide 1400 kcals (68% of estimated needs), 135 gm protein, 1003 ml free water daily.  Nutrition Dx:   Inadequate oral intake related to inability to eat as evidenced by NPO status, ongoing.  Goal:   Enteral nutrition to provide 60-70% of estimated calorie needs (22-25 kcals/kg ideal body weight) and 100% of estimated protein needs, based on ASPEN guidelines for hypocaloric, high protein feeding in critically ill obese individuals, unmet.  Monitor:   TF tolerance/adequacy, weight trend, labs, vent status.  Assessment:   69 yo male active smoker with extensive PMH including hx CAD s/p CABG, CKD III, OSA on CPAP, DM, HTN, COPD, stable L apex 26mm nodule (followed by wert) presented 1/7 for elective repair of 5.6cm AAA. Remains intubated post op.   Patient is currently intubated on ventilator support. Received consult to begin trickle feedings, do not advance at this time. MV: 11.9 L/min Temp (24hrs), Avg:99.2 F (37.3 C), Min:98.7 F (37.1 C), Max:100.1 F (37.8 C)  Propofol: none  Height: Ht Readings from Last 1 Encounters:  05/30/14 5\' 7"  (1.702 m)    Weight Status:   Wt Readings from Last 1 Encounters:  06/04/14 238 lb 8.6 oz (108.2 kg)   05/30/14 197 lb 9 oz (89.614 kg)   Re-estimated needs:  Kcal: 2050 Protein: 135-160 gm Fluid: >/= 2 L  Skin: closed abdominal incision, closed right and left groin incisions  Diet Order: Diet NPO time specified   Intake/Output Summary (Last 24 hours) at 06/04/14 1612 Last data filed at 06/04/14 1500  Gross per 24 hour  Intake 1960.42 ml  Output   2100 ml  Net -139.58 ml    Last BM: None documented since  admission   Labs:   Recent Labs Lab 05/30/14 1733  05/31/14 0430  06/01/14 0318  06/02/14 0330 06/03/14 0345 06/04/14 0459  NA 139  < > 138  < > 140  < > 142 141 144  K 5.1  < > 4.0  < > 4.3  < > 3.6 3.3* 3.3*  CL 108  < > 110  < > 110  < > 109 108 112  CO2 23  < > 21  < > 18*  < > 26 23 24   BUN 23  < > 27*  < > 38*  < > 43* 51* 65*  CREATININE 2.40*  < > 2.95*  < > 4.00*  < > 4.31* 5.17* 5.99*  CALCIUM 7.2*  < > 6.8*  < > 6.5*  < > 6.6* 6.9* 7.7*  MG 1.5  --  1.8  --  1.9  --   --   --   --   PHOS  --   --   --   --  5.2*  --   --  4.8* 5.2*  GLUCOSE 178*  < > 138*  < > 123*  < > 133* 101* 95  < > = values in this interval not displayed.  CBG (last 3)   Recent Labs  06/04/14 0346 06/04/14 0759 06/04/14 1219  GLUCAP 81 84 76    Scheduled Meds: . sodium chloride  Intravenous Once  . antiseptic oral rinse  7 mL Mouth Rinse QID  . chlorhexidine  15 mL Mouth Rinse BID  . enoxaparin (LOVENOX) injection  30 mg Subcutaneous Daily  . insulin aspart  0-9 Units Subcutaneous 6 times per day  . pantoprazole (PROTONIX) IV  40 mg Intravenous Q24H    Continuous Infusions: . sodium chloride 10 mL/hr at 06/04/14 1500  . fentaNYL infusion INTRAVENOUS 400 mcg/hr (06/04/14 1500)  . phenylephrine (NEO-SYNEPHRINE) Adult infusion Stopped (06/03/14 1500)    Molli Barrows, RD, LDN, [place] Pager [phone] After Hours Pager [phone]

## 2014-06-04 NOTE — Progress Notes (Signed)
[place] Progress Note Patient Name: Barry Lawrence DOB: 03/08/1946 MRN: :[id number]   Date of Service  06/04/2014  HPI/Events of Note  Hypokalemia  eICU Interventions  Potassium replaced     Intervention Category Minor Interventions: Electrolytes abnormality - evaluation and management  DETERDING,ELIZABETH 06/04/2014, 5:58 AM

## 2014-06-04 NOTE — Progress Notes (Signed)
PULMONARY / CRITICAL CARE MEDICINE   Name: Barry Lawrence MRN: [place]:[id number] DOB: October 27, 1945    ADMISSION DATE:  05/30/2014 CONSULTATION DATE:  05/30/2014  REFERRING MD :  [name]   CHIEF COMPLAINT:  Vent management   INITIAL PRESENTATION:  69 yo male smoker for repair of AAA.  Remained on vent post-op.   PMHx of CAD s/p CABG, CKD III, OSA on CPAP, DM, HTN, mild COPD.  STUDIES:  None  SIGNIFICANT EVENTS: 1/07 OR>> elective repair AAA 1/10 transfuse 2 units PRBC, renal consulted  SUBJECTIVE:  Pressors off. UOP unchanged, S Cr rising  VITAL SIGNS: Temp:  [98.6 F (37 C)-100.1 F (37.8 C)] 98.8 F (37.1 C) (01/12 0700) Pulse Rate:  [58-92] 65 (01/12 0800) Resp:  [17-26] 22 (01/12 0800) BP: (98-145)/(42-83) 121/63 mmHg (01/12 0800) SpO2:  [95 %-100 %] 100 % (01/12 0800) Arterial Line BP: (115-170)/(44-74) 163/67 mmHg (01/12 0800) FiO2 (%):  [40 %] 40 % (01/12 0810) Weight:  [108.2 kg (238 lb 8.6 oz)] 108.2 kg (238 lb 8.6 oz) (01/12 0500) HEMODYNAMICS: CVP:  [6 mmHg-14 mmHg] 12 mmHg VENTILATOR SETTINGS: Vent Mode:  [-] PRVC FiO2 (%):  [40 %] 40 % Set Rate:  [22 bmp] 22 bmp Vt Set:  [530 mL] 530 mL PEEP:  [5 cmH20] 5 cmH20 Plateau Pressure:  [26 cmH20-31 cmH20] 26 cmH20 INTAKE / OUTPUT:  Intake/Output Summary (Last 24 hours) at 06/04/14 0913 Last data filed at 06/04/14 0735  Gross per 24 hour  Intake 1942.5 ml  Output   2080 ml  Net -137.5 ml    PHYSICAL EXAMINATION: General: no distress Neuro: RASS -2 to -3, unresponsive to voice HEENT: ETT in place Cardiovascular: regular Lungs: focal R exp wheeze, L clear Abdomen: soft, wound site clean Musculoskeletal: 1+ edema Skin: no rashes  LABS:  CBC  Recent Labs Lab 06/02/14 0330 06/03/14 0345 06/04/14 0459  WBC 9.4 8.3 9.5  HGB 6.9* 7.8* 9.8*  HCT 20.4* 22.1* 29.2*  PLT 79* 97* 133*     Coag's  Recent Labs Lab 05/30/14 1733 05/30/14 2040 05/31/14 0430 06/04/14 0459  APTT 39* 36 39*  --   INR  1.58* 1.42 1.46 1.21   BMET  Recent Labs Lab 06/02/14 0330 06/03/14 0345 06/04/14 0459  NA 142 141 144  K 3.6 3.3* 3.3*  CL 109 108 112  CO2 26 23 24   BUN 43* 51* 65*  CREATININE 4.31* 5.17* 5.99*  GLUCOSE 133* 101* 95   Electrolytes  Recent Labs Lab 05/30/14 1733  05/31/14 0430  06/01/14 0318  06/02/14 0330 06/03/14 0345 06/04/14 0459  CALCIUM 7.2*  < > 6.8*  < > 6.5*  < > 6.6* 6.9* 7.7*  MG 1.5  --  1.8  --  1.9  --   --   --   --   PHOS  --   --   --   --  5.2*  --   --  4.8* 5.2*  < > = values in this interval not displayed.   Sepsis Markers  Recent Labs Lab 05/30/14 1744  LATICACIDVEN 2.6*   ABG  Recent Labs Lab 06/01/14 1624 06/02/14 0338 06/03/14 0350  PHART 7.271* 7.356 7.423  PCO2ART 44.1 42.9 35.5  PO2ART 78.0* 83.0 80.0   Liver Enzymes  Recent Labs Lab 05/31/14 0430 06/01/14 0318 06/03/14 0345 06/04/14 0459  AST 139* 211*  --   --   ALT 45 71*  --   --   ALKPHOS 26* 29*  --   --  BILITOT 1.0 0.7  --   --   ALBUMIN 2.8* 2.3* 1.6* 1.8*    Glucose  Recent Labs Lab 06/03/14 0812 06/03/14 1225 06/03/14 1634 06/03/14 1930 06/03/14 2345 06/04/14 0346  GLUCAP 83 94 89 87 88 81    Imaging Dg Chest Port 1 View  06/03/2014   CLINICAL DATA:  CHF.  EXAM: PORTABLE CHEST - 1 VIEW  COMPARISON:  06/02/2014.  FINDINGS: Tracheostomy tube, right IJ line, NG tube in stable position. Stable cardiomegaly. Prior CABG. Interim partial clearing of pulmonary interstitial edema. Persistent small left pleural effusion. No pneumothorax.  IMPRESSION: 1. Lines and tubes in stable position. 2. Persistent cardiomegaly. Prior CABG. Interim partial clearing of pulmonary interstitial edema. Persistent small left pleural effusion .   Electronically Signed   By: Marcello Moores  Register   On: 06/03/2014 07:55     ASSESSMENT / PLAN:  PULMONARY OETT 01/07 >>> A: Acute respiratory failure with hypoxia following surgery for AAA repair. Pulmonary edema - improved  1/12 Hx of OSA, COPD. P:   Continue full vent support, work towards PSV when MS will allow Fio2 to keep Sao2 > 92% F/u CXR, ABG PRN BD's  CARDIOVASCULAR L radial a line 01/07 >>> R [place] 01/07 >>> 1/09 Rt IJ CVL through cordis 1/09 >> A:  Hypovolemic/hemorrhagic shock after AAA repair. Hx of CAD s/p CABG, HTN, HLD, PAD. P:  Dopamine weaned to off, phenylephrine started 1/11 now weaned to off  Goal CVP 8 to 12 Post-op care per [facility] Hold outpatient amlodipine, atorvastatin, carvedilol, ezetimibe, fenofibrate, hyzaar   RENAL A:   AKI in setting of hypovolemic/hemorrhagic shock >> Urine Na 19, Urine creatinine 160 from 1/09. Non anion gap metabolic acidosis >> improved. CKD III >> baseline creatinine 1.8 to 2.1. P:   Monitor renal fx, urine outpt Nephrology consulted 1/10 >> may be approaching HD, no urgent indication but suspect he will require this week.  His wife tells me that he would not want life-long HD, but that we should consider short term HD  GASTROINTESTINAL A:   Nutrition. Hx of GERD. P:   TF's running Protonix for SUP  HEMATOLOGIC A:  Acute blood loss anemia. Thrombocytopenia >> PLT 207 from 05/22/14. P: F/u CBC Transfuse for Hb < 7 SCD for DVT prevention  INFECTIOUS A:   Surgical prophylaxis >> off Abx 1/08. P:   Monitor off Abx sputum cx 1/10 >>   ENDOCRINE A:   No acute issues. P:   Monitor blood sugars  NEUROLOGIC A:   Acute metabolic encephalopathy. P:   RASS goal 0, currently -3 Wean off versed gtt on 1/12 Continue fentanyl gtt  Inter-disciplinary family meet or Palliative Care meeting due by:  1/24   CC time 35 minutes.  Baltazar Apo, MD, PhD 06/04/2014, 9:13 AM [facility] [phone] or if no answer [phone]

## 2014-06-05 ENCOUNTER — Inpatient Hospital Stay (HOSPITAL_COMMUNITY): Payer: Medicare Other

## 2014-06-05 DIAGNOSIS — J9600 Acute respiratory failure, unspecified whether with hypoxia or hypercapnia: Secondary | ICD-10-CM | POA: Insufficient documentation

## 2014-06-05 LAB — RENAL FUNCTION PANEL
Albumin: 1.7 g/dL — ABNORMAL LOW (ref 3.5–5.2)
Anion gap: 12 (ref 5–15)
BUN: 92 mg/dL — ABNORMAL HIGH (ref 6–23)
CO2: 24 mmol/L (ref 19–32)
Calcium: 8.3 mg/dL — ABNORMAL LOW (ref 8.4–10.5)
Chloride: 111 mEq/L (ref 96–112)
Creatinine, Ser: 7.14 mg/dL — ABNORMAL HIGH (ref 0.50–1.35)
GFR calc Af Amer: 8 mL/min — ABNORMAL LOW (ref >90)
GFR calc non Af Amer: 7 mL/min — ABNORMAL LOW (ref >90)
Glucose, Bld: 110 mg/dL — ABNORMAL HIGH (ref 70–99)
Phosphorus: 6.4 mg/dL — ABNORMAL HIGH (ref 2.3–4.6)
Potassium: 4 mmol/L (ref 3.5–5.1)
Sodium: 147 mmol/L — ABNORMAL HIGH (ref 135–145)

## 2014-06-05 LAB — GLUCOSE, CAPILLARY
Glucose-Capillary: 100 mg/dL — ABNORMAL HIGH (ref 70–99)
Glucose-Capillary: 100 mg/dL — ABNORMAL HIGH (ref 70–99)
Glucose-Capillary: 100 mg/dL — ABNORMAL HIGH (ref 70–99)
Glucose-Capillary: 113 mg/dL — ABNORMAL HIGH (ref 70–99)
Glucose-Capillary: 94 mg/dL (ref 70–99)
Glucose-Capillary: 98 mg/dL (ref 70–99)
Glucose-Capillary: 99 mg/dL (ref 70–99)

## 2014-06-05 LAB — BASIC METABOLIC PANEL
Anion gap: 12 (ref 5–15)
BUN: 84 mg/dL — ABNORMAL HIGH (ref 6–23)
CO2: 23 mmol/L (ref 19–32)
Calcium: 8 mg/dL — ABNORMAL LOW (ref 8.4–10.5)
Chloride: 106 mEq/L (ref 96–112)
Creatinine, Ser: 6.81 mg/dL — ABNORMAL HIGH (ref 0.50–1.35)
GFR calc Af Amer: 9 mL/min — ABNORMAL LOW (ref >90)
GFR calc non Af Amer: 7 mL/min — ABNORMAL LOW (ref >90)
Glucose, Bld: 106 mg/dL — ABNORMAL HIGH (ref 70–99)
Potassium: 3.5 mmol/L (ref 3.5–5.1)
Sodium: 141 mmol/L (ref 135–145)

## 2014-06-05 LAB — CBC
HCT: 30 % — ABNORMAL LOW (ref 39.0–52.0)
Hemoglobin: 10.2 g/dL — ABNORMAL LOW (ref 13.0–17.0)
MCH: 30.5 pg (ref 26.0–34.0)
MCHC: 34 g/dL (ref 30.0–36.0)
MCV: 89.8 fL (ref 78.0–100.0)
Platelets: 184 10*3/uL (ref 150–400)
RBC: 3.34 MIL/uL — ABNORMAL LOW (ref 4.22–5.81)
RDW: 16.2 % — ABNORMAL HIGH (ref 11.5–15.5)
WBC: 9.1 10*3/uL (ref 4.0–10.5)

## 2014-06-05 MED ORDER — AMLODIPINE BESYLATE 10 MG PO TABS
10.0000 mg | ORAL_TABLET | Freq: Every day | ORAL | Status: DC
  Administered 2014-06-05 – 2014-06-13 (×8): 10 mg
  Filled 2014-06-05 (×11): qty 1

## 2014-06-05 MED ORDER — PRISMASOL BGK 4/2.5 32-4-2.5 MEQ/L IV SOLN
INTRAVENOUS | Status: DC
  Administered 2014-06-05 – 2014-06-07 (×14): via INTRAVENOUS_CENTRAL
  Filled 2014-06-05 (×33): qty 5000

## 2014-06-05 MED ORDER — MIDAZOLAM HCL 2 MG/2ML IJ SOLN: 4.0000 mg | Freq: Once | INTRAMUSCULAR | Status: AC

## 2014-06-05 MED ORDER — SODIUM CHLORIDE 0.9 % FOR CRRT
INTRAVENOUS_CENTRAL | Status: DC | PRN
  Filled 2014-06-05: qty 1000

## 2014-06-05 MED ORDER — CARVEDILOL 6.25 MG PO TABS
6.2500 mg | ORAL_TABLET | Freq: Two times a day (BID) | ORAL | Status: DC
  Administered 2014-06-05 – 2014-06-19 (×27): 6.25 mg
  Filled 2014-06-05 (×31): qty 1

## 2014-06-05 MED ORDER — FUROSEMIDE 10 MG/ML IJ SOLN
80.0000 mg | Freq: Four times a day (QID) | INTRAMUSCULAR | Status: DC
  Administered 2014-06-05 – 2014-06-13 (×32): 80 mg via INTRAVENOUS
  Filled 2014-06-05 (×43): qty 8

## 2014-06-05 MED ORDER — PRISMASOL BGK 4/2.5 32-4-2.5 MEQ/L IV SOLN
INTRAVENOUS | Status: DC
  Administered 2014-06-05 – 2014-06-07 (×3): via INTRAVENOUS_CENTRAL
  Filled 2014-06-05 (×4): qty 5000

## 2014-06-05 MED ORDER — CARVEDILOL 6.25 MG PO TABS: 6.2500 mg | ORAL_TABLET | Freq: Two times a day (BID) | ORAL | Status: DC

## 2014-06-05 MED ORDER — PRISMASOL BGK 4/2.5 32-4-2.5 MEQ/L IV SOLN
INTRAVENOUS | Status: DC
  Administered 2014-06-05 – 2014-06-06 (×2): via INTRAVENOUS_CENTRAL
  Filled 2014-06-05 (×3): qty 5000

## 2014-06-05 MED ORDER — HEPARIN SODIUM (PORCINE) 1000 UNIT/ML DIALYSIS
1000.0000 [IU] | INTRAMUSCULAR | Status: DC | PRN
  Administered 2014-06-07: 2800 [IU] via INTRAVENOUS_CENTRAL
  Filled 2014-06-05 (×2): qty 6
  Filled 2014-06-05: qty 3

## 2014-06-05 NOTE — Progress Notes (Signed)
RT Note- PT was placed back on full support for HD catheter placement.

## 2014-06-05 NOTE — Progress Notes (Signed)
Waste 20 mL Versed in sink with Loma Newton, RN.

## 2014-06-05 NOTE — Progress Notes (Signed)
Wasted 50 ml fentanyl per protocol per tubing change/witnessed by Milagros Loll

## 2014-06-05 NOTE — Progress Notes (Signed)
[facility] ROUNDING NOTE   Subjective:   Interval History: no changes or developments overnight  Objective:  Vital signs in last 24 hours:  Temp:  [99.1 F (37.3 C)-101.7 F (38.7 C)] 101.7 F (38.7 C) (01/13 1242) Pulse Rate:  [63-119] 110 (01/13 1200) Resp:  [17-25] 25 (01/13 1200) BP: (125-200)/(54-100) 163/72 mmHg (01/13 1200) SpO2:  [91 %-97 %] 96 % (01/13 1200) Arterial Line BP: (134-251)/(52-96) 221/87 mmHg (01/13 1200) FiO2 (%):  [40 %] 40 % (01/13 0953) Weight:  [108.9 kg (240 lb 1.3 oz)] 108.9 kg (240 lb 1.3 oz) (01/13 0500)  Weight change: 0.7 kg (1 lb 8.7 oz) Filed Weights   06/03/14 0500 06/04/14 0500 06/05/14 0500  Weight: 105.6 kg (232 lb 12.9 oz) 108.2 kg (238 lb 8.6 oz) 108.9 kg (240 lb 1.3 oz)    Intake/Output: I/O last 3 completed shifts: In: 2840.1 [I.V.:1750.4; Blood:670; NG/GT:319.7; IV Piggyback:100] Out: [id number] [Urine:1080; Emesis/NG output:1500]   Intake/Output this shift:  Total I/O In: 195 [I.V.:155; NG/GT:40] Out: 150 [Urine:150]  CVS- RRR RS- CTA ABD- BS present soft non-distended EXT- no edema   Basic Metabolic Panel:  Recent Labs Lab 05/30/14 1733  05/31/14 0430  06/01/14 0318 06/01/14 1620 06/02/14 0330 06/03/14 0345 06/04/14 0459 06/05/14 0729  NA 139  < > 138  < > 140 141 142 141 144 141  K 5.1  < > 4.0  < > 4.3 4.1 3.6 3.3* 3.3* 3.5  CL 108  < > 110  < > 110 113* 109 108 112 106  CO2 23  < > 21  < > 18* 24 26 23 24 23   GLUCOSE 178*  < > 138*  < > 123* 127* 133* 101* 95 106*  BUN 23  < > 27*  < > 38* 41* 43* 51* 65* 84*  CREATININE 2.40*  < > 2.95*  < > 4.00* 4.17* 4.31* 5.17* 5.99* 6.81*  CALCIUM 7.2*  < > 6.8*  < > 6.5* 6.5* 6.6* 6.9* 7.7* 8.0*  MG 1.5  --  1.8  --  1.9  --   --   --   --   --   PHOS  --   --   --   --  5.2*  --   --  4.8* 5.2*  --   < > = values in this interval not displayed.  Liver Function Tests:  Recent Labs Lab 05/31/14 0430 06/01/14 0318 06/03/14 0345 06/04/14 0459  AST  139* 211*  --   --   ALT 45 71*  --   --   ALKPHOS 26* 29*  --   --   BILITOT 1.0 0.7  --   --   PROT 4.2* 4.2*  --   --   ALBUMIN 2.8* 2.3* 1.6* 1.8*    Recent Labs Lab 05/31/14 0430  AMYLASE 59   No results for input(s): AMMONIA in the last 168 hours.  CBC:  Recent Labs Lab 06/01/14 0318 06/02/14 0330 06/03/14 0345 06/04/14 0459 06/05/14 0729  WBC 12.7* 9.4 8.3 9.5 9.1  HGB 8.2* 6.9* 7.8* 9.8* 10.2*  HCT 24.8* 20.4* 22.1* 29.2* 30.0*  MCV 91.2 92.3 89.5 87.4 89.8  PLT 76* 79* 97* 133* 184    Cardiac Enzymes:  Recent Labs Lab 05/30/14 1744  CKTOTAL 328*    BNP: Invalid input(s): POCBNP  CBG:  Recent Labs Lab 06/04/14 1635 06/04/14 1929 06/04/14 2357 06/05/14 0338 06/05/14 0827  GLUCAP 87 85 94 100* 100*  Microbiology: Results for orders placed or performed during the hospital encounter of 05/30/14  Culture, respiratory (NON-Expectorated)     Status: None   Collection Time: 06/02/14 12:43 PM  Result Value Ref Range Status   Specimen Description ENDOTRACHEAL  Final   Special Requests NONE  Final   Gram Stain   Final    RARE WBC PRESENT,BOTH PMN AND MONONUCLEAR NO SQUAMOUS EPITHELIAL CELLS SEEN NO ORGANISMS SEEN Performed at [facility]    Culture   Final    NO GROWTH 2 DAYS Performed at [facility]    Report Status 06/04/2014 FINAL  Final    Coagulation Studies:  Recent Labs  06/04/14 0459  LABPROT 15.4*  INR 1.21    Urinalysis: No results for input(s): COLORURINE, LABSPEC, PHURINE, GLUCOSEU, HGBUR, BILIRUBINUR, KETONESUR, PROTEINUR, UROBILINOGEN, NITRITE, LEUKOCYTESUR in the last 72 hours.  Invalid input(s): APPERANCEUR    Imaging: Dg Chest Port 1 View  06/05/2014   CLINICAL DATA:  Intubated  EXAM: PORTABLE CHEST - 1 VIEW  COMPARISON:  06/04/2014  FINDINGS: Cardiomediastinal silhouette is stable. Again noted status post median sternotomy and CABG. Endotracheal tube in place with tip 4.5 cm above the carina.  Stable NG tube position. Persistent small left pleural effusion with left basilar atelectasis or infiltrate. Central mild vascular congestion without convincing pulmonary edema. Right IJ central line is unchanged in position.  IMPRESSION: Stable support apparatus. Persistent small left pleural effusion with left basilar atelectasis or infiltrate. Central mild vascular congestion without convincing pulmonary edema.   Electronically Signed   By: Lahoma Crocker M.D.   On: 06/05/2014 09:01   Dg Chest Port 1 View  06/04/2014   CLINICAL DATA:  Ventilator dependent respiratory failure.  EXAM: PORTABLE CHEST - 1 VIEW  COMPARISON:  06/03/2014  FINDINGS: Patient slightly rotated left. Sternotomy wires unchanged. Endotracheal tube has tip 5.4 cm above the carinal. Enteric tube courses into the region of the stomach and off the inferior portion of the film. Right IJ venous catheter unchanged with tip overlying SVC.  Persistent opacification over the left base likely a combination of pleural fluid and atelectasis although cannot exclude infection. Improved aeration over the right base. Stable mild cardiomegaly. Remainder of the exam is unchanged.  IMPRESSION: Persistent left base opacification likely effusion with atelectasis although cannot exclude infection.  Tubes and lines as described.   Electronically Signed   By: Marin Olp M.D.   On: 06/04/2014 08:10   Dg Abd Portable 1v  06/04/2014   CLINICAL DATA:  Gastric tube placement  EXAM: PORTABLE ABDOMEN - 1 VIEW  COMPARISON:  Portable exam 1854 hr compared to CT abdomen and pelvis 03/05/2014  FINDINGS: Tip of gastric tube projects over expected position of the proximal descending duodenum.  Bowel gas pattern normal.  Prior median sternotomy.  Opacified LEFT lung base.  Osseous demineralization.  IMPRESSION: Tip of gastric tube projects over expected position of the proximal descending duodenum.   Electronically Signed   By: Lavonia Dana M.D.   On: 06/04/2014 19:24      Medications:   . sodium chloride 10 mL/hr (06/05/14 0822)  . fentaNYL infusion INTRAVENOUS Stopped (06/05/14 1200)   . sodium chloride   Intravenous Once  . amLODipine  10 mg Per Tube QAC breakfast  . antiseptic oral rinse  7 mL Mouth Rinse QID  . carvedilol  6.25 mg Per Tube BID  . chlorhexidine  15 mL Mouth Rinse BID  . enoxaparin (LOVENOX) injection  30 mg Subcutaneous  Daily  . feeding supplement (VITAL HIGH PROTEIN)  1,000 mL Per Tube Q24H  . insulin aspart  0-9 Units Subcutaneous 6 times per day  . pantoprazole (PROTONIX) IV  40 mg Intravenous Q24H   acetaminophen **OR** acetaminophen, albuterol, fentaNYL, hydrALAZINE, midazolam, midazolam, ondansetron  Assessment/ Plan:  1. Acute renal failure on chronic kidney disease stage III: Available database, history and timeline of events points to ischemic ATN postoperatively Continue to limit nephrotoxin exposure such as iodinated contrast and nonsteroidals .  will follow  2. Anemia: using ESA 3. 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 VDRF: Ventilator management per CCM  There are no indications for dialysis right now and however creatinine continues to rise  Will see how his urine output does with lasix      LOS: 6 Kimimila Tauzin W @TODAY @12 :55 PM

## 2014-06-05 NOTE — Progress Notes (Signed)
PULMONARY / CRITICAL CARE MEDICINE   Name: Barry Lawrence MRN: [place]:[id number] DOB: 1946-01-09    ADMISSION DATE:  05/30/2014 CONSULTATION DATE:  05/30/2014  REFERRING MD :  [name]   CHIEF COMPLAINT:  Vent management   INITIAL PRESENTATION:  69 yo male smoker for repair of AAA.  Remained on vent post-op.   PMHx of CAD s/p CABG, CKD III, OSA on CPAP, DM, HTN, mild COPD.  STUDIES:  None  SIGNIFICANT EVENTS: 1/07 OR>> elective repair AAA 1/10 transfuse 2 units PRBC, renal consulted  SUBJECTIVE:  Continues to require sedation Serum creatinine rising but urine output has improved slightly last 24 hours;   VITAL SIGNS: Temp:  [98.9 F (37.2 C)-100.7 F (38.2 C)] 100.7 F (38.2 C) (01/13 0830) Pulse Rate:  [63-119] 95 (01/13 1000) Resp:  [18-22] 18 (01/13 1000) BP: (125-200)/(54-100) 130/62 mmHg (01/13 1000) SpO2:  [91 %-97 %] 93 % (01/13 1000) Arterial Line BP: (134-251)/(52-96) 163/65 mmHg (01/13 1000) FiO2 (%):  [40 %] 40 % (01/13 0953) Weight:  [108.9 kg (240 lb 1.3 oz)] 108.9 kg (240 lb 1.3 oz) (01/13 0500) HEMODYNAMICS: CVP:  [8 mmHg-17 mmHg] 11 mmHg VENTILATOR SETTINGS: Vent Mode:  [-] PSV;CPAP FiO2 (%):  [40 %] 40 % Set Rate:  [22 bmp] 22 bmp Vt Set:  [530 mL] 530 mL PEEP:  [5 cmH20] 5 cmH20 Pressure Support:  [16 cmH20] 16 cmH20 Plateau Pressure:  [22 cmH20-30 cmH20] 30 cmH20 INTAKE / OUTPUT:  Intake/Output Summary (Last 24 hours) at 06/05/14 1050 Last data filed at 06/05/14 1000  Gross per 24 hour  Intake 1380.09 ml  Output   1325 ml  Net  55.09 ml    PHYSICAL EXAMINATION: General: no distress Neuro: RASS -1, becomes agitated to stimulation, tries to speak, moves all extremities HEENT: ETT in place Cardiovascular: regular Lungs: focal R exp wheeze, L clear Abdomen: soft, wound site clean Musculoskeletal: 1+ edema  Skin: no rashes  LABS:  CBC  Recent Labs Lab 06/03/14 0345 06/04/14 0459 06/05/14 0729  WBC 8.3 9.5 9.1  HGB 7.8* 9.8* 10.2*  HCT  22.1* 29.2* 30.0*  PLT 97* 133* 184     Coag's  Recent Labs Lab 05/30/14 1733 05/30/14 2040 05/31/14 0430 06/04/14 0459  APTT 39* 36 39*  --   INR 1.58* 1.42 1.46 1.21   BMET  Recent Labs Lab 06/03/14 0345 06/04/14 0459 06/05/14 0729  NA 141 144 141  K 3.3* 3.3* 3.5  CL 108 112 106  CO2 23 24 23   BUN 51* 65* 84*  CREATININE 5.17* 5.99* 6.81*  GLUCOSE 101* 95 106*   Electrolytes  Recent Labs Lab 05/30/14 1733  05/31/14 0430  06/01/14 0318  06/03/14 0345 06/04/14 0459 06/05/14 0729  CALCIUM 7.2*  < > 6.8*  < > 6.5*  < > 6.9* 7.7* 8.0*  MG 1.5  --  1.8  --  1.9  --   --   --   --   PHOS  --   --   --   --  5.2*  --  4.8* 5.2*  --   < > = values in this interval not displayed.   Sepsis Markers  Recent Labs Lab 05/30/14 1744  LATICACIDVEN 2.6*   ABG  Recent Labs Lab 06/01/14 1624 06/02/14 0338 06/03/14 0350  PHART 7.271* 7.356 7.423  PCO2ART 44.1 42.9 35.5  PO2ART 78.0* 83.0 80.0   Liver Enzymes  Recent Labs Lab 05/31/14 0430 06/01/14 0318 06/03/14 0345 06/04/14 0459  AST  139* 211*  --   --   ALT 45 71*  --   --   ALKPHOS 26* 29*  --   --   BILITOT 1.0 0.7  --   --   ALBUMIN 2.8* 2.3* 1.6* 1.8*    Glucose  Recent Labs Lab 06/04/14 1219 06/04/14 1635 06/04/14 1929 06/04/14 2357 06/05/14 0338 06/05/14 0827  GLUCAP 76 87 85 94 100* 100*    Imaging Dg Chest Port 1 View  06/04/2014   CLINICAL DATA:  Ventilator dependent respiratory failure.  EXAM: PORTABLE CHEST - 1 VIEW  COMPARISON:  06/03/2014  FINDINGS: Patient slightly rotated left. Sternotomy wires unchanged. Endotracheal tube has tip 5.4 cm above the carinal. Enteric tube courses into the region of the stomach and off the inferior portion of the film. Right IJ venous catheter unchanged with tip overlying SVC.  Persistent opacification over the left base likely a combination of pleural fluid and atelectasis although cannot exclude infection. Improved aeration over the right base.  Stable mild cardiomegaly. Remainder of the exam is unchanged.  IMPRESSION: Persistent left base opacification likely effusion with atelectasis although cannot exclude infection.  Tubes and lines as described.   Electronically Signed   By: Marin Olp M.D.   On: 06/04/2014 08:10   Dg Abd Portable 1v  06/04/2014   CLINICAL DATA:  Gastric tube placement  EXAM: PORTABLE ABDOMEN - 1 VIEW  COMPARISON:  Portable exam 1854 hr compared to CT abdomen and pelvis 03/05/2014  FINDINGS: Tip of gastric tube projects over expected position of the proximal descending duodenum.  Bowel gas pattern normal.  Prior median sternotomy.  Opacified LEFT lung base.  Osseous demineralization.  IMPRESSION: Tip of gastric tube projects over expected position of the proximal descending duodenum.   Electronically Signed   By: Lavonia Dana M.D.   On: 06/04/2014 19:24     ASSESSMENT / PLAN:  PULMONARY OETT 01/07 >>> A: Acute respiratory failure with hypoxia following surgery for AAA repair. Pulmonary edema - improving 1/12 Hx of OSA, COPD. P:   Continue full vent support, work towards PSV when MS will allow. Volume status may be an issue here Fio2 to keep Sao2 > 92% F/u CXR, ABG PRN BD's  CARDIOVASCULAR L radial a line 01/07 >>> R Swan 01/07 >>> 1/09 Rt IJ CVL through cordis 1/09 >> A:  Hypovolemic/hemorrhagic shock after AAA repair. Hx of CAD s/p CABG, HTN, HLD, PAD. P:  Dopamine weaned to off, phenylephrine started 1/11 now weaned to off  Goal CVP 8 to 12 Post-op care per VVS Restart carvedolol and amlodipine Hold outpatient atorvastatin, ezetimibe, fenofibrate, hyzaar   RENAL A:   AKI in setting of hypovolemic/hemorrhagic shock >> Urine Na 19, Urine creatinine 160 from 1/09. Non anion gap metabolic acidosis >> improved. CKD III >> baseline creatinine 1.8 to 2.1. P:   Monitor renal fx, urine outpt He is 18L positive, has a rising S Cr. Suspect he will not progress to extubation without HD. Will prepare to  place HD catheter.   GASTROINTESTINAL A:   Nutrition. Hx of GERD. P:   TF's running Protonix for SUP  HEMATOLOGIC A:  Acute blood loss anemia. Thrombocytopenia >> PLT 207 from 05/22/14. P: F/u CBC Transfuse for Hb < 7 SCD for DVT prevention  INFECTIOUS A:   Surgical prophylaxis >> off Abx 1/08. P:   Monitor off Abx sputum cx 1/10 >> negative  ENDOCRINE A:   No acute issues. P:   Monitor blood sugars  NEUROLOGIC A:   Acute metabolic encephalopathy. P:   RASS goal 0, currently -1 to -2 Wean off versed gtt on 1/12 Continue fentanyl gtt  Inter-disciplinary family meet or Palliative Care meeting due by:  1/24   CC time 35 minutes.  Baltazar Apo, MD, PhD 06/05/2014, 10:50 AM [facility] [phone] or if no answer [phone]

## 2014-06-05 NOTE — Progress Notes (Addendum)
   [facility]  Subjective  - Intubated partial sedation fentanyl and versed.  Feeding supplement started.   Objective 130/67 77 100 F (37.8 C) (Oral) 22 93%  Intake/Output Summary (Last 24 hours) at 06/05/14 0727 Last data filed at 06/05/14 0700  Gross per 24 hour  Intake 1460.09 ml  Output   1325 ml  Net 135.09 ml   Doppler 2+ DP/PT bilateral.  No edema. Abdomin distended, soft.  Positive left lateral BS hypoactive. Lungs intubated Heart sinus rate 82 bpm Incision clean dry and intact   Assessment/Planning: POD # 6 aortobifemoral bypass graft for an abdominal aortic aneurysm. Also status post left renal artery bypass  Feeding supplement NG tube started, NPO AM labs pending urine output 50 cc/hr DVT SCD's Lovenox sq   Laurence Slate [facility] 06/05/2014 7:27 AM --  Laboratory Lab Results:  Recent Labs  06/03/14 0345 06/04/14 0459  WBC 8.3 9.5  HGB 7.8* 9.8*  HCT 22.1* 29.2*  PLT 97* 133*   BMET  Recent Labs  06/03/14 0345 06/04/14 0459  NA 141 144  K 3.3* 3.3*  CL 108 112  CO2 23 24  GLUCOSE 101* 95  BUN 51* 65*  CREATININE 5.17* 5.99*  CALCIUM 6.9* 7.7*    COAG Lab Results  Component Value Date   INR 1.21 06/04/2014   INR 1.46 05/31/2014   INR 1.42 05/30/2014   No results found for: PTT   I agree with the above.  Patient is no longer requiring pressors.  He does wake up with decreased sedation.  He is tolerating trophic tube feeds.  Creatinine is increased today.  More than likely, he is going to require dialysis in the immediate future.  I did discuss this with his wife yesterday.  The patient did not want dialysis initially, however hopefully this will be temporary.  Annamarie Major

## 2014-06-05 NOTE — Procedures (Signed)
Central Venous Hemodialysis Catheter Insertion Procedure Note Barry Lawrence [place]:[id number] [birth year]  Procedure: Insertion of Central Venous Catheter Indications: hemodialysis   Procedure Details Consent: Risks of procedure as well as the alternatives and risks of each were explained to the (patient/caregiver).  Consent for procedure obtained. Time Out: Verified patient identification, verified procedure, site/side was marked, verified correct patient position, special equipment/implants available, medications/allergies/relevent history reviewed, required imaging and test results available.  Performed Real time [place] was used to ID and cannulate the vessel  Maximum sterile technique was used including antiseptics, cap, gloves, gown, hand hygiene, mask and sheet. Skin prep: Chlorhexidine; local anesthetic administered A antimicrobial bonded/coated triple lumen catheter was placed in the left internal jugular vein using the Seldinger technique.  Evaluation Blood flow good Complications: No apparent complications Patient did tolerate procedure well. Chest X-ray ordered to verify placement.  CXR: pending.  BABCOCK,PETE 06/05/2014, 1:55 PM  Baltazar Apo, MD, PhD 06/05/2014, 2:34 PM [facility] [phone] or if no answer [phone]

## 2014-06-06 ENCOUNTER — Inpatient Hospital Stay (HOSPITAL_COMMUNITY): Payer: Medicare Other

## 2014-06-06 LAB — RENAL FUNCTION PANEL
Albumin: 1.8 g/dL — ABNORMAL LOW (ref 3.5–5.2)
Albumin: 1.9 g/dL — ABNORMAL LOW (ref 3.5–5.2)
Anion gap: 7 (ref 5–15)
Anion gap: 7 (ref 5–15)
BUN: 63 mg/dL — ABNORMAL HIGH (ref 6–23)
BUN: 48 mg/dL — ABNORMAL HIGH (ref 6–23)
CO2: 27 mmol/L (ref 19–32)
CO2: 29 mmol/L (ref 19–32)
Calcium: 8.2 mg/dL — ABNORMAL LOW (ref 8.4–10.5)
Calcium: 8.2 mg/dL — ABNORMAL LOW (ref 8.4–10.5)
Chloride: 110 mEq/L (ref 96–112)
Chloride: 106 mEq/L (ref 96–112)
Creatinine, Ser: 4.58 mg/dL — ABNORMAL HIGH (ref 0.50–1.35)
Creatinine, Ser: 3.54 mg/dL — ABNORMAL HIGH (ref 0.50–1.35)
GFR calc Af Amer: 14 mL/min — ABNORMAL LOW (ref >90)
GFR calc Af Amer: 19 mL/min — ABNORMAL LOW (ref >90)
GFR calc non Af Amer: 12 mL/min — ABNORMAL LOW (ref >90)
GFR calc non Af Amer: 16 mL/min — ABNORMAL LOW (ref >90)
Glucose, Bld: 107 mg/dL — ABNORMAL HIGH (ref 70–99)
Glucose, Bld: 123 mg/dL — ABNORMAL HIGH (ref 70–99)
Phosphorus: 4.4 mg/dL (ref 2.3–4.6)
Phosphorus: 4.9 mg/dL — ABNORMAL HIGH (ref 2.3–4.6)
Potassium: 4 mmol/L (ref 3.5–5.1)
Potassium: 4.1 mmol/L (ref 3.5–5.1)
Sodium: 144 mmol/L (ref 135–145)
Sodium: 142 mmol/L (ref 135–145)

## 2014-06-06 LAB — POCT I-STAT 3, ART BLOOD GAS (G3+)
Acid-base deficit: 3 mmol/L — ABNORMAL HIGH (ref 0.0–2.0)
Bicarbonate: 25.6 mEq/L — ABNORMAL HIGH (ref 20.0–24.0)
O2 Saturation: 91 %
TCO2: 27 mmol/L (ref 0–100)
pCO2 arterial: 59.7 mmHg (ref 35.0–45.0)
pH, Arterial: 7.24 — ABNORMAL LOW (ref 7.350–7.450)
pO2, Arterial: 74 mmHg — ABNORMAL LOW (ref 80.0–100.0)

## 2014-06-06 LAB — PHOSPHORUS: Phosphorus: 4.5 mg/dL (ref 2.3–4.6)

## 2014-06-06 LAB — CBC
HCT: 31.2 % — ABNORMAL LOW (ref 39.0–52.0)
Hemoglobin: 10.4 g/dL — ABNORMAL LOW (ref 13.0–17.0)
MCH: 30.4 pg (ref 26.0–34.0)
MCHC: 33.3 g/dL (ref 30.0–36.0)
MCV: 91.2 fL (ref 78.0–100.0)
Platelets: 197 10*3/uL (ref 150–400)
RBC: 3.42 MIL/uL — ABNORMAL LOW (ref 4.22–5.81)
RDW: 16 % — ABNORMAL HIGH (ref 11.5–15.5)
WBC: 8.1 10*3/uL (ref 4.0–10.5)

## 2014-06-06 LAB — BASIC METABOLIC PANEL
Anion gap: 8 (ref 5–15)
BUN: 63 mg/dL — ABNORMAL HIGH (ref 6–23)
CO2: 26 mmol/L (ref 19–32)
Calcium: 8.2 mg/dL — ABNORMAL LOW (ref 8.4–10.5)
Chloride: 110 mEq/L (ref 96–112)
Creatinine, Ser: 4.59 mg/dL — ABNORMAL HIGH (ref 0.50–1.35)
GFR calc Af Amer: 14 mL/min — ABNORMAL LOW (ref >90)
GFR calc non Af Amer: 12 mL/min — ABNORMAL LOW (ref >90)
Glucose, Bld: 105 mg/dL — ABNORMAL HIGH (ref 70–99)
Potassium: 4 mmol/L (ref 3.5–5.1)
Sodium: 144 mmol/L (ref 135–145)

## 2014-06-06 LAB — GLUCOSE, CAPILLARY
Glucose-Capillary: 107 mg/dL — ABNORMAL HIGH (ref 70–99)
Glucose-Capillary: 119 mg/dL — ABNORMAL HIGH (ref 70–99)
Glucose-Capillary: 88 mg/dL (ref 70–99)
Glucose-Capillary: 91 mg/dL (ref 70–99)
Glucose-Capillary: 95 mg/dL (ref 70–99)
Glucose-Capillary: 96 mg/dL (ref 70–99)

## 2014-06-06 LAB — MAGNESIUM: Magnesium: 2 mg/dL (ref 1.5–2.5)

## 2014-06-06 MED ORDER — MIDAZOLAM HCL 2 MG/2ML IJ SOLN
1.0000 mg | INTRAMUSCULAR | Status: DC | PRN
  Administered 2014-06-06 (×2): 1 mg via INTRAVENOUS
  Filled 2014-06-06 (×2): qty 2

## 2014-06-06 MED ORDER — SODIUM CHLORIDE 0.9 % IV SOLN
25.0000 ug/h | INTRAVENOUS | Status: DC
  Administered 2014-06-06: 300 ug/h via INTRAVENOUS
  Administered 2014-06-07: 100 ug/h via INTRAVENOUS
  Filled 2014-06-06 (×2): qty 50

## 2014-06-06 MED ORDER — SODIUM CHLORIDE 0.9 % IV SOLN
1.0000 mg/h | INTRAVENOUS | Status: DC
  Administered 2014-06-06: 2 mg/h via INTRAVENOUS
  Filled 2014-06-06 (×2): qty 10

## 2014-06-06 NOTE — Progress Notes (Signed)
PULMONARY / CRITICAL CARE MEDICINE   Name: Barry Lawrence MRN: [place]:[id number] DOB: December 04, 1945    ADMISSION DATE:  05/30/2014 CONSULTATION DATE:  05/30/2014  REFERRING MD :  [name]   CHIEF COMPLAINT:  Vent management   INITIAL PRESENTATION:  69 yo male smoker for repair of AAA.  Remained on vent post-op.   PMHx of CAD s/p CABG, CKD III, OSA on CPAP, DM, HTN, mild COPD.  STUDIES:  None  SIGNIFICANT EVENTS: 1/07 OR>> elective repair AAA 1/10 transfuse 2 units PRBC, renal consulted  SUBJECTIVE:  Was started on CVVHD 1/13, some volume removal tolerated A flutter w tachycardia o/n, hypotension Fentanyl was up to 400, currently at 100  VITAL SIGNS: Temp:  [97.7 F (36.5 C)-101.7 F (38.7 C)] 97.9 F (36.6 C) (01/14 0400) Pulse Rate:  [44-119] 73 (01/14 0700) Resp:  [12-27] 22 (01/14 0700) BP: (97-200)/(55-100) 110/66 mmHg (01/14 0700) SpO2:  [91 %-100 %] 100 % (01/14 0700) Arterial Line BP: (119-251)/(53-96) 126/56 mmHg (01/14 0700) FiO2 (%):  [40 %] 40 % (01/14 0700) Weight:  [106.9 kg (235 lb 10.8 oz)] 106.9 kg (235 lb 10.8 oz) (01/14 0402) HEMODYNAMICS: CVP:  [3 mmHg-17 mmHg] 11 mmHg VENTILATOR SETTINGS: Vent Mode:  [-] PRVC FiO2 (%):  [40 %] 40 % Set Rate:  [22 bmp] 22 bmp Vt Set:  [530 mL] 530 mL PEEP:  [5 cmH20] 5 cmH20 Pressure Support:  [16 cmH20] 16 cmH20 Plateau Pressure:  [24 cmH20-30 cmH20] 24 cmH20 INTAKE / OUTPUT:  Intake/Output Summary (Last 24 hours) at 06/06/14 0810 Last data filed at 06/06/14 0700  Gross per 24 hour  Intake   1255 ml  Output   2542 ml  Net  -1287 ml    PHYSICAL EXAMINATION: General: no distress Neuro: RASS -1, becomes agitated to stimulation, tries to speak, moves all extremities HEENT: ETT in place Cardiovascular: regular Lungs: focal R exp wheeze, L clear Abdomen: soft, wound site clean Musculoskeletal: 1+ edema  Skin: no rashes  LABS:  CBC  Recent Labs Lab 06/04/14 0459 06/05/14 0729 06/06/14 0410  WBC 9.5 9.1 8.1   HGB 9.8* 10.2* 10.4*  HCT 29.2* 30.0* 31.2*  PLT 133* 184 197     Coag's  Recent Labs Lab 05/30/14 1733 05/30/14 2040 05/31/14 0430 06/04/14 0459  APTT 39* 36 39*  --   INR 1.58* 1.42 1.46 1.21   BMET  Recent Labs Lab 06/05/14 0729 06/05/14 1530 06/06/14 0410  NA 141 147* 144  144  K 3.5 4.0 4.0  4.0  CL 106 111 110  110  CO2 23 24 26  27   BUN 84* 92* 63*  63*  CREATININE 6.81* 7.14* 4.59*  4.58*  GLUCOSE 106* 110* 105*  107*   Electrolytes  Recent Labs Lab 05/31/14 0430  06/01/14 0318  06/04/14 0459 06/05/14 0729 06/05/14 1530 06/06/14 0410  CALCIUM 6.8*  < > 6.5*  < > 7.7* 8.0* 8.3* 8.2*  8.2*  MG 1.8  --  1.9  --   --   --   --  2.0  PHOS  --   --  5.2*  < > 5.2*  --  6.4* 4.5  4.4  < > = values in this interval not displayed.   Sepsis Markers  Recent Labs Lab 05/30/14 1744  LATICACIDVEN 2.6*   ABG  Recent Labs Lab 06/01/14 1624 06/02/14 0338 06/03/14 0350  PHART 7.271* 7.356 7.423  PCO2ART 44.1 42.9 35.5  PO2ART 78.0* 83.0 80.0   Liver Enzymes  Recent Labs Lab 05/31/14 0430 06/01/14 0318  06/04/14 0459 06/05/14 1530 06/06/14 0410  AST 139* 211*  --   --   --   --   ALT 45 71*  --   --   --   --   ALKPHOS 26* 29*  --   --   --   --   BILITOT 1.0 0.7  --   --   --   --   ALBUMIN 2.8* 2.3*  < > 1.8* 1.7* 1.8*  < > = values in this interval not displayed.  Glucose  Recent Labs Lab 06/05/14 0827 06/05/14 1240 06/05/14 1606 06/05/14 1926 06/05/14 2332 06/06/14 0353  GLUCAP 100* 98 99 113* 100* 88    Imaging Dg Chest Port 1 View  06/05/2014   CLINICAL DATA:  Post left IJ dialysis catheter insertion.  EXAM: PORTABLE CHEST - 1 VIEW  COMPARISON:  06/05/2014  FINDINGS: Left dialysis catheter placement with the tip in the SVC. Endotracheal tube, right central line, and NG tube are unchanged. There is cardiomegaly with vascular congestion. Bilateral lower lobe airspace opacities are again noted, stable. Possible small  left effusion, stable. No right effusion.  IMPRESSION: Left dialysis catheter placement with the tip in the SVC. No pneumothorax. Otherwise no real change.   Electronically Signed   By: Rolm Baptise M.D.   On: 06/05/2014 15:06   Dg Chest Port 1 View  06/05/2014   CLINICAL DATA:  Intubated  EXAM: PORTABLE CHEST - 1 VIEW  COMPARISON:  06/04/2014  FINDINGS: Cardiomediastinal silhouette is stable. Again noted status post median sternotomy and CABG. Endotracheal tube in place with tip 4.5 cm above the carina. Stable NG tube position. Persistent small left pleural effusion with left basilar atelectasis or infiltrate. Central mild vascular congestion without convincing pulmonary edema. Right IJ central line is unchanged in position.  IMPRESSION: Stable support apparatus. Persistent small left pleural effusion with left basilar atelectasis or infiltrate. Central mild vascular congestion without convincing pulmonary edema.   Electronically Signed   By: Lahoma Crocker M.D.   On: 06/05/2014 09:01     ASSESSMENT / PLAN:  PULMONARY OETT 01/07 >>> A: Acute respiratory failure with hypoxia following surgery for AAA repair. Pulmonary edema - improving 1/12 Hx of OSA, COPD. P:   Continue full vent support, work towards PSV when MS will allow. Volume status is an issue Fio2 to keep Sao2 > 92% F/u CXR, ABG PRN BD's  CARDIOVASCULAR L radial a line 01/07 >>> R Swan 01/07 >>> 1/09 Rt IJ CVL through cordis 1/09 >> A:  Hypovolemic/hemorrhagic shock after AAA repair. Hx of CAD s/p CABG, HTN, HLD, PAD. P:  Pressors off Goal CVP 8 to 12 Post-op care per VVS Restarted carvedolol and amlodipine 1/13 Hold outpatient atorvastatin, ezetimibe, fenofibrate, hyzaar   RENAL A:   AKI in setting of hypovolemic/hemorrhagic shock >> Urine Na 19, Urine creatinine 160 from 1/09. Non anion gap metabolic acidosis >> improved. CKD III >> baseline creatinine 1.8 to 2.1. P:   Monitor renal fx, urine outpt Appreciate Dr  Jason Nest assistance; CVVHD running, goal restart some UF today 1/14. His UOP has picked up with addition of lasix 80mg  q8h. Could consider d/c CVVHD and follow UOP.    GASTROINTESTINAL A:   Nutrition. Hx of GERD. P:   TF's running Protonix for SUP  HEMATOLOGIC A:  Acute blood loss anemia. Thrombocytopenia >> PLT 207 from 05/22/14. P: F/u CBC Transfuse for Hb < 7 SCD for DVT prevention  INFECTIOUS A:   Surgical prophylaxis >> off Abx 1/08. P:   Monitor off Abx sputum cx 1/10 >> negative  ENDOCRINE A:   No acute issues. P:   Monitor blood sugars  NEUROLOGIC A:   Acute metabolic encephalopathy. P:   RASS goal 0, currently -1 off versed gtt on 1/12 Continue fentanyl gtt but decrease ceiling from 400 to 100  Inter-disciplinary family meet or Palliative Care meeting due by:  1/24   CC time 35 minutes.  Baltazar Apo, MD, PhD 06/06/2014, 8:10 AM [facility] [phone] or if no answer [phone]

## 2014-06-06 NOTE — Progress Notes (Signed)
[facility] ROUNDING NOTE   Subjective:   Interval History: some urine output last night  Objective:  Vital signs in last 24 hours:  Temp:  [96.9 F (36.1 C)-101.7 F (38.7 C)] 96.9 F (36.1 C) (01/14 0700) Pulse Rate:  [44-110] 92 (01/14 1000) Resp:  [12-27] 26 (01/14 1000) BP: (97-178)/(55-91) 143/74 mmHg (01/14 1000) SpO2:  [93 %-100 %] 94 % (01/14 1000) Arterial Line BP: (119-236)/(53-92) 176/77 mmHg (01/14 1000) FiO2 (%):  [40 %] 40 % (01/14 0854) Weight:  [106.9 kg (235 lb 10.8 oz)] 106.9 kg (235 lb 10.8 oz) (01/14 0402)  Weight change: -2 kg (-4 lb 6.6 oz) Filed Weights   06/04/14 0500 06/05/14 0500 06/06/14 0402  Weight: 108.2 kg (238 lb 8.6 oz) 108.9 kg (240 lb 1.3 oz) 106.9 kg (235 lb 10.8 oz)    Intake/Output: I/O last 3 completed shifts: In: 2114.7 [I.V.:1675; NG/GT:439.7] Out: 2967 [Urine:1935; Other:1031; Stool:1]   Intake/Output this shift:  Total I/O In: 150 [I.V.:50; NG/GT:100] Out: 554 [Urine:140; Other:414]  CVS- RRR RS- CTA ET intubated  ABD- BS present soft non-distended EXT- no edema   Basic Metabolic Panel:  Recent Labs Lab 05/30/14 1733  05/31/14 0430  06/01/14 0318  06/03/14 0345 06/04/14 0459 06/05/14 0729 06/05/14 1530 06/06/14 0410  NA 139  < > 138  < > 140  < > 141 144 141 147* 144  144  K 5.1  < > 4.0  < > 4.3  < > 3.3* 3.3* 3.5 4.0 4.0  4.0  CL 108  < > 110  < > 110  < > 108 112 106 111 110  110  CO2 23  < > 21  < > 18*  < > 23 24 23 24 26  27   GLUCOSE 178*  < > 138*  < > 123*  < > 101* 95 106* 110* 105*  107*  BUN 23  < > 27*  < > 38*  < > 51* 65* 84* 92* 63*  63*  CREATININE 2.40*  < > 2.95*  < > 4.00*  < > 5.17* 5.99* 6.81* 7.14* 4.59*  4.58*  CALCIUM 7.2*  < > 6.8*  < > 6.5*  < > 6.9* 7.7* 8.0* 8.3* 8.2*  8.2*  MG 1.5  --  1.8  --  1.9  --   --   --   --   --  2.0  PHOS  --   --   --   --  5.2*  --  4.8* 5.2*  --  6.4* 4.5  4.4  < > = values in this interval not displayed.  Liver Function  Tests:  Recent Labs Lab 05/31/14 0430 06/01/14 0318 06/03/14 0345 06/04/14 0459 06/05/14 1530 06/06/14 0410  AST 139* 211*  --   --   --   --   ALT 45 71*  --   --   --   --   ALKPHOS 26* 29*  --   --   --   --   BILITOT 1.0 0.7  --   --   --   --   PROT 4.2* 4.2*  --   --   --   --   ALBUMIN 2.8* 2.3* 1.6* 1.8* 1.7* 1.8*    Recent Labs Lab 05/31/14 0430  AMYLASE 59   No results for input(s): AMMONIA in the last 168 hours.  CBC:  Recent Labs Lab 06/02/14 0330 06/03/14 0345 06/04/14 0459 06/05/14 0729 06/06/14 0410  WBC 9.4 8.3 9.5 9.1 8.1  HGB 6.9* 7.8* 9.8* 10.2* 10.4*  HCT 20.4* 22.1* 29.2* 30.0* 31.2*  MCV 92.3 89.5 87.4 89.8 91.2  PLT 79* 97* 133* 184 197    Cardiac Enzymes:  Recent Labs Lab 05/30/14 1744  CKTOTAL 328*    BNP: Invalid input(s): POCBNP  CBG:  Recent Labs Lab 06/05/14 1606 06/05/14 1926 06/05/14 2332 06/06/14 0353 06/06/14 0822  GLUCAP 99 113* 100* 88 96    Microbiology: Results for orders placed or performed during the hospital encounter of 05/30/14  Culture, respiratory (NON-Expectorated)     Status: None   Collection Time: 06/02/14 12:43 PM  Result Value Ref Range Status   Specimen Description ENDOTRACHEAL  Final   Special Requests NONE  Final   Gram Stain   Final    RARE WBC PRESENT,BOTH PMN AND MONONUCLEAR NO SQUAMOUS EPITHELIAL CELLS SEEN NO ORGANISMS SEEN Performed at [facility]    Culture   Final    NO GROWTH 2 DAYS Performed at [facility]    Report Status 06/04/2014 FINAL  Final    Coagulation Studies:  Recent Labs  06/04/14 0459  LABPROT 15.4*  INR 1.21    Urinalysis: No results for input(s): COLORURINE, LABSPEC, PHURINE, GLUCOSEU, HGBUR, BILIRUBINUR, KETONESUR, PROTEINUR, UROBILINOGEN, NITRITE, LEUKOCYTESUR in the last 72 hours.  Invalid input(s): APPERANCEUR    Imaging: Dg Chest Port 1 View  06/06/2014   CLINICAL DATA:  69 year old male with respiratory failure,  recent operative aortobifemoral bypass graft, left renal artery bypass. Initial encounter.  EXAM: PORTABLE CHEST - 1 VIEW  COMPARISON:  06/05/2014 and earlier.  FINDINGS: Portable AP semi upright view at 0628 hrs. Stable endotracheal tube. Stable bilateral are IJ vascular catheters. Enteric tube courses to the abdomen, tip not included.  Stable lung volumes. Stable cardiac size and mediastinal contours. Sequelae of CABG. No pneumothorax. Continued confluent retrocardiac opacity with no large pleural effusion. Superimposed perihilar basilar predominant streaky opacity, no overt edema.  IMPRESSION: 1.  Stable lines and tubes. 2. Stable ventilation with streaky perihilar opacity superimposed on lower lobe collapse or consolidation.   Electronically Signed   By: Lars Pinks M.D.   On: 06/06/2014 07:46   Dg Chest Port 1 View  06/05/2014   CLINICAL DATA:  Post left IJ dialysis catheter insertion.  EXAM: PORTABLE CHEST - 1 VIEW  COMPARISON:  06/05/2014  FINDINGS: Left dialysis catheter placement with the tip in the SVC. Endotracheal tube, right central line, and NG tube are unchanged. There is cardiomegaly with vascular congestion. Bilateral lower lobe airspace opacities are again noted, stable. Possible small left effusion, stable. No right effusion.  IMPRESSION: Left dialysis catheter placement with the tip in the SVC. No pneumothorax. Otherwise no real change.   Electronically Signed   By: Rolm Baptise M.D.   On: 06/05/2014 15:06   Dg Chest Port 1 View  06/05/2014   CLINICAL DATA:  Intubated  EXAM: PORTABLE CHEST - 1 VIEW  COMPARISON:  06/04/2014  FINDINGS: Cardiomediastinal silhouette is stable. Again noted status post median sternotomy and CABG. Endotracheal tube in place with tip 4.5 cm above the carina. Stable NG tube position. Persistent small left pleural effusion with left basilar atelectasis or infiltrate. Central mild vascular congestion without convincing pulmonary edema. Right IJ central line is unchanged  in position.  IMPRESSION: Stable support apparatus. Persistent small left pleural effusion with left basilar atelectasis or infiltrate. Central mild vascular congestion without convincing pulmonary edema.   Electronically Signed  By: Lahoma Crocker M.D.   On: 06/05/2014 09:01   Dg Abd Portable 1v  06/04/2014   CLINICAL DATA:  Gastric tube placement  EXAM: PORTABLE ABDOMEN - 1 VIEW  COMPARISON:  Portable exam 1854 hr compared to CT abdomen and pelvis 03/05/2014  FINDINGS: Tip of gastric tube projects over expected position of the proximal descending duodenum.  Bowel gas pattern normal.  Prior median sternotomy.  Opacified LEFT lung base.  Osseous demineralization.  IMPRESSION: Tip of gastric tube projects over expected position of the proximal descending duodenum.   Electronically Signed   By: Lavonia Dana M.D.   On: 06/04/2014 19:24     Medications:   . sodium chloride 10 mL/hr at 06/06/14 1000  . fentaNYL infusion INTRAVENOUS 50 mcg/hr (06/06/14 1000)  . dialysis replacement fluid (prismasate) 200 mL/hr at 06/05/14 1517  . dialysis replacement fluid (prismasate) 300 mL/hr at 06/06/14 0912  . dialysate (PRISMASATE) 2,000 mL/hr at 06/06/14 0825   . sodium chloride   Intravenous Once  . amLODipine  10 mg Per Tube QAC breakfast  . antiseptic oral rinse  7 mL Mouth Rinse QID  . carvedilol  6.25 mg Per Tube BID  . chlorhexidine  15 mL Mouth Rinse BID  . enoxaparin (LOVENOX) injection  30 mg Subcutaneous Daily  . feeding supplement (VITAL HIGH PROTEIN)  1,000 mL Per Tube Q24H  . furosemide  80 mg Intravenous Q6H  . insulin aspart  0-9 Units Subcutaneous 6 times per day  . pantoprazole (PROTONIX) IV  40 mg Intravenous Q24H   acetaminophen **OR** acetaminophen, albuterol, fentaNYL, heparin, hydrALAZINE, midazolam, ondansetron, sodium chloride  Assessment/ Plan:  1. Acute renal failure on chronic kidney disease stage III: Available database, history and timeline of events points to ischemic ATN  postoperatively Continue to limit nephrotoxin exposure such as iodinated contrast and nonsteroidals .  will follow  2. Anemia: using ESA 3. 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 VDRF: Ventilator management per CCM  Will continue with CRRT no changes to prescription    LOS: 7 Vin Yonke W @TODAY @10 :17 AM

## 2014-06-06 NOTE — Progress Notes (Signed)
[facility] Progress Note Patient Name: Barry Lawrence DOB: Aug 10, 1945 MRN: [place]:[id number]   Date of Service  06/06/2014  HPI/Events of Note  Monitor suggests afib with rate < 100  eICU Interventions  Check ekg     Intervention Category Major Interventions: Arrhythmia - evaluation and management  Christinia Gully 06/06/2014, 9:28 PM

## 2014-06-06 NOTE — Progress Notes (Addendum)
   [facility]  Subjective  - Still intubated, but follows commands to move and responds to yes/no questions with head movement.   Objective 110/66 73 97.9 F (36.6 C) (Oral) 22 100%  Intake/Output Summary (Last 24 hours) at 06/06/14 0724 Last data filed at 06/06/14 0700  Gross per 24 hour  Intake   1315 ml  Output   2572 ml  Net  -1257 ml    Feet warm well perfused.  AROM of toes and ankles to command. Abdomin distended, but soft.  Positive BM yesterday. Heart irregular with flutter. Lungs intubated, wheezing  Assessment/Planning: POD # 7 aortobifemoral bypass graft for an abdominal aortic aneurysm. Also status post left renal artery bypass  DVT SCD's Lovenox CRRT started yesterday CR was 7.14 at it's highest, now 4.58 will continue CRRT per DR. Webb Lasix given times 2 doses of 80 Output 20-70 cc/hr Intubated sedation off increased response Abdomin distended positive BM yesterday A flutter on monitor, BP increased to systolic [id number] CCM started Norvasc and coreg BP stable 110/66 now  [facility] 06/06/2014 7:24 AM --  Laboratory Lab Results:  Recent Labs  06/05/14 0729 06/06/14 0410  WBC 9.1 8.1  HGB 10.2* 10.4*  HCT 30.0* 31.2*  PLT 184 197   BMET  Recent Labs  06/05/14 1530 06/06/14 0410  NA 147* 144  144  K 4.0 4.0  4.0  CL 111 110  110  CO2 24 26  27   GLUCOSE 110* 105*  107*  BUN 92* 63*  63*  CREATININE 7.14* 4.59*  4.58*  CALCIUM 8.3* 8.2*  8.2*    COAG Lab Results  Component Value Date   INR 1.21 06/04/2014   INR 1.46 05/31/2014   INR 1.42 05/30/2014   No results found for: PTT    I agree with the above.  CVVHD started yesterday.  Sedation decreased. He is alert at times.  Vent weaned.  Hopefully can extubate soon.  Wife updated.  Annamarie Major

## 2014-06-06 NOTE — Progress Notes (Signed)
UR Completed.  [phone]  

## 2014-06-07 DIAGNOSIS — I4891 Unspecified atrial fibrillation: Secondary | ICD-10-CM

## 2014-06-07 LAB — RENAL FUNCTION PANEL
Albumin: 1.8 g/dL — ABNORMAL LOW (ref 3.5–5.2)
Albumin: 1.9 g/dL — ABNORMAL LOW (ref 3.5–5.2)
Anion gap: 6 (ref 5–15)
Anion gap: 10 (ref 5–15)
BUN: 42 mg/dL — ABNORMAL HIGH (ref 6–23)
BUN: 42 mg/dL — ABNORMAL HIGH (ref 6–23)
CO2: 28 mmol/L (ref 19–32)
CO2: 25 mmol/L (ref 19–32)
Calcium: 8 mg/dL — ABNORMAL LOW (ref 8.4–10.5)
Calcium: 8.2 mg/dL — ABNORMAL LOW (ref 8.4–10.5)
Chloride: 106 mEq/L (ref 96–112)
Chloride: 103 mEq/L (ref 96–112)
Creatinine, Ser: 2.92 mg/dL — ABNORMAL HIGH (ref 0.50–1.35)
Creatinine, Ser: 2.92 mg/dL — ABNORMAL HIGH (ref 0.50–1.35)
GFR calc Af Amer: 24 mL/min — ABNORMAL LOW (ref >90)
GFR calc Af Amer: 24 mL/min — ABNORMAL LOW (ref >90)
GFR calc non Af Amer: 21 mL/min — ABNORMAL LOW (ref >90)
GFR calc non Af Amer: 21 mL/min — ABNORMAL LOW (ref >90)
Glucose, Bld: 94 mg/dL (ref 70–99)
Glucose, Bld: 106 mg/dL — ABNORMAL HIGH (ref 70–99)
Phosphorus: 3.6 mg/dL (ref 2.3–4.6)
Phosphorus: 3.4 mg/dL (ref 2.3–4.6)
Potassium: 4 mmol/L (ref 3.5–5.1)
Potassium: 3.8 mmol/L (ref 3.5–5.1)
Sodium: 140 mmol/L (ref 135–145)
Sodium: 138 mmol/L (ref 135–145)

## 2014-06-07 LAB — GLUCOSE, CAPILLARY
Glucose-Capillary: 85 mg/dL (ref 70–99)
Glucose-Capillary: 87 mg/dL (ref 70–99)
Glucose-Capillary: 98 mg/dL (ref 70–99)
Glucose-Capillary: 106 mg/dL — ABNORMAL HIGH (ref 70–99)
Glucose-Capillary: 90 mg/dL (ref 70–99)

## 2014-06-07 LAB — POCT I-STAT 3, ART BLOOD GAS (G3+)
Bicarbonate: 24.5 mEq/L — ABNORMAL HIGH (ref 20.0–24.0)
O2 Saturation: 93 %
Patient temperature: 98.4
TCO2: 26 mmol/L (ref 0–100)
pCO2 arterial: 40.6 mmHg (ref 35.0–45.0)
pH, Arterial: 7.389 (ref 7.350–7.450)
pO2, Arterial: 67 mmHg — ABNORMAL LOW (ref 80.0–100.0)

## 2014-06-07 LAB — CBC
HCT: 30.9 % — ABNORMAL LOW (ref 39.0–52.0)
Hemoglobin: 10.2 g/dL — ABNORMAL LOW (ref 13.0–17.0)
MCH: 29.7 pg (ref 26.0–34.0)
MCHC: 33 g/dL (ref 30.0–36.0)
MCV: 90.1 fL (ref 78.0–100.0)
Platelets: 222 10*3/uL (ref 150–400)
RBC: 3.43 MIL/uL — ABNORMAL LOW (ref 4.22–5.81)
RDW: 15.8 % — ABNORMAL HIGH (ref 11.5–15.5)
WBC: 7.4 10*3/uL (ref 4.0–10.5)

## 2014-06-07 MED ORDER — LORAZEPAM 2 MG/ML IJ SOLN
1.0000 mg | Freq: Once | INTRAMUSCULAR | Status: AC
  Administered 2014-06-07: 1 mg via INTRAVENOUS
  Filled 2014-06-07: qty 1

## 2014-06-07 MED ORDER — FENTANYL CITRATE 0.05 MG/ML IJ SOLN
25.0000 ug | INTRAMUSCULAR | Status: DC | PRN
  Administered 2014-06-07 – 2014-06-09 (×5): 75 ug via INTRAVENOUS
  Filled 2014-06-07 (×6): qty 2

## 2014-06-07 NOTE — Progress Notes (Signed)
200cc fentanyl and 15cc versed wasted in sink and flushed; witnessed by Sula Soda, RN.

## 2014-06-07 NOTE — Progress Notes (Signed)
    Subjective  - POD #7, s/p ABF with left renal bypass  Extubated today and agitated CVVHD suspended   Physical Exam:  Communicative Breathing comfortably Extremities with doppler signals Abd soft, still a little distended       Assessment/Plan:  POD #7  GI:  Will need speech eval to assess swallowing.  If he passes, he can try clears.  OK to d/c NG.  Cold potentially start PO meds GU:  Off CVVHD, still making urine, will monitor for need of HD.  Renal following Prophylaxis:  Lovenox Pulm:  Extubated today ID :  No infectious issues currently Wounds:  OK, monitor groin wounds and keep dry CV:  Off pressors.  AFIB rate ok.  Cardiology following.  OK to start anticoagulation, do not bolus heparin Neuro:  Stable.  Remains somewhat agitated, but no focal deficits Acute blood loss anemia:  Hb stable  Cochise Dinneen IV, V. WELLS 06/07/2014 4:38 PM --  Filed Vitals:   06/07/14 1600  BP: 143/68  Pulse: 95  Temp:   Resp:     Intake/Output Summary (Last 24 hours) at 06/07/14 1638 Last data filed at 06/07/14 1600  Gross per 24 hour  Intake   1039 ml  Output   3036 ml  Net  -1997 ml     Laboratory CBC    Component Value Date/Time   WBC 7.4 06/07/2014 0410   HGB 10.2* 06/07/2014 0410   HCT 30.9* 06/07/2014 0410   PLT 222 06/07/2014 0410    BMET    Component Value Date/Time   NA 140 06/07/2014 0410   K 4.0 06/07/2014 0410   CL 106 06/07/2014 0410   CO2 28 06/07/2014 0410   GLUCOSE 94 06/07/2014 0410   BUN 42* 06/07/2014 0410   CREATININE 2.92* 06/07/2014 0410   CREATININE 2.02* 05/03/2014 0829   CALCIUM 8.0* 06/07/2014 0410   GFRNONAA 21* 06/07/2014 0410   GFRAA 24* 06/07/2014 0410    COAG Lab Results  Component Value Date   INR 1.21 06/04/2014   INR 1.46 05/31/2014   INR 1.42 05/30/2014   No results found for: PTT  Antibiotics Anti-infectives    Start     Dose/Rate Route Frequency Ordered Stop   05/31/14 0100  cefUROXime (ZINACEF) 1.5 g in  dextrose 5 % 50 mL IVPB     1.5 g100 mL/hr over 30 Minutes Intravenous Every 12 hours 05/30/14 1732 05/31/14 1235   05/30/14 1330  cefUROXime (ZINACEF) 1.5 g in dextrose 5 % 50 mL IVPB  Status:  Discontinued     1.5 g100 mL/hr over 30 Minutes Intravenous To Surgery 05/30/14 1326 05/30/14 1716   05/29/14 1455  cefUROXime (ZINACEF) 1.5 g in dextrose 5 % 50 mL IVPB     1.5 g100 mL/hr over 30 Minutes Intravenous 30 min pre-op 05/29/14 1455 05/30/14 1343       V. Leia Alf, M.D. [facility] [place] Office: [phone] Pager:  [phone]

## 2014-06-07 NOTE — Progress Notes (Signed)
[facility] ROUNDING NOTE   Subjective:   Interval History: hoping to be extubated and quite agitated  Objective:  Vital signs in last 24 hours:  Temp:  [97.6 F (36.4 C)-99.6 F (37.6 C)] 97.6 F (36.4 C) (01/15 1200) Pulse Rate:  [65-104] 75 (01/15 1200) Resp:  [15-25] 16 (01/15 1200) BP: (97-169)/(53-100) 117/61 mmHg (01/15 1200) SpO2:  [95 %-100 %] 98 % (01/15 1200) Arterial Line BP: (122-208)/(60-86) 155/65 mmHg (01/15 1200) FiO2 (%):  [40 %] 40 % (01/15 1200) Weight:  [104.6 kg (230 lb 9.6 oz)] 104.6 kg (230 lb 9.6 oz) (01/15 0304)  Weight change: -2.3 kg (-5 lb 1.1 oz) Filed Weights   06/05/14 0500 06/06/14 0402 06/07/14 0304  Weight: 108.9 kg (240 lb 1.3 oz) 106.9 kg (235 lb 10.8 oz) 104.6 kg (230 lb 9.6 oz)    Intake/Output: I/O last 3 completed shifts: In: 1908.5 [I.V.:1388.5; NG/GT:520] Out: [id number] [Urine:2370; QB:[id number]; Stool:1]   Intake/Output this shift:  Total I/O In: 198 [I.V.:88; NG/GT:110] Out: 890 [Urine:345; Other:545]  CVS- RRR RS- CTA ABD- BS present soft non-distended EXT- no edema   Basic Metabolic Panel:  Recent Labs Lab 06/01/14 0318  06/04/14 0459 06/05/14 0729 06/05/14 1530 06/06/14 0410 06/06/14 1533 06/07/14 0410  NA 140  < > 144 141 147* 144  144 142 140  K 4.3  < > 3.3* 3.5 4.0 4.0  4.0 4.1 4.0  CL 110  < > 112 106 111 110  110 106 106  CO2 18*  < > 24 23 24 26  27 29 28   GLUCOSE 123*  < > 95 106* 110* 105*  107* 123* 94  BUN 38*  < > 65* 84* 92* 63*  63* 48* 42*  CREATININE 4.00*  < > 5.99* 6.81* 7.14* 4.59*  4.58* 3.54* 2.92*  CALCIUM 6.5*  < > 7.7* 8.0* 8.3* 8.2*  8.2* 8.2* 8.0*  MG 1.9  --   --   --   --  2.0  --   --   PHOS 5.2*  < > 5.2*  --  6.4* 4.5  4.4 4.9* 3.6  < > = values in this interval not displayed.  Liver Function Tests:  Recent Labs Lab 06/01/14 0318  06/04/14 0459 06/05/14 1530 06/06/14 0410 06/06/14 1533 06/07/14 0410  AST 211*  --   --   --   --   --   --   ALT  71*  --   --   --   --   --   --   ALKPHOS 29*  --   --   --   --   --   --   BILITOT 0.7  --   --   --   --   --   --   PROT 4.2*  --   --   --   --   --   --   ALBUMIN 2.3*  < > 1.8* 1.7* 1.8* 1.9* 1.8*  < > = values in this interval not displayed. No results for input(s): LIPASE, AMYLASE in the last 168 hours. No results for input(s): AMMONIA in the last 168 hours.  CBC:  Recent Labs Lab 06/03/14 0345 06/04/14 0459 06/05/14 0729 06/06/14 0410 06/07/14 0410  WBC 8.3 9.5 9.1 8.1 7.4  HGB 7.8* 9.8* 10.2* 10.4* 10.2*  HCT 22.1* 29.2* 30.0* 31.2* 30.9*  MCV 89.5 87.4 89.8 91.2 90.1  PLT 97* 133* 184 197 222    Cardiac Enzymes:  No results for input(s): CKTOTAL, CKMB, CKMBINDEX, TROPONINI in the last 168 hours.  BNP: Invalid input(s): POCBNP  CBG:  Recent Labs Lab 06/06/14 1939 06/06/14 2346 06/07/14 0353 06/07/14 0829 06/07/14 1237  GLUCAP 95 91 85 87 98    Microbiology: Results for orders placed or performed during the hospital encounter of 05/30/14  Culture, respiratory (NON-Expectorated)     Status: None   Collection Time: 06/02/14 12:43 PM  Result Value Ref Range Status   Specimen Description ENDOTRACHEAL  Final   Special Requests NONE  Final   Gram Stain   Final    RARE WBC PRESENT,BOTH PMN AND MONONUCLEAR NO SQUAMOUS EPITHELIAL CELLS SEEN NO ORGANISMS SEEN Performed at [facility]    Culture   Final    NO GROWTH 2 DAYS Performed at [facility]    Report Status 06/04/2014 FINAL  Final    Coagulation Studies: No results for input(s): LABPROT, INR in the last 72 hours.  Urinalysis: No results for input(s): COLORURINE, LABSPEC, PHURINE, GLUCOSEU, HGBUR, BILIRUBINUR, KETONESUR, PROTEINUR, UROBILINOGEN, NITRITE, LEUKOCYTESUR in the last 72 hours.  Invalid input(s): APPERANCEUR    Imaging: Dg Chest Port 1 View  06/06/2014   CLINICAL DATA:  69 year old male with respiratory failure, recent operative aortobifemoral bypass graft,  left renal artery bypass. Initial encounter.  EXAM: PORTABLE CHEST - 1 VIEW  COMPARISON:  06/05/2014 and earlier.  FINDINGS: Portable AP semi upright view at 0628 hrs. Stable endotracheal tube. Stable bilateral are IJ vascular catheters. Enteric tube courses to the abdomen, tip not included.  Stable lung volumes. Stable cardiac size and mediastinal contours. Sequelae of CABG. No pneumothorax. Continued confluent retrocardiac opacity with no large pleural effusion. Superimposed perihilar basilar predominant streaky opacity, no overt edema.  IMPRESSION: 1.  Stable lines and tubes. 2. Stable ventilation with streaky perihilar opacity superimposed on lower lobe collapse or consolidation.   Electronically Signed   By: Lars Pinks M.D.   On: 06/06/2014 07:46   Dg Chest Port 1 View  06/05/2014   CLINICAL DATA:  Post left IJ dialysis catheter insertion.  EXAM: PORTABLE CHEST - 1 VIEW  COMPARISON:  06/05/2014  FINDINGS: Left dialysis catheter placement with the tip in the SVC. Endotracheal tube, right central line, and NG tube are unchanged. There is cardiomegaly with vascular congestion. Bilateral lower lobe airspace opacities are again noted, stable. Possible small left effusion, stable. No right effusion.  IMPRESSION: Left dialysis catheter placement with the tip in the SVC. No pneumothorax. Otherwise no real change.   Electronically Signed   By: Rolm Baptise M.D.   On: 06/05/2014 15:06     Medications:   . sodium chloride Stopped (06/07/14 0800)  . fentaNYL infusion INTRAVENOUS 100 mcg/hr (06/07/14 0930)  . midazolam (VERSED) infusion Stopped (06/07/14 0730)  . dialysis replacement fluid (prismasate) 200 mL/hr at 06/06/14 1707  . dialysis replacement fluid (prismasate) 300 mL/hr at 06/07/14 0326  . dialysate (PRISMASATE) 2,000 mL/hr at 06/07/14 0830   . sodium chloride   Intravenous Once  . amLODipine  10 mg Per Tube QAC breakfast  . antiseptic oral rinse  7 mL Mouth Rinse QID  . carvedilol  6.25 mg Per  Tube BID  . chlorhexidine  15 mL Mouth Rinse BID  . enoxaparin (LOVENOX) injection  30 mg Subcutaneous Daily  . feeding supplement (VITAL HIGH PROTEIN)  1,000 mL Per Tube Q24H  . furosemide  80 mg Intravenous Q6H  . insulin aspart  0-9 Units Subcutaneous 6 times per  day  . pantoprazole (PROTONIX) IV  40 mg Intravenous Q24H   acetaminophen **OR** acetaminophen, albuterol, heparin, hydrALAZINE, ondansetron, sodium chloride  Assessment/ Plan:  1. Acute renal failure on chronic kidney disease stage III: Available database, history and timeline of events points to ischemic ATN postoperatively Continue to limit nephrotoxin exposure such as iodinated contrast and non steroidals.  2. Anemia: using ESA 3. 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 VDRF: Ventilator management per CCM  Will stop CRRT today and assess for recovery  LOS: 8 Ellamarie Naeve W @TODAY @1 :04 PM

## 2014-06-07 NOTE — Procedures (Signed)
Extubation Procedure Note  Patient Details:   Name: Barry Lawrence DOB: May 14, 1946 MRN: [place]:[id number]   Airway Documentation:     Evaluation  O2 sats: stable throughout Complications: No apparent complications Patient did tolerate procedure well. Bilateral Breath Sounds: Clear, Diminished Suctioning: Oral, Airway Yes   Pt extubated to 3L [place] with no complication and was stable throughout. Pt was attempting to talk despite the ett in place all morning while RN and RT reminded him that he was not able to speak at this time and continuing to do so may cause issues later on for him. Pt was confused post extubation asking why we had been doing this to him and why he had a tube in his throat. RN explained it to him once again. RT will continue to monitor Pt.    Jesse Sans 06/07/2014, 2:34 PM

## 2014-06-07 NOTE — Progress Notes (Signed)
Pt  agitated and attempting to talk with ETT in place.  Despite requests from RN and MD, pt continues trying to talk. Fentanyl increased per order.  Will continue to educate and monitor.

## 2014-06-07 NOTE — Progress Notes (Signed)
[facility] Progress Note Patient Name: NAHZIR SCHULT DOB: [birth year] MRN: [place]:[id number]   Date of Service  06/07/2014  HPI/Events of Note  Kicking Rn, harm to self and others  eICU Interventions  Add ankles resatrints     Intervention Category Minor Interventions: Agitation / anxiety - evaluation and management  Raylene Miyamoto. 06/07/2014, 3:23 PM

## 2014-06-07 NOTE — Progress Notes (Signed)
Pt became agitated with turning to clean after bowel movement.  Pt kicking RNs and attempting to get out of bed and dislodge tubes and lines.  Dr. Titus Mould made aware.  Ankle restraints applied per order to protect patient and others.  Family currently not at bedside; wife in ED being seen.  Will continue to monitor.

## 2014-06-07 NOTE — Consult Note (Signed)
Primary cardiologist: Dr Gwenlyn Found  HPI: 69 yo male with PMH CAD s/p CABG, renal insufficiency and now s/p aortobifemoral bypass, left renal artery bypass for evaluation of postoperative atrial fibrillation. Last echocardiogram October 2014 showed normal LV function, grade 2 diastolic dysfunction, mild aortic insufficiency, mild left atrial enlargement and mild tricuspid regurgitation. Nuclear study December 2015 showed an ejection fraction of 46%. There was no ischemia. Patient underwent surgery on January 8. His postoperative course has been complicated by hypovolemic shock which has improved, acute on chronic renal failure requiring CVVHD and ventilator dependence. He has been noted now to be in postoperative atrial fibrillation and cardiology is asked to evaluate. History cannot be obtained as the patient is intubated.  Medications Prior to Admission  Medication Sig Dispense Refill  . amLODipine (NORVASC) 10 MG tablet Take 10 mg by mouth daily before breakfast.     . Aspirin-Acetaminophen-Caffeine (GOODY HEADACHE PO) Take 1 packet by mouth every hour as needed (for pain).     [name] atorvastatin (LIPITOR) 80 MG tablet Take 80 mg by mouth at bedtime.     . carvedilol (COREG) 6.25 MG tablet Take 6.25 mg by mouth 2 (two) times daily with a meal.    . Cholecalciferol (VITAMIN D3) 5000 UNITS CAPS Take 5,000 Units by mouth daily.    [name] ezetimibe (ZETIA) 10 MG tablet Take 10 mg by mouth at bedtime.     . fenofibrate 160 MG tablet Take 160 mg by mouth at bedtime.     [name] losartan-hydrochlorothiazide (HYZAAR) 100-25 MG per tablet Take 1 tablet by mouth daily before breakfast.     . omeprazole (PRILOSEC) 20 MG capsule Take 20 mg by mouth at bedtime.       Allergies  Allergen Reactions  . Niaspan [Niacin Er] Hives     Past Medical History  Diagnosis Date  . CAD (coronary artery disease)     myoview 04/21/11-normal, EF49%  . S/P CABG (coronary artery bypass graft) 1996  . Hyperlipemia   . Polycythemia      H/O  . GERD (gastroesophageal reflux disease)   . Tobacco abuse   . CKD (chronic kidney disease), stage III   . Myocardial infarction 1996  . OSA on CPAP     CPAP q night , CPAP is through [place], states doesn't remember when he had the last study  . Pneumonia     hosp. as a child with pneumonia, not since   . Diabetes     BORDERLINE  . Headache     uses [employer], states he is lessening what he is taking for prep. for surgery   . Arthritis     in his back  . Hypertension     stress test- done 04/2014, followed by Dr. Gwenlyn Found  . Carotid stenosis 01/15/08    right endarterectomy-Dr. Amedeo Plenty  . PAD (peripheral artery disease) 06/12/09    a. 11/22/07 PTA & stenting right external iliac artery;bilateral iliac & PTI and stenting 1997;right ICA =>50% reduction,right SFA >50%,left ICA at stent => 50% reduction,left EIA => 50% reduction,left ATA occluded, left SFA mid > 49% reduction;  03/2014 Angio: LRA 90, patent L Iliac stent and distal RCIA stent, large saccular AAA.  [name] Abdominal aortic aneurysm 01/28/10; 02/05/14    4.3x4.6cm;  now measuring 5.5 cm    Past Surgical History  Procedure Laterality Date  . Carotid endarterectomy Right 01/15/08  . Lumbar disc surgery  1998  . Pv angio  11/22/2007  PTA/ stenting of right common iliac artery with a 10x55m Smart stent and postdilated with a [id number] balloon; high grade calcified stenosis of the RICA  . Colonoscopy N/A 07/09/2013    Procedure: COLONOSCOPY;  Surgeon: JyJuanita CraverMD;  Location: [facility];  Service: Endoscopy;  Laterality: N/A;  . Pv angio  04/01/14    AAA, Lt renal artery stenosis, patent iliacs  . Abdominal aortagram N/A 04/01/2014    Procedure: ABDOMINAL AOMaxcine Ham Surgeon: JoLorretta HarpMD;  [facility];  Service: Cardiovascular;  Laterality: N/A;  . [facility] Coronary artery bypass graft  1996    LIMA to LAD, sequential vein to OM1 and 2 as well as acure marginal branch and diag  branch  . Aorta - bilateral femoral artery bypass graft N/A 05/30/2014    Procedure: AORTOBIFEMORAL BYPASS GRAFT;  Surgeon: VaSerafina MitchellMD;  [facility];  Laterality: N/A;  . Aortic/renal bypass Left 05/30/2014    Procedure: LEFT RENAL ARTERY BYPASS;  Surgeon: VaSerafina MitchellMD;  [facility];  Laterality: Left;    History   Social History  . Marital Status: Married    Spouse Name: N/A    Number of Children: 2  . Years of Education: N/A   Occupational History  . Retired heEstate manager/land agent  Social History Main Topics  . Smoking status: Current Every Day Smoker -- 1.00 packs/day for 56 years    Types: Cigarettes  . Smokeless tobacco: Never Used  . Alcohol Use: No  . Drug Use: No  . Sexual Activity: Not on file   Other Topics Concern  . Not on file   Social History Narrative   Lives with wife.    Family History  Problem Relation Age of Onset  . Heart failure Father     ROS:  Unobtainable as patient is intubated.  Physical Exam:   Blood pressure 122/58, pulse 90, temperature 99.6 F (37.6 C), temperature source Axillary, resp. rate 17, height _0  (1.702 m), weight 230 lb 9.6 oz (104.6 kg), SpO2 96 %.  General:  Well developed/intubated Skin warm/dry No peripheral clubbing Back-not assessed HEENT-normal/normal eyelids Neck normal carotid upstroke bilaterally; central venous lines in place chest - CTA/ normal expansion anteriorly CV - RRR/normal S1 and S2; no murmurs, rubs or gallops;  PMI nondisplaced Abdomen -ND, no HSM, no mass, abdominal incision without evidence of infection 2+ femoral pulses, no bruits Ext-2+ edema, no chords, diminished distal pulses Neuro-intubated  ECG atrial fibrillation with right bundle branch block. Nonspecific ST changes.  Results for orders placed or performed during the hospital encounter of 05/30/14 (from the past 48 hour(s))  Glucose, capillary     Status: None   Collection  Time: 06/05/14 12:40 PM  Result Value Ref Range   Glucose-Capillary 98 70 - 99 mg/dL   Comment 1 Capillary Sample   Renal function panel (daily at 1600)     Status: Abnormal   Collection Time: 06/05/14  3:30 PM  Result Value Ref Range   Sodium 147 (H) 135 - 145 mmol/L    Comment: Please note change in reference range.   Potassium 4.0 3.5 - 5.1 mmol/L    Comment: Please note change in reference range.   Chloride 111 96 - 112 mEq/L   CO2 24 19 - 32 mmol/L   Glucose, Bld 110 (H) 70 - 99 mg/dL  BUN 92 (H) 6 - 23 mg/dL   Creatinine, Ser 7.14 (H) 0.50 - 1.35 mg/dL   Calcium 8.3 (L) 8.4 - 10.5 mg/dL   Phosphorus 6.4 (H) 2.3 - 4.6 mg/dL   Albumin 1.7 (L) 3.5 - 5.2 g/dL   GFR calc non Af Amer 7 (L) >90 mL/min   GFR calc Af Amer 8 (L) >90 mL/min    Comment: (NOTE) The eGFR has been calculated using the CKD EPI equation. This calculation has not been validated in all clinical situations. eGFR's persistently <90 mL/min signify possible Chronic Kidney Disease.    Anion gap 12 5 - 15  Glucose, capillary     Status: None   Collection Time: 06/05/14  4:06 PM  Result Value Ref Range   Glucose-Capillary 99 70 - 99 mg/dL   Comment 1 Capillary Sample   Glucose, capillary     Status: Abnormal   Collection Time: 06/05/14  7:26 PM  Result Value Ref Range   Glucose-Capillary 113 (H) 70 - 99 mg/dL   Comment 1 Capillary Sample    Comment 2 Documented in Chart    Comment 3 Notify RN   Glucose, capillary     Status: Abnormal   Collection Time: 06/05/14 11:32 PM  Result Value Ref Range   Glucose-Capillary 100 (H) 70 - 99 mg/dL   Comment 1 Capillary Sample    Comment 2 Documented in Chart    Comment 3 Notify RN   Glucose, capillary     Status: None   Collection Time: 06/06/14  3:53 AM  Result Value Ref Range   Glucose-Capillary 88 70 - 99 mg/dL   Comment 1 Capillary Sample    Comment 2 Documented in Chart    Comment 3 Notify RN   Basic metabolic panel     Status: Abnormal   Collection Time:  06/06/14  4:10 AM  Result Value Ref Range   Sodium 144 135 - 145 mmol/L    Comment: Please note change in reference range.   Potassium 4.0 3.5 - 5.1 mmol/L    Comment: Please note change in reference range.   Chloride 110 96 - 112 mEq/L   CO2 26 19 - 32 mmol/L   Glucose, Bld 105 (H) 70 - 99 mg/dL   BUN 63 (H) 6 - 23 mg/dL   Creatinine, Ser 4.59 (H) 0.50 - 1.35 mg/dL   Calcium 8.2 (L) 8.4 - 10.5 mg/dL   GFR calc non Af Amer 12 (L) >90 mL/min   GFR calc Af Amer 14 (L) >90 mL/min    Comment: (NOTE) The eGFR has been calculated using the CKD EPI equation. This calculation has not been validated in all clinical situations. eGFR's persistently <90 mL/min signify possible Chronic Kidney Disease.    Anion gap 8 5 - 15  Magnesium     Status: None   Collection Time: 06/06/14  4:10 AM  Result Value Ref Range   Magnesium 2.0 1.5 - 2.5 mg/dL  Phosphorus     Status: None   Collection Time: 06/06/14  4:10 AM  Result Value Ref Range   Phosphorus 4.5 2.3 - 4.6 mg/dL  CBC     Status: Abnormal   Collection Time: 06/06/14  4:10 AM  Result Value Ref Range   WBC 8.1 4.0 - 10.5 K/uL   RBC 3.42 (L) 4.22 - 5.81 MIL/uL   Hemoglobin 10.4 (L) 13.0 - 17.0 g/dL   HCT 31.2 (L) 39.0 - 52.0 %   MCV 91.2 78.0 -  100.0 fL   MCH 30.4 26.0 - 34.0 pg   MCHC 33.3 30.0 - 36.0 g/dL   RDW 16.0 (H) 11.5 - 15.5 %   Platelets 197 150 - 400 K/uL  Renal function panel (daily at 0500)     Status: Abnormal   Collection Time: 06/06/14  4:10 AM  Result Value Ref Range   Sodium 144 135 - 145 mmol/L    Comment: Please note change in reference range.   Potassium 4.0 3.5 - 5.1 mmol/L    Comment: Please note change in reference range.   Chloride 110 96 - 112 mEq/L   CO2 27 19 - 32 mmol/L   Glucose, Bld 107 (H) 70 - 99 mg/dL   BUN 63 (H) 6 - 23 mg/dL   Creatinine, Ser 4.58 (H) 0.50 - 1.35 mg/dL    Comment: DELTA CHECK NOTED   Calcium 8.2 (L) 8.4 - 10.5 mg/dL   Phosphorus 4.4 2.3 - 4.6 mg/dL   Albumin 1.8 (L) 3.5 - 5.2  g/dL   GFR calc non Af Amer 12 (L) >90 mL/min   GFR calc Af Amer 14 (L) >90 mL/min    Comment: (NOTE) The eGFR has been calculated using the CKD EPI equation. This calculation has not been validated in all clinical situations. eGFR's persistently <90 mL/min signify possible Chronic Kidney Disease.    Anion gap 7 5 - 15  Glucose, capillary     Status: None   Collection Time: 06/06/14  8:22 AM  Result Value Ref Range   Glucose-Capillary 96 70 - 99 mg/dL   Comment 1 Notify RN   Glucose, capillary     Status: Abnormal   Collection Time: 06/06/14 11:50 AM  Result Value Ref Range   Glucose-Capillary 119 (H) 70 - 99 mg/dL   Comment 1 Notify RN   Renal function panel (daily at 1600)     Status: Abnormal   Collection Time: 06/06/14  3:33 PM  Result Value Ref Range   Sodium 142 135 - 145 mmol/L    Comment: Please note change in reference range.   Potassium 4.1 3.5 - 5.1 mmol/L    Comment: Please note change in reference range.   Chloride 106 96 - 112 mEq/L   CO2 29 19 - 32 mmol/L   Glucose, Bld 123 (H) 70 - 99 mg/dL   BUN 48 (H) 6 - 23 mg/dL   Creatinine, Ser 3.54 (H) 0.50 - 1.35 mg/dL   Calcium 8.2 (L) 8.4 - 10.5 mg/dL   Phosphorus 4.9 (H) 2.3 - 4.6 mg/dL   Albumin 1.9 (L) 3.5 - 5.2 g/dL   GFR calc non Af Amer 16 (L) >90 mL/min   GFR calc Af Amer 19 (L) >90 mL/min    Comment: (NOTE) The eGFR has been calculated using the CKD EPI equation. This calculation has not been validated in all clinical situations. eGFR's persistently <90 mL/min signify possible Chronic Kidney Disease.    Anion gap 7 5 - 15  Glucose, capillary     Status: Abnormal   Collection Time: 06/06/14  3:33 PM  Result Value Ref Range   Glucose-Capillary 107 (H) 70 - 99 mg/dL   Comment 1 Notify RN   I-STAT 3, arterial blood gas (G3+)     Status: Abnormal   Collection Time: 06/06/14  3:44 PM  Result Value Ref Range   pH, Arterial 7.240 (L) 7.350 - 7.450   pCO2 arterial 59.7 (HH) 35.0 - 45.0 mmHg   pO2,  Arterial  74.0 (L) 80.0 - 100.0 mmHg   Bicarbonate 25.6 (H) 20.0 - 24.0 mEq/L   TCO2 27 0 - 100 mmol/L   O2 Saturation 91.0 %   Acid-base deficit 3.0 (H) 0.0 - 2.0 mmol/L   Collection site ARTERIAL LINE    Sample type ARTERIAL   Glucose, capillary     Status: None   Collection Time: 06/06/14  7:39 PM  Result Value Ref Range   Glucose-Capillary 95 70 - 99 mg/dL   Comment 1 Capillary Sample    Comment 2 Documented in Chart    Comment 3 Notify RN   Glucose, capillary     Status: None   Collection Time: 06/06/14 11:46 PM  Result Value Ref Range   Glucose-Capillary 91 70 - 99 mg/dL   Comment 1 Capillary Sample    Comment 2 Documented in Chart    Comment 3 Notify RN   Glucose, capillary     Status: None   Collection Time: 06/07/14  3:53 AM  Result Value Ref Range   Glucose-Capillary 85 70 - 99 mg/dL   Comment 1 Capillary Sample    Comment 2 Documented in Chart    Comment 3 Notify RN   Renal function panel (daily at 0500)     Status: Abnormal   Collection Time: 06/07/14  4:10 AM  Result Value Ref Range   Sodium 140 135 - 145 mmol/L    Comment: Please note change in reference range.   Potassium 4.0 3.5 - 5.1 mmol/L    Comment: Please note change in reference range.   Chloride 106 96 - 112 mEq/L   CO2 28 19 - 32 mmol/L   Glucose, Bld 94 70 - 99 mg/dL   BUN 42 (H) 6 - 23 mg/dL   Creatinine, Ser 2.92 (H) 0.50 - 1.35 mg/dL   Calcium 8.0 (L) 8.4 - 10.5 mg/dL   Phosphorus 3.6 2.3 - 4.6 mg/dL   Albumin 1.8 (L) 3.5 - 5.2 g/dL   GFR calc non Af Amer 21 (L) >90 mL/min   GFR calc Af Amer 24 (L) >90 mL/min    Comment: (NOTE) The eGFR has been calculated using the CKD EPI equation. This calculation has not been validated in all clinical situations. eGFR's persistently <90 mL/min signify possible Chronic Kidney Disease.    Anion gap 6 5 - 15  CBC     Status: Abnormal   Collection Time: 06/07/14  4:10 AM  Result Value Ref Range   WBC 7.4 4.0 - 10.5 K/uL   RBC 3.43 (L) 4.22 - 5.81  MIL/uL   Hemoglobin 10.2 (L) 13.0 - 17.0 g/dL   HCT 30.9 (L) 39.0 - 52.0 %   MCV 90.1 78.0 - 100.0 fL   MCH 29.7 26.0 - 34.0 pg   MCHC 33.0 30.0 - 36.0 g/dL   RDW 15.8 (H) 11.5 - 15.5 %   Platelets 222 150 - 400 K/uL  Glucose, capillary     Status: None   Collection Time: 06/07/14  8:29 AM  Result Value Ref Range   Glucose-Capillary 87 70 - 99 mg/dL    Dg Chest Port 1 View  06/06/2014   CLINICAL DATA:  69 year old male with respiratory failure, recent operative aortobifemoral bypass graft, left renal artery bypass. Initial encounter.  EXAM: PORTABLE CHEST - 1 VIEW  COMPARISON:  06/05/2014 and earlier.  FINDINGS: Portable AP semi upright view at 0628 hrs. Stable endotracheal tube. Stable bilateral are IJ vascular catheters. Enteric tube courses to the abdomen,  tip not included.  Stable lung volumes. Stable cardiac size and mediastinal contours. Sequelae of CABG. No pneumothorax. Continued confluent retrocardiac opacity with no large pleural effusion. Superimposed perihilar basilar predominant streaky opacity, no overt edema.  IMPRESSION: 1.  Stable lines and tubes. 2. Stable ventilation with streaky perihilar opacity superimposed on lower lobe collapse or consolidation.   Electronically Signed   By: Lars Pinks M.D.   On: 06/06/2014 07:46   Dg Chest Port 1 View  06/05/2014   CLINICAL DATA:  Post left IJ dialysis catheter insertion.  EXAM: PORTABLE CHEST - 1 VIEW  COMPARISON:  06/05/2014  FINDINGS: Left dialysis catheter placement with the tip in the SVC. Endotracheal tube, right central line, and NG tube are unchanged. There is cardiomegaly with vascular congestion. Bilateral lower lobe airspace opacities are again noted, stable. Possible small left effusion, stable. No right effusion.  IMPRESSION: Left dialysis catheter placement with the tip in the SVC. No pneumothorax. Otherwise no real change.   Electronically Signed   By: Rolm Baptise M.D.   On: 06/05/2014 15:06    Assessment/Plan 1  postoperative atrial fibrillation-the patient has developed atrial fibrillation following his recent vascular surgery. His rate is controlled and his blood pressure is stable. I would recommend continuing carvedilol. His LV function is normal. Once he improves and is extubated would begin anticoagulation when okay with vascular surgery. Given his renal failure he will require Coumadin. If atrial fibrillation persists could then proceed with cardioversion once his INR has been greater than 2 for 3 consecutive weeks. CHADSvasc 4. However I do not think he will require long-term anticoagulation given this is postoperative atrial fibrillation. Check TSH. 2 acute renal failure-the patient has developed acute renal failure felt possibly secondary to hypotension postoperatively. He is on CVVHD. Nephrology is following. He is volume overloaded on examination. 3 VDRF-management per pulmonary. 4 status post peripheral vascular surgery-management per vascular surgery. 5 hypertension-Continue present medications. 6 hyperlipidemia-resume statin at discharge. 7 history of coronary artery disease-would resume statin at discharge. However would not add aspirin as patient will require short-term Coumadin. We will follow. Kirk Ruths MD 06/07/2014, 11:12 AM

## 2014-06-07 NOTE — Progress Notes (Addendum)
PULMONARY / CRITICAL CARE MEDICINE   Name: Barry Lawrence MRN: [id number] DOB: 12-10-1945    ADMISSION DATE:  05/30/2014 CONSULTATION DATE:  05/30/2014  REFERRING MD :  [name]   CHIEF COMPLAINT:  Vent management   INITIAL PRESENTATION:  69 yo male smoker for repair of AAA.  Remained on vent post-op.   PMHx of CAD s/p CABG, CKD III, OSA on CPAP, DM, HTN, mild COPD.  STUDIES:  None  SIGNIFICANT EVENTS: 1/07 OR>> elective repair AAA 1/10 transfuse 2 units PRBC, renal consulted  SUBJECTIVE:  In A fib Remains on CVVHD, ~4L volume removal over last 2 days Awake and tolerating PSV  VITAL SIGNS: Temp:  [97.7 F (36.5 C)-99.6 F (37.6 C)] 99.6 F (37.6 C) (01/15 0800) Pulse Rate:  [65-104] 84 (01/15 0845) Resp:  [15-26] 18 (01/15 0845) BP: (97-169)/(53-100) 123/65 mmHg (01/15 0811) SpO2:  [94 %-100 %] 98 % (01/15 0845) Arterial Line BP: (122-208)/(61-86) 175/77 mmHg (01/15 0800) FiO2 (%):  [40 %] 40 % (01/15 0811) Weight:  [104.6 kg (230 lb 9.6 oz)] 104.6 kg (230 lb 9.6 oz) (01/15 0304) HEMODYNAMICS: CVP:  [8 mmHg-17 mmHg] 15 mmHg VENTILATOR SETTINGS: Vent Mode:  [-] PRVC FiO2 (%):  [40 %] 40 % Set Rate:  [22 bmp] 22 bmp Vt Set:  [530 mL] 530 mL PEEP:  [5 cmH20] 5 cmH20 Pressure Support:  [10 cmH20] 10 cmH20 Plateau Pressure:  [19 cmH20-27 cmH20] 19 cmH20 INTAKE / OUTPUT:  Intake/Output Summary (Last 24 hours) at 06/07/14 0921 Last data filed at 06/07/14 0900  Gross per 24 hour  Intake 1172.75 ml  Output   3568 ml  Net -2395.25 ml    PHYSICAL EXAMINATION: General: no distress Neuro: RASS -1, becomes agitated to stimulation, tries to speak, moves all extremities HEENT: ETT in place Cardiovascular: regular Lungs: focal R exp wheeze, L clear Abdomen: soft, wound site clean Musculoskeletal: 1+ edema Skin: no rashes  LABS:  CBC  Recent Labs Lab 06/05/14 0729 06/06/14 0410 06/07/14 0410  WBC 9.1 8.1 7.4  HGB 10.2* 10.4* 10.2*  HCT 30.0* 31.2* 30.9*  PLT  184 197 222     Coag's  Recent Labs Lab 06/04/14 0459  INR 1.21   BMET  Recent Labs Lab 06/06/14 0410 06/06/14 1533 06/07/14 0410  NA 144  144 142 140  K 4.0  4.0 4.1 4.0  CL 110  110 106 106  CO2 26  27 29 28   BUN 63*  63* 48* 42*  CREATININE 4.59*  4.58* 3.54* 2.92*  GLUCOSE 105*  107* 123* 94   Electrolytes  Recent Labs Lab 06/01/14 0318  06/06/14 0410 06/06/14 1533 06/07/14 0410  CALCIUM 6.5*  < > 8.2*  8.2* 8.2* 8.0*  MG 1.9  --  2.0  --   --   PHOS 5.2*  < > 4.5  4.4 4.9* 3.6  < > = values in this interval not displayed.   Sepsis Markers No results for input(s): LATICACIDVEN, PROCALCITON, O2SATVEN in the last 168 hours. ABG  Recent Labs Lab 06/02/14 0338 06/03/14 0350 06/06/14 1544  PHART 7.356 7.423 7.240*  PCO2ART 42.9 35.5 59.7*  PO2ART 83.0 80.0 74.0*   Liver Enzymes  Recent Labs Lab 06/01/14 0318  06/06/14 0410 06/06/14 1533 06/07/14 0410  AST 211*  --   --   --   --   ALT 71*  --   --   --   --   ALKPHOS 29*  --   --   --   --  BILITOT 0.7  --   --   --   --   ALBUMIN 2.3*  < > 1.8* 1.9* 1.8*  < > = values in this interval not displayed.  Glucose  Recent Labs Lab 06/06/14 0822 06/06/14 1150 06/06/14 1533 06/06/14 1939 06/06/14 2346 06/07/14 0353  GLUCAP 96 119* 107* 95 91 85    Imaging Dg Chest Port 1 View  06/06/2014   CLINICAL DATA:  69 year old male with respiratory failure, recent operative aortobifemoral bypass graft, left renal artery bypass. Initial encounter.  EXAM: PORTABLE CHEST - 1 VIEW  COMPARISON:  06/05/2014 and earlier.  FINDINGS: Portable AP semi upright view at 0628 hrs. Stable endotracheal tube. Stable bilateral are IJ vascular catheters. Enteric tube courses to the abdomen, tip not included.  Stable lung volumes. Stable cardiac size and mediastinal contours. Sequelae of CABG. No pneumothorax. Continued confluent retrocardiac opacity with no large pleural effusion. Superimposed perihilar basilar  predominant streaky opacity, no overt edema.  IMPRESSION: 1.  Stable lines and tubes. 2. Stable ventilation with streaky perihilar opacity superimposed on lower lobe collapse or consolidation.   Electronically Signed   By: Lars Pinks M.D.   On: 06/06/2014 07:46     ASSESSMENT / PLAN:  PULMONARY OETT 01/07 >>> A: Acute respiratory failure with hypoxia following surgery for AAA repair. Pulmonary edema - improving 1/12 Hx of OSA, COPD. P:   Continue full vent support, work towards PSV when MS will allow. Volume status is an issue Fio2 to keep Sao2 > 92% F/u CXR, ABG PRN BD's  CARDIOVASCULAR L radial a line 01/07 >>> R Swan 01/07 >>> 1/09 Rt IJ CVL through cordis 1/09 >> A:  Hypovolemic/hemorrhagic shock after AAA repair. Hx of CAD s/p CABG, HTN, HLD, PAD. P:  Pressors off Goal CVP 8 to 12 Post-op care per [facility] Restarted carvedolol and amlodipine 1/13 Will ask cardiology to eval given his new A Fib Hold outpatient atorvastatin, ezetimibe, fenofibrate, hyzaar   RENAL A:   AKI in setting of hypovolemic/hemorrhagic shock >> Urine Na 19, Urine creatinine 160 from 1/09. Non anion gap metabolic acidosis >> improved. CKD III >> baseline creatinine 1.8 to 2.1. P:   Monitor renal fx, urine outpt Appreciate Dr Jason Nest assistance; CVVHD running. UOP has picked up with addition of lasix 80mg  q8h. Could consider d/c CVVHD and follow UOP vs change to IHD  GASTROINTESTINAL A:   Nutrition. Hx of GERD. P:   TF's running Protonix for SUP  HEMATOLOGIC A:  Acute blood loss anemia. Thrombocytopenia >> PLT 207 from 05/22/14. P: F/u CBC Transfuse for Hb < 7 SCD for DVT prevention  INFECTIOUS A:   Surgical prophylaxis >> off Abx 1/08. P:   Monitor off Abx sputum cx 1/10 >> negative  ENDOCRINE A:   No acute issues. P:   Monitor blood sugars  NEUROLOGIC A:   Acute metabolic encephalopathy. P:   RASS goal 0, currently -1 off versed gtt on 1/12 Continue fentanyl gtt, trying  to wean  Inter-disciplinary family meet or Palliative Care meeting due by:  1/24   CC time 35 minutes.  Baltazar Apo, MD, PhD 06/07/2014, 9:21 AM [facility] [phone] or if no answer [phone]

## 2014-06-07 NOTE — Progress Notes (Signed)
CRRT stopped per Dr. Justin Mend. HD catheter heparin locked per order.  Will continue to monitor.

## 2014-06-08 ENCOUNTER — Inpatient Hospital Stay (HOSPITAL_COMMUNITY): Payer: Medicare Other

## 2014-06-08 DIAGNOSIS — I480 Paroxysmal atrial fibrillation: Secondary | ICD-10-CM

## 2014-06-08 LAB — RENAL FUNCTION PANEL
Albumin: 2 g/dL — ABNORMAL LOW (ref 3.5–5.2)
Albumin: 2 g/dL — ABNORMAL LOW (ref 3.5–5.2)
Anion gap: 6 (ref 5–15)
Anion gap: 10 (ref 5–15)
BUN: 56 mg/dL — ABNORMAL HIGH (ref 6–23)
BUN: 69 mg/dL — ABNORMAL HIGH (ref 6–23)
CO2: 29 mmol/L (ref 19–32)
CO2: 29 mmol/L (ref 19–32)
Calcium: 8.4 mg/dL (ref 8.4–10.5)
Calcium: 8.6 mg/dL (ref 8.4–10.5)
Chloride: 106 mEq/L (ref 96–112)
Chloride: 103 mEq/L (ref 96–112)
Creatinine, Ser: 3.31 mg/dL — ABNORMAL HIGH (ref 0.50–1.35)
Creatinine, Ser: 4.15 mg/dL — ABNORMAL HIGH (ref 0.50–1.35)
GFR calc Af Amer: 21 mL/min — ABNORMAL LOW (ref >90)
GFR calc Af Amer: 16 mL/min — ABNORMAL LOW (ref >90)
GFR calc non Af Amer: 18 mL/min — ABNORMAL LOW (ref >90)
GFR calc non Af Amer: 13 mL/min — ABNORMAL LOW (ref >90)
Glucose, Bld: 106 mg/dL — ABNORMAL HIGH (ref 70–99)
Glucose, Bld: 123 mg/dL — ABNORMAL HIGH (ref 70–99)
Phosphorus: 3.9 mg/dL (ref 2.3–4.6)
Phosphorus: 4.6 mg/dL (ref 2.3–4.6)
Potassium: 3.7 mmol/L (ref 3.5–5.1)
Potassium: 3.5 mmol/L (ref 3.5–5.1)
Sodium: 141 mmol/L (ref 135–145)
Sodium: 142 mmol/L (ref 135–145)

## 2014-06-08 LAB — GLUCOSE, CAPILLARY
Glucose-Capillary: 101 mg/dL — ABNORMAL HIGH (ref 70–99)
Glucose-Capillary: 107 mg/dL — ABNORMAL HIGH (ref 70–99)
Glucose-Capillary: 110 mg/dL — ABNORMAL HIGH (ref 70–99)
Glucose-Capillary: 116 mg/dL — ABNORMAL HIGH (ref 70–99)
Glucose-Capillary: 117 mg/dL — ABNORMAL HIGH (ref 70–99)
Glucose-Capillary: 93 mg/dL (ref 70–99)

## 2014-06-08 LAB — POCT I-STAT 3, ART BLOOD GAS (G3+)
Acid-base deficit: 2 mmol/L (ref 0.0–2.0)
Acid-base deficit: 1 mmol/L (ref 0.0–2.0)
Bicarbonate: 23.7 mEq/L (ref 20.0–24.0)
Bicarbonate: 25.1 mEq/L — ABNORMAL HIGH (ref 20.0–24.0)
O2 Saturation: 92 %
O2 Saturation: 91 %
Patient temperature: 97.9
Patient temperature: 98.9
TCO2: 25 mmol/L (ref 0–100)
TCO2: 27 mmol/L (ref 0–100)
pCO2 arterial: 45.4 mmHg — ABNORMAL HIGH (ref 35.0–45.0)
pCO2 arterial: 47.2 mmHg — ABNORMAL HIGH (ref 35.0–45.0)
pH, Arterial: 7.324 — ABNORMAL LOW (ref 7.350–7.450)
pH, Arterial: 7.335 — ABNORMAL LOW (ref 7.350–7.450)
pO2, Arterial: 68 mmHg — ABNORMAL LOW (ref 80.0–100.0)
pO2, Arterial: 67 mmHg — ABNORMAL LOW (ref 80.0–100.0)

## 2014-06-08 MED ORDER — LORAZEPAM 2 MG/ML IJ SOLN: 1.0000 mg | Freq: Four times a day (QID) | INTRAMUSCULAR | Status: DC | PRN

## 2014-06-08 MED ORDER — CHLORHEXIDINE GLUCONATE 0.12 % MT SOLN
15.0000 mL | Freq: Two times a day (BID) | OROMUCOSAL | Status: DC
  Administered 2014-06-08 – 2014-06-18 (×21): 15 mL via OROMUCOSAL
  Filled 2014-06-08 (×21): qty 15

## 2014-06-08 MED ORDER — CETYLPYRIDINIUM CHLORIDE 0.05 % MT LIQD
7.0000 mL | Freq: Two times a day (BID) | OROMUCOSAL | Status: DC
  Administered 2014-06-08 – 2014-06-16 (×17): 7 mL via OROMUCOSAL

## 2014-06-08 MED ORDER — WARFARIN SODIUM 7.5 MG PO TABS
7.5000 mg | ORAL_TABLET | Freq: Once | ORAL | Status: AC
  Administered 2014-06-08: 7.5 mg via ORAL
  Filled 2014-06-08: qty 1

## 2014-06-08 MED ORDER — LORAZEPAM 2 MG/ML IJ SOLN
0.5000 mg | INTRAMUSCULAR | Status: DC | PRN
  Administered 2014-06-08 – 2014-06-19 (×7): 0.5 mg via INTRAVENOUS
  Filled 2014-06-08 (×4): qty 1

## 2014-06-08 MED ORDER — WARFARIN - PHARMACIST DOSING INPATIENT: Freq: Every day | Status: DC

## 2014-06-08 NOTE — Progress Notes (Signed)
PULMONARY / CRITICAL CARE MEDICINE   Name: Barry Lawrence MRN: [id number] DOB: 04-Jul-1945    ADMISSION DATE:  05/30/2014 CONSULTATION DATE:  05/30/2014  REFERRING MD :  [name]   CHIEF COMPLAINT:  Vent management   INITIAL PRESENTATION:  69 yo male smoker for repair of AAA.  Remained on vent post-op.   PMHx of CAD s/p CABG, CKD III, OSA on CPAP, DM, HTN, mild COPD.  STUDIES:  None  SIGNIFICANT EVENTS: 1/07 OR>> elective repair AAA 1/10 transfuse 2 units PRBC, renal consulted  SUBJECTIVE:  Extubated, on [place].  VITAL SIGNS: Temp:  [97.6 F (36.4 C)-98.9 F (37.2 C)] 97.7 F (36.5 C) (01/16 0800) Pulse Rate:  [75-110] 82 (01/16 1000) Resp:  [14-32] 22 (01/16 1000) BP: (111-154)/(42-79) 120/60 mmHg (01/16 1000) SpO2:  [89 %-99 %] 98 % (01/16 1000) Arterial Line BP: (140-207)/(55-85) 168/62 mmHg (01/16 1000) FiO2 (%):  [40 %-55 %] 55 % (01/15 1800) Weight:  [102.1 kg (225 lb 1.4 oz)] 102.1 kg (225 lb 1.4 oz) (01/16 0500)   HEMODYNAMICS: CVP:  [9 mmHg-22 mmHg] 11 mmHg   VENTILATOR SETTINGS: Vent Mode:  [-] PSV;CPAP FiO2 (%):  [40 %-55 %] 55 % PEEP:  [5 cmH20] 5 cmH20 Pressure Support:  [10 cmH20] 10 cmH20   INTAKE / OUTPUT:  Intake/Output Summary (Last 24 hours) at 06/08/14 1129 Last data filed at 06/08/14 1000  Gross per 24 hour  Intake    590 ml  Output   2298 ml  Net  -1708 ml   PHYSICAL EXAMINATION: General: no distress, extubated but marginal appearing from a respiratory standpoint Neuro: RASS -1, becomes agitated to stimulation, tries to speak, moves all extremities HEENT: Extubated, /AT, PERRL, EOM-I and MMM. Cardiovascular: IRIR, Nl S1/S2, -M/R/G. Lungs: Diffuse end exp wheezing. Abdomen: soft, wound site clean. Musculoskeletal: 1+ edema. Skin: No rashes.  LABS:  CBC  Recent Labs Lab 06/05/14 0729 06/06/14 0410 06/07/14 0410  WBC 9.1 8.1 7.4  HGB 10.2* 10.4* 10.2*  HCT 30.0* 31.2* 30.9*  PLT 184 197 222    Coag's  Recent Labs Lab  06/04/14 0459  INR 1.21   BMET  Recent Labs Lab 06/07/14 0410 06/07/14 1600 06/08/14 0410  NA 140 138 141  K 4.0 3.8 3.7  CL 106 103 106  CO2 28 25 29   BUN 42* 42* 56*  CREATININE 2.92* 2.92* 3.31*  GLUCOSE 94 106* 106*   Electrolytes  Recent Labs Lab 06/06/14 0410  06/07/14 0410 06/07/14 1600 06/08/14 0410  CALCIUM 8.2*  8.2*  < > 8.0* 8.2* 8.4  MG 2.0  --   --   --   --   PHOS 4.5  4.4  < > 3.6 3.4 3.9  < > = values in this interval not displayed.   Sepsis Markers No results for input(s): LATICACIDVEN, PROCALCITON, O2SATVEN in the last 168 hours. ABG  Recent Labs Lab 06/06/14 1544 06/07/14 2229 06/08/14 0139  PHART 7.240* 7.389 7.324*  PCO2ART 59.7* 40.6 45.4*  PO2ART 74.0* 67.0* 68.0*   Liver Enzymes  Recent Labs Lab 06/07/14 0410 06/07/14 1600 06/08/14 0410  ALBUMIN 1.8* 1.9* 2.0*   Glucose  Recent Labs Lab 06/07/14 1237 06/07/14 1525 06/07/14 2002 06/07/14 2345 06/08/14 0424 06/08/14 0835  GLUCAP 98 106* 90 101* 93 117*   Imaging No results found.  ASSESSMENT / PLAN:  PULMONARY OETT 01/07 >>> A: Acute respiratory failure with hypoxia following surgery for AAA repair. Pulmonary edema - improving 1/12 Hx of OSA, COPD. Extubated  but airway protection and pre-existing lung disease are a great source of concern. P:   Extubated. Titrate O2 for sats. PRN BD's Diureses.  CARDIOVASCULAR L radial a line 01/07 >>> R Swan 01/07 >>> 1/09 Rt IJ CVL through cordis 1/09 >> A:  Hypovolemic/hemorrhagic shock after AAA repair. Hx of CAD s/p CABG, HTN, HLD, PAD. P:  Pressors off Goal CVP 8-12, currently 9 cm H2O. Post-op care per [facility] Restarted carvedolol and amlodipine 1/13 Cardiology following.  RENAL A:   AKI in setting of hypovolemic/hemorrhagic shock >> Urine Na 19, Urine creatinine 160 from 1/09. Non anion gap metabolic acidosis >> improved. CKD III >> baseline creatinine 1.8 to 2.1. P:   Monitor renal fx, urine  outpt Defer to renal BMET daily. Replace electrolytes as indicated.  GASTROINTESTINAL A:   Nutrition. Hx of GERD. P:   TF's running. Protonix for SUP.  HEMATOLOGIC A:  Acute blood loss anemia. Thrombocytopenia >> PLT 207 from 05/22/14. P: F/u CBC Transfuse for Hb < 7 SCD for DVT prevention  INFECTIOUS A:   Surgical prophylaxis >> off Abx 1/08. P:   Monitor off Abx Sputum cx 1/10 >> negative  ENDOCRINE A:   No acute issues. P:   Monitor blood sugars  NEUROLOGIC A:   Acute metabolic encephalopathy. P:   Fentanyl PRN for pain. PRN ativan 0.5 mg IV q4 hours PRN ordered, hold for sedation.  Inter-disciplinary family meet or Palliative Care meeting due by:  1/24  Hold in the ICU given respiratory status that is marginal at best.  Need to insure airway protection and mobilization of secretion.  The patient is critically ill with multiple organ systems failure and requires high complexity decision making for assessment and support, frequent evaluation and titration of therapies, application of advanced monitoring technologies and extensive interpretation of multiple databases.   Critical Care Time devoted to patient care services described in this note is  35  Minutes. This time reflects time of care of this signee Dr Jennet Maduro. This critical care time does not reflect procedure time, or teaching time or supervisory time of PA/NP/Med student/Med Resident etc but could involve care discussion time.  Rush Farmer, M.D. [facility] Pulmonary/Critical Care Medicine. Pager: [phone]. After hours pager: [phone].

## 2014-06-08 NOTE — Progress Notes (Addendum)
   VASCULAR SURGERY ASSESSMENT & PLAN:  * 9 Days Post-Op s/p: AFBG and Left RAB  *  Pulmonary: continue aggressive pulmonary support. Off vent. CCM following  * Cardiology wants to start Coumadin for A. fib. As he is 9 days post op that is OK from our standpoint  * Renal: Acute renal failure on top of CKD. Renal function improving. Renal following.   * GI/Nutrition: Getting "trickle" tube feeds.   SUBJECTIVE: Difficult to communicate with.   PHYSICAL EXAM: Filed Vitals:   06/08/14 0400 06/08/14 0500 06/08/14 0600 06/08/14 0700  BP: 141/75  122/76 123/60  Pulse: 91  90 91  Temp: 98.2 F (36.8 C)     TempSrc: Oral     Resp: 31  22 22   Height:      Weight:  225 lb 1.4 oz (102.1 kg)    SpO2: 93%  98% 97%   Abdomen distended. Hypoactive bowel sounds Lungs: rhonchi bilat. Feet warm and well perfused.    LABS: Lab Results  Component Value Date   WBC 7.4 06/07/2014   HGB 10.2* 06/07/2014   HCT 30.9* 06/07/2014   MCV 90.1 06/07/2014   PLT 222 06/07/2014   Lab Results  Component Value Date   CREATININE 3.31* 06/08/2014   Lab Results  Component Value Date   INR 1.21 06/04/2014   CBG (last 3)   Recent Labs  06/07/14 2002 06/07/14 2345 06/08/14 0424  GLUCAP 90 101* 93    Active Problems:   AAA (abdominal aortic aneurysm)   Acute respiratory failure with hypoxia   Post-operative state   Respiratory failure   Acute renal failure   Acute respiratory failure   Atrial fibrillation   Gae Gallop Beeper: [id number] 06/08/2014

## 2014-06-08 NOTE — Progress Notes (Signed)
Pt agitated, restless, and complaining of pain. He is moving around in the bed and keeps requesting to have everything taken off him. CCM notified of situation, and Fentanyl ordered. Will continue to monitor.  Vella Raring, RN

## 2014-06-08 NOTE — Evaluation (Signed)
Clinical/Bedside Swallow Evaluation Patient Details  Name: Barry Lawrence MRN: :[id number] Date of Birth: 1945/09/12  Today's Date: 06/08/2014 Time: [id number] SLP Time Calculation (min) (ACUTE ONLY): 14 min  Past Medical History:  Past Medical History  Diagnosis Date  . CAD (coronary artery disease)     myoview 04/21/11-normal, EF49%  . S/P CABG (coronary artery bypass graft) 1996  . Hyperlipemia   . Polycythemia     H/O  . GERD (gastroesophageal reflux disease)   . Tobacco abuse   . CKD (chronic kidney disease), stage III   . Myocardial infarction 1996  . OSA on CPAP     CPAP q night , CPAP is through [place], states doesn't remember when he had the last study  . Pneumonia     hosp. as a child with pneumonia, not since   . Diabetes     BORDERLINE  . Headache     uses [employer], states he is lessening what he is taking for prep. for surgery   . Arthritis     in his back  . Hypertension     stress test- done 04/2014, followed by Dr. Gwenlyn Found  . Carotid stenosis 01/15/08    right endarterectomy-Dr. Amedeo Plenty  . PAD (peripheral artery disease) 06/12/09    a. 11/22/07 PTA & stenting right external iliac artery;bilateral iliac & PTI and stenting 1997;right ICA =>50% reduction,right SFA >50%,left ICA at stent => 50% reduction,left EIA => 50% reduction,left ATA occluded, left SFA mid > 49% reduction;  03/2014 Angio: LRA 90, patent L Iliac stent and distal RCIA stent, large saccular AAA.  [name] Abdominal aortic aneurysm 01/28/10; 02/05/14    4.3x4.6cm;  now measuring 5.5 cm   Past Surgical History:  Past Surgical History  Procedure Laterality Date  . Carotid endarterectomy Right 01/15/08  . Lumbar disc surgery  1998  . Pv angio  11/22/2007    PTA/ stenting of right common iliac artery with a 10x28mm Smart stent and postdilated with a 7x46mmpowerflex balloon; high grade calcified stenosis of the RICA  . Colonoscopy N/A 07/09/2013    Procedure: COLONOSCOPY;  Surgeon: Juanita Craver, MD;  Location: [facility]  ENDOSCOPY;  Service: Endoscopy;  Laterality: N/A;  . Pv angio  04/01/14    AAA, Lt renal artery stenosis, patent iliacs  . Abdominal aortagram N/A 04/01/2014    Procedure: ABDOMINAL Maxcine Ham;  Surgeon: [name], MD;  Location: [facility] CATH LAB;  Service: Cardiovascular;  Laterality: N/A;  . Back surgery  1988    at [facility]  . Coronary artery bypass graft  1996    LIMA to LAD, sequential vein to OM1 and 2 as well as acure marginal branch and diag branch  . Aorta - bilateral femoral artery bypass graft N/A 05/30/2014    Procedure: AORTOBIFEMORAL BYPASS GRAFT;  Surgeon: Serafina Mitchell, MD;  Location: [facility];  Service: Vascular;  Laterality: N/A;  . Aortic/renal bypass Left 05/30/2014    Procedure: LEFT RENAL ARTERY BYPASS;  Surgeon: Serafina Mitchell, MD;  [facility] OR;  Service: Vascular;  Laterality: Left;   HPI:  69 yo male smoker for repair of AAA. Remained on vent post-op. PMHx of CAD s/p CABG, CKD III, OSA on CPAP, DM, HTN, mild COPD.  Intubated from 1/7 to 1/15.  Pt has been agitated since extubation, in restraints.   Assessment / Plan / Recommendation Clinical Impression  Pt demosntrates findings concerning for silent aspiration secondary to sensory deficits following 10 day intubation. Pt has hoarse vocal quality,  weak volitional cough and no cough response following sips of water though swallow is delayed and likely to result in penetration of airway. SLP provided verbal and tactile cues to improve pts awareness and oral manipulation of PO trials. Recommend pt remain NPO until vocal quality and cough have improved with time post extubation. Expect pt will need objective testing. Will allow 2-3 ice chips following oral care by RN only. SLP will follow up for progress.     Aspiration Risk  Severe    Diet Recommendation Ice chips PRN after oral care;NPO   Medication Administration: Via alternative means    Other  Recommendations Recommended Consults: FEES Oral Care Recommendations:  Oral care Q4 per protocol   Follow Up Recommendations  Inpatient Rehab    Frequency and Duration min 2x/week  2 weeks   Pertinent Vitals/Pain NA    SLP Swallow Goals     Swallow Study Prior Functional Status       General HPI: 69 yo male smoker for repair of AAA. Remained on vent post-op. PMHx of CAD s/p CABG, CKD III, OSA on CPAP, DM, HTN, mild COPD.  Intubated from 1/7 to 1/15.  Pt has been agitated since extubation, in restraints. Type of Study: Bedside swallow evaluation Diet Prior to this Study: NPO Temperature Spikes Noted: No Respiratory Status: Room air History of Recent Intubation: Yes Length of Intubations (days): 10 days Date extubated: 06/07/14 Behavior/Cognition: Alert;Confused;Distractible;Requires cueing Oral Cavity - Dentition: Adequate natural dentition Self-Feeding Abilities: Total assist Patient Positioning: Upright in bed Baseline Vocal Quality: Hoarse;Low vocal intensity;Breathy Volitional Cough: Weak Volitional Swallow: Unable to elicit    Oral/Motor/Sensory Function Overall Oral Motor/Sensory Function:  (generalized weakness)   Ice Chips Ice chips: Impaired Presentation: Spoon Oral Phase Impairments: Impaired anterior to posterior transit Pharyngeal Phase Impairments: Suspected delayed Swallow   Thin Liquid Thin Liquid: Impaired Presentation: Cup Oral Phase Impairments: Reduced labial seal;Impaired anterior to posterior transit;Reduced lingual movement/coordination Oral Phase Functional Implications: Left anterior spillage;Right anterior spillage Pharyngeal  Phase Impairments: Suspected delayed Swallow    Nectar Thick Nectar Thick Liquid: Not tested   Honey Thick Honey Thick Liquid: Not tested   Puree Puree: Impaired Presentation: Spoon Oral Phase Impairments: Impaired anterior to posterior transit;Reduced lingual movement/coordination;Reduced labial seal Oral Phase Functional Implications: Prolonged oral transit Pharyngeal Phase  Impairments: Suspected delayed Swallow   Solid   GO    Solid: Not tested      Herbie Baltimore, MA CCC-SLP [id number]  Farris Blash, Katherene Ponto 06/08/2014,2:27 PM

## 2014-06-08 NOTE — Progress Notes (Addendum)
Subjective:  Patient currently extubated, but really difficult to get history from him.  Objective:  Vital Signs in the last 24 hours: BP 147/66 mmHg  Pulse 84  Temp(Src) 97.7 F (36.5 C) (Oral)  Resp 24  Ht 5\' 7"  (1.702 m)  Wt 102.1 kg (225 lb 1.4 oz)  BMI 35.25 kg/m2  SpO2 98%  Physical Exam: Elderly male currently extubated, but really not able to answer questions Lungs:  Wheezing bilaterally, rhonchi  Cardiac:  Irregular rhythm, normal S1 and S2, no S3 Extremities:  1+ edema present  Intake/Output from previous day: 01/15 0701 - 01/16 0700 In: 598 [I.V.:248; NG/GT:350] Out: 2470 [Urine:1855] Weight Filed Weights   06/06/14 0402 06/07/14 0304 06/08/14 0500  Weight: 106.9 kg (235 lb 10.8 oz) 104.6 kg (230 lb 9.6 oz) 102.1 kg (225 lb 1.4 oz)    Lab Results: Basic Metabolic Panel:  Recent Labs  06/07/14 1600 06/08/14 0410  NA 138 141  K 3.8 3.7  CL 103 106  CO2 25 29  GLUCOSE 106* 106*  BUN 42* 56*  CREATININE 2.92* 3.31*    CBC:  Recent Labs  06/06/14 0410 06/07/14 0410  WBC 8.1 7.4  HGB 10.4* 10.2*  HCT 31.2* 30.9*  MCV 91.2 90.1  PLT 197 222   PROTIME: Lab Results  Component Value Date   INR 1.21 06/04/2014   INR 1.46 05/31/2014   INR 1.42 05/30/2014    Telemetry: Atrial fibrillation with controlled response  Assessment/Plan:  1.  Postoperative atrial fibrillation with controlled response with stable blood pressure. 2.  Acute renal failure. 3.  Previous acute respiratory failure, clinically better and currently extubated. 4.  History of coronary artery disease.  Recommendations:  Since surgery has said to  initiate warfarin we'll go ahead and do this.  Continue other medications.     Kerry Hough  MD [facility] Cardiology  06/08/2014, 10:55 AM

## 2014-06-08 NOTE — Progress Notes (Signed)
[facility] ROUNDING NOTE   Subjective:   Interval History: extubated but appears quite agitated still    Objective:  Vital signs in last 24 hours:  Temp:  [97.7 F (36.5 C)-98.9 F (37.2 C)] 98.9 F (37.2 C) (01/16 1200) Pulse Rate:  [82-110] 82 (01/16 1000) Resp:  [14-32] 22 (01/16 1000) BP: (111-154)/(42-79) 120/60 mmHg (01/16 1000) SpO2:  [89 %-99 %] 98 % (01/16 1000) Arterial Line BP: (140-207)/(55-85) 168/62 mmHg (01/16 1000) FiO2 (%):  [40 %-55 %] 55 % (01/15 1800) Weight:  [102.1 kg (225 lb 1.4 oz)] 102.1 kg (225 lb 1.4 oz) (01/16 0500)  Weight change: -2.5 kg (-5 lb 8.2 oz) Filed Weights   06/06/14 0402 06/07/14 0304 06/08/14 0500  Weight: 106.9 kg (235 lb 10.8 oz) 104.6 kg (230 lb 9.6 oz) 102.1 kg (225 lb 1.4 oz)    Intake/Output: I/O last 3 completed shifts: In: 1299 [I.V.:739; NG/GT:560] Out: 3677 [Urine:2530; [id number]   Intake/Output this shift:  Total I/O In: 150 [I.V.:30; NG/GT:120] Out: 750 [Urine:750]   Urine output increasing  CVS- RRR RS- CTA ABD- BS present soft non-distended EXT- 2+ edema  Basic Metabolic Panel:  Recent Labs Lab 06/06/14 0410 06/06/14 1533 06/07/14 0410 06/07/14 1600 06/08/14 0410  NA 144  144 142 140 138 141  K 4.0  4.0 4.1 4.0 3.8 3.7  CL 110  110 106 106 103 106  CO2 26  27 29 28 25 29   GLUCOSE 105*  107* 123* 94 106* 106*  BUN 63*  63* 48* 42* 42* 56*  CREATININE 4.59*  4.58* 3.54* 2.92* 2.92* 3.31*  CALCIUM 8.2*  8.2* 8.2* 8.0* 8.2* 8.4  MG 2.0  --   --   --   --   PHOS 4.5  4.4 4.9* 3.6 3.4 3.9    Liver Function Tests:  Recent Labs Lab 06/06/14 0410 06/06/14 1533 06/07/14 0410 06/07/14 1600 06/08/14 0410  ALBUMIN 1.8* 1.9* 1.8* 1.9* 2.0*   No results for input(s): LIPASE, AMYLASE in the last 168 hours. No results for input(s): AMMONIA in the last 168 hours.  CBC:  Recent Labs Lab 06/03/14 0345 06/04/14 0459 06/05/14 0729 06/06/14 0410 06/07/14 0410  WBC 8.3 9.5  9.1 8.1 7.4  HGB 7.8* 9.8* 10.2* 10.4* 10.2*  HCT 22.1* 29.2* 30.0* 31.2* 30.9*  MCV 89.5 87.4 89.8 91.2 90.1  PLT 97* 133* 184 197 222    Cardiac Enzymes: No results for input(s): CKTOTAL, CKMB, CKMBINDEX, TROPONINI in the last 168 hours.  BNP: Invalid input(s): POCBNP  CBG:  Recent Labs Lab 06/07/14 2002 06/07/14 2345 06/08/14 0424 06/08/14 0835 06/08/14 1152  GLUCAP 90 101* 93 117* 110*    Microbiology: Results for orders placed or performed during the hospital encounter of 05/30/14  Culture, respiratory (NON-Expectorated)     Status: None   Collection Time: 06/02/14 12:43 PM  Result Value Ref Range Status   Specimen Description ENDOTRACHEAL  Final   Special Requests NONE  Final   Gram Stain   Final    RARE WBC PRESENT,BOTH PMN AND MONONUCLEAR NO SQUAMOUS EPITHELIAL CELLS SEEN NO ORGANISMS SEEN Performed at [facility]    Culture   Final    NO GROWTH 2 DAYS Performed at [facility]    Report Status 06/04/2014 FINAL  Final    Coagulation Studies: No results for input(s): LABPROT, INR in the last 72 hours.  Urinalysis: No results for input(s): COLORURINE, LABSPEC, PHURINE, GLUCOSEU, HGBUR, BILIRUBINUR, KETONESUR, PROTEINUR, UROBILINOGEN, NITRITE,  LEUKOCYTESUR in the last 72 hours.  Invalid input(s): APPERANCEUR    Imaging: Dg Chest Port 1 View  06/08/2014   CLINICAL DATA:  Respiratory distress.  Shortness of breath.  Cough.  EXAM: PORTABLE CHEST - 1 VIEW  COMPARISON:  Chest radiograph 06/06/2014 at 0628 hr  FINDINGS: The endotracheal tube is no longer seen. The enteric tube remains in place, tip below the diaphragm not included in the field of view. Left and right central lines remain in the mid SVC. Patient is post median sternotomy. There is stable cardiomegaly. Decreased atelectasis and hazy opacity at the right lung base. Persistent left basilar retrocardiac opacity. Pulmonary vasculature is stable, no overt edema.  IMPRESSION: 1.  Increasing right basilar opacity with decreasing atelectasis. 2. Stable cardiomegaly and left retrocardiac opacity, atelectasis versus pneumonia.   Electronically Signed   By: Jeb Levering M.D.   On: 06/08/2014 02:55     Medications:   . sodium chloride Stopped (06/07/14 0800)   . sodium chloride   Intravenous Once  . amLODipine  10 mg Per Tube QAC breakfast  . antiseptic oral rinse  7 mL Mouth Rinse q12n4p  . carvedilol  6.25 mg Per Tube BID  . chlorhexidine  15 mL Mouth Rinse BID  . enoxaparin (LOVENOX) injection  30 mg Subcutaneous Daily  . feeding supplement (VITAL HIGH PROTEIN)  1,000 mL Per Tube Q24H  . furosemide  80 mg Intravenous Q6H  . insulin aspart  0-9 Units Subcutaneous 6 times per day  . pantoprazole (PROTONIX) IV  40 mg Intravenous Q24H  . warfarin  7.5 mg Oral ONCE-1800  . Warfarin - Pharmacist Dosing Inpatient   Does not apply q1800   acetaminophen **OR** acetaminophen, albuterol, fentaNYL, hydrALAZINE, LORazepam, ondansetron, sodium chloride  Assessment/ Plan:  [name] Acute renal failure on chronic kidney disease stage III: Available database, history and timeline of events points to ischemic ATN postoperatively Continue to limit nephrotoxin exposure such as iodinated contrast and nonsteroidals .  will follow  2. Anemia: using ESA 3. 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 VDRF: Ventilator management per CCM  Will hold CRRT as renal function appears to be  improving    LOS: 9 Serenity Fortner W @TODAY @1 :50 PM

## 2014-06-08 NOTE — Progress Notes (Signed)
Despite receiving Fentanyl, pt continues to be agitated and restless. CCM notified, and 1 mg Ativan ordered. Will continue to monitor.  Vella Raring, RN

## 2014-06-08 NOTE — Progress Notes (Signed)
ANTICOAGULATION CONSULT NOTE - Initial Consult  Pharmacy Consult for Warfarin Indication: atrial fibrillation  Allergies  Allergen Reactions  . Niaspan [Niacin Er] Hives    Patient Measurements: Height: 5\' 7"  (170.2 cm) Weight: 225 lb 1.4 oz (102.1 kg) IBW/kg (Calculated) : 66.1  Vital Signs: Temp: 97.7 F (36.5 C) (01/16 0800) Temp Source: Oral (01/16 0800) BP: 120/60 mmHg (01/16 1000) Pulse Rate: 82 (01/16 1000)  Labs:  Recent Labs  06/06/14 0410  06/07/14 0410 06/07/14 1600 06/08/14 0410  HGB 10.4*  --  10.2*  --   --   HCT 31.2*  --  30.9*  --   --   PLT 197  --  222  --   --   CREATININE 4.59*  4.58*  < > 2.92* 2.92* 3.31*  < > = values in this interval not displayed.  Estimated Creatinine Clearance: 24.3 mL/min (by C-G formula based on Cr of 3.31).   Medical History: Past Medical History  Diagnosis Date  . CAD (coronary artery disease)     myoview 04/21/11-normal, EF49%  . S/P CABG (coronary artery bypass graft) 1996  . Hyperlipemia   . Polycythemia     H/O  . GERD (gastroesophageal reflux disease)   . Tobacco abuse   . CKD (chronic kidney disease), stage III   . Myocardial infarction 1996  . OSA on CPAP     CPAP q night , CPAP is through [place], states doesn't remember when he had the last study  . Pneumonia     hosp. as a child with pneumonia, not since   . Diabetes     BORDERLINE  . Headache     uses [employer], states he is lessening what he is taking for prep. for surgery   . Arthritis     in his back  . Hypertension     stress test- done 04/2014, followed by Dr. Gwenlyn Found  . Carotid stenosis 01/15/08    right endarterectomy-Dr. Amedeo Plenty  . PAD (peripheral artery disease) 06/12/09    a. 11/22/07 PTA & stenting right external iliac artery;bilateral iliac & PTI and stenting 1997;right ICA =>50% reduction,right SFA >50%,left ICA at stent => 50% reduction,left EIA => 50% reduction,left ATA occluded, left SFA mid > 49% reduction;  03/2014 Angio: LRA 90,  patent L Iliac stent and distal RCIA stent, large saccular AAA.  [name] Abdominal aortic aneurysm 01/28/10; 02/05/14    4.3x4.6cm;  now measuring 5.5 cm    Medications:  Prescriptions prior to admission  Medication Sig Dispense Refill Last Dose  . amLODipine (NORVASC) 10 MG tablet Take 10 mg by mouth daily before breakfast.    05/30/2014 at Unknown time  . Aspirin-Acetaminophen-Caffeine (GOODY HEADACHE PO) Take 1 packet by mouth every hour as needed (for pain).    05/29/2014 at Unknown time  . atorvastatin (LIPITOR) 80 MG tablet Take 80 mg by mouth at bedtime.    05/29/2014 at Unknown time  . carvedilol (COREG) 6.25 MG tablet Take 6.25 mg by mouth 2 (two) times daily with a meal.   05/30/2014 at  0610  . Cholecalciferol (VITAMIN D3) 5000 UNITS CAPS Take 5,000 Units by mouth daily.   05/29/2014 at Unknown time  . ezetimibe (ZETIA) 10 MG tablet Take 10 mg by mouth at bedtime.    05/29/2014 at Unknown time  . fenofibrate 160 MG tablet Take 160 mg by mouth at bedtime.    05/29/2014 at Unknown time  . losartan-hydrochlorothiazide (HYZAAR) 100-25 MG per tablet Take 1 tablet  by mouth daily before breakfast.    05/29/2014 at Unknown time  . omeprazole (PRILOSEC) 20 MG capsule Take 20 mg by mouth at bedtime.    05/29/2014 at Unknown time    Assessment: 69 yo M admitted 05/30/2014  S/p peripheral bypass surgery.  Pharmacy consulted 1/16 to start warfarin for post op afib  PMH: hyperlipidemia,   Coag/Heme:  Post-op afib - CHADSvasc 4, INR 1/12 = 1.2, CBC low, stable s/p 2u PRBC 1/10. aranesp  ID: no issues, afeb currently, wbc wnl  1/10 trach >> ngf  CV: HTN, HLD, PAD, hx MI, CAD s/p CABG hx, AAA repair 1/7 Hypovol/hem shock postop- resolved.  BP /HR wnl.  Amlodipine, , furo 80 IV q6h  Endo: DM. Glucose< goal. SSI (not requiring)  Neuro:  arthritis hx  GI/Nutr: NPO- TF. Amylase wnl. AST elevated, incr 211, ALT now elevated 71. GERD. ppi  Nephro: AKI on CKD3 in setting of hypovolemic/hemorrhagic shock post-AAA  repair/L renal art bypass d/t ischemic ATN. SCr on admit (baseline 1.8-2.1), peak 7.14  CVVHDF 1/13>>1/15.   CrCl ~25 ml/min, good  UOP Lasix 80mg  IV q6h  Pulm: Acute resp failure w/ hypoxia, extubated 1/15. COPD, OSA on CPAP  Goal of Therapy:  INR 2-3 Monitor platelets by anticoagulation protocol: Yes   Plan:  Warfarin 7.5 mg PO today INR Daily Start warfarin education once transferred out of ICU.  Thank you for allowing pharmacy to be a part of this patients care team.  Rowe Robert Pharm.D., BCPS, AQ-Cardiology Clinical Pharmacist 06/08/2014 12:14 PM Pager: [phone] Phone: [phone]

## 2014-06-08 NOTE — Progress Notes (Signed)
Pt's breathing has become labored. He is tachypneic with a rate in the upper 20s to low 30s. He is still on 6 L O2 with O2 sat 91-95%. His congested cough is more frequent. CCM notified. PCXR ordered. Will continue to monitor.  Vella Raring, RN

## 2014-06-09 ENCOUNTER — Inpatient Hospital Stay (HOSPITAL_COMMUNITY): Payer: Medicare Other

## 2014-06-09 DIAGNOSIS — E8770 Fluid overload, unspecified: Secondary | ICD-10-CM

## 2014-06-09 DIAGNOSIS — J9601 Acute respiratory failure with hypoxia: Secondary | ICD-10-CM

## 2014-06-09 DIAGNOSIS — N179 Acute kidney failure, unspecified: Secondary | ICD-10-CM

## 2014-06-09 LAB — POCT I-STAT 3, ART BLOOD GAS (G3+)
Acid-Base Excess: 1 mmol/L (ref 0.0–2.0)
Bicarbonate: 27.9 mEq/L — ABNORMAL HIGH (ref 20.0–24.0)
Bicarbonate: 27.8 mEq/L — ABNORMAL HIGH (ref 20.0–24.0)
O2 Saturation: 93 %
O2 Saturation: 99 %
Patient temperature: 98.7
Patient temperature: 98.7
TCO2: 30 mmol/L (ref 0–100)
TCO2: 29 mmol/L (ref 0–100)
pCO2 arterial: 60.2 mmHg (ref 35.0–45.0)
pCO2 arterial: 55.1 mmHg — ABNORMAL HIGH (ref 35.0–45.0)
pH, Arterial: 7.275 — ABNORMAL LOW (ref 7.350–7.450)
pH, Arterial: 7.311 — ABNORMAL LOW (ref 7.350–7.450)
pO2, Arterial: 79 mmHg — ABNORMAL LOW (ref 80.0–100.0)
pO2, Arterial: 157 mmHg — ABNORMAL HIGH (ref 80.0–100.0)

## 2014-06-09 LAB — GLUCOSE, CAPILLARY
Glucose-Capillary: 101 mg/dL — ABNORMAL HIGH (ref 70–99)
Glucose-Capillary: 109 mg/dL — ABNORMAL HIGH (ref 70–99)
Glucose-Capillary: 110 mg/dL — ABNORMAL HIGH (ref 70–99)
Glucose-Capillary: 110 mg/dL — ABNORMAL HIGH (ref 70–99)
Glucose-Capillary: 114 mg/dL — ABNORMAL HIGH (ref 70–99)
Glucose-Capillary: 115 mg/dL — ABNORMAL HIGH (ref 70–99)
Glucose-Capillary: 124 mg/dL — ABNORMAL HIGH (ref 70–99)

## 2014-06-09 LAB — RENAL FUNCTION PANEL
Albumin: 2.1 g/dL — ABNORMAL LOW (ref 3.5–5.2)
Albumin: 1.9 g/dL — ABNORMAL LOW (ref 3.5–5.2)
Anion gap: 9 (ref 5–15)
Anion gap: 13 (ref 5–15)
BUN: 79 mg/dL — ABNORMAL HIGH (ref 6–23)
BUN: 90 mg/dL — ABNORMAL HIGH (ref 6–23)
CO2: 29 mmol/L (ref 19–32)
CO2: 26 mmol/L (ref 19–32)
Calcium: 8.7 mg/dL (ref 8.4–10.5)
Calcium: 8.7 mg/dL (ref 8.4–10.5)
Chloride: 105 mEq/L (ref 96–112)
Chloride: 106 mEq/L (ref 96–112)
Creatinine, Ser: 4.55 mg/dL — ABNORMAL HIGH (ref 0.50–1.35)
Creatinine, Ser: 4.59 mg/dL — ABNORMAL HIGH (ref 0.50–1.35)
GFR calc Af Amer: 14 mL/min — ABNORMAL LOW (ref >90)
GFR calc Af Amer: 14 mL/min — ABNORMAL LOW (ref >90)
GFR calc non Af Amer: 12 mL/min — ABNORMAL LOW (ref >90)
GFR calc non Af Amer: 12 mL/min — ABNORMAL LOW (ref >90)
Glucose, Bld: 130 mg/dL — ABNORMAL HIGH (ref 70–99)
Glucose, Bld: 135 mg/dL — ABNORMAL HIGH (ref 70–99)
Phosphorus: 6.4 mg/dL — ABNORMAL HIGH (ref 2.3–4.6)
Phosphorus: 5.6 mg/dL — ABNORMAL HIGH (ref 2.3–4.6)
Potassium: 3.7 mmol/L (ref 3.5–5.1)
Potassium: 3.2 mmol/L — ABNORMAL LOW (ref 3.5–5.1)
Sodium: 143 mmol/L (ref 135–145)
Sodium: 145 mmol/L (ref 135–145)

## 2014-06-09 LAB — PROTIME-INR
INR: 1.34 (ref 0.00–1.49)
Prothrombin Time: 16.8 seconds — ABNORMAL HIGH (ref 11.6–15.2)

## 2014-06-09 MED ORDER — ENOXAPARIN SODIUM 100 MG/ML ~~LOC~~ SOLN
1.0000 mg/kg | SUBCUTANEOUS | Status: DC
  Administered 2014-06-09 – 2014-06-13 (×5): 100 mg via SUBCUTANEOUS
  Filled 2014-06-09 (×5): qty 1

## 2014-06-09 MED ORDER — PROPOFOL 10 MG/ML IV EMUL
5.0000 ug/kg/min | INTRAVENOUS | Status: DC
  Administered 2014-06-09: 10 ug/kg/min via INTRAVENOUS
  Administered 2014-06-10 – 2014-06-11 (×2): 20 ug/kg/min via INTRAVENOUS
  Administered 2014-06-11: 15 ug/kg/min via INTRAVENOUS
  Administered 2014-06-11 – 2014-06-12 (×2): 20 ug/kg/min via INTRAVENOUS
  Administered 2014-06-12: 25 ug/kg/min via INTRAVENOUS
  Administered 2014-06-12: 20 ug/kg/min via INTRAVENOUS
  Administered 2014-06-12: 10 ug/kg/min via INTRAVENOUS
  Administered 2014-06-13 – 2014-06-14 (×2): 20 ug/kg/min via INTRAVENOUS
  Administered 2014-06-14: 40 ug/kg/min via INTRAVENOUS
  Administered 2014-06-14: 30 ug/kg/min via INTRAVENOUS
  Administered 2014-06-15: 35 ug/kg/min via INTRAVENOUS
  Administered 2014-06-15: 30 ug/kg/min via INTRAVENOUS
  Administered 2014-06-15: 40 ug/kg/min via INTRAVENOUS
  Administered 2014-06-15: 40.061 ug/kg/min via INTRAVENOUS
  Administered 2014-06-16: 10 ug/kg/min via INTRAVENOUS
  Administered 2014-06-16: 30 ug/kg/min via INTRAVENOUS
  Administered 2014-06-17: 50 ug/kg/min via INTRAVENOUS
  Administered 2014-06-17 (×2): 30 ug/kg/min via INTRAVENOUS
  Administered 2014-06-17: 40 ug/kg/min via INTRAVENOUS
  Administered 2014-06-18: 30 ug/kg/min via INTRAVENOUS
  Administered 2014-06-18: 35 ug/kg/min via INTRAVENOUS
  Filled 2014-06-09 (×34): qty 100

## 2014-06-09 MED ORDER — MIDAZOLAM HCL 2 MG/2ML IJ SOLN
INTRAMUSCULAR | Status: AC
  Administered 2014-06-09: 2 mg
  Filled 2014-06-09: qty 2

## 2014-06-09 MED ORDER — PROPOFOL 10 MG/ML IV EMUL: 5.0000 ug/kg/min | INTRAVENOUS | Status: DC

## 2014-06-09 MED ORDER — ETOMIDATE 2 MG/ML IV SOLN
40.0000 mg | Freq: Once | INTRAVENOUS | Status: AC
  Administered 2014-06-09: 40 mg via INTRAVENOUS
  Filled 2014-06-09: qty 20

## 2014-06-09 MED ORDER — FENTANYL CITRATE 0.05 MG/ML IJ SOLN
50.0000 ug | Freq: Once | INTRAMUSCULAR | Status: AC
Start: 2014-06-09 — End: 2014-06-09
  Administered 2014-06-09: 50 ug via INTRAVENOUS

## 2014-06-09 MED ORDER — PROPOFOL 10 MG/ML IV EMUL
INTRAVENOUS | Status: AC
  Filled 2014-06-09: qty 100

## 2014-06-09 MED ORDER — SODIUM CHLORIDE 0.9 % IV SOLN
40.0000 ug/h | INTRAVENOUS | Status: DC
  Administered 2014-06-09: 50 ug/h via INTRAVENOUS
  Administered 2014-06-09: 20 ug/h via INTRAVENOUS
  Administered 2014-06-11 – 2014-06-17 (×5): 40 ug/h via INTRAVENOUS
  Filled 2014-06-09 (×7): qty 50

## 2014-06-09 MED ORDER — MIDAZOLAM HCL 2 MG/2ML IJ SOLN: 2.0000 mg | Freq: Once | INTRAMUSCULAR | Status: AC

## 2014-06-09 MED ORDER — DEXMEDETOMIDINE HCL IN NACL 200 MCG/50ML IV SOLN
0.4000 ug/kg/h | INTRAVENOUS | Status: DC
  Administered 2014-06-09: 0.4 ug/kg/h via INTRAVENOUS
  Administered 2014-06-09: 0.6 ug/kg/h via INTRAVENOUS
  Administered 2014-06-09: 0.5 ug/kg/h via INTRAVENOUS
  Filled 2014-06-09 (×4): qty 50

## 2014-06-09 NOTE — Progress Notes (Signed)
SLP Cancellation Note  Patient Details Name: Barry Lawrence MRN: [name] DOB: [birth year]   Cancelled treatment:       Reason Eval/Treat Not Completed: Medical issues which prohibited therapy. Pt intubated. Will sign off, reorder when ready.   Herbie [place], [place] CCC-SLP [phone]  Lynann Beaver 06/09/2014, 1:27 PM

## 2014-06-09 NOTE — Progress Notes (Signed)
ANTICOAGULATION CONSULT NOTE - Follow Up Consult  Pharmacy Consult for Lovenox Indication: atrial fibrillation  Allergies  Allergen Reactions  . Niaspan [Niacin Er] Hives    Patient Measurements: Height: 5\' 7"  (170.2 cm) Weight: 217 lb 6 oz (98.6 kg) IBW/kg (Calculated) : 66.1  Vital Signs: Temp: 98.7 F (37.1 C) (01/17 0400) Temp Source: Axillary (01/17 0400) BP: 102/61 mmHg (01/17 0700) Pulse Rate: 67 (01/17 0700)  Labs:  Recent Labs  06/07/14 0410  06/08/14 0410 06/08/14 1630 06/09/14 0353  HGB 10.2*  --   --   --   --   HCT 30.9*  --   --   --   --   PLT 222  --   --   --   --   LABPROT  --   --   --   --  16.8*  INR  --   --   --   --  1.34  CREATININE 2.92*  < > 3.31* 4.15* 4.55*  < > = values in this interval not displayed.  Estimated Creatinine Clearance: 17.4 mL/min (by C-G formula based on Cr of 4.55).  Assessment: 69yom s/p aortobifemoral bypass graft/left renal artery bypass on 1/7 developed post-op afib. He was started on coumadin yesterday. Today coumadin will be stopped and changed to full dose lovenox as Dr. Scot Dock thinks coumadin is interfering with wound healing. Given renal dysfunction will dose lovenox 1mg /kg q24. Last prophylactic dose 1/16 @ 1027. Weight = 98.6kg.  Goal of Therapy:  Anti-Xa level 0.6-1 units/ml 4hrs after LMWH dose given Monitor platelets by anticoagulation protocol: Yes   Plan:  1) Lovenox 100mg  sq q24 2) CBC q72  Deboraha Sprang 06/09/2014,7:49 AM

## 2014-06-09 NOTE — Progress Notes (Addendum)
Subjective:  Had to be reintubated yesterday when he failed the ventilation extubation.  Currently intubated and sedated.  Objective:  Vital Signs in the last 24 hours: BP 111/69 mmHg  Pulse 59  Temp(Src) 97.8 F (36.6 C) (Axillary)  Resp 18  Ht 5\' 7"  (1.702 m)  Wt 98.6 kg (217 lb 6 oz)  BMI 34.04 kg/m2  SpO2 100%  Physical Exam: Elderly male currently intubated and not able to answer questions  Lungs:  Less wheezing than yesterday Cardiac:  Irregular rhythm, normal S1 and S2, no S3 Extremities:  1+ edema present  Intake/Output from previous day: 01/16 0701 - 01/17 0700 In: 769.8 [I.V.:379.8; NG/GT:390] Out: 2651 [Urine:2650; Stool:1] Weight Filed Weights   06/07/14 0304 06/08/14 0500 06/09/14 0700  Weight: 104.6 kg (230 lb 9.6 oz) 102.1 kg (225 lb 1.4 oz) 98.6 kg (217 lb 6 oz)    Lab Results: Basic Metabolic Panel:  Recent Labs  06/08/14 1630 06/09/14 0353  NA 142 143  K 3.5 3.7  CL 103 105  CO2 29 29  GLUCOSE 123* 130*  BUN 69* 79*  CREATININE 4.15* 4.55*    CBC:  Recent Labs  06/07/14 0410  WBC 7.4  HGB 10.2*  HCT 30.9*  MCV 90.1  PLT 222   PROTIME: Lab Results  Component Value Date   INR 1.34 06/09/2014   INR 1.21 06/04/2014   INR 1.46 05/31/2014    Telemetry: Atrial fibrillation with controlled response  Assessment/Plan:  1.  Postoperative atrial fibrillation with controlled response with stable blood pressure. 2.  Acute renal failure. 3.  Recurrent respiratory failure, recurrent reintubation.   4.  History of coronary artery disease.  Recommendations:  Probably need to hold warfarin until over acute phase.  If okay with critical care could represent per tube if it won't interfere with procedures going forward.      Kerry Hough  MD [facility] Cardiology  06/09/2014, 12:02 PM

## 2014-06-09 NOTE — Progress Notes (Signed)
Barry Lawrence ROUNDING NOTE   Subjective:   Interval History: reintubated with mental status changes and some respiratory distress   Objective:  Vital signs in last 24 hours:  Temp:  [97.9 F (36.6 C)-100.2 F (37.9 C)] 98.7 F (37.1 C) (01/17 0400) Pulse Rate:  [59-87] 59 (01/17 1131) Resp:  [17-29] 18 (01/17 1131) BP: (102-152)/(50-128) 111/69 mmHg (01/17 1131) SpO2:  [91 %-100 %] 100 % (01/17 1131) Arterial Line BP: (107-200)/(54-100) 147/64 mmHg (01/17 1100) FiO2 (%):  [40 %-60 %] 40 % (01/17 1131) Weight:  [98.6 kg (217 lb 6 oz)] 98.6 kg (217 lb 6 oz) (01/17 0700)  Weight change: -3.5 kg (-7 lb 11.5 oz) Filed Weights   06/07/14 0304 06/08/14 0500 06/09/14 0700  Weight: 104.6 kg (230 lb 9.6 oz) 102.1 kg (225 lb 1.4 oz) 98.6 kg (217 lb 6 oz)    Intake/Output: I/O last 3 completed shifts: In: 1039.8 [I.V.:499.8; NG/GT:540] Out: 3586 [Urine:3585; Stool:1]   Intake/Output this shift:  Total I/O In: 161.2 [I.V.:91.2; NG/GT:70] Out: 400 [Urine:400]  CVS- RRR RS- CTA ABD- BS present soft non-distended EXT-  1 +  edema   Basic Metabolic Panel:  Recent Labs Lab 06/06/14 0410  06/07/14 0410 06/07/14 1600 06/08/14 0410 06/08/14 1630 06/09/14 0353  NA 144  144  < > 140 138 141 142 143  K 4.0  4.0  < > 4.0 3.8 3.7 3.5 3.7  CL 110  110  < > 106 103 106 103 105  CO2 26  27  < > 28 25 29 29 29   GLUCOSE 105*  107*  < > 94 106* 106* 123* 130*  BUN 63*  63*  < > 42* 42* 56* 69* 79*  CREATININE 4.59*  4.58*  < > 2.92* 2.92* 3.31* 4.15* 4.55*  CALCIUM 8.2*  8.2*  < > 8.0* 8.2* 8.4 8.6 8.7  MG 2.0  --   --   --   --   --   --   PHOS 4.5  4.4  < > 3.6 3.4 3.9 4.6 6.4*  < > = values in this interval not displayed.  Liver Function Tests:  Recent Labs Lab 06/07/14 0410 06/07/14 1600 06/08/14 0410 06/08/14 1630 06/09/14 0353  ALBUMIN 1.8* 1.9* 2.0* 2.0* 2.1*   No results for input(s): LIPASE, AMYLASE in the last 168 hours. No results for  input(s): AMMONIA in the last 168 hours.  CBC:  Recent Labs Lab 06/03/14 0345 06/04/14 0459 06/05/14 0729 06/06/14 0410 06/07/14 0410  WBC 8.3 9.5 9.1 8.1 7.4  HGB 7.8* 9.8* 10.2* 10.4* 10.2*  HCT 22.1* 29.2* 30.0* 31.2* 30.9*  MCV 89.5 87.4 89.8 91.2 90.1  PLT 97* 133* 184 197 222    Cardiac Enzymes: No results for input(s): CKTOTAL, CKMB, CKMBINDEX, TROPONINI in the last 168 hours.  BNP: Invalid input(s): POCBNP  CBG:  Recent Labs Lab 06/08/14 1606 06/08/14 1958 06/09/14 0048 06/09/14 0407 06/09/14 0811  GLUCAP 116* 107* 109* 110* 115*    Microbiology: Results for orders placed or performed during the hospital encounter of 05/30/14  Culture, respiratory (NON-Expectorated)     Status: None   Collection Time: 06/02/14 12:43 PM  Result Value Ref Range Status   Specimen Description ENDOTRACHEAL  Final   Special Requests NONE  Final   Gram Stain   Final    RARE WBC PRESENT,BOTH PMN AND MONONUCLEAR NO SQUAMOUS EPITHELIAL CELLS SEEN NO ORGANISMS SEEN Performed at [facility]  Final    NO GROWTH 2 DAYS Performed at [facility]    Report Status 06/04/2014 FINAL  Final    Coagulation Studies:  Recent Labs  06/09/14 0353  LABPROT 16.8*  INR 1.34    Urinalysis: No results for input(s): COLORURINE, LABSPEC, PHURINE, GLUCOSEU, HGBUR, BILIRUBINUR, KETONESUR, PROTEINUR, UROBILINOGEN, NITRITE, LEUKOCYTESUR in the last 72 hours.  Invalid input(s): APPERANCEUR    Imaging: Portable Chest Xray  06/09/2014   CLINICAL DATA:  Acute onset of shortness of breath. Respiratory failure. Endotracheal tube placement. Initial encounter.  EXAM: PORTABLE CHEST - 1 VIEW  COMPARISON:  Chest radiograph performed 06/08/2014  FINDINGS: The patient's endotracheal tube is seen ending 4 cm above the carina. A right IJ line is noted ending about the mid SVC. A left IJ line is also noted ending about the mid SVC. An enteric tube is noted extending below  the diaphragm.  Persistent retrocardiac airspace opacity raises concern for pneumonia, though mild interstitial edema might have a similar appearance. Increased interstitial markings are seen. No pleural effusion or pneumothorax is identified.  The cardiomediastinal silhouette is mildly enlarged. The patient is status post median sternotomy, with evidence of prior CABG. No acute osseous abnormalities are identified.  IMPRESSION: 1. Endotracheal tube seen ending 4 cm above the carina. 2. Persistent retrocardiac airspace opacity and increased interstitial markings may reflect pneumonia or possibly mild interstitial edema. This is perhaps mildly more prominent than on the prior study. 3. Mild cardiomegaly.   Electronically Signed   By: Garald Balding M.D.   On: 06/09/2014 05:33   Dg Chest Port 1 View  06/08/2014   CLINICAL DATA:  Respiratory distress.  Shortness of breath.  Cough.  EXAM: PORTABLE CHEST - 1 VIEW  COMPARISON:  Chest radiograph 06/06/2014 at 0628 hr  FINDINGS: The endotracheal tube is no longer seen. The enteric tube remains in place, tip below the diaphragm not included in the field of view. Left and right central lines remain in the mid SVC. Patient is post median sternotomy. There is stable cardiomegaly. Decreased atelectasis and hazy opacity at the right lung base. Persistent left basilar retrocardiac opacity. Pulmonary vasculature is stable, no overt edema.  IMPRESSION: 1. Increasing right basilar opacity with decreasing atelectasis. 2. Stable cardiomegaly and left retrocardiac opacity, atelectasis versus pneumonia.   Electronically Signed   By: Jeb Levering M.D.   On: 06/08/2014 02:55     Medications:   . sodium chloride 10 mL/hr at 06/09/14 0900  . dexmedetomidine 0.6 mcg/kg/hr (06/09/14 1100)  . fentaNYL infusion INTRAVENOUS 20 mcg/hr (06/09/14 1100)   . sodium chloride   Intravenous Once  . amLODipine  10 mg Per Tube QAC breakfast  . antiseptic oral rinse  7 mL Mouth Rinse  q12n4p  . carvedilol  6.25 mg Per Tube BID  . chlorhexidine  15 mL Mouth Rinse BID  . enoxaparin (LOVENOX) injection  1 mg/kg Subcutaneous Q24H  . feeding supplement (VITAL HIGH PROTEIN)  1,000 mL Per Tube Q24H  . furosemide  80 mg Intravenous Q6H  . insulin aspart  0-9 Units Subcutaneous 6 times per day  . pantoprazole (PROTONIX) IV  40 mg Intravenous Q24H   acetaminophen **OR** acetaminophen, albuterol, fentaNYL, hydrALAZINE, LORazepam, ondansetron, sodium chloride  Assessment/ Plan:  1. Acute renal failure on chronic kidney disease stage III: Available database, history and timeline of events points to ischemic ATN postoperatively Continue to limit nephrotoxin exposure such as iodinated contrast and non steroidals.  2. Anemia: using ESA 3. Status  post aortobifemoral bypass grafting and left renal artery bypass: Appears to be having good perfusion to his lower extremities, will monitor urine output/renal function closely 4. VDRF: Ventilator management per CCM  Creatinine stabilized   Urine output tremendous yesterday and weight decreasing no dialysis indication today.  Dr Lorrene Reid will be assuming the A service tomorrow     LOS: 31 Barry Lawrence W @TODAY @11 :46 AM

## 2014-06-09 NOTE — Progress Notes (Signed)
   VASCULAR SURGERY ASSESSMENT & PLAN:  * 10 Days Post-Op s/p: aortobifemoral bypass graft and left renal artery bypass.  *  PULMONARY: The patient required reintubation last night. He has significant secretions. Chest x-ray shows increased interstitial markings which could be consistent with pneumonia or mild interstitial edema. He is afebrile and his white blood cell count is normal. He is currently not on antibiotics. Critical care medicine is following.  * RENAL: His creatinine is up slightly. He is still making good urine. Renal is following. He does have significant edema. We will leave any decision to restart CVVH to renal.  * He has some superficial skin necrosis to both groin wounds and some serous drainage from the left groin. Continue meticulous wound care and change dressings to left groin as needed. I will stop his Coumadin as I think this interferes with wound healing and increasing to full dose Lovenox for his atrial fibrillation.  * GI/NUTRITION: Currently he is tolerating his tube feeds.  * CARDIAC: he is on Coumadin because of A. Fib. Given that Coumadin can interfere with wound healing I have stopped his Coumadin and asked the pharmacy to increase his Lovenox to full dose Lovenox for the time being.  * ID: I think that given the superficial skin necrosis of his groin incisions it would be helpful for him to be on antibiotics. Chest x-ray also suggest possible pneumonia. This reason I will see what critical care medicine's thoughts are on choice of antibiotics.  SUBJECTIVE: Patient is sedated on the vent.  PHYSICAL EXAM: Filed Vitals:   06/09/14 0400 06/09/14 0500 06/09/14 0600 06/09/14 0700  BP: 137/61 109/53 103/50 102/61  Pulse: 79 80 65 67  Temp:      TempSrc:      Resp: 22 21 18 18   Height:      Weight:      SpO2: 98% 100% 100% 100%  T max = 99.9  Intake/Output Summary (Last 24 hours) at 06/09/14 [id number] Last data filed at 06/09/14 0600  Gross per 24 hour    Intake 726.02 ml  Output   2651 ml  Net -1924.98 ml    ABDOMEN: Hypoactive bowel sounds LUNGS : Rhonchi bilaterally Both groin wounds have some superficial skin necrosis. There is serous drainage from the left groin. There is no dehiscence. There is no signs of infection. Both feet are warm and well-perfused.  LABS: Lab Results  Component Value Date   WBC 7.4 06/07/2014   HGB 10.2* 06/07/2014   HCT 30.9* 06/07/2014   MCV 90.1 06/07/2014   PLT 222 06/07/2014   Lab Results  Component Value Date   CREATININE 4.55* 06/09/2014   Lab Results  Component Value Date   INR 1.34 06/09/2014   CBG (last 3)   Recent Labs  06/08/14 1958 06/09/14 0048 06/09/14 0407  GLUCAP 107* 109* 110*    Active Problems:   AAA (abdominal aortic aneurysm)   Acute respiratory failure with hypoxia   Post-operative state   Respiratory failure   Acute renal failure   Acute respiratory failure   Atrial fibrillation    Gae Gallop Beeper: [id number] 06/09/2014

## 2014-06-09 NOTE — Progress Notes (Signed)
[facility] Progress Note Patient Name: Barry Lawrence DOB: [birth year] MRN: [id number]:[id number]   Date of Service  06/09/2014  HPI/Events of Note  Patient with HR in the 40s, on precedex 0.6, but still biting tube and mild agitated  eICU Interventions  Stop precedex start propofol.     Intervention Category Intermediate Interventions: Other:  Zela Sobieski 06/09/2014, 3:44 PM

## 2014-06-09 NOTE — Progress Notes (Signed)
Patient with HR in the 40-50's, on precedex 0.6, BP stable, notified CCM. Changed sedation to Propofol. Heart rate continued to be low with frequent pauses. Notified cardiology about heart rate and frequent pauses, obtained EKG. No new orders at this time. Will continue to monitor.   Maia Petties, RN

## 2014-06-09 NOTE — Progress Notes (Signed)
PULMONARY / CRITICAL CARE MEDICINE   Name: Barry Lawrence MRN: [place]:[id number] DOB: Jul 14, 1945    ADMISSION DATE:  05/30/2014 CONSULTATION DATE:  05/30/2014  REFERRING MD :  [name]   CHIEF COMPLAINT:  Vent management   INITIAL PRESENTATION:  69 yo male smoker for repair of AAA.  Remained on vent post-op.   PMHx of CAD s/p CABG, CKD III, OSA on CPAP, DM, HTN, mild COPD.  STUDIES:  None  SIGNIFICANT EVENTS: 1/07 OR>> elective repair AAA 1/10 transfuse 2 units PRBC, renal consulted 1/17 intubated urgently   SUBJECTIVE:  Intubated and sedated.  VITAL SIGNS: Temp:  [97.9 F (36.6 C)-100.2 F (37.9 C)] 98.7 F (37.1 C) (01/17 0400) Pulse Rate:  [62-87] 62 (01/17 0813) Resp:  [17-29] 18 (01/17 0813) BP: (102-152)/(50-128) 112/68 mmHg (01/17 0813) SpO2:  [91 %-100 %] 100 % (01/17 0813) Arterial Line BP: (107-200)/(54-100) 160/66 mmHg (01/17 0800) FiO2 (%):  [50 %-60 %] 50 % (01/17 0813) Weight:  [217 lb 6 oz (98.6 kg)] 217 lb 6 oz (98.6 kg) (01/17 0700)   HEMODYNAMICS: CVP:  [6 mmHg-19 mmHg] 15 mmHg   VENTILATOR SETTINGS: Vent Mode:  [-] PRVC FiO2 (%):  [50 %-60 %] 50 % Set Rate:  [18 bmp] 18 bmp Vt Set:  [530 mL] 530 mL PEEP:  [5 cmH20] 5 cmH20 Plateau Pressure:  [21 cmH20] 21 cmH20   INTAKE / OUTPUT:  Intake/Output Summary (Last 24 hours) at 06/09/14 0842 Last data filed at 06/09/14 0800  Gross per 24 hour  Intake    791 ml  Output   2551 ml  Net  -1760 ml   PHYSICAL EXAMINATION: General: no distress, intubated and sedated Neuro: RASS 0, HEENT: Intubated, Barry Lawrence, PERRL, EOM-I and MMM. Cardiovascular: IRIR, Nl S1/S2, -M/R/G. Lungs: Diffuse end exp wheezing. Decreased bases Abdomen: soft, wound site clean. Musculoskeletal: 1+ edema. Skin: No rashes.  LABS:  CBC  Recent Labs Lab 06/05/14 0729 06/06/14 0410 06/07/14 0410  WBC 9.1 8.1 7.4  HGB 10.2* 10.4* 10.2*  HCT 30.0* 31.2* 30.9*  PLT 184 197 222    Coag's  Recent Labs Lab 06/04/14 0459  06/09/14 0353  INR 1.21 1.34   BMET  Recent Labs Lab 06/08/14 0410 06/08/14 1630 06/09/14 0353  NA 141 142 143  K 3.7 3.5 3.7  CL 106 103 105  CO2 29 29 29   BUN 56* 69* 79*  CREATININE 3.31* 4.15* 4.55*  GLUCOSE 106* 123* 130*   Electrolytes  Recent Labs Lab 06/06/14 0410  06/08/14 0410 06/08/14 1630 06/09/14 0353  CALCIUM 8.2*  8.2*  < > 8.4 8.6 8.7  MG 2.0  --   --   --   --   PHOS 4.5  4.4  < > 3.9 4.6 6.4*  < > = values in this interval not displayed.   Sepsis Markers No results for input(s): LATICACIDVEN, PROCALCITON, O2SATVEN in the last 168 hours. ABG  Recent Labs Lab 06/08/14 1545 06/09/14 0400 06/09/14 0601  PHART 7.335* 7.275* 7.311*  PCO2ART 47.2* 60.2* 55.1*  PO2ART 67.0* 79.0* 157.0*   Liver Enzymes  Recent Labs Lab 06/08/14 0410 06/08/14 1630 06/09/14 0353  ALBUMIN 2.0* 2.0* 2.1*   Glucose  Recent Labs Lab 06/08/14 1152 06/08/14 1606 06/08/14 1958 06/09/14 0048 06/09/14 0407 06/09/14 0811  GLUCAP 110* 116* 107* 109* 110* 115*   Imaging Dg Chest Port 1 View  06/08/2014   CLINICAL DATA:  Respiratory distress.  Shortness of breath.  Cough.  EXAM: PORTABLE CHEST -  1 VIEW  COMPARISON:  Chest radiograph 06/06/2014 at 0628 hr  FINDINGS: The endotracheal tube is no longer seen. The enteric tube remains in place, tip below the diaphragm not included in the field of view. Left and right central lines remain in the mid SVC. Patient is post median sternotomy. There is stable cardiomegaly. Decreased atelectasis and hazy opacity at the right lung base. Persistent left basilar retrocardiac opacity. Pulmonary vasculature is stable, no overt edema.  IMPRESSION: 1. Increasing right basilar opacity with decreasing atelectasis. 2. Stable cardiomegaly and left retrocardiac opacity, atelectasis versus pneumonia.   Electronically Signed   By: Jeb Levering M.D.   On: 06/08/2014 02:55    ASSESSMENT / PLAN:  PULMONARY OETT 01/07 >>>1/16>>re  intubated 1/17>> A: Acute respiratory failure with hypoxia following surgery for AAA repair. Pulmonary edema - improving 1/12 Hx of OSA, COPD. Extubated but airway protection and pre-existing lung disease are a great source of concern. P:   Extubated. Re intubated 1/17 Titrate O2 for sats. PRN BD's Diureses per renal  CARDIOVASCULAR L radial a line 01/07 >>> R Swan 01/07 >>> 1/09 Rt IJ CVL through cordis 1/09 >> A:  Hypovolemic/hemorrhagic shock after AAA repair. Hx of CAD s/p CABG, HTN, HLD, PAD. P:  Pressors off Goal CVP 8-12, currently 15 cm H2O. Post-op care per VVS Restarted carvedolol and amlodipine 1/13 Cardiology following.  RENAL Lab Results  Component Value Date   CREATININE 4.55* 06/09/2014   CREATININE 4.15* 06/08/2014   CREATININE 3.31* 06/08/2014   CREATININE 2.02* 05/03/2014   CREATININE 2.10* 04/15/2014   CREATININE 1.64* 02/16/2013    A:   AKI in setting of hypovolemic/hemorrhagic shock >> Urine Na 19, Urine creatinine 160 from 1/09. Non anion gap metabolic acidosis >> improved. CKD III >> baseline creatinine 1.8 to 2.1. P:   Monitor renal fx, urine outpt Defer to renal BMET daily. Replace electrolytes as indicated.  GASTROINTESTINAL A:   Nutrition. Hx of GERD. P:   TF's running. Protonix for SUP.  HEMATOLOGIC  Recent Labs  06/07/14 0410  HGB 10.2*    A:  Acute blood loss anemia. Thrombocytopenia >> PLT 207 from 05/22/14. P: F/u CBC Transfuse for Hb < 7 SCD for DVT prevention  INFECTIOUS A:   Surgical prophylaxis >> off Abx 1/08. P:   Monitor off Abx Sputum cx 1/10 >> negative  ENDOCRINE CBG (last 3)   Recent Labs  06/09/14 0048 06/09/14 0407 06/09/14 0811  GLUCAP 109* 110* 115*     A:   No acute issues. P:   Monitor blood sugars  NEUROLOGIC A:   Acute metabolic encephalopathy. P:   Fentanyl for tube tolerance.   Inter-disciplinary family meet or Palliative Care meeting due by:  1/24  Today's  summary: Re intubated during the night.  Richardson Landry Minor ACNP Maryanna Shape PCCM Pager [phone] till 3 pm If no answer page [phone] 06/09/2014, 8:49 AM  Reintubated over night due to inability to protect his airway.  Significant amounts of oral secretions noted during intubation per report.  Maintain back on full vent support for now.  Anticipate will require tracheostomy in the not so distant future.  His pulmonary disease is severely advance and wife has little to no insight into his disease process.  In the meantime, continue to diurese.  Monitor closely for the need for abx.  Anticipate will need dialysis but will defer to renal at this point.  PCCM will continue to follow.  The patient is critically ill with multiple organ  systems failure and requires high complexity decision making for assessment and support, frequent evaluation and titration of therapies, application of advanced monitoring technologies and extensive interpretation of multiple databases.   Critical Care Time devoted to patient care services described in this note is  35  Minutes. This time reflects time of care of this signee Dr Jennet Maduro. This critical care time does not reflect procedure time, or teaching time or supervisory time of PA/NP/Med student/Med Resident etc but could involve care discussion time.  Rush Farmer, M.D. [facility] Pulmonary/Critical Care Medicine. Pager: [phone]. After hours pager: [phone].

## 2014-06-09 NOTE — Progress Notes (Signed)
PT Cancellation Note  Patient Details Name: Barry Lawrence MRN: [id number] DOB: 1945-12-02   Cancelled Treatment:    Reason Eval/Treat Not Completed: Patient not medically ready Spoke with RN this morning. Reports patient was intubated over night. Will follow up when patient is medically ready.  [name] 06/09/2014, 8:55 AM Elayne Snare, [place]

## 2014-06-09 NOTE — Procedures (Signed)
Intubation Procedure Note Barry Lawrence [place]:[id number] 1945-06-07  Procedure: Intubation Indications: Respiratory insufficiency  Procedure Details Consent: Unable to obtain consent because of altered level of consciousness. Time Out: Verified patient identification, verified procedure, site/side was marked, verified correct patient position, special equipment/implants available, medications/allergies/relevent history reviewed, required imaging and test results available.  Performed  4   Evaluation Hemodynamic Status: BP stable throughout; O2 sats: stable throughout Patient's Current Condition: stable Complications: No apparent complications Patient did tolerate procedure well. Chest X-ray ordered to verify placement.  CXR: pending.   Barry Lawrence 06/09/2014

## 2014-06-10 ENCOUNTER — Inpatient Hospital Stay (HOSPITAL_COMMUNITY): Payer: Medicare Other

## 2014-06-10 LAB — RENAL FUNCTION PANEL
Albumin: 1.9 g/dL — ABNORMAL LOW (ref 3.5–5.2)
Albumin: 1.9 g/dL — ABNORMAL LOW (ref 3.5–5.2)
Anion gap: 16 — ABNORMAL HIGH (ref 5–15)
Anion gap: 13 (ref 5–15)
BUN: 98 mg/dL — ABNORMAL HIGH (ref 6–23)
BUN: 103 mg/dL — ABNORMAL HIGH (ref 6–23)
CO2: 26 mmol/L (ref 19–32)
CO2: 26 mmol/L (ref 19–32)
Calcium: 8.6 mg/dL (ref 8.4–10.5)
Calcium: 8.3 mg/dL — ABNORMAL LOW (ref 8.4–10.5)
Chloride: 104 mEq/L (ref 96–112)
Chloride: 104 mEq/L (ref 96–112)
Creatinine, Ser: 4.78 mg/dL — ABNORMAL HIGH (ref 0.50–1.35)
Creatinine, Ser: 4.88 mg/dL — ABNORMAL HIGH (ref 0.50–1.35)
GFR calc Af Amer: 13 mL/min — ABNORMAL LOW (ref >90)
GFR calc Af Amer: 13 mL/min — ABNORMAL LOW (ref >90)
GFR calc non Af Amer: 11 mL/min — ABNORMAL LOW (ref >90)
GFR calc non Af Amer: 11 mL/min — ABNORMAL LOW (ref >90)
Glucose, Bld: 106 mg/dL — ABNORMAL HIGH (ref 70–99)
Glucose, Bld: 121 mg/dL — ABNORMAL HIGH (ref 70–99)
Phosphorus: 5.4 mg/dL — ABNORMAL HIGH (ref 2.3–4.6)
Phosphorus: 5.2 mg/dL — ABNORMAL HIGH (ref 2.3–4.6)
Potassium: 2.9 mmol/L — ABNORMAL LOW (ref 3.5–5.1)
Potassium: 3.3 mmol/L — ABNORMAL LOW (ref 3.5–5.1)
Sodium: 146 mmol/L — ABNORMAL HIGH (ref 135–145)
Sodium: 143 mmol/L (ref 135–145)

## 2014-06-10 LAB — CBC
HCT: 30.4 % — ABNORMAL LOW (ref 39.0–52.0)
Hemoglobin: 10.1 g/dL — ABNORMAL LOW (ref 13.0–17.0)
MCH: 29.8 pg (ref 26.0–34.0)
MCHC: 33.2 g/dL (ref 30.0–36.0)
MCV: 89.7 fL (ref 78.0–100.0)
Platelets: 308 10*3/uL (ref 150–400)
RBC: 3.39 MIL/uL — ABNORMAL LOW (ref 4.22–5.81)
RDW: 15.5 % (ref 11.5–15.5)
WBC: 10.9 10*3/uL — ABNORMAL HIGH (ref 4.0–10.5)

## 2014-06-10 LAB — POCT I-STAT 3, ART BLOOD GAS (G3+)
Acid-Base Excess: 2 mmol/L (ref 0.0–2.0)
Bicarbonate: 27 mEq/L — ABNORMAL HIGH (ref 20.0–24.0)
O2 Saturation: 96 %
Patient temperature: 98.3
TCO2: 28 mmol/L (ref 0–100)
pCO2 arterial: 44.9 mmHg (ref 35.0–45.0)
pH, Arterial: 7.387 (ref 7.350–7.450)
pO2, Arterial: 86 mmHg (ref 80.0–100.0)

## 2014-06-10 LAB — GLUCOSE, CAPILLARY
Glucose-Capillary: 106 mg/dL — ABNORMAL HIGH (ref 70–99)
Glucose-Capillary: 87 mg/dL (ref 70–99)
Glucose-Capillary: 105 mg/dL — ABNORMAL HIGH (ref 70–99)
Glucose-Capillary: 105 mg/dL — ABNORMAL HIGH (ref 70–99)
Glucose-Capillary: 109 mg/dL — ABNORMAL HIGH (ref 70–99)

## 2014-06-10 LAB — LIPID PANEL
Cholesterol: 74 mg/dL (ref 0–200)
HDL: 12 mg/dL — ABNORMAL LOW (ref >39)
LDL Cholesterol: 25 mg/dL (ref 0–99)
Total CHOL/HDL Ratio: 6.2 RATIO
Triglycerides: 185 mg/dL — ABNORMAL HIGH (ref <150)
VLDL: 37 mg/dL (ref 0–40)

## 2014-06-10 LAB — CLOSTRIDIUM DIFFICILE BY PCR: Toxigenic C. Difficile by PCR: NEGATIVE

## 2014-06-10 MED ORDER — POTASSIUM CHLORIDE 10 MEQ/100ML IV SOLN
10.0000 meq | INTRAVENOUS | Status: AC
  Administered 2014-06-10 (×4): 10 meq via INTRAVENOUS
  Filled 2014-06-10 (×3): qty 100

## 2014-06-10 MED ORDER — FREE WATER
200.0000 mL | Freq: Four times a day (QID) | Status: DC
  Administered 2014-06-10 – 2014-06-11 (×4): 200 mL

## 2014-06-10 NOTE — Progress Notes (Signed)
[facility] ROUNDING NOTE   Subjective:  Intubated I Opens eyes Objective:   BP 130/60 mmHg  Pulse 65  Temp(Src) 98.3 F (36.8 C) (Axillary)  Resp 18  Ht 5\' 7"  (1.702 m)  Wt 97.5 kg (214 lb 15.2 oz)  BMI 33.66 kg/m2  SpO2 100%  I/O last 3 completed shifts: In: 1550.9 [I.V.:950.9; NG/GT:600] Out: 3601 [Urine:3600; Stool:1] Total I/O In: 68.2 [I.V.:28.2; NG/GT:40] Out: 125 [Urine:125]  Intubated, resting on vent. OTT/NG/Left IJ trialysis catheter (1/13) VS as noted Lungs ant fairly clear S1S2 No S3 Abd incision red, groin incisions dressed (did not remove - said to have some some skin necrosis when examined yest by VVS) Edema 1+ LE to hips/+scrotal edema Diarrheal stool   Recent Labs Lab 06/06/14 0410  06/08/14 0410 06/08/14 1630 06/09/14 0353 06/09/14 1600 06/10/14 0445  NA 144  144  < > 141 142 143 145 146*  K 4.0  4.0  < > 3.7 3.5 3.7 3.2* 2.9*  CL 110  110  < > 106 103 105 106 104  CO2 26  27  < > 29 29 29 26 26   GLUCOSE 105*  107*  < > 106* 123* 130* 135* 106*  BUN 63*  63*  < > 56* 69* 79* 90* 98*  CREATININE 4.59*  4.58*  < > 3.31* 4.15* 4.55* 4.59* 4.78*  CALCIUM 8.2*  8.2*  < > 8.4 8.6 8.7 8.7 8.6  MG 2.0  --   --   --   --   --   --   PHOS 4.5  4.4  < > 3.9 4.6 6.4* 5.6* 5.4*  < > = values in this interval not displayed.   Recent Labs Lab 06/08/14 0410 06/08/14 1630 06/09/14 0353 06/09/14 1600 06/10/14 0445  ALBUMIN 2.0* 2.0* 2.1* 1.9* 1.9*   CBC:  Recent Labs Lab 06/04/14 0459 06/05/14 0729 06/06/14 0410 06/07/14 0410 06/10/14 0445  WBC 9.5 9.1 8.1 7.4 10.9*  HGB 9.8* 10.2* 10.4* 10.2* 10.1*  HCT 29.2* 30.0* 31.2* 30.9* 30.4*  MCV 87.4 89.8 91.2 90.1 89.7  PLT 133* 184 197 222 308    Recent Labs Lab 06/09/14 1547 06/09/14 1923 06/09/14 2353 06/10/14 0358 06/10/14 0747  GLUCAP 124* 110* 101* 106* 87    Recent Labs  06/09/14 0353  LABPROT 16.8*  INR 1.34   Imaging: Dg Chest Port 1  View  06/10/2014   CLINICAL DATA:  Respiratory failure/hypoxia  EXAM: PORTABLE CHEST - 1 VIEW  COMPARISON:  June 09, 2014  FINDINGS: Endotracheal tube tip is 6.7 cm above the carina. Central catheter tips are in the superior vena cava. Nasogastric tube tip and side port is below the diaphragm. No pneumothorax. There is atelectatic change in the left lower lobe. Elsewhere lungs are clear. Heart is mildly enlarged with pulmonary vascularity within normal limits. No adenopathy. Patient is status post coronary artery bypass grafting.  IMPRESSION: Tube and catheter positions as described without pneumothorax. Left lower lobe atelectatic change. No change in cardiac silhouette. No new opacity.   Electronically Signed   By: Lowella Grip M.D.   On: 06/10/2014 07:29   Portable Chest Xray  06/09/2014   CLINICAL DATA:  Acute onset of shortness of breath. Respiratory failure. Endotracheal tube placement. Initial encounter.  EXAM: PORTABLE CHEST - 1 VIEW  COMPARISON:  Chest radiograph performed 06/08/2014  FINDINGS: The patient's endotracheal tube is seen ending 4 cm above the carina. A right IJ line is noted ending about the  mid SVC. A left IJ line is also noted ending about the mid SVC. An enteric tube is noted extending below the diaphragm.  Persistent retrocardiac airspace opacity raises concern for pneumonia, though mild interstitial edema might have a similar appearance. Increased interstitial markings are seen. No pleural effusion or pneumothorax is identified.  The cardiomediastinal silhouette is mildly enlarged. The patient is status post median sternotomy, with evidence of prior CABG. No acute osseous abnormalities are identified.  IMPRESSION: 1. Endotracheal tube seen ending 4 cm above the carina. 2. Persistent retrocardiac airspace opacity and increased interstitial markings may reflect pneumonia or possibly mild interstitial edema. This is perhaps mildly more prominent than on the prior study. 3. Mild  cardiomegaly.   Electronically Signed   By: Garald Balding M.D.   On: 06/09/2014 05:33     Medications:  Infusions . sodium chloride 10 mL/hr at 06/10/14 0800  . fentaNYL infusion INTRAVENOUS 40 mcg/hr (06/10/14 0830)  . propofol 15 mcg/kg/min (06/10/14 0830)  Scheduled Medications . sodium chloride   Intravenous Once  . amLODipine  10 mg Per Tube QAC breakfast  . antiseptic oral rinse  7 mL Mouth Rinse q12n4p  . carvedilol  6.25 mg Per Tube BID  . chlorhexidine  15 mL Mouth Rinse BID  . enoxaparin (LOVENOX) injection  1 mg/kg Subcutaneous Q24H  . feeding supplement (VITAL HIGH PROTEIN)  1,000 mL Per Tube Q24H  . furosemide  80 mg Intravenous Q6H  . insulin aspart  0-9 Units Subcutaneous 6 times per day  . pantoprazole (PROTONIX) IV  40 mg Intravenous Q24H   acetaminophen **OR** acetaminophen, albuterol, fentaNYL, hydrALAZINE, LORazepam, ondansetron, sodium chloride  Assessment/ Plan:   69 yo male with background of CAD s/p CABG, history of R CEA/bilat LE stenting, HLD, tobacco use, CKD III (baseline creatinine 1.8-2), OSA on CPAP, DM, HTN, mild COPD who developed ischemic ATN following aortobifemoral bypass graft and left renal artery bypass on 05/30/14 with pressor dependent hypotension/ABLA contributing. Required CRRT 06/05/14-06/07/14. Monitoring for recovery of function. Course otherwise complicated by VDRF (required reintubation 1/17)   Acute renal failure on chronic kidney disease stage III: s/p CRRT 1/13-1/15.  UOP appeared to be picking up past 3 days (with lasix) but creatinine has not reached plateau, pt fairly azotemic but no acute HD indications. Today up to 4.78, K low at 2.9 and sodium rising. Needs K and free water replacement. Give free water 200 every 6 hours via tube and 4 K runs with repeat lab this afternoon  Anemia: Hb stable around 10. No ESA that I can see  Status post aortobifemoral bypass grafting and left renal artery bypass: Wounds with some necrosis. Wound  care. Per VVS  VDRF: Ventilator management per CCM. Reintubated d/t inability to clear secretions, which are still thick  Atrial fib appears rate controlled. Cardiology is following.  H/O CAD  HTN - stable  Jamal Maes, MD [facility] [phone] Pager 06/10/2014, 9:47 AM

## 2014-06-10 NOTE — Progress Notes (Signed)
UR Completed.  [phone]  

## 2014-06-10 NOTE — Progress Notes (Signed)
PULMONARY / CRITICAL CARE MEDICINE   Name: Barry Lawrence MRN: [id number] DOB: [birth year]    ADMISSION DATE:  05/30/2014 CONSULTATION DATE:  05/30/2014  REFERRING MD :  [name]   CHIEF COMPLAINT:  Vent management   INITIAL PRESENTATION:  69 yo male smoker for repair of AAA.  Remained on vent post-op.   PMHx of CAD s/p CABG, CKD III, OSA on CPAP, DM, HTN, mild COPD.  STUDIES:  None  SIGNIFICANT EVENTS: 1/07 OR>> elective repair AAA 1/10 transfuse 2 units PRBC, renal consulted 1/17 intubated urgently 1/18 bradycardia from precedex >> change to diprivan  SUBJECTIVE:  Did not tolerate SBT.  VITAL SIGNS: Temp:  [97.7 F (36.5 C)-98.3 F (36.8 C)] 98.3 F (36.8 C) (01/18 0400) Pulse Rate:  [40-72] 65 (01/18 0800) Resp:  [16-24] 18 (01/18 0800) BP: (86-143)/(48-108) 130/60 mmHg (01/18 0800) SpO2:  [91 %-100 %] 100 % (01/18 0800) Arterial Line BP: (123-178)/(50-67) 172/56 mmHg (01/18 0800) FiO2 (%):  [40 %-50 %] 50 % (01/18 0752) Weight:  [214 lb 15.2 oz (97.5 kg)] 214 lb 15.2 oz (97.5 kg) (01/18 0600)   HEMODYNAMICS: CVP:  [9 mmHg-17 mmHg] 11 mmHg   VENTILATOR SETTINGS: Vent Mode:  [-] PRVC FiO2 (%):  [40 %-50 %] 50 % Set Rate:  [18 bmp] 18 bmp Vt Set:  [530 mL] 530 mL PEEP:  [5 cmH20] 5 cmH20 Plateau Pressure:  [21 cmH20-23 cmH20] 21 cmH20   INTAKE / OUTPUT:  Intake/Output Summary (Last 24 hours) at 06/10/14 0855 Last data filed at 06/10/14 0830  Gross per 24 hour  Intake 1108.09 ml  Output   2525 ml  Net -1416.91 ml   PHYSICAL EXAMINATION: General: no distress Neuro: RASS -1 HEENT: ETT in place Cardiovascular: irregular Lungs: scattered rhonchi Abdomen: soft, non tender Musculoskeletal: 1+ edema Skin: No rashes  LABS:  CBC  Recent Labs Lab 06/06/14 0410 06/07/14 0410 06/10/14 0445  WBC 8.1 7.4 10.9*  HGB 10.4* 10.2* 10.1*  HCT 31.2* 30.9* 30.4*  PLT 197 222 308    Coag's  Recent Labs Lab 06/04/14 0459 06/09/14 0353  INR 1.21 1.34    BMET  Recent Labs Lab 06/09/14 0353 06/09/14 1600 06/10/14 0445  NA 143 145 146*  K 3.7 3.2* 2.9*  CL 105 106 104  CO2 29 26 26   BUN 79* 90* 98*  CREATININE 4.55* 4.59* 4.78*  GLUCOSE 130* 135* 106*   Electrolytes  Recent Labs Lab 06/06/14 0410  06/09/14 0353 06/09/14 1600 06/10/14 0445  CALCIUM 8.2*  8.2*  < > 8.7 8.7 8.6  MG 2.0  --   --   --   --   PHOS 4.5  4.4  < > 6.4* 5.6* 5.4*  < > = values in this interval not displayed.   ABG  Recent Labs Lab 06/09/14 0400 06/09/14 0601 06/10/14 0403  PHART 7.275* 7.311* 7.387  PCO2ART 60.2* 55.1* 44.9  PO2ART 79.0* 157.0* 86.0   Liver Enzymes  Recent Labs Lab 06/09/14 0353 06/09/14 1600 06/10/14 0445  ALBUMIN 2.1* 1.9* 1.9*   Glucose  Recent Labs Lab 06/09/14 1157 06/09/14 1547 06/09/14 1923 06/09/14 2353 06/10/14 0358 06/10/14 0747  GLUCAP 114* 124* 110* 101* 106* 87   Imaging Portable Chest Xray  06/09/2014   CLINICAL DATA:  Acute onset of shortness of breath. Respiratory failure. Endotracheal tube placement. Initial encounter.  EXAM: PORTABLE CHEST - 1 VIEW  COMPARISON:  Chest radiograph performed 06/08/2014  FINDINGS: The patient's endotracheal tube is seen ending 4 cm  above the carina. A right IJ line is noted ending about the mid SVC. A left IJ line is also noted ending about the mid SVC. An enteric tube is noted extending below the diaphragm.  Persistent retrocardiac airspace opacity raises concern for pneumonia, though mild interstitial edema might have a similar appearance. Increased interstitial markings are seen. No pleural effusion or pneumothorax is identified.  The cardiomediastinal silhouette is mildly enlarged. The patient is status post median sternotomy, with evidence of prior CABG. No acute osseous abnormalities are identified.  IMPRESSION: 1. Endotracheal tube seen ending 4 cm above the carina. 2. Persistent retrocardiac airspace opacity and increased interstitial markings may reflect  pneumonia or possibly mild interstitial edema. This is perhaps mildly more prominent than on the prior study. 3. Mild cardiomegaly.   Electronically Signed   By: Garald Balding M.D.   On: 06/09/2014 05:33    ASSESSMENT / PLAN:  PULMONARY OETT 01/07 >>>1/16>>re intubated 1/17>> A: Acute respiratory failure with hypoxia following surgery for AAA repair. Hx of OSA, COPD. Re-intubated 1/17 due to airway protection and WOB. P:   F/u CXR Goal negative fluid balance PRN BD's Will likely need trach  CARDIOVASCULAR L radial a line 01/07 >>> R Swan 01/07 >>> 1/09 Rt IJ CVL through cordis 1/09 >> A:  Hypovolemic/hemorrhagic shock after AAA repair. Hx of CAD s/p CABG, HTN, HLD, PAD. P:  Coreg, lasix, norvasc  RENAL A:   AKI in setting of hypovolemic/hemorrhagic shock >> Urine Na 19, Urine creatinine 160 from 1/09. Non anion gap metabolic acidosis >> improved. CKD III >> baseline creatinine 1.8 to 2.1. Hypokalemia. P:   Monitor renal fx, urine outpt Replace electrolytes as needed  No indication for HD at present  GASTROINTESTINAL A:   Nutrition. Hx of GERD. P:   TF's running Protonix for SUP  HEMATOLOGIC A:  Acute blood loss anemia. Thrombocytopenia >> PLT 207 from 05/22/14. P: F/u CBC Transfuse for Hb < 7 Full dose lovenox  INFECTIOUS A:   Surgical prophylaxis >> off Abx 1/08. P:   Monitor off Abx  ENDOCRINE A:   No acute issues. P:   Monitor blood sugars  NEUROLOGIC A:   Acute metabolic encephalopathy. P:   Diprivan, fentanyl   Summary: Will likely need trach to assist with vent weaning.  CC time 35 minutes.  Chesley Mires, MD [facility] Pulmonary/Critical Care 06/10/2014, 9:01 AM Pager:  [phone] After 3pm call: [phone]

## 2014-06-10 NOTE — Progress Notes (Signed)
[place] Progress Note Patient Name: Barry Lawrence DOB: [birth year] MRN: [id number]   Date of Service  06/10/2014  HPI/Events of Note  Agitation difficult to control with out physiology impairment  eICU Interventions  Bilateral wrist restraints renewed      Intervention Category Major Interventions: Delirium, psychosis, severe agitation - evaluation and management  Asencion Noble 06/10/2014, 7:54 PM

## 2014-06-10 NOTE — Progress Notes (Addendum)
   [facility]  Subjective  - Re intubated 04/09/2015 unable to clear secretions.   BP stable coreg and Norvasc per NG tube.  Objective 108/57 63 98.3 F (36.8 C) (Axillary) 18 100%  Intake/Output Summary (Last 24 hours) at 06/10/14 0714 Last data filed at 06/10/14 0600  Gross per 24 hour  Intake 1066.94 ml  Output   2425 ml  Net -1358.06 ml     Lungs intubated Heart irregular 68 bpm Abdomin soft positive BS, incision healing well Doppler PT bilaterally DP left biphasic Scrotal swelling  Assessment/Planning: POD # 11 aortobifem with left renal artery bypass  Renal Cr 4.78 UO 50-125 cc/hr. Per nephrology: Creatinine stabilized Urine output tremendous yesterday and weight decreasing no dialysis indication today 06/09/2014. Intubated NG tube nutrition Bilateral groins SS drainage dry dressing changes daily Cardiac A-fib on amiodarone DVT SCD's and Lovenox full dose.  Per Dr. Nicole Cella note: he is on Coumadin because of A. Fib. Given that Coumadin can interfere with wound healing I have stopped his Coumadin and asked the pharmacy to increase his Lovenox to full dose Lovenox for the time being.  ID: I think that given the superficial skin necrosis of his groin incisions it would be helpful for him to be on antibiotics. Chest x-ray also suggest possible pneumonia. This reason I will see what critical care medicine's thoughts are on choice of antibiotics. No antibiotics started yet. New chest x-ray pending      Laurence Slate [facility] 06/10/2014 7:14 AM --  Laboratory Lab Results:  Recent Labs  06/10/14 0445  WBC 10.9*  HGB 10.1*  HCT 30.4*  PLT 308   BMET  Recent Labs  06/09/14 1600 06/10/14 0445  NA 145 146*  K 3.2* 2.9*  CL 106 104  CO2 26 26  GLUCOSE 135* 106*  BUN 90* 98*  CREATININE 4.59* 4.78*  CALCIUM 8.7 8.6    COAG Lab Results  Component Value Date   INR 1.34 06/09/2014   INR 1.21 06/04/2014   INR  1.46 05/31/2014   No results found for: PTT    Events of the weekend were noted. -Continue tube feedings -Dialysis per renal area the patient's creatinine remained stable and he is making urine although is volume overloaded -We'll continue to monitor groin incisions as there is the beginnings of some skin breakdown with drainage.  Continue to monitor swelling. -I am okay with IV heparin or Lovenox, but I would not start oral anticoagulation at this time.   Annamarie Major

## 2014-06-10 NOTE — Progress Notes (Signed)
SUBJECTIVE:  Intubated and sedated   PHYSICAL EXAM Filed Vitals:   06/10/14 0500 06/10/14 0600 06/10/14 0700 06/10/14 0752  BP: 96/52 108/57 122/56   Pulse: 61 63 64 66  Temp:      TempSrc:      Resp: 18 18 18 18   Height:      Weight:  214 lb 15.2 oz (97.5 kg)    SpO2: 100% 100% 100% 100%   General:  Intubated and sedated Lungs:  Decreased breath sounds Heart:  Irregular Abdomen:  Decreased breath sounds Extremities:  No edema   LABS: No results found for: TROPONINI Results for orders placed or performed during the hospital encounter of 05/30/14 (from the past 24 hour(s))  Glucose, capillary     Status: Abnormal   Collection Time: 06/09/14  8:11 AM  Result Value Ref Range   Glucose-Capillary 115 (H) 70 - 99 mg/dL   Comment 1 Capillary Sample    Comment 2 Notify RN   Glucose, capillary     Status: Abnormal   Collection Time: 06/09/14 11:57 AM  Result Value Ref Range   Glucose-Capillary 114 (H) 70 - 99 mg/dL   Comment 1 Capillary Sample   Glucose, capillary     Status: Abnormal   Collection Time: 06/09/14  3:47 PM  Result Value Ref Range   Glucose-Capillary 124 (H) 70 - 99 mg/dL   Comment 1 Capillary Sample   Renal function panel (daily at 1600)     Status: Abnormal   Collection Time: 06/09/14  4:00 PM  Result Value Ref Range   Sodium 145 135 - 145 mmol/L   Potassium 3.2 (L) 3.5 - 5.1 mmol/L   Chloride 106 96 - 112 mEq/L   CO2 26 19 - 32 mmol/L   Glucose, Bld 135 (H) 70 - 99 mg/dL   BUN 90 (H) 6 - 23 mg/dL   Creatinine, Ser 4.59 (H) 0.50 - 1.35 mg/dL   Calcium 8.7 8.4 - 10.5 mg/dL   Phosphorus 5.6 (H) 2.3 - 4.6 mg/dL   Albumin 1.9 (L) 3.5 - 5.2 g/dL   GFR calc non Af Amer 12 (L) >90 mL/min   GFR calc Af Amer 14 (L) >90 mL/min   Anion gap 13 5 - 15  Glucose, capillary     Status: Abnormal   Collection Time: 06/09/14  7:23 PM  Result Value Ref Range   Glucose-Capillary 110 (H) 70 - 99 mg/dL   Comment 1 Documented in Chart    Comment 2 Notify RN     Glucose, capillary     Status: Abnormal   Collection Time: 06/09/14 11:53 PM  Result Value Ref Range   Glucose-Capillary 101 (H) 70 - 99 mg/dL   Comment 1 Arterial Sample    Comment 2 Documented in Chart    Comment 3 Notify RN   Glucose, capillary     Status: Abnormal   Collection Time: 06/10/14  3:58 AM  Result Value Ref Range   Glucose-Capillary 106 (H) 70 - 99 mg/dL   Comment 1 Arterial Sample    Comment 2 Documented in Chart    Comment 3 Notify RN   I-STAT 3, arterial blood gas (G3+)     Status: Abnormal   Collection Time: 06/10/14  4:03 AM  Result Value Ref Range   pH, Arterial 7.387 7.350 - 7.450   pCO2 arterial 44.9 35.0 - 45.0 mmHg   pO2, Arterial 86.0 80.0 - 100.0 mmHg   Bicarbonate 27.0 (H) 20.0 -  24.0 mEq/L   TCO2 28 0 - 100 mmol/L   O2 Saturation 96.0 %   Acid-Base Excess 2.0 0.0 - 2.0 mmol/L   Patient temperature 98.3 F    Collection site ARTERIAL LINE    Sample type ARTERIAL   Renal function panel (daily at 0500)     Status: Abnormal   Collection Time: 06/10/14  4:45 AM  Result Value Ref Range   Sodium 146 (H) 135 - 145 mmol/L   Potassium 2.9 (L) 3.5 - 5.1 mmol/L   Chloride 104 96 - 112 mEq/L   CO2 26 19 - 32 mmol/L   Glucose, Bld 106 (H) 70 - 99 mg/dL   BUN 98 (H) 6 - 23 mg/dL   Creatinine, Ser 4.78 (H) 0.50 - 1.35 mg/dL   Calcium 8.6 8.4 - 10.5 mg/dL   Phosphorus 5.4 (H) 2.3 - 4.6 mg/dL   Albumin 1.9 (L) 3.5 - 5.2 g/dL   GFR calc non Af Amer 11 (L) >90 mL/min   GFR calc Af Amer 13 (L) >90 mL/min   Anion gap 16 (H) 5 - 15  Lipid panel     Status: Abnormal   Collection Time: 06/10/14  4:45 AM  Result Value Ref Range   Cholesterol 74 0 - 200 mg/dL   Triglycerides 185 (H) <150 mg/dL   HDL 12 (L) >39 mg/dL   Total CHOL/HDL Ratio 6.2 RATIO   VLDL 37 0 - 40 mg/dL   LDL Cholesterol 25 0 - 99 mg/dL  CBC     Status: Abnormal   Collection Time: 06/10/14  4:45 AM  Result Value Ref Range   WBC 10.9 (H) 4.0 - 10.5 K/uL   RBC 3.39 (L) 4.22 - 5.81 MIL/uL    Hemoglobin 10.1 (L) 13.0 - 17.0 g/dL   HCT 30.4 (L) 39.0 - 52.0 %   MCV 89.7 78.0 - 100.0 fL   MCH 29.8 26.0 - 34.0 pg   MCHC 33.2 30.0 - 36.0 g/dL   RDW 15.5 11.5 - 15.5 %   Platelets 308 150 - 400 K/uL  Glucose, capillary     Status: None   Collection Time: 06/10/14  7:47 AM  Result Value Ref Range   Glucose-Capillary 87 70 - 99 mg/dL    Intake/Output Summary (Last 24 hours) at 06/10/14 0754 Last data filed at 06/10/14 0700  Gross per 24 hour  Intake 1111.14 ml  Output   2500 ml  Net -1388.86 ml     ASSESSMENT AND PLAN:   ATRIAL FIB:   Rate controlled.   Please restart warfarin VT per pharmacy if there is no contraindication from a surgical standpoint.    VDRF:    Per CCM.   ACUTE ON CHRONIC RENAL FAILURE:  Post OP ATN.    Good urine output.  No dialysis yesterday.  Followed by renal.   Minus Breeding 06/10/2014 7:54 AM

## 2014-06-10 NOTE — Progress Notes (Signed)
PT Cancellation Note  Patient Details Name: Barry Lawrence MRN: [id number] DOB: 12-Aug-1945   Cancelled Treatment:    Reason Eval/Treat Not Completed: Patient not medically ready Follow up for second attempt with PT evaluation. Spoke with nurse, states pt remains intubated and sedated. Will continue to follow and work with patient when he is medically able.  [name], [place] [name] 06/10/2014, 1:04 PM

## 2014-06-10 NOTE — Consult Note (Signed)
WOC wound consult note Reason for Consult: Evaluation of scrotal redness, generalized scrotal edema Wound type: Partial thickness wound, likely secondary to ruptured blister or scrotal edema Pressure Ulcer POA: N/A Measurement: 0.2cmX0.2cm  Wound bed: Area is now scabbed, no wound bed visible Drainage (amount, consistency, odor): No drainage or odor noted at this time Periwound: Skin is reddened, still has scrotal edema Dressing procedure/placement/frequency: Scrotum is edematous and reddened.  Small, yellow area scabbed over to the left side of his scrotum that appears to have possibly been a blister that ruptured and then scabbed over.  Skin is dry and flaky to this area as well.  Xeroform gauze applied over area to keep it moist. Recommend changing it daily to avoid drying out of gauze and/or prn for soiling.    Orson Gear, RN-BSN, Graduate Student Please re-consult if further assistance is needed.  Thank-you,  Julien Girt MSN, [place], [place], [place], [place]

## 2014-06-11 ENCOUNTER — Inpatient Hospital Stay (HOSPITAL_COMMUNITY): Payer: Medicare Other

## 2014-06-11 LAB — CBC
HCT: 30.4 % — ABNORMAL LOW (ref 39.0–52.0)
Hemoglobin: 10.1 g/dL — ABNORMAL LOW (ref 13.0–17.0)
MCH: 30.2 pg (ref 26.0–34.0)
MCHC: 33.2 g/dL (ref 30.0–36.0)
MCV: 91 fL (ref 78.0–100.0)
Platelets: 306 10*3/uL (ref 150–400)
RBC: 3.34 MIL/uL — ABNORMAL LOW (ref 4.22–5.81)
RDW: 15.5 % (ref 11.5–15.5)
WBC: 12.3 10*3/uL — ABNORMAL HIGH (ref 4.0–10.5)

## 2014-06-11 LAB — GLUCOSE, CAPILLARY
Glucose-Capillary: 92 mg/dL (ref 70–99)
Glucose-Capillary: 100 mg/dL — ABNORMAL HIGH (ref 70–99)
Glucose-Capillary: 118 mg/dL — ABNORMAL HIGH (ref 70–99)
Glucose-Capillary: 109 mg/dL — ABNORMAL HIGH (ref 70–99)
Glucose-Capillary: 99 mg/dL (ref 70–99)

## 2014-06-11 LAB — RENAL FUNCTION PANEL
Albumin: 1.9 g/dL — ABNORMAL LOW (ref 3.5–5.2)
Albumin: 1.8 g/dL — ABNORMAL LOW (ref 3.5–5.2)
Anion gap: 13 (ref 5–15)
Anion gap: 6 (ref 5–15)
BUN: 103 mg/dL — ABNORMAL HIGH (ref 6–23)
BUN: 109 mg/dL — ABNORMAL HIGH (ref 6–23)
CO2: 28 mmol/L (ref 19–32)
CO2: 34 mmol/L — ABNORMAL HIGH (ref 19–32)
Calcium: 8.4 mg/dL (ref 8.4–10.5)
Calcium: 8.3 mg/dL — ABNORMAL LOW (ref 8.4–10.5)
Chloride: 102 mEq/L (ref 96–112)
Chloride: 104 mEq/L (ref 96–112)
Creatinine, Ser: 4.91 mg/dL — ABNORMAL HIGH (ref 0.50–1.35)
Creatinine, Ser: 4.95 mg/dL — ABNORMAL HIGH (ref 0.50–1.35)
GFR calc Af Amer: 13 mL/min — ABNORMAL LOW (ref >90)
GFR calc Af Amer: 13 mL/min — ABNORMAL LOW (ref >90)
GFR calc non Af Amer: 11 mL/min — ABNORMAL LOW (ref >90)
GFR calc non Af Amer: 11 mL/min — ABNORMAL LOW (ref >90)
Glucose, Bld: 118 mg/dL — ABNORMAL HIGH (ref 70–99)
Glucose, Bld: 112 mg/dL — ABNORMAL HIGH (ref 70–99)
Phosphorus: 5.4 mg/dL — ABNORMAL HIGH (ref 2.3–4.6)
Phosphorus: 5.4 mg/dL — ABNORMAL HIGH (ref 2.3–4.6)
Potassium: 2.9 mmol/L — ABNORMAL LOW (ref 3.5–5.1)
Potassium: 3.1 mmol/L — ABNORMAL LOW (ref 3.5–5.1)
Sodium: 143 mmol/L (ref 135–145)
Sodium: 144 mmol/L (ref 135–145)

## 2014-06-11 LAB — CULTURE, RESPIRATORY

## 2014-06-11 LAB — CULTURE, RESPIRATORY W GRAM STAIN

## 2014-06-11 LAB — MAGNESIUM: Magnesium: 1.8 mg/dL (ref 1.5–2.5)

## 2014-06-11 MED ORDER — FREE WATER
300.0000 mL | Freq: Four times a day (QID) | Status: DC
  Administered 2014-06-11 – 2014-06-14 (×12): 300 mL

## 2014-06-11 MED ORDER — PANTOPRAZOLE SODIUM 40 MG PO PACK
40.0000 mg | PACK | ORAL | Status: DC
  Administered 2014-06-11 – 2014-06-18 (×8): 40 mg
  Filled 2014-06-11 (×9): qty 20

## 2014-06-11 MED ORDER — ACETAMINOPHEN 160 MG/5ML PO SOLN
650.0000 mg | Freq: Four times a day (QID) | ORAL | Status: DC | PRN
  Administered 2014-06-11 – 2014-06-27 (×11): 650 mg
  Filled 2014-06-11 (×11): qty 20.3

## 2014-06-11 MED ORDER — POTASSIUM CHLORIDE 10 MEQ/100ML IV SOLN
10.0000 meq | INTRAVENOUS | Status: AC
  Administered 2014-06-11 (×4): 10 meq via INTRAVENOUS
  Filled 2014-06-11 (×3): qty 100

## 2014-06-11 MED ORDER — PIPERACILLIN-TAZOBACTAM IN DEX 2-0.25 GM/50ML IV SOLN
2.2500 g | Freq: Three times a day (TID) | INTRAVENOUS | Status: DC
  Administered 2014-06-11 – 2014-06-14 (×8): 2.25 g via INTRAVENOUS
  Filled 2014-06-11 (×10): qty 50

## 2014-06-11 MED ORDER — VANCOMYCIN HCL IN DEXTROSE 1-5 GM/200ML-% IV SOLN
1000.0000 mg | INTRAVENOUS | Status: DC
  Administered 2014-06-11 – 2014-06-13 (×2): 1000 mg via INTRAVENOUS
  Filled 2014-06-11 (×2): qty 200

## 2014-06-11 NOTE — Progress Notes (Signed)
PULMONARY / CRITICAL CARE MEDICINE   Name: Barry Lawrence MRN: [place]:[id number] DOB: Nov 27, 1945    ADMISSION DATE:  05/30/2014 CONSULTATION DATE:  05/30/2014  REFERRING MD :  [name]   CHIEF COMPLAINT:  Vent management   INITIAL PRESENTATION:  69 yo male smoker for repair of AAA.  Remained on vent post-op.   PMHx of CAD s/p CABG, CKD III, OSA on CPAP, DM, HTN, mild COPD.  STUDIES:  None  SIGNIFICANT EVENTS: 1/07 OR>> elective repair AAA 1/10 transfuse 2 units PRBC, renal consulted 1/17 intubated urgently 1/18 bradycardia from precedex >> change to diprivan  SUBJECTIVE:  Tolerating pressure support better >> not able to go below 10/5.  VITAL SIGNS: Temp:  [97.5 F (36.4 C)-99.7 F (37.6 C)] 99.2 F (37.3 C) (01/19 0700) Pulse Rate:  [61-94] 72 (01/19 0801) Resp:  [16-26] 20 (01/19 0801) BP: (95-144)/(49-91) 129/66 mmHg (01/19 0801) SpO2:  [97 %-100 %] 100 % (01/19 0801) Arterial Line BP: (134-208)/(43-70) 180/58 mmHg (01/19 0700) FiO2 (%):  [40 %-50 %] 40 % (01/19 0801) Weight:  [213 lb 6.5 oz (96.8 kg)] 213 lb 6.5 oz (96.8 kg) (01/19 0352)   HEMODYNAMICS: CVP:  [7 mmHg-14 mmHg] 8 mmHg   VENTILATOR SETTINGS: Vent Mode:  [-] PRVC FiO2 (%):  [40 %-50 %] 40 % Set Rate:  [18 bmp] 18 bmp Vt Set:  [530 mL] 530 mL PEEP:  [5 cmH20] 5 cmH20 Plateau Pressure:  [23 cmH20-30 cmH20] 24 cmH20   INTAKE / OUTPUT:  Intake/Output Summary (Last 24 hours) at 06/11/14 0836 Last data filed at 06/11/14 0700  Gross per 24 hour  Intake 1668.73 ml  Output   3325 ml  Net -1656.27 ml   PHYSICAL EXAMINATION: General: no distress Neuro: RASS -1 HEENT: ETT in place Cardiovascular: irregular Lungs: scattered rhonchi Abdomen: soft, non tender Musculoskeletal: 1+ edema Skin: No rashes  LABS:  CBC  Recent Labs Lab 06/07/14 0410 06/10/14 0445 06/11/14 0350  WBC 7.4 10.9* 12.3*  HGB 10.2* 10.1* 10.1*  HCT 30.9* 30.4* 30.4*  PLT 222 308 306    Coag's  Recent Labs Lab  06/09/14 0353  INR 1.34   BMET  Recent Labs Lab 06/10/14 0445 06/10/14 1600 06/11/14 0350  NA 146* 143 143  K 2.9* 3.3* 2.9*  CL 104 104 102  CO2 26 26 28   BUN 98* 103* 103*  CREATININE 4.78* 4.88* 4.91*  GLUCOSE 106* 121* 118*   Electrolytes  Recent Labs Lab 06/06/14 0410  06/10/14 0445 06/10/14 1600 06/11/14 0350  CALCIUM 8.2*  8.2*  < > 8.6 8.3* 8.4  MG 2.0  --   --   --   --   PHOS 4.5  4.4  < > 5.4* 5.2* 5.4*  < > = values in this interval not displayed.   ABG  Recent Labs Lab 06/09/14 0400 06/09/14 0601 06/10/14 0403  PHART 7.275* 7.311* 7.387  PCO2ART 60.2* 55.1* 44.9  PO2ART 79.0* 157.0* 86.0   Liver Enzymes  Recent Labs Lab 06/10/14 0445 06/10/14 1600 06/11/14 0350  ALBUMIN 1.9* 1.9* 1.9*   Glucose  Recent Labs Lab 06/10/14 0358 06/10/14 0747 06/10/14 1131 06/10/14 1645 06/10/14 1939 06/11/14 0437  GLUCAP 106* 87 105* 105* 109* 92   Imaging Dg Chest Port 1 View  06/10/2014   CLINICAL DATA:  Respiratory failure/hypoxia  EXAM: PORTABLE CHEST - 1 VIEW  COMPARISON:  June 09, 2014  FINDINGS: Endotracheal tube tip is 6.7 cm above the carina. Central catheter tips are in the  superior vena cava. Nasogastric tube tip and side port is below the diaphragm. No pneumothorax. There is atelectatic change in the left lower lobe. Elsewhere lungs are clear. Heart is mildly enlarged with pulmonary vascularity within normal limits. No adenopathy. Patient is status post coronary artery bypass grafting.  IMPRESSION: Tube and catheter positions as described without pneumothorax. Left lower lobe atelectatic change. No change in cardiac silhouette. No new opacity.   Electronically Signed   By: Lowella Grip M.D.   On: 06/10/2014 07:29    ASSESSMENT / PLAN:  PULMONARY OETT 01/07 >>>1/16>>re intubated 1/17>> A: Acute respiratory failure with hypoxia following surgery for AAA repair. Hx of OSA, COPD. Re-intubated 1/17 due to airway protection and  WOB. P:   F/u CXR Goal negative fluid balance PRN BD's Will likely need trach  CARDIOVASCULAR L radial a line 01/07 >>> R Swan 01/07 >>> 1/09 Rt IJ CVL through cordis 1/09 >> A:  Hypovolemic/hemorrhagic shock after AAA repair >> resolved. Hx of CAD s/p CABG, HTN, HLD, PAD. P:  Coreg, lasix, norvasc  RENAL A:   AKI in setting of hypovolemic/hemorrhagic shock >> Urine Na 19, Urine creatinine 160 from 1/09. Non anion gap metabolic acidosis >> improved. CKD III >> baseline creatinine 1.8 to 2.1. Hypokalemia. P:   Monitor renal fx, urine outpt Replace electrolytes as needed  No indication for HD at present  GASTROINTESTINAL A:   Nutrition. Hx of GERD. P:   TF's running Protonix for SUP  HEMATOLOGIC A:  Acute blood loss anemia. Thrombocytopenia >> PLT 207 from 05/22/14. P: F/u CBC Transfuse for Hb < 7 Full dose lovenox  INFECTIOUS A:   Increased respiratory secretions >> no fever, or significant WBC elevation P:   Monitor off Abx  Sputum 1/17 >>   ENDOCRINE A:   No acute issues. P:   SSI while on tube feeds  NEUROLOGIC A:   Acute metabolic encephalopathy. P:   Diprivan, fentanyl RASS goal: 0   Summary: Will likely need trach to assist with vent weaning.  CC time 35 minutes.  Chesley Mires, MD [facility] Pulmonary/Critical Care 06/11/2014, 8:36 AM Pager:  [phone] After 3pm call: [phone]

## 2014-06-11 NOTE — Progress Notes (Signed)
[facility] ROUNDING NOTE   Subjective:   Intubated and sedated Having an excellent diuresis but still weeping from wounds Getting extra free water in TF and got 4 runs K yesterday  Objective:   BP 124/57 mmHg  Pulse 69  Temp(Src) 97.5 F (36.4 C) (Axillary)  Resp 18  Ht 5\' 7"  (1.702 m)  Wt 96.8 kg (213 lb 6.5 oz)  BMI 33.42 kg/m2  SpO2 99%  I/O last 3 completed shifts: In: 2287.3 [I.V.:927.3; NG/GT:960; IV Piggyback:400] Out: [id number] [Urine:4750]   Intubated, sedated on the vent.  OTT/NG/Left IJ trialysis catheter (1/13) VS as noted Lungs ant fairly clear S1S2 No S3 Abd incision red, groin incisions with macerated looking tissue at the edges then erythema surrounding that Edema 1+ LE to hips/+scrotal edema/serous edema fluid seeping from left groin wound Foley with clear yellow urine.   Recent Labs Lab 06/06/14 0410  06/09/14 0353 06/09/14 1600 06/10/14 0445 06/10/14 1600 06/11/14 0350  NA 144  144  < > 143 145 146* 143 143  K 4.0  4.0  < > 3.7 3.2* 2.9* 3.3* 2.9*  CL 110  110  < > 105 106 104 104 102  CO2 26  27  < > 29 26 26 26 28   GLUCOSE 105*  107*  < > 130* 135* 106* 121* 118*  BUN 63*  63*  < > 79* 90* 98* 103* 103*  CREATININE 4.59*  4.58*  < > 4.55* 4.59* 4.78* 4.88* 4.91*  CALCIUM 8.2*  8.2*  < > 8.7 8.7 8.6 8.3* 8.4  MG 2.0  --   --   --   --   --   --   PHOS 4.5  4.4  < > 6.4* 5.6* 5.4* 5.2* 5.4*  < > = values in this interval not displayed.   Recent Labs Lab 06/09/14 0353 06/09/14 1600 06/10/14 0445 06/10/14 1600 06/11/14 0350  ALBUMIN 2.1* 1.9* 1.9* 1.9* 1.9*   CBC:  Recent Labs Lab 06/05/14 0729 06/06/14 0410 06/07/14 0410 06/10/14 0445 06/11/14 0350  WBC 9.1 8.1 7.4 10.9* 12.3*  HGB 10.2* 10.4* 10.2* 10.1* 10.1*  HCT 30.0* 31.2* 30.9* 30.4* 30.4*  MCV 89.8 91.2 90.1 89.7 91.0  PLT 184 197 222 308 306    Recent Labs Lab 06/10/14 0747 06/10/14 1131 06/10/14 1645 06/10/14 1939 06/11/14 0437   GLUCAP 87 105* 105* 109* 92    Recent Labs  06/09/14 0353  LABPROT 16.8*  INR 1.34   Imaging: Dg Chest Port 1 View  06/10/2014   CLINICAL DATA:  Respiratory failure/hypoxia  EXAM: PORTABLE CHEST - 1 VIEW  COMPARISON:  June 09, 2014  FINDINGS: Endotracheal tube tip is 6.7 cm above the carina. Central catheter tips are in the superior vena cava. Nasogastric tube tip and side port is below the diaphragm. No pneumothorax. There is atelectatic change in the left lower lobe. Elsewhere lungs are clear. Heart is mildly enlarged with pulmonary vascularity within normal limits. No adenopathy. Patient is status post coronary artery bypass grafting.  IMPRESSION: Tube and catheter positions as described without pneumothorax. Left lower lobe atelectatic change. No change in cardiac silhouette. No new opacity.   Electronically Signed   By: Lowella Grip M.D.   On: 06/10/2014 07:29     Medications:  Infusions . sodium chloride 10 mL/hr at 06/11/14 0700  . fentaNYL infusion INTRAVENOUS 40 mcg/hr (06/11/14 0700)  . propofol 15 mcg/kg/min (06/11/14 0700)  Scheduled Medications . sodium chloride   Intravenous Once  .  amLODipine  10 mg Per Tube QAC breakfast  . antiseptic oral rinse  7 mL Mouth Rinse q12n4p  . carvedilol  6.25 mg Per Tube BID  . chlorhexidine  15 mL Mouth Rinse BID  . enoxaparin (LOVENOX) injection  1 mg/kg Subcutaneous Q24H  . feeding supplement (VITAL HIGH PROTEIN)  1,000 mL Per Tube Q24H  . free water  300 mL Per Tube Q6H  . furosemide  80 mg Intravenous Q6H  . insulin aspart  0-9 Units Subcutaneous 6 times per day  . pantoprazole (PROTONIX) IV  40 mg Intravenous Q24H  . potassium chloride  10 mEq Intravenous Q1 Hr x 4   acetaminophen **OR** acetaminophen, albuterol, fentaNYL, hydrALAZINE, LORazepam, ondansetron, sodium chloride  Assessment/ Plan:   69 yo male with background of CAD s/p CABG, history of R CEA/bilat LE stenting, HLD, tobacco use, CKD III (baseline  creatinine 1.8-2), OSA on CPAP, DM, HTN, mild COPD who developed ischemic ATN following aortobifemoral bypass graft and left renal artery bypass on 05/30/14 with pressor dependent hypotension/ABLA contributing. Required CRRT 06/05/14-06/07/14. Monitoring for recovery of function. Course otherwise complicated by VDRF (required reintubation 1/17)   Acute renal failure on chronic kidney disease stage III: s/p CRRT 1/13-1/15.  UOP has clearly increased and creatinine appears to have reached a plateau at 4.9 or so. He is without HD indications right now but I don't want to remove his catheter yet.  I have increased his free water rep[lacement and ordered additional potassium runs for him.  Although his CXR does not look especially wet, I want to continue diuresing him because his wound healing is currently compromised by his severe LE edema.  Anemia: Hb stable around 10. No ESA's.  Status post aortobifemoral bypass grafting and left renal artery bypass: Wounds with some necrosis. Wound care. Per VVS  VDRF: Ventilator management per CCM. Reintubated d/t inability to clear secretions. Per CCM.  Atrial fib appears rate controlled. Cardiology is following.  H/O CAD  HTN - stable  Jamal Maes, MD [facility] [phone] Pager 06/11/2014, 7:38 AM

## 2014-06-11 NOTE — Progress Notes (Signed)
ANTIBIOTIC CONSULT NOTE - initial dosing  Pharmacy Consult for Vancomycin and Zosyn Indication: wound infection  Allergies  Allergen Reactions  . Niaspan [Niacin Er] Hives    Patient Measurements: Height: 5\' 7"  (170.2 cm) Weight: 213 lb 6.5 oz (96.8 kg) IBW/kg (Calculated) : 66.1  Vital Signs: Temp: 101.4 F (38.6 C) (01/19 1936) Temp Source: Oral (01/19 1936) BP: 125/55 mmHg (01/19 1933) Pulse Rate: 74 (01/19 1933) Intake/Output from previous day: 01/18 0701 - 01/19 0700 In: 1736.9 [I.V.:586.9; NG/GT:750; IV Piggyback:400] Out: 3450 [Urine:3450] Intake/Output from this shift: Total I/O In: 30 [NG/GT:30] Out: -   Labs:  Recent Labs  06/10/14 0445 06/10/14 1600 06/11/14 0350 06/11/14 1545  WBC 10.9*  --  12.3*  --   HGB 10.1*  --  10.1*  --   PLT 308  --  306  --   CREATININE 4.78* 4.88* 4.91* 4.95*   Estimated Creatinine Clearance: 15.8 mL/min (by C-G formula based on Cr of 4.95).  Assessment:   POD #12 aortobifemoral bypass with left renal artery bypass.   Noted malodorous left groin wound with copious purulent drainage. Temp to 101.4, WBC 12.3.  Cultures sent, to begin Vanc and Zosyn.  ARF on CKD stage 3, s/p CRRT 1/13-1/15.  Serum creatinine 4.95 today, plateau.  Diuresing.  Goal of Therapy:  Vancomycin trough level 15-20 mcg/ml appropriate Zosyn dose for renal function and infection  Plan:   Vancomycin 1 gram IV q48hrs.  Zosyn 2.25 gm IV q8hrs.  Will follow renal function for any need to modify regimens.  Follow up culture data, progress.  Arty Baumgartner, [place] Pager: [phone] 06/11/2014,9:39 PM

## 2014-06-11 NOTE — Progress Notes (Addendum)
   [facility]   Subjective  - Intubated sedated   Objective 124/57 69 97.5 F (36.4 C) (Axillary) 18 99%  Intake/Output Summary (Last 24 hours) at 06/11/14 0727 Last data filed at 06/11/14 0700  Gross per 24 hour  Intake 1736.88 ml  Output   3450 ml  Net -1713.12 ml    Doppler biphasic PT bilateral DP left Abdomin soft, hypo BS Left groin incision maceration, right groin intact and abdominal incision C/D Lungs intubated Heart irregular   Assessment/Planning: POD # 12 aortobifem with left renal artery bypass  Cr 4.91 rising slowly UO 100 cc/hr on free H2O and lasix q 6 A fib Lovenox full dose Positive non-bloody BM 06/10/2014 C dif neg Maceration left groin incision dry guaze placed over bilateral groin incisions Intubated   Theda Sers, [facility]EN 06/11/2014 7:27 AM --  Laboratory Lab Results:  Recent Labs  06/10/14 0445 06/11/14 0350  WBC 10.9* 12.3*  HGB 10.1* 10.1*  HCT 30.4* 30.4*  PLT 308 306   BMET  Recent Labs  06/10/14 1600 06/11/14 0350  NA 143 143  K 3.3* 2.9*  CL 104 102  CO2 26 28  GLUCOSE 121* 118*  BUN 103* 103*  CREATININE 4.88* 4.91*  CALCIUM 8.3* 8.4    COAG Lab Results  Component Value Date   INR 1.34 06/09/2014   INR 1.21 06/04/2014   INR 1.46 05/31/2014   No results found for: PTT    Appreciate assistance from CCM, cardiology and nephrology.  Pulmonary: He remains intubated.  His x-ray looks slightly less wet than yesterday.  I would favor one more trial of extubation before proceeding with tracheostomy Cardiology: Atrophic which is rate controlled.  I would prefer him to be on heparin drip or subcutaneous Lovenox and weight to start Coumadin until he is closer to discharge Nephrology: Creatinine remained stable with good urine output.  He has not required dialysis for several days.  We'll continue to monitor. GI: Tolerating tube feeds. ID: No acute issues, although his secretions  are increasing.   Annamarie Major

## 2014-06-11 NOTE — Progress Notes (Signed)
Patient Name: Barry Lawrence Date of Encounter: 06/11/2014     Active Problems:   AAA (abdominal aortic aneurysm)   Acute respiratory failure with hypoxia   Post-operative state   Respiratory failure   Acute renal failure   Acute respiratory failure   Atrial fibrillation    SUBJECTIVE  Intubated and sedated.   CURRENT MEDS . amLODipine  10 mg Per Tube QAC breakfast  . antiseptic oral rinse  7 mL Mouth Rinse q12n4p  . carvedilol  6.25 mg Per Tube BID  . chlorhexidine  15 mL Mouth Rinse BID  . enoxaparin (LOVENOX) injection  1 mg/kg Subcutaneous Q24H  . feeding supplement (VITAL HIGH PROTEIN)  1,000 mL Per Tube Q24H  . free water  300 mL Per Tube Q6H  . furosemide  80 mg Intravenous Q6H  . insulin aspart  0-9 Units Subcutaneous 6 times per day  . pantoprazole sodium  40 mg Per Tube Q24H    OBJECTIVE  Filed Vitals:   06/11/14 0900 06/11/14 1000 06/11/14 1100 06/11/14 1125  BP: 126/68 94/46 112/55   Pulse: 74 72 74 72  Temp:    98.9 F (37.2 C)  TempSrc:    Axillary  Resp: 27 19 18 20   Height:      Weight:      SpO2: 99% 98% 100% 100%    Intake/Output Summary (Last 24 hours) at 06/11/14 1250 Last data filed at 06/11/14 1200  Gross per 24 hour  Intake 1914.78 ml  Output   3575 ml  Net -1660.22 ml   Filed Weights   06/09/14 0700 06/10/14 0600 06/11/14 0352  Weight: 217 lb 6 oz (98.6 kg) 214 lb 15.2 oz (97.5 kg) 213 lb 6.5 oz (96.8 kg)    PHYSICAL EXAM  General: Intubated and sedated Lungs: Decreased breath sounds Heart: Irregular Abdomen: Decreased breath sounds Extremities: No edema   Accessory Clinical Findings  CBC  Recent Labs  06/10/14 0445 06/11/14 0350  WBC 10.9* 12.3*  HGB 10.1* 10.1*  HCT 30.4* 30.4*  MCV 89.7 91.0  PLT 308 [id number]   Basic Metabolic Panel  Recent Labs  06/10/14 1600 06/11/14 0350  NA 143 143  K 3.3* 2.9*  CL 104 102  CO2 26 28  GLUCOSE 121* 118*  BUN 103* 103*  CREATININE 4.88* 4.91*  CALCIUM 8.3*  8.4  MG  --  1.8  PHOS 5.2* 5.4*   Liver Function Tests  Recent Labs  06/10/14 1600 06/11/14 0350  ALBUMIN 1.9* 1.9*  Fasting Lipid Panel  Recent Labs  06/10/14 0445  CHOL 74  HDL 12*  LDLCALC 25  TRIG 185*  CHOLHDL 6.2   TELE  afib w/ rate control  Radiology/Studies  Dg Chest Port 1 View  06/11/2014   CLINICAL DATA:  Respiratory failure.  EXAM: PORTABLE CHEST - 1 VIEW  COMPARISON:  06/10/2014.  FINDINGS: Endotracheal tube, bilateral IJ lines, NG tube in stable position. Stable cardiomegaly. Prior CABG. Interim partial clearing of pulmonary interstitial prominence suggesting clearing pulmonary interstitial edema. Small left pleural effusion cannot be excluded. No pneumothorax.  IMPRESSION: 1. Lines and tubes in stable position. 2. Interim partial clearing of pulmonary interstitial edema. Small left pleural effusion cannot be excluded. 3. Stable cardiomegaly.  Prior CABG.   Electronically Signed   By: Marcello Moores  Register   On: 06/11/2014 07:38   Dg Chest Port 1 View  06/10/2014   CLINICAL DATA:  Respiratory failure/hypoxia  EXAM: PORTABLE CHEST - 1 VIEW  COMPARISON:  June 09, 2014  FINDINGS: Endotracheal tube tip is 6.7 cm above the carina. Central catheter tips are in the superior vena cava. Nasogastric tube tip and side port is below the diaphragm. No pneumothorax. There is atelectatic change in the left lower lobe. Elsewhere lungs are clear. Heart is mildly enlarged with pulmonary vascularity within normal limits. No adenopathy. Patient is status post coronary artery bypass grafting.  IMPRESSION: Tube and catheter positions as described without pneumothorax. Left lower lobe atelectatic change. No change in cardiac silhouette. No new opacity.   Electronically Signed   By: Lowella Grip M.D.   On: 06/10/2014 07:29   Portable Chest Xray  06/09/2014   CLINICAL DATA:  Acute onset of shortness of breath. Respiratory failure. Endotracheal tube placement. Initial encounter.  EXAM:  PORTABLE CHEST - 1 VIEW  COMPARISON:  Chest radiograph performed 06/08/2014  FINDINGS: The patient's endotracheal tube is seen ending 4 cm above the carina. A right IJ line is noted ending about the mid SVC. A left IJ line is also noted ending about the mid SVC. An enteric tube is noted extending below the diaphragm.  Persistent retrocardiac airspace opacity raises concern for pneumonia, though mild interstitial edema might have a similar appearance. Increased interstitial markings are seen. No pleural effusion or pneumothorax is identified.  The cardiomediastinal silhouette is mildly enlarged. The patient is status post median sternotomy, with evidence of prior CABG. No acute osseous abnormalities are identified.  IMPRESSION: 1. Endotracheal tube seen ending 4 cm above the carina. 2. Persistent retrocardiac airspace opacity and increased interstitial markings may reflect pneumonia or possibly mild interstitial edema. This is perhaps mildly more prominent than on the prior study. 3. Mild cardiomegaly.   Electronically Signed   By: Garald Balding M.D.   On: 06/09/2014 05:33   Dg Chest Port 1 View  06/08/2014   CLINICAL DATA:  Respiratory distress.  Shortness of breath.  Cough.  EXAM: PORTABLE CHEST - 1 VIEW  COMPARISON:  Chest radiograph 06/06/2014 at 0628 hr  FINDINGS: The endotracheal tube is no longer seen. The enteric tube remains in place, tip below the diaphragm not included in the field of view. Left and right central lines remain in the mid SVC. Patient is post median sternotomy. There is stable cardiomegaly. Decreased atelectasis and hazy opacity at the right lung base. Persistent left basilar retrocardiac opacity. Pulmonary vasculature is stable, no overt edema.  IMPRESSION: 1. Increasing right basilar opacity with decreasing atelectasis. 2. Stable cardiomegaly and left retrocardiac opacity, atelectasis versus pneumonia.   Electronically Signed   By: Jeb Levering M.D.   On: 06/08/2014 02:55   Dg  Chest Port 1 View  06/06/2014   CLINICAL DATA:  69 year old male with respiratory failure, recent operative aortobifemoral bypass graft, left renal artery bypass. Initial encounter.  EXAM: PORTABLE CHEST - 1 VIEW  COMPARISON:  06/05/2014 and earlier.  FINDINGS: Portable AP semi upright view at 0628 hrs. Stable endotracheal tube. Stable bilateral are IJ vascular catheters. Enteric tube courses to the abdomen, tip not included.  Stable lung volumes. Stable cardiac size and mediastinal contours. Sequelae of CABG. No pneumothorax. Continued confluent retrocardiac opacity with no large pleural effusion. Superimposed perihilar basilar predominant streaky opacity, no overt edema.  IMPRESSION: 1.  Stable lines and tubes. 2. Stable ventilation with streaky perihilar opacity superimposed on lower lobe collapse or consolidation.   Electronically Signed   By: Lars Pinks M.D.   On: 06/06/2014 07:46   Dg Chest Port 1 View  06/05/2014  CLINICAL DATA:  Post left IJ dialysis catheter insertion.  EXAM: PORTABLE CHEST - 1 VIEW  COMPARISON:  06/05/2014  FINDINGS: Left dialysis catheter placement with the tip in the SVC. Endotracheal tube, right central line, and NG tube are unchanged. There is cardiomegaly with vascular congestion. Bilateral lower lobe airspace opacities are again noted, stable. Possible small left effusion, stable. No right effusion.  IMPRESSION: Left dialysis catheter placement with the tip in the SVC. No pneumothorax. Otherwise no real change.   Electronically Signed   By: Rolm Baptise M.D.   On: 06/05/2014 15:06   Dg Chest Port 1 View  06/05/2014   CLINICAL DATA:  Intubated  EXAM: PORTABLE CHEST - 1 VIEW  COMPARISON:  06/04/2014  FINDINGS: Cardiomediastinal silhouette is stable. Again noted status post median sternotomy and CABG. Endotracheal tube in place with tip 4.5 cm above the carina. Stable NG tube position. Persistent small left pleural effusion with left basilar atelectasis or infiltrate. Central  mild vascular congestion without convincing pulmonary edema. Right IJ central line is unchanged in position.  IMPRESSION: Stable support apparatus. Persistent small left pleural effusion with left basilar atelectasis or infiltrate. Central mild vascular congestion without convincing pulmonary edema.   Electronically Signed   By: Lahoma Crocker M.D.   On: 06/05/2014 09:01   Dg Chest Port 1 View  06/04/2014   CLINICAL DATA:  Ventilator dependent respiratory failure.  EXAM: PORTABLE CHEST - 1 VIEW  COMPARISON:  06/03/2014  FINDINGS: Patient slightly rotated left. Sternotomy wires unchanged. Endotracheal tube has tip 5.4 cm above the carinal. Enteric tube courses into the region of the stomach and off the inferior portion of the film. Right IJ venous catheter unchanged with tip overlying SVC.  Persistent opacification over the left base likely a combination of pleural fluid and atelectasis although cannot exclude infection. Improved aeration over the right base. Stable mild cardiomegaly. Remainder of the exam is unchanged.  IMPRESSION: Persistent left base opacification likely effusion with atelectasis although cannot exclude infection.  Tubes and lines as described.   Electronically Signed   By: Marin Olp M.D.   On: 06/04/2014 08:10   Dg Chest Port 1 View  06/03/2014   CLINICAL DATA:  CHF.  EXAM: PORTABLE CHEST - 1 VIEW  COMPARISON:  06/02/2014.  FINDINGS: Tracheostomy tube, right IJ line, NG tube in stable position. Stable cardiomegaly. Prior CABG. Interim partial clearing of pulmonary interstitial edema. Persistent small left pleural effusion. No pneumothorax.  IMPRESSION: 1. Lines and tubes in stable position. 2. Persistent cardiomegaly. Prior CABG. Interim partial clearing of pulmonary interstitial edema. Persistent small left pleural effusion .   Electronically Signed   By: Marcello Moores  Register   On: 06/03/2014 07:55   Dg Chest Port 1 View  06/02/2014   CLINICAL DATA:  69 year old male intubated ICU patient.   EXAM: PORTABLE CHEST - 1 VIEW  COMPARISON:  Chest x-ray 06/01/2014.  FINDINGS: An endotracheal tube is in place with tip 6.0 cm above the carina. Right IJ central venous catheter with tip at the superior cavoatrial junction. A nasogastric tube is seen extending into the stomach, however, the tip of the nasogastric tube extends below the lower margin of the image. Lung volumes are low. Cephalization of the pulmonary vasculature with indistinct interstitial markings in lungs bilaterally suggesting a background of mild interstitial pulmonary edema. In addition, there are more focal opacities throughout the right mid to lower lung, concerning for suture post areas of airspace consolidation, potentially from infection. Bibasilar opacities are also  present, presumably atelectatic. Small bilateral pleural effusions. Heart size is mildly enlarged. Upper mediastinal contours are within normal limits for the patient's lordotic positioning and portable technique. Extensive atherosclerosis in the thoracic aorta. Status post median sternotomy for CABG, including LIMA.  IMPRESSION: 1. Postoperative changes and support apparatus, as above. 2. Worsening aeration compared to yesterday's examination, concerning for combination of developing pulmonary edema, and potential superimposed infection in the right mid to lower lung. There are also areas of bibasilar subsegmental atelectasis and small bilateral pleural effusions. 3. Atherosclerosis.   Electronically Signed   By: Vinnie Langton M.D.   On: 06/02/2014 09:18   Dg Chest Port 1 View  06/01/2014   CLINICAL DATA:  Central line complication, initial encounter  EXAM: PORTABLE CHEST - 1 VIEW  COMPARISON:  06/01/2014 at 6:44 a.m.  FINDINGS: Moderate cardiac enlargement. Status post CABG. No change endotracheal tube. No change orogastric tube. Right Swan-Ganz central venous catheter has been retracted and now is seen with tip over the cavoatrial junction.  Vascular congestion  identified with persistent right mid lung discoid atelectasis. Mild interstitial prominence suggesting an element of interstitial edema, which represents change from prior study.  IMPRESSION: Retraction of right Swan-Ganz central venous catheter.  Atelectasis right mid lung zone with development of mild interstitial pulmonary edema.   Electronically Signed   By: Skipper Cliche M.D.   On: 06/01/2014 20:59   Dg Chest Port 1 View  06/01/2014   CLINICAL DATA:  Acute respiratory failure and hypoxia. Subsequent encounter.  EXAM: PORTABLE CHEST - 1 VIEW  COMPARISON:  Chest radiograph performed 05/31/2014  FINDINGS: The patient's endotracheal tube is seen ending 5 cm above the carina. An enteric tube is seen extending below the diaphragm. A right IJ Swan-Ganz catheter is seen ending about the right main pulmonary artery.  There has been interval improvement in right upper lobe airspace opacification, reflecting improving pneumonia. No pleural effusion or pneumothorax is seen.  The cardiomediastinal silhouette is mildly enlarged. The patient is status post mid sternotomy, with evidence of prior CABG. No acute osseous abnormalities are identified.  IMPRESSION: Interval improvement in right upper lobe pneumonia.   Electronically Signed   By: Garald Balding M.D.   On: 06/01/2014 08:24   Dg Chest Port 1 View  05/31/2014   CLINICAL DATA:  Status post aortofemoral bypass graft.  EXAM: PORTABLE CHEST - 1 VIEW  COMPARISON:  May 30, 2014.  FINDINGS: Status post coronary artery bypass graft. Endotracheal and nasogastric tubes are unchanged in position. Right internal jugular Swan-Ganz catheter is noted with tip directed into the right pulmonary artery. Stable right upper lobe opacity is noted most consistent with right upper lobe atelectasis or pneumonia. No pneumothorax or significant pleural effusion is noted.  IMPRESSION: Stable support apparatus. Stable right upper lobe opacity is noted most consistent with pneumonia or  atelectasis of the right upper lobe.   Electronically Signed   By: Sabino Dick M.D.   On: 05/31/2014 08:04   Dg Chest Port 1 View  05/30/2014   CLINICAL DATA:  Evaluate position of Swan-Ganz catheter after adjustment.  EXAM: PORTABLE CHEST - 1 VIEW  COMPARISON:  05/30/2014  FINDINGS: Postoperative changes in the mediastinum. Endotracheal tube tip measures 4.7 cm above the carinal. Enteric tube tip is off the field of view but below the left hemidiaphragm. Swan-Ganz catheter tip projects over the right main pulmonary artery. Interval development of collapse or consolidation of the right upper lung. No blunting of costophrenic angles. Left lung  is clear. Heart size and pulmonary vascularity are normal. Calcified and tortuous aorta.  IMPRESSION: Appliances appear to be in satisfactory position. Interval development of collapse or consolidation of the right upper lung.   Electronically Signed   By: Lucienne Capers M.D.   On: 05/30/2014 21:23   Portable Chest Xray  05/30/2014   CLINICAL DATA:  Status post aortic aneurysm repair. Respiratory failure.  EXAM: PORTABLE CHEST - 1 VIEW  COMPARISON:  Chest CT 03/05/2014  FINDINGS: Endotracheal tube is 6.4 cm from the carina. Enteric tube in place, tip below the diaphragm not included in the field of view. Right internal jugular Swan-Ganz catheter tip in the region of the pulmonary outflow tract. Patient is post median sternotomy. The heart size is normal. No definite chest tubes are seen. There is no pleural effusion or pneumothorax. Minimal vascular congestion. No pulmonary edema.  IMPRESSION: 1. Endotracheal tube, enteric tube, and Swan-Ganz catheter in place. 2. Minimal vascular congestion.   Electronically Signed   By: Jeb Levering M.D.   On: 05/30/2014 18:09   Dg Abd Portable 1v  06/04/2014   CLINICAL DATA:  Gastric tube placement  EXAM: PORTABLE ABDOMEN - 1 VIEW  COMPARISON:  Portable exam 1854 hr compared to CT abdomen and pelvis 03/05/2014  FINDINGS: Tip of  gastric tube projects over expected position of the proximal descending duodenum.  Bowel gas pattern normal.  Prior median sternotomy.  Opacified LEFT lung base.  Osseous demineralization.  IMPRESSION: Tip of gastric tube projects over expected position of the proximal descending duodenum.   Electronically Signed   By: Lavonia Dana M.D.   On: 06/04/2014 19:24    ASSESSMENT AND PLAN 69 yo male with background of CAD s/p CABG, history of R CEA/bilat LE stenting, HLD, tobacco use, CKD III (baseline creatinine 1.8-2), OSA on CPAP, DM, HTN, mild COPD who developed ischemic ATN following aortobifemoral bypass graft and left renal artery bypass on 05/30/14 with pressor dependent hypotension/ABLA contributing. Required CRRT 06/05/14-06/07/14. Monitoring for recovery of function. Course otherwise complicated by VDRF (required reintubation 1/17). He has also had some atrial fib with RVR for which cardiology was consulted.   ATRIAL FIB: Rate controlled. Please restart warfarin VT per pharmacy if there is no contraindication from a surgical standpoint.   VDRF: Per CCM.   ACUTE ON CHRONIC RENAL FAILURE: Post OP ATN. Good urine output. No dialysis yesterday. Followed by renal.  Signed, Angelena Form R PA-C  Pager [phone]   History and all data above reviewed.  Patient examined.  I agree with the findings as above.  The patient exam reveals [id number],  Lungs:  Decreased breath sounds  ,  Abd: Decreased bowel sounds, Ext Trace edema  .  All available labs, radiology testing, previous records reviewed. Agree with documented assessment and plan. Atrial fib:  Rate is currently controlled. On Lovenox.  Oral anticoagulation when OK with surgery.     Jeneen Rinks Noa Galvao  2:16 PM  06/11/2014

## 2014-06-11 NOTE — Progress Notes (Signed)
[facility] Physician-Brief Progress Note Patient Name: CAYLOB CHAVANA DOB: Dec 22, 1945 MRN: [id number]   Date of Service  06/11/2014  HPI/Events of Note  Called by RN re: fever to 101.4. RN reports malodorous L groin wound which was examined over camera - it has copious purulent appearing drainage  eICU Interventions  Wound culture Blood culture Vanc/pip-tazo per pharm     Intervention Category Major Interventions: Infection - evaluation and management  Tajinder Dingley 06/11/2014, 8:18 PM

## 2014-06-11 NOTE — Progress Notes (Signed)
NUTRITION FOLLOW UP  Intervention:    Continue TF Vital High Protein at 10 ml/h. TF will provide 240 kcals, 21 gm protein, 201 ml free water daily.  When able to advance TF, recommend increasing by 10 ml every 4 hours to goal rate of 50 ml/h with Prostat BID.  New goal rate with calories from Propofol to provide 1804 kcal, 135 g protein, and 1008 mL of free water.   RD will continue to monitor.   Nutrition Dx:   Inadequate oral intake related to inability to eat as evidenced by NPO status, ongoing.  Goal:   Pt to meet >/= 90% of their estimated nutrition needs; not met  Monitor:   TF tolerance/adequacy, weight trend, labs, vent status.  Assessment:   69 yo male active smoker with extensive PMH including hx CAD s/p CABG, CKD III, OSA on CPAP, DM, HTN, COPD, stable L apex 35m nodule (followed by wert) presented 1/7 for elective repair of 5.6cm AAA. Remains intubated post op.   Pt currently on trickle feeds. Per RN, pt is tolerating. TF has not been advanced. Per RN, bowel sounds are improving. Recommend increasing TF when medically able recommend increasing TF to Vital HP @ 50 ml/hr with Prostat BID to provide, with calories from Propofol, 1804 kcal, 135 g protein and 1003 mL of water.   Patient is currently intubated on ventilator support MV: 9.8 L/min Temp (24hrs), Avg:98.8 F (37.1 C), Min:97.5 F (36.4 C), Max:99.7 F (37.6 C)  Propofol: 15.3 ml/hr to provide additional 404 kcal  Height: Ht Readings from Last 1 Encounters:  05/30/14 _0  (1.702 m)    Weight Status:   Wt Readings from Last 1 Encounters:  06/11/14 213 lb 6.5 oz (96.8 kg)   05/30/14 197 lb 9 oz (89.614 kg)   Re-estimated needs:  Kcal: 1825 Protein: 135-160 gm Fluid: >/= 2 L  Skin: closed abdominal incision, closed right and left groin incisions  Diet Order: Diet NPO time specified   Intake/Output Summary (Last 24 hours) at 06/11/14 1235 Last data filed at 06/11/14 1200  Gross per 24 hour   Intake 2014.78 ml  Output   3575 ml  Net -1560.22 ml    Last BM: 1/19   Labs:   Recent Labs Lab 06/06/14 0410  06/10/14 0445 06/10/14 1600 06/11/14 0350  NA 144  144  < > 146* 143 143  K 4.0  4.0  < > 2.9* 3.3* 2.9*  CL 110  110  < > 104 104 102  CO2 26  27  < > _1 BUN 63*  63*  < > 98* 103* 103*  CREATININE 4.59*  4.58*  < > 4.78* 4.88* 4.91*  CALCIUM 8.2*  8.2*  < > 8.6 8.3* 8.4  MG 2.0  --   --   --  1.8  PHOS 4.5  4.4  < > 5.4* 5.2* 5.4*  GLUCOSE 105*  107*  < > 106* 121* 118*  < > = values in this interval not displayed.  CBG (last 3)   Recent Labs  06/11/14 0437 06/11/14 0754 06/11/14 1128  GLUCAP 92 100* 118*    Scheduled Meds: . amLODipine  10 mg Per Tube QAC breakfast  . antiseptic oral rinse  7 mL Mouth Rinse q12n4p  . carvedilol  6.25 mg Per Tube BID  . chlorhexidine  15 mL Mouth Rinse BID  . enoxaparin (LOVENOX) injection  1 mg/kg Subcutaneous Q24H  . feeding supplement (VITAL HIGH  PROTEIN)  1,000 mL Per Tube Q24H  . free water  300 mL Per Tube Q6H  . furosemide  80 mg Intravenous Q6H  . insulin aspart  0-9 Units Subcutaneous 6 times per day  . pantoprazole sodium  40 mg Per Tube Q24H    Continuous Infusions: . sodium chloride 10 mL/hr at 06/11/14 1200  . fentaNYL infusion INTRAVENOUS 40 mcg/hr (06/11/14 1200)  . propofol 20 mcg/kg/min (06/11/14 1200)    Laurette Schimke MS, RD, LDN

## 2014-06-11 NOTE — Progress Notes (Signed)
PT Cancellation Note  Patient Details Name: Barry Lawrence MRN: [id number] DOB: 04/12/1946   Cancelled Treatment:    Reason Eval/Treat Not Completed: Medical issues which prohibited therapy (pt remains on vent and not yet medically appropriate, will check next date)   Melford Aase 06/11/2014, 8:05 AM Elwyn Reach, [place]

## 2014-06-12 ENCOUNTER — Inpatient Hospital Stay (HOSPITAL_COMMUNITY): Payer: Medicare Other

## 2014-06-12 LAB — RENAL FUNCTION PANEL
Albumin: 1.9 g/dL — ABNORMAL LOW (ref 3.5–5.2)
Albumin: 1.9 g/dL — ABNORMAL LOW (ref 3.5–5.2)
Anion gap: 14 (ref 5–15)
Anion gap: 15 (ref 5–15)
BUN: 111 mg/dL — ABNORMAL HIGH (ref 6–23)
BUN: 112 mg/dL — ABNORMAL HIGH (ref 6–23)
CO2: 32 mmol/L (ref 19–32)
CO2: 31 mmol/L (ref 19–32)
Calcium: 8.4 mg/dL (ref 8.4–10.5)
Calcium: 8.3 mg/dL — ABNORMAL LOW (ref 8.4–10.5)
Chloride: 99 mEq/L (ref 96–112)
Chloride: 99 mEq/L (ref 96–112)
Creatinine, Ser: 4.91 mg/dL — ABNORMAL HIGH (ref 0.50–1.35)
Creatinine, Ser: 5 mg/dL — ABNORMAL HIGH (ref 0.50–1.35)
GFR calc Af Amer: 13 mL/min — ABNORMAL LOW (ref >90)
GFR calc Af Amer: 12 mL/min — ABNORMAL LOW (ref >90)
GFR calc non Af Amer: 11 mL/min — ABNORMAL LOW (ref >90)
GFR calc non Af Amer: 11 mL/min — ABNORMAL LOW (ref >90)
Glucose, Bld: 121 mg/dL — ABNORMAL HIGH (ref 70–99)
Glucose, Bld: 110 mg/dL — ABNORMAL HIGH (ref 70–99)
Phosphorus: 5.6 mg/dL — ABNORMAL HIGH (ref 2.3–4.6)
Phosphorus: 5.7 mg/dL — ABNORMAL HIGH (ref 2.3–4.6)
Potassium: 3 mmol/L — ABNORMAL LOW (ref 3.5–5.1)
Potassium: 2.9 mmol/L — ABNORMAL LOW (ref 3.5–5.1)
Sodium: 145 mmol/L (ref 135–145)
Sodium: 145 mmol/L (ref 135–145)

## 2014-06-12 LAB — GLUCOSE, CAPILLARY
Glucose-Capillary: 101 mg/dL — ABNORMAL HIGH (ref 70–99)
Glucose-Capillary: 102 mg/dL — ABNORMAL HIGH (ref 70–99)
Glucose-Capillary: 103 mg/dL — ABNORMAL HIGH (ref 70–99)
Glucose-Capillary: 130 mg/dL — ABNORMAL HIGH (ref 70–99)
Glucose-Capillary: 98 mg/dL (ref 70–99)
Glucose-Capillary: 98 mg/dL (ref 70–99)

## 2014-06-12 LAB — MAGNESIUM: Magnesium: 1.7 mg/dL (ref 1.5–2.5)

## 2014-06-12 MED ORDER — POTASSIUM CHLORIDE 10 MEQ/50ML IV SOLN
10.0000 meq | INTRAVENOUS | Status: AC
  Administered 2014-06-12 (×4): 10 meq via INTRAVENOUS
  Filled 2014-06-12: qty 50

## 2014-06-12 NOTE — Progress Notes (Signed)
Micro alert to eMD by eRN   - trach aspirate with Abundant H Flu    Results for orders placed or performed during the hospital encounter of 05/30/14  Culture, respiratory (NON-Expectorated)     Status: None   Collection Time: 06/02/14 12:43 PM  Result Value Ref Range Status   Specimen Description ENDOTRACHEAL  Final   Special Requests NONE  Final   Gram Stain   Final    RARE WBC PRESENT,BOTH PMN AND MONONUCLEAR NO SQUAMOUS EPITHELIAL CELLS SEEN NO ORGANISMS SEEN Performed at [facility]    Culture   Final    NO GROWTH 2 DAYS Performed at [facility]    Report Status 06/04/2014 FINAL  Final  Culture, respiratory (NON-Expectorated)     Status: None   Collection Time: 06/09/14 11:39 AM  Result Value Ref Range Status   Specimen Description TRACHEAL ASPIRATE  Final   Special Requests NONE  Final   Gram Stain   Final    RARE WBC PRESENT, PREDOMINANTLY PMN NO SQUAMOUS EPITHELIAL CELLS SEEN FEW GRAM POSITIVE COCCI IN PAIRS Performed at [facility]    Culture   Final    ABUNDANT HAEMOPHILUS INFLUENZAE Note: BETA LACTAMASE POSITIVE Performed at [facility]    Report Status 06/11/2014 FINAL  Final  Clostridium Difficile by PCR     Status: None   Collection Time: 06/10/14 10:15 AM  Result Value Ref Range Status   C difficile by pcr NEGATIVE NEGATIVE Final      [place]ren abx Anti-infectives    Start     Dose/Rate Route Frequency Ordered Stop   06/11/14 2200  vancomycin (VANCOCIN) IVPB 1000 mg/200 mL premix     1,000 mg200 mL/hr over 60 Minutes Intravenous Every 48 hours 06/11/14 2058     06/11/14 2130  piperacillin-tazobactam (ZOSYN) IVPB 2.25 g     2.25 g100 mL/hr over 30 Minutes Intravenous 3 times per day 06/11/14 2058     05/31/14 0100  cefUROXime (ZINACEF) 1.5 g in dextrose 5 % 50 mL IVPB     1.5 g100 mL/hr over 30 Minutes Intravenous Every 12 hours 05/30/14 1732 05/31/14 1235   05/30/14 1330  cefUROXime (ZINACEF) 1.5 g in dextrose 5 %  50 mL IVPB  Status:  Discontinued     1.5 g100 mL/hr over 30 Minutes Intravenous To Surgery 05/30/14 1326 05/30/14 1716   05/29/14 1455  cefUROXime (ZINACEF) 1.5 g in dextrose 5 % 50 mL IVPB     1.5 g100 mL/hr over 30 Minutes Intravenous 30 min pre-op 05/29/14 1455 05/30/14 1343       A H FLu in trach aspirate But also fever new and L groin wound that is malodorous  P Continue vanc and zosyn that was started several hours ago   Dr. Brand Males, M.D., [facility].C.P Pulmonary and Critical Care Medicine Staff Physician [place] [facility] Pager: [phone], If no answer or between  15:00h - 7:00h: call [phone]  06/12/2014 12:38 AM

## 2014-06-12 NOTE — Progress Notes (Signed)
Patient Name: Barry Lawrence Date of Encounter: 06/12/2014     Active Problems:   AAA (abdominal aortic aneurysm)   Acute respiratory failure with hypoxia   Post-operative state   Respiratory failure   Acute renal failure   Acute respiratory failure   Atrial fibrillation    SUBJECTIVE  Sedated but will open eye to stimulation   CURRENT MEDS . amLODipine  10 mg Per Tube QAC breakfast  . antiseptic oral rinse  7 mL Mouth Rinse q12n4p  . carvedilol  6.25 mg Per Tube BID  . chlorhexidine  15 mL Mouth Rinse BID  . enoxaparin (LOVENOX) injection  1 mg/kg Subcutaneous Q24H  . feeding supplement (VITAL HIGH PROTEIN)  1,000 mL Per Tube Q24H  . free water  300 mL Per Tube Q6H  . furosemide  80 mg Intravenous Q6H  . insulin aspart  0-9 Units Subcutaneous 6 times per day  . pantoprazole sodium  40 mg Per Tube Q24H  . piperacillin-tazobactam (ZOSYN)  IV  2.25 g Intravenous 3 times per day  . vancomycin  1,000 mg Intravenous Q48H    OBJECTIVE  Filed Vitals:   06/12/14 0600 06/12/14 0700 06/12/14 0739 06/12/14 0757  BP: 105/50 128/65 113/59   Pulse: 68 78 70   Temp:    98.9 F (37.2 C)  TempSrc:    Axillary  Resp: 18 16 18    Height:      Weight:      SpO2: 98% 100% 100%     Intake/Output Summary (Last 24 hours) at 06/12/14 1018 Last data filed at 06/12/14 0700  Gross per 24 hour  Intake 1594.2 ml  Output   2990 ml  Net -1395.8 ml   Filed Weights   06/10/14 0600 06/11/14 0352 06/12/14 0407  Weight: 214 lb 15.2 oz (97.5 kg) 213 lb 6.5 oz (96.8 kg) 216 lb 0.8 oz (98 kg)    PHYSICAL EXAM  General: Intubated and sedated Lungs: Decreased breath sounds Heart: Irregular Abdomen: Decreased breath sounds Extremities: No edema   Accessory Clinical Findings  CBC  Recent Labs  06/10/14 0445 06/11/14 0350  WBC 10.9* 12.3*  HGB 10.1* 10.1*  HCT 30.4* 30.4*  MCV 89.7 91.0  PLT 308 [id number]   Basic Metabolic Panel  Recent Labs  06/11/14 0350 06/11/14 1545  06/12/14 0430  NA 143 144 145  K 2.9* 3.1* 3.0*  CL 102 104 99  CO2 28 34* 32  GLUCOSE 118* 112* 121*  BUN 103* 109* 111*  CREATININE 4.91* 4.95* 4.91*  CALCIUM 8.4 8.3* 8.4  MG 1.8  --  1.7  PHOS 5.4* 5.4* 5.6*   Liver Function Tests  Recent Labs  06/11/14 1545 06/12/14 0430  ALBUMIN 1.8* 1.9*  Fasting Lipid Panel  Recent Labs  06/10/14 0445  CHOL 74  HDL 12*  LDLCALC 25  TRIG 185*  CHOLHDL 6.2   TELE  afib w/ rate control  Radiology/Studies  Dg Chest Port 1 View  06/12/2014   CLINICAL DATA:  Ventilator dependent respiratory failure  EXAM: PORTABLE CHEST - 1 VIEW  COMPARISON:  06/11/2014  FINDINGS: Endotracheal tube, left IJ dialysis catheter, right IJ central venous catheter, and nasogastric tube are stable in position. Previous CABG. Heart size upper limits normal for technique. Mild central vascular congestion persists. No definite effusion. Atheromatous aorta. Patchy atelectasis or infiltrate in the right mid lung has developed since previous exam.  IMPRESSION: 1. New right mid lung atelectasis or infiltrate. 2.  Support hardware stable  in position.   Electronically Signed   By: Arne Cleveland M.D.   On: 06/12/2014 08:08   Dg Chest Port 1 View  06/11/2014   CLINICAL DATA:  Respiratory failure.  EXAM: PORTABLE CHEST - 1 VIEW  COMPARISON:  06/10/2014.  FINDINGS: Endotracheal tube, bilateral IJ lines, NG tube in stable position. Stable cardiomegaly. Prior CABG. Interim partial clearing of pulmonary interstitial prominence suggesting clearing pulmonary interstitial edema. Small left pleural effusion cannot be excluded. No pneumothorax.  IMPRESSION: 1. Lines and tubes in stable position. 2. Interim partial clearing of pulmonary interstitial edema. Small left pleural effusion cannot be excluded. 3. Stable cardiomegaly.  Prior CABG.   Electronically Signed   By: Marcello Moores  Register   On: 06/11/2014 07:38   Dg Chest Port 1 View  06/10/2014   CLINICAL DATA:  Respiratory  failure/hypoxia  EXAM: PORTABLE CHEST - 1 VIEW  COMPARISON:  June 09, 2014  FINDINGS: Endotracheal tube tip is 6.7 cm above the carina. Central catheter tips are in the superior vena cava. Nasogastric tube tip and side port is below the diaphragm. No pneumothorax. There is atelectatic change in the left lower lobe. Elsewhere lungs are clear. Heart is mildly enlarged with pulmonary vascularity within normal limits. No adenopathy. Patient is status post coronary artery bypass grafting.  IMPRESSION: Tube and catheter positions as described without pneumothorax. Left lower lobe atelectatic change. No change in cardiac silhouette. No new opacity.   Electronically Signed   By: Lowella Grip M.D.   On: 06/10/2014 07:29   Portable Chest Xray  06/09/2014   CLINICAL DATA:  Acute onset of shortness of breath. Respiratory failure. Endotracheal tube placement. Initial encounter.  EXAM: PORTABLE CHEST - 1 VIEW  COMPARISON:  Chest radiograph performed 06/08/2014  FINDINGS: The patient's endotracheal tube is seen ending 4 cm above the carina. A right IJ line is noted ending about the mid SVC. A left IJ line is also noted ending about the mid SVC. An enteric tube is noted extending below the diaphragm.  Persistent retrocardiac airspace opacity raises concern for pneumonia, though mild interstitial edema might have a similar appearance. Increased interstitial markings are seen. No pleural effusion or pneumothorax is identified.  The cardiomediastinal silhouette is mildly enlarged. The patient is status post median sternotomy, with evidence of prior CABG. No acute osseous abnormalities are identified.  IMPRESSION: 1. Endotracheal tube seen ending 4 cm above the carina. 2. Persistent retrocardiac airspace opacity and increased interstitial markings may reflect pneumonia or possibly mild interstitial edema. This is perhaps mildly more prominent than on the prior study. 3. Mild cardiomegaly.   Electronically Signed   By:  Garald Balding M.D.   On: 06/09/2014 05:33   Dg Chest Port 1 View  06/08/2014   CLINICAL DATA:  Respiratory distress.  Shortness of breath.  Cough.  EXAM: PORTABLE CHEST - 1 VIEW  COMPARISON:  Chest radiograph 06/06/2014 at 0628 hr  FINDINGS: The endotracheal tube is no longer seen. The enteric tube remains in place, tip below the diaphragm not included in the field of view. Left and right central lines remain in the mid SVC. Patient is post median sternotomy. There is stable cardiomegaly. Decreased atelectasis and hazy opacity at the right lung base. Persistent left basilar retrocardiac opacity. Pulmonary vasculature is stable, no overt edema.  IMPRESSION: 1. Increasing right basilar opacity with decreasing atelectasis. 2. Stable cardiomegaly and left retrocardiac opacity, atelectasis versus pneumonia.   Electronically Signed   By: Jeb Levering M.D.   On:  06/08/2014 02:55   Dg Chest Port 1 View  06/06/2014   CLINICAL DATA:  69 year old male with respiratory failure, recent operative aortobifemoral bypass graft, left renal artery bypass. Initial encounter.  EXAM: PORTABLE CHEST - 1 VIEW  COMPARISON:  06/05/2014 and earlier.  FINDINGS: Portable AP semi upright view at 0628 hrs. Stable endotracheal tube. Stable bilateral are IJ vascular catheters. Enteric tube courses to the abdomen, tip not included.  Stable lung volumes. Stable cardiac size and mediastinal contours. Sequelae of CABG. No pneumothorax. Continued confluent retrocardiac opacity with no large pleural effusion. Superimposed perihilar basilar predominant streaky opacity, no overt edema.  IMPRESSION: 1.  Stable lines and tubes. 2. Stable ventilation with streaky perihilar opacity superimposed on lower lobe collapse or consolidation.   Electronically Signed   By: Lars Pinks M.D.   On: 06/06/2014 07:46   Dg Chest Port 1 View  06/05/2014   CLINICAL DATA:  Post left IJ dialysis catheter insertion.  EXAM: PORTABLE CHEST - 1 VIEW  COMPARISON:   06/05/2014  FINDINGS: Left dialysis catheter placement with the tip in the SVC. Endotracheal tube, right central line, and NG tube are unchanged. There is cardiomegaly with vascular congestion. Bilateral lower lobe airspace opacities are again noted, stable. Possible small left effusion, stable. No right effusion.  IMPRESSION: Left dialysis catheter placement with the tip in the SVC. No pneumothorax. Otherwise no real change.   Electronically Signed   By: Rolm Baptise M.D.   On: 06/05/2014 15:06   Dg Chest Port 1 View  06/05/2014   CLINICAL DATA:  Intubated  EXAM: PORTABLE CHEST - 1 VIEW  COMPARISON:  06/04/2014  FINDINGS: Cardiomediastinal silhouette is stable. Again noted status post median sternotomy and CABG. Endotracheal tube in place with tip 4.5 cm above the carina. Stable NG tube position. Persistent small left pleural effusion with left basilar atelectasis or infiltrate. Central mild vascular congestion without convincing pulmonary edema. Right IJ central line is unchanged in position.  IMPRESSION: Stable support apparatus. Persistent small left pleural effusion with left basilar atelectasis or infiltrate. Central mild vascular congestion without convincing pulmonary edema.   Electronically Signed   By: Lahoma Crocker M.D.   On: 06/05/2014 09:01   Dg Chest Port 1 View  06/04/2014   CLINICAL DATA:  Ventilator dependent respiratory failure.  EXAM: PORTABLE CHEST - 1 VIEW  COMPARISON:  06/03/2014  FINDINGS: Patient slightly rotated left. Sternotomy wires unchanged. Endotracheal tube has tip 5.4 cm above the carinal. Enteric tube courses into the region of the stomach and off the inferior portion of the film. Right IJ venous catheter unchanged with tip overlying SVC.  Persistent opacification over the left base likely a combination of pleural fluid and atelectasis although cannot exclude infection. Improved aeration over the right base. Stable mild cardiomegaly. Remainder of the exam is unchanged.   IMPRESSION: Persistent left base opacification likely effusion with atelectasis although cannot exclude infection.  Tubes and lines as described.   Electronically Signed   By: Marin Olp M.D.   On: 06/04/2014 08:10   Dg Chest Port 1 View  06/03/2014   CLINICAL DATA:  CHF.  EXAM: PORTABLE CHEST - 1 VIEW  COMPARISON:  06/02/2014.  FINDINGS: Tracheostomy tube, right IJ line, NG tube in stable position. Stable cardiomegaly. Prior CABG. Interim partial clearing of pulmonary interstitial edema. Persistent small left pleural effusion. No pneumothorax.  IMPRESSION: 1. Lines and tubes in stable position. 2. Persistent cardiomegaly. Prior CABG. Interim partial clearing of pulmonary interstitial edema. Persistent small  left pleural effusion .   Electronically Signed   By: Marcello Moores  Register   On: 06/03/2014 07:55   Dg Chest Port 1 View  06/02/2014   CLINICAL DATA:  69 year old male intubated ICU patient.  EXAM: PORTABLE CHEST - 1 VIEW  COMPARISON:  Chest x-ray 06/01/2014.  FINDINGS: An endotracheal tube is in place with tip 6.0 cm above the carina. Right IJ central venous catheter with tip at the superior cavoatrial junction. A nasogastric tube is seen extending into the stomach, however, the tip of the nasogastric tube extends below the lower margin of the image. Lung volumes are low. Cephalization of the pulmonary vasculature with indistinct interstitial markings in lungs bilaterally suggesting a background of mild interstitial pulmonary edema. In addition, there are more focal opacities throughout the right mid to lower lung, concerning for suture post areas of airspace consolidation, potentially from infection. Bibasilar opacities are also present, presumably atelectatic. Small bilateral pleural effusions. Heart size is mildly enlarged. Upper mediastinal contours are within normal limits for the patient's lordotic positioning and portable technique. Extensive atherosclerosis in the thoracic aorta. Status post median  sternotomy for CABG, including LIMA.  IMPRESSION: 1. Postoperative changes and support apparatus, as above. 2. Worsening aeration compared to yesterday's examination, concerning for combination of developing pulmonary edema, and potential superimposed infection in the right mid to lower lung. There are also areas of bibasilar subsegmental atelectasis and small bilateral pleural effusions. 3. Atherosclerosis.   Electronically Signed   By: Vinnie Langton M.D.   On: 06/02/2014 09:18   Dg Chest Port 1 View  06/01/2014   CLINICAL DATA:  Central line complication, initial encounter  EXAM: PORTABLE CHEST - 1 VIEW  COMPARISON:  06/01/2014 at 6:44 a.m.  FINDINGS: Moderate cardiac enlargement. Status post CABG. No change endotracheal tube. No change orogastric tube. Right Swan-Ganz central venous catheter has been retracted and now is seen with tip over the cavoatrial junction.  Vascular congestion identified with persistent right mid lung discoid atelectasis. Mild interstitial prominence suggesting an element of interstitial edema, which represents change from prior study.  IMPRESSION: Retraction of right Swan-Ganz central venous catheter.  Atelectasis right mid lung zone with development of mild interstitial pulmonary edema.   Electronically Signed   By: Skipper Cliche M.D.   On: 06/01/2014 20:59   Dg Chest Port 1 View  06/01/2014   CLINICAL DATA:  Acute respiratory failure and hypoxia. Subsequent encounter.  EXAM: PORTABLE CHEST - 1 VIEW  COMPARISON:  Chest radiograph performed 05/31/2014  FINDINGS: The patient's endotracheal tube is seen ending 5 cm above the carina. An enteric tube is seen extending below the diaphragm. A right IJ Swan-Ganz catheter is seen ending about the right main pulmonary artery.  There has been interval improvement in right upper lobe airspace opacification, reflecting improving pneumonia. No pleural effusion or pneumothorax is seen.  The cardiomediastinal silhouette is mildly enlarged. The  patient is status post mid sternotomy, with evidence of prior CABG. No acute osseous abnormalities are identified.  IMPRESSION: Interval improvement in right upper lobe pneumonia.   Electronically Signed   By: Garald Balding M.D.   On: 06/01/2014 08:24   Dg Chest Port 1 View  05/31/2014   CLINICAL DATA:  Status post aortofemoral bypass graft.  EXAM: PORTABLE CHEST - 1 VIEW  COMPARISON:  May 30, 2014.  FINDINGS: Status post coronary artery bypass graft. Endotracheal and nasogastric tubes are unchanged in position. Right internal jugular Swan-Ganz catheter is noted with tip directed into the right  pulmonary artery. Stable right upper lobe opacity is noted most consistent with right upper lobe atelectasis or pneumonia. No pneumothorax or significant pleural effusion is noted.  IMPRESSION: Stable support apparatus. Stable right upper lobe opacity is noted most consistent with pneumonia or atelectasis of the right upper lobe.   Electronically Signed   By: Sabino Dick M.D.   On: 05/31/2014 08:04   Dg Chest Port 1 View  05/30/2014   CLINICAL DATA:  Evaluate position of Swan-Ganz catheter after adjustment.  EXAM: PORTABLE CHEST - 1 VIEW  COMPARISON:  05/30/2014  FINDINGS: Postoperative changes in the mediastinum. Endotracheal tube tip measures 4.7 cm above the carinal. Enteric tube tip is off the field of view but below the left hemidiaphragm. Swan-Ganz catheter tip projects over the right main pulmonary artery. Interval development of collapse or consolidation of the right upper lung. No blunting of costophrenic angles. Left lung is clear. Heart size and pulmonary vascularity are normal. Calcified and tortuous aorta.  IMPRESSION: Appliances appear to be in satisfactory position. Interval development of collapse or consolidation of the right upper lung.   Electronically Signed   By: Lucienne Capers M.D.   On: 05/30/2014 21:23   Portable Chest Xray  05/30/2014   CLINICAL DATA:  Status post aortic aneurysm repair.  Respiratory failure.  EXAM: PORTABLE CHEST - 1 VIEW  COMPARISON:  Chest CT 03/05/2014  FINDINGS: Endotracheal tube is 6.4 cm from the carina. Enteric tube in place, tip below the diaphragm not included in the field of view. Right internal jugular Swan-Ganz catheter tip in the region of the pulmonary outflow tract. Patient is post median sternotomy. The heart size is normal. No definite chest tubes are seen. There is no pleural effusion or pneumothorax. Minimal vascular congestion. No pulmonary edema.  IMPRESSION: 1. Endotracheal tube, enteric tube, and Swan-Ganz catheter in place. 2. Minimal vascular congestion.   Electronically Signed   By: Jeb Levering M.D.   On: 05/30/2014 18:09   Dg Abd Portable 1v  06/04/2014   CLINICAL DATA:  Gastric tube placement  EXAM: PORTABLE ABDOMEN - 1 VIEW  COMPARISON:  Portable exam 1854 hr compared to CT abdomen and pelvis 03/05/2014  FINDINGS: Tip of gastric tube projects over expected position of the proximal descending duodenum.  Bowel gas pattern normal.  Prior median sternotomy.  Opacified LEFT lung base.  Osseous demineralization.  IMPRESSION: Tip of gastric tube projects over expected position of the proximal descending duodenum.   Electronically Signed   By: Lavonia Dana M.D.   On: 06/04/2014 19:24    ASSESSMENT AND PLAN 70 yo male with background of CAD s/p CABG, history of R CEA/bilat LE stenting, HLD, tobacco use, CKD III (baseline creatinine 1.8-2), OSA on CPAP, DM, HTN, mild COPD who developed ischemic ATN following aortobifemoral bypass graft and left renal artery bypass on 05/30/14 with pressor dependent hypotension/ABLA contributing. Required CRRT 06/05/14-06/07/14. Monitoring for recovery of function. Course otherwise complicated by VDRF (required reintubation 1/17). He has also had some atrial fib with RVR for which cardiology was consulted.   ATRIAL FIB: Rate controlled. 90-100s. On Lovenox.  Oral anticoagulation when OK with surgery.     Please restart  warfarin VT per pharmacy if there is no contraindication from a surgical standpoint.   VDRF:Per CCM.   ACUTE ON CHRONIC RENAL FAILURE: Post OP ATN. Followed by renal.  Signed, Eileen Stanford PA-C  Pager [phone]

## 2014-06-12 NOTE — Progress Notes (Signed)
PT Cancellation Note  Patient Details Name: Barry Lawrence MRN: [id number] DOB: Jan 30, 1946   Cancelled Treatment:    Reason Eval/Treat Not Completed: Patient not medically ready Patient remains of vent, and has bed rest orders at this time. Please update activity orders when you feel patient is ready for physical therapy services.  [place], [name] 06/12/2014, 8:49 AM

## 2014-06-12 NOTE — Progress Notes (Signed)
PULMONARY / CRITICAL CARE MEDICINE   Name: Barry Lawrence MRN: [id number] DOB: Mar 17, 1946    ADMISSION DATE:  05/30/2014 CONSULTATION DATE:  05/30/2014  REFERRING MD :  [name]   CHIEF COMPLAINT:  Vent management   INITIAL PRESENTATION:  69 yo male smoker for repair of AAA.  Remained on vent post-op.   PMHx of CAD s/p CABG, CKD III, OSA on CPAP, DM, HTN, mild COPD.  STUDIES:  None  SIGNIFICANT EVENTS: 1/07 OR>> elective repair AAA 1/10 transfuse 2 units PRBC, renal consulted 1/17 intubated urgently 1/18 bradycardia from precedex >> change to diprivan 1/19 Fever >> add Abx  SUBJECTIVE:  Abx started yesterday.  VITAL SIGNS: Temp:  [98.9 F (37.2 C)-101.4 F (38.6 C)] 98.9 F (37.2 C) (01/20 0757) Pulse Rate:  [62-78] 70 (01/20 0739) Resp:  [16-27] 18 (01/20 0739) BP: (84-130)/(46-68) 113/59 mmHg (01/20 0739) SpO2:  [98 %-100 %] 100 % (01/20 0739) Arterial Line BP: (132-183)/(49-59) 132/49 mmHg (01/19 1000) FiO2 (%):  [40 %] 40 % (01/20 0739) Weight:  [216 lb 0.8 oz (98 kg)] 216 lb 0.8 oz (98 kg) (01/20 0407)   HEMODYNAMICS: CVP:  [5 mmHg-10 mmHg] 8 mmHg   VENTILATOR SETTINGS: Vent Mode:  [-] PSV FiO2 (%):  [40 %] 40 % Set Rate:  [18 bmp] 18 bmp Vt Set:  [530 mL] 530 mL PEEP:  [5 cmH20] 5 cmH20 Pressure Support:  [15 cmH20-20 cmH20] 20 cmH20 Plateau Pressure:  [18 cmH20-21 cmH20] 18 cmH20   INTAKE / OUTPUT:  Intake/Output Summary (Last 24 hours) at 06/12/14 0829 Last data filed at 06/12/14 0700  Gross per 24 hour  Intake 1920.9 ml  Output   3315 ml  Net -1394.1 ml   PHYSICAL EXAMINATION: General: no distress Neuro: RASS -1 HEENT: ETT in place Cardiovascular: irregular Lungs: scattered rhonchi Abdomen: soft, non tender Musculoskeletal: 1+ edema Skin: drainage from Lt groin site GU: scrotal edema  LABS:  CBC  Recent Labs Lab 06/07/14 0410 06/10/14 0445 06/11/14 0350  WBC 7.4 10.9* 12.3*  HGB 10.2* 10.1* 10.1*  HCT 30.9* 30.4* 30.4*  PLT 222  308 306    Coag's  Recent Labs Lab 06/09/14 0353  INR 1.34   BMET  Recent Labs Lab 06/11/14 0350 06/11/14 1545 06/12/14 0430  NA 143 144 145  K 2.9* 3.1* 3.0*  CL 102 104 99  CO2 28 34* 32  BUN 103* 109* 111*  CREATININE 4.91* 4.95* 4.91*  GLUCOSE 118* 112* 121*   Electrolytes  Recent Labs Lab 06/06/14 0410  06/11/14 0350 06/11/14 1545 06/12/14 0430  CALCIUM 8.2*  8.2*  < > 8.4 8.3* 8.4  MG 2.0  --  1.8  --  1.7  PHOS 4.5  4.4  < > 5.4* 5.4* 5.6*  < > = values in this interval not displayed.   ABG  Recent Labs Lab 06/09/14 0400 06/09/14 0601 06/10/14 0403  PHART 7.275* 7.311* 7.387  PCO2ART 60.2* 55.1* 44.9  PO2ART 79.0* 157.0* 86.0   Liver Enzymes  Recent Labs Lab 06/11/14 0350 06/11/14 1545 06/12/14 0430  ALBUMIN 1.9* 1.8* 1.9*   Glucose  Recent Labs Lab 06/11/14 1128 06/11/14 1604 06/11/14 1924 06/11/14 2359 06/12/14 0345 06/12/14 0754  GLUCAP 118* 109* 99 130* 102* 101*   Imaging Dg Chest Port 1 View  06/11/2014   CLINICAL DATA:  Respiratory failure.  EXAM: PORTABLE CHEST - 1 VIEW  COMPARISON:  06/10/2014.  FINDINGS: Endotracheal tube, bilateral IJ lines, NG tube in stable position. Stable cardiomegaly.  Prior CABG. Interim partial clearing of pulmonary interstitial prominence suggesting clearing pulmonary interstitial edema. Small left pleural effusion cannot be excluded. No pneumothorax.  IMPRESSION: 1. Lines and tubes in stable position. 2. Interim partial clearing of pulmonary interstitial edema. Small left pleural effusion cannot be excluded. 3. Stable cardiomegaly.  Prior CABG.   Electronically Signed   By: Marcello Moores  Register   On: 06/11/2014 07:38    ASSESSMENT / PLAN:  PULMONARY OETT 01/07 >>>1/16>>re intubated 1/17>> A: Acute respiratory failure with hypoxia following surgery for AAA repair. Hx of OSA, COPD. Re-intubated 1/17 due to airway protection and WOB. P:   F/u CXR Goal negative fluid balance PRN BD's Will likely  need trach if family is agreeable  CARDIOVASCULAR R Swan 01/07 >> 1/09 Rt IJ CVL through cordis 1/09 >> Lt IJ HD cath 1/14 >> A:  Hypovolemic/hemorrhagic shock after AAA repair >> resolved. Hx of CAD s/p CABG, HTN, HLD, PAD. P:  Coreg, lasix, norvasc  RENAL A:   AKI in setting of hypovolemic/hemorrhagic shock >> Urine Na 19, Urine creatinine 160 from 1/09. Non anion gap metabolic acidosis >> improved. CKD III >> baseline creatinine 1.8 to 2.1. Hypokalemia. P:   Monitor renal fx, urine outpt Replace electrolytes as needed  No indication for HD at present  GASTROINTESTINAL A:   Nutrition. Hx of GERD. P:   TF's running Protonix for SUP  HEMATOLOGIC A:  Acute blood loss anemia. Thrombocytopenia >> PLT 207 from 05/22/14. P: F/u CBC Transfuse for Hb < 7 Full dose lovenox  INFECTIOUS A:   Fever >> possible sources HCAP and Lt groin wound. P:   Day 2 vancomycin, zosyn Wound care  Blood 1/19 >> Wound 1/19 >>  ENDOCRINE A:   No acute issues. P:   SSI while on tube feeds  NEUROLOGIC A:   Acute metabolic encephalopathy. P:   Diprivan, fentanyl RASS goal: 0   Summary: New fever with concern for HCAP and wound infection.  Day 14 on vent.  If family is agreeable, he would need trach >> otherwise need to discuss goals of care since he is not likely to be ready to come off vent on his own anytime soon.  CC time 35 minutes.  Chesley Mires, MD [facility] Pulmonary/Critical Care 06/12/2014, 8:29 AM Pager:  [phone] After 3pm call: [phone]

## 2014-06-12 NOTE — Progress Notes (Signed)
Increased PS from 15 to 20 d/t high RR > 35 on 15 ps.

## 2014-06-12 NOTE — Progress Notes (Signed)
[facility] Progress Note Patient Name: Barry Lawrence DOB: [birth year] MRN: [id number]   Date of Service  06/12/2014  HPI/Events of Note    eICU Interventions  K+ repleted     Intervention Category Intermediate Interventions: Electrolyte abnormality - evaluation and management  Sebastian Filmore 06/12/2014, 6:37 PM

## 2014-06-12 NOTE — Progress Notes (Addendum)
   [facility]  Subjective  - Intubated possible plan for extubation today.   Objective 113/59 70 98.9 F (37.2 C) (Axillary) 18 100%  Intake/Output Summary (Last 24 hours) at 06/12/14 0908 Last data filed at 06/12/14 0700  Gross per 24 hour  Intake   1790 ml  Output   3190 ml  Net  -1400 ml    Left groin yellow eschar, moist, SS drainage dry 4 x 4 keep dry Right groin and abdominal incision dry Abdomin soft, continues scrotal edema Spontaneous movement of all extremities PT doppler signals biphasic bil Heart irregular    Assessment/Planning: POD # 13 open AAA with left renal bypass Left groin yellow eschar dry guaze WBC 12.3 06/11/2014 he is on Zosyn and Vancomycin IV started yesterday temp 101      COLLINS, EMMA MAUREEN 06/12/2014 9:08 AM --    Left groin wound macerated with fairly continuous serous drainage.  Some superficial debridement at bedside today, no abscess, doubt source of fever as this area is well drained although poorly healing.  Hopefully improve with nutrition.  Vanc and Zosyn empirically  Vent per CCM fairly minimal settings possible pneumonia  Wet to dry dressings left groin Advance tube feeds, severe protein calorie malnutrition  Renal function marginal but continues to be non oliguric Nephrology following  Ruta Hinds, MD [facility] [place]: [phone] Pager: [phone]   Laboratory Lab Results:  Recent Labs  06/10/14 0445 06/11/14 0350  WBC 10.9* 12.3*  HGB 10.1* 10.1*  HCT 30.4* 30.4*  PLT 308 306   BMET  Recent Labs  06/11/14 1545 06/12/14 0430  NA 144 145  K 3.1* 3.0*  CL 104 99  CO2 34* 32  GLUCOSE 112* 121*  BUN 109* 111*  CREATININE 4.95* 4.91*  CALCIUM 8.3* 8.4    COAG Lab Results  Component Value Date   INR 1.34 06/09/2014   INR 1.21 06/04/2014   INR 1.46 05/31/2014   No results found for: PTT

## 2014-06-12 NOTE — Consult Note (Signed)
WOC wound consult note Reason for Consult: reconsulted for scrotum, but this is unchanged from our consult Monday.  Consulted for left groin, however [facility] is managing these wounds.  Wound type:surgical left groin; edema related scrotal wound Dressing procedure/placement/frequency: Dressing changes for the left groin, normal saline wet to dry; barrier cream for the scrotum to protect from further injury.  Discussed POC with patient and bedside nurse.  Re consult if needed, will not follow at this time. Thanks  Kiyon Fidalgo [employer], [place] [phone])

## 2014-06-12 NOTE — Progress Notes (Signed)
[facility] ROUNDING NOTE   Subjective:   Intubated and sedated Continues to have excellent urine output despite creatinine near 5 (with lasix) Now with fevers, malodorous wounds and HFlu from trach aspirate No renal replacement Rx since 1/15. Getting free water via tube Keeping sedated as gets very agitated  Objective:   BP 113/59 mmHg  Pulse 70  Temp(Src) 100.5 F (38.1 C) (Oral)  Resp 18  Ht 5\' 7"  (1.702 m)  Wt 98 kg (216 lb 0.8 oz)  BMI 33.83 kg/m2  SpO2 100%  I/O last 3 completed shifts: In: 2866.2 [I.V.:916.2; NG/GT:1350; IV [id number] Out: [id number] [Urine:5465]   Intubated, sedated on the vent.  OTT/NG/Left IJ trialysis catheter (1/13) VS as noted Lungs ant clear though BS coarse S1S2 No S3 Abd incision red, groin incisions with macerated looking tissue at the edges then erythema surrounding that/malodorous/lots of drainage Edema 1+ LE to hips/+scrotal edema/serous edema fluid seeping from the groin wounds Foley still draining yellow urine  Weight Trending 1/7 89.6 kg 1/10 105.5 kg 1/12 108.2 kg 1/13 108.9 kg   CRRT started 1/14 106.9 kg 1/15 104.6 kg   CRRT stopped 1/16 102 kg 1/17 98.6 kg 1/19 96.8 kg 1/120 98 kg  Recent Labs Lab 06/06/14 0410  06/10/14 0445 06/10/14 1600 06/11/14 0350 06/11/14 1545 06/12/14 0430  NA 144  144  < > 146* 143 143 144 145  K 4.0  4.0  < > 2.9* 3.3* 2.9* 3.1* 3.0*  CL 110  110  < > 104 104 102 104 99  CO2 26  27  < > 26 26 28  34* 32  GLUCOSE 105*  107*  < > 106* 121* 118* 112* 121*  BUN 63*  63*  < > 98* 103* 103* 109* 111*  CREATININE 4.59*  4.58*  < > 4.78* 4.88* 4.91* 4.95* 4.91*  CALCIUM 8.2*  8.2*  < > 8.6 8.3* 8.4 8.3* 8.4  MG 2.0  --   --   --  1.8  --  1.7  PHOS 4.5  4.4  < > 5.4* 5.2* 5.4* 5.4* 5.6*  < > = values in this interval not displayed.   Recent Labs Lab 06/10/14 0445 06/10/14 1600 06/11/14 0350 06/11/14 1545 06/12/14 0430  ALBUMIN 1.9* 1.9* 1.9* 1.8* 1.9*    CBC:  Recent Labs Lab 06/06/14 0410 06/07/14 0410 06/10/14 0445 06/11/14 0350  WBC 8.1 7.4 10.9* 12.3*  HGB 10.4* 10.2* 10.1* 10.1*  HCT 31.2* 30.9* 30.4* 30.4*  MCV 91.2 90.1 89.7 91.0  PLT 197 222 308 306    Recent Labs Lab 06/11/14 1128 06/11/14 1604 06/11/14 1924 06/11/14 2359 06/12/14 0345  GLUCAP 118* 109* 99 130* 102*   No results for input(s): LABPROT, INR in the last 72 hours. Imaging: Dg Chest Port 1 View  06/11/2014   CLINICAL DATA:  Respiratory failure.  EXAM: PORTABLE CHEST - 1 VIEW  COMPARISON:  06/10/2014.  FINDINGS: Endotracheal tube, bilateral IJ lines, NG tube in stable position. Stable cardiomegaly. Prior CABG. Interim partial clearing of pulmonary interstitial prominence suggesting clearing pulmonary interstitial edema. Small left pleural effusion cannot be excluded. No pneumothorax.  IMPRESSION: 1. Lines and tubes in stable position. 2. Interim partial clearing of pulmonary interstitial edema. Small left pleural effusion cannot be excluded. 3. Stable cardiomegaly.  Prior CABG.   Electronically Signed   By: Marcello Moores  Register   On: 06/11/2014 07:38     Medications:   Infusions . sodium chloride 10 mL/hr at 06/12/14 0400  .  fentaNYL infusion INTRAVENOUS 40 mcg/hr (06/12/14 0400)  . propofol 20 mcg/kg/min (06/12/14 0400)   Scheduled Medications . amLODipine  10 mg Per Tube QAC breakfast  . antiseptic oral rinse  7 mL Mouth Rinse q12n4p  . carvedilol  6.25 mg Per Tube BID  . chlorhexidine  15 mL Mouth Rinse BID  . enoxaparin (LOVENOX) injection  1 mg/kg Subcutaneous Q24H  . feeding supplement (VITAL HIGH PROTEIN)  1,000 mL Per Tube Q24H  . free water  300 mL Per Tube Q6H  . furosemide  80 mg Intravenous Q6H  . insulin aspart  0-9 Units Subcutaneous 6 times per day  . pantoprazole sodium  40 mg Per Tube Q24H  . piperacillin-tazobactam (ZOSYN)  IV  2.25 g Intravenous 3 times per day  . vancomycin  1,000 mg Intravenous Q48H   acetaminophen (TYLENOL)  oral liquid 160 mg/5 mL, albuterol, fentaNYL, hydrALAZINE, LORazepam, ondansetron, sodium chloride  Assessment/ Plan:   69 yo male with background of CAD s/p CABG, history of R CEA/bilat LE stenting, HLD, tobacco use, CKD III (baseline creatinine 1.8-2), OSA on CPAP, DM, HTN, mild COPD who developed ischemic ATN following aortobifemoral bypass graft and left renal artery bypass on 05/30/14 with pressor dependent hypotension/ABLA contributing. Required CRRT 06/05/14-06/07/14. Monitoring for recovery of function. Course otherwise complicated by VDRF (required reintubation 1/17), infected groin wounds and HFlu from trach aspirate   Acute renal failure on chronic kidney disease stage III: s/p CRRT 1/13-1/15.  UOP is excellent. Weight slowly dropping. Creatinine "stalled" at around 4.9, electrolytes OK except for low K (requiring daily replacement on lasix) and borderline hypernatremia (getting extra free water via tube) Needs continued diuresis as edema impairing wound healing/tissue perfusion.  Continue current dose of IV lasix. No indications for hemodialysis but GFR very borderline.  Do not wish to remove HD cath yet (placed 1/13)  Anemia: Hb stable around 10. No ESA's.  Status post aortobifemoral bypass grafting and left renal artery bypass: Wounds with some necrosis. Wound care. Now febrile and vanco/zosyn started 1/19.  HFlu trach asp - from 1/17 cx on zosyn  VDRF: Ventilator management per CCM. Reintubated d/t inability to clear secretions. Per CCM.  Atrial fib Rate controlled. Cardiology  following.  H/O CAD  HTN - stable  Jamal Maes, MD [facility] [phone] Pager 06/12/2014, 7:42 AM

## 2014-06-13 ENCOUNTER — Inpatient Hospital Stay (HOSPITAL_COMMUNITY): Payer: Medicare Other

## 2014-06-13 LAB — CBC
HCT: 30 % — ABNORMAL LOW (ref 39.0–52.0)
Hemoglobin: 9.8 g/dL — ABNORMAL LOW (ref 13.0–17.0)
MCH: 30 pg (ref 26.0–34.0)
MCHC: 32.7 g/dL (ref 30.0–36.0)
MCV: 91.7 fL (ref 78.0–100.0)
Platelets: 322 10*3/uL (ref 150–400)
RBC: 3.27 MIL/uL — ABNORMAL LOW (ref 4.22–5.81)
RDW: 15.5 % (ref 11.5–15.5)
WBC: 13 10*3/uL — ABNORMAL HIGH (ref 4.0–10.5)

## 2014-06-13 LAB — GLUCOSE, CAPILLARY
Glucose-Capillary: 110 mg/dL — ABNORMAL HIGH (ref 70–99)
Glucose-Capillary: 114 mg/dL — ABNORMAL HIGH (ref 70–99)
Glucose-Capillary: 116 mg/dL — ABNORMAL HIGH (ref 70–99)
Glucose-Capillary: 118 mg/dL — ABNORMAL HIGH (ref 70–99)
Glucose-Capillary: 122 mg/dL — ABNORMAL HIGH (ref 70–99)
Glucose-Capillary: 129 mg/dL — ABNORMAL HIGH (ref 70–99)
Glucose-Capillary: 97 mg/dL (ref 70–99)

## 2014-06-13 LAB — RENAL FUNCTION PANEL
Albumin: 1.9 g/dL — ABNORMAL LOW (ref 3.5–5.2)
Anion gap: 13 (ref 5–15)
BUN: 113 mg/dL — ABNORMAL HIGH (ref 6–23)
CO2: 32 mmol/L (ref 19–32)
Calcium: 8.2 mg/dL — ABNORMAL LOW (ref 8.4–10.5)
Chloride: 98 mEq/L (ref 96–112)
Creatinine, Ser: 5.02 mg/dL — ABNORMAL HIGH (ref 0.50–1.35)
GFR calc Af Amer: 12 mL/min — ABNORMAL LOW (ref >90)
GFR calc non Af Amer: 11 mL/min — ABNORMAL LOW (ref >90)
Glucose, Bld: 112 mg/dL — ABNORMAL HIGH (ref 70–99)
Phosphorus: 5.7 mg/dL — ABNORMAL HIGH (ref 2.3–4.6)
Potassium: 3 mmol/L — ABNORMAL LOW (ref 3.5–5.1)
Sodium: 143 mmol/L (ref 135–145)

## 2014-06-13 LAB — ALBUMIN: Albumin: 1.9 g/dL — ABNORMAL LOW (ref 3.5–5.2)

## 2014-06-13 LAB — HEPARIN ANTI-XA: Heparin LMW: 0.4 IU/mL

## 2014-06-13 MED ORDER — AMLODIPINE BESYLATE 5 MG PO TABS
5.0000 mg | ORAL_TABLET | Freq: Every day | ORAL | Status: DC
  Filled 2014-06-13 (×2): qty 1

## 2014-06-13 MED ORDER — VITAL HIGH PROTEIN PO LIQD
1000.0000 mL | ORAL | Status: DC
  Filled 2014-06-13 (×2): qty 1000

## 2014-06-13 MED ORDER — FUROSEMIDE 10 MG/ML IJ SOLN
80.0000 mg | Freq: Three times a day (TID) | INTRAMUSCULAR | Status: DC
  Administered 2014-06-13 – 2014-06-14 (×3): 80 mg via INTRAVENOUS
  Filled 2014-06-13 (×3): qty 8

## 2014-06-13 MED ORDER — PRO-STAT SUGAR FREE PO LIQD
30.0000 mL | Freq: Two times a day (BID) | ORAL | Status: DC
  Administered 2014-06-13 – 2014-06-17 (×9): 30 mL
  Filled 2014-06-13 (×11): qty 30

## 2014-06-13 MED ORDER — ENOXAPARIN SODIUM 120 MG/0.8ML ~~LOC~~ SOLN
120.0000 mg | SUBCUTANEOUS | Status: DC
  Administered 2014-06-14 – 2014-06-17 (×4): 120 mg via SUBCUTANEOUS
  Filled 2014-06-13 (×5): qty 0.8

## 2014-06-13 MED ORDER — POTASSIUM CHLORIDE 20 MEQ/15ML (10%) PO SOLN
40.0000 meq | ORAL | Status: AC
  Administered 2014-06-13 (×2): 40 meq
  Filled 2014-06-13 (×3): qty 30

## 2014-06-13 MED ORDER — VITAL HIGH PROTEIN PO LIQD
1000.0000 mL | ORAL | Status: DC
  Administered 2014-06-13 – 2014-06-14 (×2): 1000 mL
  Administered 2014-06-15: 20:00:00
  Administered 2014-06-15: 1000 mL
  Administered 2014-06-16: 06:00:00
  Administered 2014-06-17: 1000 mL
  Filled 2014-06-13 (×9): qty 1000

## 2014-06-13 NOTE — Progress Notes (Addendum)
   [facility]  Subjective  - intubated alert   Objective 105/53 61 101.4 F (38.6 C) (Axillary) 20 98%  Intake/Output Summary (Last 24 hours) at 06/13/14 0725 Last data filed at 06/13/14 0700  Gross per 24 hour  Intake 1917.3 ml  Output   3730 ml  Net -1812.7 ml    Doppler PT biphasic Left groin dehisced no visible graft wet to dry dressing changed Right groin edges with erythema dry dressing  Abdomin soft positive BS, tender to palpation per patients head nod Heart irregular Lungs intubated  Assessment/Planning: POD # 14 open AAA with left renal bypass  Wet to dry left groin, dry dressing right groin Intubated alert WBC increased to 13, on IV zosyn and Vancomycin DVT Lovenox 100 mg SQ q 24, SCD's On K+ replacement therapy Cr rising 5.02 today, UO 250 last hour Cardiology is requesting restart of anticoagulation A-fib rate 68 currently  COLLINS, EMMA MAUREEN 06/13/2014 7:25 AM --  Left groin open but cleaner Will increase TF to 30, with plan to go to goal later today Vent per CCM overall stable  Ruta Hinds, MD [facility] [place]: [phone] Pager: [phone]  Laboratory Lab Results:  Recent Labs  06/11/14 0350 06/13/14 0500  WBC 12.3* 13.0*  HGB 10.1* 9.8*  HCT 30.4* 30.0*  PLT 306 322   BMET  Recent Labs  06/12/14 1605 06/13/14 0450  NA 145 143  K 2.9* 3.0*  CL 99 98  CO2 31 32  GLUCOSE 110* 112*  BUN 112* 113*  CREATININE 5.00* 5.02*  CALCIUM 8.3* 8.2*    COAG Lab Results  Component Value Date   INR 1.34 06/09/2014   INR 1.21 06/04/2014   INR 1.46 05/31/2014   No results found for: PTT

## 2014-06-13 NOTE — Progress Notes (Signed)
Pharmacy Consult Note  Pharmacy Consult for Vancomycin and Zosyn Indication: wound infection  Allergies  Allergen Reactions  . Niaspan [Niacin Er] Hives    Patient Measurements: Height: 5\' 7"  (170.2 cm) Weight: 211 lb 3.2 oz (95.8 kg) IBW/kg (Calculated) : 66.1  Vital Signs: Temp: 99.1 F (37.3 C) (01/21 0813) Temp Source: Axillary (01/21 0813) BP: 109/56 mmHg (01/21 0740) Pulse Rate: 73 (01/21 0740) Intake/Output from previous day: 01/20 0701 - 01/21 0700 In: 1917.3 [I.V.:602.3; NG/GT:965; IV Piggyback:350] Out: [id number] [Urine:3730] Intake/Output from this shift: Total I/O In: -  Out: 250 [Emesis/NG output:250]  Labs:  Recent Labs  06/11/14 0350  06/12/14 0430 06/12/14 1605 06/13/14 0450 06/13/14 0500  WBC 12.3*  --   --   --   --  13.0*  HGB 10.1*  --   --   --   --  9.8*  PLT 306  --   --   --   --  322  CREATININE 4.91*  < > 4.91* 5.00* 5.02*  --   < > = values in this interval not displayed. Estimated Creatinine Clearance: 15.5 mL/min (by C-G formula based on Cr of 5.02).  Assessment: S/p aortobifemoral bypass with left renal artery bypass. Noted malodorous left groin wound with copious purulent drainage. Temp to 101.4, WBC 13. Abx started 1/19 after fevers. Continues on day #2 of vancomycin and zosyn. Still no indications for HD yet. Patient was not loaded with vancomycin so will continue current dose today and will consider checking random level prior to weekend dose. Will follow if can de-escalate abx based on culture results  1/19 groin abscess - ecoli 1/19 bld x 2 - ngtd 1/17 resp cx - h.flu 1/18 cdiff - negative  Coag/Heme:  Post-op afib - CHADSvasc 4, CBC low but stable Stopped coumadin 1/17 due to wound healing issues and changed to full dose lovenox, checked anti-xa level this afternoon after 5 doses and level came back below therapeutic range at 0.4  (goal 0.5-1.2). No bleeding issues noted, patient appears to be clearing medication well, will  increase daily dose.  Goal of Therapy:  4 hour Anti-xa level 0.5-1.2 Vancomycin trough level 15-20 mcg/ml appropriate Zosyn dose for renal function and infection  Plan:   Continue Vancomycin 1 gram IV q48hrs.  Continue Zosyn 2.25 gm IV q8hrs.  Will follow renal function for any need to modify regimens.  Follow up culture data, progress.  Increase daily lovenox to 120mg    CBC q 72 hours  Recheck lovenox level next week if continues  Erin Hearing PharmD., BCPS Clinical Pharmacist Pager [phone] 06/13/2014 3:04 PM

## 2014-06-13 NOTE — Progress Notes (Addendum)
[facility] ROUNDING NOTE   Subjective:   Remains intubated Trying to tell me something this AM but I can't understand Non-oliguric on lasix Creatinine remains around 5 Edema improving Wounds in groin debrided yesterday  Objective:  BP 105/53 mmHg  Pulse 61  Temp(Src) 101.4 F (38.6 C) (Axillary)  Resp 20  Ht 5\' 7"  (1.702 m)  Wt 95.8 kg (211 lb 3.2 oz)  BMI 33.07 kg/m2  SpO2 98%   I/O last 3 completed shifts: In: 2880.1 [I.V.:1055.1; NG/GT:1175; IV Piggyback:650] Out: [id number] [Urine:4995] Total I/O In: -  Out: 250 [Emesis/NG output:250]  Intubated, awake OTT/NG/Left IJ trialysis catheter (1/13) VS as noted Lungs ant clear though BS coarse S1S2 No S3 Abd incision left red Still with pitting edema to hips and of abd wall, les below the knee - overall improving Foley with clear urine  Weight Trending 1/7 89.6 kg 1/10 105.5 kg 1/12 108.2 kg 1/13 108.9 kg   CRRT started 1/14 106.9 kg 1/15 104.6 kg   CRRT stopped 1/16 102 kg 1/17 98.6 kg 1/19 96.8 kg 1/20 98 kg 1/21 95.8 kg    Recent Labs Lab 06/11/14 0350 06/11/14 1545 06/12/14 0430 06/12/14 1605 06/13/14 0450  NA 143 144 145 145 143  K 2.9* 3.1* 3.0* 2.9* 3.0*  CL 102 104 99 99 98  CO2 28 34* 32 31 32  GLUCOSE 118* 112* 121* 110* 112*  BUN 103* 109* 111* 112* 113*  CREATININE 4.91* 4.95* 4.91* 5.00* 5.02*  CALCIUM 8.4 8.3* 8.4 8.3* 8.2*  MG 1.8  --  1.7  --   --   PHOS 5.4* 5.4* 5.6* 5.7* 5.7*     Recent Labs Lab 06/11/14 0350 06/11/14 1545 06/12/14 0430 06/12/14 1605 06/13/14 0450  ALBUMIN 1.9* 1.8* 1.9* 1.9* 1.9*   CBC:  Recent Labs Lab 06/07/14 0410 06/10/14 0445 06/11/14 0350 06/13/14 0500  WBC 7.4 10.9* 12.3* 13.0*  HGB 10.2* 10.1* 10.1* 9.8*  HCT 30.9* 30.4* 30.4* 30.0*  MCV 90.1 89.7 91.0 91.7  PLT 222 308 306 322    Recent Labs Lab 06/12/14 0754 06/12/14 1206 06/12/14 1627 06/12/14 2350 06/13/14 0417  GLUCAP 101* 98 103* 98 97   No results for  input(s): LABPROT, INR in the last 72 hours. Imaging: Dg Chest Port 1 View  06/12/2014   CLINICAL DATA:  Ventilator dependent respiratory failure  EXAM: PORTABLE CHEST - 1 VIEW  COMPARISON:  06/11/2014  FINDINGS: Endotracheal tube, left IJ dialysis catheter, right IJ central venous catheter, and nasogastric tube are stable in position. Previous CABG. Heart size upper limits normal for technique. Mild central vascular congestion persists. No definite effusion. Atheromatous aorta. Patchy atelectasis or infiltrate in the right mid lung has developed since previous exam.  IMPRESSION: 1. New right mid lung atelectasis or infiltrate. 2.  Support hardware stable in position.   Electronically Signed   By: Arne Cleveland M.D.   On: 06/12/2014 08:08     Medications:   Infusions . sodium chloride 10 mL/hr at 06/13/14 0700  . fentaNYL infusion INTRAVENOUS 40 mcg/hr (06/13/14 0747)  . propofol 20 mcg/kg/min (06/13/14 0700)   Scheduled Medications . amLODipine  10 mg Per Tube QAC breakfast  . antiseptic oral rinse  7 mL Mouth Rinse q12n4p  . carvedilol  6.25 mg Per Tube BID  . chlorhexidine  15 mL Mouth Rinse BID  . enoxaparin (LOVENOX) injection  1 mg/kg Subcutaneous Q24H  . feeding supplement (VITAL HIGH PROTEIN)  1,000 mL Per Tube Q24H  .  free water  300 mL Per Tube Q6H  . furosemide  80 mg Intravenous Q6H  . insulin aspart  0-9 Units Subcutaneous 6 times per day  . pantoprazole sodium  40 mg Per Tube Q24H  . piperacillin-tazobactam (ZOSYN)  IV  2.25 g Intravenous 3 times per day  . vancomycin  1,000 mg Intravenous Q48H   acetaminophen (TYLENOL) oral liquid 160 mg/5 mL, albuterol, fentaNYL, hydrALAZINE, LORazepam, ondansetron, sodium chloride  Assessment/ Plan:   69 yo male with background of CAD s/p CABG, history of R CEA/bilat LE stenting, HLD, tobacco use, CKD III (baseline creatinine 1.8-2), OSA on CPAP, DM, HTN, mild COPD who developed ischemic ATN following aortobifemoral bypass graft and  left renal artery bypass on 05/30/14 with pressor dependent hypotension/ABLA contributing. Required CRRT 06/05/14-06/07/14. Monitoring for recovery of function. Course otherwise complicated by VDRF (required reintubation 1/17), infected groin wounds and HFlu from trach aspirate   Acute renal failure on baseline chronic kidney disease stage III: s/p CRRT 1/13-1/15.  UOP is excellent with lasix. Weight slowly dropping. Needs continued diuresis as edema impairing wound healing/tissue perfusion - edema clearly better.  Decrease lasix to Q8hr.  . No indications for additional hemodialysis but GFR very borderline and may need additional.  Do not wish to remove HD cath yet (placed 1/13). Continue free water via tube and K repletion  Anemia: Hb stable around 10 (just under)  Status post aortobifemoral bypass grafting and left renal artery bypass: Wounds debrided yesterday. Looking cleaner.  Vanco and zosyn started 1/19. Will need close f/u of vanco levels with very impaired GFR.  HFlu trach asp - from 1/17 cx on zosyn  VDRF: Ventilator management per CCM. Reintubated d/t inability to clear secretions. Per CCM.  Atrial fib Rate controlled. Cardiology  following.  H/O CAD  HTN - BP's soft. Reduce amlodipine 10->5  Nutrition - ? Why not being fed?  Per primary team.   Jamal Maes, MD [facility] [phone] Pager 06/13/2014, 7:55 AM

## 2014-06-13 NOTE — Plan of Care (Signed)
Problem: Phase II Progression Outcomes Goal: Tolerates clear liquids without nausea/vomiting Outcome: Not Progressing Pt continues on tubefeeds Goal: Progress activity as tolerated unless otherwise ordered Outcome: Not Progressing Pt remains on bedrest

## 2014-06-13 NOTE — Progress Notes (Signed)
NUTRITION FOLLOW UP  Intervention:    Continue TF Vital High Protein at 30 mL/hr and increase by 10 ml every 4 hours to goal rate of 60 ml/h with Prostat BID.  New goal rate with calories from Propofol to provide 1952 kcal (98% of estimated needs), 156 g protein (100% of estimated needs), and 1210 mL of free water.   RD will continue to monitor.   Nutrition Dx:   Inadequate oral intake related to inability to eat as evidenced by NPO status, ongoing.  Goal:   Pt to meet >/= 90% of their estimated nutrition needs; not met  Monitor:   TF tolerance/adequacy, weight trend, labs, vent status.  Assessment:   69 yo male active smoker with extensive PMH including hx CAD s/p CABG, CKD III, OSA on CPAP, DM, HTN, COPD, stable L apex 64m nodule (followed by wert) presented 1/7 for elective repair of 5.6cm AAA. Remains intubated post op.   Pt was on trickle feeds. Consult to increase TF to goal. Not ready for extubation. Pt still in positive fluid balance. Needs calculated based on dry weight from 1/07.   Will increase TF to new goal of Vital HP @ 60 ml/hr with Prostat BID to provide, with calories from Propofol, 1952 kcal, 156 g protein and 1210 mL of water.   Patient remains intubated on ventilator support MV: 10.1 L/min Temp (24hrs), Avg:99.3 F (37.4 C), Min:97 F (36.1 C), Max:101.4 F (38.6 C)  Propofol: 11.8 ml/hr to provide additional 312 calories  Height: Ht Readings from Last 1 Encounters:  05/30/14 _0  (1.702 m)    Weight Status:   Wt Readings from Last 1 Encounters:  06/13/14 211 lb 3.2 oz (95.8 kg)   05/30/14 197 lb 9 oz (89.614 kg)   Re-estimated needs:  Kcal: 1995 Protein: 135-155 gm Fluid: >/= 2 L  Skin: closed abdominal incision, closed right and left groin incisions  Diet Order: Diet NPO time specified   Intake/Output Summary (Last 24 hours) at 06/13/14 1116 Last data filed at 06/13/14 1048  Gross per 24 hour  Intake 1876.4 ml  Output   3630 ml   Net -1753.6 ml    Last BM: 1/21   Labs:   Recent Labs Lab 06/11/14 0350  06/12/14 0430 06/12/14 1605 06/13/14 0450  NA 143  < > 145 145 143  K 2.9*  < > 3.0* 2.9* 3.0*  CL 102  < > 99 99 98  CO2 28  < > 32 31 32  BUN 103*  < > 111* 112* 113*  CREATININE 4.91*  < > 4.91* 5.00* 5.02*  CALCIUM 8.4  < > 8.4 8.3* 8.2*  MG 1.8  --  1.7  --   --   PHOS 5.4*  < > 5.6* 5.7* 5.7*  GLUCOSE 118*  < > 121* 110* 112*  < > = values in this interval not displayed.  CBG (last 3)   Recent Labs  06/12/14 2350 06/13/14 0417 06/13/14 0810  GLUCAP 98 97 118*    Scheduled Meds: . [START ON 06/14/2014] amLODipine  5 mg Per Tube QAC breakfast  . antiseptic oral rinse  7 mL Mouth Rinse q12n4p  . carvedilol  6.25 mg Per Tube BID  . chlorhexidine  15 mL Mouth Rinse BID  . enoxaparin (LOVENOX) injection  1 mg/kg Subcutaneous Q24H  . feeding supplement (VITAL HIGH PROTEIN)  1,000 mL Per Tube Q24H  . free water  300 mL Per Tube Q6H  . furosemide  80 mg Intravenous 3 times per day  . insulin aspart  0-9 Units Subcutaneous 6 times per day  . pantoprazole sodium  40 mg Per Tube Q24H  . piperacillin-tazobactam (ZOSYN)  IV  2.25 g Intravenous 3 times per day  . potassium chloride  40 mEq Per Tube Q4H  . vancomycin  1,000 mg Intravenous Q48H    Continuous Infusions: . sodium chloride 10 mL/hr at 06/13/14 0800  . fentaNYL infusion INTRAVENOUS 40 mcg/hr (06/13/14 0747)  . propofol 20 mcg/kg/min (06/13/14 1000)    Laurette Schimke MS, RD, LDN

## 2014-06-13 NOTE — Progress Notes (Signed)
UR Completed.  [phone]  

## 2014-06-13 NOTE — Progress Notes (Signed)
PULMONARY / CRITICAL CARE MEDICINE   Name: Barry Lawrence MRN: [id number] DOB: 07-Apr-1946    ADMISSION DATE:  05/30/2014 CONSULTATION DATE:  05/30/2014  REFERRING MD :  [name]   CHIEF COMPLAINT:  Vent management   INITIAL PRESENTATION:  69 yo male smoker for repair of AAA.  Remained on vent post-op.   PMHx of CAD s/p CABG, CKD III, OSA on CPAP, DM, HTN, mild COPD.  STUDIES:  None  SIGNIFICANT EVENTS: 1/07 OR>> elective repair AAA 1/10 transfuse 2 units PRBC, renal consulted 1/17 intubated urgently 1/18 bradycardia from precedex >> change to diprivan 1/19 Fever >> add Abx  SUBJECTIVE:  Tolerating pressure support, but RR still high.  VITAL SIGNS: Temp:  [97 F (36.1 C)-101.4 F (38.6 C)] 99.1 F (37.3 C) (01/21 0813) Pulse Rate:  [61-85] 73 (01/21 0740) Resp:  [12-27] 25 (01/21 0740) BP: (94-143)/(46-74) 109/56 mmHg (01/21 0740) SpO2:  [94 %-100 %] 97 % (01/21 0740) FiO2 (%):  [40 %] 40 % (01/21 0754) Weight:  [211 lb 3.2 oz (95.8 kg)] 211 lb 3.2 oz (95.8 kg) (01/21 0500)   HEMODYNAMICS: CVP:  [3 mmHg-13 mmHg] 3 mmHg   VENTILATOR SETTINGS: Vent Mode:  [-] PSV;CPAP FiO2 (%):  [40 %] 40 % Set Rate:  [18 bmp] 18 bmp Vt Set:  [530 mL] 530 mL PEEP:  [5 cmH20] 5 cmH20 Pressure Support:  [5 [id number] cmH20] 5 cmH20 Plateau Pressure:  [20 cmH20-22 cmH20] 20 cmH20   INTAKE / OUTPUT:  Intake/Output Summary (Last 24 hours) at 06/13/14 0839 Last data filed at 06/13/14 0752  Gross per 24 hour  Intake 1837.4 ml  Output   3630 ml  Net -1792.6 ml   PHYSICAL EXAMINATION: General: no distress Neuro: RASS 0 HEENT: ETT in place Cardiovascular: irregular Lungs: scattered rhonchi Abdomen: soft, non tender Musculoskeletal: 1+ edema Skin: drainage from Lt groin site GU: scrotal edema  LABS:  CBC  Recent Labs Lab 06/10/14 0445 06/11/14 0350 06/13/14 0500  WBC 10.9* 12.3* 13.0*  HGB 10.1* 10.1* 9.8*  HCT 30.4* 30.4* 30.0*  PLT 308 306 322    Coag's  Recent  Labs Lab 06/09/14 0353  INR 1.34   BMET  Recent Labs Lab 06/12/14 0430 06/12/14 1605 06/13/14 0450  NA 145 145 143  K 3.0* 2.9* 3.0*  CL 99 99 98  CO2 32 31 32  BUN 111* 112* 113*  CREATININE 4.91* 5.00* 5.02*  GLUCOSE 121* 110* 112*   Electrolytes  Recent Labs Lab 06/11/14 0350  06/12/14 0430 06/12/14 1605 06/13/14 0450  CALCIUM 8.4  < > 8.4 8.3* 8.2*  MG 1.8  --  1.7  --   --   PHOS 5.4*  < > 5.6* 5.7* 5.7*  < > = values in this interval not displayed.   ABG  Recent Labs Lab 06/09/14 0400 06/09/14 0601 06/10/14 0403  PHART 7.275* 7.311* 7.387  PCO2ART 60.2* 55.1* 44.9  PO2ART 79.0* 157.0* 86.0   Liver Enzymes  Recent Labs Lab 06/12/14 0430 06/12/14 1605 06/13/14 0450  ALBUMIN 1.9* 1.9* 1.9*   Glucose  Recent Labs Lab 06/12/14 0754 06/12/14 1206 06/12/14 1627 06/12/14 2350 06/13/14 0417 06/13/14 0810  GLUCAP 101* 98 103* 98 97 118*   Imaging Dg Chest Port 1 View  06/12/2014   CLINICAL DATA:  Ventilator dependent respiratory failure  EXAM: PORTABLE CHEST - 1 VIEW  COMPARISON:  06/11/2014  FINDINGS: Endotracheal tube, left IJ dialysis catheter, right IJ central venous catheter, and nasogastric tube are stable  in position. Previous CABG. Heart size upper limits normal for technique. Mild central vascular congestion persists. No definite effusion. Atheromatous aorta. Patchy atelectasis or infiltrate in the right mid lung has developed since previous exam.  IMPRESSION: 1. New right mid lung atelectasis or infiltrate. 2.  Support hardware stable in position.   Electronically Signed   By: Arne Cleveland M.D.   On: 06/12/2014 08:08    ASSESSMENT / PLAN:  PULMONARY OETT 01/07 >>>1/16>>re intubated 1/17>> A: Acute respiratory failure with hypoxia following surgery for AAA repair. Hx of OSA, COPD. Re-intubated 1/17 due to airway protection and WOB. P:   Pressure support wean >> not ready for extubation yet, but improving >> will need to clarify  with family about goals of care prior to repeat extubation attempt F/u CXR Goal negative fluid balance PRN BD's  CARDIOVASCULAR R Swan 01/07 >> 1/09 Rt IJ CVL through cordis 1/09 >> Lt IJ HD cath 1/14 >> A:  Hypovolemic/hemorrhagic shock after AAA repair >> resolved. A fib. Hx of CAD s/p CABG, HTN, HLD, PAD. P:  Coreg, lasix, norvasc Full dose lovenox >> coumadin if okay with surgery  RENAL A:   AKI in setting of hypovolemic/hemorrhagic shock >> Urine Na 19, Urine creatinine 160 from 1/09. Non anion gap metabolic acidosis >> improved. CKD III >> baseline creatinine 1.8 to 2.1. Hypokalemia. P:   Monitor renal fx, urine outpt Replace electrolytes as needed  No indication for HD at present  GASTROINTESTINAL A:   Nutrition. Hx of GERD. P:   TF's running Protonix for SUP  HEMATOLOGIC A:  Acute blood loss anemia. Thrombocytopenia >> resolved. P: F/u CBC Transfuse for Hb < 7  INFECTIOUS A:   Fever >> possible sources HCAP and Lt groin wound. P:   Day 2 vancomycin, zosyn Wound care  Blood 1/19 >> Wound 1/19 >> E coli  ENDOCRINE A:   No acute issues. P:   SSI while on tube feeds  NEUROLOGIC A:   Acute metabolic encephalopathy. P:   Diprivan, fentanyl RASS goal: 0   Summary: Day 15 on vent.  Making some progress with vent weaning after change of Abx >> not ready for extubation yet.  Will need to clarify with family whether they would want re-intubation/trach if he fails extubation attempt again.  CC time 35 minutes.  Chesley Mires, MD [facility] Pulmonary/Critical Care 06/13/2014, 8:39 AM Pager:  [phone] After 3pm call: [phone]

## 2014-06-13 NOTE — Progress Notes (Signed)
Physical Therapy Discharge Patient Details Name: Barry Lawrence MRN: [id number] DOB: 1945-11-16 Today's Date: 06/13/2014 Time:  -     Patient discharged from PT services secondary to patient has not been medically ready 3 (three) consecutive times. He has also recently been placed on bed rest orders. Please re-consult when patient is medically ready for PT services: Patient unable to participate in discharge planning and no caregivers available  GP     [name] 06/13/2014, 8:27 AM  Elayne Snare, [place]

## 2014-06-14 ENCOUNTER — Inpatient Hospital Stay (HOSPITAL_COMMUNITY): Payer: Medicare Other

## 2014-06-14 DIAGNOSIS — J189 Pneumonia, unspecified organism: Secondary | ICD-10-CM | POA: Insufficient documentation

## 2014-06-14 DIAGNOSIS — J9602 Acute respiratory failure with hypercapnia: Secondary | ICD-10-CM

## 2014-06-14 DIAGNOSIS — Z452 Encounter for adjustment and management of vascular access device: Secondary | ICD-10-CM

## 2014-06-14 DIAGNOSIS — I482 Chronic atrial fibrillation: Secondary | ICD-10-CM

## 2014-06-14 LAB — RENAL FUNCTION PANEL
Albumin: 1.9 g/dL — ABNORMAL LOW (ref 3.5–5.2)
Anion gap: 14 (ref 5–15)
BUN: 122 mg/dL — ABNORMAL HIGH (ref 6–23)
CO2: 34 mmol/L — ABNORMAL HIGH (ref 19–32)
Calcium: 8.2 mg/dL — ABNORMAL LOW (ref 8.4–10.5)
Chloride: 102 mEq/L (ref 96–112)
Creatinine, Ser: 5.18 mg/dL — ABNORMAL HIGH (ref 0.50–1.35)
GFR calc Af Amer: 12 mL/min — ABNORMAL LOW (ref >90)
GFR calc non Af Amer: 10 mL/min — ABNORMAL LOW (ref >90)
Glucose, Bld: 121 mg/dL — ABNORMAL HIGH (ref 70–99)
Phosphorus: 5.8 mg/dL — ABNORMAL HIGH (ref 2.3–4.6)
Potassium: 3.3 mmol/L — ABNORMAL LOW (ref 3.5–5.1)
Sodium: 150 mmol/L — ABNORMAL HIGH (ref 135–145)

## 2014-06-14 LAB — GLUCOSE, CAPILLARY
Glucose-Capillary: 114 mg/dL — ABNORMAL HIGH (ref 70–99)
Glucose-Capillary: 129 mg/dL — ABNORMAL HIGH (ref 70–99)
Glucose-Capillary: 143 mg/dL — ABNORMAL HIGH (ref 70–99)
Glucose-Capillary: 82 mg/dL (ref 70–99)
Glucose-Capillary: 123 mg/dL — ABNORMAL HIGH (ref 70–99)

## 2014-06-14 LAB — CBC
HCT: 29 % — ABNORMAL LOW (ref 39.0–52.0)
Hemoglobin: 9.3 g/dL — ABNORMAL LOW (ref 13.0–17.0)
MCH: 29.3 pg (ref 26.0–34.0)
MCHC: 32.1 g/dL (ref 30.0–36.0)
MCV: 91.5 fL (ref 78.0–100.0)
Platelets: 324 10*3/uL (ref 150–400)
RBC: 3.17 MIL/uL — ABNORMAL LOW (ref 4.22–5.81)
RDW: 15.5 % (ref 11.5–15.5)
WBC: 10.9 10*3/uL — ABNORMAL HIGH (ref 4.0–10.5)

## 2014-06-14 LAB — PREALBUMIN: Prealbumin: 10.2 mg/dL — ABNORMAL LOW (ref 17.0–34.0)

## 2014-06-14 LAB — CULTURE, ROUTINE-ABSCESS

## 2014-06-14 MED ORDER — FREE WATER
300.0000 mL | Status: DC
  Administered 2014-06-14 – 2014-06-18 (×24): 300 mL

## 2014-06-14 MED ORDER — CEFTRIAXONE SODIUM IN DEXTROSE 20 MG/ML IV SOLN
1.0000 g | INTRAVENOUS | Status: DC
  Administered 2014-06-14 – 2014-06-25 (×12): 1 g via INTRAVENOUS
  Filled 2014-06-14 (×13): qty 50

## 2014-06-14 MED ORDER — FUROSEMIDE 10 MG/ML IJ SOLN
80.0000 mg | Freq: Two times a day (BID) | INTRAMUSCULAR | Status: DC
  Administered 2014-06-14 – 2014-06-15 (×2): 80 mg via INTRAVENOUS
  Filled 2014-06-14 (×2): qty 8

## 2014-06-14 NOTE — Progress Notes (Signed)
[facility] ROUNDING NOTE   Subjective:   Remains intubated Non-oliguric on lasix Creatinine remains around 5 Edema improving For right groin wound debridement later today  Objective:  BP 132/70 mmHg  Pulse 80  Temp(Src) 100.4 F (38 C) (Oral)  Resp 19  Ht 5\' 7"  (1.702 m)  Wt 92.2 kg (203 lb 4.2 oz)  BMI 31.83 kg/m2  SpO2 100%   I/O last 3 completed shifts: In: 4190.6 [I.V.:995.6; NG/GT:2545; IV Piggyback:650] Out: 5410 [Urine:5410]   Intubated, awake OTT/NG/Left IJ trialysis catheter (1/13) VS as noted Lungs ant clear though BS coarse S1S2 No S3 Abd incision less ruborous Pitting edema thighs, not so much below knee or abd wall now Foley with clear urine  Weight Trending 1/7 89.6 kg 1/10 105.5 kg 1/12 108.2 kg 1/13 108.9 kg   CRRT started 1/14 106.9 kg 1/15 104.6 kg   CRRT stopped 1/16 102 kg 1/17 98.6 kg 1/19 96.8 kg 1/20 98 kg 1/21 95.8 kg  1/22 92.2 kg   Recent Labs Lab 06/11/14 0350 06/11/14 1545 06/12/14 0430 06/12/14 1605 06/13/14 0450 06/14/14 0500  NA 143 144 145 145 143 150*  K 2.9* 3.1* 3.0* 2.9* 3.0* 3.3*  CL 102 104 99 99 98 102  CO2 28 34* 32 31 32 34*  GLUCOSE 118* 112* 121* 110* 112* 121*  BUN 103* 109* 111* 112* 113* 122*  CREATININE 4.91* 4.95* 4.91* 5.00* 5.02* 5.18*  CALCIUM 8.4 8.3* 8.4 8.3* 8.2* 8.2*  MG 1.8  --  1.7  --   --   --   PHOS 5.4* 5.4* 5.6* 5.7* 5.7* 5.8*     Recent Labs Lab 06/12/14 0430 06/12/14 1605 06/13/14 0450 06/13/14 1203 06/14/14 0500  ALBUMIN 1.9* 1.9* 1.9* 1.9* 1.9*   CBC:  Recent Labs Lab 06/10/14 0445 06/11/14 0350 06/13/14 0500 06/14/14 0500  WBC 10.9* 12.3* 13.0* 10.9*  HGB 10.1* 10.1* 9.8* 9.3*  HCT 30.4* 30.4* 30.0* 29.0*  MCV 89.7 91.0 91.7 91.5  PLT 308 306 322 324    Recent Labs Lab 06/13/14 1236 06/13/14 1551 06/13/14 1920 06/13/14 2341 06/14/14 0354  GLUCAP 116* 122* 114* 129* 114*    Imaging: Dg Chest Port 1 View  06/13/2014   CLINICAL DATA:   Ventilator dependent respiratory failure  EXAM: PORTABLE CHEST - 1 VIEW  COMPARISON:  Portable exam 0644 hr compared to 06/12/2014  FINDINGS: Tip of endotracheal tube projects 4.7 cm above carina.  Tips of BILATERAL jugular catheters project over SVC.  Nasogastric tube extends into stomach.  Normal heart size post median sternotomy and CABG.  Atherosclerotic calcification aorta.  Pulmonary vascular congestion with scattered interstitial prominence bilaterally question mild pulmonary edema.  No pleural effusion or pneumothorax.  Bones demineralized.  IMPRESSION: Stable line and tube positions.  Probable mild pulmonary edema.   Electronically Signed   By: Lavonia Dana M.D.   On: 06/13/2014 07:56     Medications:   Infusions . sodium chloride 10 mL/hr at 06/14/14 0600  . fentaNYL infusion INTRAVENOUS 40 mcg/hr (06/14/14 0600)  . propofol 20 mcg/kg/min (06/14/14 0600)   Scheduled Medications . amLODipine  5 mg Per Tube QAC breakfast  . antiseptic oral rinse  7 mL Mouth Rinse q12n4p  . carvedilol  6.25 mg Per Tube BID  . chlorhexidine  15 mL Mouth Rinse BID  . enoxaparin (LOVENOX) injection  120 mg Subcutaneous Q24H  . feeding supplement (PRO-STAT SUGAR FREE 64)  30 mL Per Tube BID BM  . feeding supplement (  VITAL HIGH PROTEIN)  1,000 mL Per Tube Q24H  . free water  300 mL Per Tube 6 times per day  . furosemide  80 mg Intravenous Every 12 hours (non-specified)  . insulin aspart  0-9 Units Subcutaneous 6 times per day  . pantoprazole sodium  40 mg Per Tube Q24H  . piperacillin-tazobactam (ZOSYN)  IV  2.25 g Intravenous 3 times per day  . vancomycin  1,000 mg Intravenous Q48H   acetaminophen (TYLENOL) oral liquid 160 mg/5 mL, albuterol, fentaNYL, hydrALAZINE, LORazepam, ondansetron  Assessment/ Plan:   69 yo male with background of CAD s/p CABG, history of R CEA/bilat LE stenting, HLD, tobacco use, CKD III (baseline creatinine 1.8-2), OSA on CPAP, DM, HTN, mild COPD who developed ischemic ATN  following aortobifemoral bypass graft and left renal artery bypass on 05/30/14 with pressor dependent hypotension/ABLA contributing. Required CRRT 06/05/14-06/07/14. Monitoring for recovery of function. Course otherwise complicated by VDRF (required reintubation 1/17), infected groin wounds and HFlu from trach aspirate   Acute renal failure on baseline chronic kidney disease stage III: s/p CRRT 1/13-1/15.  UOP is excellent with lasix. Weight slowly dropping. Slow down on diuresis now.  Decrease lasix to Q12 hours Needs more free water - may need D5W in addition to free water via tube but have increased that to 300 ml Q4H    No indications for additional hemodialysis but GFR very borderline and may need additional.  Do not wish to remove HD cath yet (placed 1/13).   Anemia: Hb stable around 10 (just under) TSat low 1/9  Status post aortobifemoral bypass grafting and left renal artery bypass: Wounds debrided yesterday. Looking cleaner.  Vanco and zosyn started 1/19. Will need close f/u of vanco levels with very impaired GFR.  HFlu trach asp/EColi groin wound - present ATB's vanco and zosyn. Has had no vanco levels - check  VDRF: Ventilator management per CCM. Reintubated d/t inability to clear secretions. Per CCM.  Atrial fib Rate controlled. Cardiology  following.  H/O CAD  HTN - BP's remain soft. Stop amlodipine  Nutrition - per primary team  Jamal Maes, MD [place] [phone] Pager 06/14/2014, 7:59 AM

## 2014-06-14 NOTE — Procedures (Addendum)
Central Venous Catheter Insertion Procedure Note CAYDEN CORBIT [id number] 11-30-1945  Procedure: Insertion of Central Venous Catheter Indications: Assessment of intravascular volume and Drug and/or fluid administration  Procedure Details Consent: Risks of procedure as well as the alternatives and risks of each were explained to the (patient/caregiver).  Consent for procedure obtained. Time Out: Verified patient identification, verified procedure, site/side was marked, verified correct patient position, special equipment/implants available, medications/allergies/relevent history reviewed, required imaging and test results available.  Performed  Maximum sterile technique was used including antiseptics, cap, gloves, gown, hand hygiene, mask and sheet. Skin prep: Chlorhexidine; local anesthetic administered A antimicrobial bonded/coated triple lumen catheter was placed in the right subclavian vein using the Seldinger technique.   Evaluation Blood flow good Complications: No apparent complications Patient did tolerate procedure well. Chest X-ray ordered to verify placement.  CXR: pending.  Houston Surges 06/14/2014, 4:52 PM

## 2014-06-14 NOTE — Progress Notes (Signed)
PULMONARY / CRITICAL CARE MEDICINE   Name: Barry Lawrence MRN: [id number] DOB: July 18, 1945   LOS 15 days   ADMISSION DATE:  05/30/2014 CONSULTATION DATE:  05/30/2014  REFERRING MD :  [name]   CHIEF COMPLAINT:  Vent management   INITIAL PRESENTATION:  69 yo male smoker for repair of AAA.  Remained on vent post-op.  PMHx of CAD s/p CABG, CKD III, OSA on CPAP, DM, HTN, mild COPD.    SIGNIFICANT EVENTS: 1/07 OR>> elective repair AAA 1/10 transfuse 2 units PRBC, renal consulted 06/05/14 - 06/07/14 - s/p CRRT 06/08/14 - EXTUBATED 1/17 re- intubated urgently 1/18 bradycardia from precedex >> change to diprivan 1/19 Fever >> add Abx 06/13/14: Tolerating pressure support, but RR still high.    SUBJECTIVE/OVERNIGHT/INTERVAL HX 06/14/2014: Doing PSV but very deconditioned. Making urine - creat 5s - renal reducing diuresis. Has new rt groin wound dehiscence/infection - VVS investigating   VITAL SIGNS: Temp:  [98.9 F (37.2 C)-100.4 F (38 C)] 100.4 F (38 C) (01/22 0800) Pulse Rate:  [65-92] 92 (01/22 0830) Resp:  [14-29] 23 (01/22 0800) BP: (88-132)/(42-76) 126/58 mmHg (01/22 0800) SpO2:  [93 %-100 %] 96 % (01/22 0830) FiO2 (%):  [40 %] 40 % (01/22 0730) Weight:  [92.2 kg (203 lb 4.2 oz)] 92.2 kg (203 lb 4.2 oz) (01/22 0500)   HEMODYNAMICS: CVP:  [6 mmHg-11 mmHg] 9 mmHg   VENTILATOR SETTINGS: Vent Mode:  [-] PSV FiO2 (%):  [40 %] 40 % Set Rate:  [18 bmp] 18 bmp Vt Set:  [530 mL] 530 mL PEEP:  [5 cmH20] 5 cmH20 Pressure Support:  [5 [id number] cmH20] 5 cmH20 Plateau Pressure:  [16 cmH20-20 cmH20] 18 cmH20   INTAKE / OUTPUT:  Intake/Output Summary (Last 24 hours) at 06/14/14 [id number] Last data filed at 06/14/14 0600  Gross per 24 hour  Intake 2996.8 ml  Output   3210 ml  Net -213.2 ml   PHYSICAL EXAMINATION: General: deconditioned male Neuro: RASS +2. Mild agitated HEENT: ETT in place Cardiovascular: irregular Lungs: scattered rhonchi. On Vent PSV Abdomen: soft, non  tender Musculoskeletal: 1+ edema Skin: drainage from Lt groin site GU: scrotal edema - improved. Left groind wound - ulcer + Rt groin incision  - early stage infection +  LABS:  PULMONARY  Recent Labs Lab 06/08/14 0139 06/08/14 1545 06/09/14 0400 06/09/14 0601 06/10/14 0403  PHART 7.324* 7.335* 7.275* 7.311* 7.387  PCO2ART 45.4* 47.2* 60.2* 55.1* 44.9  PO2ART 68.0* 67.0* 79.0* 157.0* 86.0  HCO3 23.7 25.1* 27.9* 27.8* 27.0*  TCO2 25 27 30 29 28   O2SAT 92.0 91.0 93.0 99.0 96.0    CBC  Recent Labs Lab 06/11/14 0350 06/13/14 0500 06/14/14 0500  HGB 10.1* 9.8* 9.3*  HCT 30.4* 30.0* 29.0*  WBC 12.3* 13.0* 10.9*  PLT 306 322 324    COAGULATION  Recent Labs Lab 06/09/14 0353  INR 1.34    CARDIAC  No results for input(s): TROPONINI in the last 168 hours. No results for input(s): PROBNP in the last 168 hours.   CHEMISTRY  Recent Labs Lab 06/11/14 0350 06/11/14 1545 06/12/14 0430 06/12/14 1605 06/13/14 0450 06/14/14 0500  NA 143 144 145 145 143 150*  K 2.9* 3.1* 3.0* 2.9* 3.0* 3.3*  CL 102 104 99 99 98 102  CO2 28 34* 32 31 32 34*  GLUCOSE 118* 112* 121* 110* 112* 121*  BUN 103* 109* 111* 112* 113* 122*  CREATININE 4.91* 4.95* 4.91* 5.00* 5.02* 5.18*  CALCIUM 8.4 8.3* 8.4 8.3*  8.2* 8.2*  MG 1.8  --  1.7  --   --   --   PHOS 5.4* 5.4* 5.6* 5.7* 5.7* 5.8*   Estimated Creatinine Clearance: 14.8 mL/min (by C-G formula based on Cr of 5.18).   LIVER  Recent Labs Lab 06/09/14 0353  06/12/14 0430 06/12/14 1605 06/13/14 0450 06/13/14 1203 06/14/14 0500  ALBUMIN 2.1*  < > 1.9* 1.9* 1.9* 1.9* 1.9*  INR 1.34  --   --   --   --   --   --   < > = values in this interval not displayed.   INFECTIOUS No results for input(s): LATICACIDVEN, PROCALCITON in the last 168 hours.   ENDOCRINE CBG (last 3)   Recent Labs  06/13/14 1920 06/13/14 2341 06/14/14 0354  GLUCAP 114* 129* 114*         IMAGING x48h Dg Chest Port 1 View  06/14/2014    CLINICAL DATA:  Hypoxia  EXAM: PORTABLE CHEST - 1 VIEW  COMPARISON:  June 13, 2014  FINDINGS: Endotracheal tube tip is 4.5 cm above the carina. Nasogastric tube tip and side port are below the diaphragm. Central catheter tips are in the superior vena cava, stable. No pneumothorax. Mild generalized interstitial edema remains. There is no airspace consolidation. No new opacity. Heart is borderline enlarged with pulmonary vascularity within normal limits. No adenopathy.  IMPRESSION: Tube and catheter positions as described without pneumothorax. Heart slightly prominent with mild edema. Question a degree of underlying congestive heart failure. No airspace consolidation.   Electronically Signed   By: Lowella Grip M.D.   On: 06/14/2014 07:58   Dg Chest Port 1 View  06/13/2014   CLINICAL DATA:  Ventilator dependent respiratory failure  EXAM: PORTABLE CHEST - 1 VIEW  COMPARISON:  Portable exam 0644 hr compared to 06/12/2014  FINDINGS: Tip of endotracheal tube projects 4.7 cm above carina.  Tips of BILATERAL jugular catheters project over SVC.  Nasogastric tube extends into stomach.  Normal heart size post median sternotomy and CABG.  Atherosclerotic calcification aorta.  Pulmonary vascular congestion with scattered interstitial prominence bilaterally question mild pulmonary edema.  No pleural effusion or pneumothorax.  Bones demineralized.  IMPRESSION: Stable line and tube positions.  Probable mild pulmonary edema.   Electronically Signed   By: Lavonia Dana M.D.   On: 06/13/2014 07:56       ASSESSMENT / PLAN:  PULMONARY OETT 01/07 >>>1/16>>re intubated 1/17>> A: Hx of OSA, COPD. Acute respiratory failure with hypoxia following surgery for AAA repair. Re-intubated 1/17 due to airway protection and WOB.  06/14/2014: Meets indication for trach due to prolonged mechanical ventilation even though doing PSV he is too deconditioned to liberate off mech vent   P:   Recommend trach  (per RN - patient  against trach but wife likely amenable) - his prognosis for recovery seems decent   CARDIOVASCULAR R Swan 01/07 >> 1/09 Rt IJ CVL through cordis 1/09 >> Lt IJ HD cath 1/14 >> A:  Hypovolemic/hemorrhagic shock after AAA repair >> resolved. A fib. Hx of CAD s/p CABG, HTN, HLD, PAD.   - not on pressors P:  Coreg, lasix, norvasc Full dose lovenox >> coumadin if okay with surgery  RENAL A:   CKD III >> baseline creatinine 1.8 to 2.1.  Post op  ATN  in setting of hypovolemic/hemorrhagic shock > s/p CRRT ending 06/08/14    -06/14/2014 :    making urine. Creat 5s, Renal reduced lasix  P:   Per renal  GASTROINTESTINAL A:   Nutrition. Hx of GERD. P:   TF's running Protonix for SUP  HEMATOLOGIC A:  Acute blood loss anemia. Thrombocytopenia >> resolved. P: F/u CBC Transfuse for Hb < 7  INFECTIOUS A:   Fever >> possible sources HCAP and Lt groin wound. P:   Day 3 vancomycin, zosyn Wound care  Blood 1/19 >> Wound 1/19 >> E coli  ENDOCRINE A:   No acute issues. P:   SSI while on tube feeds  NEUROLOGIC A:   Acute metabolic encephalopathy. P:   Diprivan, fentanyl (check ck and lactitc 06/15/14)  RASS goal: 0   Summary: Day 15 on vent.  He will benefit from trach/LTAC. D.w Dr Oneida Alar     The patient is critically ill with multiple organ systems failure and requires high complexity decision making for assessment and support, frequent evaluation and titration of therapies, application of advanced monitoring technologies and extensive interpretation of multiple databases.   Critical Care Time devoted to patient care services described in this note is  30  Minutes. This time reflects time of care of this signee Dr Brand Males. This critical care time does not reflect procedure time, or teaching time or supervisory time of PA/NP/Med student/Med Resident etc but could involve care discussion time    Dr. Brand Males, M.D., [facility].C.P Pulmonary and Critical  Care Medicine Staff Physician [place] [facility] Pager: [phone], If no answer or between  15:00h - 7:00h: call [phone]  06/14/2014 8:53 AM

## 2014-06-14 NOTE — Progress Notes (Signed)
Spoke to wife over phone: recommended tracheostomy for prolonged mechanical ventilation and chronic critical illness. Wife wants to have conversation with him and MD face to face. Advised her to set face to face family meeting next several days possibly Monday 06/17/14 or tue 06/18/14 She agreed  Dr. Brand Males, M.D., [facility].C.P Pulmonary and Critical Care Medicine Staff Physician [place] [facility] Pager: [phone], If no answer or between  15:00h - 7:00h: call [phone]  06/14/2014 3:06 PM

## 2014-06-14 NOTE — Progress Notes (Signed)
[facility] [place]  Subjective  - sedated on vent   Objective 126/58 92 100.4 F (38 C) (Oral) 23 96%  Intake/Output Summary (Last 24 hours) at 06/14/14 0908 Last data filed at 06/14/14 0600  Gross per 24 hour  Intake   2961 ml  Output   3210 ml  Net   -249 ml   Abdomen soft at tube feed goal, incision healing Right groin with necrotic eschar sharply debrided at bedside similar to left, left groin granulating some no graft exposed at this point Feet warm perfused bilaterally   Assessment/Planning: Overall stable with slow improvement Hopefully groins will heal with nutrition Continue heparin no plans to start oral anticoagulation until more stable overall and not in need of further procedures Groin debridements and trach may be in his future, no rush to start oral anticoagulation  FIELDS,CHARLES E 06/14/2014 9:08 AM --  Laboratory Lab Results:  Recent Labs  06/13/14 0500 06/14/14 0500  WBC 13.0* 10.9*  HGB 9.8* 9.3*  HCT 30.0* 29.0*  PLT 322 324   BMET  Recent Labs  06/13/14 0450 06/14/14 0500  NA 143 150*  K 3.0* 3.3*  CL 98 102  CO2 32 34*  GLUCOSE 112* 121*  BUN 113* 122*  CREATININE 5.02* 5.18*  CALCIUM 8.2* 8.2*    COAG Lab Results  Component Value Date   INR 1.34 06/09/2014   INR 1.21 06/04/2014   INR 1.46 05/31/2014   No results found for: PTT

## 2014-06-15 ENCOUNTER — Inpatient Hospital Stay (HOSPITAL_COMMUNITY): Payer: Medicare Other

## 2014-06-15 DIAGNOSIS — T798XXD Other early complications of trauma, subsequent encounter: Secondary | ICD-10-CM

## 2014-06-15 DIAGNOSIS — N179 Acute kidney failure, unspecified: Secondary | ICD-10-CM

## 2014-06-15 DIAGNOSIS — E876 Hypokalemia: Secondary | ICD-10-CM

## 2014-06-15 DIAGNOSIS — T148XXA Other injury of unspecified body region, initial encounter: Secondary | ICD-10-CM

## 2014-06-15 DIAGNOSIS — L089 Local infection of the skin and subcutaneous tissue, unspecified: Secondary | ICD-10-CM

## 2014-06-15 DIAGNOSIS — I481 Persistent atrial fibrillation: Secondary | ICD-10-CM

## 2014-06-15 DIAGNOSIS — N189 Chronic kidney disease, unspecified: Secondary | ICD-10-CM

## 2014-06-15 LAB — CBC
HCT: 27.1 % — ABNORMAL LOW (ref 39.0–52.0)
Hemoglobin: 8.6 g/dL — ABNORMAL LOW (ref 13.0–17.0)
MCH: 29.1 pg (ref 26.0–34.0)
MCHC: 31.7 g/dL (ref 30.0–36.0)
MCV: 91.6 fL (ref 78.0–100.0)
Platelets: 292 10*3/uL (ref 150–400)
RBC: 2.96 MIL/uL — ABNORMAL LOW (ref 4.22–5.81)
RDW: 15.3 % (ref 11.5–15.5)
WBC: 8.5 10*3/uL (ref 4.0–10.5)

## 2014-06-15 LAB — BASIC METABOLIC PANEL
Anion gap: 16 — ABNORMAL HIGH (ref 5–15)
BUN: 120 mg/dL — ABNORMAL HIGH (ref 6–23)
CO2: 30 mmol/L (ref 19–32)
Calcium: 8 mg/dL — ABNORMAL LOW (ref 8.4–10.5)
Chloride: 96 mmol/L (ref 96–112)
Creatinine, Ser: 4.75 mg/dL — ABNORMAL HIGH (ref 0.50–1.35)
GFR calc Af Amer: 13 mL/min — ABNORMAL LOW (ref >90)
GFR calc non Af Amer: 11 mL/min — ABNORMAL LOW (ref >90)
Glucose, Bld: 160 mg/dL — ABNORMAL HIGH (ref 70–99)
Potassium: 3.3 mmol/L — ABNORMAL LOW (ref 3.5–5.1)
Sodium: 142 mmol/L (ref 135–145)

## 2014-06-15 LAB — RENAL FUNCTION PANEL
Albumin: 1.8 g/dL — ABNORMAL LOW (ref 3.5–5.2)
Anion gap: 12 (ref 5–15)
BUN: 124 mg/dL — ABNORMAL HIGH (ref 6–23)
CO2: 34 mmol/L — ABNORMAL HIGH (ref 19–32)
Calcium: 7.9 mg/dL — ABNORMAL LOW (ref 8.4–10.5)
Chloride: 99 mmol/L (ref 96–112)
Creatinine, Ser: 4.87 mg/dL — ABNORMAL HIGH (ref 0.50–1.35)
GFR calc Af Amer: 13 mL/min — ABNORMAL LOW (ref >90)
GFR calc non Af Amer: 11 mL/min — ABNORMAL LOW (ref >90)
Glucose, Bld: 147 mg/dL — ABNORMAL HIGH (ref 70–99)
Phosphorus: 5.5 mg/dL — ABNORMAL HIGH (ref 2.3–4.6)
Potassium: 2.8 mmol/L — ABNORMAL LOW (ref 3.5–5.1)
Sodium: 145 mmol/L (ref 135–145)

## 2014-06-15 LAB — GLUCOSE, CAPILLARY
Glucose-Capillary: 112 mg/dL — ABNORMAL HIGH (ref 70–99)
Glucose-Capillary: 113 mg/dL — ABNORMAL HIGH (ref 70–99)
Glucose-Capillary: 113 mg/dL — ABNORMAL HIGH (ref 70–99)
Glucose-Capillary: 131 mg/dL — ABNORMAL HIGH (ref 70–99)
Glucose-Capillary: 131 mg/dL — ABNORMAL HIGH (ref 70–99)
Glucose-Capillary: 137 mg/dL — ABNORMAL HIGH (ref 70–99)

## 2014-06-15 LAB — PREPARE RBC (CROSSMATCH)

## 2014-06-15 LAB — CK TOTAL AND CKMB (NOT AT ARMC)
CK, MB: 1.3 ng/mL (ref 0.3–4.0)
Relative Index: INVALID (ref 0.0–2.5)
Total CK: 37 U/L (ref 7–232)

## 2014-06-15 LAB — LACTIC ACID, PLASMA: Lactic Acid, Venous: 0.6 mmol/L (ref 0.5–2.0)

## 2014-06-15 LAB — TRIGLYCERIDES: Triglycerides: 223 mg/dL — ABNORMAL HIGH (ref <150)

## 2014-06-15 MED ORDER — POTASSIUM CHLORIDE 10 MEQ/50ML IV SOLN
10.0000 meq | INTRAVENOUS | Status: AC
  Administered 2014-06-15 (×3): 10 meq via INTRAVENOUS
  Filled 2014-06-15 (×3): qty 50

## 2014-06-15 MED ORDER — SODIUM CHLORIDE 0.9 % IV SOLN
Freq: Once | INTRAVENOUS | Status: AC
  Administered 2014-06-15: 14:00:00 via INTRAVENOUS

## 2014-06-15 NOTE — Progress Notes (Signed)
   Daily Progress Note  Assessment/Planning: POD #16 s/p ABF, L RA bypass   NEURO: sedated for vent  PULM:Some progress on vent wean but looks like trach will be needed per PCCM, Appreciate PCCM's assistance  CV: AF, Hypotensive, CK negative.   HEME: likely serial phleb. Giving relative anemia, give 1 unit pRBC rather than fluid  FEN: tube feeds at 60 cc/hr  GI: rectal tube in place, possible result of TF  REN: Non-oliguric renal failure: Cr slightly improved, responsive to diuretic at this point, appreciate nephrology input  Subjective  - 16 Days Post-Op  Non-responsive  Objective Filed Vitals:   06/15/14 0500 06/15/14 0600 06/15/14 0700 06/15/14 0757  BP: 93/45 91/45 88/42  88/42  Pulse: 68 67 61 77  Temp:   98.9 F (37.2 C)   TempSrc:   Oral   Resp: 21 18 18 29   Height:      Weight:  201 lb 8 oz (91.4 kg)    SpO2: 100% 100% 100% 98%    Intake/Output Summary (Last 24 hours) at 06/15/14 0853 Last data filed at 06/15/14 [id number]  Gross per 24 hour  Intake 2815.85 ml  Output   3040 ml  Net -224.15 ml   NEURO non-responsive PULM  CTAB, vent CV  Irr, irr GI  soft, NTND VASC  R groin: desiccated dead fat, no graft, L groin: clean, viable fat, no graft; Faintly palpable R DP, dopplerable L DP  Laboratory CBC    Component Value Date/Time   WBC 8.5 06/15/2014 0350   HGB 8.6* 06/15/2014 0350   HCT 27.1* 06/15/2014 0350   PLT 292 06/15/2014 0350    BMET    Component Value Date/Time   NA 145 06/15/2014 0350   K 2.8* 06/15/2014 0350   CL 99 06/15/2014 0350   CO2 34* 06/15/2014 0350   GLUCOSE 147* 06/15/2014 0350   BUN 124* 06/15/2014 0350   CREATININE 4.87* 06/15/2014 0350   CREATININE 2.02* 05/03/2014 0829   CALCIUM 7.9* 06/15/2014 0350   GFRNONAA 11* 06/15/2014 0350   GFRAA 13* 06/15/2014 0350    Adele Barthel, MD [facility] [place] Office: [phone] Pager: [phone]  06/15/2014, 8:53 AM

## 2014-06-15 NOTE — Progress Notes (Signed)
[facility] ROUNDING NOTE   Subjective:   Remains intubated with discussion to be held about trach Non-oliguric on lasix Reduced dose to 80 Q12H yesterday and UOP remains quite good Weight down again to 91.4 kg Creatinine in the 4's today Edema improving BP's low side   Objective:   BP 88/42 mmHg  Pulse 77  Temp(Src) 98.9 F (37.2 C) (Oral)  Resp 29  Ht 5\' 7"  (1.702 m)  Wt 91.4 kg (201 lb 8 oz)  BMI 31.55 kg/m2  SpO2 98%  I/O last 3 completed shifts: In: 5153.1 [I.V.:1103.1; Other:220; ET:[id number]; IV Piggyback:350] Out: [id number]; Stool:800] Total I/O In: 58 [IV Piggyback:50] Out: -   Physical Exam: Intubated, sedated OTT/NG/Left IJ trialysis catheter (1/13) VS as noted Lungs ant clear though BS coarse S1S2 No S3 Abd incision less ruborous Overall edema much improved in LE's scrotal edema down Foley with clear urine  Weight Trending 1/7 89.6 kg 1/10 105.5 kg 1/12 108.2 kg 1/13 108.9 kg   CRRT started 1/14 106.9 kg 1/15 104.6 kg   CRRT stopped 1/16 102 kg 1/17 98.6 kg 1/19 96.8 kg 1/20 98 kg 1/21 95.8 kg  1/22 92.2 kg 1/23 91.4 kg   Recent Labs Lab 06/11/14 0350  06/12/14 0430 06/12/14 1605 06/13/14 0450 06/14/14 0500 06/15/14 0350  NA 143  < > 145 145 143 150* 145  K 2.9*  < > 3.0* 2.9* 3.0* 3.3* 2.8*  CL 102  < > 99 99 98 102 99  CO2 28  < > 32 31 32 34* 34*  GLUCOSE 118*  < > 121* 110* 112* 121* 147*  BUN 103*  < > 111* 112* 113* 122* 124*  CREATININE 4.91*  < > 4.91* 5.00* 5.02* 5.18* 4.87*  CALCIUM 8.4  < > 8.4 8.3* 8.2* 8.2* 7.9*  MG 1.8  --  1.7  --   --   --   --   PHOS 5.4*  < > 5.6* 5.7* 5.7* 5.8* 5.5*  < > = values in this interval not displayed.   Recent Labs Lab 06/12/14 1605 06/13/14 0450 06/13/14 1203 06/14/14 0500 06/15/14 0350  ALBUMIN 1.9* 1.9* 1.9* 1.9* 1.8*   CBC:  Recent Labs Lab 06/10/14 0445 06/11/14 0350 06/13/14 0500 06/14/14 0500 06/15/14 0350  WBC 10.9* 12.3* 13.0* 10.9*  8.5  HGB 10.1* 10.1* 9.8* 9.3* 8.6*  HCT 30.4* 30.4* 30.0* 29.0* 27.1*  MCV 89.7 91.0 91.7 91.5 91.6  PLT 308 306 322 324 292    Recent Labs Lab 06/14/14 1537 06/14/14 2005 06/15/14 0012 06/15/14 0415 06/15/14 0724  GLUCAP 82 123* 112* 131* 131*    Imaging: Dg Chest Port 1 View  06/15/2014   CLINICAL DATA:  Acute respiratory failure with hypoxia  EXAM: PORTABLE CHEST - 1 VIEW  COMPARISON:  Radiograph 06/14/2014  FINDINGS: Endotracheal tube 3.6 cm from carina. Interval removal of left IJ line. Right subclavian line with tip in the right atrium is unchanged. Left IJ line with tip in the distal SVC is unchanged. Stable cardiac silhouette. Mild basilar atelectasis is present. Mild airspace disease bases suggesting edema.  IMPRESSION: 1. New right IJ line. 2. Left subclavian line remains with tip in the right atrium. 3. Mild basilar airspace disease suggesting edema.   Electronically Signed   By: Suzy Bouchard M.D.   On: 06/15/2014 09:09   Dg Chest Port 1 View  06/14/2014   CLINICAL DATA:  Central line placement  EXAM: PORTABLE CHEST - 1 VIEW  COMPARISON:  06/14/2014  FINDINGS: Cardiomediastinal silhouette is stable. Status post median sternotomy. Stable endotracheal and NG tube position. Stable bilateral IJ central line with tip in SVC right atrium junction. There is a new right subclavian central line with tip in right atrium. For distal SVC position should be retracted about 5 cm. No pneumothorax. Residual mild interstitial edema with improvement in aeration.  IMPRESSION: Stable bilateral IJ central line. Stable endotracheal and NG tube position. New right subclavian central line with tip in right atrium. No pneumothorax. Residual mild interstitial prominence with improvement in aeration.   Electronically Signed   By: Lahoma Crocker M.D.   On: 06/14/2014 17:07   Dg Chest Port 1 View  06/14/2014   CLINICAL DATA:  Hypoxia  EXAM: PORTABLE CHEST - 1 VIEW  COMPARISON:  June 13, 2014  FINDINGS:  Endotracheal tube tip is 4.5 cm above the carina. Nasogastric tube tip and side port are below the diaphragm. Central catheter tips are in the superior vena cava, stable. No pneumothorax. Mild generalized interstitial edema remains. There is no airspace consolidation. No new opacity. Heart is borderline enlarged with pulmonary vascularity within normal limits. No adenopathy.  IMPRESSION: Tube and catheter positions as described without pneumothorax. Heart slightly prominent with mild edema. Question a degree of underlying congestive heart failure. No airspace consolidation.   Electronically Signed   By: Lowella Grip M.D.   On: 06/14/2014 07:58     Medications:   Infusions . sodium chloride 10 mL/hr at 06/15/14 0700  . fentaNYL infusion INTRAVENOUS 40 mcg/hr (06/15/14 0700)  . propofol 35 mcg/kg/min (06/15/14 0700)   Scheduled Medications . sodium chloride   Intravenous Once  . antiseptic oral rinse  7 mL Mouth Rinse q12n4p  . carvedilol  6.25 mg Per Tube BID  . cefTRIAXone (ROCEPHIN)  IV  1 g Intravenous Q24H  . chlorhexidine  15 mL Mouth Rinse BID  . enoxaparin (LOVENOX) injection  120 mg Subcutaneous Q24H  . feeding supplement (PRO-STAT SUGAR FREE 64)  30 mL Per Tube BID BM  . feeding supplement (VITAL HIGH PROTEIN)  1,000 mL Per Tube Q24H  . free water  300 mL Per Tube 6 times per day  . furosemide  80 mg Intravenous Every 12 hours (non-specified)  . insulin aspart  0-9 Units Subcutaneous 6 times per day  . pantoprazole sodium  40 mg Per Tube Q24H  . potassium chloride  10 mEq Intravenous Q1 Hr x 3   acetaminophen (TYLENOL) oral liquid 160 mg/5 mL, albuterol, fentaNYL, hydrALAZINE, LORazepam, ondansetron  Assessment/ Plan:   69 yo male with background of CAD s/p CABG, history of R CEA/bilat LE stenting, HLD, tobacco use, CKD III (baseline creatinine 1.8-2), OSA on CPAP, DM, HTN, mild COPD who developed ischemic ATN following aortobifemoral bypass graft and left renal artery  bypass on 05/30/14 with pressor dependent hypotension/ABLA contributing. Required CRRT 06/05/14-06/07/14. Monitoring for recovery of function. Course otherwise complicated by VDRF (required reintubation 1/17), infected groin wounds and HFlu from trach aspirate  Acute renal failure on baseline chronic kidney disease stage III (1.8-2) post Aobifem and L RA bypass d/t hypotension/ABLA  s/p CRRT 1/13-1/15.   UOP has been excellent with lasix.  Weight slowly dropping.  Think we should hold diuretics today as he is looking dry enough Stop lasix - reassess for need in AM Creatinine down today to 4.87  Hypernatremia - Sodium improved with increased free water via tube - now at 300 ml Q4H. Holding lasix will help this  as well.   Anemia: Hb drifting down. Check iron studies. Replete if low.   Status post aortobifemoral bypass grafting and left renal artery bypass:  Wounds per VVS Vanco/zosyn started 1/19; now just zosyn (EColi in groin wound)  HFlu trach asp on Zosyn  VDRF: Ventilator management per CCM.  Reintubated d/t inability to clear secretions.  Discussions to be held about trach Per CCM.  Atrial fib Rate controlled.  On small dose carvedilol  H/O CAD  HTN - BP's remain soft.  Stopped  Amlodipine 1/22 Nutrition - per primary team   Barry Maes, MD [place] [phone] Pager 06/15/2014, 9:03 AM

## 2014-06-15 NOTE — Progress Notes (Signed)
PULMONARY / CRITICAL CARE MEDICINE   Name: Barry Lawrence MRN: [place]:[id number] DOB: 1945-07-16   LOS 16 days   ADMISSION DATE:  05/30/2014 CONSULTATION DATE:  05/30/2014  REFERRING MD :  [name]   CHIEF COMPLAINT:  Vent management   INITIAL PRESENTATION:  69 yo male smoker for repair of AAA.  Remained on vent post-op.  PMHx of CAD s/p CABG, CKD III, OSA on CPAP, DM, HTN, mild COPD.    SIGNIFICANT EVENTS: 1/07 OR>> elective repair AAA 1/10 transfuse 2 units PRBC, renal consulted 06/05/14 - 06/07/14 - s/p CRRT 06/08/14 - EXTUBATED 1/17 re- intubated urgently 1/18 bradycardia from precedex >> change to diprivan 1/19 Fever >> add Abx 06/13/14: Tolerating pressure support, but RR still high. 1/22 PSV 6 hours, CVL changed    SUBJECTIVE/OVERNIGHT/INTERVAL HX No acute events, CVL changed   VITAL SIGNS: Temp:  [98 F (36.7 C)-100.4 F (38 C)] 100.2 F (37.9 C) (01/23 0400) Pulse Rate:  [66-93] 67 (01/23 0600) Resp:  [14-27] 18 (01/23 0600) BP: (85-174)/(41-84) 91/45 mmHg (01/23 0600) SpO2:  [96 %-100 %] 100 % (01/23 0600) FiO2 (%):  [40 %] 40 % (01/23 0500) Weight:  [91.4 kg (201 lb 8 oz)] 91.4 kg (201 lb 8 oz) (01/23 0600)   HEMODYNAMICS: CVP:  [6 mmHg-16 mmHg] 11 mmHg   VENTILATOR SETTINGS: Vent Mode:  [-] PRVC FiO2 (%):  [40 %] 40 % Set Rate:  [18 bmp] 18 bmp Vt Set:  [530 mL] 530 mL PEEP:  [5 cmH20] 5 cmH20 Pressure Support:  [5 cmH20-10 cmH20] 10 cmH20 Plateau Pressure:  [18 cmH20-19 cmH20] 19 cmH20   INTAKE / OUTPUT:  Intake/Output Summary (Last 24 hours) at 06/15/14 0642 Last data filed at 06/15/14 0600  Gross per 24 hour  Intake 3295.55 ml  Output   3200 ml  Net  95.55 ml   PHYSICAL EXAMINATION: Gen: sedated on vent HEENT: NCAT, ETT in place PULM rhonchi bilaterally CV: Irreg irreg, normal rage AB: midline scar well healed, BS+, soft Ext: warm Neuro: sedated on vent  LABS:  PULMONARY  Recent Labs Lab 06/08/14 1545 06/09/14 0400 06/09/14 0601  06/10/14 0403  PHART 7.335* 7.275* 7.311* 7.387  PCO2ART 47.2* 60.2* 55.1* 44.9  PO2ART 67.0* 79.0* 157.0* 86.0  HCO3 25.1* 27.9* 27.8* 27.0*  TCO2 27 30 29 28   O2SAT 91.0 93.0 99.0 96.0    CBC  Recent Labs Lab 06/13/14 0500 06/14/14 0500 06/15/14 0350  HGB 9.8* 9.3* 8.6*  HCT 30.0* 29.0* 27.1*  WBC 13.0* 10.9* 8.5  PLT 322 324 292    COAGULATION  Recent Labs Lab 06/09/14 0353  INR 1.34    CARDIAC  No results for input(s): TROPONINI in the last 168 hours. No results for input(s): PROBNP in the last 168 hours.   CHEMISTRY  Recent Labs Lab 06/11/14 0350  06/12/14 0430 06/12/14 1605 06/13/14 0450 06/14/14 0500 06/15/14 0350  NA 143  < > 145 145 143 150* 145  K 2.9*  < > 3.0* 2.9* 3.0* 3.3* 2.8*  CL 102  < > 99 99 98 102 99  CO2 28  < > 32 31 32 34* 34*  GLUCOSE 118*  < > 121* 110* 112* 121* 147*  BUN 103*  < > 111* 112* 113* 122* 124*  CREATININE 4.91*  < > 4.91* 5.00* 5.02* 5.18* 4.87*  CALCIUM 8.4  < > 8.4 8.3* 8.2* 8.2* 7.9*  MG 1.8  --  1.7  --   --   --   --  PHOS 5.4*  < > 5.6* 5.7* 5.7* 5.8* 5.5*  < > = values in this interval not displayed. Estimated Creatinine Clearance: 15.6 mL/min (by C-G formula based on Cr of 4.87).   LIVER  Recent Labs Lab 06/09/14 0353  06/12/14 1605 06/13/14 0450 06/13/14 1203 06/14/14 0500 06/15/14 0350  ALBUMIN 2.1*  < > 1.9* 1.9* 1.9* 1.9* 1.8*  INR 1.34  --   --   --   --   --   --   < > = values in this interval not displayed.   INFECTIOUS  Recent Labs Lab 06/15/14 0350  LATICACIDVEN 0.6     ENDOCRINE CBG (last 3)   Recent Labs  06/14/14 2005 06/15/14 0012 06/15/14 0415  GLUCAP 123* 112* 131*         IMAGING x48h Dg Chest Port 1 View  06/14/2014   CLINICAL DATA:  Central line placement  EXAM: PORTABLE CHEST - 1 VIEW  COMPARISON:  06/14/2014  FINDINGS: Cardiomediastinal silhouette is stable. Status post median sternotomy. Stable endotracheal and NG tube position. Stable bilateral  IJ central line with tip in SVC right atrium junction. There is a new right subclavian central line with tip in right atrium. For distal SVC position should be retracted about 5 cm. No pneumothorax. Residual mild interstitial edema with improvement in aeration.  IMPRESSION: Stable bilateral IJ central line. Stable endotracheal and NG tube position. New right subclavian central line with tip in right atrium. No pneumothorax. Residual mild interstitial prominence with improvement in aeration.   Electronically Signed   By: Lahoma Crocker M.D.   On: 06/14/2014 17:07   Dg Chest Port 1 View  06/14/2014   CLINICAL DATA:  Hypoxia  EXAM: PORTABLE CHEST - 1 VIEW  COMPARISON:  June 13, 2014  FINDINGS: Endotracheal tube tip is 4.5 cm above the carina. Nasogastric tube tip and side port are below the diaphragm. Central catheter tips are in the superior vena cava, stable. No pneumothorax. Mild generalized interstitial edema remains. There is no airspace consolidation. No new opacity. Heart is borderline enlarged with pulmonary vascularity within normal limits. No adenopathy.  IMPRESSION: Tube and catheter positions as described without pneumothorax. Heart slightly prominent with mild edema. Question a degree of underlying congestive heart failure. No airspace consolidation.   Electronically Signed   By: Lowella Grip M.D.   On: 06/14/2014 07:58   Dg Chest Port 1 View  06/13/2014   CLINICAL DATA:  Ventilator dependent respiratory failure  EXAM: PORTABLE CHEST - 1 VIEW  COMPARISON:  Portable exam 0644 hr compared to 06/12/2014  FINDINGS: Tip of endotracheal tube projects 4.7 cm above carina.  Tips of BILATERAL jugular catheters project over SVC.  Nasogastric tube extends into stomach.  Normal heart size post median sternotomy and CABG.  Atherosclerotic calcification aorta.  Pulmonary vascular congestion with scattered interstitial prominence bilaterally question mild pulmonary edema.  No pleural effusion or pneumothorax.   Bones demineralized.  IMPRESSION: Stable line and tube positions.  Probable mild pulmonary edema.   Electronically Signed   By: Lavonia Dana M.D.   On: 06/13/2014 07:56       ASSESSMENT / PLAN:  PULMONARY OETT 01/07 >>>1/16>>re intubated 1/17>> A: Hx of OSA, COPD Acute respiratory failure with hypoxia following surgery for AAA repair. Re-intubated 1/17 due to airway protection and WOB  Needs trach  P:   Recommend trach, wife considering, see note from Dr. Chase Caller 1/22 PSV as tolerated   CARDIOVASCULAR R [place] 01/07 >> 1/09 Rt  [place] CVL 1/22 >> Lt IJ HD cath 1/14 >> A:  Hypovolemic/hemorrhagic shock after AAA repair >> resolved A fib Hx of CAD s/p CABG, HTN, HLD, PAD P:  Continue coreg, lasix, norvasc Full dose lovenox >> coumadin if okay with surgery  RENAL A:   Acute on chronic CKD III >> baseline creatinine 1.8 to 2.1 Post op  ATN  in setting of hypovolemic/hemorrhagic shock > s/p CRRT ending 06/08/14 Hypokalemia P:   Per renal Gave KCL 3 runs 1/23 AM, repeat BMET  GASTROINTESTINAL A:   Nutrition Hx of GERD P:   TF's running Protonix for SUP  HEMATOLOGIC A:  Acute blood loss anemia Thrombocytopenia >> resolved P: F/u CBC Transfuse for Hb < 7  INFECTIOUS A:   Fever >> possible sources HCAP and Lt groin wound P:   Ceftriaxone for e.coli from wound > plan through 1/25 Wound care  Blood 1/19 >> Wound 1/19 >> E coli  ENDOCRINE A:   No acute issues P:   SSI while on tube feeds  NEUROLOGIC A:   Acute metabolic encephalopathy P:   Diprivan, fentanyl (check ck and lactitc 06/15/14)  RASS goal: 0   Summary: Day 16 on vent.  He will benefit from trach/LTAC    CC time 30 minutes  Roselie Awkward, MD [place] [facility] Pager: [phone] Cell: [phone] If no response, call [phone]   06/15/2014 6:42 AM

## 2014-06-16 LAB — GLUCOSE, CAPILLARY
Glucose-Capillary: 119 mg/dL — ABNORMAL HIGH (ref 70–99)
Glucose-Capillary: 128 mg/dL — ABNORMAL HIGH (ref 70–99)
Glucose-Capillary: 141 mg/dL — ABNORMAL HIGH (ref 70–99)
Glucose-Capillary: 153 mg/dL — ABNORMAL HIGH (ref 70–99)

## 2014-06-16 LAB — RENAL FUNCTION PANEL
Albumin: 1.8 g/dL — ABNORMAL LOW (ref 3.5–5.2)
Anion gap: 13 (ref 5–15)
BUN: 125 mg/dL — ABNORMAL HIGH (ref 6–23)
CO2: 33 mmol/L — ABNORMAL HIGH (ref 19–32)
Calcium: 8 mg/dL — ABNORMAL LOW (ref 8.4–10.5)
Chloride: 97 mmol/L (ref 96–112)
Creatinine, Ser: 4.29 mg/dL — ABNORMAL HIGH (ref 0.50–1.35)
GFR calc Af Amer: 15 mL/min — ABNORMAL LOW (ref >90)
GFR calc non Af Amer: 13 mL/min — ABNORMAL LOW (ref >90)
Glucose, Bld: 147 mg/dL — ABNORMAL HIGH (ref 70–99)
Phosphorus: 5.1 mg/dL — ABNORMAL HIGH (ref 2.3–4.6)
Potassium: 2.9 mmol/L — ABNORMAL LOW (ref 3.5–5.1)
Sodium: 143 mmol/L (ref 135–145)

## 2014-06-16 LAB — CBC WITH DIFFERENTIAL/PLATELET
Basophils Absolute: 0.1 10*3/uL (ref 0.0–0.1)
Basophils Relative: 1 % (ref 0–1)
Eosinophils Absolute: 0.2 10*3/uL (ref 0.0–0.7)
Eosinophils Relative: 3 % (ref 0–5)
HCT: 29.7 % — ABNORMAL LOW (ref 39.0–52.0)
Hemoglobin: 9.6 g/dL — ABNORMAL LOW (ref 13.0–17.0)
Lymphocytes Relative: 11 % — ABNORMAL LOW (ref 12–46)
Lymphs Abs: 0.8 10*3/uL (ref 0.7–4.0)
MCH: 29.5 pg (ref 26.0–34.0)
MCHC: 32.3 g/dL (ref 30.0–36.0)
MCV: 91.4 fL (ref 78.0–100.0)
Monocytes Absolute: 0.8 10*3/uL (ref 0.1–1.0)
Monocytes Relative: 10 % (ref 3–12)
Neutro Abs: 5.6 10*3/uL (ref 1.7–7.7)
Neutrophils Relative %: 75 % (ref 43–77)
Platelets: 289 10*3/uL (ref 150–400)
RBC: 3.25 MIL/uL — ABNORMAL LOW (ref 4.22–5.81)
RDW: 15.1 % (ref 11.5–15.5)
WBC: 7.6 10*3/uL (ref 4.0–10.5)

## 2014-06-16 LAB — BASIC METABOLIC PANEL
Anion gap: 11 (ref 5–15)
BUN: 106 mg/dL — ABNORMAL HIGH (ref 6–23)
CO2: 31 mmol/L (ref 19–32)
Calcium: 7.2 mg/dL — ABNORMAL LOW (ref 8.4–10.5)
Chloride: 99 mmol/L (ref 96–112)
Creatinine, Ser: 3.58 mg/dL — ABNORMAL HIGH (ref 0.50–1.35)
GFR calc Af Amer: 19 mL/min — ABNORMAL LOW (ref >90)
GFR calc non Af Amer: 16 mL/min — ABNORMAL LOW (ref >90)
Glucose, Bld: 279 mg/dL — ABNORMAL HIGH (ref 70–99)
Potassium: 3.1 mmol/L — ABNORMAL LOW (ref 3.5–5.1)
Sodium: 141 mmol/L (ref 135–145)

## 2014-06-16 LAB — TYPE AND SCREEN
ABO/RH(D): A POS
Antibody Screen: NEGATIVE
Unit division: 0

## 2014-06-16 LAB — HEPARIN ANTI-XA: Heparin LMW: 0.73 IU/mL

## 2014-06-16 MED ORDER — POTASSIUM CHLORIDE 10 MEQ/50ML IV SOLN
10.0000 meq | INTRAVENOUS | Status: AC
Start: 2014-06-16 — End: 2014-06-16
  Administered 2014-06-16 (×3): 10 meq via INTRAVENOUS
  Filled 2014-06-16 (×2): qty 50

## 2014-06-16 MED ORDER — POTASSIUM CHLORIDE 20 MEQ/15ML (10%) PO SOLN
40.0000 meq | Freq: Once | ORAL | Status: AC
  Administered 2014-06-16: 40 meq
  Filled 2014-06-16 (×2): qty 30

## 2014-06-16 MED ORDER — CETYLPYRIDINIUM CHLORIDE 0.05 % MT LIQD
7.0000 mL | OROMUCOSAL | Status: DC
  Administered 2014-06-16 – 2014-06-18 (×7): 7 mL via OROMUCOSAL

## 2014-06-16 MED ORDER — POTASSIUM CHLORIDE 10 MEQ/50ML IV SOLN
INTRAVENOUS | Status: AC
Start: 2014-06-16 — End: 2014-06-16
  Filled 2014-06-16: qty 50

## 2014-06-16 NOTE — Progress Notes (Signed)
[facility] Physician Progress Note and Electrolyte Replacement  Patient Name: Barry Lawrence DOB: 1945-09-11 MRN: [id number]  Date of Service  06/16/2014   HPI/Events of Note    Recent Labs Lab 06/11/14 0350  06/12/14 0430 06/12/14 1605 06/13/14 0450 06/14/14 0500 06/15/14 0350 06/15/14 1100 06/16/14 0340 06/16/14 1600  NA 143  < > 145 145 143 150* 145 142 143 141  K 2.9*  < > 3.0* 2.9* 3.0* 3.3* 2.8* 3.3* 2.9* 3.1*  CL 102  < > 99 99 98 102 99 96 97 99  CO2 28  < > 32 31 32 34* 34* 30 33* 31  GLUCOSE 118*  < > 121* 110* 112* 121* 147* 160* 147* 279*  BUN 103*  < > 111* 112* 113* 122* 124* 120* 125* 106*  CREATININE 4.91*  < > 4.91* 5.00* 5.02* 5.18* 4.87* 4.75* 4.29* 3.58*  CALCIUM 8.4  < > 8.4 8.3* 8.2* 8.2* 7.9* 8.0* 8.0* 7.2*  MG 1.8  --  1.7  --   --   --   --   --   --   --   PHOS 5.4*  < > 5.6* 5.7* 5.7* 5.8* 5.5*  --  5.1*  --   < > = values in this interval not displayed.  Estimated Creatinine Clearance: 21.4 mL/min (by C-G formula based on Cr of 3.58).  Intake/Output      01/23 0701 - 01/24 0700 01/24 0701 - 01/25 0700   I.V. (mL/kg) 896.7 (9.7) 233.4 (2.5)   Blood 336.3    Other     NG/GT 1380 940   IV Piggyback 250 50   Total Intake(mL/kg) 2863 (31) 1223.4 (13.2)   Urine (mL/kg/hr) 2480 (1.1) 1115 (1.1)   Stool 500 (0.2)    Total Output 2980 1115   Net -117.1 +108.4         - I/O DETAILED x 24h    Total I/O In: 1223.4 [I.V.:233.4; NG/GT:940; IV Piggyback:50] Out: 1115 [Urine:1115] - I/O THIS SHIFT    ASSESSMENT Low K High creat- but improving and making urine  eICURN Interventions  kcl via panda x  [id number]   ASSESSMENT: MAJOR ELECTROLYTE      Dr. Brand Males, M.D., [facility].C.P Pulmonary and Critical Care Medicine Staff Physician [facility] Pager: [phone], If no answer or between  15:00h - 7:00h: call [phone]  06/16/2014 5:52 PM

## 2014-06-16 NOTE — Progress Notes (Signed)
ANTICOAGULATION CONSULT NOTE - Follow Up Consult  Pharmacy Consult for Lovenox Indication: atrial fibrillation  Allergies  Allergen Reactions  . Niaspan [Niacin Er] Hives    Patient Measurements: Height: 5\' 7"  (170.2 cm) Weight: 203 lb 11.3 oz (92.4 kg) IBW/kg (Calculated) : 66.1  Vital Signs: Temp: 99.5 F (37.5 C) (01/24 1115) Temp Source: Axillary (01/24 1115) BP: 111/96 mmHg (01/24 1400) Pulse Rate: 90 (01/24 1400)  Labs:  Recent Labs  06/14/14 0500 06/15/14 0350 06/15/14 1100 06/16/14 0340  HGB 9.3* 8.6*  --  9.6*  HCT 29.0* 27.1*  --  29.7*  PLT 324 292  --  289  CREATININE 5.18* 4.87* 4.75* 4.29*  CKTOTAL  --  37  --   --   CKMB  --  1.3  --   --     Estimated Creatinine Clearance: 17.9 mL/min (by C-G formula based on Cr of 4.29).  Assessment: 69yom s/p aortobifemoral bypass graft/left renal artery bypass on 1/7 developed post-op afib.    Coumadin stopped on 1/17 to allow better wound healing. Treatment dose lovenox has been continued since that time. Level drawn 1/21 was slightly below goal, dose adjusted, new level today was within therapeutic range at 0.73. No bleeding issues noted. Hgb trending up to 9.6 today.   Scr continues to trend down. Will continue at current dose. Will plan on rechecking LMWH level later in the week or sooner if there is a significant improvement in renal function.  Goal of Therapy:  Anti-Xa level 0.6-1 units/ml 4hrs after LMWH dose given Monitor platelets by anticoagulation protocol: Yes   Plan:  1) Continue Lovenox 120mg  sq q24 2) CBC q72  Erin Hearing PharmD., BCPS Clinical Pharmacist Pager [phone] 06/16/2014 2:50 PM

## 2014-06-16 NOTE — Progress Notes (Signed)
E link called re:  BMET results

## 2014-06-16 NOTE — Progress Notes (Signed)
[place] Progress Note Patient Name: Barry Lawrence DOB: 02-16-1946 MRN: [id number]   Date of Service  06/16/2014  HPI/Events of Note  K 2.9  eICU Interventions  IV KCL ordered     Intervention Category Major Interventions: Electrolyte abnormality - evaluation and management  Asencion Noble 06/16/2014, 4:53 AM

## 2014-06-16 NOTE — Progress Notes (Signed)
[facility] ROUNDING NOTE   Subjective:   Remains intubated  For probable trach tomorrow Remains non-oliguric Held lasix as of yesterday (weight was down 17 kg) TF's going now (getting "high protein" Vital) Getting 3rd of 3 K runs for K 2.9  Objective:   BP 122/50 mmHg  Pulse 80  Temp(Src) 100.2 F (37.9 C) (Oral)  Resp 30  Ht 5\' 7"  (1.702 m)  Wt 92.4 kg (203 lb 11.3 oz)  BMI 31.90 kg/m2  SpO2 100%  I/O last 3 completed shifts: In: 4777.1 [I.V.:1370.8; Blood:336.3; Other:120; NG/GT:2700; IV Piggyback:250] Out: 4700 [Urine:3700; Stool:1000]   Physical Exam: Intubated, sedated OTT/NG/Left IJ trialysis catheter (1/13) VS as noted Lungs ant clear though BS coarse S1S2 No S3 Abd incision less ruborous Overall edema markedly  improved in LE's (though still some present) Scrotal edema down Foley with clear urine  Weight Trending 1/7 89.6 kg 1/10 105.5 kg 1/13 108.9 kg   CRRT started 1/14 106.9 kg 1/15 104.6 kg   CRRT stopped 1/16 102 kg 1/17 98.6 kg 1/19 96.8 kg 1/21 95.8 kg  1/22 92.2 kg 1/23 91.4 kg 1/24 92.4 kg   Recent Labs Lab 06/11/14 0350  06/12/14 0430 06/12/14 1605 06/13/14 0450 06/14/14 0500 06/15/14 0350 06/15/14 1100 06/16/14 0340  NA 143  < > 145 145 143 150* 145 142 143  K 2.9*  < > 3.0* 2.9* 3.0* 3.3* 2.8* 3.3* 2.9*  CL 102  < > 99 99 98 102 99 96 97  CO2 28  < > 32 31 32 34* 34* 30 33*  GLUCOSE 118*  < > 121* 110* 112* 121* 147* 160* 147*  BUN 103*  < > 111* 112* 113* 122* 124* 120* 125*  CREATININE 4.91*  < > 4.91* 5.00* 5.02* 5.18* 4.87* 4.75* 4.29*  CALCIUM 8.4  < > 8.4 8.3* 8.2* 8.2* 7.9* 8.0* 8.0*  MG 1.8  --  1.7  --   --   --   --   --   --   PHOS 5.4*  < > 5.6* 5.7* 5.7* 5.8* 5.5*  --  5.1*  < > = values in this interval not displayed.   Recent Labs Lab 06/13/14 0450 06/13/14 1203 06/14/14 0500 06/15/14 0350 06/16/14 0340  ALBUMIN 1.9* 1.9* 1.9* 1.8* 1.8*   CBC:  Recent Labs Lab 06/11/14 0350  06/13/14 0500 06/14/14 0500 06/15/14 0350 06/16/14 0340  WBC 12.3* 13.0* 10.9* 8.5 7.6  NEUTROABS  --   --   --   --  5.6  HGB 10.1* 9.8* 9.3* 8.6* 9.6*  HCT 30.4* 30.0* 29.0* 27.1* 29.7*  MCV 91.0 91.7 91.5 91.6 91.4  PLT 306 322 324 292 289    Recent Labs Lab 06/15/14 1208 06/15/14 1629 06/15/14 1933 06/16/14 0014 06/16/14 0754  GLUCAP 137* 113* 113* 119* 128*    Imaging: Dg Chest Port 1 View  06/15/2014   CLINICAL DATA:  Acute respiratory failure with hypoxia  EXAM: PORTABLE CHEST - 1 VIEW  COMPARISON:  Radiograph 06/14/2014  FINDINGS: Endotracheal tube 3.6 cm from carina. Interval removal of left IJ line. Right subclavian line with tip in the right atrium is unchanged. Left IJ line with tip in the distal SVC is unchanged. Stable cardiac silhouette. Mild basilar atelectasis is present. Mild airspace disease bases suggesting edema.  IMPRESSION: 1. New right IJ line. 2. Left subclavian line remains with tip in the right atrium. 3. Mild basilar airspace disease suggesting edema.   Electronically Signed  By: Suzy Bouchard M.D.   On: 06/15/2014 09:09   Dg Chest Port 1 View  06/14/2014   CLINICAL DATA:  Central line placement  EXAM: PORTABLE CHEST - 1 VIEW  COMPARISON:  06/14/2014  FINDINGS: Cardiomediastinal silhouette is stable. Status post median sternotomy. Stable endotracheal and NG tube position. Stable bilateral IJ central line with tip in SVC right atrium junction. There is a new right subclavian central line with tip in right atrium. For distal SVC position should be retracted about 5 cm. No pneumothorax. Residual mild interstitial edema with improvement in aeration.  IMPRESSION: Stable bilateral IJ central line. Stable endotracheal and NG tube position. New right subclavian central line with tip in right atrium. No pneumothorax. Residual mild interstitial prominence with improvement in aeration.   Electronically Signed   By: Lahoma Crocker M.D.   On: 06/14/2014 17:07      Medications:   Infusions . sodium chloride 10 mL/hr at 06/16/14 0700  . fentaNYL infusion INTRAVENOUS 40 mcg/hr (06/16/14 0700)  . propofol 39.723 mcg/kg/min (06/16/14 0700)   Scheduled Medications . antiseptic oral rinse  7 mL Mouth Rinse q12n4p  . carvedilol  6.25 mg Per Tube BID  . cefTRIAXone (ROCEPHIN)  IV  1 g Intravenous Q24H  . chlorhexidine  15 mL Mouth Rinse BID  . enoxaparin (LOVENOX) injection  120 mg Subcutaneous Q24H  . feeding supplement (PRO-STAT SUGAR FREE 64)  30 mL Per Tube BID BM  . feeding supplement (VITAL HIGH PROTEIN)  1,000 mL Per Tube Q24H  . free water  300 mL Per Tube 6 times per day  . insulin aspart  0-9 Units Subcutaneous 6 times per day  . pantoprazole sodium  40 mg Per Tube Q24H   acetaminophen (TYLENOL) oral liquid 160 mg/5 mL, albuterol, fentaNYL, hydrALAZINE, LORazepam, ondansetron  Assessment/ Plan:   69 yo male with background of CAD s/p CABG, history of R CEA/bilat LE stenting, HLD, tobacco use, CKD III (baseline creatinine 1.8-2), OSA on CPAP, DM, HTN, mild COPD who developed ischemic ATN following aortobifemoral bypass graft and left renal artery bypass on 05/30/14 with pressor dependent hypotension/ABLA contributing. Required CRRT 06/05/14-06/07/14. Monitoring for recovery of function. Course otherwise complicated by VDRF (required reintubation 1/17), infected groin wounds and HFlu from trach aspirate  Acute renal failure on baseline chronic kidney disease stage III (1.8-2) post Aobifem and L RA bypass d/t hypotension/ABLA  s/p CRRT 1/13-1/15, then non-oliguric->lasix.   Weight down 17 kg Holding lasix as of 1/23 (and for future was responding exceedingly well to 80 mg doses)  Reassess and dose prn  Creatinine down today to 4.29 (baseline 1.8-2)  Hypernatremia - Sodium improving with increased free water via tube - now at 300 ml Q4H.   Hypokalemia - requiring intermittent replacement S/p 3 runs this AM  Anemia: Hb just under 10.   TSat was low 1/9 but with ferritin 3000 and active infection was not replaced  Status post aortobifemoral bypass grafting and left renal artery bypass:  Wounds per VVS Vanco/zosyn started 1/19; Now rocephin (EColi from groind wounds)  HFlu trach asp on Zoxyn->now rocephin  VDRF: Ventilator management per CCM.  Reintubated d/t inability to clear secretions.  Tentatively trach 1/25  Atrial fib Rate controlled.  On small dose carvedilol  H/O CAD  HTN - BP's remain soft.  Stopped  Amlodipine 1/22  Nutrition - getting high protein TF's - will likely contribute to azotemia...   Jamal Maes, MD [facility] [phone] Pager 06/16/2014, 8:45 AM

## 2014-06-16 NOTE — Progress Notes (Signed)
PULMONARY / CRITICAL CARE MEDICINE   Name: Barry Lawrence MRN: :[id number] DOB: 1946/01/06   LOS 17 days   ADMISSION DATE:  05/30/2014 CONSULTATION DATE:  05/30/2014  REFERRING MD :  [name]   CHIEF COMPLAINT:  Vent management   INITIAL PRESENTATION:  69 yo male smoker for repair of AAA.  Remained on vent post-op.  PMHx of CAD s/p CABG, CKD III, OSA on CPAP, DM, HTN, mild COPD.    SIGNIFICANT EVENTS: 1/07 OR>> elective repair AAA 1/10 transfuse 2 units PRBC, renal consulted 06/05/14 - 06/07/14 - s/p CRRT 06/08/14 - EXTUBATED 1/17 re- intubated urgently 1/18 bradycardia from precedex >> change to diprivan 1/19 Fever >> add Abx 06/13/14: Tolerating pressure support, but RR still high. 1/22 PSV 6 hours, CVL changed 1/23 PSV most of day, follows commands    SUBJECTIVE/OVERNIGHT/INTERVAL HX PSV most of day, follows commands   VITAL SIGNS: Temp:  [97.9 F (36.6 C)-100.2 F (37.9 C)] 100.2 F (37.9 C) (01/24 0700) Pulse Rate:  [61-85] 80 (01/24 0824) Resp:  [18-31] 30 (01/24 0824) BP: (81-131)/(41-72) 122/50 mmHg (01/24 0824) SpO2:  [97 %-100 %] 100 % (01/24 0824) FiO2 (%):  [40 %] 40 % (01/24 0824) Weight:  [92.4 kg (203 lb 11.3 oz)] 92.4 kg (203 lb 11.3 oz) (01/24 0600)   HEMODYNAMICS: CVP:  [9 mmHg-12 mmHg] 9 mmHg   VENTILATOR SETTINGS: Vent Mode:  [-] PSV FiO2 (%):  [40 %] 40 % Set Rate:  [18 bmp] 18 bmp Vt Set:  [530 mL] 530 mL PEEP:  [5 cmH20] 5 cmH20 Pressure Support:  [5 [id number] cmH20] 5 cmH20 Plateau Pressure:  [15 cmH20-20 cmH20] 20 cmH20   INTAKE / OUTPUT:  Intake/Output Summary (Last 24 hours) at 06/16/14 0829 Last data filed at 06/16/14 0700  Gross per 24 hour  Intake 2722.25 ml  Output   2980 ml  Net -257.75 ml   PHYSICAL EXAMINATION: Gen: sedated on vent HEENT: NCAT, ETT in place PULM rhonchi bilaterally CV: Irreg irreg, normal rage AB: midline scar well healed, BS+, soft Ext: warm Neuro: sedated on vent  LABS:  PULMONARY  Recent  Labs Lab 06/10/14 0403  PHART 7.387  PCO2ART 44.9  PO2ART 86.0  HCO3 27.0*  TCO2 28  O2SAT 96.0    CBC  Recent Labs Lab 06/14/14 0500 06/15/14 0350 06/16/14 0340  HGB 9.3* 8.6* 9.6*  HCT 29.0* 27.1* 29.7*  WBC 10.9* 8.5 7.6  PLT 324 292 289    COAGULATION No results for input(s): INR in the last 168 hours.  CARDIAC  No results for input(s): TROPONINI in the last 168 hours. No results for input(s): PROBNP in the last 168 hours.   CHEMISTRY  Recent Labs Lab 06/11/14 0350  06/12/14 0430 06/12/14 1605 06/13/14 0450 06/14/14 0500 06/15/14 0350 06/15/14 1100 06/16/14 0340  NA 143  < > 145 145 143 150* 145 142 143  K 2.9*  < > 3.0* 2.9* 3.0* 3.3* 2.8* 3.3* 2.9*  CL 102  < > 99 99 98 102 99 96 97  CO2 28  < > 32 31 32 34* 34* 30 33*  GLUCOSE 118*  < > 121* 110* 112* 121* 147* 160* 147*  BUN 103*  < > 111* 112* 113* 122* 124* 120* 125*  CREATININE 4.91*  < > 4.91* 5.00* 5.02* 5.18* 4.87* 4.75* 4.29*  CALCIUM 8.4  < > 8.4 8.3* 8.2* 8.2* 7.9* 8.0* 8.0*  MG 1.8  --  1.7  --   --   --   --   --   --  PHOS 5.4*  < > 5.6* 5.7* 5.7* 5.8* 5.5*  --  5.1*  < > = values in this interval not displayed. Estimated Creatinine Clearance: 17.9 mL/min (by C-G formula based on Cr of 4.29).   LIVER  Recent Labs Lab 06/13/14 0450 06/13/14 1203 06/14/14 0500 06/15/14 0350 06/16/14 0340  ALBUMIN 1.9* 1.9* 1.9* 1.8* 1.8*     INFECTIOUS  Recent Labs Lab 06/15/14 0350  LATICACIDVEN 0.6     ENDOCRINE CBG (last 3)   Recent Labs  06/15/14 1933 06/16/14 0014 06/16/14 0754  GLUCAP 113* 119* 128*         IMAGING x48h Dg Chest Port 1 View  06/15/2014   CLINICAL DATA:  Acute respiratory failure with hypoxia  EXAM: PORTABLE CHEST - 1 VIEW  COMPARISON:  Radiograph 06/14/2014  FINDINGS: Endotracheal tube 3.6 cm from carina. Interval removal of left IJ line. Right subclavian line with tip in the right atrium is unchanged. Left IJ line with tip in the distal SVC is  unchanged. Stable cardiac silhouette. Mild basilar atelectasis is present. Mild airspace disease bases suggesting edema.  IMPRESSION: 1. New right IJ line. 2. Left subclavian line remains with tip in the right atrium. 3. Mild basilar airspace disease suggesting edema.   Electronically Signed   By: Suzy Bouchard M.D.   On: 06/15/2014 09:09   Dg Chest Port 1 View  06/14/2014   CLINICAL DATA:  Central line placement  EXAM: PORTABLE CHEST - 1 VIEW  COMPARISON:  06/14/2014  FINDINGS: Cardiomediastinal silhouette is stable. Status post median sternotomy. Stable endotracheal and NG tube position. Stable bilateral IJ central line with tip in SVC right atrium junction. There is a new right subclavian central line with tip in right atrium. For distal SVC position should be retracted about 5 cm. No pneumothorax. Residual mild interstitial edema with improvement in aeration.  IMPRESSION: Stable bilateral IJ central line. Stable endotracheal and NG tube position. New right subclavian central line with tip in right atrium. No pneumothorax. Residual mild interstitial prominence with improvement in aeration.   Electronically Signed   By: Lahoma Crocker M.D.   On: 06/14/2014 17:07       ASSESSMENT / PLAN:  PULMONARY OETT 01/07 >>>1/16>>re intubated 1/17>> A: Hx of OSA, COPD Acute respiratory failure with hypoxia following surgery for AAA repair. Re-intubated 1/17 due to airway protection and WOB  Needs trach  P:   Recommend trach, wife considering, see note from Dr. Chase Caller 1/22 PSV as tolerated today Plan trach 1/25 per CCM   CARDIOVASCULAR R [place] 01/07 >> 1/09 Rt [place] CVL 1/22 >> Lt IJ HD cath 1/14 >> A:  Hypovolemic/hemorrhagic shock after AAA repair >> resolved A fib Hx of CAD s/p CABG, HTN, HLD, PAD P:  Continue coreg, lasix, norvasc Full dose lovenox >> coumadin if okay with surgery  RENAL A:   Acute on chronic CKD III >> baseline creatinine 1.8 to 2.1 Post op  ATN  in setting of  hypovolemic/hemorrhagic shock > s/p CRRT ending 06/08/14 Hypokalemia P:   Per renal Gave KCL 3 runs 1/24 AM, repeat BMET  GASTROINTESTINAL A:   Nutrition Hx of GERD P:   TF's running Protonix for SUP  HEMATOLOGIC A:  Acute blood loss anemia Thrombocytopenia >> resolved P: F/u CBC Transfuse for Hb < 7  INFECTIOUS A:   Fever 1/20 >> possible sources HCAP and Lt groin wound P:   Ceftriaxone for e.coli from wound > plan through 1/25 Wound care  Blood 1/19 >>  Wound 1/19 >> E coli  ENDOCRINE A:   No acute issues P:   SSI while on tube feeds  NEUROLOGIC A:   Acute metabolic encephalopathy P:   Diprivan, fentanyl (check ck and lactitc 06/15/14)  RASS goal: 0   Summary: Day 17 on vent.  He will benefit from trach/LTAC; plan trach 1/25  CC time 30 minutes  Roselie Awkward, MD [facility] Pager: [phone] Cell: [phone] If no response, call [phone]   06/16/2014 8:29 AM

## 2014-06-16 NOTE — Progress Notes (Signed)
   Daily Progress Note  Assessment/Planning: POD #17 s/p ABF, L RA bypass   Some vent wean progress  Defer to PCCM if ready to try extubate vs trach  Acceptable response to 1 u pRBC  Change tube feeds  Both groins unchanged: can probably VAC L groin, R groin probably needs some additional debridement (Santyl vs sharp debridement).  Will let Dr. [name] decide after seeing the wounds on Monday  Subjective  - 17 Days Post-Op  Non-responsive, reported improved vent weaning  Objective Filed Vitals:   06/16/14 0700 06/16/14 0800 06/16/14 0824 06/16/14 0900  BP: 81/70 122/50 122/50 139/63  Pulse: 74 69 80 83  Temp: 100.2 F (37.9 C)     TempSrc:      Resp: 18 15 30 28   Height:      Weight:      SpO2: 99% 100% 100% 99%    Intake/Output Summary (Last 24 hours) at 06/16/14 0936 Last data filed at 06/16/14 0900  Gross per 24 hour  Intake 2696.78 ml  Output   2830 ml  Net -133.22 ml    PULM  BLL rales, intubated, vent CV  RRR GI  soft, NTND VASC  Incision with some blanching erythema infraumbilical, R groin with desiccated fat (improved from yesterday), L groin clean with some granulation, no graft in either incision  Laboratory CBC    Component Value Date/Time   WBC 7.6 06/16/2014 0340   HGB 9.6* 06/16/2014 0340   HCT 29.7* 06/16/2014 0340   PLT 289 06/16/2014 0340    BMET    Component Value Date/Time   NA 143 06/16/2014 0340   K 2.9* 06/16/2014 0340   CL 97 06/16/2014 0340   CO2 33* 06/16/2014 0340   GLUCOSE 147* 06/16/2014 0340   BUN 125* 06/16/2014 0340   CREATININE 4.29* 06/16/2014 0340   CREATININE 2.02* 05/03/2014 0829   CALCIUM 8.0* 06/16/2014 0340   GFRNONAA 13* 06/16/2014 0340   GFRAA 15* 06/16/2014 [place], MD [facility]: [phone] Pager: [phone]  06/16/2014, 9:36 AM

## 2014-06-17 ENCOUNTER — Inpatient Hospital Stay (HOSPITAL_COMMUNITY): Payer: Medicare Other

## 2014-06-17 DIAGNOSIS — I4891 Unspecified atrial fibrillation: Secondary | ICD-10-CM

## 2014-06-17 LAB — GLUCOSE, CAPILLARY
Glucose-Capillary: 100 mg/dL — ABNORMAL HIGH (ref 70–99)
Glucose-Capillary: 109 mg/dL — ABNORMAL HIGH (ref 70–99)
Glucose-Capillary: 119 mg/dL — ABNORMAL HIGH (ref 70–99)
Glucose-Capillary: 119 mg/dL — ABNORMAL HIGH (ref 70–99)
Glucose-Capillary: 124 mg/dL — ABNORMAL HIGH (ref 70–99)
Glucose-Capillary: 131 mg/dL — ABNORMAL HIGH (ref 70–99)
Glucose-Capillary: 139 mg/dL — ABNORMAL HIGH (ref 70–99)
Glucose-Capillary: 172 mg/dL — ABNORMAL HIGH (ref 70–99)
Glucose-Capillary: 99 mg/dL (ref 70–99)

## 2014-06-17 LAB — CBC WITH DIFFERENTIAL/PLATELET
Basophils Absolute: 0.1 10*3/uL (ref 0.0–0.1)
Basophils Relative: 1 % (ref 0–1)
Eosinophils Absolute: 0.2 10*3/uL (ref 0.0–0.7)
Eosinophils Relative: 2 % (ref 0–5)
HCT: 28.3 % — ABNORMAL LOW (ref 39.0–52.0)
Hemoglobin: 9 g/dL — ABNORMAL LOW (ref 13.0–17.0)
Lymphocytes Relative: 14 % (ref 12–46)
Lymphs Abs: 1 10*3/uL (ref 0.7–4.0)
MCH: 29.3 pg (ref 26.0–34.0)
MCHC: 31.8 g/dL (ref 30.0–36.0)
MCV: 92.2 fL (ref 78.0–100.0)
Monocytes Absolute: 0.6 10*3/uL (ref 0.1–1.0)
Monocytes Relative: 8 % (ref 3–12)
Neutro Abs: 5.2 10*3/uL (ref 1.7–7.7)
Neutrophils Relative %: 75 % (ref 43–77)
Platelets: 284 10*3/uL (ref 150–400)
RBC: 3.07 MIL/uL — ABNORMAL LOW (ref 4.22–5.81)
RDW: 14.9 % (ref 11.5–15.5)
WBC: 7 10*3/uL (ref 4.0–10.5)

## 2014-06-17 LAB — COMPREHENSIVE METABOLIC PANEL
ALT: 33 U/L (ref 0–53)
AST: 37 U/L (ref 0–37)
Albumin: 1.9 g/dL — ABNORMAL LOW (ref 3.5–5.2)
Alkaline Phosphatase: 65 U/L (ref 39–117)
Anion gap: 11 (ref 5–15)
BUN: 118 mg/dL — ABNORMAL HIGH (ref 6–23)
CO2: 32 mmol/L (ref 19–32)
Calcium: 8.3 mg/dL — ABNORMAL LOW (ref 8.4–10.5)
Chloride: 101 mmol/L (ref 96–112)
Creatinine, Ser: 3.65 mg/dL — ABNORMAL HIGH (ref 0.50–1.35)
GFR calc Af Amer: 18 mL/min — ABNORMAL LOW (ref >90)
GFR calc non Af Amer: 16 mL/min — ABNORMAL LOW (ref >90)
Glucose, Bld: 144 mg/dL — ABNORMAL HIGH (ref 70–99)
Potassium: 3.5 mmol/L (ref 3.5–5.1)
Sodium: 144 mmol/L (ref 135–145)
Total Bilirubin: 0.9 mg/dL (ref 0.3–1.2)
Total Protein: 5.5 g/dL — ABNORMAL LOW (ref 6.0–8.3)

## 2014-06-17 LAB — RENAL FUNCTION PANEL
Albumin: 1.8 g/dL — ABNORMAL LOW (ref 3.5–5.2)
Anion gap: 10 (ref 5–15)
BUN: 116 mg/dL — ABNORMAL HIGH (ref 6–23)
CO2: 34 mmol/L — ABNORMAL HIGH (ref 19–32)
Calcium: 8.1 mg/dL — ABNORMAL LOW (ref 8.4–10.5)
Chloride: 102 mmol/L (ref 96–112)
Creatinine, Ser: 3.67 mg/dL — ABNORMAL HIGH (ref 0.50–1.35)
GFR calc Af Amer: 18 mL/min — ABNORMAL LOW (ref >90)
GFR calc non Af Amer: 16 mL/min — ABNORMAL LOW (ref >90)
Glucose, Bld: 133 mg/dL — ABNORMAL HIGH (ref 70–99)
Phosphorus: 4.7 mg/dL — ABNORMAL HIGH (ref 2.3–4.6)
Potassium: 3.2 mmol/L — ABNORMAL LOW (ref 3.5–5.1)
Sodium: 146 mmol/L — ABNORMAL HIGH (ref 135–145)

## 2014-06-17 LAB — PHOSPHORUS: Phosphorus: 4.8 mg/dL — ABNORMAL HIGH (ref 2.3–4.6)

## 2014-06-17 MED ORDER — SODIUM CHLORIDE 0.9 % IV SOLN
1020.0000 mg | Freq: Once | INTRAVENOUS | Status: AC
  Administered 2014-06-17: 1020 mg via INTRAVENOUS
  Filled 2014-06-17: qty 34

## 2014-06-17 MED ORDER — ETOMIDATE 2 MG/ML IV SOLN
40.0000 mg | Freq: Once | INTRAVENOUS | Status: DC
  Filled 2014-06-17: qty 20

## 2014-06-17 MED ORDER — MIDAZOLAM HCL 2 MG/2ML IJ SOLN: 4.0000 mg | Freq: Once | INTRAMUSCULAR | Status: DC

## 2014-06-17 MED ORDER — FENTANYL CITRATE 0.05 MG/ML IJ SOLN: 200.0000 ug | Freq: Once | INTRAMUSCULAR | Status: DC

## 2014-06-17 MED ORDER — POTASSIUM CHLORIDE 20 MEQ/15ML (10%) PO SOLN
40.0000 meq | Freq: Once | ORAL | Status: AC
  Administered 2014-06-17: 40 meq

## 2014-06-17 MED ORDER — VECURONIUM BROMIDE 10 MG IV SOLR: 10.0000 mg | Freq: Once | INTRAVENOUS | Status: DC

## 2014-06-17 NOTE — Progress Notes (Signed)
PULMONARY / CRITICAL CARE MEDICINE   Name: Barry Lawrence MRN: [place]:[id number] DOB: Oct 06, 1945   LOS 18 days   ADMISSION DATE:  05/30/2014 CONSULTATION DATE:  05/30/2014  REFERRING MD :  [name]   CHIEF COMPLAINT:  Vent management   INITIAL PRESENTATION:  69 yo male smoker for repair of AAA.  Remained on vent post-op.  PMHx of CAD s/p CABG, CKD III, OSA on CPAP, DM, HTN, mild COPD.    SIGNIFICANT EVENTS: 1/07 OR>> elective repair AAA 1/10 transfuse 2 units PRBC, renal consulted 1/13 CRRT initiated.  1/16 EXTUBATED 1/16 CRRT discontinued due to improving renal function and Uo 1/17 re- intubated urgently 1/18 bradycardia from precedex >> change to diprivan 1/19 Fever >> add Abx 06/13/14: Tolerating pressure support, but RR still high. 1/22 PSV 6 hours, CVL changed 1/23 PSV most of day, follows commands 1/25 Sister and brother in law updated. Advised to proceed with trach tube placement. They agree. Consent obtained. Trach tube placement scheduled for 1/26   SUBJECTIVE/OVERNIGHT/INTERVAL HX Not tolerating PSV. RASS -2. Intermittently agitated. Not F/C   VITAL SIGNS: Temp:  [99 F (37.2 C)-99.6 F (37.6 C)] 99 F (37.2 C) (01/25 0822) Pulse Rate:  [61-92] 62 (01/25 1400) Resp:  [15-30] 17 (01/25 1400) BP: (87-160)/(43-97) 111/70 mmHg (01/25 1400) SpO2:  [95 %-100 %] 97 % (01/25 1400) FiO2 (%):  [30 %-40 %] 30 % (01/25 1217) Weight:  [93 kg (205 lb 0.4 oz)] 93 kg (205 lb 0.4 oz) (01/25 0400)   HEMODYNAMICS: CVP:  [5 mmHg-14 mmHg] 14 mmHg   VENTILATOR SETTINGS: Vent Mode:  [-] PRVC FiO2 (%):  [30 %-40 %] 30 % Set Rate:  [18 bmp] 18 bmp Vt Set:  [530 mL] 530 mL PEEP:  [5 cmH20] 5 cmH20 Pressure Support:  [10 cmH20] 10 cmH20 Plateau Pressure:  [8 [id number] cmH20] 21 cmH20   INTAKE / OUTPUT:  Intake/Output Summary (Last 24 hours) at 06/17/14 1435 Last data filed at 06/17/14 1400  Gross per 24 hour  Intake 4227.04 ml  Output   2885 ml  Net 1342.04 ml   PHYSICAL  EXAMINATION: Gen: sedated on vent HEENT: NCAT, ETT in place PULM rhonchi bilaterally CV: Irreg irreg, normal rage AB: midline scar well healed, BS+, soft Ext: warm, bilateral groin surgical wounds open, clean Neuro: sedated on vent, MAEs  LABS: I have reviewed all of today's lab results. Relevant abnormalities are discussed in the A/P section  CXR: Mild edema pattern   ASSESSMENT / PLAN:  PULMONARY ETT 01/07 >>>1/16, 1/17>>  A: Hx of OSA, COPD Recurrent acute respiratory failure with hypoxia Pulm edema P:   Cont full vent support - settings reviewed and/or adjusted Cont vent bundle Daily SBT if/when meets criteria Plan trach tube placement 1/26  CARDIOVASCULAR R [place] 01/07 >> 1/09 Rt [place] CVL 1/22 >>  Lt IJ HD cath 1/14 >> out A:  Hypovolemic/hemorrhagic shock after AAA repair >> resolved New onset AF, rate marginally controlled Hx of CAD s/p CABG, HTN, HLD, PAD P:  Continue coreg, lasix, norvasc Hold LMWH 1/26 for trach tube placement   RENAL A:   AKI, nonoliguric - improving CKD III, baseline Cr 1.8 to 2.1 Post op  ATN  in setting of hypovolemic/hemorrhagic shock > s/p CRRT ending 06/08/14 Hypokalemia P:   Per renal Gave KCL 3 runs 1/24 AM, repeat BMET  GASTROINTESTINAL A:   Nutrition Hx of GERD P:   TF's running Protonix for SUP  HEMATOLOGIC A:  Acute blood loss anemia  Thrombocytopenia >> resolved P: F/u CBC Transfuse for Hb < 7  INFECTIOUS A:   Fever 1/20 >> possible sources HCAP and Lt groin wound P:   Ceftriaxone for e.coli from wound > plan through 1/25 Wound care  Blood 1/19 >> Wound 1/19 >> E coli  ENDOCRINE A:   No acute issues P:   SSI while on tube feeds  NEUROLOGIC A:   Acute metabolic encephalopathy P:   Cont Diprivan, fentanyl (check ck and lactitc 06/15/14)  RASS goal: 0    CC time 45 minutes  Merton Border, MD ; [facility] service Mobile [phone].  After 5:30 PM or weekends, call [phone]  06/17/2014 2:35  PM

## 2014-06-17 NOTE — Progress Notes (Signed)
Subjective: Interval History: has no complaint, entub, vent .  Objective: Vital signs in last 24 hours: Temp:  [99.2 F (37.3 C)-99.6 F (37.6 C)] 99.2 F (37.3 C) (01/25 0325) Pulse Rate:  [61-93] 66 (01/25 0700) Resp:  [15-37] 23 (01/25 0700) BP: (87-160)/(43-118) 105/58 mmHg (01/25 0700) SpO2:  [97 %-100 %] 100 % (01/25 0700) FiO2 (%):  [30 %-40 %] 30 % (01/25 0441) Weight:  [93 kg (205 lb 0.4 oz)] 93 kg (205 lb 0.4 oz) (01/25 0400) Weight change: 0.6 kg (1 lb 5.2 oz)  Intake/Output from previous day: 01/24 0701 - 01/25 0700 In: 3498.4 [I.V.:718.4; NG/GT:2730; IV Piggyback:50] Out: 2840 [Urine:2440; Stool:400] Intake/Output this shift:    General appearance: cooperative, mildly obese, pale and lethargic on vent, sedated but cooperative Resp: rhonchi bilaterally and wheezes bilaterally Cardio: S1, S2 normal and systolic murmur: holosystolic 2/6, blowing at apex GI: mod distension, pos bs, midline incision Extremities: edema 2-3+. and groin wounds with dressings  Lab Results:  Recent Labs  06/16/14 0340 06/17/14 0343  WBC 7.6 7.0  HGB 9.6* 9.0*  HCT 29.7* 28.3*  PLT 289 284   BMET:  Recent Labs  06/16/14 1600 06/17/14 0343  NA 141 146*  K 3.1* 3.2*  CL 99 102  CO2 31 34*  GLUCOSE 279* 133*  BUN 106* 116*  CREATININE 3.58* 3.67*  CALCIUM 7.2* 8.1*   No results for input(s): PTH in the last 72 hours. Iron Studies: No results for input(s): IRON, TIBC, TRANSFERRIN, FERRITIN in the last 72 hours.  Studies/Results: Dg Chest Port 1 View  06/15/2014   CLINICAL DATA:  Acute respiratory failure with hypoxia  EXAM: PORTABLE CHEST - 1 VIEW  COMPARISON:  Radiograph 06/14/2014  FINDINGS: Endotracheal tube 3.6 cm from carina. Interval removal of left IJ line. Right subclavian line with tip in the right atrium is unchanged. Left IJ line with tip in the distal SVC is unchanged. Stable cardiac silhouette. Mild basilar atelectasis is present. Mild airspace disease bases  suggesting edema.  IMPRESSION: 1. New right IJ line. 2. Left subclavian line remains with tip in the right atrium. 3. Mild basilar airspace disease suggesting edema.   Electronically Signed   By: Suzy Bouchard M.D.   On: 06/15/2014 09:09    I have reviewed the patient's current medications.  Assessment/Plan: 1 AKI/CKD  GFR plateau in 15-20 cc/min and ? If will change much with hx CKD.  Vol xs yet but will allow to equilibrate with good 02, marginal SNa.  K being repleted.  Acid/base ok.  Will diurese in future 2 COPD 3 VDRF per CCM ? Trach 4 ^ SNa stable 5 low K repleting 6 Malnutirtion on TF 7 PVD per VVS 8 Anemia give Fe 9 groin Wounds per VVS 10 ^ lipids 11 Afib NSR now 13 Bp will need to back off on Carvedilol if cont low P follow bp, hold diuresis, follow SNa, check PTH, give Fe, TF    LOS: 18 days   Swayzie Choate L 06/17/2014,7:48 AM

## 2014-06-17 NOTE — Progress Notes (Signed)
[facility] Progress Note Patient Name: DEQWAN EINCK DOB: [birth year] MRN: [place]:[id number]   Date of Service  06/17/2014  HPI/Events of Note  Hypokalemia  eICU Interventions  Potassium replaced     Intervention Category Intermediate Interventions: Electrolyte abnormality - evaluation and management  DETERDING,ELIZABETH 06/17/2014, 5:35 AM

## 2014-06-17 NOTE — Progress Notes (Signed)
[facility] Progress Note Patient Name: Barry Lawrence DOB: 26-Jan-1946 MRN: [id number]   Date of Service  06/17/2014  HPI/Events of Note    eICU Interventions  Restraints renewed     Intervention Category Evaluation Type: Other  Nicholas Trompeter S. 06/17/2014, 4:35 PM

## 2014-06-17 NOTE — Progress Notes (Signed)
UR Completed.  [phone]  

## 2014-06-17 NOTE — Progress Notes (Addendum)
   [facility]  Subjective  - Intubated moving all 4 extremities.   Objective 105/58 66 99.2 F (37.3 C) (Axillary) 23 100%  Intake/Output Summary (Last 24 hours) at 06/17/14 0723 Last data filed at 06/17/14 0700  Gross per 24 hour  Intake 3498.43 ml  Output   2840 ml  Net 658.43 ml    Left groin skin edges clean minimal yellow eschar in wound base no visible graft. Right groin moderate yellow eschar, no visible graft. Abdomin soft, incision healing well.  Tube feed in place. Heart irregular      Assessment/Planning: POD # 18 s/p ABF, L RA bypass Possible trach today We will address the groin wound care once Dr. [name] has examined him. Tube feeding in place Hypokalemia replaced Acute blood loss anemia.  He received 1 unit PRBC 06/15/2014, 5 previous units and 5 units of FFP intraoperative. WBC stable 7.0 Lovenox 120 mg SQ q 24 hours    Laurence Slate [facility] 06/17/2014 7:23 AM --  Laboratory Lab Results:  Recent Labs  06/16/14 0340 06/17/14 0343  WBC 7.6 7.0  HGB 9.6* 9.0*  HCT 29.7* 28.3*  PLT 289 284   BMET  Recent Labs  06/16/14 1600 06/17/14 0343  NA 141 146*  K 3.1* 3.2*  CL 99 102  CO2 31 34*  GLUCOSE 279* 133*  BUN 106* 116*  CREATININE 3.58* 3.67*  CALCIUM 7.2* 8.1*    COAG Lab Results  Component Value Date   INR 1.34 06/09/2014   INR 1.21 06/04/2014   INR 1.46 05/31/2014   No results found for: PTT    I agree with the above.  I have seen and examined the patient.  I will plan for surgical debridement at the bedside of his bilateral groin wounds tomorrow.  Annamarie Major

## 2014-06-18 ENCOUNTER — Encounter (HOSPITAL_COMMUNITY): Payer: BLUE CROSS/BLUE SHIELD

## 2014-06-18 LAB — GLUCOSE, CAPILLARY
Glucose-Capillary: 101 mg/dL — ABNORMAL HIGH (ref 70–99)
Glucose-Capillary: 109 mg/dL — ABNORMAL HIGH (ref 70–99)
Glucose-Capillary: 111 mg/dL — ABNORMAL HIGH (ref 70–99)
Glucose-Capillary: 119 mg/dL — ABNORMAL HIGH (ref 70–99)
Glucose-Capillary: 130 mg/dL — ABNORMAL HIGH (ref 70–99)
Glucose-Capillary: 98 mg/dL (ref 70–99)

## 2014-06-18 LAB — PARATHYROID HORMONE, INTACT (NO CA): PTH: 67 pg/mL — ABNORMAL HIGH (ref 15–65)

## 2014-06-18 LAB — CULTURE, BLOOD (ROUTINE X 2)
Culture: NO GROWTH
Culture: NO GROWTH

## 2014-06-18 LAB — RENAL FUNCTION PANEL
Albumin: 2 g/dL — ABNORMAL LOW (ref 3.5–5.2)
Anion gap: 13 (ref 5–15)
BUN: 112 mg/dL — ABNORMAL HIGH (ref 6–23)
CO2: 31 mmol/L (ref 19–32)
Calcium: 8.8 mg/dL (ref 8.4–10.5)
Chloride: 103 mmol/L (ref 96–112)
Creatinine, Ser: 3.5 mg/dL — ABNORMAL HIGH (ref 0.50–1.35)
GFR calc Af Amer: 19 mL/min — ABNORMAL LOW (ref >90)
GFR calc non Af Amer: 17 mL/min — ABNORMAL LOW (ref >90)
Glucose, Bld: 108 mg/dL — ABNORMAL HIGH (ref 70–99)
Phosphorus: 4.5 mg/dL (ref 2.3–4.6)
Potassium: 3.6 mmol/L (ref 3.5–5.1)
Sodium: 147 mmol/L — ABNORMAL HIGH (ref 135–145)

## 2014-06-18 LAB — CBC
HCT: 27.4 % — ABNORMAL LOW (ref 39.0–52.0)
Hemoglobin: 8.9 g/dL — ABNORMAL LOW (ref 13.0–17.0)
MCH: 30.3 pg (ref 26.0–34.0)
MCHC: 32.5 g/dL (ref 30.0–36.0)
MCV: 93.2 fL (ref 78.0–100.0)
Platelets: 262 10*3/uL (ref 150–400)
RBC: 2.94 MIL/uL — ABNORMAL LOW (ref 4.22–5.81)
RDW: 14.8 % (ref 11.5–15.5)
WBC: 6.4 10*3/uL (ref 4.0–10.5)

## 2014-06-18 LAB — APTT: aPTT: 44 seconds — ABNORMAL HIGH (ref 24–37)

## 2014-06-18 LAB — PROTIME-INR
INR: 1.21 (ref 0.00–1.49)
Prothrombin Time: 15.5 seconds — ABNORMAL HIGH (ref 11.6–15.2)

## 2014-06-18 LAB — TRIGLYCERIDES: Triglycerides: 256 mg/dL — ABNORMAL HIGH (ref <150)

## 2014-06-18 MED ORDER — FENTANYL CITRATE 0.05 MG/ML IJ SOLN
25.0000 ug | INTRAMUSCULAR | Status: DC | PRN
  Administered 2014-06-18: 50 ug via INTRAVENOUS
  Filled 2014-06-18: qty 2

## 2014-06-18 MED ORDER — POTASSIUM CHLORIDE 20 MEQ/15ML (10%) PO SOLN
40.0000 meq | Freq: Once | ORAL | Status: AC
  Administered 2014-06-18: 40 meq
  Filled 2014-06-18 (×2): qty 30

## 2014-06-18 MED ORDER — ONDANSETRON HCL 4 MG/2ML IJ SOLN
4.0000 mg | INTRAMUSCULAR | Status: DC | PRN
  Administered 2014-06-18 – 2014-06-25 (×2): 4 mg via INTRAVENOUS
  Filled 2014-06-18 (×2): qty 2

## 2014-06-18 MED ORDER — FUROSEMIDE 10 MG/ML IJ SOLN
40.0000 mg | Freq: Once | INTRAMUSCULAR | Status: AC
Start: 2014-06-18 — End: 2014-06-18
  Administered 2014-06-18: 40 mg via INTRAVENOUS
  Filled 2014-06-18: qty 4

## 2014-06-18 MED ORDER — CETYLPYRIDINIUM CHLORIDE 0.05 % MT LIQD
7.0000 mL | Freq: Two times a day (BID) | OROMUCOSAL | Status: DC
  Administered 2014-06-18 – 2014-06-19 (×4): 7 mL via OROMUCOSAL

## 2014-06-18 MED ORDER — ALBUTEROL SULFATE (2.5 MG/3ML) 0.083% IN NEBU
2.5000 mg | INHALATION_SOLUTION | Freq: Four times a day (QID) | RESPIRATORY_TRACT | Status: DC
  Administered 2014-06-18 – 2014-06-20 (×9): 2.5 mg via RESPIRATORY_TRACT
  Filled 2014-06-18 (×8): qty 3

## 2014-06-18 MED ORDER — GERHARDT'S BUTT CREAM
TOPICAL_CREAM | CUTANEOUS | Status: DC | PRN
  Administered 2014-06-18: 1 via TOPICAL
  Filled 2014-06-18 (×2): qty 1

## 2014-06-18 MED ORDER — POTASSIUM CHLORIDE 2 MEQ/ML IV SOLN
INTRAVENOUS | Status: DC
  Administered 2014-06-18 – 2014-06-20 (×3): via INTRAVENOUS
  Filled 2014-06-18 (×4): qty 1000

## 2014-06-18 NOTE — Progress Notes (Signed)
PULMONARY / CRITICAL CARE MEDICINE   Name: Barry Lawrence MRN: [place]:[id number] DOB: August 06, 1945   LOS 19 days   ADMISSION DATE:  05/30/2014 CONSULTATION DATE:  05/30/2014  REFERRING MD :  [name]   CHIEF COMPLAINT:  Vent management   INITIAL PRESENTATION:  69 yo male smoker for repair of AAA.  Remained on vent post-op.  PMHx of CAD s/p CABG, CKD III, OSA on CPAP, DM, HTN, mild COPD.    SIGNIFICANT EVENTS: 1/07 OR>> elective repair AAA 1/10 transfuse 2 units PRBC, renal consulted 1/13 CRRT initiated.  1/16 EXTUBATED 1/16 CRRT discontinued due to improving renal function and Uo 1/17 re- intubated urgently 1/18 bradycardia from precedex >> change to diprivan 1/19 Fever >> add Abx 06/13/14: Tolerating pressure support, but RR still high. 1/22 PSV 6 hours, CVL changed 1/23 PSV most of day, follows commands 1/25 Sister and brother in law updated. Advised to proceed with trach tube placement. They agree. Consent obtained. Trach tube placement scheduled for 1/26 1/26 passed SBT and extubated. Appeared to be tolerating initially. Trach tube canceled   SUBJECTIVE/OVERNIGHT/INTERVAL HX Strong cough. No distress post extubation. + F/C   VITAL SIGNS: Temp:  [97.4 F (36.3 C)-99.8 F (37.7 C)] 97.4 F (36.3 C) (01/26 1108) Pulse Rate:  [62-80] 78 (01/26 1200) Resp:  [10-31] 19 (01/26 1200) BP: (100-154)/(44-119) 154/76 mmHg (01/26 1200) SpO2:  [92 %-100 %] 99 % (01/26 1323) FiO2 (%):  [30 %] 30 % (01/26 0800) Weight:  [93.4 kg (205 lb 14.6 oz)] 93.4 kg (205 lb 14.6 oz) (01/26 0256)   HEMODYNAMICS:     VENTILATOR SETTINGS: Vent Mode:  [-] PSV;PRVC FiO2 (%):  [30 %] 30 % Set Rate:  [18 bmp] 18 bmp Vt Set:  [530 mL] 530 mL PEEP:  [5 cmH20] 5 cmH20 Pressure Support:  [5 cmH20] 5 cmH20 Plateau Pressure:  [19 cmH20-23 cmH20] 20 cmH20   INTAKE / OUTPUT:  Intake/Output Summary (Last 24 hours) at 06/18/14 1353 Last data filed at 06/18/14 1300  Gross per 24 hour  Intake 2188.65 ml   Output   2725 ml  Net -536.35 ml   PHYSICAL EXAMINATION: Gen: RASS 0 HEENT: NCAT, WNL PULM scattered rhonchi, no wheezes CV: Irreg irreg, rate controlled AB: midline scar well healed, BS+, soft Ext: warm, bilateral groin surgical wounds open, clean Neuro: sedated on vent, MAEs  LABS: I have reviewed all of today's lab results. Relevant abnormalities are discussed in the A/P section  CXR: NNF   ASSESSMENT / PLAN:  PULMONARY ETT 01/07 >>>1/16, 1/17>> 1/26 A: Hx of OSA, COPD Recurrent acute hypoxic respiratory failure Pulm edema P:   Monitor in ICU post extubation Supp O2 Cont nebulized BDs If requires re-intubation, will proceed with trach tube placement  CARDIOVASCULAR R Swan 01/07 >> 1/09 Rt  CVL 1/22 >>  Lt IJ HD cath 1/14 >> out A:  Hypovolemic/hemorrhagic shock, resolved New onset AF, rate controlled Hx of CAD s/p CABG, HTN, HLD, PAD P:  Cont current Rx Resume LMWH 1/27 if not re-intubated  RENAL A:   AKI, nonoliguric - improving CKD III, baseline Cr 1.8 to 2.1 Hypokalemia, resolved Hypernatremia P:   Monitor BMET intermittently Monitor I/Os Correct electrolytes as indicated  GASTROINTESTINAL A:   Nutrition Hx of GERD P:   TF's running Protonix for SUP  HEMATOLOGIC A:  Acute blood loss anemia, no further blood loss Thrombocytopenia, resolved P: DVT px: SQ heparin Monitor CBC intermittently Transfuse per usual ICU guidelines  INFECTIOUS A:  Surgical wound infection, L groin P:   All micro and abx reviewed  ENDOCRINE A:   H/O "borderline" DM P:   Cont sens scale SSI  NEUROLOGIC A:   Acute encephalopathy, improving P:   RASS goal: 0 Minimize sedative/analgesics post extubation   CC time 35 minutes  Merton Border, MD ; [facility] service Mobile [phone].  After 5:30 PM or weekends, call [phone]  06/18/2014 1:53 PM

## 2014-06-18 NOTE — Progress Notes (Addendum)
Subjective  -   No acute events   Physical Exam:  Abdomen is soft Remains intubated  Bilateral groin wounds with desquamation of the subcutaneous fat.  I used scissors to sharply debrided the top layer of the fat which was excised and removed.   There was good bleeding.  Debridement size of wound was 4x4 cm in bilateral groins. I did not debride outside the wound margins I did not get down to granulation tissue.    Type of instrument used? Depth of debridement/type of tissue removed? (e.g., skin, subcutaneous tissue, fascia, muscle, bone, other) Did the debridement extend outside or beyond the wound margins? Excisional vs. nonexcisional? Irrigation of wound (e.g, Versajet)? Location of wound? Document the size of the debridement.    Assessment/Plan:    Tracheostomy plan for today bilateral groin wounds worse sharply debrided at the bedside.  I do not think that the wound bed is ready for a fact, therefore I will useSantyl for the next couple of days before transitioning to a wound VAC.   [place] 06/18/2014 9:26 AM --  Filed Vitals:   06/18/14 0900  BP: 150/72  Pulse: 79  Temp:   Resp: 31    Intake/Output Summary (Last 24 hours) at 06/18/14 0926 Last data filed at 06/18/14 0900  Gross per 24 hour  Intake 2771.95 ml  Output   2300 ml  Net 471.95 ml     Laboratory CBC    Component Value Date/Time   WBC 6.4 06/18/2014 0412   HGB 8.9* 06/18/2014 0412   HCT 27.4* 06/18/2014 0412   PLT 262 06/18/2014 0412    BMET    Component Value Date/Time   NA 147* 06/18/2014 0412   K 3.6 06/18/2014 0412   CL 103 06/18/2014 0412   CO2 31 06/18/2014 0412   GLUCOSE 108* 06/18/2014 0412   BUN 112* 06/18/2014 0412   CREATININE 3.50* 06/18/2014 0412   CREATININE 2.02* 05/03/2014 0829   CALCIUM 8.8 06/18/2014 0412   GFRNONAA 17* 06/18/2014 0412   GFRAA 19* 06/18/2014 0412    COAG Lab Results  Component Value Date   INR 1.21 06/18/2014   INR 1.34  06/09/2014   INR 1.21 06/04/2014   No results found for: PTT  Antibiotics Anti-infectives    Start     Dose/Rate Route Frequency Ordered Stop   06/14/14 1100  cefTRIAXone (ROCEPHIN) 1 g in dextrose 5 % 50 mL IVPB - Premix     1 g100 mL/hr over 30 Minutes Intravenous Every 24 hours 06/14/14 1011     06/11/14 2200  vancomycin (VANCOCIN) IVPB 1000 mg/200 mL premix  Status:  Discontinued     1,000 mg200 mL/hr over 60 Minutes Intravenous Every 48 hours 06/11/14 2058 06/14/14 1008   06/11/14 2130  piperacillin-tazobactam (ZOSYN) IVPB 2.25 g  Status:  Discontinued     2.25 g100 mL/hr over 30 Minutes Intravenous 3 times per day 06/11/14 2058 06/14/14 1011   05/31/14 0100  cefUROXime (ZINACEF) 1.5 g in dextrose 5 % 50 mL IVPB     1.5 g100 mL/hr over 30 Minutes Intravenous Every 12 hours 05/30/14 1732 05/31/14 1235   05/30/14 1330  cefUROXime (ZINACEF) 1.5 g in dextrose 5 % 50 mL IVPB  Status:  Discontinued     1.5 g100 mL/hr over 30 Minutes Intravenous To Surgery 05/30/14 1326 05/30/14 1716   05/29/14 1455  cefUROXime (ZINACEF) 1.5 g in dextrose 5 % 50 mL IVPB     1.5  g100 mL/hr over 30 Minutes Intravenous 30 min pre-op 05/29/14 1455 05/30/14 1343       V. Leia Alf, M.D. [facility] [place] [facility] Office: [phone] Pager:  [phone]

## 2014-06-18 NOTE — Progress Notes (Signed)
Wasted 150 mls of Fentanyl down the sink with Loma Newton, RN.

## 2014-06-18 NOTE — Progress Notes (Signed)
Subjective: Interval History: has no complaint coop,but agitated and not coherent.  Objective: Vital signs in last 24 hours: Temp:  [98.1 F (36.7 C)-99.8 F (37.7 C)] 98.1 F (36.7 C) (01/26 0734) Pulse Rate:  [62-90] 73 (01/26 0800) Resp:  [10-24] 24 (01/26 0800) BP: (100-155)/(44-119) 151/66 mmHg (01/26 0800) SpO2:  [92 %-100 %] 97 % (01/26 0800) FiO2 (%):  [30 %] 30 % (01/26 0800) Weight:  [93.4 kg (205 lb 14.6 oz)] 93.4 kg (205 lb 14.6 oz) (01/26 0256) Weight change: 0.4 kg (14.1 oz)  Intake/Output from previous day: 01/25 0701 - 01/26 0700 In: 3215.2 [I.V.:785.2; NG/GT:2380; IV Piggyback:50] Out: 2245 [Urine:2095; Stool:150] Intake/Output this shift: Total I/O In: 28.5 [I.V.:28.5] Out: 140 [Urine:140]  General appearance: cooperative, slowed mentation and not coherent but obeys commands Resp: diminished breath sounds bilaterally and rales bibasilar Cardio: S1, S2 normal and systolic murmur: holosystolic 2/6, blowing at apex GI: pos bs, liver down 5 cm, mod distension, midline incision Extremities: edema 1+  Lab Results:  Recent Labs  06/17/14 0343 06/18/14 0412  WBC 7.0 6.4  HGB 9.0* 8.9*  HCT 28.3* 27.4*  PLT 284 262   BMET:  Recent Labs  06/17/14 0800 06/18/14 0412  NA 144 147*  K 3.5 3.6  CL 101 103  CO2 32 31  GLUCOSE 144* 108*  BUN 118* 112*  CREATININE 3.65* 3.50*  CALCIUM 8.3* 8.8    Recent Labs  06/17/14 0830  PTH 67*   Iron Studies: No results for input(s): IRON, TIBC, TRANSFERRIN, FERRITIN in the last 72 hours.  Studies/Results: Dg Chest Port 1 View  06/17/2014   CLINICAL DATA:  Respiratory distress  EXAM: PORTABLE CHEST - 1 VIEW  COMPARISON:  06/15/2014  FINDINGS: Left jugular central venous catheter removed. Tubular devices are otherwise stable. Past congestion is worse. Opacity at the left base is worse. Cardiomegaly. No pneumothorax.  IMPRESSION: Worsening vascular congestion and left basilar consolidation.   Electronically Signed    By: Maryclare Bean M.D.   On: 06/17/2014 07:53    I have reviewed the patient's current medications.  Assessment/Plan: 1  CKD/AKI has plateaued and may stay at this.  K/acid/base ok..  Mild vol xs but stable and equilibrating 2 low K ok 3 ^ SNa mild,cont free water, hold diuretics 4 Copd 5 Resp failure per CCM 6 PVD per VVS  7 TAA 8 ^ lipid 9 Malnutrition on TF 10CAD 11 Cartid dz P vent, trach, TF, free water.    LOS: 19 days   Neleh Muldoon L 06/18/2014,8:57 AM

## 2014-06-18 NOTE — Procedures (Signed)
Extubation Procedure Note  Patient Details:   Name: Barry Lawrence DOB: [birth year] MRN: [id number]   Pt extubated to 4L [place] per MD order, VS stable, pt has strong productive cough. Pt able to vocalize, no stridor noted. RT will continue to monitor.    Evaluation  O2 sats: stable throughout Complications: No apparent complications Patient did tolerate procedure well. Bilateral Breath Sounds: Diminished Suctioning: Airway Yes  Jetty Peeks 06/18/2014, 10:11 AM

## 2014-06-19 ENCOUNTER — Encounter (HOSPITAL_COMMUNITY): Payer: Self-pay | Admitting: Radiology

## 2014-06-19 ENCOUNTER — Inpatient Hospital Stay (HOSPITAL_COMMUNITY): Payer: Medicare Other

## 2014-06-19 LAB — BASIC METABOLIC PANEL
Anion gap: 6 (ref 5–15)
BUN: 98 mg/dL — ABNORMAL HIGH (ref 6–23)
CO2: 34 mmol/L — ABNORMAL HIGH (ref 19–32)
Calcium: 8.7 mg/dL (ref 8.4–10.5)
Chloride: 105 mmol/L (ref 96–112)
Creatinine, Ser: 3.22 mg/dL — ABNORMAL HIGH (ref 0.50–1.35)
GFR calc Af Amer: 21 mL/min — ABNORMAL LOW (ref >90)
GFR calc non Af Amer: 18 mL/min — ABNORMAL LOW (ref >90)
Glucose, Bld: 118 mg/dL — ABNORMAL HIGH (ref 70–99)
Potassium: 4.1 mmol/L (ref 3.5–5.1)
Sodium: 145 mmol/L (ref 135–145)

## 2014-06-19 LAB — POCT I-STAT 3, ART BLOOD GAS (G3+)
Acid-Base Excess: 4 mmol/L — ABNORMAL HIGH (ref 0.0–2.0)
Bicarbonate: 31.4 mEq/L — ABNORMAL HIGH (ref 20.0–24.0)
O2 Saturation: 94 %
Patient temperature: 98.6
TCO2: 33 mmol/L (ref 0–100)
pCO2 arterial: 60.7 mmHg (ref 35.0–45.0)
pH, Arterial: 7.322 — ABNORMAL LOW (ref 7.350–7.450)
pO2, Arterial: 78 mmHg — ABNORMAL LOW (ref 80.0–100.0)

## 2014-06-19 LAB — GLUCOSE, CAPILLARY
Glucose-Capillary: 106 mg/dL — ABNORMAL HIGH (ref 70–99)
Glucose-Capillary: 114 mg/dL — ABNORMAL HIGH (ref 70–99)
Glucose-Capillary: 123 mg/dL — ABNORMAL HIGH (ref 70–99)
Glucose-Capillary: 132 mg/dL — ABNORMAL HIGH (ref 70–99)

## 2014-06-19 LAB — CBC
HCT: 28.4 % — ABNORMAL LOW (ref 39.0–52.0)
Hemoglobin: 9.1 g/dL — ABNORMAL LOW (ref 13.0–17.0)
MCH: 29.7 pg (ref 26.0–34.0)
MCHC: 32 g/dL (ref 30.0–36.0)
MCV: 92.8 fL (ref 78.0–100.0)
Platelets: 287 10*3/uL (ref 150–400)
RBC: 3.06 MIL/uL — ABNORMAL LOW (ref 4.22–5.81)
RDW: 14.7 % (ref 11.5–15.5)
WBC: 8.9 10*3/uL (ref 4.0–10.5)

## 2014-06-19 MED ORDER — COLLAGENASE 250 UNIT/GM EX OINT
TOPICAL_OINTMENT | Freq: Every day | CUTANEOUS | Status: DC
Start: 2014-06-19 — End: 2014-06-28
  Administered 2014-06-20: 07:00:00 via TOPICAL
  Administered 2014-06-21: 1 via TOPICAL
  Administered 2014-06-22 – 2014-06-23 (×2): via TOPICAL
  Administered 2014-06-24: 2 via TOPICAL
  Administered 2014-06-25 – 2014-06-27 (×3): via TOPICAL
  Filled 2014-06-19 (×3): qty 30

## 2014-06-19 MED ORDER — DARBEPOETIN ALFA 100 MCG/0.5ML IJ SOSY
100.0000 ug | PREFILLED_SYRINGE | INTRAMUSCULAR | Status: DC
  Administered 2014-06-19: 100 ug via SUBCUTANEOUS
  Filled 2014-06-19 (×2): qty 0.5

## 2014-06-19 MED ORDER — IOHEXOL 300 MG/ML  SOLN
25.0000 mL | INTRAMUSCULAR | Status: AC
  Administered 2014-06-19 (×2): 25 mL via ORAL

## 2014-06-19 MED ORDER — PANTOPRAZOLE SODIUM 40 MG PO TBEC: 40.0000 mg | DELAYED_RELEASE_TABLET | Freq: Every day | ORAL | Status: DC

## 2014-06-19 MED ORDER — CALCITRIOL 0.25 MCG PO CAPS
0.2500 ug | ORAL_CAPSULE | Freq: Every day | ORAL | Status: DC
  Administered 2014-06-20 – 2014-06-23 (×4): 0.25 ug via ORAL
  Filled 2014-06-19 (×6): qty 1

## 2014-06-19 MED ORDER — ENOXAPARIN SODIUM 120 MG/0.8ML ~~LOC~~ SOLN
120.0000 mg | SUBCUTANEOUS | Status: DC
  Administered 2014-06-19 – 2014-06-28 (×10): 120 mg via SUBCUTANEOUS
  Filled 2014-06-19 (×10): qty 0.8

## 2014-06-19 MED ORDER — CARVEDILOL 6.25 MG PO TABS
6.2500 mg | ORAL_TABLET | Freq: Two times a day (BID) | ORAL | Status: DC
  Administered 2014-06-19: 6.25 mg via ORAL
  Filled 2014-06-19 (×3): qty 1

## 2014-06-19 NOTE — Progress Notes (Addendum)
   [facility] [place]  Subjective  - Extubated oriented to self, alert.   Objective 148/82 77 99.3 F (37.4 C) (Oral) 21 100%  Intake/Output Summary (Last 24 hours) at 06/19/14 1038 Last data filed at 06/19/14 0745  Gross per 24 hour  Intake 1058.33 ml  Output   3190 ml  Net -2131.67 ml    Groin dressing opened, but not changed.  Will change once santyl arrives. Abdomin soft, tender along incision only. Palpable PT pulses bilaterally NG tube still in plce Lungs CTA expiratory wheezes   Assessment/Planning: POD #20 ABF, L renal bypass.    Extubated 06/11/2014.  Plan for NG tube to be removed with speech consult and swallow study.  Prior to this we will order a CT abdomin and pelvis with oral contrast to investigate the free air on chest x ray .  His WBC is normal and he does not have a temp.   PT for mobility is ordered. Lovenox was stopped for possible trach.  I will restart it today.  Lovenox 120 mg SQ daily.   Laurence Slate [facility] 06/19/2014 10:38 AM --  Laboratory Lab Results:  Recent Labs  06/18/14 0412 06/19/14 0349  WBC 6.4 8.9  HGB 8.9* 9.1*  HCT 27.4* 28.4*  PLT 262 287   BMET  Recent Labs  06/18/14 0412 06/19/14 0349  NA 147* 145  K 3.6 4.1  CL 103 105  CO2 31 34*  GLUCOSE 108* 118*  BUN 112* 98*  CREATININE 3.50* 3.22*  CALCIUM 8.8 8.7    COAG Lab Results  Component Value Date   INR 1.21 06/18/2014   INR 1.34 06/09/2014   INR 1.21 06/04/2014   No results found for: PTT    I agree with the above.  I have seen and examined the patient.  He is wheezing, but has non-labored breathing after extubation yesterday.  His abdomen is appropriately tender and very soft.  CXR today suggests a small amount of free air.  This is outside of the time frame where I would relate this to post surgery, therefore, a CT of the abdomen and pelvis will be done.  Use his NG for PO contrast.  Then he can have the NG removed and  have ST perform swallow eval.  Annamarie Major

## 2014-06-19 NOTE — Progress Notes (Signed)
ANTICOAGULATION CONSULT NOTE - Follow Up Consult  Pharmacy Consult for Lovenox Indication: atrial fibrillation  Allergies  Allergen Reactions  . Niaspan [Niacin Er] Hives    Patient Measurements: Height: 5\' 7"  (170.2 cm) Weight: 196 lb 13.9 oz (89.3 kg) IBW/kg (Calculated) : 66.1  Vital Signs: Temp: 98.5 F (36.9 C) (01/27 1100) Temp Source: Axillary (01/27 1100) BP: 149/70 mmHg (01/27 1000) Pulse Rate: 77 (01/27 1000)  Labs:  Recent Labs  06/17/14 0343 06/17/14 0800 06/18/14 0412 06/19/14 0349  HGB 9.0*  --  8.9* 9.1*  HCT 28.3*  --  27.4* 28.4*  PLT 284  --  262 287  APTT  --   --  44*  --   LABPROT  --   --  15.5*  --   INR  --   --  1.21  --   CREATININE 3.67* 3.65* 3.50* 3.22*    Estimated Creatinine Clearance: 23.4 mL/min (by C-G formula based on Cr of 3.22).  Assessment: 69yom s/p aortobifemoral bypass graft/left renal artery bypass on 1/7 developed post-op afib. He has been on full dose lovenox for anticoagulation. Lovenox was on hold 1/26 for trach placement, but end up extubated, tolerating well so far. Lovenox restarted 1/27. Scr has been trending down, 3.22 today, but est. crcl is still < 30 ml/min. Hgb 9.1, low stable, plt wnl.  1/21 anti-Xa level = 0.4, below goal, increased dose to 120 mg q 24 hrs. 1/24 recheck anti-Xa level = 0.73, at goal.   Goal of Therapy:  Anti-Xa level 0.6-1 units/ml 4hrs after LMWH dose given Monitor platelets by anticoagulation protocol: Yes   Plan:  - Continue lovenox 120mg  sq q24 - recheck level later this week or prn as renal function recovers  - CBC q72h minimum   Maryanna Shape, PharmD, BCPS  Clinical Pharmacist  Pager: [phone]  06/19/2014 1:09 PM

## 2014-06-19 NOTE — Clinical Documentation Improvement (Signed)
Please clarify documentation in the medical record of "debridement."  Please document the below (4) key elements in a progress note: ? Type of instrument used? ? Depth of debridement/type of tissue removed? (e.g., skin, subcutaneous tissue, fascia, muscle, bone, other) ? Did the debridement extend outside or beyond the wound margins? ? Excisional vs. nonexcisional? ? Irrigation of wound (e.g, Versajet)? ? Location of wound? ? Document the size of the debridement.  Supporting Information: Per 06/14/14 MD progress note = Right groin with necrotic eschar sharply debrided at bedside similar to left, left groin granulating some no graft exposed at this point.    Thank You,  Serena Colonel ,RN Clinical Documentation Specialist:  [place] Information Management

## 2014-06-19 NOTE — Progress Notes (Signed)
PULMONARY / CRITICAL CARE MEDICINE   Name: Barry Lawrence MRN: [place]:[id number] DOB: 1945/11/19   LOS 20 days   ADMISSION DATE:  05/30/2014 CONSULTATION DATE:  05/30/2014  REFERRING MD :  [name]   CHIEF COMPLAINT:  Vent management   INITIAL PRESENTATION:  69 yo male smoker for repair of AAA.  Remained on vent post-op.  PMHx of CAD s/p CABG, CKD III, OSA on CPAP, DM, HTN, mild COPD.    SIGNIFICANT EVENTS: 1/07 OR>> elective repair AAA 1/10 transfuse 2 units PRBC, renal consulted 1/13 CRRT initiated.  1/16 EXTUBATED 1/16 CRRT discontinued due to improving renal function and Uo 1/17 re- intubated urgently 1/18 bradycardia from precedex >> change to diprivan 1/19 Fever >> add Abx 06/13/14: Tolerating pressure support, but RR still high. 1/22 PSV 6 hours, CVL changed 1/23 PSV most of day, follows commands 1/25 Sister and brother in law updated. Advised to proceed with trach tube placement. They agree. Consent obtained. Trach tube placement scheduled for 1/26 1/26 passed SBT and extubated. Appeared to be tolerating initially. Trach tube canceled 1/27 Tolerating extubation. SLP eval ordered. PT ordered  SUBJECTIVE/OVERNIGHT/INTERVAL HX No distress. Strong cough. + F/C. Mildly agitated. Presently NSR   VITAL SIGNS: Temp:  [98.1 F (36.7 C)-99.3 F (37.4 C)] 98.5 F (36.9 C) (01/27 1100) Pulse Rate:  [66-88] 77 (01/27 1000) Resp:  [15-29] 26 (01/27 1000) BP: (101-154)/(29-82) 149/70 mmHg (01/27 1000) SpO2:  [91 %-100 %] 96 % (01/27 1000) Weight:  [89.3 kg (196 lb 13.9 oz)] 89.3 kg (196 lb 13.9 oz) (01/27 0400)   HEMODYNAMICS:     VENTILATOR SETTINGS:     INTAKE / OUTPUT:  Intake/Output Summary (Last 24 hours) at 06/19/14 1338 Last data filed at 06/19/14 1100  Gross per 24 hour  Intake 1268.33 ml  Output   2715 ml  Net -1446.67 ml   PHYSICAL EXAMINATION: Gen: RASS 0 HEENT: NCAT, WNL PULM scattered rhonchi, no wheezes CV: RRR s M AB: midline scar well healed, BS+,  soft Ext: warm, bilateral groin surgical wounds open, clean Neuro: no focal deficits noted  LABS: I have reviewed all of today's lab results. Relevant abnormalities are discussed in the A/P section  CXR: improved aeration   ASSESSMENT / PLAN:  PULMONARY ETT 01/07 >>>1/16, 1/17>> 1/26 A: Hx of OSA, COPD Recurrent acute hypoxic respiratory failure Pulm edema P:   Cont to monitor in ICU  Cont supp O2 Cont nebulized BDs If requires re-intubation, will proceed with trach tube placement  CARDIOVASCULAR R Swan 01/07 >> 1/09 Rt [place] CVL 1/22 >>  Lt IJ HD cath 1/14 >> out A:  Hypovolemic/hemorrhagic shock, resolved New onset PAF/flutter Hx of CAD s/p CABG, HTN, HLD, PAD P:  Cont current Rx Resume LMWH   RENAL A:   AKI, nonoliguric - improving CKD III, baseline Cr 1.8 to 2.1 Hypokalemia, resolved Hypernatremia P:   Monitor BMET intermittently Monitor I/Os Correct electrolytes as indicated  GASTROINTESTINAL A:   Hx of GERD, chronic PPI use Suspected post extubation dysphagia P:   SUP: PO PPI DC NGT SLP swallow eval 1/27  HEMATOLOGIC A:  Acute blood loss anemia, no further blood loss Thrombocytopenia, resolved P: DVT px: SQ heparin Monitor CBC intermittently Transfuse per usual ICU guidelines  INFECTIOUS A:   Surgical wound infection, L groin P:   All micro and abx reviewed Cont ceftriaxone per VVS  ENDOCRINE A:   H/O "borderline" DM P:   Not requiring insulin coverage - SSI DC'd 1/27  NEUROLOGIC  A:   Acute encephalopathy, improving P:   RASS goal: 0 Minimize sedative/analgesics post extubation    Merton Border, MD ; [facility] service Mobile [phone].  After 5:30 PM or weekends, call [phone]  06/19/2014 1:38 PM

## 2014-06-19 NOTE — Evaluation (Signed)
Clinical/Bedside Swallow Evaluation Patient Details  Name: Barry Lawrence MRN: [id number] Date of Birth: January 26, 1946  Today's Date: 06/19/2014 Time: SLP Start Time (ACUTE ONLY): 1125 SLP Stop Time (ACUTE ONLY): 1147 SLP Time Calculation (min) (ACUTE ONLY): 22 min  Past Medical History:  Past Medical History  Diagnosis Date  . CAD (coronary artery disease)     myoview 04/21/11-normal, EF49%  . S/P CABG (coronary artery bypass graft) 1996  . Hyperlipemia   . Polycythemia     H/O  . GERD (gastroesophageal reflux disease)   . Tobacco abuse   . CKD (chronic kidney disease), stage III   . Myocardial infarction 1996  . OSA on CPAP     CPAP q night , CPAP is through [place], states doesn't remember when he had the last study  . Pneumonia     hosp. as a child with pneumonia, not since   . Diabetes     BORDERLINE  . Headache     uses [employer], states he is lessening what he is taking for prep. for surgery   . Arthritis     in his back  . Hypertension     stress test- done 04/2014, followed by Dr. Gwenlyn Found  . Carotid stenosis 01/15/08    right endarterectomy-Dr. Amedeo Plenty  . PAD (peripheral artery disease) 06/12/09    a. 11/22/07 PTA & stenting right external iliac artery;bilateral iliac & PTI and stenting 1997;right ICA =>50% reduction,right SFA >50%,left ICA at stent => 50% reduction,left EIA => 50% reduction,left ATA occluded, left SFA mid > 49% reduction;  03/2014 Angio: LRA 90, patent L Iliac stent and distal RCIA stent, large saccular AAA.  [name] Abdominal aortic aneurysm 01/28/10; 02/05/14    4.3x4.6cm;  now measuring 5.5 cm   Past Surgical History:  Past Surgical History  Procedure Laterality Date  . Carotid endarterectomy Right 01/15/08  . Lumbar disc surgery  1998  . Pv angio  11/22/2007    PTA/ stenting of right common iliac artery with a 10x48mm Smart stent and postdilated with a 7x65mmpowerflex balloon; high grade calcified stenosis of the RICA  . Colonoscopy N/A 07/09/2013    Procedure:  COLONOSCOPY;  Surgeon: Juanita Craver, MD;  Location: [facility] ENDOSCOPY;  Service: Endoscopy;  Laterality: N/A;  . Pv angio  04/01/14    AAA, Lt renal artery stenosis, patent iliacs  . Abdominal aortagram N/A 04/01/2014    Procedure: ABDOMINAL Maxcine Ham;  Surgeon: [name], MD;  Location: [facility] CATH LAB;  Service: Cardiovascular;  Laterality: N/A;  . Back surgery  1988    at [facility]  . Coronary artery bypass graft  1996    LIMA to LAD, sequential vein to OM1 and 2 as well as acure marginal branch and diag branch  . Aorta - bilateral femoral artery bypass graft N/A 05/30/2014    Procedure: AORTOBIFEMORAL BYPASS GRAFT;  Surgeon: Serafina Mitchell, MD;  Location: [place];  Service: Vascular;  Laterality: N/A;  . Aortic/renal bypass Left 05/30/2014    Procedure: LEFT RENAL ARTERY BYPASS;  Surgeon: Serafina Mitchell, MD;  Location: [facility] OR;  Service: Vascular;  Laterality: Left;   HPI:  69 yo male smoker for repair of AAA. Remained on vent post-op. PMHx of CAD s/p CABG, CKD III, OSA on CPAP, DM, HTN, mild COPD.  Intubated from 1/7 to 1/15.  Swallow eval 1/16 with recs for NPO and concerns for laryngeal deficit; reintubated 1/17-1/26.     Assessment / Plan / Recommendation Clinical Impression  Pt  presents with dysphagia consistent with findings from 1/16 clinical swallow evaluation, potentially exacerbated due to subsequent ETT from 1/17-1/26.  Decreased laryngeal closure, impaired  ventilatory/swallowing coordination, and decreased MS put pt at risk for aspiration of POs. Recommend proceeding with instrumental swallow study (FEES) to ascertain degree of dysphagia and determine safest diet.  Recommend D/Cing tube feeding immediately prior to study.      Aspiration Risk  Severe    Diet Recommendation Limited ice chips PRN after oral care;NPO   Medication Administration: Via alternative means    Other  Recommendations Recommended Consults: FEES Oral Care Recommendations: Oral care Q4 per protocol   Follow Up  Recommendations   (tba)    Frequency and Duration min 3x week  2 weeks     Swallow Study Prior Functional Status       General Date of Onset: 05/30/14 HPI: 69 yo male smoker for repair of AAA. Remained on vent post-op. PMHx of CAD s/p CABG, CKD III, OSA on CPAP, DM, HTN, mild COPD.  Intubated from 1/7 to 1/15.  Swallow eval 1/16 with recs for NPO and concerns for laryngeal deficit; reintubated 1/17-1/26.   Type of Study: Bedside swallow evaluation Previous Swallow Assessment: 06/08/14 Diet Prior to this Study: NPO;Panda Temperature Spikes Noted: No Respiratory Status: Nasal cannula History of Recent Intubation: Yes Length of Intubations (days):  (see hx) Date extubated: 06/18/14 Behavior/Cognition: Alert;Confused;Distractible;Requires cueing Oral Cavity - Dentition: Adequate natural dentition Self-Feeding Abilities: Needs assist Patient Positioning: Upright in bed Baseline Vocal Quality: Low vocal intensity;Hoarse Volitional Cough: Strong Volitional Swallow: Able to elicit    Oral/Motor/Sensory Function Overall Oral Motor/Sensory Function: Appears within functional limits for tasks assessed   Ice Chips Ice chips: Impaired Presentation: Spoon Pharyngeal Phase Impairments: Suspected delayed Swallow;Wet Vocal Quality;Throat Clearing - Delayed;Cough - Delayed   Thin Liquid Thin Liquid: Impaired Presentation: Spoon Pharyngeal  Phase Impairments: Suspected delayed Swallow;Wet Vocal Quality;Throat Clearing - Delayed;Cough - Delayed    Nectar Thick Nectar Thick Liquid: Not tested   Honey Thick Honey Thick Liquid: Not tested   Puree Puree: Not tested   Solid  Mariaclara Spear L. [place], [place] CCC/SLP Pager [phone]     Solid: Not tested       Juan Quam Laurice 06/19/2014,11:55 AM

## 2014-06-19 NOTE — Clinical Documentation Improvement (Signed)
Please clarify documentation in the medical record of "debridement."  Please document the below (4) key elements in a progress note: ? Type of instrument used? ? Depth of debridement/type of tissue removed? (e.g., skin, subcutaneous tissue, fascia, muscle, bone, other) ? Did the debridement extend outside or beyond the wound margins? ? Excisional vs. nonexcisional? ? Irrigation of wound (e.g, Versajet)? ? Location of wound? ? Document the size of the debridement.  Supporting Information:   Per 06/18/14 Bilateral groin wounds with desquamation of the subcutaneous fat. I sharply debrided the top layer of the fat and there was good bleeding. I did not get down to granulation tissue.     Thank You,  Serena Colonel ,RN Clinical Documentation Specialist:  [place] [employer]

## 2014-06-19 NOTE — Progress Notes (Signed)
Pt placed on BIPAP for increased WOB and decreasing SPO2. Pt started on 15 IPAP and 10 EPAP. Pt did not tolerated well. Placed on 10 IPAP and 5 EPAP with 40%. Pt tolerating well. SPO2 100%.

## 2014-06-19 NOTE — Progress Notes (Signed)
Critical ABG result reported to 2S RN.

## 2014-06-19 NOTE — Progress Notes (Signed)
Subjective: Interval History: has no complaint.  Objective: Vital signs in last 24 hours: Temp:  [97.4 F (36.3 C)-99.3 F (37.4 C)] 99.3 F (37.4 C) (01/27 0700) Pulse Rate:  [66-88] 76 (01/27 0700) Resp:  [15-31] 28 (01/27 0700) BP: (101-154)/(29-82) 148/82 mmHg (01/27 0700) SpO2:  [92 %-100 %] 98 % (01/27 0700) Weight:  [89.3 kg (196 lb 13.9 oz)] 89.3 kg (196 lb 13.9 oz) (01/27 0400) Weight change: -4.1 kg (-9 lb 0.6 oz)  Intake/Output from previous day: 01/26 0701 - 01/27 0700 In: 1202.4 [I.V.:1062.4; NG/GT:90; IV Piggyback:50] Out: [id number] [Urine:3470] Intake/Output this shift: Total I/O In: 30 [NG/GT:30] Out: 105 [Urine:105]  General appearance: pale, slowed mentation and obeys some commands and answers some questions, not totally coherent. Resp: diminished breath sounds bilaterally and rhonchi bilaterally Cardio: S1, S2 normal and systolic murmur: holosystolic 2/6, blowing at apex GI: midline incison, pos bs, soft, liver down 4 cm, mod distension Extremities: edema 1+, Groin wound with dressings  Lab Results:  Recent Labs  06/18/14 0412 06/19/14 0349  WBC 6.4 8.9  HGB 8.9* 9.1*  HCT 27.4* 28.4*  PLT 262 287   BMET:  Recent Labs  06/18/14 0412 06/19/14 0349  NA 147* 145  K 3.6 4.1  CL 103 105  CO2 31 34*  GLUCOSE 108* 118*  BUN 112* 98*  CREATININE 3.50* 3.22*  CALCIUM 8.8 8.7    Recent Labs  06/17/14 0830  PTH 67*   Iron Studies: No results for input(s): IRON, TIBC, TRANSFERRIN, FERRITIN in the last 72 hours.  Studies/Results: Dg Chest Port 1 View  06/19/2014   CLINICAL DATA:  Follow-up of respiratory failure  EXAM: PORTABLE CHEST - 1 VIEW  COMPARISON:  Portable chest x-ray of June 17, 2014  FINDINGS: The lungs are well expanded. The pulmonary interstitial markings remain increased but have improved. The cardiac silhouette is mildly enlarged though stable. The pulmonary vascularity remains prominent centrally with mild cephalization.  The  endotracheal tube has been removed. The right subclavian venous catheter tip projects over the distal third of the SVC. The esophagogastric tube tip projects below the inferior margin of the image. There are 6 intact sternal wires present from previous CABG.  A small amount of free air under the right hemidiaphragm is noted.  IMPRESSION: 1. Slight improvement in the appearance of the pulmonary interstitium consistent with resolving interstitial edema. The remaining support tubes and lines are in appropriate position. 2. There is a tiny crescentic lucency which may reflect free air under the right hemidiaphragm. An acute abdominal series or abdominal and pelvic CT scan would be useful. 3. These results were called by telephone at the time of interpretation on 06/19/2014 at 8:18 am to Lyla Son, RN,who verbally acknowledged these results.   Electronically Signed   By: Benjimin  [place]   On: 06/19/2014 08:20    I have reviewed the patient's current medications.  Assessment/Plan: 1 CKD4, and AKI nonoliguric and maybe a Lawrence better today.  Vol xs mild but diuresing.  K ok . Bicarb ^ with 1 dose Lasix and ? CO2 retention.   2 Anemia got Fe , give epo 3 HPTH  Vit D 4 PVD , wound infx per VVS 5 COPD off vent, f/u and tx per Pulm 6 CAD 7 Carotid dz P epo, Vit D, follow chem    LOS: 20 days   Barry Lawrence L 06/19/2014,8:28 AM

## 2014-06-20 DIAGNOSIS — J9601 Acute respiratory failure with hypoxia: Secondary | ICD-10-CM

## 2014-06-20 DIAGNOSIS — Z9889 Other specified postprocedural states: Secondary | ICD-10-CM

## 2014-06-20 DIAGNOSIS — N189 Chronic kidney disease, unspecified: Secondary | ICD-10-CM

## 2014-06-20 DIAGNOSIS — N179 Acute kidney failure, unspecified: Secondary | ICD-10-CM

## 2014-06-20 LAB — RENAL FUNCTION PANEL
Albumin: 2.3 g/dL — ABNORMAL LOW (ref 3.5–5.2)
Anion gap: 10 (ref 5–15)
BUN: 83 mg/dL — ABNORMAL HIGH (ref 6–23)
CO2: 31 mmol/L (ref 19–32)
Calcium: 9.3 mg/dL (ref 8.4–10.5)
Chloride: 107 mmol/L (ref 96–112)
Creatinine, Ser: 3.08 mg/dL — ABNORMAL HIGH (ref 0.50–1.35)
GFR calc Af Amer: 22 mL/min — ABNORMAL LOW (ref >90)
GFR calc non Af Amer: 19 mL/min — ABNORMAL LOW (ref >90)
Glucose, Bld: 127 mg/dL — ABNORMAL HIGH (ref 70–99)
Phosphorus: 5 mg/dL — ABNORMAL HIGH (ref 2.3–4.6)
Potassium: 4.8 mmol/L (ref 3.5–5.1)
Sodium: 148 mmol/L — ABNORMAL HIGH (ref 135–145)

## 2014-06-20 LAB — GLUCOSE, CAPILLARY
Glucose-Capillary: 116 mg/dL — ABNORMAL HIGH (ref 70–99)
Glucose-Capillary: 117 mg/dL — ABNORMAL HIGH (ref 70–99)
Glucose-Capillary: 124 mg/dL — ABNORMAL HIGH (ref 70–99)
Glucose-Capillary: 125 mg/dL — ABNORMAL HIGH (ref 70–99)

## 2014-06-20 LAB — CBC
HCT: 32.1 % — ABNORMAL LOW (ref 39.0–52.0)
Hemoglobin: 9.9 g/dL — ABNORMAL LOW (ref 13.0–17.0)
MCH: 29.2 pg (ref 26.0–34.0)
MCHC: 30.8 g/dL (ref 30.0–36.0)
MCV: 94.7 fL (ref 78.0–100.0)
Platelets: 335 10*3/uL (ref 150–400)
RBC: 3.39 MIL/uL — ABNORMAL LOW (ref 4.22–5.81)
RDW: 14.6 % (ref 11.5–15.5)
WBC: 11.8 10*3/uL — ABNORMAL HIGH (ref 4.0–10.5)

## 2014-06-20 MED ORDER — DEXTROSE 5 % IV SOLN
INTRAVENOUS | Status: DC
  Administered 2014-06-20 – 2014-06-21 (×2): via INTRAVENOUS

## 2014-06-20 MED ORDER — PANTOPRAZOLE SODIUM 40 MG PO PACK
40.0000 mg | PACK | Freq: Every day | ORAL | Status: DC
  Administered 2014-06-20: 40 mg
  Filled 2014-06-20 (×2): qty 20

## 2014-06-20 MED ORDER — IPRATROPIUM BROMIDE 0.02 % IN SOLN
0.5000 mg | Freq: Four times a day (QID) | RESPIRATORY_TRACT | Status: DC
  Administered 2014-06-20 – 2014-06-28 (×31): 0.5 mg via RESPIRATORY_TRACT
  Filled 2014-06-20 (×32): qty 2.5

## 2014-06-20 MED ORDER — LEVALBUTEROL HCL 0.63 MG/3ML IN NEBU
0.6300 mg | INHALATION_SOLUTION | Freq: Four times a day (QID) | RESPIRATORY_TRACT | Status: DC
Start: 2014-06-20 — End: 2014-06-20
  Administered 2014-06-20: 0.63 mg via RESPIRATORY_TRACT
  Filled 2014-06-20: qty 3

## 2014-06-20 MED ORDER — CHLORHEXIDINE GLUCONATE 0.12 % MT SOLN
15.0000 mL | Freq: Two times a day (BID) | OROMUCOSAL | Status: DC
  Administered 2014-06-20 – 2014-06-26 (×14): 15 mL via OROMUCOSAL
  Filled 2014-06-20 (×16): qty 15

## 2014-06-20 MED ORDER — LEVALBUTEROL HCL 0.63 MG/3ML IN NEBU
0.6300 mg | INHALATION_SOLUTION | RESPIRATORY_TRACT | Status: DC | PRN
  Administered 2014-06-23: 0.63 mg via RESPIRATORY_TRACT

## 2014-06-20 MED ORDER — FREE WATER
300.0000 mL | Freq: Four times a day (QID) | Status: DC
  Administered 2014-06-20: 300 mL

## 2014-06-20 MED ORDER — CARVEDILOL 12.5 MG PO TABS
12.5000 mg | ORAL_TABLET | Freq: Two times a day (BID) | ORAL | Status: DC
  Administered 2014-06-20 – 2014-06-28 (×16): 12.5 mg via ORAL
  Filled 2014-06-20 (×20): qty 1

## 2014-06-20 MED ORDER — BUDESONIDE 0.25 MG/2ML IN SUSP
0.2500 mg | Freq: Four times a day (QID) | RESPIRATORY_TRACT | Status: DC
  Administered 2014-06-20 – 2014-06-21 (×2): 0.25 mg via RESPIRATORY_TRACT
  Filled 2014-06-20 (×3): qty 2

## 2014-06-20 MED ORDER — CETYLPYRIDINIUM CHLORIDE 0.05 % MT LIQD
7.0000 mL | Freq: Two times a day (BID) | OROMUCOSAL | Status: DC
  Administered 2014-06-20 – 2014-06-24 (×10): 7 mL via OROMUCOSAL

## 2014-06-20 MED ORDER — LEVALBUTEROL HCL 0.63 MG/3ML IN NEBU
0.6300 mg | INHALATION_SOLUTION | Freq: Four times a day (QID) | RESPIRATORY_TRACT | Status: DC
  Administered 2014-06-20 – 2014-06-28 (×31): 0.63 mg via RESPIRATORY_TRACT
  Filled 2014-06-20 (×57): qty 3

## 2014-06-20 NOTE — Progress Notes (Signed)
Subjective: Interval History: has complaints thirsty.  Objective: Vital signs in last 24 hours: Temp:  [97.9 F (36.6 C)-98.8 F (37.1 C)] 98.3 F (36.8 C) (01/28 0755) Pulse Rate:  [74-105] 80 (01/28 0600) Resp:  [14-36] 22 (01/28 0600) BP: (116-194)/(50-103) 172/74 mmHg (01/28 0600) SpO2:  [85 %-100 %] 99 % (01/28 0600) FiO2 (%):  [40 %-55 %] 40 % (01/28 0400) Weight:  [89.5 kg (197 lb 5 oz)] 89.5 kg (197 lb 5 oz) (01/28 0500) Weight change: 0.2 kg (7.1 oz)  Intake/Output from previous day: 01/27 0701 - 01/28 0700 In: 2045 [I.V.:1150; NG/GT:845; IV Piggyback:50] Out: [id number] [Urine:1825] Intake/Output this shift:    General appearance: alert, cooperative and on BIPAP Resp: diminished breath sounds bilaterally and rhonchi bibasilar Cardio: S1, S2 normal and systolic murmur: holosystolic 2/6, blowing at apex GI: mild distension, pos bs, liver down 5 cm, incision midline Extremities: groin wounds dressed  Lab Results:  Recent Labs  06/19/14 0349 06/20/14 0500  WBC 8.9 11.8*  HGB 9.1* 9.9*  HCT 28.4* 32.1*  PLT 287 335   BMET:  Recent Labs  06/19/14 0349 06/20/14 0420  NA 145 148*  K 4.1 4.8  CL 105 107  CO2 34* 31  GLUCOSE 118* 127*  BUN 98* 83*  CREATININE 3.22* 3.08*  CALCIUM 8.7 9.3    Recent Labs  06/17/14 0830  PTH 67*   Iron Studies: No results for input(s): IRON, TIBC, TRANSFERRIN, FERRITIN in the last 72 hours.  Studies/Results: Ct Abdomen Pelvis Wo Contrast  06/19/2014   CLINICAL DATA:  Initial evaluation for abdominal swelling and possible intraperitoneal air seen on chest radiograph performed today, aortic aneurysm repair 05/30/14  EXAM: CT ABDOMEN AND PELVIS WITHOUT CONTRAST  TECHNIQUE: Multidetector CT imaging of the abdomen and pelvis was performed following the standard protocol without IV contrast.  COMPARISON:  06/19/2014 chest radiographs, 03/05/2014 CT scan  FINDINGS: Mild cardiac enlargement. Status post CABG. Small bilateral pleural  effusions with myeloma underlying mild atelectasis.  NG tube passes into the duodenum at the level of the descending and third portions of the duodenum. Liver is normal. There are multiple gallstones in total measuring 3.2 cm. Gallbladder is not abnormally distended. No significant pericholecystic inflammation.  Spleen is normal. Pancreas is normal. 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 Kidneys are normal.  There is calcification of the abdominal aorta. There is distal abdominal aortic aneurysm. Maximal diameter is 48 x 39 mm, notably smaller when compared to prior study.The patient is status post aortobifemoral bypass since the prior CT. There is inflammatory change in the retroperitoneum surrounding the aorta at the level of the bypass graft and extending inferiorly to below the bifurcation. Inflammatory change extends mildly around both perinephric spaces. There is a small volume of free fluid in the pelvis.  Bladder is normal except for small amount of air within it which is likely related to the observed Foley catheter.  Nonobstructive bowel gas pattern with oral contrast throughout the bowel. Appendix, large bowel, small bowel, and stomach are normal. No evidence of pneumoperitoneum or pneumatosis intestinalis.  IMPRESSION: 1. No evidence of pneumoperitoneum. 2. Inflammatory change surrounding the distal abdominal aorta extending into both perinephric spaces. Patient is status aortobifemoral bypass and aneurysm repair, and the findings may simply represent postoperative change. The integrity of the repair would require iv contrast to assess further. 3. Numerous nonacute findings as discussed above.   Electronically Signed   By: Skipper Cliche M.D.   On: 06/19/2014 17:46  Dg Chest Port 1 View  06/19/2014   CLINICAL DATA:  Follow-up of respiratory failure  EXAM: PORTABLE CHEST - 1 VIEW  COMPARISON:  Portable chest x-ray of June 17, 2014  FINDINGS: The lungs are well expanded. The pulmonary  interstitial markings remain increased but have improved. The cardiac silhouette is mildly enlarged though stable. The pulmonary vascularity remains prominent centrally with mild cephalization.  The endotracheal tube has been removed. The right subclavian venous catheter tip projects over the distal third of the SVC. The esophagogastric tube tip projects below the inferior margin of the image. There are 6 intact sternal wires present from previous CABG.  A small amount of free air under the right hemidiaphragm is noted.  IMPRESSION: 1. Slight improvement in the appearance of the pulmonary interstitium consistent with resolving interstitial edema. The remaining support tubes and lines are in appropriate position. 2. There is a tiny crescentic lucency which may reflect free air under the right hemidiaphragm. An acute abdominal series or abdominal and pelvic CT scan would be useful. 3. These results were called by telephone at the time of interpretation on 06/19/2014 at 8:18 am to Lyla Son, RN,who verbally acknowledged these results.   Electronically Signed   By: Daryle  [place]   On: 06/19/2014 08:20    I have reviewed the patient's current medications.  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 Cr trending down. K rising, stop iv K 2 ^SNa worse.  Will resume per tube, give ice chips only. Actually neg balance with insensibles.  Apears unable to concentrate his urine 3 PVD per VVS 4 COPD resp insuffic signif issue, Pulm following 5 CAD 6 Carotid dz 7 Anemia stable 8 ^ lipids 9 HPTH 10 HTN bp ^, ^ coreg will consider switching to amlodipine with his lungs P Epo, Vit D, free water, ^ coreg,    LOS: 21 days   Witten Certain L 06/20/2014,8:15 AM

## 2014-06-20 NOTE — Progress Notes (Addendum)
[facility]   Subjective  - BIPAP comfortable resting, O2 SAT 98 %.   Objective 172/74 80 98.8 F (37.1 C) (Axillary) 22 99%  Intake/Output Summary (Last 24 hours) at 06/20/14 0717 Last data filed at 06/20/14 0600  Gross per 24 hour  Intake   2045 ml  Output   1825 ml  Net    220 ml   CT abdomin/pelvis: IMPRESSION: 1. No evidence of pneumoperitoneum. 2. Inflammatory change surrounding the distal abdominal aorta extending into both perinephric spaces. Patient is status aortobifemoral bypass and aneurysm repair, and the findings may simply represent postoperative change. The integrity of the repair would require iv contrast to assess further. 3. Numerous nonacute findings as discussed above.   Palpable DP pulses bilateral Lungs bipap in place tolerating well this am SAT 98 % Abdomin soft NTTP Groins dressed with santyl this am by nursing.  Assessment/Planning: POD # 21 ABF , L renal bypass  Lungs patient SAT dropped to 80's and was placed on BIPAP Abdomin soft NG D/C'd Speech eval 06/19/14 dysphagia ice chips limited, NPO CTA negative for free air. Heart Irregularly irregular Lovenox 120 mg QD SQ Groins santyl dressing QD plan to change dressing in am spoke with Nurse about plan. Palpable distal pulses   Laurence Slate [facility] 06/20/2014 7:17 AM --  Laboratory Lab Results:  Recent Labs  06/19/14 0349 06/20/14 0500  WBC 8.9 11.8*  HGB 9.1* 9.9*  HCT 28.4* 32.1*  PLT 287 335   BMET  Recent Labs  06/19/14 0349 06/20/14 0420  NA 145 148*  K 4.1 4.8  CL 105 107  CO2 34* 31  GLUCOSE 118* 127*  BUN 98* 83*  CREATININE 3.22* 3.08*  CALCIUM 8.7 9.3    COAG Lab Results  Component Value Date   INR 1.21 06/18/2014   INR 1.34 06/09/2014   INR 1.21 06/04/2014   No results found for: PTT    I agree with the above.  I have seen and examined the patient.  He is breathing comfortable on [place].  Abdomen is soft CT scan  was negative for free air I removed his NG.  He will start dysphagia diet Groins dressed with santyl Cr continues to decrease  Wells Brabham

## 2014-06-20 NOTE — Progress Notes (Signed)
Rehab Admissions Coordinator Note:  Patient was screened by Cleatrice Burke for appropriateness for an Inpatient Acute Rehab Consult per PT recommendation. At this time, we are recommending Inpatient Rehab consult.  Cleatrice Burke 06/20/2014, 2:21 PM  I can be reached at [phone].

## 2014-06-20 NOTE — Evaluation (Signed)
Physical Therapy Evaluation Patient Details Name: Barry Lawrence MRN: [place]:[id number] DOB: Oct 14, 1945 Today's Date: 06/20/2014   History of Present Illness  Pt adm for AAA repair on 05/30/13. Post op complications include VDRF, renal failure with pt on CVVHD. Extubated 1/26. PMHx of CAD s/p CABG, CKD III, OSA on CPAP, DM, HTN, mild COPD.  Clinical Impression  Pt admitted with above diagnosis. Pt currently with functional limitations due to the deficits listed below (see PT Problem List).  Pt will benefit from skilled PT to increase their independence and safety with mobility to allow discharge to the venue listed below.  Pt doing remarkably well after being in bed x 3 weeks. Feel he would be good CIR candidate.     Follow Up Recommendations CIR    Equipment Recommendations  Other (comment) (to be determined)    Recommendations for Other Services Rehab consult;OT consult     Precautions / Restrictions Precautions Precautions: Fall Restrictions Weight Bearing Restrictions: No      Mobility  Bed Mobility Overal bed mobility: Needs Assistance Bed Mobility: Supine to Sit;Sit to Supine     Supine to sit: +2 for physical assistance;Mod assist Sit to supine: +2 for physical assistance;Mod assist   General bed mobility comments: Verbal cues for technique. Assist to bring legs off bed and elevate trunk into sitting. Assist to control descent of trunk and bring legs back up into bed.  Transfers Overall transfer level: Needs assistance Equipment used: Rolling walker (2 wheeled) Transfers: Sit to/from Stand Sit to Stand: +2 physical assistance;Mod assist         General transfer comment: assist to bring hips up and for balance. Pt stood 3-4 minutes with walker to be cleaned after BM.   Ambulation/Gait                Stairs            Wheelchair Mobility    Modified Rankin (Stroke Patients Only)       Balance Overall balance assessment: Needs  assistance Sitting-balance support: Feet supported;Bilateral upper extremity supported Sitting balance-Leahy Scale: Poor Sitting balance - Comments: Min A and upper extremity support for safety.   Standing balance support: Bilateral upper extremity supported Standing balance-Leahy Scale: Poor Standing balance comment: walker and 2 person min A to maintain standing x 3-4 minutes.                             Pertinent Vitals/Pain Pain Assessment: No/denies pain    Home Living Family/patient expects to be discharged to:: Private residence Living Arrangements: Spouse/significant other Available Help at Discharge: Family Type of Home: House Home Access: Stairs to enter   Technical brewer of Steps: 3-4 Home Layout: One level        Prior Function Level of Independence: Independent               Hand Dominance        Extremity/Trunk Assessment   Upper Extremity Assessment: Defer to OT evaluation           Lower Extremity Assessment: Generalized weakness         Communication   Communication: No difficulties  Cognition Arousal/Alertness: Awake/alert Behavior During Therapy: Impulsive Overall Cognitive Status: Impaired/Different from baseline Area of Impairment: Memory;Safety/judgement;Problem solving     Memory: Decreased recall of precautions   Safety/Judgement: Decreased awareness of safety;Decreased awareness of deficits   Problem Solving: Requires verbal cues;Requires tactile cues General  Comments: Pt required repeated cues to follow precautions for swallowing.    General Comments      Exercises        Assessment/Plan    PT Assessment Patient needs continued PT services  PT Diagnosis Difficulty walking;Generalized weakness   PT Problem List Decreased strength;Decreased activity tolerance;Decreased balance;Decreased mobility;Decreased cognition;Decreased knowledge of use of DME;Decreased safety awareness;Decreased knowledge  of precautions  PT Treatment Interventions DME instruction;Gait training;Functional mobility training;Therapeutic activities;Therapeutic exercise;Patient/family education;Cognitive remediation;Neuromuscular re-education;Balance training   PT Goals (Current goals can be found in the Care Plan section) Acute Rehab PT Goals Patient Stated Goal: Be able to run around PT Goal Formulation: With patient Time For Goal Achievement: 07/04/14 Potential to Achieve Goals: Good    Frequency Min 3X/week   Barriers to discharge        Co-evaluation               End of Session Equipment Utilized During Treatment: Oxygen Activity Tolerance: Patient tolerated treatment well Patient left: in bed;with call bell/phone within reach;with nursing/sitter in room Nurse Communication: Mobility status         Time: [id number] PT Time Calculation (min) (ACUTE ONLY): 16 min   Charges:   PT Evaluation $Initial PT Evaluation Tier I: 1 Procedure     PT G Codes:        Rozelia Catapano 07/06/14, 1:44 PM  [facility] PT [phone]

## 2014-06-20 NOTE — Procedures (Signed)
Objective Swallowing Evaluation: Fiberoptic Endoscopic Evaluation of Swallowing  Patient Details  Name: Barry Lawrence MRN: [place]:[id number] Date of Birth: 1945/06/20  Today's Date: 06/20/2014 Time: SLP Start Time (ACUTE ONLY): 0930-SLP Stop Time (ACUTE ONLY): 1003 SLP Time Calculation (min) (ACUTE ONLY): 33 min  Past Medical History:  Past Medical History  Diagnosis Date  . CAD (coronary artery disease)     myoview 04/21/11-normal, EF49%  . S/P CABG (coronary artery bypass graft) 1996  . Hyperlipemia   . Polycythemia     H/O  . GERD (gastroesophageal reflux disease)   . Tobacco abuse   . CKD (chronic kidney disease), stage III   . Myocardial infarction 1996  . OSA on CPAP     CPAP q night , CPAP is through [place], states doesn't remember when he had the last study  . Pneumonia     hosp. as a child with pneumonia, not since   . Diabetes     BORDERLINE  . Headache     uses [employer], states he is lessening what he is taking for prep. for surgery   . Arthritis     in his back  . Hypertension     stress test- done 04/2014, followed by Dr. Gwenlyn Found  . Carotid stenosis 01/15/08    right endarterectomy-Dr. Amedeo Plenty  . PAD (peripheral artery disease) 06/12/09    a. 11/22/07 PTA & stenting right external iliac artery;bilateral iliac & PTI and stenting 1997;right ICA =>50% reduction,right SFA >50%,left ICA at stent => 50% reduction,left EIA => 50% reduction,left ATA occluded, left SFA mid > 49% reduction;  03/2014 Angio: LRA 90, patent L Iliac stent and distal RCIA stent, large saccular AAA.  [name] Abdominal aortic aneurysm 01/28/10; 02/05/14    4.3x4.6cm;  now measuring 5.5 cm   Past Surgical History:  Past Surgical History  Procedure Laterality Date  . Carotid endarterectomy Right 01/15/08  . Lumbar disc surgery  1998  . Pv angio  11/22/2007    PTA/ stenting of right common iliac artery with a 10x43mm Smart stent and postdilated with a 7x38mmpowerflex balloon; high grade calcified stenosis of the RICA   . Colonoscopy N/A 07/09/2013    Procedure: COLONOSCOPY;  Surgeon: Juanita Craver, MD;  Location: [facility];  Service: Endoscopy;  Laterality: N/A;  . Pv angio  04/01/14    AAA, Lt renal artery stenosis, patent iliacs  . Abdominal aortagram N/A 04/01/2014    Procedure: ABDOMINAL Maxcine Ham;  Surgeon: [name], MD;  Location: [facility] CATH LAB;  Service: Cardiovascular;  Laterality: N/A;  . Back surgery  1988    at [facility]  . Coronary artery bypass graft  1996    LIMA to LAD, sequential vein to OM1 and 2 as well as acure marginal branch and diag branch  . Aorta - bilateral femoral artery bypass graft N/A 05/30/2014    Procedure: AORTOBIFEMORAL BYPASS GRAFT;  Surgeon: Serafina Mitchell, MD;  Location: [facility];  Service: Vascular;  Laterality: N/A;  . Aortic/renal bypass Left 05/30/2014    Procedure: LEFT RENAL ARTERY BYPASS;  Surgeon: Serafina Mitchell, MD;  Location: [facility] OR;  Service: Vascular;  Laterality: Left;   HPI:  HPI: 69 yo male smoker for repair of AAA. Remained on vent post-op. PMHx of CAD s/p CABG, CKD III, OSA on CPAP, DM, HTN, mild COPD.  Intubated from 1/7 to 1/15.  Swallow eval 1/16 with recs for NPO and concerns for laryngeal deficit; reintubated 1/17-1/26.    No Data Recorded  Assessment / Plan / Recommendation CHL IP CLINICAL IMPRESSIONS 06/20/2014  Dysphagia Diagnosis Mild pharyngeal phase dysphagia  Clinical impression Patient presents with a mild pharyngeal dysphagia characterized by generalized weakness of laryngeal/pharyngeal musculature resulting in residue post swallow. Suspect mild residuals, exacerbated to moderate with the presence of large bore NG tube as residuals are primarily right sided along location of tube. Spontaneous multiple swallows combined with liquids wash for solids helpful to clear residuals. Trace penetration and one episode of trace aspiration noted with thin liquids consumed via large, consecutive boluses, clearing with cues for throat clear. Otherwise, patient  protecting airway. Recommend initiation of diet with aspiration precautions. Impulsivity will increase aspiration risk, therefore recommend full supervision with pos at this time. SLP to f/u for diet tolerance, use of precautions, and potential to advance solids. Recommend d/c NG tube ASAP to increase safety and comfort with swallow.        CHL IP TREATMENT RECOMMENDATION 06/20/2014  Treatment Plan Recommendations Therapy as outlined in treatment plan below     CHL IP DIET RECOMMENDATION 06/20/2014  Diet Recommendations Dysphagia 2 (Fine chop);Thin liquid  Liquid Administration via Cup;Straw  Medication Administration Crushed with puree  Compensations Slow rate;Small sips/bites;Multiple dry swallows after each bite/sip  Postural Changes and/or Swallow Maneuvers Seated upright 90 degrees     CHL IP OTHER RECOMMENDATIONS 06/20/2014  Recommended Consults (None)  Oral Care Recommendations Oral care BID  Other Recommendations (None)     CHL IP FOLLOW UP RECOMMENDATIONS 06/20/2014  Follow up Recommendations Inpatient Rehab     CHL IP FREQUENCY AND DURATION 06/20/2014  Speech Therapy Frequency (ACUTE ONLY) min 2x/week  Treatment Duration 2 weeks         SLP Swallow Goals No flowsheet data found.  No flowsheet data found.    CHL IP REASON FOR REFERRAL 06/20/2014  Reason for Referral Objectively evaluate swallowing function     CHL IP ORAL PHASE 06/20/2014  Lips (None)  Tongue (None)  Mucous membranes (None)  Nutritional status (None)  Other (None)  Oxygen therapy (None)  Oral Phase WFL  Oral - Pudding Teaspoon (None)  Oral - Pudding Cup (None)  Oral - Honey Teaspoon (None)  Oral - Honey Cup (None)  Oral - Honey Syringe (None)  Oral - Nectar Teaspoon (None)  Oral - Nectar Cup (None)  Oral - Nectar Straw (None)  Oral - Nectar Syringe (None)  Oral - Ice Chips (None)  Oral - Thin Teaspoon (None)  Oral - Thin Cup (None)  Oral - Thin Straw (None)  Oral - Thin Syringe (None)  Oral  - Puree (None)  Oral - Mechanical Soft (None)  Oral - Regular (None)  Oral - Multi-consistency (None)  Oral - Pill (None)  Oral Phase - Comment (None)      CHL IP PHARYNGEAL PHASE 06/20/2014  Pharyngeal Phase Impaired  Pharyngeal - Pudding Teaspoon (None)  Penetration/Aspiration details (pudding teaspoon) (None)  Pharyngeal - Pudding Cup (None)  Penetration/Aspiration details (pudding cup) (None)  Pharyngeal - Honey Teaspoon (None)  Penetration/Aspiration details (honey teaspoon) (None)  Pharyngeal - Honey Cup (None)  Penetration/Aspiration details (honey cup) (None)  Pharyngeal - Honey Syringe (None)  Penetration/Aspiration details (honey syringe) (None)  Pharyngeal - Nectar Teaspoon Delayed swallow initiation;Premature spillage to valleculae;Pharyngeal residue - valleculae;Pharyngeal residue - pyriform sinuses;Pharyngeal residue - posterior pharnyx;Pharyngeal residue - cp segment  Penetration/Aspiration details (nectar teaspoon) (None)  Pharyngeal - Nectar Cup Delayed swallow initiation;Premature spillage to valleculae;Pharyngeal residue - valleculae;Pharyngeal residue - pyriform sinuses;Pharyngeal residue - posterior  pharnyx;Pharyngeal residue - cp segment  Penetration/Aspiration details (nectar cup) (None)  Pharyngeal - Nectar Straw (None)  Penetration/Aspiration details (nectar straw) (None)  Pharyngeal - Nectar Syringe (None)  Penetration/Aspiration details (nectar syringe) (None)  Pharyngeal - Ice Chips (None)  Penetration/Aspiration details (ice chips) (None)  Pharyngeal - Thin Teaspoon Delayed swallow initiation;Pharyngeal residue - valleculae;Pharyngeal residue - pyriform sinuses;Premature spillage to pyriform sinuses  Penetration/Aspiration details (thin teaspoon) (None)  Pharyngeal - Thin Cup Delayed swallow initiation;Premature spillage to valleculae;Pharyngeal residue - cp segment;Pharyngeal residue - pyriform sinuses  Penetration/Aspiration details (thin cup) (None)   Pharyngeal - Thin Straw Delayed swallow initiation;Premature spillage to valleculae;Pharyngeal residue - valleculae;Pharyngeal residue - pyriform sinuses;Pharyngeal residue - posterior pharnyx;Pharyngeal residue - cp segment;Penetration/Aspiration before swallow  Penetration/Aspiration details (thin straw) Material enters airway, remains ABOVE vocal cords and not ejected out;Material enters airway, passes BELOW cords without attempt by patient to eject out (silent aspiration)  Pharyngeal - Thin Syringe (None)  Penetration/Aspiration details (thin syringe') (None)  Pharyngeal - Puree Delayed swallow initiation;Premature spillage to valleculae;Pharyngeal residue - pyriform sinuses;Pharyngeal residue - cp segment  Penetration/Aspiration details (puree) (None)  Pharyngeal - Mechanical Soft Delayed swallow initiation;Premature spillage to valleculae;Pharyngeal residue - valleculae;Pharyngeal residue - pyriform sinuses;Pharyngeal residue - posterior pharnyx;Pharyngeal residue - cp segment  Penetration/Aspiration details (mechanical soft) (None)  Pharyngeal - Regular (None)  Penetration/Aspiration details (regular) (None)  Pharyngeal - Multi-consistency (None)  Penetration/Aspiration details (multi-consistency) (None)  Pharyngeal - Pill (None)  Penetration/Aspiration details (pill) (None)  Pharyngeal Comment (None)     CHL IP CERVICAL ESOPHAGEAL PHASE 06/20/2014  Cervical Esophageal Phase WFL  Pudding Teaspoon (None)  Pudding Cup (None)  Honey Teaspoon (None)  Honey Cup (None)  Honey Syringe (None)  Nectar Teaspoon (None)  Nectar Cup (None)  Nectar Straw (None)  Nectar Syringe (None)  Thin Teaspoon (None)  Thin Cup (None)  Thin Straw (None)  Thin Syringe (None)  Cervical Esophageal Comment (None)  Jannely Henthorn MA, CCC-SLP [phone]   No flowsheet data found.         Dorthea Maina Meryl 06/20/2014, 11:15 AM

## 2014-06-20 NOTE — Progress Notes (Signed)
Rehab Admissions Coordinator Note:  Patient was screened by Retta Diones for appropriateness for an Inpatient Acute Rehab Consult.  At this time, we are recommending Inpatient Rehab consult.  Jodell Cipro M 06/20/2014, 2:21 PM  I can be reached at [phone].

## 2014-06-20 NOTE — Progress Notes (Signed)
UR Completed.  [phone]  

## 2014-06-20 NOTE — Progress Notes (Signed)
PULMONARY / CRITICAL CARE MEDICINE   Name: Barry Lawrence MRN: [id number]:[id number] DOB: 12/06/1945   LOS 21 days   ADMISSION DATE:  05/30/2014 CONSULTATION DATE:  05/30/2014  REFERRING MD :  [name]   CHIEF COMPLAINT:  Vent management   INITIAL PRESENTATION:  69 yo male smoker for repair of AAA.  Remained on vent post-op.  PMHx of CAD s/p CABG, CKD III, OSA on CPAP, DM, HTN, mild COPD.   SIGNIFICANT EVENTS: 1/07 OR>> elective repair AAA 1/10 transfuse 2 units PRBC, renal consulted 1/13 CRRT initiated.  1/16 EXTUBATED 1/16 CRRT discontinued due to improving renal function and Uo 1/17 re- intubated urgently 1/18 bradycardia from precedex >> change to diprivan 1/19 Fever >> add Abx 06/13/14: Tolerating pressure support, but RR still high. 1/22 PSV 6 hours, CVL changed 1/23 PSV most of day, follows commands 1/25 Sister and brother in law updated. Advised to proceed with trach tube placement. They agree. Consent obtained. Trach tube placement scheduled for 1/26 1/26 passed SBT and extubated. Appeared to be tolerating initially. Trach tube canceled 1/27 Tolerating extubation. SLP eval ordered. PT ordered 1/27 CTAP: No significant acute findings 1/28 Intermittent BiPAP for respiratory distress.   SUBJECTIVE/OVERNIGHT/INTERVAL HX No distress this AM. Strong cough. + F/C. Mildly confused. Presently NSR   VITAL SIGNS: Temp:  [97.9 F (36.6 C)-98.9 F (37.2 C)] 98.9 F (37.2 C) (01/28 1149) Pulse Rate:  [72-105] 72 (01/28 1400) Resp:  [14-36] 30 (01/28 1400) BP: (144-194)/(53-103) 148/53 mmHg (01/28 1400) SpO2:  [85 %-100 %] 95 % (01/28 1400) FiO2 (%):  [40 %-55 %] 40 % (01/28 0800) Weight:  [89.5 kg (197 lb 5 oz)] 89.5 kg (197 lb 5 oz) (01/28 0500)   HEMODYNAMICS:     VENTILATOR SETTINGS: Vent Mode:  [-] BIPAP FiO2 (%):  [40 %-55 %] 40 % Set Rate:  [15 bmp] 15 bmp PEEP:  [5 cmH20] 5 cmH20   INTAKE / OUTPUT:  Intake/Output Summary (Last 24 hours) at 06/20/14 1435 Last data  filed at 06/20/14 1400  Gross per 24 hour  Intake 2263.33 ml  Output   1995 ml  Net 268.33 ml   PHYSICAL EXAMINATION: Gen: RASS 0 HEENT: NCAT, WNL PULM minimal wheezes CV: RRR s M AB: midline scar well healed, BS+, soft Ext: warm, bilateral groin surgical wounds open, clean Neuro: no focal deficits noted  LABS: I have reviewed all of today's lab results. Relevant abnormalities are discussed in the A/P section  CXR: NNF   ASSESSMENT / PLAN:  PULMONARY ETT 01/07 >>>1/16, 1/17>> 1/26 A: Hx of OSA, COPD Recurrent acute hypoxic respiratory failure Pulm edema, resolved Acute bronchospasm 1/27, improving 1/28 P:   Cont to monitor in ICU  Cont supp O2 Cont nebulized BDs Add nebulized steroids 1/28 If requires re-intubation, will proceed with trach tube placement  CARDIOVASCULAR R [place] 01/07 >> 1/09 Rt [place] CVL 1/22 >>  Lt IJ HD cath 1/14 >> out A:  Hypovolemic/hemorrhagic shock, resolved New onset PAF/flutter Hx of CAD s/p CABG, HTN, HLD, PAD P:  Cont current Rx Cont full dose LMWH   RENAL A:   AKI, nonoliguric - improving CKD III, baseline Cr 1.8 to 2.1 Hypokalemia, resolved Hypernatremia P:   Monitor BMET intermittently Monitor I/Os Correct electrolytes as indicated D5W ordered 1/28  GASTROINTESTINAL A:   Hx of GERD, chronic PPI use Suspected post extubation dysphagia P:   SUP: PO PPI DC NGT DII diet per SLP  HEMATOLOGIC A:  Acute blood loss anemia, no further  blood loss Thrombocytopenia, resolved P: DVT px: full LMWH for PAF Monitor CBC intermittently Transfuse per usual ICU guidelines  INFECTIOUS A:   Surgical wound infection, L groin P:   All micro and abx reviewed Cont ceftriaxone per VVS  ENDOCRINE A:   H/O "borderline" DM P:   Monitor glu on chem panels Consider resumption of SSI for glu > 180  NEUROLOGIC A:   Acute encephalopathy, improving P:   RASS goal: 0 Minimize sedative/analgesics post extubation    Merton Border, MD ; [facility] service Mobile [phone].  After 5:30 PM or weekends, call [phone]  06/20/2014 2:35 PM

## 2014-06-20 NOTE — Progress Notes (Signed)
NUTRITION FOLLOW UP  Intervention:   Provide Magic cup TID between meals, each supplement provides 290 kcal and 9 grams of protein.  Encourage adequate PO intake  Nutrition Dx:   Inadequate oral intake related to inability to eat as evidenced by NPO status, just advanced to dysphagia 2 diet;ongoing.  Goal:   Pt to meet >/= 90% of their estimated nutrition needs; not met  Monitor:   PO intake, weight trend, labs, I/O's  Assessment:   69 yo male active smoker with extensive PMH including hx CAD s/p CABG, CKD III, OSA on CPAP, DM, HTN, COPD, stable L apex 59m nodule (followed by wert) presented 1/7 for elective repair of 5.6cm AAA. Remains intubated post op.   Pt extubated 1/26. Pt is currently on a dysphagia 2 diet. Pt reports having a decreased appetite. He reports meal completion of 50%. RD offered oral supplements to aid in caloric and protein needs, however pt refused. Pt did accept magic cups. RD to order. Pt was encouraged to eat his food at meals.   Labs: Low GFR. High BUN, sodium, phosphorous (5.0).  Height: Ht Readings from Last 1 Encounters:  05/30/14 5' 7"  (1.702 m)    Weight Status:   Wt Readings from Last 1 Encounters:  06/20/14 197 lb 5 oz (89.5 kg)   05/30/14 197 lb 9 oz (89.614 kg)   Body mass index is 30.9 kg/(m^2). Class I obesity  Re-estimated needs:  Kcal: 2000-2200 Protein: 110-125 gm Fluid: >/= 2 L  Skin: closed abdominal incision, closed right and left groin incisions, + 1 generalized edema  Diet Order: DIET DYS 2   Intake/Output Summary (Last 24 hours) at 06/20/14 1445 Last data filed at 06/20/14 1400  Gross per 24 hour  Intake 2263.33 ml  Output   1995 ml  Net 268.33 ml    Last BM: 1/28   Labs:   Recent Labs Lab 06/17/14 0800 06/18/14 0412 06/19/14 0349 06/20/14 0420  NA 144 147* 145 148*  K 3.5 3.6 4.1 4.8  CL 101 103 105 107  CO2 32 31 34* 31  BUN 118* 112* 98* 83*  CREATININE 3.65* 3.50* 3.22* 3.08*  CALCIUM 8.3*  8.8 8.7 9.3  PHOS 4.8* 4.5  --  5.0*  GLUCOSE 144* 108* 118* 127*    CBG (last 3)   Recent Labs  06/19/14 2342 06/20/14 0753 06/20/14 1146  GLUCAP 114* 124* 125*    Scheduled Meds: . antiseptic oral rinse  7 mL Mouth Rinse q12n4p  . budesonide  0.25 mg Nebulization 4 times per day  . calcitRIOL  0.25 mcg Oral Daily  . carvedilol  12.5 mg Oral BID WC  . cefTRIAXone (ROCEPHIN)  IV  1 g Intravenous Q24H  . chlorhexidine  15 mL Mouth Rinse BID  . collagenase   Topical Daily  . darbepoetin (ARANESP) injection - NON-DIALYSIS  100 mcg Subcutaneous Q Wed-1800  . enoxaparin (LOVENOX) injection  120 mg Subcutaneous Q24H  . ipratropium  0.5 mg Nebulization Q6H  . levalbuterol  0.63 mg Nebulization Q6H  . pantoprazole sodium  40 mg Per Tube Daily    Continuous Infusions: . sodium chloride Stopped (06/20/14 1044)  . dextrose 50 mL/hr at 06/20/14 [address] [place] # [phone] hours/ weekend pager # [phone]

## 2014-06-21 LAB — CBC
HCT: 32.2 % — ABNORMAL LOW (ref 39.0–52.0)
Hemoglobin: 10.1 g/dL — ABNORMAL LOW (ref 13.0–17.0)
MCH: 29.6 pg (ref 26.0–34.0)
MCHC: 31.4 g/dL (ref 30.0–36.0)
MCV: 94.4 fL (ref 78.0–100.0)
Platelets: 316 10*3/uL (ref 150–400)
RBC: 3.41 MIL/uL — ABNORMAL LOW (ref 4.22–5.81)
RDW: 14.8 % (ref 11.5–15.5)
WBC: 12.9 10*3/uL — ABNORMAL HIGH (ref 4.0–10.5)

## 2014-06-21 LAB — GLUCOSE, CAPILLARY
Glucose-Capillary: 101 mg/dL — ABNORMAL HIGH (ref 70–99)
Glucose-Capillary: 100 mg/dL — ABNORMAL HIGH (ref 70–99)

## 2014-06-21 LAB — RENAL FUNCTION PANEL
Albumin: 2.3 g/dL — ABNORMAL LOW (ref 3.5–5.2)
Anion gap: 11 (ref 5–15)
BUN: 73 mg/dL — ABNORMAL HIGH (ref 6–23)
CO2: 31 mmol/L (ref 19–32)
Calcium: 9.4 mg/dL (ref 8.4–10.5)
Chloride: 102 mmol/L (ref 96–112)
Creatinine, Ser: 2.74 mg/dL — ABNORMAL HIGH (ref 0.50–1.35)
GFR calc Af Amer: 26 mL/min — ABNORMAL LOW (ref >90)
GFR calc non Af Amer: 22 mL/min — ABNORMAL LOW (ref >90)
Glucose, Bld: 120 mg/dL — ABNORMAL HIGH (ref 70–99)
Phosphorus: 5.3 mg/dL — ABNORMAL HIGH (ref 2.3–4.6)
Potassium: 4.3 mmol/L (ref 3.5–5.1)
Sodium: 144 mmol/L (ref 135–145)

## 2014-06-21 LAB — HEPARIN ANTI-XA: Heparin LMW: 0.6 IU/mL

## 2014-06-21 MED ORDER — SODIUM CHLORIDE 0.9 % IJ SOLN
10.0000 mL | Freq: Two times a day (BID) | INTRAMUSCULAR | Status: DC
  Administered 2014-06-21: 10 mL
  Administered 2014-06-22: 30 mL
  Administered 2014-06-22 – 2014-06-23 (×2): 10 mL
  Administered 2014-06-23: 30 mL
  Administered 2014-06-24 – 2014-06-27 (×6): 10 mL

## 2014-06-21 MED ORDER — FUROSEMIDE 80 MG PO TABS
80.0000 mg | ORAL_TABLET | Freq: Once | ORAL | Status: AC
  Administered 2014-06-21: 80 mg via ORAL
  Filled 2014-06-21: qty 1
  Filled 2014-06-21: qty 2

## 2014-06-21 MED ORDER — PANTOPRAZOLE SODIUM 40 MG PO TBEC
40.0000 mg | DELAYED_RELEASE_TABLET | Freq: Every day | ORAL | Status: DC
  Administered 2014-06-21 – 2014-06-28 (×8): 40 mg via ORAL
  Filled 2014-06-21 (×8): qty 1

## 2014-06-21 MED ORDER — SODIUM CHLORIDE 0.9 % IJ SOLN
10.0000 mL | INTRAMUSCULAR | Status: DC | PRN
  Administered 2014-06-21: 20 mL
  Administered 2014-06-28: 10 mL
  Filled 2014-06-21 (×2): qty 40

## 2014-06-21 MED ORDER — BUDESONIDE 0.25 MG/2ML IN SUSP
0.2500 mg | Freq: Four times a day (QID) | RESPIRATORY_TRACT | Status: DC
Start: 2014-06-21 — End: 2014-06-25
  Administered 2014-06-21 – 2014-06-25 (×14): 0.25 mg via RESPIRATORY_TRACT
  Filled 2014-06-21 (×21): qty 2

## 2014-06-21 NOTE — Care Management Note (Signed)
    Page 1 of 2   06/28/2014     3:40:57 PM CARE MANAGEMENT NOTE 06/28/2014  Patient:  GILROY, MCKELLIPS   Account Number:  [id number]  Date Initiated:  06/03/2014  Documentation initiated by:  [facility]  Subjective/Objective Assessment:   Admitted post op  Aorta bifem - on vent and pressors.     Action/Plan:   Anticipated DC Date:  06/28/2014   Anticipated DC Plan:  [place]  In-house referral  Clinical Social Worker      DC Planning Services  CM consult      [facility] Choice  HOME HEALTH   Choice offered to / List presented to:  C-3 Spouse        HH arranged  HH-1 RN  [place].   Status of service:  Completed, signed off Medicare Important Message given?  YES (If response is "NO", the following Medicare IM given date fields will be blank) Date Medicare IM given:  06/26/2014 Medicare IM given by:  Marvetta Gibbons Date Additional Medicare IM given:   Additional Medicare IM given by:    Discharge Disposition:  [place]  Per UR Regulation:  Reviewed for med. necessity/level of care/duration of stay  If discussed at [place] of Stay Meetings, dates discussed:   06/04/2014  06/06/2014  06/11/2014  06/13/2014  06/18/2014  06/27/2014    Comments:  Contact:  Morini,Deloris J Spouse [phone]  06/28/14- 1400- Marvetta Gibbons RN, BSN [phone] Pt for d/c home today, does not want STSNF- want to return home with [facility] services- orders for RN/PT/OT- in to speak with pt and wife- list provided for [facility]- per choice they would like to use [facility] for services- referral called to Katie with [facility] for HH-RN/PT/OT- per pt he has needed DME- that includes walker, elevated toliet seat and if needed shower chair.  06/26/14- 1000- Marvetta Gibbons RN, BSN [phone] CIR consulted- per note in epic- pt too high function- they will not be considering admission- spoke with pt at bedside-pt states  that his family will be able to assist him at discharge- wife/daughter/son- per PT recommendation for STSNF prior to going home- pt did not do as well today with PT as he did over weekend- pt wants to go home but did state to CM that he might need rehab first- will have CSW speak with pt/family regarding potential STSNF.  06-21-14 4pm Luz Lex, RNBSN - [id number] [place] extubated - sitting up in chair. Wife at bedside. Family would like rehab - agreeable to in house - CIR if patient can tolerate.  Will need in house rehab order. From there depending on how he is doing and on back up incase cannot do CIR, agreeable to SNF in [place].  Do not want [facility] - first choice would be [facility].  SW referral placed.  06-18-14 [place], RNBSN [id number] [phone] doing better today - Extubated.  06-17-14 [place], RNBSN [phone] Plan for trach today.  R groin may need debridement.  Noted where PCCM physician suggest [place] post trach.  06-05-14 2pm Luz Lex, RNBSN [phone] Talked with wife at bedside - states independent prior to this.  Plan to place on CRRT today.  Remains on vent.

## 2014-06-21 NOTE — Progress Notes (Signed)
SLP Cancellation Note  Patient Details Name: Barry Lawrence MRN: [id number] DOB: 04-08-1946   Cancelled treatment:       Reason Eval/Treat Not Completed: Attempted to see pt multiple times today, but unavailable due to personal care needs, respiratory treatment, and ultimately learning his sister had just passed away.  If pt is not tolerating POs over the w/e, please make NPO and contact SLP services at [phone].  Thanks, Juan Quam Laurice 06/21/2014, 2:17 PM

## 2014-06-21 NOTE — Progress Notes (Signed)
PULMONARY / CRITICAL CARE MEDICINE   Name: Barry Lawrence MRN: [place]:[id number] DOB: 01-21-1946   LOS 22 days   ADMISSION DATE:  05/30/2014 CONSULTATION DATE:  05/30/2014  REFERRING MD :  [name]   CHIEF COMPLAINT:  Vent management   INITIAL PRESENTATION:  69 yo male smoker for repair of AAA.  Remained on vent post-op.  PMHx of CAD s/p CABG, CKD III, OSA on CPAP, DM, HTN, mild COPD.   SIGNIFICANT EVENTS: 1/07 OR>> ABF bypass graft, L renal artery bypass 1/10 transfuse 2 units PRBC, renal consulted 1/13 CRRT initiated.  1/16 EXTUBATED 1/16 CRRT discontinued due to improving renal function and Uo 1/17 re- intubated urgently 1/18 bradycardia from precedex >> change to diprivan 1/19 Fever >> add Abx 06/13/14: Tolerating pressure support, but RR still high. 1/22 PSV 6 hours, CVL changed 1/23 PSV most of day, follows commands 1/25 Sister and brother in law updated. Advised to proceed with trach tube placement. They agree. Consent obtained. Trach tube placement scheduled for 1/26 1/26 passed SBT and extubated. Appeared to be tolerating initially. Trach tube canceled 1/27 Tolerating extubation. SLP eval ordered. PT ordered 1/27 CTAP: No significant acute findings 1/28 Intermittent BiPAP for respiratory distress.  1/28 Still requiring intermittent BiPAP for respiratory distress/wheezing  SUBJECTIVE/OVERNIGHT/INTERVAL HX Mild increase WOB. Mildly confused.    VITAL SIGNS: Temp:  [97.7 F (36.5 C)-98.5 F (36.9 C)] 98.5 F (36.9 C) (01/29 1207) Pulse Rate:  [72-99] 85 (01/29 1124) Resp:  [21-32] 29 (01/29 1000) BP: (141-187)/(52-140) 150/73 mmHg (01/29 1000) SpO2:  [89 %-98 %] 96 % (01/29 1213) Weight:  [88.9 kg (195 lb 15.8 oz)] 88.9 kg (195 lb 15.8 oz) (01/29 0500)   HEMODYNAMICS:     VENTILATOR SETTINGS:     INTAKE / OUTPUT:  Intake/Output Summary (Last 24 hours) at 06/21/14 1322 Last data filed at 06/21/14 1200  Gross per 24 hour  Intake   1568 ml  Output   1890 ml  Net    -322 ml   PHYSICAL EXAMINATION: Gen: RASS 0 HEENT: NCAT, WNL PULM minimal wheezes CV: RRR s M AB: midline scar well healed, BS+, soft Ext: warm, bilateral groin surgical wounds open, clean Neuro: no focal deficits noted  LABS: I have reviewed all of today's lab results. Relevant abnormalities are discussed in the A/P section  CXR: NNF   ASSESSMENT / PLAN:  PULMONARY ETT 01/07 >>>1/16, 1/17>> 1/26 A: Hx of OSA, COPD Recurrent acute hypoxic respiratory failure Pulm edema, resolved Recurrent bronchospasm   P:   Cont to monitor in ICU  Cont supp O2 Cont nebulized BDs Add nebulized steroids 1/28 If requires re-intubation, will proceed with trach tube placement  CARDIOVASCULAR R Swan 01/07 >> 1/09 Rt [place] CVL 1/22 >>  Lt IJ HD cath 1/14 >> out A:  S/P AAA repair  Hypovolemic/hemorrhagic shock, resolved New onset PAF/flutter Hx of CAD s/p CABG, HTN, HLD, PAD P:  Cont current Rx Cont full dose LMWH   RENAL A:   AKI, nonoliguric - improving CKD III, baseline Cr 1.8 to 2.1 Hypokalemia, resolved Hypernatremia P:   Monitor BMET intermittently Monitor I/Os Correct electrolytes as indicated D5W ordered 1/28  GASTROINTESTINAL A:   Hx of GERD, chronic PPI use Suspected post extubation dysphagia P:   SUP: PO PPI DC NGT DII diet per SLP  HEMATOLOGIC A:  Acute blood loss anemia, no further blood loss Thrombocytopenia, resolved P: DVT px: full LMWH for PAF Monitor CBC intermittently Transfuse per usual ICU guidelines  INFECTIOUS  A:   Surgical wound infection, L groin P:   All micro and abx reviewed Cont ceftriaxone per VVS  ENDOCRINE A:   H/O "borderline" DM P:   Monitor glu on chem panels Consider resumption of SSI for glu > 180  NEUROLOGIC A:   Acute encephalopathy, improving P:   RASS goal: 0 Minimize sedative/analgesics post extubation    Merton Border, MD ; [facility] service Mobile [phone].  After 5:30 PM or weekends, call  [phone]  06/21/2014 1:22 PM

## 2014-06-21 NOTE — Progress Notes (Signed)
ANTICOAGULATION CONSULT NOTE - Follow Up Consult  Pharmacy Consult for LMWH Indication: atrial fibrillation  Allergies  Allergen Reactions  . Niaspan [Niacin Er] Hives    Patient Measurements: Height: 5\' 7"  (170.2 cm) Weight: 195 lb 15.8 oz (88.9 kg) IBW/kg (Calculated) : 66.1 Heparin Dosing Weight:    Vital Signs: Temp: 98.5 F (36.9 C) (01/29 1207) Temp Source: Oral (01/29 1207) BP: 161/62 mmHg (01/29 1500) Pulse Rate: 68 (01/29 1500)  Labs:  Recent Labs  06/19/14 0349 06/20/14 0420 06/20/14 0500 06/21/14 0430  HGB 9.1*  --  9.9* 10.1*  HCT 28.4*  --  32.1* 32.2*  PLT 287  --  335 316  CREATININE 3.22* 3.08*  --  2.74*    Estimated Creatinine Clearance: 27.4 mL/min (by C-G formula based on Cr of 2.74).   Assessment: 69 yo M admitted 05/30/2014 S/p peripheral bypass surgery.  Coag/Heme: Post-op afib - CHADSvasc 4, CBC low, stable Stopped coumadin 1/17 due to wound healing issues and changed to full dose lovenox, 1/21: 4h anti-xa level is 0.4 - surprisingly low> increased to 120mg  (1.3mg /kg) daily (Goal 0.5-1.2) recheck 1/24 was 0.73. Held lovenox 1/26 for trach, resumed 1/27. LMWH level today 1/29 0.6 (4.5 hrs post-dose) remains within goal range)  Goal of Therapy:  Anti-Xa level 0.6-1 units/ml 4hrs after LMWH dose given Monitor platelets by anticoagulation protocol: Yes   Plan:  Lovenox 120mg  SQ daily.   Fitzgerald Dunne S. Alford Highland, PharmD, BCPS Clinical Staff Pharmacist Pager [phone]  Eilene Ghazi Stillinger 06/21/2014,5:00 PM

## 2014-06-21 NOTE — Progress Notes (Signed)
Physical Therapy Treatment Patient Details Name: Barry Lawrence MRN: [employer]:[id number] DOB: 02/09/1946 Today's Date: 06/21/2014    History of Present Illness Pt adm for AAA repair on 05/30/13. Post op complications include VDRF, renal failure with pt on CVVHD. Extubated 1/26. PMHx of CAD s/p CABG, CKD III, OSA on CPAP, DM, HTN, mild COPD.    PT Comments    Pt with slowly improving mobility able to take some steps today and perform repeated transfers from surfaces. Pt with cues and assist needed for hand placement and anterior translation with fatigue limiting gait. Will continue to follow and recommended daily OOB with nursing as well as bil LE HEP.   Follow Up Recommendations  CIR     Equipment Recommendations       Recommendations for Other Services       Precautions / Restrictions      Mobility  Bed Mobility Overal bed mobility: Needs Assistance         Sit to supine: +2 for physical assistance;Mod assist;Min assist   General bed mobility comments: cues for sequence and assist to elevate legs onto surface  Transfers Overall transfer level: Needs assistance   Transfers: Sit to/from Stand;Stand Pivot Transfers Sit to Stand: +2 physical assistance;Mod assist Stand pivot transfers: Mod assist;+2 safety/equipment       General transfer comment: Pt stood from chair x 3 and BSC x 1 with varied assist from min-mod depending on the trial with max cues for hand placement, anterior translation and safety. pt pivoted to [facility] and after walking pivoted chair to bed  Ambulation/Gait Ambulation/Gait assistance: Min assist;+2 safety/equipment Ambulation Distance (Feet): 5 Feet Assistive device: Rolling walker (2 wheeled) Gait Pattern/deviations: Trunk flexed;Shuffle   Gait velocity interpretation: Below normal speed for age/gender General Gait Details: cues for posture, position in RW with chair pulled directly behind pt   Stairs            Wheelchair Mobility    Modified  Rankin (Stroke Patients Only)       Balance Overall balance assessment: Needs assistance   Sitting balance-Leahy Scale: Fair       Standing balance-Leahy Scale: Poor                      Cognition Arousal/Alertness: Awake/alert Behavior During Therapy: Impulsive;Flat affect Overall Cognitive Status: Impaired/Different from baseline           Safety/Judgement: Decreased awareness of safety   Problem Solving: Requires verbal cues;Requires tactile cues      Exercises      General Comments        Pertinent Vitals/Pain Pain Assessment: No/denies pain  HR 80 sats 93% 3L    Home Living                      Prior Function            PT Goals (current goals can now be found in the care plan section) Progress towards PT goals: Progressing toward goals    Frequency       PT Plan Current plan remains appropriate    Co-evaluation             End of Session Equipment Utilized During Treatment: Gait belt;Oxygen Activity Tolerance: Patient tolerated treatment well Patient left: in bed;with call bell/phone within reach;with family/visitor present     Time: 1110-1135 PT Time Calculation (min) (ACUTE ONLY): 25 min  Charges:  $Therapeutic Activity: 23-37 mins  G CodesMelford Aase July 18, 2014, 12:38 PM Elwyn Reach, [place]

## 2014-06-21 NOTE — Progress Notes (Addendum)
   [facility] [place] and talking, short of breath with audible wheezing.   Objective 158/80 91 97.7 F (36.5 C) (Oral) 30 93%  Intake/Output Summary (Last 24 hours) at 06/21/14 0729 Last data filed at 06/21/14 0600  Gross per 24 hour  Intake 2003.33 ml  Output   1825 ml  Net 178.33 ml    Feet warm well perfused AROM intact Right groin moderate yellow eschar no visible great. Left groin min to moderate yellow eschar no visible graft Abdomin soft nttp Lungs expiratory wheezing  Assessment/Planning: POD # 22 ABF, Left renal bypass  Lungs q4 nebulizer treatments SAT 93 %  NG tube D/C'd dysphagia 2 diet with meal supplements TID Heart Irregularly irregular Lovenox 120 mg QD SQ Groins daily dressings with Santyl Right more eschar than left  Renal Cr 2.74, UO avg 75 cc/hr Rocephin IV antibiotics, left groin E Coli infection    Theda Sers, EMMA [facility] 06/21/2014 7:29 AM --  Laboratory Lab Results:  Recent Labs  06/20/14 0500 06/21/14 0430  WBC 11.8* 12.9*  HGB 9.9* 10.1*  HCT 32.1* 32.2*  PLT 335 316   BMET  Recent Labs  06/20/14 0420 06/21/14 0430  NA 148* 144  K 4.8 4.3  CL 107 102  CO2 31 31  GLUCOSE 127* 120*  BUN 83* 73*  CREATININE 3.08* 2.74*  CALCIUM 9.3 9.4    COAG Lab Results  Component Value Date   INR 1.21 06/18/2014   INR 1.34 06/09/2014   INR 1.21 06/04/2014   No results found for: PTT    Updated family today.  Still having difficulty breathing.  Cr continues to improve.  Keep in ICU another day for pulmonary reasons.  Annamarie Major

## 2014-06-21 NOTE — Progress Notes (Signed)
Pt is having some increased WOB & wheezing this morning. Nebulizer given, pt refused Bipap. Vitals are stable as noted.

## 2014-06-21 NOTE — Progress Notes (Signed)
Subjective: Interval History: has complaints, more wheezing.  Objective: Vital signs in last 24 hours: Temp:  [97.7 F (36.5 C)-98.9 F (37.2 C)] 97.7 F (36.5 C) (01/29 0400) Pulse Rate:  [72-92] 91 (01/29 0600) Resp:  [21-32] 30 (01/29 0600) BP: (141-187)/(52-140) 158/80 mmHg (01/29 0600) SpO2:  [90 %-99 %] 93 % (01/29 0600) FiO2 (%):  [40 %] 40 % (01/28 0800) Weight:  [88.9 kg (195 lb 15.8 oz)] 88.9 kg (195 lb 15.8 oz) (01/29 0500) Weight change: -0.6 kg (-1 lb 5.2 oz)  Intake/Output from previous day: 01/28 0701 - 01/29 0700 In: 2003.3 [P.O.:720; I.V.:983.3; NG/GT:300] Out: 1825 [Urine:1825] Intake/Output this shift:    General appearance: cooperative, mild distress, moderately obese, pale and lethargic but cooperative Resp: diminished breath sounds bilaterally and wheezes bilaterally Cardio: S1, S2 normal and systolic murmur: holosystolic 2/6, blowing at apex GI: pos bs, mild distension, midline incision Extremities: edema 2-3 +  Lab Results:  Recent Labs  06/20/14 0500 06/21/14 0430  WBC 11.8* 12.9*  HGB 9.9* 10.1*  HCT 32.1* 32.2*  PLT 335 316   BMET:  Recent Labs  06/20/14 0420 06/21/14 0430  NA 148* 144  K 4.8 4.3  CL 107 102  CO2 31 31  GLUCOSE 127* 120*  BUN 83* 73*  CREATININE 3.08* 2.74*  CALCIUM 9.3 9.4   No results for input(s): PTH in the last 72 hours. Iron Studies: No results for input(s): IRON, TIBC, TRANSFERRIN, FERRITIN in the last 72 hours.  Studies/Results: Ct Abdomen Pelvis Wo Contrast  06/19/2014   CLINICAL DATA:  Initial evaluation for abdominal swelling and possible intraperitoneal air seen on chest radiograph performed today, aortic aneurysm repair 05/30/14  EXAM: CT ABDOMEN AND PELVIS WITHOUT CONTRAST  TECHNIQUE: Multidetector CT imaging of the abdomen and pelvis was performed following the standard protocol without IV contrast.  COMPARISON:  06/19/2014 chest radiographs, 03/05/2014 CT scan  FINDINGS: Mild cardiac enlargement.  Status post CABG. Small bilateral pleural effusions with myeloma underlying mild atelectasis.  NG tube passes into the duodenum at the level of the descending and third portions of the duodenum. Liver is normal. There are multiple gallstones in total measuring 3.2 cm. Gallbladder is not abnormally distended. No significant pericholecystic inflammation.  Spleen is normal. Pancreas is normal. 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 Kidneys are normal.  There is calcification of the abdominal aorta. There is distal abdominal aortic aneurysm. Maximal diameter is 48 x 39 mm, notably smaller when compared to prior study.The patient is status post aortobifemoral bypass since the prior CT. There is inflammatory change in the retroperitoneum surrounding the aorta at the level of the bypass graft and extending inferiorly to below the bifurcation. Inflammatory change extends mildly around both perinephric spaces. There is a small volume of free fluid in the pelvis.  Bladder is normal except for small amount of air within it which is likely related to the observed Foley catheter.  Nonobstructive bowel gas pattern with oral contrast throughout the bowel. Appendix, large bowel, small bowel, and stomach are normal. No evidence of pneumoperitoneum or pneumatosis intestinalis.  IMPRESSION: 1. No evidence of pneumoperitoneum. 2. Inflammatory change surrounding the distal abdominal aorta extending into both perinephric spaces. Patient is status aortobifemoral bypass and aneurysm repair, and the findings may simply represent postoperative change. The integrity of the repair would require iv contrast to assess further. 3. Numerous nonacute findings as discussed above.   Electronically Signed   By: Skipper Cliche M.D.   On: 06/19/2014 17:46  I have reviewed the patient's current medications.  Assessment/Plan: 1 CKD 4 Cr cont to fall slowly.  Vol xs.  SNa ok now with D5.  Try to limit vol 2 Anemia rising on epo 3 HPT vit  D 4 Copd still a major issue 5 PVD per VVS 6 Groin wounds 7 CAD 8 Carotid dz.  9 HTN better P coreg, give 1 dose Lasix, epo, vit D ,     LOS: 22 days   Barry Lawrence L 06/21/2014,7:34 AM

## 2014-06-22 LAB — CBC
HCT: 32.2 % — ABNORMAL LOW (ref 39.0–52.0)
Hemoglobin: 10.2 g/dL — ABNORMAL LOW (ref 13.0–17.0)
MCH: 30.4 pg (ref 26.0–34.0)
MCHC: 31.7 g/dL (ref 30.0–36.0)
MCV: 96.1 fL (ref 78.0–100.0)
Platelets: 328 10*3/uL (ref 150–400)
RBC: 3.35 MIL/uL — ABNORMAL LOW (ref 4.22–5.81)
RDW: 14.8 % (ref 11.5–15.5)
WBC: 9.7 10*3/uL (ref 4.0–10.5)

## 2014-06-22 LAB — RENAL FUNCTION PANEL
Albumin: 2.1 g/dL — ABNORMAL LOW (ref 3.5–5.2)
Anion gap: 7 (ref 5–15)
BUN: 68 mg/dL — ABNORMAL HIGH (ref 6–23)
CO2: 31 mmol/L (ref 19–32)
Calcium: 9.3 mg/dL (ref 8.4–10.5)
Chloride: 101 mmol/L (ref 96–112)
Creatinine, Ser: 2.58 mg/dL — ABNORMAL HIGH (ref 0.50–1.35)
GFR calc Af Amer: 28 mL/min — ABNORMAL LOW (ref >90)
GFR calc non Af Amer: 24 mL/min — ABNORMAL LOW (ref >90)
Glucose, Bld: 95 mg/dL (ref 70–99)
Phosphorus: 4.8 mg/dL — ABNORMAL HIGH (ref 2.3–4.6)
Potassium: 3.5 mmol/L (ref 3.5–5.1)
Sodium: 139 mmol/L (ref 135–145)

## 2014-06-22 LAB — GLUCOSE, CAPILLARY
Glucose-Capillary: 81 mg/dL (ref 70–99)
Glucose-Capillary: 89 mg/dL (ref 70–99)
Glucose-Capillary: 90 mg/dL (ref 70–99)
Glucose-Capillary: 98 mg/dL (ref 70–99)

## 2014-06-22 MED ORDER — FUROSEMIDE 80 MG PO TABS
80.0000 mg | ORAL_TABLET | Freq: Two times a day (BID) | ORAL | Status: DC
  Administered 2014-06-22 – 2014-06-28 (×13): 80 mg via ORAL
  Filled 2014-06-22 (×16): qty 1
  Filled 2014-06-22: qty 2

## 2014-06-22 MED ORDER — POTASSIUM CHLORIDE CRYS ER 20 MEQ PO TBCR
40.0000 meq | EXTENDED_RELEASE_TABLET | Freq: Once | ORAL | Status: AC
  Administered 2014-06-22: 40 meq via ORAL
  Filled 2014-06-22: qty 2

## 2014-06-22 MED ORDER — POTASSIUM CHLORIDE CRYS ER 20 MEQ PO TBCR
20.0000 meq | EXTENDED_RELEASE_TABLET | Freq: Two times a day (BID) | ORAL | Status: DC
  Administered 2014-06-22 – 2014-06-24 (×6): 20 meq via ORAL
  Filled 2014-06-22 (×9): qty 1

## 2014-06-22 NOTE — Progress Notes (Signed)
    Subjective  -   Resting comfortably in chair Did not use Bi-pap overnight Oral intake is minimal   Physical Exam:  abd soft Extremities warm Groin dressings dry Respirations with audible wheezes, not using accessory muscles as much today       Assessment/Plan: s/p ABF with left renal bypass  Pulmonary:  Breathing a little better today GI:  Encourage PO intake, ad supplements Dispo:  Ultimately will need CIR, but not ready yet Renal:  Cr continues to improve, almost at baseline of around 2.0, good output Can goto 3S if ok with CCM, otherwise, continue to monitor in ICU  Barry Lawrence IV, V. WELLS 06/22/2014 10:07 AM --  Filed Vitals:   06/22/14 0900  BP: 139/68  Pulse: 66  Temp:   Resp: 22    Intake/Output Summary (Last 24 hours) at 06/22/14 1007 Last data filed at 06/22/14 0400  Gross per 24 hour  Intake    700 ml  Output    676 ml  Net     24 ml     Laboratory CBC    Component Value Date/Time   WBC 9.7 06/22/2014 0418   HGB 10.2* 06/22/2014 0418   HCT 32.2* 06/22/2014 0418   PLT 328 06/22/2014 0418    BMET    Component Value Date/Time   NA 139 06/22/2014 0418   K 3.5 06/22/2014 0418   CL 101 06/22/2014 0418   CO2 31 06/22/2014 0418   GLUCOSE 95 06/22/2014 0418   BUN 68* 06/22/2014 0418   CREATININE 2.58* 06/22/2014 0418   CREATININE 2.02* 05/03/2014 0829   CALCIUM 9.3 06/22/2014 0418   GFRNONAA 24* 06/22/2014 0418   GFRAA 28* 06/22/2014 0418    COAG Lab Results  Component Value Date   INR 1.21 06/18/2014   INR 1.34 06/09/2014   INR 1.21 06/04/2014   No results found for: PTT  Antibiotics Anti-infectives    Start     Dose/Rate Route Frequency Ordered Stop   06/14/14 1100  cefTRIAXone (ROCEPHIN) 1 g in dextrose 5 % 50 mL IVPB - Premix     1 g100 mL/hr over 30 Minutes Intravenous Every 24 hours 06/14/14 1011     06/11/14 2200  vancomycin (VANCOCIN) IVPB 1000 mg/200 mL premix  Status:  Discontinued     1,000 mg200 mL/hr over 60  Minutes Intravenous Every 48 hours 06/11/14 2058 06/14/14 1008   06/11/14 2130  piperacillin-tazobactam (ZOSYN) IVPB 2.25 g  Status:  Discontinued     2.25 g100 mL/hr over 30 Minutes Intravenous 3 times per day 06/11/14 2058 06/14/14 1011   05/31/14 0100  cefUROXime (ZINACEF) 1.5 g in dextrose 5 % 50 mL IVPB     1.5 g100 mL/hr over 30 Minutes Intravenous Every 12 hours 05/30/14 1732 05/31/14 1235   05/30/14 1330  cefUROXime (ZINACEF) 1.5 g in dextrose 5 % 50 mL IVPB  Status:  Discontinued     1.5 g100 mL/hr over 30 Minutes Intravenous To Surgery 05/30/14 1326 05/30/14 1716   05/29/14 1455  cefUROXime (ZINACEF) 1.5 g in dextrose 5 % 50 mL IVPB     1.5 g100 mL/hr over 30 Minutes Intravenous 30 min pre-op 05/29/14 1455 05/30/14 1343       V. Leia Alf, M.D. [facility] [place] Office: [phone] Pager:  [phone]

## 2014-06-22 NOTE — Progress Notes (Signed)
SLP Cancellation Note  Patient Details Name: Barry Lawrence MRN: [id number] DOB: Sep 14, 1945   Cancelled treatment:        SLP went to see patient at 2:45 PM to check on his diet toleration. He declined any P.O.'s and appeared very fatigued/sleepy. Will attempt to see him at later date.   Nadara Mode Tarrell 06/22/2014, 4:35 PM  Sonia Baller, [place], CCC-SLP 06/22/2014 4:37 PM

## 2014-06-22 NOTE — Progress Notes (Signed)
Subjective: Interval History: has no complaint .  Objective: Vital signs in last 24 hours: Temp:  [97.4 F (36.3 C)-98.5 F (36.9 C)] 97.9 F (36.6 C) (01/30 0755) Pulse Rate:  [67-99] 68 (01/30 0600) Resp:  [16-31] 26 (01/30 0600) BP: (137-178)/(52-105) 159/52 mmHg (01/30 0500) SpO2:  [89 %-100 %] 100 % (01/30 0600) Weight change:   Intake/Output from previous day: 01/29 0701 - 01/30 0700 In: 938 [P.O.:750; I.V.:88; IV Piggyback:100] Out: 951 [Urine:950; Stool:1] Intake/Output this shift:    General appearance: pale and slowed mentation Resp: diminished breath sounds bilaterally, rales bibasilar and wheezes bibasilar Cardio: S1, S2 normal and systolic murmur: holosystolic 2/6, blowing at apex GI: pos bs, liver down 5 cm, midline incision Extremities: edema 2+, groin wounds dressed,  and feet warm  Lab Results:  Recent Labs  06/21/14 0430 06/22/14 0418  WBC 12.9* 9.7  HGB 10.1* 10.2*  HCT 32.2* 32.2*  PLT 316 328   BMET:  Recent Labs  06/21/14 0430 06/22/14 0418  NA 144 139  K 4.3 3.5  CL 102 101  CO2 31 31  GLUCOSE 120* 95  BUN 73* 68*  CREATININE 2.74* 2.58*  CALCIUM 9.4 9.3   No results for input(s): PTH in the last 72 hours. Iron Studies: No results for input(s): IRON, TIBC, TRANSFERRIN, FERRITIN in the last 72 hours.  Studies/Results: No results found.  I have reviewed the patient's current medications.  Assessment/Plan: 1 CKD 4 still improving and regaining concentrating ability , normalizing SNa.  Will put on Lasix as is vol xs yet and may help resp status, close F/U bicarb.  Give k 2 COPD severe, f/u Pulm 3 PVD per VVS, wound healing 4 anemia slowly better 5 HPTH on med 6 HTN fair control 7 CAD 8 Carotid dz P lasix , Kcl wound care, pulm interventions.    LOS: 23 days   Sharrell Krawiec L 06/22/2014,8:36 AM

## 2014-06-22 NOTE — Progress Notes (Signed)
In reviewing RT charting, pt has not been using BIPAP.

## 2014-06-22 NOTE — Progress Notes (Signed)
PULMONARY / CRITICAL CARE MEDICINE   Name: Barry Lawrence MRN: [id number] DOB: July 21, 1945   LOS 23 days   ADMISSION DATE:  05/30/2014 CONSULTATION DATE:  05/30/2014  REFERRING MD :  [name]   CHIEF COMPLAINT:  Vent management   INITIAL PRESENTATION:  69 yo male smoker for repair of AAA.  Remained on vent post-op.  PMHx of CAD s/p CABG, CKD III, OSA on CPAP, DM, HTN, mild COPD.   SIGNIFICANT EVENTS: 1/07 OR>> ABF bypass graft, L renal artery bypass 1/10 transfuse 2 units PRBC, renal consulted 1/13 CRRT initiated.  1/16 EXTUBATED 1/16 CRRT discontinued due to improving renal function and Uo 1/17 re- intubated urgently 1/18 bradycardia from precedex >> change to diprivan 1/19 Fever >> add Abx 06/13/14: Tolerating pressure support, but RR still high. 1/22 PSV 6 hours, CVL changed 1/23 PSV most of day, follows commands 1/25 Sister and brother in law updated. Advised to proceed with trach tube placement. They agree. Consent obtained. Trach tube placement scheduled for 1/26 1/26 passed SBT and extubated. Appeared to be tolerating initially. Trach tube canceled 1/27 Tolerating extubation. SLP eval ordered. PT ordered 1/27 CTAP: No significant acute findings 1/28 Intermittent BiPAP for respiratory distress.  1/28 Still requiring intermittent BiPAP for respiratory distress/wheezing  SUBJECTIVE/OVERNIGHT/INTERVAL HX NAD. No new complaints    VITAL SIGNS: Temp:  [97.4 F (36.3 C)-98.5 F (36.9 C)] 97.4 F (36.3 C) (01/30 1207) Pulse Rate:  [65-96] 71 (01/30 1430) Resp:  [16-31] 25 (01/30 1400) BP: (133-178)/(52-99) 168/75 mmHg (01/30 1430) SpO2:  [90 %-100 %] 98 % (01/30 1430)   HEMODYNAMICS:     VENTILATOR SETTINGS:     INTAKE / OUTPUT:  Intake/Output Summary (Last 24 hours) at 06/22/14 1447 Last data filed at 06/22/14 1300  Gross per 24 hour  Intake   1010 ml  Output    676 ml  Net    334 ml   PHYSICAL EXAMINATION: Gen: RASS 0 HEENT: NCAT, WNL PULM minimal  wheezes CV: RRR s M AB: midline scar well healed, BS+, soft Ext: warm, bilateral groin surgical wounds open, clean Neuro: no focal deficits noted  LABS: I have reviewed all of today's lab results. Relevant abnormalities are discussed in the A/P section  CXR: NNF   ASSESSMENT / PLAN:  PULMONARY ETT 01/07 >>>1/16, 1/17>> 1/26 A: Hx of OSA, COPD Recurrent acute hypoxic respiratory failure Pulm edema, resolved Recurrent bronchospasm   P:    Cont supp O2 Cont nebulized BDs Cont nebulized steroids  CARDIOVASCULAR R Swan 01/07 >> 1/09 Rt [place] CVL 1/22 >>  Lt IJ HD cath 1/14 >> out A:  S/P AAA repair  Hypovolemic/hemorrhagic shock, resolved New onset PAF/flutter Hx of CAD s/p CABG, HTN, HLD, PAD P:  Cont current Rx Cont full dose LMWH   RENAL A:   AKI, nonoliguric - improving CKD III, baseline Cr 1.8 to 2.1 Hypokalemia, resolved Hypernatremia P:   Monitor BMET intermittently Monitor I/Os Correct electrolytes as indicated  GASTROINTESTINAL A:   Hx of GERD, chronic PPI use Suspected post extubation dysphagia P:   SUP: PO PPI DII diet per SLP  HEMATOLOGIC A:  Acute blood loss anemia, no further blood loss Thrombocytopenia, resolved P: DVT px: full LMWH for PAF Monitor CBC intermittently Transfuse per usual ICU guidelines  INFECTIOUS A:   Surgical wound infection, L groin P:   All micro and abx reviewed Cont ceftriaxone per VVS  ENDOCRINE A:   H/O "borderline" DM P:   Monitor glu on chem  panels Consider resumption of SSI for glu > 180  NEUROLOGIC A:   Acute encephalopathy, improving P:   RASS goal: 0 Minimize sedative/analgesics post extubation   He seems to be improving slowly. From PCCM perspective, he may be transferred to SDU level of care.  PCCM will see again Mon 2/01. Please call sooner PRN  Merton Border, MD ; [facility] [phone].  After 5:30 PM or weekends, call [phone]  06/22/2014 2:47 PM

## 2014-06-23 LAB — RENAL FUNCTION PANEL
Albumin: 2.2 g/dL — ABNORMAL LOW (ref 3.5–5.2)
Anion gap: 9 (ref 5–15)
BUN: 62 mg/dL — ABNORMAL HIGH (ref 6–23)
CO2: 29 mmol/L (ref 19–32)
Calcium: 9.1 mg/dL (ref 8.4–10.5)
Chloride: 98 mmol/L (ref 96–112)
Creatinine, Ser: 2.39 mg/dL — ABNORMAL HIGH (ref 0.50–1.35)
GFR calc Af Amer: 30 mL/min — ABNORMAL LOW (ref >90)
GFR calc non Af Amer: 26 mL/min — ABNORMAL LOW (ref >90)
Glucose, Bld: 94 mg/dL (ref 70–99)
Phosphorus: 4.7 mg/dL — ABNORMAL HIGH (ref 2.3–4.6)
Potassium: 4.1 mmol/L (ref 3.5–5.1)
Sodium: 136 mmol/L (ref 135–145)

## 2014-06-23 LAB — CBC
HCT: 31.8 % — ABNORMAL LOW (ref 39.0–52.0)
Hemoglobin: 9.8 g/dL — ABNORMAL LOW (ref 13.0–17.0)
MCH: 29.4 pg (ref 26.0–34.0)
MCHC: 30.8 g/dL (ref 30.0–36.0)
MCV: 95.5 fL (ref 78.0–100.0)
Platelets: 327 K/uL (ref 150–400)
RBC: 3.33 MIL/uL — ABNORMAL LOW (ref 4.22–5.81)
RDW: 14.9 % (ref 11.5–15.5)
WBC: 8.5 K/uL (ref 4.0–10.5)

## 2014-06-23 LAB — GLUCOSE, CAPILLARY
Glucose-Capillary: 89 mg/dL (ref 70–99)
Glucose-Capillary: 90 mg/dL (ref 70–99)
Glucose-Capillary: 91 mg/dL (ref 70–99)

## 2014-06-23 NOTE — Progress Notes (Addendum)
   [facility] [place]  Subjective  - No nausea or vomiting.  No apatite but trying to eat a little more.  Sitting up more in bed side chair.   Objective 171/79 85 98.6 F (37 C) (Oral) 23 94%  Intake/Output Summary (Last 24 hours) at 06/23/14 0759 Last data filed at 06/23/14 0600  Gross per 24 hour  Intake   1940 ml  Output   1750 ml  Net    190 ml    Feet warm well perfused Abdomin soft non tender to palpation.  Pos BS. Sitting up in chair comfortable just since 6:45 am waiting for breakfast. Lungs CTA 2L vis NS  Assessment/Planning: POD # 23 ABF, left renal bypass Lungs 2L [place] Heart rate controlled A fib Lovenox 120 SQ Increasing mobility tolerating sitting up more UO 200 cc /hr.  Lasix 80 mg BID Cr 2.38 slowly decresing     Laurence Slate [facility] 06/23/2014 7:59 AM --  Laboratory Lab Results:  Recent Labs  06/22/14 0418 06/23/14 0425  WBC 9.7 8.5  HGB 10.2* 9.8*  HCT 32.2* 31.8*  PLT 328 327   BMET  Recent Labs  06/22/14 0418 06/23/14 0425  NA 139 136  K 3.5 4.1  CL 101 98  CO2 31 29  GLUCOSE 95 94  BUN 68* 62*  CREATININE 2.58* 2.39*  CALCIUM 9.3 9.1    COAG Lab Results  Component Value Date   INR 1.21 06/18/2014   INR 1.34 06/09/2014   INR 1.21 06/04/2014   No results found for: PTT    I agree with the above.  Continue with pulmonary toilet and encouragement of oral intake.  The patient also needs more aggressive PT/OT with hopes for inpatient rehabilitation.  Annamarie Major

## 2014-06-23 NOTE — Progress Notes (Signed)
Subjective: Interval History: has no complaint, conversant, .  Objective: Vital signs in last 24 hours: Temp:  [97.4 F (36.3 C)-98.7 F (37.1 C)] 97.9 F (36.6 C) (01/31 0813) Pulse Rate:  [63-85] 84 (01/31 0800) Resp:  [17-31] 31 (01/31 0800) BP: (120-187)/(61-88) 178/70 mmHg (01/31 0800) SpO2:  [93 %-100 %] 97 % (01/31 0850) Weight change:   Intake/Output from previous day: 01/30 0701 - 01/31 0700 In: 1940 [P.O.:1860; I.V.:30; IV Piggyback:50] Out: 1750 [Urine:1750] Intake/Output this shift: Total I/O In: 60 [P.O.:60] Out: 200 [Urine:200]  General appearance: cooperative, pale, slowed mentation and more alert, conversant,  Resp: diminished breath sounds bilaterally and rales bibasilar Cardio: S1, S2 normal and systolic murmur: holosystolic 2/6, blowing at apex GI: pos bs, midline incision,  soft,   Extremities: edema 3+, Groin wounds dressed and feet warm  Lab Results:  Recent Labs  06/22/14 0418 06/23/14 0425  WBC 9.7 8.5  HGB 10.2* 9.8*  HCT 32.2* 31.8*  PLT 328 327   BMET:  Recent Labs  06/22/14 0418 06/23/14 0425  NA 139 136  K 3.5 4.1  CL 101 98  CO2 31 29  GLUCOSE 95 94  BUN 68* 62*  CREATININE 2.58* 2.39*  CALCIUM 9.3 9.1   No results for input(s): PTH in the last 72 hours. Iron Studies: No results for input(s): IRON, TIBC, TRANSFERRIN, FERRITIN in the last 72 hours.  Studies/Results: No results found.  I have reviewed the patient's current medications.  Assessment/Plan: 1 CKD4 improving Cr and diuresing.  Still vol xs.  K ok. Given K to help alk 2 anemia stable 3 HTN on meds follow closely and allow to diurese as will come down 4 COPD per pulm appears improving.  Lower vol to help keep out of trouble 5 PVD per VVS 6 CAD 7 Carotid dz.   8 Afib P lasix, K, follow vol, bicarb, resp status    LOS: 24 days   Collan Schoenfeld L 06/23/2014,9:07 AM

## 2014-06-24 LAB — COMPREHENSIVE METABOLIC PANEL
ALT: 30 U/L (ref 0–53)
AST: 22 U/L (ref 0–37)
Albumin: 2.4 g/dL — ABNORMAL LOW (ref 3.5–5.2)
Alkaline Phosphatase: 75 U/L (ref 39–117)
Anion gap: 7 (ref 5–15)
BUN: 57 mg/dL — ABNORMAL HIGH (ref 6–23)
CO2: 31 mmol/L (ref 19–32)
Calcium: 9.1 mg/dL (ref 8.4–10.5)
Chloride: 99 mmol/L (ref 96–112)
Creatinine, Ser: 2.24 mg/dL — ABNORMAL HIGH (ref 0.50–1.35)
GFR calc Af Amer: 33 mL/min — ABNORMAL LOW (ref >90)
GFR calc non Af Amer: 28 mL/min — ABNORMAL LOW (ref >90)
Glucose, Bld: 104 mg/dL — ABNORMAL HIGH (ref 70–99)
Potassium: 4 mmol/L (ref 3.5–5.1)
Sodium: 137 mmol/L (ref 135–145)
Total Bilirubin: 0.7 mg/dL (ref 0.3–1.2)
Total Protein: 5.9 g/dL — ABNORMAL LOW (ref 6.0–8.3)

## 2014-06-24 LAB — CBC
HCT: 32.4 % — ABNORMAL LOW (ref 39.0–52.0)
Hemoglobin: 10.3 g/dL — ABNORMAL LOW (ref 13.0–17.0)
MCH: 29.6 pg (ref 26.0–34.0)
MCHC: 31.8 g/dL (ref 30.0–36.0)
MCV: 93.1 fL (ref 78.0–100.0)
Platelets: 320 10*3/uL (ref 150–400)
RBC: 3.48 MIL/uL — ABNORMAL LOW (ref 4.22–5.81)
RDW: 15 % (ref 11.5–15.5)
WBC: 8.5 10*3/uL (ref 4.0–10.5)

## 2014-06-24 LAB — GLUCOSE, CAPILLARY
Glucose-Capillary: 80 mg/dL (ref 70–99)
Glucose-Capillary: 82 mg/dL (ref 70–99)
Glucose-Capillary: 85 mg/dL (ref 70–99)
Glucose-Capillary: 91 mg/dL (ref 70–99)

## 2014-06-24 LAB — PHOSPHORUS: Phosphorus: 4.4 mg/dL (ref 2.3–4.6)

## 2014-06-24 MED ORDER — TRAMADOL HCL 50 MG PO TABS
50.0000 mg | ORAL_TABLET | Freq: Four times a day (QID) | ORAL | Status: DC | PRN
  Administered 2014-06-24: 100 mg via ORAL
  Administered 2014-06-24: 50 mg via ORAL
  Administered 2014-06-25 – 2014-06-28 (×7): 100 mg via ORAL
  Filled 2014-06-24 (×3): qty 2
  Filled 2014-06-24: qty 1
  Filled 2014-06-24 (×5): qty 2

## 2014-06-24 NOTE — Progress Notes (Signed)
S:  Eating OK.  Up walking.  Bowels moving O:BP 141/63 mmHg  Pulse 63  Temp(Src) 97.6 F (36.4 C) (Oral)  Resp 18  Ht 5\' 7"  (1.702 m)  Wt 88.9 kg (195 lb 15.8 oz)  BMI 30.69 kg/m2  SpO2 96%  Intake/Output Summary (Last 24 hours) at 06/24/14 0744 Last data filed at 06/24/14 0600  Gross per 24 hour  Intake   1920 ml  Output   1600 ml  Net    320 ml   Weight change:  Gen: Awake and alert CVS: Irreg, irreg Resp: Decreased BS bases Abd: + BS ND, minimal tenderness around wound Ext: 0-tr edema NEURO:CNI OX3 no asterixis   . antiseptic oral rinse  7 mL Mouth Rinse q12n4p  . budesonide  0.25 mg Nebulization 4 times per day  . calcitRIOL  0.25 mcg Oral Daily  . carvedilol  12.5 mg Oral BID WC  . cefTRIAXone (ROCEPHIN)  IV  1 g Intravenous Q24H  . chlorhexidine  15 mL Mouth Rinse BID  . collagenase   Topical Daily  . darbepoetin (ARANESP) injection - NON-DIALYSIS  100 mcg Subcutaneous Q Wed-1800  . enoxaparin (LOVENOX) injection  120 mg Subcutaneous Q24H  . furosemide  80 mg Oral BID  . ipratropium  0.5 mg Nebulization Q6H  . levalbuterol  0.63 mg Nebulization Q6H  . pantoprazole  40 mg Oral Q1200  . potassium chloride  20 mEq Oral BID  . sodium chloride  10-40 mL Intracatheter Q12H   No results found. BMET    Component Value Date/Time   NA 137 06/24/2014 0430   K 4.0 06/24/2014 0430   CL 99 06/24/2014 0430   CO2 31 06/24/2014 0430   GLUCOSE 104* 06/24/2014 0430   BUN 57* 06/24/2014 0430   CREATININE 2.24* 06/24/2014 0430   CREATININE 2.02* 05/03/2014 0829   CALCIUM 9.1 06/24/2014 0430   GFRNONAA 28* 06/24/2014 0430   GFRAA 33* 06/24/2014 0430   CBC    Component Value Date/Time   WBC 8.5 06/24/2014 0430   RBC 3.48* 06/24/2014 0430   HGB 10.3* 06/24/2014 0430   HCT 32.4* 06/24/2014 0430   PLT 320 06/24/2014 0430   MCV 93.1 06/24/2014 0430   MCH 29.6 06/24/2014 0430   MCHC 31.8 06/24/2014 0430   RDW 15.0 06/24/2014 0430   LYMPHSABS 1.0 06/17/2014 0343   MONOABS 0.6 06/17/2014 0343   EOSABS 0.2 06/17/2014 0343   BASOSABS 0.1 06/17/2014 0343     Assessment:  1. Acute on CKD 3/4 sec ATN following aortobifem and lt renal art bypass 2. COPD 3. CAD, stable 4. A fib 5. Anemia on aranesp  Plan: 1. Renal fx close to baseline.  Vol status improving 2. Cont diuretics 3. Recheck labs in AM   Ceola Para T

## 2014-06-24 NOTE — Progress Notes (Signed)
Thank you for consult on [name]. Chart reviwed and note that patient is showing great improvement in mobility in the past 48 hours. He's min assist for transfers and min-guard assist for ambulating 200 feet. He is too high level for CIR and anticipate that he should progress to modified independent to supervision level with therapy over next 1-2 days. Will defer CIR consult for now.

## 2014-06-24 NOTE — Progress Notes (Addendum)
   [facility] [employer]  Subjective  - Sitting up alert and breathing well with less work.   Objective 150/72 72 97.7 F (36.5 C) (Oral) 29 98%  Intake/Output Summary (Last 24 hours) at 06/24/14 1109 Last data filed at 06/24/14 0942  Gross per 24 hour  Intake   1742 ml  Output   1500 ml  Net    242 ml    Abdomin soft non tender Feet warm well perfused Left groin beefy red walls, yellow base.  No graft visible Right groin minimal beefy red walls, base yellow.  No graft visible Ambulated from bed to chair with mod assistance to get up, but stand by assistance for walking with rolling walker Lungs CTA no audible wheezing.  [place] 2L93-98% SAT.   Assessment/Planning: POD #24 ABF, left renal bypass  Lungs 2L [place] Heart rate controlled A fib Lovenox 120 SQ Increasing mobility tolerating sitting up more. Walk 2 times yesterday and once this am without getting short of breath.  5-60 feet. Cr decreasing slowly now 2.24.  UO average 200 cc/hr.  Still receiving 80 mg Lasix BID. Will transfer to 3S today Increase mobility work toward in patient rehab verses SNF.   Laurence Slate [facility] 06/24/2014 11:09 AM --  Laboratory Lab Results:  Recent Labs  06/23/14 0425 06/24/14 0430  WBC 8.5 8.5  HGB 9.8* 10.3*  HCT 31.8* 32.4*  PLT 327 320   BMET  Recent Labs  06/23/14 0425 06/24/14 0430  NA 136 137  K 4.1 4.0  CL 98 99  CO2 29 31  GLUCOSE 94 104*  BUN 62* 57*  CREATININE 2.39* 2.24*  CALCIUM 9.1 9.1    COAG Lab Results  Component Value Date   INR 1.21 06/18/2014   INR 1.34 06/09/2014   INR 1.21 06/04/2014   No results found for: PTT    Pt just arrived to Step down.  Wants to go home.  Ambulated earlier today.  Cr continues to improve.  Will encourage PO intake.  Likely will need CIR.  WOuld like to do this close to home if possible   Annamarie Major

## 2014-06-24 NOTE — Progress Notes (Addendum)
PULMONARY / CRITICAL CARE MEDICINE   Name: Barry Lawrence MRN: [place]:[id number] DOB: 1945/08/22   LOS 25 days   ADMISSION DATE:  05/30/2014 CONSULTATION DATE:  05/30/2014  REFERRING MD :  [name]   CHIEF COMPLAINT:  Vent management   INITIAL PRESENTATION:  69 yo male smoker for repair of AAA.  Remained on vent post-op.  PMHx of CAD s/p CABG, CKD III, OSA on CPAP, DM, HTN, mild COPD.   SIGNIFICANT EVENTS: 1/07 OR>> ABF bypass graft, L renal artery bypass 1/10 transfuse 2 units PRBC, renal consulted 1/13 CRRT initiated.  1/16 EXTUBATED 1/16 CRRT discontinued due to improving renal function and Uo 1/17 re- intubated urgently 1/18 bradycardia from precedex >> change to diprivan 1/19 Fever >> add Abx 06/13/14: Tolerating pressure support, but RR still high. 1/22 PSV 6 hours, CVL changed 1/23 PSV most of day, follows commands 1/25 Sister and brother in law updated. Advised to proceed with trach tube placement. They agree. Consent obtained. Trach tube placement scheduled for 1/26 1/26 passed SBT and extubated. Appeared to be tolerating initially. Trach tube canceled 1/27 Tolerating extubation. SLP eval ordered. PT ordered 1/27 CTAP: No significant acute findings 1/28 Intermittent BiPAP for respiratory distress.  1/28 Still requiring intermittent BiPAP for respiratory distress/wheezing  SUBJECTIVE Still has chest congestion.  VITAL SIGNS: Temp:  [97.6 F (36.4 C)-98.2 F (36.8 C)] 97.7 F (36.5 C) (02/01 0807) Pulse Rate:  [63-102] 71 (02/01 1100) Resp:  [18-34] 21 (02/01 1100) BP: (119-183)/(42-88) 151/62 mmHg (02/01 1100) SpO2:  [91 %-100 %] 96 % (02/01 1100) FiO2 (%):  [88 %-99 %] 98 % (02/01 1100)   INTAKE / OUTPUT:  Intake/Output Summary (Last 24 hours) at 06/24/14 1202 Last data filed at 06/24/14 1100  Gross per 24 hour  Intake   1862 ml  Output   1800 ml  Net     62 ml   PHYSICAL EXAMINATION: Gen: sitting in chair HEENT: NCAT, WNL PULM minimal wheezes CV: RRR s  M AB: midline scar well healed, BS+, soft Ext: warm, bilateral groin surgical wounds open, clean Neuro: no focal deficits noted  LABS: CBC Recent Labs     06/22/14  0418  06/23/14  0425  06/24/14  0430  WBC  9.7  8.5  8.5  HGB  10.2*  9.8*  10.3*  HCT  32.2*  31.8*  32.4*  PLT  328  327  320    BMET Recent Labs     06/22/14  0418  06/23/14  0425  06/24/14  0430  NA  139  136  137  K  3.5  4.1  4.0  CL  101  98  99  CO2  31  29  31   BUN  68*  62*  57*  CREATININE  2.58*  2.39*  2.24*  GLUCOSE  95  94  104*    Electrolytes Recent Labs     06/22/14  0418  06/23/14  0425  06/24/14  0430  CALCIUM  9.3  9.1  9.1  PHOS  4.8*  4.7*  4.4   Liver Enzymes Recent Labs     06/22/14  0418  06/23/14  0425  06/24/14  0430  AST   --    --   22  ALT   --    --   30  ALKPHOS   --    --   75  BILITOT   --    --   0.7  ALBUMIN  2.1*  2.2*  2.4*   Glucose Recent Labs     06/22/14  1600  06/23/14  0006  06/23/14  0810  06/23/14  1607  06/24/14  0042  06/24/14  0805  GLUCAP  89  90  89  91  80  91    Imaging No results found.    ASSESSMENT / PLAN:  PULMONARY ETT 01/07 >>>1/16, 1/17>> 1/26 A: Hx of OSA, COPD Recurrent acute hypoxic respiratory failure Pulm edema, resolved Recurrent bronchospasm   P:    Cont supp O2 Cont nebulized BDs Cont nebulized steroids  CARDIOVASCULAR R [place] 01/07 >> 1/09 Rt [place] CVL 1/22 >>  Lt IJ HD cath 1/14 >> out A:  S/P AAA repair  Hypovolemic/hemorrhagic shock, resolved New onset PAF/flutter Hx of CAD s/p CABG, HTN, HLD, PAD P:  Cont current Rx Cont full dose LMWH  Can d/c CVL if okay with primary team  RENAL A:   AKI, nonoliguric - improving CKD III, baseline Cr 1.8 to 2.1 Hypokalemia, resolved Hypernatremia P:   Monitor BMET intermittently Monitor I/Os Correct electrolytes as indicated  GASTROINTESTINAL A:   Hx of GERD, chronic PPI use Suspected post extubation dysphagia P:   SUP: PO PPI DII diet  per SLP  HEMATOLOGIC A:  Acute blood loss anemia, no further blood loss Thrombocytopenia, resolved P: DVT px: full LMWH for PAF Monitor CBC intermittently  INFECTIOUS A:   Surgical wound infection, L groin P:   All micro and abx reviewed Cont ceftriaxone per VVS  ENDOCRINE A:   H/O "borderline" DM P:   Monitor glu on chem panels Consider resumption of SSI for glu > 180  NEUROLOGIC A:   Acute encephalopathy, improving P:   RASS goal: 0 Minimize sedative/analgesics post extubation   Okay to move out of ICU.  D/c CVL if okay with VVS.   Chesley Mires, MD [place] 06/24/2014, 12:05 PM Pager:  [phone] After 3pm call: [phone]

## 2014-06-24 NOTE — Progress Notes (Signed)
SLP Cancellation Note  Patient Details Name: GERMANY SCAFF MRN: [place]:[id number] DOB: [birth year]   Cancelled treatment:       Reason Eval/Treat Not Completed: Other (comment) (Patient using bedside commode)   Romari Gasparro Meryl 06/24/2014, 4:07 PM

## 2014-06-24 NOTE — Progress Notes (Signed)
Pt transferred to 3South bed five on the monitor.  Pt alert, wife at the bedside, wife states pt has no personal belongings and verified at transfer everything is with the pt. MD at the beside on arrival and vital signs taken in new room.  Pt has no c/o of chest pain.  Report given to [facility].

## 2014-06-24 NOTE — Progress Notes (Signed)
Pt states he wears CPAP at home and may bring it in at a later time. Pt offered to use hospital machine but has declined. Pt encouraged to call RT if pt changes mind. No distress noted at this time.

## 2014-06-24 NOTE — Progress Notes (Signed)
Physical Therapy Treatment Patient Details Name: Barry Lawrence MRN: [id number] DOB: [birth year] Today's Date: July 15, 2014    History of Present Illness Pt adm for AAA repair on 05/30/13. Post op complications include VDRF, renal failure with pt on CVVHD. Extubated 1/26. PMHx of CAD s/p CABG, CKD III, OSA on CPAP, DM, HTN, mild COPD.    PT Comments    Pt moving well with drastic improvement from eval. Pt with much less assist needed but continues to be limited by fatigue. Pt with sats 91-94% on RA throughout mobility with HR 68-78. Pt educated for bil LE HEP, transfers and continued need for mobility daily with nursing.   Follow Up Recommendations  CIR     Equipment Recommendations       Recommendations for Other Services       Precautions / Restrictions Precautions Precautions: Fall    Mobility  Bed Mobility               General bed mobility comments: pt EOB on arrival  Transfers     Transfers: Sit to/from Stand Sit to Stand: Min assist         General transfer comment: cues for hand placement, safety and sequence  Ambulation/Gait Ambulation/Gait assistance: Min guard Ambulation Distance (Feet): 200 Feet Assistive device: Rolling walker (2 wheeled) Gait Pattern/deviations: Step-through pattern;Decreased stride length;Trunk flexed   Gait velocity interpretation: Below normal speed for age/gender General Gait Details: cues for posture, position in RW and directional cues   Stairs            Wheelchair Mobility    Modified Rankin (Stroke Patients Only)       Balance Overall balance assessment: Needs assistance   Sitting balance-Leahy Scale: Good       Standing balance-Leahy Scale: Fair                      Cognition Arousal/Alertness: Awake/alert Behavior During Therapy: WFL for tasks assessed/performed Overall Cognitive Status: Within Functional Limits for tasks assessed                      Exercises General  Exercises - Lower Extremity Long Arc Quad: AROM;Seated;Both;10 reps Hip ABduction/ADduction: AROM;Seated;Both;10 reps Hip Flexion/Marching: AROM;Seated;Both;10 reps Toe Raises: AROM;Seated;Both;10 reps Heel Raises: AROM;Seated;Both;10 reps    General Comments        Pertinent Vitals/Pain Pain Assessment: No/denies pain    Home Living                      Prior Function            PT Goals (current goals can now be found in the care plan section) Progress towards PT goals: Progressing toward goals    Frequency       PT Plan Current plan remains appropriate    Co-evaluation             End of Session Equipment Utilized During Treatment: Gait belt Activity Tolerance: Patient tolerated treatment well Patient left: in chair;with call bell/phone within reach     Time: 0845-0908 PT Time Calculation (min) (ACUTE ONLY): 23 min  Charges:  $Gait Training: 8-22 mins $Therapeutic Exercise: 8-22 mins                    G Codes:      Melford Aase 07-15-2014, 9:13 AM Elwyn Reach, [place]

## 2014-06-25 DIAGNOSIS — J42 Unspecified chronic bronchitis: Secondary | ICD-10-CM

## 2014-06-25 LAB — RENAL FUNCTION PANEL
Albumin: 2.7 g/dL — ABNORMAL LOW (ref 3.5–5.2)
Anion gap: 9 (ref 5–15)
BUN: 49 mg/dL — ABNORMAL HIGH (ref 6–23)
CO2: 29 mmol/L (ref 19–32)
Calcium: 9.1 mg/dL (ref 8.4–10.5)
Chloride: 96 mmol/L (ref 96–112)
Creatinine, Ser: 2.09 mg/dL — ABNORMAL HIGH (ref 0.50–1.35)
GFR calc Af Amer: 36 mL/min — ABNORMAL LOW (ref >90)
GFR calc non Af Amer: 31 mL/min — ABNORMAL LOW (ref >90)
Glucose, Bld: 93 mg/dL (ref 70–99)
Phosphorus: 3.6 mg/dL (ref 2.3–4.6)
Potassium: 4 mmol/L (ref 3.5–5.1)
Sodium: 134 mmol/L — ABNORMAL LOW (ref 135–145)

## 2014-06-25 LAB — GLUCOSE, CAPILLARY: Glucose-Capillary: 97 mg/dL (ref 70–99)

## 2014-06-25 MED ORDER — POTASSIUM CHLORIDE CRYS ER 20 MEQ PO TBCR
20.0000 meq | EXTENDED_RELEASE_TABLET | Freq: Every day | ORAL | Status: DC
  Administered 2014-06-25 – 2014-06-28 (×4): 20 meq via ORAL
  Filled 2014-06-25 (×3): qty 1

## 2014-06-25 MED ORDER — CLONIDINE HCL 0.1 MG PO TABS
0.1000 mg | ORAL_TABLET | ORAL | Status: DC | PRN
  Filled 2014-06-25: qty 1

## 2014-06-25 MED ORDER — AMLODIPINE BESYLATE 10 MG PO TABS
10.0000 mg | ORAL_TABLET | Freq: Every day | ORAL | Status: DC
  Administered 2014-06-25 – 2014-06-28 (×4): 10 mg via ORAL
  Filled 2014-06-25 (×4): qty 1

## 2014-06-25 MED ORDER — BUDESONIDE 0.5 MG/2ML IN SUSP
0.5000 mg | Freq: Two times a day (BID) | RESPIRATORY_TRACT | Status: DC
  Administered 2014-06-25 – 2014-06-26 (×2): 0.5 mg via RESPIRATORY_TRACT
  Filled 2014-06-25: qty 4
  Filled 2014-06-25: qty 2
  Filled 2014-06-25: qty 4
  Filled 2014-06-25: qty 2

## 2014-06-25 NOTE — Progress Notes (Signed)
PULMONARY / CRITICAL CARE MEDICINE   Name: Barry Lawrence MRN: [id number] DOB: 1945/11/18   LOS 26 days   ADMISSION DATE:  05/30/2014 CONSULTATION DATE:  05/30/2014  REFERRING MD :  [name]   CHIEF COMPLAINT:  Vent management   INITIAL PRESENTATION:  69 yo male smoker for repair of AAA.  Remained on vent post-op.  PMHx of CAD s/p CABG, CKD III, OSA on CPAP, DM, HTN, mild COPD.   SIGNIFICANT EVENTS: 1/07 OR>> ABF bypass graft, L renal artery bypass 1/10 transfuse 2 units PRBC, renal consulted 1/13 CRRT initiated.  1/16 EXTUBATED 1/16 CRRT discontinued due to improving renal function and Uo 1/17 re- intubated urgently 1/18 bradycardia from precedex >> change to diprivan 1/19 Fever >> add Abx 06/13/14: Tolerating pressure support, but RR still high. 1/22 PSV 6 hours, CVL changed 1/23 PSV most of day, follows commands 1/25 Sister and brother in law updated. Advised to proceed with trach tube placement. They agree. Consent obtained. Trach tube placement scheduled for 1/26 1/26 passed SBT and extubated. Appeared to be tolerating initially. Trach tube canceled 1/27 Tolerating extubation. SLP eval ordered. PT ordered 1/27 CTAP: No significant acute findings 1/28 Intermittent BiPAP for respiratory distress.  1/28 Still requiring intermittent BiPAP for respiratory distress/wheezing  SUBJECTIVE  c/o feeling queasy  'pressure on lungs' does not want to eat much  no abd pain BM ok  VITAL SIGNS: Temp:  [97.4 F (36.3 C)-98 F (36.7 C)] 97.6 F (36.4 C) (02/02 0700) Pulse Rate:  [65-81] 80 (02/02 0412) Resp:  [21-35] 25 (02/02 0412) BP: (134-178)/(73-92) 134/92 mmHg (02/02 0412) SpO2:  [92 %-98 %] 96 % (02/02 0812)   INTAKE / OUTPUT:  Intake/Output Summary (Last 24 hours) at 06/25/14 1104 Last data filed at 06/25/14 0713  Gross per 24 hour  Intake    590 ml  Output   2051 ml  Net  -1461 ml   PHYSICAL EXAMINATION: Gen: lying in bed HEENT: NCAT, WNL PULM minimal  wheezes CV: RRR s M AB: midline scar well healed, BS+, soft Ext: warm, bilateral groin surgical wounds open, clean Neuro: no focal deficits noted  LABS: CBC Recent Labs     06/23/14  0425  06/24/14  0430  WBC  8.5  8.5  HGB  9.8*  10.3*  HCT  31.8*  32.4*  PLT  327  320    BMET Recent Labs     06/23/14  0425  06/24/14  0430  06/25/14  0250  NA  136  137  134*  K  4.1  4.0  4.0  CL  98  99  96  CO2  29  31  29   BUN  62*  57*  49*  CREATININE  2.39*  2.24*  2.09*  GLUCOSE  94  104*  93    Electrolytes Recent Labs     06/23/14  0425  06/24/14  0430  06/25/14  0250  CALCIUM  9.1  9.1  9.1  PHOS  4.7*  4.4  3.6   Liver Enzymes Recent Labs     06/23/14  0425  06/24/14  0430  06/25/14  0250  AST   --   22   --   ALT   --   30   --   ALKPHOS   --   75   --   BILITOT   --   0.7   --   ALBUMIN  2.2*  2.4*  2.7*   Glucose  Recent Labs     06/23/14  0810  06/23/14  1607  06/24/14  0042  06/24/14  0805  06/24/14  1615  06/24/14  2330  GLUCAP  89  91  80  91  85  82    Imaging No results found.    ASSESSMENT / PLAN:  PULMONARY ETT 01/07 >>>1/16, 1/17>> 1/26 A: Hx of OSA, COPD Recurrent acute hypoxic respiratory failure Pulm edema, resolved Recurrent bronchospasm   P:    Cont supp O2 Cont nebulized BDs Cont nebulized steroids  CARDIOVASCULAR R [place] 01/07 >> 1/09 Rt [place] CVL 1/22 >>  Lt IJ HD cath 1/14 >> out A:  S/P AAA repair  Hypovolemic/hemorrhagic shock, resolved New onset PAF/flutter Hx of CAD s/p CABG, HTN, HLD, PAD P:  Cont current Rx Cont full dose LMWH  Can d/c CVL if okay with primary team  RENAL A:   AKI, nonoliguric - improving CKD III, baseline Cr 1.8 to 2.1 Hypokalemia, resolved Hypernatremia P:   Monitor BMET intermittently Monitor I/Os Correct electrolytes as indicated  GASTROINTESTINAL A:   Hx of GERD, chronic PPI use Suspected post extubation dysphagia P:   SUP: PO PPI DII diet per  SLP  HEMATOLOGIC A:  Acute blood loss anemia, no further blood loss Thrombocytopenia, resolved P: DVT px: full LMWH for PAF Monitor CBC intermittently  INFECTIOUS A:   Surgical wound infection, L groin P:   All micro and abx reviewed Cont ceftriaxone per VVS  ENDOCRINE A:   H/O "borderline" DM P:   Monitor glu on chem panels Consider resumption of SSI for glu > 180  NEUROLOGIC A:   Acute encephalopathy, improving P:   RASS goal: 0 Minimize sedative/analgesics post extubation    D/c CVL if okay with VVS. PCCM will follow for COPD mx  Shivam Mestas V. MD   06/25/2014, 11:04 AM

## 2014-06-25 NOTE — Progress Notes (Signed)
ANTICOAGULATION CONSULT NOTE - Follow Up Consult  Pharmacy Consult for LMWH Indication: atrial fibrillation  Allergies  Allergen Reactions  . Niaspan [Niacin Er] Hives   Assessment: 69 yo M admitted 05/30/2014 S/p peripheral bypass surgery.  Coag/Heme: Post-op afib - CHADSvasc 4, CBC low, stable  Stopped coumadin 1/17 due to wound healing issues and changed to full dose lovenox, 1/21: 4h anti-xa level is 0.4 - surprisingly low> increased to 120mg  (1.3mg /kg) daily (Goal 0.5-1.2) recheck 1/24 was 0.73. Held lovenox 1/26 for trach, resumed 1/27. LMWH level 1/29 0.6 (4.5 hrs post-dose) remains within goal range)  Goal of Therapy:  Anti-Xa level 0.6-1 units/ml 4hrs after LMWH dose given Monitor platelets by anticoagulation protocol: Yes   Plan:  Continue Lovenox 120 mg sq Q 24 hours  Anti-Xa level later this week  Day # 11 of Rocephin -- What is LOT?  Add a stop date?     Labs:  Recent Labs  06/23/14 0425 06/24/14 0430 06/25/14 0250  HGB 9.8* 10.3*  --   HCT 31.8* 32.4*  --   PLT 327 320  --   CREATININE 2.39* 2.24* 2.09*    Estimated Creatinine Clearance: 36 mL/min (by C-G formula based on Cr of 2.09).   Thank you. Anette Guarneri, PharmD   06/25/2014,11:16 AM

## 2014-06-25 NOTE — Progress Notes (Signed)
Speech Language Pathology Treatment: Dysphagia  Patient Details Name: Barry Lawrence MRN: [place]:[id number] DOB: 10-25-1945 Today's Date: 06/25/2014 Time: [id number] SLP Time Calculation (min) (ACUTE ONLY): 12 min  Assessment / Plan / Recommendation Clinical Impression  Pt with much improved MS since last SLP session.  Lungs clear.  Initially with overt s/s of aspiration due to large, successive boluses of thin liquid.  Min verbal cues to consume single sips from a straw - coughing eliminated.  Pt with good recall of precautions.  Recommend advancing diet to dysphagia 3, thin liquids; meds whole with puree.  Pt c/o no appetite - advanced diet may promote better intake with greater variety.  SLP to follow    HPI HPI: 69 yo male smoker for repair of AAA. Remained on vent post-op. PMHx of CAD s/p CABG, CKD III, OSA on CPAP, DM, HTN, mild COPD.  Intubated from 1/7 to 1/15.  Swallow eval 1/16 with recs for NPO and concerns for laryngeal deficit; reintubated 1/17-1/26.     Pertinent Vitals Pain Assessment: No/denies pain  SLP Plan  Continue with current plan of care    Recommendations Diet recommendations: Dysphagia 3 (mechanical soft);Thin liquid Liquids provided via: Straw Medication Administration: Whole meds with puree Supervision: Patient able to self feed;Full supervision/cueing for compensatory strategies Compensations: Slow rate;Small sips/bites;Multiple dry swallows after each bite/sip Postural Changes and/or Swallow Maneuvers: Seated upright 90 degrees              Oral Care Recommendations: Oral care BID Plan: Continue with current plan of care    Vivianna Piccini L. Tivis Ringer, [place] CCC/SLP Pager [phone]      Juan Quam Laurice 06/25/2014, 9:55 AM

## 2014-06-25 NOTE — Progress Notes (Signed)
S:  No new CO O:BP 134/92 mmHg  Pulse 80  Temp(Src) 97.6 F (36.4 C) (Oral)  Resp 25  Ht 5\' 7"  (1.702 m)  Wt 88.9 kg (195 lb 15.8 oz)  BMI 30.69 kg/m2  SpO2 92%  Intake/Output Summary (Last 24 hours) at 06/25/14 0810 Last data filed at 06/25/14 [id number]  Gross per 24 hour  Intake    712 ml  Output   2351 ml  Net  -1639 ml   Weight change:  Gen: Awake and alert CVS: Irreg, irreg Resp: Decreased BS bases with few basilar crackles Abd: + BS ND, minimal tenderness around wound Ext: tr edema NEURO:CNI OX3 no asterixis   . antiseptic oral rinse  7 mL Mouth Rinse q12n4p  . budesonide  0.25 mg Nebulization 4 times per day  . carvedilol  12.5 mg Oral BID WC  . cefTRIAXone (ROCEPHIN)  IV  1 g Intravenous Q24H  . chlorhexidine  15 mL Mouth Rinse BID  . collagenase   Topical Daily  . darbepoetin (ARANESP) injection - NON-DIALYSIS  100 mcg Subcutaneous Q Wed-1800  . enoxaparin (LOVENOX) injection  120 mg Subcutaneous Q24H  . furosemide  80 mg Oral BID  . ipratropium  0.5 mg Nebulization Q6H  . levalbuterol  0.63 mg Nebulization Q6H  . pantoprazole  40 mg Oral Q1200  . potassium chloride  20 mEq Oral BID  . sodium chloride  10-40 mL Intracatheter Q12H   No results found. BMET    Component Value Date/Time   NA 134* 06/25/2014 0250   K 4.0 06/25/2014 0250   CL 96 06/25/2014 0250   CO2 29 06/25/2014 0250   GLUCOSE 93 06/25/2014 0250   BUN 49* 06/25/2014 0250   CREATININE 2.09* 06/25/2014 0250   CREATININE 2.02* 05/03/2014 0829   CALCIUM 9.1 06/25/2014 0250   GFRNONAA 31* 06/25/2014 0250   GFRAA 36* 06/25/2014 0250   CBC    Component Value Date/Time   WBC 8.5 06/24/2014 0430   RBC 3.48* 06/24/2014 0430   HGB 10.3* 06/24/2014 0430   HCT 32.4* 06/24/2014 0430   PLT 320 06/24/2014 0430   MCV 93.1 06/24/2014 0430   MCH 29.6 06/24/2014 0430   MCHC 31.8 06/24/2014 0430   RDW 15.0 06/24/2014 0430   LYMPHSABS 1.0 06/17/2014 0343   MONOABS 0.6 06/17/2014 0343   EOSABS 0.2  06/17/2014 0343   BASOSABS 0.1 06/17/2014 0343     Assessment:  1. Acute on CKD 3/4 sec ATN following aortobifem and lt renal art bypass, Scr trending to baseline.  Fair UO yest 2. COPD 3. CAD, stable 4. A fib 5. Anemia on aranesp 6. HTN  Plan: 1. Decrease KCl to q d 2. Cont PO lasix 3. Increase ambulation 4. Will resume amlodipine   Kmya Placide T

## 2014-06-25 NOTE — Progress Notes (Addendum)
   [facility] he feels bloated, but had a BM this am.  He has no apatite.   Objective 159/75 67 97 F (36.1 C) (Axillary) 23 97%  Intake/Output Summary (Last 24 hours) at 06/25/14 1709 Last data filed at 06/25/14 1056  Gross per 24 hour  Intake    440 ml  Output   1550 ml  Net  -1110 ml    Palpable DP pulses bil. Left groin > right groin beefy red walls, yellow eschar decreasing.  No graft viable RA O2 SAT 92% Lungs CTA Heart ireegular rate controlled  Assessment/Planning: POD #25ABF, left renal bypass  Lungs 2L [place] Heart rate controlled A fib Lovenox 120 SQ Increasing mobility tolerating sitting up more. Walk 2 times yesterday and once this am without getting short of breath. 5-60 feet. Cr decreasing slowly now 2.24. UO average 200 cc/hr. Still receiving 80 mg Lasix BID. Will transfer to 3S today Increase mobility work toward in patient rehab verses SNF.  Consult from CIR states he is too high level.   Laurence Slate [facility] 06/25/2014 5:09 PM --  Laboratory Lab Results:  Recent Labs  06/23/14 0425 06/24/14 0430  WBC 8.5 8.5  HGB 9.8* 10.3*  HCT 31.8* 32.4*  PLT 327 320   BMET  Recent Labs  06/24/14 0430 06/25/14 0250  NA 137 134*  K 4.0 4.0  CL 99 96  CO2 31 29  GLUCOSE 104* 93  BUN 57* 49*  CREATININE 2.24* 2.09*  CALCIUM 9.1 9.1    COAG Lab Results  Component Value Date   INR 1.21 06/18/2014   INR 1.34 06/09/2014   INR 1.21 06/04/2014   No results found for: PTT    I agree with the above CV:  Discuss with cardiology duration of anticoagulation.  Are they ok with Jennye Moccasin, or is coumadin better.  OK to start PO anticoagulation ID:  Is CCM ok with stopping Rocephin PO:  Still no appetite. Is medication warrented now, will considerappetite stimulant Dispo:  PT to eval d/c needs ST:  Can diet be advanced?  Annamarie Major

## 2014-06-25 NOTE — Progress Notes (Signed)
Pt does not wish to go on CPAP at this time. RT will continue to monitor. No distress noted.

## 2014-06-25 NOTE — Progress Notes (Signed)
Utilization review completed.  

## 2014-06-26 LAB — GLUCOSE, CAPILLARY
Glucose-Capillary: 92 mg/dL (ref 70–99)
Glucose-Capillary: 130 mg/dL — ABNORMAL HIGH (ref 70–99)

## 2014-06-26 MED ORDER — WARFARIN SODIUM 7.5 MG PO TABS
7.5000 mg | ORAL_TABLET | Freq: Once | ORAL | Status: AC
  Administered 2014-06-26: 7.5 mg via ORAL
  Filled 2014-06-26: qty 1

## 2014-06-26 MED ORDER — WARFARIN VIDEO
Freq: Once | Status: AC
  Administered 2014-06-26: 18:00:00

## 2014-06-26 MED ORDER — PATIENT'S GUIDE TO USING COUMADIN BOOK
Freq: Once | Status: AC
  Administered 2014-06-26: 18:00:00
  Filled 2014-06-26: qty 1

## 2014-06-26 MED ORDER — CETYLPYRIDINIUM CHLORIDE 0.05 % MT LIQD
7.0000 mL | Freq: Two times a day (BID) | OROMUCOSAL | Status: DC
  Administered 2014-06-27 – 2014-06-28 (×2): 7 mL via OROMUCOSAL

## 2014-06-26 MED ORDER — WARFARIN - PHARMACIST DOSING INPATIENT
Freq: Every day | Status: DC
  Administered 2014-06-27: 18:00:00

## 2014-06-26 NOTE — Progress Notes (Signed)
SUBJECTIVE: The patient has continued in atrial fibrillation. The rate has been controlled. The atrial fibrillation started during this hospitalization. He has been in atrial fibrillation since at least June 06, 2014. He has been anticoagulated with full dose low molecular weight heparin. It is now time to make plans for outpatient therapy. The prior notes suggest that the plan would be to switch him to Coumadin.  The patient is weak but stable today.   Filed Vitals:   06/26/14 0326 06/26/14 0808 06/26/14 0915 06/26/14 0924  BP: 130/69 130/89    Pulse: 74 90    Temp: 97.7 F (36.5 C) 97.5 F (36.4 C)    TempSrc: Oral Oral    Resp: 19 17    Height:      Weight:      SpO2: 94% 92% 90% 96%     Intake/Output Summary (Last 24 hours) at 06/26/14 1210 Last data filed at 06/26/14 0530  Gross per 24 hour  Intake    720 ml  Output    850 ml  Net   -130 ml    LABS: Basic Metabolic Panel:  Recent Labs  06/24/14 0430 06/25/14 0250  NA 137 134*  K 4.0 4.0  CL 99 96  CO2 31 29  GLUCOSE 104* 93  BUN 57* 49*  CREATININE 2.24* 2.09*  CALCIUM 9.1 9.1  PHOS 4.4 3.6   Liver Function Tests:  Recent Labs  06/24/14 0430 06/25/14 0250  AST 22  --   ALT 30  --   ALKPHOS 75  --   BILITOT 0.7  --   PROT 5.9*  --   ALBUMIN 2.4* 2.7*   No results for input(s): LIPASE, AMYLASE in the last 72 hours. CBC:  Recent Labs  06/24/14 0430  WBC 8.5  HGB 10.3*  HCT 32.4*  MCV 93.1  PLT 320   Cardiac Enzymes: No results for input(s): CKTOTAL, CKMB, CKMBINDEX, TROPONINI in the last 72 hours. BNP: Invalid input(s): POCBNP D-Dimer: No results for input(s): DDIMER in the last 72 hours. Hemoglobin A1C: No results for input(s): HGBA1C in the last 72 hours. Fasting Lipid Panel: No results for input(s): CHOL, HDL, LDLCALC, TRIG, CHOLHDL, LDLDIRECT in the last 72 hours. Thyroid Function Tests: No results for input(s): TSH, T4TOTAL, T3FREE, THYROIDAB in the last 72  hours.  Invalid input(s): FREET3  RADIOLOGY: Ct Abdomen Pelvis Wo Contrast  06/19/2014   CLINICAL DATA:  Initial evaluation for abdominal swelling and possible intraperitoneal air seen on chest radiograph performed today, aortic aneurysm repair 05/30/14  EXAM: CT ABDOMEN AND PELVIS WITHOUT CONTRAST  TECHNIQUE: Multidetector CT imaging of the abdomen and pelvis was performed following the standard protocol without IV contrast.  COMPARISON:  06/19/2014 chest radiographs, 03/05/2014 CT scan  FINDINGS: Mild cardiac enlargement. Status post CABG. Small bilateral pleural effusions with myeloma underlying mild atelectasis.  NG tube passes into the duodenum at the level of the descending and third portions of the duodenum. Liver is normal. There are multiple gallstones in total measuring 3.2 cm. Gallbladder is not abnormally distended. No significant pericholecystic inflammation.  Spleen is normal. Pancreas is normal. 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 Kidneys are normal.  There is calcification of the abdominal aorta. There is distal abdominal aortic aneurysm. Maximal diameter is 48 x 39 mm, notably smaller when compared to prior study.The patient is status post aortobifemoral bypass since the prior CT. There is inflammatory change in the retroperitoneum surrounding the aorta at the level of the  bypass graft and extending inferiorly to below the bifurcation. Inflammatory change extends mildly around both perinephric spaces. There is a small volume of free fluid in the pelvis.  Bladder is normal except for small amount of air within it which is likely related to the observed Foley catheter.  Nonobstructive bowel gas pattern with oral contrast throughout the bowel. Appendix, large bowel, small bowel, and stomach are normal. No evidence of pneumoperitoneum or pneumatosis intestinalis.  IMPRESSION: 1. No evidence of pneumoperitoneum. 2. Inflammatory change surrounding the distal abdominal aorta extending into  both perinephric spaces. Patient is status aortobifemoral bypass and aneurysm repair, and the findings may simply represent postoperative change. The integrity of the repair would require iv contrast to assess further. 3. Numerous nonacute findings as discussed above.   Electronically Signed   By: Skipper Cliche M.D.   On: 06/19/2014 17:46   Dg Chest Port 1 View  06/19/2014   CLINICAL DATA:  Follow-up of respiratory failure  EXAM: PORTABLE CHEST - 1 VIEW  COMPARISON:  Portable chest x-ray of June 17, 2014  FINDINGS: The lungs are well expanded. The pulmonary interstitial markings remain increased but have improved. The cardiac silhouette is mildly enlarged though stable. The pulmonary vascularity remains prominent centrally with mild cephalization.  The endotracheal tube has been removed. The right subclavian venous catheter tip projects over the distal third of the SVC. The esophagogastric tube tip projects below the inferior margin of the image. There are 6 intact sternal wires present from previous CABG.  A small amount of free air under the right hemidiaphragm is noted.  IMPRESSION: 1. Slight improvement in the appearance of the pulmonary interstitium consistent with resolving interstitial edema. The remaining support tubes and lines are in appropriate position. 2. There is a tiny crescentic lucency which may reflect free air under the right hemidiaphragm. An acute abdominal series or abdominal and pelvic CT scan would be useful. 3. These results were called by telephone at the time of interpretation on 06/19/2014 at 8:18 am to Lyla Son, RN,who verbally acknowledged these results.   Electronically Signed   By: Sotirios  Martinique   On: 06/19/2014 08:20   Dg Chest Port 1 View  06/17/2014   CLINICAL DATA:  Respiratory distress  EXAM: PORTABLE CHEST - 1 VIEW  COMPARISON:  06/15/2014  FINDINGS: Left jugular central venous catheter removed. Tubular devices are otherwise stable. Past congestion is worse.  Opacity at the left base is worse. Cardiomegaly. No pneumothorax.  IMPRESSION: Worsening vascular congestion and left basilar consolidation.   Electronically Signed   By: Maryclare Bean M.D.   On: 06/17/2014 07:53   Dg Chest Port 1 View  06/15/2014   CLINICAL DATA:  Acute respiratory failure with hypoxia  EXAM: PORTABLE CHEST - 1 VIEW  COMPARISON:  Radiograph 06/14/2014  FINDINGS: Endotracheal tube 3.6 cm from carina. Interval removal of left IJ line. Right subclavian line with tip in the right atrium is unchanged. Left IJ line with tip in the distal SVC is unchanged. Stable cardiac silhouette. Mild basilar atelectasis is present. Mild airspace disease bases suggesting edema.  IMPRESSION: 1. New right IJ line. 2. Left subclavian line remains with tip in the right atrium. 3. Mild basilar airspace disease suggesting edema.   Electronically Signed   By: Suzy Bouchard M.D.   On: 06/15/2014 09:09   Dg Chest Port 1 View  06/14/2014   CLINICAL DATA:  Central line placement  EXAM: PORTABLE CHEST - 1 VIEW  COMPARISON:  06/14/2014  FINDINGS:  Cardiomediastinal silhouette is stable. Status post median sternotomy. Stable endotracheal and NG tube position. Stable bilateral IJ central line with tip in SVC right atrium junction. There is a new right subclavian central line with tip in right atrium. For distal SVC position should be retracted about 5 cm. No pneumothorax. Residual mild interstitial edema with improvement in aeration.  IMPRESSION: Stable bilateral IJ central line. Stable endotracheal and NG tube position. New right subclavian central line with tip in right atrium. No pneumothorax. Residual mild interstitial prominence with improvement in aeration.   Electronically Signed   By: Lahoma Crocker M.D.   On: 06/14/2014 17:07   Dg Chest Port 1 View  06/14/2014   CLINICAL DATA:  Hypoxia  EXAM: PORTABLE CHEST - 1 VIEW  COMPARISON:  June 13, 2014  FINDINGS: Endotracheal tube tip is 4.5 cm above the carina. Nasogastric  tube tip and side port are below the diaphragm. Central catheter tips are in the superior vena cava, stable. No pneumothorax. Mild generalized interstitial edema remains. There is no airspace consolidation. No new opacity. Heart is borderline enlarged with pulmonary vascularity within normal limits. No adenopathy.  IMPRESSION: Tube and catheter positions as described without pneumothorax. Heart slightly prominent with mild edema. Question a degree of underlying congestive heart failure. No airspace consolidation.   Electronically Signed   By: Lowella Grip M.D.   On: 06/14/2014 07:58   Dg Chest Port 1 View  06/13/2014   CLINICAL DATA:  Ventilator dependent respiratory failure  EXAM: PORTABLE CHEST - 1 VIEW  COMPARISON:  Portable exam 0644 hr compared to 06/12/2014  FINDINGS: Tip of endotracheal tube projects 4.7 cm above carina.  Tips of BILATERAL jugular catheters project over SVC.  Nasogastric tube extends into stomach.  Normal heart size post median sternotomy and CABG.  Atherosclerotic calcification aorta.  Pulmonary vascular congestion with scattered interstitial prominence bilaterally question mild pulmonary edema.  No pleural effusion or pneumothorax.  Bones demineralized.  IMPRESSION: Stable line and tube positions.  Probable mild pulmonary edema.   Electronically Signed   By: Lavonia Dana M.D.   On: 06/13/2014 07:56   Dg Chest Port 1 View  06/12/2014   CLINICAL DATA:  Ventilator dependent respiratory failure  EXAM: PORTABLE CHEST - 1 VIEW  COMPARISON:  06/11/2014  FINDINGS: Endotracheal tube, left IJ dialysis catheter, right IJ central venous catheter, and nasogastric tube are stable in position. Previous CABG. Heart size upper limits normal for technique. Mild central vascular congestion persists. No definite effusion. Atheromatous aorta. Patchy atelectasis or infiltrate in the right mid lung has developed since previous exam.  IMPRESSION: 1. New right mid lung atelectasis or infiltrate. 2.   Support hardware stable in position.   Electronically Signed   By: Arne Cleveland M.D.   On: 06/12/2014 08:08   Dg Chest Port 1 View  06/11/2014   CLINICAL DATA:  Respiratory failure.  EXAM: PORTABLE CHEST - 1 VIEW  COMPARISON:  06/10/2014.  FINDINGS: Endotracheal tube, bilateral IJ lines, NG tube in stable position. Stable cardiomegaly. Prior CABG. Interim partial clearing of pulmonary interstitial prominence suggesting clearing pulmonary interstitial edema. Small left pleural effusion cannot be excluded. No pneumothorax.  IMPRESSION: 1. Lines and tubes in stable position. 2. Interim partial clearing of pulmonary interstitial edema. Small left pleural effusion cannot be excluded. 3. Stable cardiomegaly.  Prior CABG.   Electronically Signed   By: Marcello Moores  Register   On: 06/11/2014 07:38   Dg Chest Port 1 View  06/10/2014   CLINICAL DATA:  Respiratory failure/hypoxia  EXAM: PORTABLE CHEST - 1 VIEW  COMPARISON:  June 09, 2014  FINDINGS: Endotracheal tube tip is 6.7 cm above the carina. Central catheter tips are in the superior vena cava. Nasogastric tube tip and side port is below the diaphragm. No pneumothorax. There is atelectatic change in the left lower lobe. Elsewhere lungs are clear. Heart is mildly enlarged with pulmonary vascularity within normal limits. No adenopathy. Patient is status post coronary artery bypass grafting.  IMPRESSION: Tube and catheter positions as described without pneumothorax. Left lower lobe atelectatic change. No change in cardiac silhouette. No new opacity.   Electronically Signed   By: Lowella Grip M.D.   On: 06/10/2014 07:29   Portable Chest Xray  06/09/2014   CLINICAL DATA:  Acute onset of shortness of breath. Respiratory failure. Endotracheal tube placement. Initial encounter.  EXAM: PORTABLE CHEST - 1 VIEW  COMPARISON:  Chest radiograph performed 06/08/2014  FINDINGS: The patient's endotracheal tube is seen ending 4 cm above the carina. A right IJ line is noted  ending about the mid SVC. A left IJ line is also noted ending about the mid SVC. An enteric tube is noted extending below the diaphragm.  Persistent retrocardiac airspace opacity raises concern for pneumonia, though mild interstitial edema might have a similar appearance. Increased interstitial markings are seen. No pleural effusion or pneumothorax is identified.  The cardiomediastinal silhouette is mildly enlarged. The patient is status post median sternotomy, with evidence of prior CABG. No acute osseous abnormalities are identified.  IMPRESSION: 1. Endotracheal tube seen ending 4 cm above the carina. 2. Persistent retrocardiac airspace opacity and increased interstitial markings may reflect pneumonia or possibly mild interstitial edema. This is perhaps mildly more prominent than on the prior study. 3. Mild cardiomegaly.   Electronically Signed   By: Garald Balding M.D.   On: 06/09/2014 05:33   Dg Chest Port 1 View  06/08/2014   CLINICAL DATA:  Respiratory distress.  Shortness of breath.  Cough.  EXAM: PORTABLE CHEST - 1 VIEW  COMPARISON:  Chest radiograph 06/06/2014 at 0628 hr  FINDINGS: The endotracheal tube is no longer seen. The enteric tube remains in place, tip below the diaphragm not included in the field of view. Left and right central lines remain in the mid SVC. Patient is post median sternotomy. There is stable cardiomegaly. Decreased atelectasis and hazy opacity at the right lung base. Persistent left basilar retrocardiac opacity. Pulmonary vasculature is stable, no overt edema.  IMPRESSION: 1. Increasing right basilar opacity with decreasing atelectasis. 2. Stable cardiomegaly and left retrocardiac opacity, atelectasis versus pneumonia.   Electronically Signed   By: Jeb Levering M.D.   On: 06/08/2014 02:55   Dg Chest Port 1 View  06/06/2014   CLINICAL DATA:  69 year old male with respiratory failure, recent operative aortobifemoral bypass graft, left renal artery bypass. Initial encounter.   EXAM: PORTABLE CHEST - 1 VIEW  COMPARISON:  06/05/2014 and earlier.  FINDINGS: Portable AP semi upright view at 0628 hrs. Stable endotracheal tube. Stable bilateral are IJ vascular catheters. Enteric tube courses to the abdomen, tip not included.  Stable lung volumes. Stable cardiac size and mediastinal contours. Sequelae of CABG. No pneumothorax. Continued confluent retrocardiac opacity with no large pleural effusion. Superimposed perihilar basilar predominant streaky opacity, no overt edema.  IMPRESSION: 1.  Stable lines and tubes. 2. Stable ventilation with streaky perihilar opacity superimposed on lower lobe collapse or consolidation.   Electronically Signed   By: Lezlie Octave.D.  On: 06/06/2014 07:46   Dg Chest Port 1 View  06/05/2014   CLINICAL DATA:  Post left IJ dialysis catheter insertion.  EXAM: PORTABLE CHEST - 1 VIEW  COMPARISON:  06/05/2014  FINDINGS: Left dialysis catheter placement with the tip in the SVC. Endotracheal tube, right central line, and NG tube are unchanged. There is cardiomegaly with vascular congestion. Bilateral lower lobe airspace opacities are again noted, stable. Possible small left effusion, stable. No right effusion.  IMPRESSION: Left dialysis catheter placement with the tip in the SVC. No pneumothorax. Otherwise no real change.   Electronically Signed   By: Rolm Baptise M.D.   On: 06/05/2014 15:06   Dg Chest Port 1 View  06/05/2014   CLINICAL DATA:  Intubated  EXAM: PORTABLE CHEST - 1 VIEW  COMPARISON:  06/04/2014  FINDINGS: Cardiomediastinal silhouette is stable. Again noted status post median sternotomy and CABG. Endotracheal tube in place with tip 4.5 cm above the carina. Stable NG tube position. Persistent small left pleural effusion with left basilar atelectasis or infiltrate. Central mild vascular congestion without convincing pulmonary edema. Right IJ central line is unchanged in position.  IMPRESSION: Stable support apparatus. Persistent small left pleural effusion  with left basilar atelectasis or infiltrate. Central mild vascular congestion without convincing pulmonary edema.   Electronically Signed   By: Lahoma Crocker M.D.   On: 06/05/2014 09:01   Dg Chest Port 1 View  06/04/2014   CLINICAL DATA:  Ventilator dependent respiratory failure.  EXAM: PORTABLE CHEST - 1 VIEW  COMPARISON:  06/03/2014  FINDINGS: Patient slightly rotated left. Sternotomy wires unchanged. Endotracheal tube has tip 5.4 cm above the carinal. Enteric tube courses into the region of the stomach and off the inferior portion of the film. Right IJ venous catheter unchanged with tip overlying SVC.  Persistent opacification over the left base likely a combination of pleural fluid and atelectasis although cannot exclude infection. Improved aeration over the right base. Stable mild cardiomegaly. Remainder of the exam is unchanged.  IMPRESSION: Persistent left base opacification likely effusion with atelectasis although cannot exclude infection.  Tubes and lines as described.   Electronically Signed   By: Marin Olp M.D.   On: 06/04/2014 08:10   Dg Chest Port 1 View  06/03/2014   CLINICAL DATA:  CHF.  EXAM: PORTABLE CHEST - 1 VIEW  COMPARISON:  06/02/2014.  FINDINGS: Tracheostomy tube, right IJ line, NG tube in stable position. Stable cardiomegaly. Prior CABG. Interim partial clearing of pulmonary interstitial edema. Persistent small left pleural effusion. No pneumothorax.  IMPRESSION: 1. Lines and tubes in stable position. 2. Persistent cardiomegaly. Prior CABG. Interim partial clearing of pulmonary interstitial edema. Persistent small left pleural effusion .   Electronically Signed   By: Marcello Moores  Register   On: 06/03/2014 07:55   Dg Chest Port 1 View  06/02/2014   CLINICAL DATA:  69 year old male intubated ICU patient.  EXAM: PORTABLE CHEST - 1 VIEW  COMPARISON:  Chest x-ray 06/01/2014.  FINDINGS: An endotracheal tube is in place with tip 6.0 cm above the carina. Right IJ central venous catheter with  tip at the superior cavoatrial junction. A nasogastric tube is seen extending into the stomach, however, the tip of the nasogastric tube extends below the lower margin of the image. Lung volumes are low. Cephalization of the pulmonary vasculature with indistinct interstitial markings in lungs bilaterally suggesting a background of mild interstitial pulmonary edema. In addition, there are more focal opacities throughout the right mid to lower lung, concerning  for suture post areas of airspace consolidation, potentially from infection. Bibasilar opacities are also present, presumably atelectatic. Small bilateral pleural effusions. Heart size is mildly enlarged. Upper mediastinal contours are within normal limits for the patient's lordotic positioning and portable technique. Extensive atherosclerosis in the thoracic aorta. Status post median sternotomy for CABG, including LIMA.  IMPRESSION: 1. Postoperative changes and support apparatus, as above. 2. Worsening aeration compared to yesterday's examination, concerning for combination of developing pulmonary edema, and potential superimposed infection in the right mid to lower lung. There are also areas of bibasilar subsegmental atelectasis and small bilateral pleural effusions. 3. Atherosclerosis.   Electronically Signed   By: Vinnie Langton M.D.   On: 06/02/2014 09:18   Dg Chest Port 1 View  06/01/2014   CLINICAL DATA:  Central line complication, initial encounter  EXAM: PORTABLE CHEST - 1 VIEW  COMPARISON:  06/01/2014 at 6:44 a.m.  FINDINGS: Moderate cardiac enlargement. Status post CABG. No change endotracheal tube. No change orogastric tube. Right Swan-Ganz central venous catheter has been retracted and now is seen with tip over the cavoatrial junction.  Vascular congestion identified with persistent right mid lung discoid atelectasis. Mild interstitial prominence suggesting an element of interstitial edema, which represents change from prior study.  IMPRESSION:  Retraction of right Swan-Ganz central venous catheter.  Atelectasis right mid lung zone with development of mild interstitial pulmonary edema.   Electronically Signed   By: Skipper Cliche M.D.   On: 06/01/2014 20:59   Dg Chest Port 1 View  06/01/2014   CLINICAL DATA:  Acute respiratory failure and hypoxia. Subsequent encounter.  EXAM: PORTABLE CHEST - 1 VIEW  COMPARISON:  Chest radiograph performed 05/31/2014  FINDINGS: The patient's endotracheal tube is seen ending 5 cm above the carina. An enteric tube is seen extending below the diaphragm. A right IJ Swan-Ganz catheter is seen ending about the right main pulmonary artery.  There has been interval improvement in right upper lobe airspace opacification, reflecting improving pneumonia. No pleural effusion or pneumothorax is seen.  The cardiomediastinal silhouette is mildly enlarged. The patient is status post mid sternotomy, with evidence of prior CABG. No acute osseous abnormalities are identified.  IMPRESSION: Interval improvement in right upper lobe pneumonia.   Electronically Signed   By: Garald Balding M.D.   On: 06/01/2014 08:24   Dg Chest Port 1 View  05/31/2014   CLINICAL DATA:  Status post aortofemoral bypass graft.  EXAM: PORTABLE CHEST - 1 VIEW  COMPARISON:  May 30, 2014.  FINDINGS: Status post coronary artery bypass graft. Endotracheal and nasogastric tubes are unchanged in position. Right internal jugular Swan-Ganz catheter is noted with tip directed into the right pulmonary artery. Stable right upper lobe opacity is noted most consistent with right upper lobe atelectasis or pneumonia. No pneumothorax or significant pleural effusion is noted.  IMPRESSION: Stable support apparatus. Stable right upper lobe opacity is noted most consistent with pneumonia or atelectasis of the right upper lobe.   Electronically Signed   By: Sabino Dick M.D.   On: 05/31/2014 08:04   Dg Chest Port 1 View  05/30/2014   CLINICAL DATA:  Evaluate position of Swan-Ganz  catheter after adjustment.  EXAM: PORTABLE CHEST - 1 VIEW  COMPARISON:  05/30/2014  FINDINGS: Postoperative changes in the mediastinum. Endotracheal tube tip measures 4.7 cm above the carinal. Enteric tube tip is off the field of view but below the left hemidiaphragm. Swan-Ganz catheter tip projects over the right main pulmonary artery. Interval development of collapse  or consolidation of the right upper lung. No blunting of costophrenic angles. Left lung is clear. Heart size and pulmonary vascularity are normal. Calcified and tortuous aorta.  IMPRESSION: Appliances appear to be in satisfactory position. Interval development of collapse or consolidation of the right upper lung.   Electronically Signed   By: Lucienne Capers M.D.   On: 05/30/2014 21:23   Portable Chest Xray  05/30/2014   CLINICAL DATA:  Status post aortic aneurysm repair. Respiratory failure.  EXAM: PORTABLE CHEST - 1 VIEW  COMPARISON:  Chest CT 03/05/2014  FINDINGS: Endotracheal tube is 6.4 cm from the carina. Enteric tube in place, tip below the diaphragm not included in the field of view. Right internal jugular Swan-Ganz catheter tip in the region of the pulmonary outflow tract. Patient is post median sternotomy. The heart size is normal. No definite chest tubes are seen. There is no pleural effusion or pneumothorax. Minimal vascular congestion. No pulmonary edema.  IMPRESSION: 1. Endotracheal tube, enteric tube, and Swan-Ganz catheter in place. 2. Minimal vascular congestion.   Electronically Signed   By: Jeb Levering M.D.   On: 05/30/2014 18:09   Dg Abd Portable 1v  06/04/2014   CLINICAL DATA:  Gastric tube placement  EXAM: PORTABLE ABDOMEN - 1 VIEW  COMPARISON:  Portable exam 1854 hr compared to CT abdomen and pelvis 03/05/2014  FINDINGS: Tip of gastric tube projects over expected position of the proximal descending duodenum.  Bowel gas pattern normal.  Prior median sternotomy.  Opacified LEFT lung base.  Osseous demineralization.   IMPRESSION: Tip of gastric tube projects over expected position of the proximal descending duodenum.   Electronically Signed   By: Lavonia Dana M.D.   On: 06/04/2014 19:24    PHYSICAL EXAM   the patient is weak but stable. He is oriented to person time and place. Affect is normal. Head is atraumatic. Lungs reveal scattered rhonchi. Cardiac exam reveals S1 and S2. The rhythm is irregularly irregular. The rate is controlled. Is no significant peripheral edema.   TELEMETRY: I have personally reviewed telemetry today June 26, 2014. There is atrial fibrillation with a controlled rate.   ASSESSMENT AND PLAN:    AAA (abdominal aortic aneurysm)   Acute respiratory failure with hypoxia   Acute renal failure     The patient is recovering from a long postoperative course after abdominal aortic aneurysm repair and repair of her renal artery. He is slowly recovering.      Atrial fibrillation     The patient has been in atrial fibrillation for several weeks. Currently he is on low molecular weight heparin at a full dose. At this point he needs longer-term anticoagulation. The most appropriate drug for him at this time is Coumadin. I will write an order to ask pharmacy to begin to dose his Coumadin. When he leaves the hospital this will have to be followed carefully by either Dr. Gwenlyn Found and the team at the [facility] office or at the [facility].   Dola Argyle 06/26/2014 12:10 PM

## 2014-06-26 NOTE — Progress Notes (Signed)
Physical Therapy Treatment Patient Details Name: Barry Lawrence MRN: [id number] DOB: Jun 05, 1945 Today's Date: 06/26/2014    History of Present Illness Pt adm for AAA repair on 05/30/13. Post op complications include VDRF, renal failure with pt on CVVHD. Extubated 1/26. PMHx of CAD s/p CABG, CKD III, OSA on CPAP, DM, HTN, mild COPD.    PT Comments    Patient progressing slowly with mobility. Requires less assist for transitions today however pt easily fatigued requiring frequent short standing rest breaks and abnormal increase in HR with activity. Pt required supplemental 02 during activity today due to drop in Sa02 on RA. Not able to tolerate increased distance today due to fatigue and elevated HR. Pt denied CIR however would benefit from ST SNF to improve overall functional mobility prior to return home. Will continue to follow.   Follow Up Recommendations  SNF;Supervision/Assistance - 24 hour     Equipment Recommendations  None recommended by PT    Recommendations for Other Services       Precautions / Restrictions Precautions Precautions: Fall Precaution Comments: monitor 02, HR Restrictions Weight Bearing Restrictions: No    Mobility  Bed Mobility               General bed mobility comments: received sitting in chair upon PT arrival.   Transfers Overall transfer level: Needs assistance Equipment used: Rolling walker (2 wheeled) Transfers: Sit to/from Stand Sit to Stand: Min guard         General transfer comment: Min guard to stand from chair x2, from toilet x1.   Ambulation/Gait Ambulation/Gait assistance: Min guard Ambulation Distance (Feet): 100 Feet Assistive device: Rolling walker (2 wheeled) Gait Pattern/deviations: Step-through pattern;Decreased stride length;Trunk flexed   Gait velocity interpretation: Below normal speed for age/gender General Gait Details: Pt with slow, mildly unsteady gait. Cues for RW proximity and upright posture. Multiple short  standing rest breaks due to fatigue and elevated HR. HR ranged from 90s-140s during gait. Sa02 maintained >91% on 1L 02 [place]. Sa02 dropped to 86% on RA when ambulating to bathroom.   Stairs            Wheelchair Mobility    Modified Rankin (Stroke Patients Only)       Balance Overall balance assessment: Needs assistance Sitting-balance support: Feet supported;No upper extremity supported Sitting balance-Leahy Scale: Good     Standing balance support: During functional activity Standing balance-Leahy Scale: Fair Standing balance comment: Able to perform hand washing without UE support for short periods however requires UE support during gait training for distance.                    Cognition Arousal/Alertness: Awake/alert Behavior During Therapy: WFL for tasks assessed/performed Overall Cognitive Status: Within Functional Limits for tasks assessed                      Exercises      General Comments        Pertinent Vitals/Pain Pain Assessment: No/denies pain    Home Living                      Prior Function            PT Goals (current goals can now be found in the care plan section) Progress towards PT goals: Progressing toward goals    Frequency  Min 3X/week    PT Plan Discharge plan needs to be updated    Co-evaluation  End of Session Equipment Utilized During Treatment: Gait belt Activity Tolerance: Patient limited by fatigue Patient left: in chair;with call bell/phone within reach     Time: 0937-1004 PT Time Calculation (min) (ACUTE ONLY): 27 min  Charges:  $Gait Training: 23-37 mins                    G CodesCandy Sledge A 13-Jul-2014, 10:27 AM Candy Sledge, [place], DPT [phone]

## 2014-06-26 NOTE — Progress Notes (Signed)
PULMONARY / CRITICAL CARE MEDICINE   Name: Barry Lawrence MRN: [id number] DOB: Jul 01, 1945   LOS 37 days   ADMISSION DATE:  05/30/2014 CONSULTATION DATE:  05/30/2014  REFERRING MD :  [name]   CHIEF COMPLAINT:  Vent management   INITIAL PRESENTATION:  69 yo male smoker for repair of AAA.  Remained on vent post-op.  PMHx of CAD s/p CABG, CKD III, OSA on CPAP, DM, HTN, mild COPD.   SIGNIFICANT EVENTS: 1/07 OR>> ABF bypass graft, L renal artery bypass 1/10 transfuse 2 units PRBC, renal consulted 1/13 CRRT initiated.  1/16 EXTUBATED 1/16 CRRT discontinued due to improving renal function and Uo 1/17 re- intubated urgently 1/18 bradycardia from precedex >> change to diprivan 1/19 Fever >> add Abx 06/13/14: Tolerating pressure support, but RR still high. 1/22 PSV 6 hours, CVL changed 1/23 PSV most of day, follows commands 1/25 Sister and brother in law updated. Advised to proceed with trach tube placement. They agree. Consent obtained. Trach tube placement scheduled for 1/26 1/26 passed SBT and extubated. Appeared to be tolerating initially. Trach tube canceled 1/27 Tolerating extubation. SLP eval ordered. PT ordered 1/27 CTAP: No significant acute findings 1/28 Intermittent BiPAP for respiratory distress.  1/28 Still requiring intermittent BiPAP for respiratory distress/wheezing  SUBJECTIVE Feels much better Out of bed to chair no abd pain BM ok  VITAL SIGNS: Temp:  [97.5 F (36.4 C)-97.8 F (36.6 C)] 97.5 F (36.4 C) (02/03 0808) Pulse Rate:  [74-90] 90 (02/03 0808) Resp:  [17-23] 17 (02/03 0808) BP: (125-153)/(68-89) 130/89 mmHg (02/03 0808) SpO2:  [90 %-97 %] 96 % (02/03 0924)   INTAKE / OUTPUT:  Intake/Output Summary (Last 24 hours) at 06/26/14 1239 Last data filed at 06/26/14 0530  Gross per 24 hour  Intake    720 ml  Output    850 ml  Net   -130 ml   PHYSICAL EXAMINATION: Gen: lying in bed HEENT: NCAT, WNL PULM minimal wheezes CV: RRR s M AB: midline scar  well healed, BS+, soft Ext: warm, bilateral groin surgical wounds open, clean Neuro: no focal deficits noted  LABS: CBC Recent Labs     06/24/14  0430  WBC  8.5  HGB  10.3*  HCT  32.4*  PLT  320    BMET Recent Labs     06/24/14  0430  06/25/14  0250  NA  137  134*  K  4.0  4.0  CL  99  96  CO2  31  29  BUN  57*  49*  CREATININE  2.24*  2.09*  GLUCOSE  104*  93    Electrolytes Recent Labs     06/24/14  0430  06/25/14  0250  CALCIUM  9.1  9.1  PHOS  4.4  3.6   Liver Enzymes Recent Labs     06/24/14  0430  06/25/14  0250  AST  22   --   ALT  30   --   ALKPHOS  75   --   BILITOT  0.7   --   ALBUMIN  2.4*  2.7*   Glucose Recent Labs     06/24/14  0042  06/24/14  0805  06/24/14  1615  06/24/14  2330  06/25/14  2348  06/26/14  0845  GLUCAP  80  91  85  82  97  92    Imaging No results found.    ASSESSMENT / PLAN:  PULMONARY ETT 01/07 >>>1/16, 1/17>> 1/26 A: Hx  of OSA, COPD Recurrent acute hypoxic respiratory failure Pulm edema, resolved Recurrent bronchospasm   P:    Cont supp O2 Cont nebulized BDs Cont nebulized steroids  CARDIOVASCULAR R [place] 01/07 >> 1/09 Rt [place] CVL 1/22 >>  Lt IJ HD cath 1/14 >> out A:  S/P AAA repair  Hypovolemic/hemorrhagic shock, resolved New onset PAF/flutter Hx of CAD s/p CABG, HTN, HLD, PAD P:  Cont current Rx Cont full dose LMWH - transition to Coumadin when okay with vascular  RENAL A:   AKI, nonoliguric - improving CKD III, baseline Cr 1.8 to 2.1 Hypokalemia, resolved Hypernatremia P:   Monitor BMET intermittently Monitor I/Os Correct electrolytes as indicated  GASTROINTESTINAL A:   Hx of GERD, chronic PPI use Suspected post extubation dysphagia P:   SUP: PO PPI DII diet per SLP  HEMATOLOGIC A:  Acute blood loss anemia, no further blood loss Thrombocytopenia, resolved P: DVT px: full LMWH for PAF Monitor CBC intermittently  INFECTIOUS A:   Surgical wound infection, L  groin P:   All micro and abx reviewed Can discontinue ceftriaxone   ENDOCRINE A:   H/O "borderline" DM P:   Monitor glu on chem panels   NEUROLOGIC A:   Acute encephalopathy, improving P:   RASS goal: 0 Minimize sedative/analgesics post extubation    PCCM to sign off, he can follow up with [place] as outpatient  Rigoberto Noel. MD   06/26/2014, 12:39 PM

## 2014-06-26 NOTE — Progress Notes (Addendum)
    [facility]  Subjective  - No apatite still and he states he likes that he is loosing weight.   Objective 130/69 74 97.7 F (36.5 C) (Oral) 19 94%  Intake/Output Summary (Last 24 hours) at 06/26/14 0755 Last data filed at 06/26/14 0530  Gross per 24 hour  Intake    770 ml  Output    850 ml  Net    -80 ml   Feet warm well perfused Groin dressing in place Patient mobility improving ambulating daily up to 200 feet O2 SAT 94% RA Abdomin soft  Assessment/Planning: POD #26 Aorta By Fem bypass with left renal bypass Central line D/C'd Lungs tolerating RA SAT 93% Cr 2.09 decreasing and stable UO stable Antibiotics cont or not?  WBC 8.5 Started 06/14/2014 discussed with Dr. [name] and will D/C rocephin. Diet pending further work up per ST dysphasia 3 currently poor intake Will call cardiology about home anticoagulation needs   Laurence Slate [facility] 06/26/2014 7:55 AM --  Laboratory Lab Results:  Recent Labs  06/24/14 0430  WBC 8.5  HGB 10.3*  HCT 32.4*  PLT 320   BMET  Recent Labs  06/24/14 0430 06/25/14 0250  NA 137 134*  K 4.0 4.0  CL 99 96  CO2 31 29  GLUCOSE 104* 93  BUN 57* 49*  CREATININE 2.24* 2.09*  CALCIUM 9.1 9.1    COAG Lab Results  Component Value Date   INR 1.21 06/18/2014   INR 1.34 06/09/2014   INR 1.21 06/04/2014   No results found for: PTT    I agree with the above.  We are working on discharge planning.  Annamarie Major

## 2014-06-26 NOTE — Progress Notes (Signed)
Pt refuses CPAP at this time. He was told to call RT if he wants it later. CPAP setup in the room ready for use.

## 2014-06-26 NOTE — Progress Notes (Signed)
S:  Had a restful night O:BP 130/69 mmHg  Pulse 74  Temp(Src) 97.7 F (36.5 C) (Oral)  Resp 19  Ht 5\' 7"  (1.702 m)  Wt 88.9 kg (195 lb 15.8 oz)  BMI 30.69 kg/m2  SpO2 94%  Intake/Output Summary (Last 24 hours) at 06/26/14 0741 Last data filed at 06/26/14 0530  Gross per 24 hour  Intake    770 ml  Output    850 ml  Net    -80 ml   Weight change:  Gen: Awake and alert CVS: Irreg, irreg Resp: Decreased BS bases with few basilar crackles Abd: + BS NDNT Ext: tr edema NEURO:CNI OX3 no asterixis   . amLODipine  10 mg Oral Daily  . antiseptic oral rinse  7 mL Mouth Rinse q12n4p  . budesonide  0.5 mg Nebulization BID  . carvedilol  12.5 mg Oral BID WC  . cefTRIAXone (ROCEPHIN)  IV  1 g Intravenous Q24H  . chlorhexidine  15 mL Mouth Rinse BID  . collagenase   Topical Daily  . darbepoetin (ARANESP) injection - NON-DIALYSIS  100 mcg Subcutaneous Q Wed-1800  . enoxaparin (LOVENOX) injection  120 mg Subcutaneous Q24H  . furosemide  80 mg Oral BID  . ipratropium  0.5 mg Nebulization Q6H  . levalbuterol  0.63 mg Nebulization Q6H  . pantoprazole  40 mg Oral Q1200  . potassium chloride  20 mEq Oral Daily  . sodium chloride  10-40 mL Intracatheter Q12H   No results found. BMET    Component Value Date/Time   NA 134* 06/25/2014 0250   K 4.0 06/25/2014 0250   CL 96 06/25/2014 0250   CO2 29 06/25/2014 0250   GLUCOSE 93 06/25/2014 0250   BUN 49* 06/25/2014 0250   CREATININE 2.09* 06/25/2014 0250   CREATININE 2.02* 05/03/2014 0829   CALCIUM 9.1 06/25/2014 0250   GFRNONAA 31* 06/25/2014 0250   GFRAA 36* 06/25/2014 0250   CBC    Component Value Date/Time   WBC 8.5 06/24/2014 0430   RBC 3.48* 06/24/2014 0430   HGB 10.3* 06/24/2014 0430   HCT 32.4* 06/24/2014 0430   PLT 320 06/24/2014 0430   MCV 93.1 06/24/2014 0430   MCH 29.6 06/24/2014 0430   MCHC 31.8 06/24/2014 0430   RDW 15.0 06/24/2014 0430   LYMPHSABS 1.0 06/17/2014 0343   MONOABS 0.6 06/17/2014 0343   EOSABS 0.2  06/17/2014 0343   BASOSABS 0.1 06/17/2014 0343     Assessment:  1. Acute on CKD 3/4 sec ATN following aortobifem and lt renal art bypass, Scr at baseline.   2. COPD 3. CAD, stable 4. A fib 5. Anemia on aranesp 6. HTN, BP improved  Plan: 1. Will sign off as I am adding little at this time and renal fx at baseline.  [facility]   Barry Lawrence

## 2014-06-26 NOTE — Progress Notes (Signed)
Speech Language Pathology Treatment: Dysphagia  Patient Details Name: Barry Lawrence MRN: [id number] DOB: 1946/02/25 Today's Date: 06/26/2014 Time: [id number] SLP Time Calculation (min) (ACUTE ONLY): 8 min  Assessment / Plan / Recommendation Clinical Impression  Pt presents with improving mentation, improved swallow function with fewer incidents of decreased toleration.  Appetite still limited per pt.  Min verbal cues in discussion of basic precautions.  Pt with improved recall of instructions to maintain safety with POs.  Recommend advancing diet to regular, thin liquids; continue meds whole in puree.    HPI HPI: 69 yo male smoker for repair of AAA. Remained on vent post-op. PMHx of CAD s/p CABG, CKD III, OSA on CPAP, DM, HTN, mild COPD.  Intubated from 1/7 to 1/15.  Swallow eval 1/16 with recs for NPO and concerns for laryngeal deficit; reintubated 1/17-1/26.     Pertinent Vitals Pain Assessment: No/denies pain  SLP Plan  Continue with current plan of care    Recommendations Diet recommendations: Regular;Thin liquid Liquids provided via: Straw Medication Administration: Whole meds with puree Supervision: Patient able to self feed;Intermittent supervision to cue for compensatory strategies Compensations: Slow rate;Small sips/bites Postural Changes and/or Swallow Maneuvers: Seated upright 90 degrees              Plan: Continue with current plan of care   Barry Lawrence L. Tivis Ringer, [place] CCC/SLP Pager [phone]      Barry Lawrence Laurice 06/26/2014, 10:27 AM

## 2014-06-26 NOTE — Progress Notes (Signed)
Pt does not wish to go on CPAP tonight. He stated he will wear it once he gets back home. No distress noted. Pt encouraged to call if pt changes mind. RN aware.

## 2014-06-26 NOTE — Progress Notes (Signed)
Medicare Important Message given? YES  (If response is "NO", the following Medicare IM given date fields will be blank)  Date Medicare IM given: 06/26/14 Medicare IM given by:  Dahlia Client [employer]

## 2014-06-26 NOTE — Progress Notes (Signed)
ANTICOAGULATION CONSULT NOTE - Follow Up Consult  Pharmacy Consult for LMWH / Coumadin Indication: atrial fibrillation  Allergies  Allergen Reactions  . Niaspan [Niacin Er] Hives   Assessment: 69 yo M admitted 05/30/2014 S/p peripheral bypass surgery.  Coag/Heme: Post-op afib - CHADSvasc 4, CBC low, stable  Stopped coumadin 1/17 due to wound healing issues and changed to full dose lovenox, 1/21: 4h anti-xa level is 0.4 - surprisingly low> increased to 120mg  (1.3mg /kg) daily (Goal 0.5-1.2) recheck 1/24 was 0.73. Held lovenox 1/26 for trach, resumed 1/27. LMWH level 1/29 0.6 (4.5 hrs post-dose) remains within goal range)  2/3--> resuming Coumadin  Goal of Therapy:  Anti-Xa level 0.6-1 units/ml 4hrs after LMWH dose given Monitor platelets by anticoagulation protocol: Yes INR = 2 to 3   Plan:  Continue Lovenox 120 mg sq Q 24 hours  Anti-Xa level later this week Coumadin 7.5 mg po x 1 dose tonight Daily INR Coumadin education     Labs:  Recent Labs  06/24/14 0430 06/25/14 0250  HGB 10.3*  --   HCT 32.4*  --   PLT 320  --   CREATININE 2.24* 2.09*    Estimated Creatinine Clearance: 36 mL/min (by C-G formula based on Cr of 2.09).   Thank you. Anette Guarneri, PharmD   06/26/2014,1:11 PM

## 2014-06-27 LAB — GLUCOSE, CAPILLARY
Glucose-Capillary: 111 mg/dL — ABNORMAL HIGH (ref 70–99)
Glucose-Capillary: 88 mg/dL (ref 70–99)
Glucose-Capillary: 92 mg/dL (ref 70–99)

## 2014-06-27 LAB — PROTIME-INR
INR: 1.25 (ref 0.00–1.49)
Prothrombin Time: 15.8 seconds — ABNORMAL HIGH (ref 11.6–15.2)

## 2014-06-27 MED ORDER — WARFARIN SODIUM 7.5 MG PO TABS
7.5000 mg | ORAL_TABLET | Freq: Once | ORAL | Status: AC
  Administered 2014-06-27: 7.5 mg via ORAL
  Filled 2014-06-27: qty 1

## 2014-06-27 NOTE — Progress Notes (Signed)
Patient Profile: 69 yo male with PMH CAD s/p CABG, renal insufficiency and now s/p aortobifemoral bypass and left renal artery bypass. Developed post-operative Afib.  Has continued in Afib since 06/06/14.   Subjective: Doing fairly well. Notes fatigue and mild occasional dyspnea.   Objective: Vital signs in last 24 hours: Temp:  [97.6 F (36.4 C)-98.9 F (37.2 C)] 97.6 F (36.4 C) (02/04 0733) Pulse Rate:  [64-84] 64 (02/04 0733) Resp:  [16-25] 20 (02/04 0733) BP: (113-148)/(54-84) 113/59 mmHg (02/04 0733) SpO2:  [94 %-100 %] 97 % (02/04 0914) Last BM Date: 06/26/14  Intake/Output from previous day: 02/03 0701 - 02/04 0700 In: 120 [P.O.:120] Out: 1250 [Urine:1250] Intake/Output this shift:    Medications Current Facility-Administered Medications  Medication Dose Route Frequency Provider Last Rate Last Dose  . 0.9 %  sodium chloride infusion   Intravenous Continuous Chesley Mires, MD   Stopped at 06/20/14 1044  . acetaminophen (TYLENOL) solution 650 mg  650 mg Per Tube Q6H PRN Chesley Mires, MD   650 mg at 06/26/14 2111  . amLODipine (NORVASC) tablet 10 mg  10 mg Oral Daily Windy Kalata, MD   10 mg at 06/27/14 0955  . antiseptic oral rinse (CPC / CETYLPYRIDINIUM CHLORIDE 0.05%) solution 7 mL  7 mL Mouth Rinse BID Serafina Mitchell, MD   7 mL at 06/27/14 1000  . carvedilol (COREG) tablet 12.5 mg  12.5 mg Oral BID WC Placido Sou, MD   12.5 mg at 06/27/14 0955  . cloNIDine (CATAPRES) tablet 0.1 mg  0.1 mg Oral Q4H PRN Windy Kalata, MD      . collagenase [facility]) ointment   Topical Daily Ulyses Amor, PA-C      . Darbepoetin Alfa (ARANESP) injection 100 mcg  100 mcg Subcutaneous Q Wed-1800 Placido Sou, MD   100 mcg at 06/19/14 1809  . enoxaparin (LOVENOX) injection 120 mg  120 mg Subcutaneous Q24H Ulyses Amor, PA-C   120 mg at 06/26/14 1321  . furosemide (LASIX) tablet 80 mg  80 mg Oral BID Placido Sou, MD   80 mg at 06/27/14 0955  . Gerhardt's butt  cream   Topical PRN Wilhelmina Mcardle, MD   1 application at [id number]  . hydrALAZINE (APRESOLINE) injection 10 mg  10 mg Intravenous Q1H PRN Elam Dutch, MD   10 mg at 06/24/14 2306  . ipratropium (ATROVENT) nebulizer solution 0.5 mg  0.5 mg Nebulization Q6H Wilhelmina Mcardle, MD   0.5 mg at 06/27/14 0913  . levalbuterol (XOPENEX) nebulizer solution 0.63 mg  0.63 mg Nebulization Q3H PRN Wilhelmina Mcardle, MD   0.63 mg at 06/23/14 1731  . levalbuterol (XOPENEX) nebulizer solution 0.63 mg  0.63 mg Nebulization Q6H Wilhelmina Mcardle, MD   0.63 mg at 06/27/14 0913  . ondansetron (ZOFRAN) injection 4 mg  4 mg Intravenous Q4H PRN Wilhelmina Mcardle, MD   4 mg at 06/25/14 1903  . pantoprazole (PROTONIX) EC tablet 40 mg  40 mg Oral Q1200 Wilhelmina Mcardle, MD   40 mg at 06/26/14 1320  . potassium chloride SA (K-DUR,KLOR-CON) CR tablet 20 mEq  20 mEq Oral Daily Windy Kalata, MD   20 mEq at 06/27/14 0955  . sodium chloride 0.9 % injection 10-40 mL  10-40 mL Intracatheter Q12H Serafina Mitchell, MD   10 mL at 06/27/14 0955  . sodium chloride 0.9 % injection 10-40 mL  10-40 mL Intracatheter PRN Butch Penny  Brabham, MD   20 mL at 06/21/14 1630  . traMADol (ULTRAM) tablet 50-100 mg  50-100 mg Oral Q6H PRN Elam Dutch, MD   100 mg at 06/27/14 0021  . Warfarin - Pharmacist Dosing Inpatient   Does not apply [id number] Serafina Mitchell, MD        PE: General appearance: alert, cooperative and no distress Neck: no carotid bruit and no JVD Lungs: rales at RLL that improve after cough Heart: irregularly irregular rhythm Extremities: trace - 1+ bilateral LEE Pulses: 2+ and symmetric Skin: warm and dry Neurologic: Grossly normal  Lab Results:  No results for input(s): WBC, HGB, HCT, PLT in the last 72 hours. BMET  BMP Latest Ref Rng 06/25/2014 06/24/2014 06/23/2014  Glucose 70 - 99 mg/dL 93 104(H) 94  BUN 6 - 23 mg/dL 49(H) 57(H) 62(H)  Creatinine 0.50 - 1.35 mg/dL 2.09(H) 2.24(H) 2.39(H)  Sodium 135 - 145 mmol/L  134(L) 137 136  Potassium 3.5 - 5.1 mmol/L 4.0 4.0 4.1  Chloride 96 - 112 mmol/L 96 99 98  CO2 19 - 32 mmol/L 29 31 29   Calcium 8.4 - 10.5 mg/dL 9.1 9.1 9.1    PT/INR  Recent Labs  06/27/14 0247  LABPROT 15.8*  INR 1.25   Filed Weights   06/19/14 0400 06/20/14 0500 06/21/14 0500  Weight: 196 lb 13.9 oz (89.3 kg) 197 lb 5 oz (89.5 kg) 195 lb 15.8 oz (88.9 kg)     Assessment/Plan    Active Problems:   AAA (abdominal aortic aneurysm)   Acute respiratory failure with hypoxia   Post-operative state   Respiratory failure   Acute renal failure   Acute respiratory failure   Atrial fibrillation   Encounter for central line placement   HCAP (healthcare-associated pneumonia)   Acute on chronic renal failure   Hypokalemia   Wound infection  1. Persistent Atrial Fibrillation: ventricular rate is controlled. Only complaint is fatigue. Continue rate control with BB therapy. Warfarin initiated yesterday. Pharmacy is assisting with dosing and monitoring. Goal INR goal is 2-3.  INR today is 1.25.  2. CAD: denies CP.  Continue medical therapy.   3. Chronic Diastolic CHF: with slight LEE. Net I/Os +1.2L. Weight is down from yesterday at 195.  Continue 80 mg Lasix BID. Continue daily weights and I/Os to monitor volume status.     LOS: 28 days    Brittainy M. Ladoris Gene 06/27/2014 11:22 AM   Patient seen and examined. Agree with assessment and plan. I/O +1,270 on diuretic therapy. AF rate controlled in the 70's on carvedilol. Renal insufficiency with Cr improved to 2.09 today. Getting coumadinized.   Troy Sine, MD, [facility] 06/27/2014 1:07 PM

## 2014-06-27 NOTE — Progress Notes (Signed)
NUTRITION FOLLOW UP  Intervention:   Provide Magic cup TID with meals, each supplement provides 290 kcal and 9 grams of protein.  Encourage adequate PO intake  Nutrition Dx:   Inadequate oral intake related to inability to eat as evidenced by NPO status, just advanced to dysphagia 2 diet;ongoing.  Goal:   Pt to meet >/= 90% of their estimated nutrition needs; not met  Monitor:   PO intake, weight trend, labs, I/O's  Assessment:   69 yo male active smoker with extensive PMH including hx CAD s/p CABG, CKD III, OSA on CPAP, DM, HTN, COPD, stable L apex 58m nodule (followed by [facility]) presented 1/7 for elective repair of 5.6cm AAA. Remains intubated post op.   Extubated 1/26.  Pt currently on DYS 3 diet with thin liquids.  Pt and wife report he consumed 100% of breakfast and lunch this morning. Pt reports he is not hungry but knows he needs to eat.  Pt has refused supplements but accepted Magic Cup TID between meals. Pt reports he has only received 2 magic cups but he likes them.  Will change Magic Cup to with meals.    Height: Ht Readings from Last 1 Encounters:  05/30/14 5' 7"  (1.702 m)    Weight Status:   Wt Readings from Last 1 Encounters:  06/21/14 195 lb 15.8 oz (88.9 kg)   05/30/14 197 lb 9 oz (89.614 kg)   Body mass index is 30.69 kg/(m^2). Class I obesity  Re-estimated needs:  Kcal: 2000-2200 Protein: 110-125 gm Fluid: >/= 2 L  Skin: closed abdominal incision, closed right and left groin incisions, + 1 generalized edema  Diet Order: DIET DYS 3   Intake/Output Summary (Last 24 hours) at 06/27/14 1243 Last data filed at 06/27/14 0500  Gross per 24 hour  Intake    120 ml  Output   1250 ml  Net  -1130 ml    Last BM: 1/28   Labs:   Recent Labs Lab 06/23/14 0425 06/24/14 0430 06/25/14 0250  NA 136 137 134*  K 4.1 4.0 4.0  CL 98 99 96  CO2 29 31 29   BUN 62* 57* 49*  CREATININE 2.39* 2.24* 2.09*  CALCIUM 9.1 9.1 9.1  PHOS 4.7* 4.4 3.6  GLUCOSE 94  104* 93    CBG (last 3)   Recent Labs  06/26/14 1540 06/27/14 0011 06/27/14 0730  GLUCAP 130* 88 92    Scheduled Meds: . amLODipine  10 mg Oral Daily  . antiseptic oral rinse  7 mL Mouth Rinse BID  . carvedilol  12.5 mg Oral BID WC  . collagenase   Topical Daily  . darbepoetin (ARANESP) injection - NON-DIALYSIS  100 mcg Subcutaneous Q Wed-1800  . enoxaparin (LOVENOX) injection  120 mg Subcutaneous Q24H  . furosemide  80 mg Oral BID  . ipratropium  0.5 mg Nebulization Q6H  . levalbuterol  0.63 mg Nebulization Q6H  . pantoprazole  40 mg Oral Q1200  . potassium chloride  20 mEq Oral Daily  . sodium chloride  10-40 mL Intracatheter Q12H  . warfarin  7.5 mg Oral ONCE-1800  . Warfarin - Pharmacist Dosing Inpatient   Does not apply q1800    Continuous Infusions: . sodium chloride Stopped (06/20/14 1044)    LElmer PickerMS Dietetic Intern Pager Number [phone]

## 2014-06-27 NOTE — Progress Notes (Signed)
ANTICOAGULATION CONSULT NOTE - Follow Up Consult  Pharmacy Consult for LMWH / Coumadin Indication: atrial fibrillation  Allergies  Allergen Reactions  . Niaspan [Niacin Er] Hives   Assessment: 69 yo M admitted 05/30/2014 S/p peripheral bypass surgery.  Coag/Heme: Post-op afib - CHADSvasc 4, CBC low, stable  Stopped coumadin 1/17 due to wound healing issues and changed to full dose lovenox, 1/21: 4h anti-xa level is 0.4 - surprisingly low> increased to 120mg  (1.3mg /kg) daily (Goal 0.5-1.2) recheck 1/24 was 0.73. Held lovenox 1/26 for trach, resumed 1/27. LMWH level 1/29 0.6 (4.5 hrs post-dose) remains within goal range)  2/3--> resuming Coumadin, INR = 1.25 today  Goal of Therapy:  Anti-Xa level 0.6-1 units/ml 4hrs after LMWH dose given Monitor platelets by anticoagulation protocol: Yes INR = 2 to 3   Plan:  Continue Lovenox 120 mg sq Q 24 hours  Anti-Xa level later this week Repeat Coumadin 7.5 mg po x 1 dose tonight Daily INR Coumadin education     Labs:  Recent Labs  06/25/14 0250 06/27/14 0247  LABPROT  --  15.8*  INR  --  1.25  CREATININE 2.09*  --     Estimated Creatinine Clearance: 36 mL/min (by C-G formula based on Cr of 2.09).   Thank you. Anette Guarneri, PharmD   06/27/2014,11:50 AM

## 2014-06-27 NOTE — Progress Notes (Signed)
Chart reviewed and note that patient is still min guard assist due to hypoxia and fatigue. Would recommend SNF for therapy past discharge.

## 2014-06-28 ENCOUNTER — Telehealth: Payer: Self-pay | Admitting: Surgery

## 2014-06-28 DIAGNOSIS — Z7901 Long term (current) use of anticoagulants: Secondary | ICD-10-CM | POA: Insufficient documentation

## 2014-06-28 LAB — BASIC METABOLIC PANEL
Anion gap: 11 (ref 5–15)
BUN: 32 mg/dL — ABNORMAL HIGH (ref 6–23)
CO2: 28 mmol/L (ref 19–32)
Calcium: 8.8 mg/dL (ref 8.4–10.5)
Chloride: 93 mmol/L — ABNORMAL LOW (ref 96–112)
Creatinine, Ser: 2.04 mg/dL — ABNORMAL HIGH (ref 0.50–1.35)
GFR calc Af Amer: 37 mL/min — ABNORMAL LOW (ref >90)
GFR calc non Af Amer: 32 mL/min — ABNORMAL LOW (ref >90)
Glucose, Bld: 96 mg/dL (ref 70–99)
Potassium: 3.7 mmol/L (ref 3.5–5.1)
Sodium: 132 mmol/L — ABNORMAL LOW (ref 135–145)

## 2014-06-28 LAB — PROTIME-INR
INR: 1.35 (ref 0.00–1.49)
Prothrombin Time: 16.8 seconds — ABNORMAL HIGH (ref 11.6–15.2)

## 2014-06-28 LAB — GLUCOSE, CAPILLARY: Glucose-Capillary: 98 mg/dL (ref 70–99)

## 2014-06-28 MED ORDER — WARFARIN SODIUM 5 MG PO TABS: 5.0000 mg | ORAL_TABLET | Freq: Every day | ORAL | Status: DC

## 2014-06-28 MED ORDER — WARFARIN SODIUM 7.5 MG PO TABS
7.5000 mg | ORAL_TABLET | Freq: Once | ORAL | Status: DC
  Filled 2014-06-28: qty 1

## 2014-06-28 MED ORDER — CARVEDILOL 12.5 MG PO TABS: 12.5000 mg | ORAL_TABLET | Freq: Two times a day (BID) | ORAL | Status: DC

## 2014-06-28 MED ORDER — TRAMADOL HCL 50 MG PO TABS: 50.0000 mg | ORAL_TABLET | Freq: Four times a day (QID) | ORAL | Status: DC | PRN

## 2014-06-28 MED ORDER — WARFARIN SODIUM 7.5 MG PO TABS: 7.5000 mg | ORAL_TABLET | Freq: Once | ORAL | Status: DC

## 2014-06-28 MED ORDER — ENOXAPARIN SODIUM 60 MG/0.6ML ~~LOC~~ SOLN: 120.0000 mg | Freq: Two times a day (BID) | SUBCUTANEOUS | Status: DC

## 2014-06-28 NOTE — Progress Notes (Signed)
SATURATION QUALIFICATIONS: (This note is used to comply with regulatory documentation for home oxygen)  Patient Saturations on Room Air at Rest = 95%  Patient Saturations on Room Air while Ambulating = 93%  Patient Saturations on 0 Liters of oxygen while Ambulating = 93%  Please briefly explain why patient needs home oxygen: 

## 2014-06-28 NOTE — Clinical Social Work Note (Signed)
Patient is refusing to go to SNF and would prefer to go home with home health services or be set up with outpatient rehab.    Patient reports having been to rehab in the past and did feel as if helped him recover.  Patients wife was at bedside and agrees with plan to return home and she reports that she is capable of helping him and is only concerned about the patients ability to climb stairs.  CSW informed RNCM of home health preference.  CSW signing off.  Domenica Reamer, [place] Social Worker [phone]

## 2014-06-28 NOTE — Progress Notes (Signed)
Speech Language Pathology Treatment: Dysphagia  Patient Details Name: ARTHURO CANELO MRN: [id number] DOB: 07/29/1945 Today's Date: 06/28/2014 Time: 6943-7005 SLP Time Calculation (min) (ACUTE ONLY): 13 min  Assessment / Plan / Recommendation Clinical Impression  Pt with significantly improved swallow function - improved phonation quality and volume, no s/s of aspiration per observation. Diet advanced to regular. Independent with precautions.  No further SLP warranted.  Education complete.    HPI HPI: 69 yo male smoker for repair of AAA. Remained on vent post-op. PMHx of CAD s/p CABG, CKD III, OSA on CPAP, DM, HTN, mild COPD.  Intubated from 1/7 to 1/15.  Swallow eval 1/16 with recs for NPO and concerns for laryngeal deficit; reintubated 1/17-1/26.     Pertinent Vitals Pain Assessment: 0-10 Pain Score: 8  Pain Location: back Pain Intervention(s): RN gave pain meds during session  SLP Plan  All goals met    Recommendations Diet recommendations: Regular;Thin liquid Liquids provided via: Straw Medication Administration: Whole meds with puree Supervision: Patient able to self feed Compensations: Slow rate;Small sips/bites Postural Changes and/or Swallow Maneuvers: Seated upright 90 degrees              Oral Care Recommendations: Oral care BID Follow up Recommendations: None Plan: All goals met   Kailoni Vahle L. Tivis Ringer, [place] CCC/SLP Pager [phone]      Juan Quam Laurice 06/28/2014, 9:49 AM

## 2014-06-28 NOTE — Progress Notes (Signed)
Wife at bedside. Patient refusing to go to significant, wants to go home. He is resting comfortably in bed without oxygen. Groins are healing adequately.  I discussed with the patient going home with multiple services assisting from a home health perspective.  This appears to be our best solution.  I think from a medical perspective he is ready to be discharged.  He still has some needs regarding possible oxygen requirement with activity, improving his oral intake as well as his overall physical conditioning.  These concerns appeared to be addressed with physical therapy and home health.  Therefore follow arrangements can be made he'll be discharged to home later today with follow-up with me in 2 weeks  Wells Alletta Mattos

## 2014-06-28 NOTE — Progress Notes (Signed)
Patient discharged to home with his wife. Discharge instructions and prescriptions given, both the patient and his wife verbalized understanding of instructions given. Patient taken out by wheelchair transport. [place], RN

## 2014-06-28 NOTE — Progress Notes (Signed)
ANTICOAGULATION CONSULT NOTE - Follow Up Consult  Pharmacy Consult for LMWH / Coumadin Indication: atrial fibrillation  Allergies  Allergen Reactions  . Niaspan [Niacin Er] Hives   Assessment: 69 yo M admitted 05/30/2014 S/p peripheral bypass surgery.  Coag/Heme: Post-op afib - CHADSvasc 4, CBC low, stable  Stopped coumadin 1/17 due to wound healing issues and changed to full dose lovenox, 1/21: 4h anti-xa level is 0.4 - surprisingly low> increased to 120mg  (1.3mg /kg) daily (Goal 0.5-1.2) recheck 1/24 was 0.73. Held lovenox 1/26 for trach, resumed 1/27. LMWH level 1/29 0.6 (4.5 hrs post-dose) remains within goal range). Most recent hgb 10.3, plt wnl on 2/1. Renal function continue improving, est. crcl ~ 30-7ml/min.  2/3--> resuming Coumadin, INR = 1.35 today  Goal of Therapy:  Anti-Xa level 0.6-1 units/ml 4hrs after LMWH dose given Monitor platelets by anticoagulation protocol: Yes INR = 2 to 3   Plan:  Repeat Coumadin 7.5 mg po x 1 dose tonight Daily INR Continue Lovenox 120 mg sq Q 24 hours,  CBC, BMET in AM, will consider changing to 90 mg BID if renal function continue improving. Coumadin education  Maryanna Shape, PharmD, BCPS  Clinical Pharmacist  Pager: [phone]  06/28/2014,10:24 AM

## 2014-06-28 NOTE — Progress Notes (Signed)
Patient Name: Barry Lawrence Date of Encounter: 06/28/2014  Active Problems:   AAA (abdominal aortic aneurysm)   Acute respiratory failure with hypoxia   Post-operative state   Respiratory failure   Acute renal failure   Acute respiratory failure   Atrial fibrillation   Encounter for central line placement   HCAP (healthcare-associated pneumonia)   Acute on chronic renal failure   Hypokalemia   Wound infection   Primary Cardiologist: Dr. Gwenlyn Found  Patient Profile: 69 yo male active smoker with hx CABG, CKD III, OSA on CPAP, DM, HTN, COPD, stable L apex 42mm nodule, presented 1/7 for elective repair of 5.6cm AAA, s/p aortobifemoral bypass and left renal artery bypass. Developed post-operative Afib. Has continued in Afib since 06/06/14.   SUBJECTIVE: Still feels tired, can't tell he converted, hopes for d/c soon.  OBJECTIVE Filed Vitals:   06/27/14 1453 06/27/14 1927 06/27/14 1951 06/28/14 0523  BP:   157/83 154/79  Pulse:   74 82  Temp:   97.4 F (36.3 C) 97.7 F (36.5 C)  TempSrc:   Oral Oral  Resp:   20 19  Height:      Weight:      SpO2: 93% 95% 98% 94%    Intake/Output Summary (Last 24 hours) at 06/28/14 0818 Last data filed at 06/28/14 0530  Gross per 24 hour  Intake    240 ml  Output      0 ml  Net    240 ml   Filed Weights   06/19/14 0400 06/20/14 0500 06/21/14 0500  Weight: 196 lb 13.9 oz (89.3 kg) 197 lb 5 oz (89.5 kg) 195 lb 15.8 oz (88.9 kg)    PHYSICAL EXAM General: Well developed, well nourished, male in no acute distress. Head: Normocephalic, atraumatic.  Neck: Supple without bruits, JVD not elevated. Lungs:  Resp regular and unlabored, rales bases. Heart: RRR, S1, S2, no S3, S4, 2/6 murmur; no rub. Abdomen: Soft, non-tender, non-distended, BS + x 4.  Extremities: No clubbing, cyanosis, no edema.  Neuro: Alert and oriented X 3. Moves all extremities spontaneously. Psych: Normal affect.  LABS: INR: Recent Labs  06/28/14 0537  INR 1.35     Basic Metabolic Panel: BMP Latest Ref Rng 06/28/2014 06/25/2014 06/24/2014  Glucose 70 - 99 mg/dL 96 93 104(H)  BUN 6 - 23 mg/dL 32(H) 49(H) 57(H)  Creatinine 0.50 - 1.35 mg/dL 2.04(H) 2.09(H) 2.24(H)  Sodium 135 - 145 mmol/L 132(L) 134(L) 137  Potassium 3.5 - 5.1 mmol/L 3.7 4.0 4.0  Chloride 96 - 112 mmol/L 93(L) 96 99  CO2 19 - 32 mmol/L 28 29 31   Calcium 8.4 - 10.5 mg/dL 8.8 9.1 9.1     TELE:   Converted to SR at 8:18 am  Current Medications:  . amLODipine  10 mg Oral Daily  . antiseptic oral rinse  7 mL Mouth Rinse BID  . carvedilol  12.5 mg Oral BID WC  . collagenase   Topical Daily  . darbepoetin (ARANESP) injection - NON-DIALYSIS  100 mcg Subcutaneous Q Wed-1800  . enoxaparin (LOVENOX) injection  120 mg Subcutaneous Q24H  . furosemide  80 mg Oral BID  . ipratropium  0.5 mg Nebulization Q6H  . levalbuterol  0.63 mg Nebulization Q6H  . pantoprazole  40 mg Oral Q1200  . potassium chloride  20 mEq Oral Daily  . sodium chloride  10-40 mL Intracatheter Q12H  . Warfarin - Pharmacist Dosing Inpatient   Does not apply q1800   .  sodium chloride Stopped (06/20/14 1044)    ASSESSMENT AND PLAN: 1. Persistent Atrial Fibrillation:  -- ventricular rate was controlled, now in SR.  --  Thought fatigue was from afib, but has not yet improved, follow -- Continue BB therapy.  -- Warfarin initiated 02/02. Pharmacy is dosing and monitoring. Goal INR goal is 2-3.  -- INR today is 1.35.  2. CAD: denies CP. Continue medical therapy.   3. Chronic Diastolic CHF:  -- no LE edema today.  -- Net I/Os +1.5L but are incomplete!   -- Continue 80 mg Lasix BID.  -- Restart daily weights to monitor volume status, none since 01/29.   Otherwise, per [facility], possible d/c to SNF, timing unclear Active Problems:   AAA (abdominal aortic aneurysm)   Acute respiratory failure with hypoxia   Post-operative state   Respiratory failure   Acute renal failure   Acute respiratory failure   Atrial  fibrillation   Encounter for central line placement   HCAP (healthcare-associated pneumonia)   Acute on chronic renal failure   Hypokalemia   Wound infection   Signed, Rosaria Ferries , PA-C 8:18 AM 06/28/2014    Patient seen and examined. Agree with assessment and plan. Feels better; converted to sinus rhythm yesterday. Cr. 2.04 slightly improved from 2.09 yesterday. Getting coumadinized. ? Is pt going to SNF short term vs home.   Troy Sine, MD, [facility] 06/28/2014 9:50 AM

## 2014-06-28 NOTE — Telephone Encounter (Addendum)
-----   Message from Mena Goes, RN sent at 06/28/2014  3:05 PM EST ----- Regarding: Schedule   ----- Message -----    From: Ulyses Amor, PA-C    Sent: 06/28/2014  12:36 PM      To: [facility] Charge Pool  F/U with Dr. [name] in 2 weeks s/p open AAA left renal artery by pass   06/28/14: lm for pt re appt, dpm

## 2014-06-28 NOTE — Progress Notes (Signed)
Physical Therapy Treatment Patient Details Name: Barry Lawrence MRN: [id number] DOB: 04-Aug-1945 Today's Date: 06/28/2014    History of Present Illness Pt adm for AAA repair on 05/30/13. Post op complications include VDRF, renal failure with pt on CVVHD. Extubated 1/26. PMHx of CAD s/p CABG, CKD III, OSA on CPAP, DM, HTN, mild COPD.    PT Comments    Patient seen for assessment of mobility and stair negotiation. Patient tolerated activity well, was able to perform stair negotiation with increased time and extended rest breaks.  Spoke with patient at length regarding energy conservation techniques and safety with mobility. Discussed pursed lip breathing and activity limitations and self awareness. Anticipate patient will be safe for dc home with wife assist.    Follow Up Recommendations  Home health PT;Supervision/Assistance - 24 hour     Equipment Recommendations  None recommended by PT    Recommendations for Other Services Rehab consult;OT consult     Precautions / Restrictions Precautions Precautions: Fall Precaution Comments: monitor 02, HR Restrictions Weight Bearing Restrictions: No    Mobility  Bed Mobility Overal bed mobility: Needs Assistance Bed Mobility: Supine to Sit     Supine to sit: Min assist     General bed mobility comments: min assist to pull to EOB  Transfers Overall transfer level: Needs assistance Equipment used: Rolling walker (2 wheeled) Transfers: Sit to/from Stand Sit to Stand: Supervision;Min guard         General transfer comment: Min guard for inital instability  Ambulation/Gait Ambulation/Gait assistance: Supervision;Min guard Ambulation Distance (Feet): 210 Feet (33ft without device) Assistive device: Rolling walker (2 wheeled) Gait Pattern/deviations: Step-through pattern;Decreased stride length;Trunk flexed Gait velocity: decreased Gait velocity interpretation: Below normal speed for age/gender General Gait Details: VCs for  increased cadence, 3 standing rest breaks, SpO2>90%   Stairs Stairs: Yes Stairs assistance: Min guard Stair Management: Two rails;Step to pattern Number of Stairs: 6 General stair comments: Rest break upon completion, spo2 90% post stair negotiation, 2 minute rest, increased fatigue  Wheelchair Mobility    Modified Rankin (Stroke Patients Only)       Balance     Sitting balance-Leahy Scale: Good       Standing balance-Leahy Scale: Fair Standing balance comment: performed some static standing while sizing RW, no evidence of LOB                    Cognition Arousal/Alertness: Awake/alert Behavior During Therapy: WFL for tasks assessed/performed Overall Cognitive Status: Within Functional Limits for tasks assessed                      Exercises      General Comments        Pertinent Vitals/Pain Pain Assessment: 0-10 Pain Score: 6  Pain Location: back Pain Intervention(s): Monitored during session    Home Living                      Prior Function            PT Goals (current goals can now be found in the care plan section) Acute Rehab PT Goals PT Goal Formulation: With patient Time For Goal Achievement: 07/04/14 Potential to Achieve Goals: Good Progress towards PT goals: Progressing toward goals    Frequency  Min 3X/week    PT Plan Discharge plan needs to be updated    Co-evaluation             End of  Session Equipment Utilized During Treatment: Gait belt Activity Tolerance: Patient limited by fatigue Patient left: in bed;with call bell/phone within reach;with family/visitor present (seated EOB)     Time: [id number] PT Time Calculation (min) (ACUTE ONLY): 24 min  Charges:  $Gait Training: 8-22 mins $Self Care/Home Management: 8-22                    G CodesDuncan Dull Jul 12, 2014, 1:57 PM Alben Deeds, [place] DPT  [phone]

## 2014-07-01 ENCOUNTER — Other Ambulatory Visit: Payer: Self-pay

## 2014-07-01 ENCOUNTER — Ambulatory Visit (INDEPENDENT_AMBULATORY_CARE_PROVIDER_SITE_OTHER): Payer: BLUE CROSS/BLUE SHIELD | Admitting: Pharmacist Clinician (PhC)/ Clinical Pharmacy Specialist

## 2014-07-01 DIAGNOSIS — I480 Paroxysmal atrial fibrillation: Secondary | ICD-10-CM

## 2014-07-01 DIAGNOSIS — I4819 Other persistent atrial fibrillation: Secondary | ICD-10-CM

## 2014-07-01 DIAGNOSIS — Z7901 Long term (current) use of anticoagulants: Secondary | ICD-10-CM | POA: Insufficient documentation

## 2014-07-01 LAB — POCT INR: INR: 1.9

## 2014-07-01 MED ORDER — ENOXAPARIN SODIUM 120 MG/0.8ML ~~LOC~~ SOLN: 120.0000 mg | Freq: Two times a day (BID) | SUBCUTANEOUS | Status: DC

## 2014-07-02 NOTE — Discharge Summary (Signed)
[facility] Discharge Summary   Patient ID:  Barry Lawrence MRN: [id number] DOB/AGE: 1946-04-14 69 y.o.  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 CAD (coronary artery disease)     myoview 04/21/11-normal, EF49%  . S/P CABG (coronary artery bypass graft) 1996  . Hyperlipemia   . Polycythemia     H/O  . GERD (gastroesophageal reflux disease)   . Tobacco abuse   . CKD (chronic kidney disease), stage III   . Myocardial infarction 1996  . OSA on CPAP     CPAP q night , CPAP is through [place], states doesn't remember when he had the last study  . Pneumonia     hosp. as a child with pneumonia, not since   . Diabetes     BORDERLINE  . Headache     uses [employer], states he is lessening what he is taking for prep. for surgery   . Arthritis     in his back  . Hypertension     stress test- done 04/2014, followed by Dr. Gwenlyn Found  . Carotid stenosis 01/15/08    right endarterectomy-Dr. Amedeo Plenty  . PAD (peripheral artery disease) 06/12/09    a. 11/22/07 PTA & stenting right external iliac artery;bilateral iliac & PTI and stenting 1997;right ICA =>50% reduction,right SFA >50%,left ICA at stent => 50% reduction,left EIA => 50% reduction,left ATA occluded, left SFA mid > 49% reduction;  03/2014 Angio: LRA 90, patent L Iliac stent and distal RCIA stent, large saccular AAA.  [name] Abdominal aortic aneurysm 01/28/10; 02/05/14    4.3x4.6cm;  now measuring 5.5 cm    Procedure(s): AORTOBIFEMORAL BYPASS GRAFT LEFT RENAL ARTERY BYPASS  Discharged Condition: good  HPI:  The patient is back today for further discussions regarding his abdominal aortic  aneurysm with maximum diameter of 5.6 cm. He recently underwent angiography by Dr. Alvester Chou which revealed a high-grade left renal artery stenosis. The patient has a history of iliac stenting. Based on his imaging, I do not feel he has adequate anatomy for an endovascular procedure. The patient also underwent pulmonary evaluation by Dr. Melvyn Novas. He is cleared for surgery. He does continue to smoke. Pre-op Plan: I discussed with the patient that I feel his best option for repair is an aortobifemoral bypass graft. I do not feel his anatomy is suitable for endovascular repair. I discussed the risks and benefits of the procedure including the risk of death, cardioplegic complications, intestinal and lower extremity ischemia. The patient would like to get this done in January. He understands the risk of rupture. I would also plan on a left renal artery bypass. He does have a history of renal insufficiency. He is currently being evaluated by nephrology. Angiography revealed a high-grade left renal artery stenosis.   The patient has a nodule on CT scan which I will order a follow-up in 6 months. I discussed this with the patient today.    Hospital Course:  Barry Lawrence is a 69 y.o. male is S/P Procedure(s): AORTOBIFEMORAL BYPASS GRAFT LEFT RENAL ARTERY BYPASS Post op day # 1 he remained intubated with good urine out put of 50cc/hr.  Vasopressors neo synephrine and Dopamine on board.   POD# 2 HGB  6.9 transfused 1 unit PRBC.  Decreased UO renal consult. POD#3 AKI ischemic ATN  POD # 4 BP stable off pressors. UO 25 cc/hr Cr 5.99 up from 5.17 Possible needs dialysis short term.  Still intubated.    POD# 5 Tube feeding via OGT.  UO 50cc/hr.  Start CRRT Cr 7.14. POD# 6 Lasix given times 2 doses of 80 Output 20-70 cc/hr.  Abdomin distended positive BM yesterday.   Sedation decreased. He is alert at times. POD# 7 Extubated HD on hold.  Cardiology following for rate control off pressors, starting  heparin IV for A fib.  Start sips and ice. POD# 8 Started coumadin.  Continues off vent with support on O2 to maintain SATs.  Speech eval for swallowing and recommended diet.  Ice chips NPO. POD# 9 Respiratory insufficiency re-intubated unable to clear secretions.  * He has some superficial skin necrosis to both groin wounds and some serous drainage from the left groin. Continue meticulous wound care and change dressings to left groin as needed. I will stop his Coumadin as I think this interferes with wound healing and increasing to full dose Lovenox for his atrial fibrillation. POD# 10 HR dropped to 40's precedex started.   Cr stable NG tube nutrition. Per Dr. Nicole Cella note: he is on Coumadin because of A. Fib. Given that Coumadin can interfere with wound healing I have stopped his Coumadin and asked the pharmacy to increase his Lovenox to full dose Lovenox for the time being.  ID: I think that given the superficial skin necrosis of his groin incisions it would be helpful for him to be on antibiotics. Chest x-ray also suggest possible pneumonia. This reason I will see what critical care medicine's thoughts are on choice of antibiotics. POD# 11  Cr 4.91 rising slowly UO 100 cc/hr on free H2O and lasix q 6 A fib Lovenox full dose Positive non-bloody BM 06/10/2014 C dif neg Maceration left groin incision dry guaze placed over bilateral groin incisions Intubated. Talk of tracheostomy verse trial of extubation.  Temp 101 started vanco and zosyn. POD# 12 Start wet to dry dressing changes on groins. POD# 13  Wet to dry left groin, dry dressing right groin Intubated alert WBC increased to 13, on IV zosyn and Vancomycin DVT Lovenox 100 mg SQ q 24, SCD's On K+ replacement therapy Cr rising 5.02 today, UO 250 last hour POD# 14 UOP is excellent with lasix. POD# 15 Procedure: Insertion of Central Venous Catheter Indications: Assessment of intravascular volume and Drug and/or fluid administration POD# 16     NEURO: sedated for vent  PULM:Some progress on vent wean but looks like trach will be needed per PCCM, Appreciate PCCM's assistance  CV: AF, Hypotensive, CK negative.   HEME: likely serial phleb. Giving relative anemia, give 1 unit pRBC rather than fluid  FEN: tube feeds at 60 cc/hr  GI: rectal tube in place, possible result of TF  REN: Non-oliguric renal failure: Cr slightly improved, responsive to diuretic at this point, appreciate nephrology input  POD# 17 Some vent wean progress  Defer to PCCM if ready to try extubate vs trach  Acceptable response to 1 u pRBC  Change tube feeds  Both groins unchanged: can probably VAC L groin, R groin probably needs some additional debridement (Santyl vs sharp debridement). POD# 18  Possible trach today We will address the groin wound care once Dr. [name] has examined him. Tube feeding in place Hypokalemia replaced Acute blood loss anemia. He received 1 unit PRBC  06/15/2014, 5 previous units and 5 units of FFP intraoperative. WBC stable 7.0 Lovenox 120 mg SQ q 24 hours POD# 19 bilateral groin wounds worse sharply debrided at the bedside. Plan to star santyl dressing changes POD# 20 Patient extubated Speech re-eval for swallowing safety.  Lovenox restarted was on hold for tracheostomy .   POD# 21 BIPAP comfortable resting, O2 SAT 98 %.  NG tube discontinued POD# 22 nebulizer SAT 93% Left groin cultures positive for E coli POD# 23 off bi-pap over night POD# 24 UO 200 cc /hr. Lasix 80 mg BID POD# 25 Consult CIR ambulating with nursing, transfer to 3S  POD# 26 patient deemed to high functioning for CIR.  Plan discharge to SNF verse Home. POD# 27 Diet pending further work up per ST dysphasia 3 currently poor intake.  D/C antibiotics WBC 8.5 stable.  Lungs stable on RA SAT 93% POD# 28 discharged home with home health RN for dressing wound checks wet to dry bilateral groins.  PT for Mobility.  He will f/u in 2 weeks in the  office.    Consults:  Treatment Team:  Rounding Lbcardiology, MD  Significant Diagnostic Studies: CBC Lab Results  Component Value Date   WBC 8.5 06/24/2014   HGB 10.3* 06/24/2014   HCT 32.4* 06/24/2014   MCV 93.1 06/24/2014   PLT 320 06/24/2014    BMET    Component Value Date/Time   NA 132* 06/28/2014 0537   K 3.7 06/28/2014 0537   CL 93* 06/28/2014 0537   CO2 28 06/28/2014 0537   GLUCOSE 96 06/28/2014 0537   BUN 32* 06/28/2014 0537   CREATININE 2.04* 06/28/2014 0537   CREATININE 2.02* 05/03/2014 0829   CALCIUM 8.8 06/28/2014 0537   GFRNONAA 32* 06/28/2014 0537   GFRAA 37* 06/28/2014 0537   COAG Lab Results  Component Value Date   INR 1.9 07/01/2014   INR 1.35 06/28/2014   INR 1.25 06/27/2014     Disposition:  Discharge to :Home Discharge Instructions    Call MD for:  redness, tenderness, or signs of infection (pain, swelling, bleeding, redness, odor or green/yellow discharge around incision site)    Complete by:  As directed      Call MD for:  severe or increased pain, loss or decreased feeling  in affected limb(s)    Complete by:  As directed      Call MD for:  temperature >100.5    Complete by:  As directed      Discharge instructions    Complete by:  As directed   Twice per day dressing changes both groins wet to dry     Driving Restrictions    Complete by:  As directed   No driving for 4 weeks     Face-to-face encounter (required for Medicare/Medicaid patients)    Complete by:  As directed   I COLLINS, [place] certify that this patient is under my care and that I, or a nurse practitioner or physician's assistant working with me, had a face-to-face encounter that meets the physician face-to-face encounter requirements with this patient on 06/28/2014. The encounter with the patient was in whole, or in part for the following medical condition(s) which is the primary reason for home health care (List medical condition): Patient is deconditioned he has  been hospitalized for 30 days.  He can't ambulate without assistive devices more than 200 feet.  He has complex wounds bilateral groins need wet to dry dressing changes BID.  The encounter with the  patient was in whole, or in part, for the following medical condition, which is the primary reason for home health care:  deconditioning, can't walk more than 200 feet gets short of breath  I certify that, based on my findings, the following services are medically necessary home health services:   Nursing Physical therapy    Reason for Medically Necessary Home Health Services:   Skilled Nursing- Complex Wound Care Therapy- Instruction on use of Assistive Device for Ambulation on all Surfaces Therapy- Instruction on Safe use of Assistive Devices for ADLs    My clinical findings support the need for the above services:  Unable to leave home safely without assistance and/or assistive device  Further, I certify that my clinical findings support that this patient is homebound due to:  Ambulates short distances less than 300 feet     Home Health    Complete by:  As directed   To provide the following care/treatments:   PT OT RN       Increase activity slowly    Complete by:  As directed   Walk with assistance use walker or cane as needed     Lifting restrictions    Complete by:  As directed   No lifting for 12 weeks     Resume previous diet    Complete by:  As directed      Walker     Complete by:  As directed             Medication List    TAKE these medications        amLODipine 10 MG tablet  Commonly known as:  NORVASC  Take 10 mg by mouth daily before breakfast.     atorvastatin 80 MG tablet  Commonly known as:  LIPITOR  Take 80 mg by mouth at bedtime.     carvedilol 12.5 MG tablet  Commonly known as:  COREG  Take 1 tablet (12.5 mg total) by mouth 2 (two) times daily with a meal.     carvedilol 6.25 MG tablet  Commonly known as:  COREG  Take 6.25 mg by mouth 2 (two) times  daily with a meal.     ezetimibe 10 MG tablet  Commonly known as:  ZETIA  Take 10 mg by mouth at bedtime.     fenofibrate 160 MG tablet  Take 160 mg by mouth at bedtime.     GOODY HEADACHE PO  Take 1 packet by mouth every hour as needed (for pain).     losartan-hydrochlorothiazide 100-25 MG per tablet  Commonly known as:  HYZAAR  Take 1 tablet by mouth daily before breakfast.     omeprazole 20 MG capsule  Commonly known as:  PRILOSEC  Take 20 mg by mouth at bedtime.     traMADol 50 MG tablet  Commonly known as:  ULTRAM  Take 1-2 tablets (50-100 mg total) by mouth every 6 (six) hours as needed for moderate pain.     Vitamin D3 5000 UNITS Caps  Take 5,000 Units by mouth daily.     warfarin 5 MG tablet  Commonly known as:  COUMADIN  Take 1 tablet (5 mg total) by mouth daily.       Verbal and written Discharge instructions given to the patient. Wound care per Discharge AVS     Follow-up Information    Follow up with [name] IV, [place], MD In 2 weeks.   Specialty:  Vascular Surgery   Contact information:  [address] [place] [address] [phone]       Follow up with [name], MD.   Specialty:  Cardiology   Why:  Coumadin check and office visit next week, the office will call.   Contact information:   [address] [place] [address] [phone]       Follow up with [facility].   Why:  HH-RN/PT/OT arranged   Contact information:   [address] [place] [address] [phone]       Signed: Laurence Slate [facility] 07/02/2014, 8:49 AM  - For VQI Registry use --- Instructions: Press F2 to tab through selections.  Delete question if not applicable.   Post-op:  Time to Extubation: [ ]  In OR, [ ]  < 12 hrs, [ ]  12-24 hrs, [x ] >=24 hrs [x]  re intubated Vasopressors Req. Post-op: Yes ICU Stay: 24 days Transfusion: Yes  If yes, 6 units PRBC and 5 units FFP units given MI: [x ] No, [ ]  Troponin  only, [ ]  EKG or Clinical New Arrhythmia: No  Complications: CHF: Yes Resp failure: [ ]  none, [ ]  Pneumonia, [x ] Ventilator Chg in renal function: [ ]  none, [x ] Inc. Cr > 0.5, [x ] Temp. Dialysis, [ ]  Permanent dialysis Leg ischemia: [x ] No, [ ]  Yes, no Surgery needed, [ ]  Yes, Surgery needed, [ ]  Amputation Bowel ischemia: [ ]  No, [ ]  Medical Rx, [ ]  Surgical Rx Wound complication: [ ]  No, [x ] Superficial separation/infection, [ ]  Return to OR Return to OR: No  Return to OR for bleeding: No Stroke: [x ] None, [ ]  Minor, [ ]  Major  Discharge medications: Statin use:  Yes ASA use:  No  for medical reason   Plavix use:  No  for medical reason   Beta blocker use: Yes Coumadin yes

## 2014-07-03 ENCOUNTER — Encounter: Payer: BLUE CROSS/BLUE SHIELD | Admitting: Pharmacist Clinician (PhC)/ Clinical Pharmacy Specialist

## 2014-07-05 ENCOUNTER — Ambulatory Visit (INDEPENDENT_AMBULATORY_CARE_PROVIDER_SITE_OTHER): Payer: BLUE CROSS/BLUE SHIELD | Admitting: Pharmacist Clinician (PhC)/ Clinical Pharmacy Specialist

## 2014-07-05 DIAGNOSIS — I480 Paroxysmal atrial fibrillation: Secondary | ICD-10-CM

## 2014-07-05 DIAGNOSIS — Z7901 Long term (current) use of anticoagulants: Secondary | ICD-10-CM

## 2014-07-05 LAB — POCT INR: INR: 3.9

## 2014-07-12 ENCOUNTER — Ambulatory Visit (INDEPENDENT_AMBULATORY_CARE_PROVIDER_SITE_OTHER): Payer: Medicare Other | Admitting: Pharmacist Clinician (PhC)/ Clinical Pharmacy Specialist

## 2014-07-12 ENCOUNTER — Telehealth: Payer: Self-pay | Admitting: Pharmacist Clinician (PhC)/ Clinical Pharmacy Specialist

## 2014-07-12 DIAGNOSIS — Z7901 Long term (current) use of anticoagulants: Secondary | ICD-10-CM

## 2014-07-12 DIAGNOSIS — I480 Paroxysmal atrial fibrillation: Secondary | ICD-10-CM

## 2014-07-12 LAB — PROTIME-INR
INR: 8.5 — AB (ref 0.9–1.1)
INR: 8.5 — AB (ref <=1.1)

## 2014-07-12 NOTE — Telephone Encounter (Signed)
See anticoag note

## 2014-07-12 NOTE — Telephone Encounter (Signed)
Judson Roch called in to report a critical INR for the pt at 8.25. Please call her back  Thanks

## 2014-07-15 ENCOUNTER — Ambulatory Visit (INDEPENDENT_AMBULATORY_CARE_PROVIDER_SITE_OTHER): Payer: Medicare Other | Admitting: Pharmacist Clinician (PhC)/ Clinical Pharmacy Specialist

## 2014-07-15 DIAGNOSIS — I480 Paroxysmal atrial fibrillation: Secondary | ICD-10-CM

## 2014-07-15 DIAGNOSIS — Z7901 Long term (current) use of anticoagulants: Secondary | ICD-10-CM

## 2014-07-15 LAB — POCT INR: INR: 4.8

## 2014-07-16 ENCOUNTER — Encounter: Payer: Self-pay | Admitting: Surgery

## 2014-07-17 ENCOUNTER — Ambulatory Visit (INDEPENDENT_AMBULATORY_CARE_PROVIDER_SITE_OTHER): Payer: Self-pay | Admitting: Surgery

## 2014-07-17 ENCOUNTER — Encounter: Payer: Self-pay | Admitting: Surgery

## 2014-07-17 VITALS — BP 142/80 | HR 63 | Ht 67.0 in | Wt 178.3 lb

## 2014-07-17 DIAGNOSIS — I714 Abdominal aortic aneurysm, without rupture, unspecified: Secondary | ICD-10-CM

## 2014-07-17 DIAGNOSIS — Z48812 Encounter for surgical aftercare following surgery on the circulatory system: Secondary | ICD-10-CM

## 2014-07-17 NOTE — Progress Notes (Signed)
This is the patient's first post operative visit.  He is status post aortobifemoral bypass with 16x8 graft and left renal artery bypass with 63mm graft.  This was done for claudication and an AAA as well as renal artery stenosis.  He was not a stent graft candidate secondary to previously placed iliac stents which were too small to advance a graft through.  He had a prolonged hospital course complicated by respiratory failure requiring intubation for approximately 2 weeks.  He also suffered acute renal failure which required dialysis for several days.  His renal function recovered to where his creatinine returned to near baseline around 2.  He is struggling with weakness and fatigue since his release from the hospital. He is currently at home.  He does not have much pain.  He is eating and having regular bowel movements.  He is doing wet to dry dressing changes to his groin incisions.  Both groin wounds are healing with pink granulation tissue.  I debrided a small area on his midline incision.  Overall, I think he is doing very well following his discharge.  He will continue wound care to his groins and follow up with me in 3 weeks.    Annamarie Major

## 2014-07-19 ENCOUNTER — Ambulatory Visit (INDEPENDENT_AMBULATORY_CARE_PROVIDER_SITE_OTHER): Payer: Medicare Other | Admitting: Pharmacist Clinician (PhC)/ Clinical Pharmacy Specialist

## 2014-07-19 ENCOUNTER — Other Ambulatory Visit: Payer: Self-pay

## 2014-07-19 DIAGNOSIS — Z7901 Long term (current) use of anticoagulants: Secondary | ICD-10-CM

## 2014-07-19 DIAGNOSIS — I480 Paroxysmal atrial fibrillation: Secondary | ICD-10-CM

## 2014-07-19 LAB — POCT INR: INR: 2.4

## 2014-07-19 MED ORDER — EZETIMIBE 10 MG PO TABS: 10.0000 mg | ORAL_TABLET | Freq: Every day | ORAL | Status: DC

## 2014-07-23 ENCOUNTER — Encounter: Payer: Self-pay | Admitting: Cardiovascular Disease

## 2014-07-24 ENCOUNTER — Telehealth: Payer: Self-pay | Admitting: Cardiovascular Disease

## 2014-07-24 ENCOUNTER — Ambulatory Visit (INDEPENDENT_AMBULATORY_CARE_PROVIDER_SITE_OTHER): Payer: Medicare Other | Admitting: Pharmacist Clinician (PhC)/ Clinical Pharmacy Specialist

## 2014-07-24 DIAGNOSIS — Z7901 Long term (current) use of anticoagulants: Secondary | ICD-10-CM

## 2014-07-24 DIAGNOSIS — I480 Paroxysmal atrial fibrillation: Secondary | ICD-10-CM

## 2014-07-24 LAB — POCT INR: INR: 4.1

## 2014-07-24 MED ORDER — WARFARIN SODIUM 2 MG PO TABS: 2.0000 mg | ORAL_TABLET | Freq: Every day | ORAL | Status: DC

## 2014-07-24 NOTE — Telephone Encounter (Signed)
Spoke with sarah, today patients INR is 4.1. Information given to kristin

## 2014-07-24 NOTE — Telephone Encounter (Signed)
See anticoag note

## 2014-07-26 ENCOUNTER — Encounter: Payer: Self-pay | Admitting: Cardiovascular Disease

## 2014-07-26 ENCOUNTER — Ambulatory Visit (INDEPENDENT_AMBULATORY_CARE_PROVIDER_SITE_OTHER): Payer: Medicare Other | Admitting: Pharmacist Clinician (PhC)/ Clinical Pharmacy Specialist

## 2014-07-26 ENCOUNTER — Ambulatory Visit (INDEPENDENT_AMBULATORY_CARE_PROVIDER_SITE_OTHER): Payer: Medicare Other | Admitting: Cardiovascular Disease

## 2014-07-26 VITALS — BP 122/60 | HR 62 | Ht 67.0 in | Wt 176.6 lb

## 2014-07-26 DIAGNOSIS — I2510 Atherosclerotic heart disease of native coronary artery without angina pectoris: Secondary | ICD-10-CM

## 2014-07-26 DIAGNOSIS — I119 Hypertensive heart disease without heart failure: Secondary | ICD-10-CM

## 2014-07-26 DIAGNOSIS — I4819 Other persistent atrial fibrillation: Secondary | ICD-10-CM

## 2014-07-26 DIAGNOSIS — I712 Thoracic aortic aneurysm, without rupture: Secondary | ICD-10-CM

## 2014-07-26 DIAGNOSIS — I481 Persistent atrial fibrillation: Secondary | ICD-10-CM

## 2014-07-26 DIAGNOSIS — I739 Peripheral vascular disease, unspecified: Secondary | ICD-10-CM

## 2014-07-26 DIAGNOSIS — I480 Paroxysmal atrial fibrillation: Secondary | ICD-10-CM

## 2014-07-26 DIAGNOSIS — I7121 Aneurysm of the ascending aorta, without rupture: Secondary | ICD-10-CM

## 2014-07-26 DIAGNOSIS — I2583 Coronary atherosclerosis due to lipid rich plaque: Principal | ICD-10-CM

## 2014-07-26 DIAGNOSIS — Z7901 Long term (current) use of anticoagulants: Secondary | ICD-10-CM

## 2014-07-26 LAB — POCT INR: INR: 2.4

## 2014-07-26 NOTE — Assessment & Plan Note (Signed)
Long-time history of tobacco abuse having repeat recently discontinued after his aortobifemoral bypass graft hospitalization

## 2014-07-26 NOTE — Assessment & Plan Note (Signed)
History of coronary artery bypass grafting by Dr. Prescott Gum 1996 with a LIMA to the LAD, vein to obtuse marginal branches 1 and 2, the acute marginal branch and diagonal branch.

## 2014-07-26 NOTE — Assessment & Plan Note (Signed)
History of hypertension with blood pressure measured at 120/60. He is on amlodipine and carvedilol. Continue current meds at current dosing

## 2014-07-26 NOTE — Assessment & Plan Note (Signed)
History of hyperlipidemia on atorvastatin 80 and fenofibrate. His most recent lipid profile performed in January revealed total vessel 74, LDL 25 and HDL of 12

## 2014-07-26 NOTE — Progress Notes (Signed)
07/26/2014 Barry Lawrence   Jun 01, 1945  [id number]  Primary Physician [name], MD Primary Cardiologist: [name] MD [name]   HPI: The patient is a 69 year old moderately-overweight married Caucasian male, father of 2 and grandfather to 8 grandchildren, whom I last saw 04/01/14 at the time of this peripheral angiogram.. He has a history of CAD, status post coronary artery bypass grafting in 1996 with a LIMA to his LAD, sequential vein to OM1 and 2 as well as acute marginal branch and diagonal branch. 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 [name] January 15, 2008. Carotid Dopplers performed in March of this year showed a widely patent endarterectomy site. He has had both iliacs stented as well as his SFAs in the past.. He has a large -sized abdominal aortic aneurysm measuring 5.5 x 5.5cm. We have been following this by duplex ultrasound. His other risk factors include hypertension, hyperlipidemia, and continued tobacco abuse of 2 packs a day, smoking cigars, recalcitrant to risk-factor modification. He has obstructive sleep apnea and is on CPAP.   Since I saw him back a year ago he complained of increasing shortness of breath but denies chest pain. He apparently did have a Myoview stress test a year ago at the [facility].he does continue to complain of claudication although his ABI is really haven't changed and the stents appear to be open.  I performed peripheral angiography on him because of a enlarging abdominal aortic aneurysm 04/01/14 revealed a 90% left renal artery stenosis, large infrarenal abdominal aortic aneurysm and patent iliac stents. He ultimately underwent aortobifemoral bypass grafting by Dr. [name] 05/31/14 with a left renal bypass. His hospital course was compensated and prolonged. He had respiratory insufficiency with prolonged intubation, acute renal failure and atrial fibrillation ultimately  requiring Coumadin anticoagulation. His renal function ultimately improved with a creatinine around the 2 range. He saw Dr. [name] back in the office last month. Post first postop visit and his wounds were slowly healing. He has stopped smoking.     Current Outpatient Prescriptions  Medication Sig Dispense Refill  . amLODipine (NORVASC) 10 MG tablet Take 10 mg by mouth daily before breakfast.     . Aspirin-Acetaminophen-Caffeine (GOODY HEADACHE PO) Take 1 packet by mouth every hour as needed (for pain).     [name] atorvastatin (LIPITOR) 80 MG tablet Take 80 mg by mouth at bedtime.     . carvedilol (COREG) 12.5 MG tablet Take 1 tablet (12.5 mg total) by mouth 2 (two) times daily with a meal. 60 tablet 1  . Cholecalciferol (VITAMIN D3) 5000 UNITS CAPS Take 5,000 Units by mouth daily.    [name] ezetimibe (ZETIA) 10 MG tablet Take 1 tablet (10 mg total) by mouth at bedtime. 30 tablet 6  . fenofibrate 160 MG tablet Take 160 mg by mouth at bedtime.     [name] losartan-hydrochlorothiazide (HYZAAR) 100-25 MG per tablet Take 1 tablet by mouth daily before breakfast.     . omeprazole (PRILOSEC) 20 MG capsule Take 20 mg by mouth at bedtime.     . traMADol (ULTRAM) 50 MG tablet Take 1-2 tablets (50-100 mg total) by mouth every 6 (six) hours as needed for moderate pain. 40 tablet 0  . warfarin (COUMADIN) 2 MG tablet Take 1 tablet (2 mg total) by mouth daily. 30 tablet 3   No current facility-administered medications for this visit.    Allergies  Allergen Reactions  . Niaspan [  Niacin Er] Hives    History   Social History  . Marital Status: Married    Spouse Name: N/A  . Number of Children: 2  . Years of Education: N/A   Occupational History  . Retired Estate manager/land agent    Social History Main Topics  . Smoking status: Former Smoker -- 1.00 packs/day for 56 years    Types: Cigarettes    Quit date: 05/30/2014  . Smokeless tobacco: Never Used  . Alcohol Use: No  . Drug Use: No  . Sexual Activity: Not on  file   Other Topics Concern  . Not on file   Social History Narrative   Lives with wife.     Review of Systems: General: negative for chills, fever, night sweats or weight changes.  Cardiovascular: negative for chest pain, dyspnea on exertion, edema, orthopnea, palpitations, paroxysmal nocturnal dyspnea or shortness of breath Dermatological: negative for rash Respiratory: negative for cough or wheezing Urologic: negative for hematuria Abdominal: negative for nausea, vomiting, diarrhea, bright red blood per rectum, melena, or hematemesis Neurologic: negative for visual changes, syncope, or dizziness All other systems reviewed and are otherwise negative except as noted above.    Blood pressure 122/60, pulse 62, height 5\' 7"  (1.702 m), weight 176 lb 9.6 oz (80.105 kg).  General appearance: alert and no distress Neck: no adenopathy, no JVD, supple, symmetrical, trachea midline, thyroid not enlarged, symmetric, no tenderness/mass/nodules and soft bilateral carotid bruits Lungs: clear to auscultation bilaterally Heart: irregularly irregular rhythm Extremities: extremities normal, atraumatic, no cyanosis or edema  EKG not performed today  ASSESSMENT AND PLAN:   Thoracic ascending aortic aneurysm History of thoracic aortic aneurysm measuring 4.2 cm by CTA performed 03/05/14   PAD (peripheral artery disease) History of bilateral iliac stenting back in 1997. He recently had aortobifemoral bypass grafting making the stents no longer relevant.   Hypertensive heart disease History of hypertension with blood pressure measured at 120/60. He is on amlodipine and carvedilol. Continue current meds at current dosing   Hyperlipidemia History of hyperlipidemia on atorvastatin 80 and fenofibrate. His most recent lipid profile performed in January revealed total vessel 74, LDL 25 and HDL of 12   Cigarette smoker Long-time history of tobacco abuse having repeat recently discontinued after  his aortobifemoral bypass graft hospitalization   Carotid artery disease History of carotid artery disease status post endarterectomy performed by Dr. [name] 01/15/08. We follow this on an annual basis   CAD (coronary artery disease) History of coronary artery bypass grafting by Dr. Prescott Gum 1996 with a LIMA to the LAD, vein to obtuse marginal branches 1 and 2, the acute marginal branch and diagonal branch.   Atrial fibrillation Reason history of atrial fibrillation rate controlled on Coumadin anticoagulation. He is interested in exploring noval  oral anticoagulation options       [name] MD [facility], [facility] 07/26/2014 3:29 PM

## 2014-07-26 NOTE — Assessment & Plan Note (Signed)
History of carotid artery disease status post endarterectomy performed by Dr. [name] 01/15/08. We follow this on an annual basis

## 2014-07-26 NOTE — Assessment & Plan Note (Signed)
Reason history of atrial fibrillation rate controlled on Coumadin anticoagulation. He is interested in exploring noval  oral anticoagulation options

## 2014-07-26 NOTE — Assessment & Plan Note (Signed)
History of bilateral iliac stenting back in 1997. He recently had aortobifemoral bypass grafting making the stents no longer relevant.

## 2014-07-26 NOTE — Assessment & Plan Note (Signed)
History of thoracic aortic aneurysm measuring 4.2 cm by CTA performed 03/05/14

## 2014-07-26 NOTE — Patient Instructions (Signed)
Abdominal Aorta Doppler (in 2 months). During this test, an ultrasound is used to evaluate the aorta. Allow 30 minutes for this exam. Do NOT eat after midnight the day before and AVOID carbonated beverages.  Carotid Doppler (in 2 months) - This test is an ultrasound of the carotid arteries in your neck. It looks at blood flow through these arteries that supply the brain with blood. Allow one hour for this exam. There are no restrictions or special instructions.  We request that you follow-up in: 3 months with an extender and in 6 months with Dr Gwenlyn Found.  You will receive a reminder letter in the mail two months in advance. If you don't receive a letter, please call our office to schedule the follow-up appointment.

## 2014-07-30 ENCOUNTER — Other Ambulatory Visit: Payer: Self-pay | Admitting: *Deleted

## 2014-07-30 DIAGNOSIS — Z95828 Presence of other vascular implants and grafts: Secondary | ICD-10-CM

## 2014-07-30 DIAGNOSIS — I739 Peripheral vascular disease, unspecified: Secondary | ICD-10-CM

## 2014-07-31 ENCOUNTER — Ambulatory Visit (INDEPENDENT_AMBULATORY_CARE_PROVIDER_SITE_OTHER): Payer: Medicare Other | Admitting: Pharmacist Clinician (PhC)/ Clinical Pharmacy Specialist

## 2014-07-31 DIAGNOSIS — Z7901 Long term (current) use of anticoagulants: Secondary | ICD-10-CM

## 2014-07-31 DIAGNOSIS — I481 Persistent atrial fibrillation: Secondary | ICD-10-CM

## 2014-07-31 DIAGNOSIS — I4819 Other persistent atrial fibrillation: Secondary | ICD-10-CM

## 2014-07-31 LAB — POCT INR: INR: 3.2

## 2014-08-05 ENCOUNTER — Other Ambulatory Visit: Payer: Self-pay | Admitting: *Deleted

## 2014-08-05 MED ORDER — EZETIMIBE 10 MG PO TABS: 10.0000 mg | ORAL_TABLET | Freq: Every day | ORAL | Status: DC

## 2014-08-05 NOTE — Telephone Encounter (Signed)
ERX sent to pharmacy 

## 2014-08-06 ENCOUNTER — Telehealth: Payer: Self-pay | Admitting: Cardiovascular Disease

## 2014-08-06 NOTE — Telephone Encounter (Signed)
Returned call to patient will send message to [facility] for advice.

## 2014-08-06 NOTE — Telephone Encounter (Signed)
Pt wants to change his Zetia,it is too expensive.

## 2014-08-06 NOTE — Telephone Encounter (Signed)
Appointment to see Barry Lawrence. Is he a candidate for PCSK9.

## 2014-08-07 ENCOUNTER — Ambulatory Visit (INDEPENDENT_AMBULATORY_CARE_PROVIDER_SITE_OTHER): Payer: Medicare Other | Admitting: Pharmacist Clinician (PhC)/ Clinical Pharmacy Specialist

## 2014-08-07 ENCOUNTER — Other Ambulatory Visit: Payer: Self-pay | Admitting: *Deleted

## 2014-08-07 DIAGNOSIS — I4819 Other persistent atrial fibrillation: Secondary | ICD-10-CM

## 2014-08-07 DIAGNOSIS — I481 Persistent atrial fibrillation: Secondary | ICD-10-CM

## 2014-08-07 DIAGNOSIS — Z7901 Long term (current) use of anticoagulants: Secondary | ICD-10-CM

## 2014-08-07 LAB — POCT INR: INR: 2.3

## 2014-08-07 MED ORDER — CARVEDILOL 12.5 MG PO TABS: 12.5000 mg | ORAL_TABLET | Freq: Two times a day (BID) | ORAL | Status: DC

## 2014-08-07 NOTE — Telephone Encounter (Signed)
ERX sent to pharmacy 

## 2014-08-07 NOTE — Telephone Encounter (Signed)
I spoke with patient and Barry Lawrence.  His last LDL was 25, so Barry Lawrence gave a verbal order to patient to stop zetia.  He verbalized understanding.

## 2014-08-07 NOTE — Telephone Encounter (Signed)
Phone busy.

## 2014-08-08 ENCOUNTER — Other Ambulatory Visit: Payer: Self-pay | Admitting: Internal Medicine

## 2014-08-08 DIAGNOSIS — R911 Solitary pulmonary nodule: Secondary | ICD-10-CM

## 2014-08-09 ENCOUNTER — Encounter: Payer: Self-pay | Admitting: Surgery

## 2014-08-12 ENCOUNTER — Ambulatory Visit (INDEPENDENT_AMBULATORY_CARE_PROVIDER_SITE_OTHER): Payer: Self-pay | Admitting: Surgery

## 2014-08-12 ENCOUNTER — Encounter: Payer: Self-pay | Admitting: Surgery

## 2014-08-12 ENCOUNTER — Ambulatory Visit (HOSPITAL_COMMUNITY)
Admission: RE | Admit: 2014-08-12 | Discharge: 2014-08-12 | Disposition: A | Discharge status: Home / Self Care | Payer: Medicare Other | Source: Ambulatory Visit | Attending: Surgery | Admitting: Surgery

## 2014-08-12 VITALS — BP 142/83 | HR 62 | Ht 67.0 in | Wt 179.0 lb

## 2014-08-12 DIAGNOSIS — Z48812 Encounter for surgical aftercare following surgery on the circulatory system: Secondary | ICD-10-CM

## 2014-08-12 DIAGNOSIS — Z9889 Other specified postprocedural states: Secondary | ICD-10-CM

## 2014-08-12 DIAGNOSIS — I701 Atherosclerosis of renal artery: Secondary | ICD-10-CM

## 2014-08-12 DIAGNOSIS — I739 Peripheral vascular disease, unspecified: Secondary | ICD-10-CM | POA: Diagnosis not present

## 2014-08-12 DIAGNOSIS — Z95828 Presence of other vascular implants and grafts: Secondary | ICD-10-CM

## 2014-08-12 NOTE — Progress Notes (Signed)
Patient name: Barry Lawrence MRN: [place]:[id number] DOB: 1946/04/10 Sex: male     Chief Complaint  Patient presents with  . Re-evaluation    3 wk f/u - wound check    HISTORY OF PRESENT ILLNESS: The patient is back for follow-up.  He is status post aortobifemoral bypass with 16x8 graft and left renal artery bypass with 68mm graft. This was done for claudication and an AAA as well as renal artery stenosis. He was not a stent graft candidate secondary to previously placed iliac stents which were too small to advance a graft through. He had a prolonged hospital course complicated by respiratory failure requiring intubation for approximately 2 weeks. He also suffered acute renal failure which required dialysis for several days. His renal function recovered to where his creatinine returned to near baseline around 2.  He reports decreased energy but this has improved.  He continues to do twice daily wet to dry dressing changes to his groins.  He is not having trouble with his appetite or significant limitations with bowel function  Past Medical History  Diagnosis Date  . CAD (coronary artery disease)     myoview 04/21/11-normal, EF49%  . S/P CABG (coronary artery bypass graft) 1996  . Hyperlipemia   . Polycythemia     H/O  . GERD (gastroesophageal reflux disease)   . Tobacco abuse   . CKD (chronic kidney disease), stage III   . Myocardial infarction 1996  . OSA on CPAP     CPAP q night , CPAP is through [place], states doesn't remember when he had the last study  . Pneumonia     hosp. as a child with pneumonia, not since   . Diabetes     BORDERLINE  . Headache     uses [employer], states he is lessening what he is taking for prep. for surgery   . Arthritis     in his back  . Hypertension     stress test- done 04/2014, followed by Dr. Gwenlyn Found  . Carotid stenosis 01/15/08    right endarterectomy-Dr. Amedeo Plenty  . PAD (peripheral artery disease) 06/12/09    a. 11/22/07 PTA & stenting right  external iliac artery;bilateral iliac & PTI and stenting 1997;right ICA =>50% reduction,right SFA >50%,left ICA at stent => 50% reduction,left EIA => 50% reduction,left ATA occluded, left SFA mid > 49% reduction;  03/2014 Angio: LRA 90, patent L Iliac stent and distal RCIA stent, large saccular AAA.  [name] Abdominal aortic aneurysm 01/28/10; 02/05/14    4.3x4.6cm;  now measuring 5.5 cm, status post aortobifemoral bypass grafting in January 2016 by Dr. problem along with left renal artery bypass    Past Surgical History  Procedure Laterality Date  . Carotid endarterectomy Right 01/15/08  . Lumbar disc surgery  1998  . Pv angio  11/22/2007    PTA/ stenting of right common iliac artery with a 10x100mm Smart stent and postdilated with a 7x49mmpowerflex balloon; high grade calcified stenosis of the RICA  . Colonoscopy N/A 07/09/2013    Procedure: COLONOSCOPY;  Surgeon: Juanita Craver, MD;  Location: [facility];  Service: Endoscopy;  Laterality: N/A;  . Pv angio  04/01/14    AAA, Lt renal artery stenosis, patent iliacs  . Abdominal aortagram N/A 04/01/2014    Procedure: ABDOMINAL Maxcine Ham;  Surgeon: [name], MD;  Location: [facility] CATH LAB;  Service: Cardiovascular;  Laterality: N/A;  . Back surgery  1988    at [facility]  . Coronary artery  bypass graft  1996    LIMA to LAD, sequential vein to OM1 and 2 as well as acure marginal branch and diag branch  . Aorta - bilateral femoral artery bypass graft N/A 05/30/2014    Procedure: AORTOBIFEMORAL BYPASS GRAFT;  Surgeon: Serafina Mitchell, MD;  Location: [place];  Service: Vascular;  Laterality: N/A;  . Aortic/renal bypass Left 05/30/2014    Procedure: LEFT RENAL ARTERY BYPASS;  Surgeon: Serafina Mitchell, MD;  Location: [place];  Service: Vascular;  Laterality: Left;    History   Social History  . Marital Status: Married    Spouse Name: N/A  . Number of Children: 2  . Years of Education: N/A   Occupational History  . Retired Estate manager/land agent    Social History Main  Topics  . Smoking status: Former Smoker -- 1.00 packs/day for 56 years    Types: Cigarettes    Quit date: 05/30/2014  . Smokeless tobacco: Never Used  . Alcohol Use: No  . Drug Use: No  . Sexual Activity: Not on file   Other Topics Concern  . Not on file   Social History Narrative   Lives with wife.    Family History  Problem Relation Age of Onset  . Heart failure Father     Allergies as of 08/12/2014 - Review Complete 08/12/2014  Allergen Reaction Noted  . Niaspan [niacin er] Hives 07/15/2012    Current Outpatient Prescriptions on File Prior to Visit  Medication Sig Dispense Refill  . amLODipine (NORVASC) 10 MG tablet Take 10 mg by mouth daily before breakfast.     . Aspirin-Acetaminophen-Caffeine (GOODY HEADACHE PO) Take 1 packet by mouth every hour as needed (for pain).     [name] atorvastatin (LIPITOR) 80 MG tablet Take 80 mg by mouth at bedtime.     . carvedilol (COREG) 12.5 MG tablet Take 1 tablet (12.5 mg total) by mouth 2 (two) times daily with a meal. 60 tablet 11  . Cholecalciferol (VITAMIN D3) 5000 UNITS CAPS Take 5,000 Units by mouth daily.    . fenofibrate 160 MG tablet Take 160 mg by mouth at bedtime.     [name] losartan-hydrochlorothiazide (HYZAAR) 100-25 MG per tablet Take 1 tablet by mouth daily before breakfast.     . omeprazole (PRILOSEC) 20 MG capsule Take 20 mg by mouth at bedtime.     . traMADol (ULTRAM) 50 MG tablet Take 1-2 tablets (50-100 mg total) by mouth every 6 (six) hours as needed for moderate pain. 40 tablet 0  . warfarin (COUMADIN) 2 MG tablet Take 1 tablet (2 mg total) by mouth daily. 30 tablet 3   No current facility-administered medications on file prior to visit.     REVIEW OF SYSTEMS: No changes  PHYSICAL EXAMINATION:   Vital signs are  Filed Vitals:   08/12/14 1345 08/12/14 1421  BP: 159/77 142/83  Pulse: 62   Height: 5\' 7"  (1.702 m)   Weight: 179 lb (81.194 kg)   SpO2: 98%    Body mass index is 28.03 kg/(m^2). General: The patient  appears their stated age. HEENT:  No gross abnormalities Pulmonary:  Non labored breathing Abdomen: Diastases, no hernia Musculoskeletal: There are no major deformities. Neurologic: No focal weakness or paresthesias are detected, Skin: Groin incisions have nearly completely healed Psychiatric: The patient has normal affect. Cardiovascular: Palpable pedal pulses   Diagnostic Studies Normal ankle-brachial indices bilaterally  Assessment: Status post aortobifemoral bypass graft with left renal bypass for aneurysmal disease and  claudication Plan: The patient continues to recover as expected.   He will get a CT chest to follow-up a pulmonary nodule We'll follow up with me in 3 months with a duplex of his renal artery bypass Blood pressure to be followed by primary care physician  V. Leia Alf, M.D. [facility] Office: [phone] Pager:  [phone]

## 2014-08-13 NOTE — Addendum Note (Signed)
Addended by: Mena Goes on: 08/13/2014 12:47 PM   Modules accepted: Orders

## 2014-08-14 ENCOUNTER — Ambulatory Visit (INDEPENDENT_AMBULATORY_CARE_PROVIDER_SITE_OTHER): Payer: Medicare Other | Admitting: Pharmacist Clinician (PhC)/ Clinical Pharmacy Specialist

## 2014-08-14 DIAGNOSIS — Z7901 Long term (current) use of anticoagulants: Secondary | ICD-10-CM

## 2014-08-14 DIAGNOSIS — I4819 Other persistent atrial fibrillation: Secondary | ICD-10-CM

## 2014-08-14 DIAGNOSIS — I481 Persistent atrial fibrillation: Secondary | ICD-10-CM

## 2014-08-14 LAB — POCT INR: INR: 2.1

## 2014-08-26 ENCOUNTER — Ambulatory Visit (INDEPENDENT_AMBULATORY_CARE_PROVIDER_SITE_OTHER): Payer: Medicare Other | Admitting: Pharmacist Clinician (PhC)/ Clinical Pharmacy Specialist

## 2014-08-26 DIAGNOSIS — Z7901 Long term (current) use of anticoagulants: Secondary | ICD-10-CM

## 2014-08-26 DIAGNOSIS — I4819 Other persistent atrial fibrillation: Secondary | ICD-10-CM

## 2014-08-26 DIAGNOSIS — I481 Persistent atrial fibrillation: Secondary | ICD-10-CM

## 2014-08-26 LAB — POCT INR: INR: 1.8

## 2014-09-04 ENCOUNTER — Ambulatory Visit (HOSPITAL_COMMUNITY)
Admission: RE | Admit: 2014-09-04 | Discharge: 2014-09-04 | Disposition: A | Discharge status: Home / Self Care | Payer: Medicare Other | Source: Ambulatory Visit | Attending: Internal Medicine | Admitting: Internal Medicine

## 2014-09-04 ENCOUNTER — Encounter: Payer: Self-pay | Admitting: Internal Medicine

## 2014-09-04 DIAGNOSIS — R918 Other nonspecific abnormal finding of lung field: Secondary | ICD-10-CM | POA: Insufficient documentation

## 2014-09-04 DIAGNOSIS — R0602 Shortness of breath: Secondary | ICD-10-CM | POA: Insufficient documentation

## 2014-09-04 DIAGNOSIS — R531 Weakness: Secondary | ICD-10-CM | POA: Insufficient documentation

## 2014-09-04 DIAGNOSIS — R911 Solitary pulmonary nodule: Secondary | ICD-10-CM

## 2014-09-09 ENCOUNTER — Ambulatory Visit (INDEPENDENT_AMBULATORY_CARE_PROVIDER_SITE_OTHER): Payer: Medicare Other | Admitting: *Deleted

## 2014-09-09 DIAGNOSIS — I480 Paroxysmal atrial fibrillation: Secondary | ICD-10-CM | POA: Diagnosis not present

## 2014-09-09 DIAGNOSIS — Z7901 Long term (current) use of anticoagulants: Secondary | ICD-10-CM | POA: Diagnosis not present

## 2014-09-09 LAB — POCT INR: INR: 1.8

## 2014-09-18 LAB — POCT INR: INR: 3.1

## 2014-09-19 ENCOUNTER — Telehealth: Payer: Self-pay | Admitting: *Deleted

## 2014-09-19 ENCOUNTER — Ambulatory Visit (INDEPENDENT_AMBULATORY_CARE_PROVIDER_SITE_OTHER): Payer: Medicare Other | Admitting: *Deleted

## 2014-09-19 DIAGNOSIS — I4891 Unspecified atrial fibrillation: Secondary | ICD-10-CM

## 2014-09-19 DIAGNOSIS — Z7901 Long term (current) use of anticoagulants: Secondary | ICD-10-CM

## 2014-09-19 NOTE — Telephone Encounter (Signed)
See coumadin note. 

## 2014-09-19 NOTE — Telephone Encounter (Signed)
Please call patient in regards to INR done @ [place]. / tg

## 2014-09-24 ENCOUNTER — Other Ambulatory Visit: Payer: Self-pay | Admitting: Cardiovascular Disease

## 2014-09-24 DIAGNOSIS — I714 Abdominal aortic aneurysm, without rupture, unspecified: Secondary | ICD-10-CM

## 2014-09-24 DIAGNOSIS — I739 Peripheral vascular disease, unspecified: Secondary | ICD-10-CM

## 2014-09-30 ENCOUNTER — Ambulatory Visit (INDEPENDENT_AMBULATORY_CARE_PROVIDER_SITE_OTHER): Payer: Medicare Other | Admitting: *Deleted

## 2014-09-30 DIAGNOSIS — I480 Paroxysmal atrial fibrillation: Secondary | ICD-10-CM

## 2014-09-30 DIAGNOSIS — Z7901 Long term (current) use of anticoagulants: Secondary | ICD-10-CM | POA: Diagnosis not present

## 2014-09-30 DIAGNOSIS — I4891 Unspecified atrial fibrillation: Secondary | ICD-10-CM

## 2014-09-30 LAB — POCT INR: INR: 1.4

## 2014-10-06 ENCOUNTER — Inpatient Hospital Stay (HOSPITAL_COMMUNITY)
Admission: EM | Admit: 2014-10-06 | Discharge: 2014-10-08 | DRG: 603 | Disposition: A | Discharge status: Home / Self Care | Payer: Medicare Other | Attending: Internal Medicine | Admitting: Internal Medicine

## 2014-10-06 ENCOUNTER — Encounter (HOSPITAL_COMMUNITY): Payer: Self-pay | Admitting: Emergency Medicine

## 2014-10-06 ENCOUNTER — Inpatient Hospital Stay (HOSPITAL_COMMUNITY): Payer: Medicare Other

## 2014-10-06 DIAGNOSIS — I480 Paroxysmal atrial fibrillation: Secondary | ICD-10-CM | POA: Diagnosis not present

## 2014-10-06 DIAGNOSIS — L03116 Cellulitis of left lower limb: Secondary | ICD-10-CM | POA: Diagnosis present

## 2014-10-06 DIAGNOSIS — G4733 Obstructive sleep apnea (adult) (pediatric): Secondary | ICD-10-CM | POA: Diagnosis present

## 2014-10-06 DIAGNOSIS — J449 Chronic obstructive pulmonary disease, unspecified: Secondary | ICD-10-CM | POA: Diagnosis present

## 2014-10-06 DIAGNOSIS — Z951 Presence of aortocoronary bypass graft: Secondary | ICD-10-CM

## 2014-10-06 DIAGNOSIS — I4891 Unspecified atrial fibrillation: Secondary | ICD-10-CM | POA: Diagnosis present

## 2014-10-06 DIAGNOSIS — E119 Type 2 diabetes mellitus without complications: Secondary | ICD-10-CM | POA: Diagnosis present

## 2014-10-06 DIAGNOSIS — I252 Old myocardial infarction: Secondary | ICD-10-CM | POA: Diagnosis not present

## 2014-10-06 DIAGNOSIS — I1310 Hypertensive heart and chronic kidney disease without heart failure, with stage 1 through stage 4 chronic kidney disease, or unspecified chronic kidney disease: Secondary | ICD-10-CM | POA: Diagnosis present

## 2014-10-06 DIAGNOSIS — Z7901 Long term (current) use of anticoagulants: Secondary | ICD-10-CM | POA: Diagnosis not present

## 2014-10-06 DIAGNOSIS — N183 Chronic kidney disease, stage 3 (moderate): Secondary | ICD-10-CM | POA: Diagnosis not present

## 2014-10-06 DIAGNOSIS — Z8249 Family history of ischemic heart disease and other diseases of the circulatory system: Secondary | ICD-10-CM | POA: Diagnosis not present

## 2014-10-06 DIAGNOSIS — R059 Cough, unspecified: Secondary | ICD-10-CM

## 2014-10-06 DIAGNOSIS — F17210 Nicotine dependence, cigarettes, uncomplicated: Secondary | ICD-10-CM | POA: Diagnosis present

## 2014-10-06 DIAGNOSIS — I2510 Atherosclerotic heart disease of native coronary artery without angina pectoris: Secondary | ICD-10-CM | POA: Diagnosis present

## 2014-10-06 DIAGNOSIS — E785 Hyperlipidemia, unspecified: Secondary | ICD-10-CM | POA: Diagnosis present

## 2014-10-06 DIAGNOSIS — R509 Fever, unspecified: Secondary | ICD-10-CM | POA: Diagnosis present

## 2014-10-06 DIAGNOSIS — I739 Peripheral vascular disease, unspecified: Secondary | ICD-10-CM | POA: Diagnosis present

## 2014-10-06 DIAGNOSIS — L0390 Cellulitis, unspecified: Secondary | ICD-10-CM | POA: Diagnosis present

## 2014-10-06 DIAGNOSIS — K219 Gastro-esophageal reflux disease without esophagitis: Secondary | ICD-10-CM | POA: Diagnosis present

## 2014-10-06 DIAGNOSIS — N179 Acute kidney failure, unspecified: Secondary | ICD-10-CM | POA: Diagnosis present

## 2014-10-06 DIAGNOSIS — R05 Cough: Secondary | ICD-10-CM

## 2014-10-06 DIAGNOSIS — Z720 Tobacco use: Secondary | ICD-10-CM | POA: Diagnosis not present

## 2014-10-06 DIAGNOSIS — N184 Chronic kidney disease, stage 4 (severe): Secondary | ICD-10-CM | POA: Diagnosis present

## 2014-10-06 DIAGNOSIS — I119 Hypertensive heart disease without heart failure: Secondary | ICD-10-CM | POA: Diagnosis present

## 2014-10-06 DIAGNOSIS — N189 Chronic kidney disease, unspecified: Secondary | ICD-10-CM

## 2014-10-06 LAB — COMPREHENSIVE METABOLIC PANEL
ALT: 19 U/L (ref 17–63)
AST: 20 U/L (ref 15–41)
Albumin: 3.6 g/dL (ref 3.5–5.0)
Alkaline Phosphatase: 34 U/L — ABNORMAL LOW (ref 38–126)
Anion gap: 11 (ref 5–15)
BUN: 62 mg/dL — ABNORMAL HIGH (ref 6–20)
CO2: 25 mmol/L (ref 22–32)
Calcium: 9.4 mg/dL (ref 8.9–10.3)
Chloride: 101 mmol/L (ref 101–111)
Creatinine, Ser: 3.34 mg/dL — ABNORMAL HIGH (ref 0.61–1.24)
GFR calc Af Amer: 20 mL/min — ABNORMAL LOW (ref >60)
GFR calc non Af Amer: 18 mL/min — ABNORMAL LOW (ref >60)
Glucose, Bld: 123 mg/dL — ABNORMAL HIGH (ref 65–99)
Potassium: 4.1 mmol/L (ref 3.5–5.1)
Sodium: 137 mmol/L (ref 135–145)
Total Bilirubin: 0.5 mg/dL (ref 0.3–1.2)
Total Protein: 7.5 g/dL (ref 6.5–8.1)

## 2014-10-06 LAB — CBC WITH DIFFERENTIAL/PLATELET
Basophils Absolute: 0 10*3/uL (ref 0.0–0.1)
Basophils Relative: 1 % (ref 0–1)
Eosinophils Absolute: 0.2 10*3/uL (ref 0.0–0.7)
Eosinophils Relative: 2 % (ref 0–5)
HCT: 42.9 % (ref 39.0–52.0)
Hemoglobin: 14.4 g/dL (ref 13.0–17.0)
Lymphocytes Relative: 24 % (ref 12–46)
Lymphs Abs: 2 10*3/uL (ref 0.7–4.0)
MCH: 29.4 pg (ref 26.0–34.0)
MCHC: 33.6 g/dL (ref 30.0–36.0)
MCV: 87.6 fL (ref 78.0–100.0)
Monocytes Absolute: 0.9 10*3/uL (ref 0.1–1.0)
Monocytes Relative: 11 % (ref 3–12)
Neutro Abs: 5.2 10*3/uL (ref 1.7–7.7)
Neutrophils Relative %: 62 % (ref 43–77)
Platelets: 254 10*3/uL (ref 150–400)
RBC: 4.9 MIL/uL (ref 4.22–5.81)
RDW: 16.5 % — ABNORMAL HIGH (ref 11.5–15.5)
WBC: 8.2 10*3/uL (ref 4.0–10.5)

## 2014-10-06 LAB — CBG MONITORING, ED: Glucose-Capillary: 110 mg/dL — ABNORMAL HIGH (ref 65–99)

## 2014-10-06 LAB — GLUCOSE, CAPILLARY: Glucose-Capillary: 144 mg/dL — ABNORMAL HIGH (ref 65–99)

## 2014-10-06 LAB — PROTIME-INR
INR: 1.95 — ABNORMAL HIGH (ref 0.00–1.49)
Prothrombin Time: 22.4 seconds — ABNORMAL HIGH (ref 11.6–15.2)

## 2014-10-06 MED ORDER — NICOTINE 14 MG/24HR TD PT24
14.0000 mg | MEDICATED_PATCH | Freq: Every day | TRANSDERMAL | Status: DC
  Filled 2014-10-06: qty 1

## 2014-10-06 MED ORDER — AMLODIPINE BESYLATE 5 MG PO TABS
10.0000 mg | ORAL_TABLET | Freq: Every day | ORAL | Status: DC
  Administered 2014-10-07 – 2014-10-08 (×2): 10 mg via ORAL
  Filled 2014-10-06 (×2): qty 2

## 2014-10-06 MED ORDER — FUROSEMIDE 40 MG PO TABS
40.0000 mg | ORAL_TABLET | Freq: Two times a day (BID) | ORAL | Status: DC
  Administered 2014-10-07: 40 mg via ORAL
  Filled 2014-10-06 (×2): qty 1

## 2014-10-06 MED ORDER — WARFARIN - PHARMACIST DOSING INPATIENT: Freq: Every day | Status: DC

## 2014-10-06 MED ORDER — WARFARIN SODIUM 2 MG PO TABS: 2.0000 mg | ORAL_TABLET | Freq: Once | ORAL | Status: DC

## 2014-10-06 MED ORDER — FENOFIBRATE 160 MG PO TABS
160.0000 mg | ORAL_TABLET | Freq: Every day | ORAL | Status: DC
  Administered 2014-10-06: 160 mg via ORAL
  Filled 2014-10-06 (×2): qty 1

## 2014-10-06 MED ORDER — WARFARIN SODIUM 1 MG PO TABS
1.0000 mg | ORAL_TABLET | Freq: Once | ORAL | Status: AC
  Administered 2014-10-06: 1 mg via ORAL
  Filled 2014-10-06: qty 1

## 2014-10-06 MED ORDER — TRAMADOL HCL 50 MG PO TABS: 50.0000 mg | ORAL_TABLET | Freq: Four times a day (QID) | ORAL | Status: DC | PRN

## 2014-10-06 MED ORDER — ATORVASTATIN CALCIUM 40 MG PO TABS
80.0000 mg | ORAL_TABLET | Freq: Every day | ORAL | Status: DC
  Administered 2014-10-06 – 2014-10-07 (×2): 80 mg via ORAL
  Filled 2014-10-06 (×2): qty 2

## 2014-10-06 MED ORDER — VANCOMYCIN HCL IN DEXTROSE 1-5 GM/200ML-% IV SOLN
1000.0000 mg | INTRAVENOUS | Status: DC
  Administered 2014-10-06: 1000 mg via INTRAVENOUS
  Filled 2014-10-06 (×2): qty 200

## 2014-10-06 MED ORDER — CARVEDILOL 12.5 MG PO TABS
12.5000 mg | ORAL_TABLET | Freq: Two times a day (BID) | ORAL | Status: DC
  Administered 2014-10-06 – 2014-10-07 (×2): 12.5 mg via ORAL
  Filled 2014-10-06 (×3): qty 1

## 2014-10-06 MED ORDER — SODIUM CHLORIDE 0.9 % IJ SOLN
3.0000 mL | Freq: Two times a day (BID) | INTRAMUSCULAR | Status: DC
  Administered 2014-10-07 – 2014-10-08 (×3): 3 mL via INTRAVENOUS

## 2014-10-06 MED ORDER — PANTOPRAZOLE SODIUM 40 MG PO TBEC
40.0000 mg | DELAYED_RELEASE_TABLET | Freq: Every day | ORAL | Status: DC
  Administered 2014-10-07 (×2): 40 mg via ORAL
  Filled 2014-10-06 (×3): qty 1

## 2014-10-06 NOTE — H&P (Signed)
History and Physical    Barry Lawrence [id number] DOB: [birth year] DOA: 10/06/2014  Referring physician: Dr. Lacinda Axon PCP: [name], MD  Specialists: none  Chief Complaint: LLE redness  HPI: Barry Lawrence is a 69 y.o. male has a past medical history significant for coronary artery disease status post CABG, peripheral arterial disease status post aortobifemoral bypass by vascular surgery, presents to the emergency room with a chief complaint of left lower extremity redness that has been progressively worsening over the last few days. Patient says that initially he noticed a small red area over his in her thigh on the left , which has been progressing and now includes his left pretibial area. He denies any injuries or any bug bites to the area. He never had an episode like this in the past. He denies any chest pain, denies any breathing difficulties, however endorses cough which has been intermittent for a while. His cough is nonproductive. He endorses subjective fever and chills at home as well getting worse along with his redness. He denies any abdominal pain, nausea vomiting or diarrhea. He denies any lightheadedness or dizziness. In the emergency room, patient has stable vital signs, his blood work was remarkable for  BUN of 62 and a creatinine of 3.3. His INR is 1.95. TRH was asked for admission for left lower extremity cellulitis.    Review of Systems: as per HPI otherwise 10 point ROS negative   Past Medical History  Diagnosis Date  . CAD (coronary artery disease)     myoview 04/21/11-normal, EF49%  . S/P CABG (coronary artery bypass graft) 1996  . Hyperlipemia   . Polycythemia     H/O  . GERD (gastroesophageal reflux disease)   . Tobacco abuse   . CKD (chronic kidney disease), stage III   . Myocardial infarction 1996  . OSA on CPAP     CPAP q night , CPAP is through [place], states doesn't remember when he had the last study  . Pneumonia     hosp. as a child with pneumonia,  not since   . Diabetes     BORDERLINE  . Headache     uses [employer], states he is lessening what he is taking for prep. for surgery   . Arthritis     in his back  . Hypertension     stress test- done 04/2014, followed by Dr. Gwenlyn Found  . Carotid stenosis 01/15/08    right endarterectomy-Dr. Amedeo Plenty  . PAD (peripheral artery disease) 06/12/09    a. 11/22/07 PTA & stenting right external iliac artery;bilateral iliac & PTI and stenting 1997;right ICA =>50% reduction,right SFA >50%,left ICA at stent => 50% reduction,left EIA => 50% reduction,left ATA occluded, left SFA mid > 49% reduction;  03/2014 Angio: LRA 90, patent L Iliac stent and distal RCIA stent, large saccular AAA.  [name] Abdominal aortic aneurysm 01/28/10; 02/05/14    4.3x4.6cm;  now measuring 5.5 cm, status post aortobifemoral bypass grafting in January 2016 by Dr. problem along with left renal artery bypass   Past Surgical History  Procedure Laterality Date  . Carotid endarterectomy Right 01/15/08  . Lumbar disc surgery  1998  . Pv angio  11/22/2007    PTA/ stenting of right common iliac artery with a 10x53mm Smart stent and postdilated with a 7x27mmpowerflex balloon; high grade calcified stenosis of the RICA  . Colonoscopy N/A 07/09/2013    Procedure: COLONOSCOPY;  Surgeon: Juanita Craver, MD;  Location: [facility];  Service: Endoscopy;  Laterality: N/A;  . Pv angio  04/01/14    AAA, Lt renal artery stenosis, patent iliacs  . Abdominal aortagram N/A 04/01/2014    Procedure: ABDOMINAL Maxcine Ham;  Surgeon: [name], MD;  Location: [facility] CATH LAB;  Service: Cardiovascular;  Laterality: N/A;  . Back surgery  1988    at [facility]  . Coronary artery bypass graft  1996    LIMA to LAD, sequential vein to OM1 and 2 as well as acure marginal branch and diag branch  . Aorta - bilateral femoral artery bypass graft N/A 05/30/2014    Procedure: AORTOBIFEMORAL BYPASS GRAFT;  Surgeon: Serafina Mitchell, MD;  Location: [place];  Service: Vascular;  Laterality:  N/A;  . Aortic/renal bypass Left 05/30/2014    Procedure: LEFT RENAL ARTERY BYPASS;  Surgeon: Serafina Mitchell, MD;  Location: [facility] OR;  Service: Vascular;  Laterality: Left;   Social History:  reports that he quit smoking about 4 months ago. His smoking use included Cigarettes. He has a 28 pack-year smoking history. He has never used smokeless tobacco. He reports that he does not drink alcohol or use illicit drugs.  Allergies  Allergen Reactions  . Niaspan [Niacin Er] Hives    Family History  Problem Relation Age of Onset  . Heart failure Father     Prior to Admission medications   Medication Sig Start Date End Date Taking? Authorizing Provider  amLODipine (NORVASC) 10 MG tablet Take 10 mg by mouth daily before breakfast.     Historical Provider, MD  Aspirin-Acetaminophen-Caffeine (GOODY HEADACHE PO) Take 1 packet by mouth every hour as needed (for pain).     Historical Provider, MD  atorvastatin (LIPITOR) 80 MG tablet Take 80 mg by mouth at bedtime.     Historical Provider, MD  carvedilol (COREG) 12.5 MG tablet Take 1 tablet (12.5 mg total) by mouth 2 (two) times daily with a meal. 08/07/14   [name], MD  Cholecalciferol (VITAMIN D3) 5000 UNITS CAPS Take 5,000 Units by mouth daily.    Historical Provider, MD  fenofibrate 160 MG tablet Take 160 mg by mouth at bedtime.     Historical Provider, MD  furosemide (LASIX) 40 MG tablet Take 40 mg by mouth 2 (two) times daily.    Historical Provider, MD  omeprazole (PRILOSEC) 20 MG capsule Take 20 mg by mouth at bedtime.     Historical Provider, MD  potassium chloride SA (K-DUR,KLOR-CON) 20 MEQ tablet Take 20 mEq by mouth daily.    Historical Provider, MD  traMADol (ULTRAM) 50 MG tablet Take 1-2 tablets (50-100 mg total) by mouth every 6 (six) hours as needed for moderate pain. 06/28/14   Ulyses Amor, PA-C  warfarin (COUMADIN) 2 MG tablet Take 1 tablet (2 mg total) by mouth daily. 07/24/14   [name], MD   Physical Exam: Filed  Vitals:   10/06/14 1703 10/06/14 1710 10/06/14 1730 10/06/14 1800  BP: 118/103 142/76 144/82 134/73  Pulse: 54 61 52 55  Temp:      TempSrc:      Resp: 23 21 13 22   Height:      Weight:      SpO2: 99% 98% 98% 95%     General:  No apparent distress,  Pleasant Caucasian gentleman  Eyes: PERRL, EOMI, no scleral icterus  ENT: moist oropharynx  Neck: supple, no lymphadenopathy  Cardiovascular: regular rate without MRG;  good peripheral pulses, no JVD, no peripheral edema  Respiratory: CTA biL, good air  movement without wheezing, rhonchi or crackled  Abdomen: soft, non tender to palpation, positive bowel sounds  Skin:  Mild erythema covering his left inner thigh, extending to his pretibial area , does not involve the foot, warm to touch , mild tenderness. Margins drawn  Musculoskeletal: normal bulk and tone, no joint swelling  Psychiatric: normal mood and affect  Neurologic: CN 2-12 grossly intact, MS 5/5 in all 4  Labs on Admission:  Basic Metabolic Panel:  Recent Labs Lab 10/06/14 1657  NA 137  K 4.1  CL 101  CO2 25  GLUCOSE 123*  BUN 62*  CREATININE 3.34*  CALCIUM 9.4   Liver Function Tests:  Recent Labs Lab 10/06/14 1657  AST 20  ALT 19  ALKPHOS 34*  BILITOT 0.5  PROT 7.5  ALBUMIN 3.6   CBC:  Recent Labs Lab 10/06/14 1657  WBC 8.2  NEUTROABS 5.2  HGB 14.4  HCT 42.9  MCV 87.6  PLT 254   CBG:  Recent Labs Lab 10/06/14 1550  GLUCAP 110*    Radiological Exams on Admission: No results found. CXR pending  Assessment/Plan Active Problems:   CAD (coronary artery disease)   Hypertensive heart disease   Hyperlipidemia   COPD GOLD II still smoking    Cigarette smoker   CKD (chronic kidney disease), stage III   PAD (peripheral artery disease)   Atrial fibrillation   Acute on chronic renal failure   Long-term (current) use of anticoagulants   Cellulitis of left lower extremity   Cellulitis   Left lower extremity cellulitis -  without clear port of entry, start IV vancomycin, margins were drawn and monitor for response. - Patient has good pulses, and per Dr. [name] his Ankle-brachial indices bilaterally were normal - check A1C  AKI on CKD stage III-IV -  In February 2016, patient's creatinine was 2.0 , he has been established with a nephrologist at the [facility] since, and his kidney function has been monitored regularly. His creatinine today is 3.34, which is worse than his known baseline, however I do not have and in between values since he started seeing his nephrologist -  He tells me he had a problem with fluid overload at the [facility], however he refused hospitalization, and he was placed on Lasix since that time. I will hold Lasix for tonight and repeat BMP in am - he has been using Goody's powder but reports no other NSAIDs  COPD  - no wheezing, ongoing tobacco abuse - resume home medications - with a cough in the ED, will obtain CXR  PAD - seeing vascular surgery regularly  A fib - rate controlled, continue Coumadin - check EKG  Tobacco abuse - counseled for cessation  - provide nicotine patch   HLD  - restart statin  CAD  - no chest pain, resume Coreg/Lipitor   Diet: heart healthy Fluids: none DVT Prophylaxis: on Coumadin  Code Status: Full  Family Communication: d/w wife bedside  Disposition Plan: admit to telemetry   Time spent: 48  Elison Worrel M. Cruzita Lederer, MD [facility] Pager [phone]  If 7PM-7AM, please contact night-coverage [email or web address] Password [facility] 10/06/2014, 6:25 PM

## 2014-10-06 NOTE — ED Notes (Signed)
PT reports swelling and redness to left lower leg x4 days with body aches and chills at times x2 days.

## 2014-10-06 NOTE — Progress Notes (Addendum)
ANTICOAGULATION CONSULT NOTE - Initial Consult  Pharmacy Consult for Coumadin Indication: atrial fibrillation  Allergies  Allergen Reactions  . Niaspan [Niacin Er] Hives    Patient Measurements: Height: 5\' 7"  (170.2 cm) Weight: 179 lb (81.194 kg) IBW/kg (Calculated) : 66.1 Heparin Dosing Weight:   Vital Signs: Temp: 97.8 F (36.6 C) (05/15 1543) Temp Source: Oral (05/15 1543) BP: 134/73 mmHg (05/15 1800) Pulse Rate: 55 (05/15 1800)  Labs:  Recent Labs  10/06/14 1657  HGB 14.4  HCT 42.9  PLT 254  LABPROT 22.4*  INR 1.95*  CREATININE 3.34*    Estimated Creatinine Clearance: 21.6 mL/min (by C-G formula based on Cr of 3.34).   Medical History: Past Medical History  Diagnosis Date  . CAD (coronary artery disease)     myoview 04/21/11-normal, EF49%  . S/P CABG (coronary artery bypass graft) 1996  . Hyperlipemia   . Polycythemia     H/O  . GERD (gastroesophageal reflux disease)   . Tobacco abuse   . CKD (chronic kidney disease), stage III   . Myocardial infarction 1996  . OSA on CPAP     CPAP q night , CPAP is through [place], states doesn't remember when he had the last study  . Pneumonia     hosp. as a child with pneumonia, not since   . Diabetes     BORDERLINE  . Headache     uses [employer], states he is lessening what he is taking for prep. for surgery   . Arthritis     in his back  . Hypertension     stress test- done 04/2014, followed by Dr. Gwenlyn Found  . Carotid stenosis 01/15/08    right endarterectomy-Dr. Amedeo Plenty  . PAD (peripheral artery disease) 06/12/09    a. 11/22/07 PTA & stenting right external iliac artery;bilateral iliac & PTI and stenting 1997;right ICA =>50% reduction,right SFA >50%,left ICA at stent => 50% reduction,left EIA => 50% reduction,left ATA occluded, left SFA mid > 49% reduction;  03/2014 Angio: LRA 90, patent L Iliac stent and distal RCIA stent, large saccular AAA.  [name] Abdominal aortic aneurysm 01/28/10; 02/05/14    4.3x4.6cm;  now measuring  5.5 cm, status post aortobifemoral bypass grafting in January 2016 by Dr. problem along with left renal artery bypass    Medications:  Prescriptions prior to admission  Medication Sig Dispense Refill Last Dose  . amLODipine (NORVASC) 10 MG tablet Take 10 mg by mouth daily before breakfast.    Taking  . Aspirin-Acetaminophen-Caffeine (GOODY HEADACHE PO) Take 1 packet by mouth every hour as needed (for pain).    Taking  . atorvastatin (LIPITOR) 80 MG tablet Take 80 mg by mouth at bedtime.    Taking  . carvedilol (COREG) 12.5 MG tablet Take 1 tablet (12.5 mg total) by mouth 2 (two) times daily with a meal. 60 tablet 11 Taking  . Cholecalciferol (VITAMIN D3) 5000 UNITS CAPS Take 5,000 Units by mouth daily.   Taking  . fenofibrate 160 MG tablet Take 160 mg by mouth at bedtime.    Taking  . furosemide (LASIX) 40 MG tablet Take 40 mg by mouth 2 (two) times daily.     [name] omeprazole (PRILOSEC) 20 MG capsule Take 20 mg by mouth at bedtime.    Taking  . potassium chloride SA (K-DUR,KLOR-CON) 20 MEQ tablet Take 20 mEq by mouth daily.     . traMADol (ULTRAM) 50 MG tablet Take 1-2 tablets (50-100 mg total) by mouth every 6 (six) hours  as needed for moderate pain. 40 tablet 0 Taking  . warfarin (COUMADIN) 2 MG tablet Take 1 tablet (2 mg total) by mouth daily. 30 tablet 3 Taking    Assessment: Continuation PTA Coumadin for AFIB INR slightly sub therapeutic on admission Labs reviewed PTA medications reviewed, per RN patient takes 1 mg on Sunday, 2 mg all other days  Goal of Therapy:  INR 2-3 Monitor platelets by anticoagulation protocol: Yes   Plan:  Coumadin 1 mg po x1 dose today (home regiment) INR/PT daily Abner Greenspan, Sukhdeep Wieting Bennett 10/06/2014,6:58 PM

## 2014-10-06 NOTE — Progress Notes (Signed)
ANTIBIOTIC CONSULT NOTE - INITIAL  Pharmacy Consult for Vancomycin Indication: cellulitis  Allergies  Allergen Reactions  . Niaspan [Niacin Er] Hives    Patient Measurements: Height: 5\' 7"  (170.2 cm) Weight: 179 lb (81.194 kg) IBW/kg (Calculated) : 66.1 Adjusted Body Weight:   Vital Signs:  Intake/Output from previous day:   Intake/Output from this shift:    Labs:  Recent Labs  10/06/14 1657  WBC 8.2  HGB 14.4  PLT 254  CREATININE 3.34*   Estimated Creatinine Clearance: 21.6 mL/min (by C-G formula based on Cr of 3.34). No results for input(s): VANCOTROUGH, VANCOPEAK, VANCORANDOM, GENTTROUGH, GENTPEAK, GENTRANDOM, TOBRATROUGH, TOBRAPEAK, TOBRARND, AMIKACINPEAK, AMIKACINTROU, AMIKACIN in the last 72 hours.   Microbiology: No results found for this or any previous visit (from the past 720 hour(s)).  Medical History: Past Medical History  Diagnosis Date  . CAD (coronary artery disease)     myoview 04/21/11-normal, EF49%  . S/P CABG (coronary artery bypass graft) 1996  . Hyperlipemia   . Polycythemia     H/O  . GERD (gastroesophageal reflux disease)   . Tobacco abuse   . CKD (chronic kidney disease), stage III   . Myocardial infarction 1996  . OSA on CPAP     CPAP q night , CPAP is through [place], states doesn't remember when he had the last study  . Pneumonia     hosp. as a child with pneumonia, not since   . Diabetes     BORDERLINE  . Headache     uses [employer], states he is lessening what he is taking for prep. for surgery   . Arthritis     in his back  . Hypertension     stress test- done 04/2014, followed by Dr. Gwenlyn Found  . Carotid stenosis 01/15/08    right endarterectomy-Dr. Amedeo Plenty  . PAD (peripheral artery disease) 06/12/09    a. 11/22/07 PTA & stenting right external iliac artery;bilateral iliac & PTI and stenting 1997;right ICA =>50% reduction,right SFA >50%,left ICA at stent => 50% reduction,left EIA => 50% reduction,left ATA occluded, left SFA mid >  49% reduction;  03/2014 Angio: LRA 90, patent L Iliac stent and distal RCIA stent, large saccular AAA.  [name] Abdominal aortic aneurysm 01/28/10; 02/05/14    4.3x4.6cm;  now measuring 5.5 cm, status post aortobifemoral bypass grafting in January 2016 by Dr. problem along with left renal artery bypass    Medications:   (Not in a hospital admission) Assessment: Swelling, redness left lower leg for past 4 days, body aches and chills Reduced renal function, CrCl 21.6 ml/min  Goal of Therapy:  Vancomycin trough level 10-15 mcg/ml  Plan:  Vancomycin 1 GM IV every 24 hours Vancomycin trough at steady state Monitor renal function Labs per protocol  Abner Greenspan, Teagon Kron Bennett 10/06/2014,5:40 PM

## 2014-10-06 NOTE — ED Provider Notes (Addendum)
CSN: [id number]     Arrival date & time 10/06/14  1522 History   First MD Initiated Contact with Patient 10/06/14 1700     Chief Complaint  Patient presents with  . Cellulitis     (Consider location/radiation/quality/duration/timing/severity/associated sxs/prior Treatment) HPI...... patient noticed erythema, swelling in left lower extremity for 2 days with associated fever and chills. Has multiple health problems well documented in the past medical history including history of CABG and aortobifem grafting.  No known trauma or irritation to left lower extremity. Severity is moderate. Palpation makes pain worse.  Past Medical History  Diagnosis Date  . CAD (coronary artery disease)     myoview 04/21/11-normal, EF49%  . S/P CABG (coronary artery bypass graft) 1996  . Hyperlipemia   . Polycythemia     H/O  . GERD (gastroesophageal reflux disease)   . Tobacco abuse   . CKD (chronic kidney disease), stage III   . Myocardial infarction 1996  . OSA on CPAP     CPAP q night , CPAP is through [place], states doesn't remember when he had the last study  . Pneumonia     hosp. as a child with pneumonia, not since   . Diabetes     BORDERLINE  . Headache     uses [employer], states he is lessening what he is taking for prep. for surgery   . Arthritis     in his back  . Hypertension     stress test- done 04/2014, followed by Dr. Gwenlyn Found  . Carotid stenosis 01/15/08    right endarterectomy-Dr. Amedeo Plenty  . PAD (peripheral artery disease) 06/12/09    a. 11/22/07 PTA & stenting right external iliac artery;bilateral iliac & PTI and stenting 1997;right ICA =>50% reduction,right SFA >50%,left ICA at stent => 50% reduction,left EIA => 50% reduction,left ATA occluded, left SFA mid > 49% reduction;  03/2014 Angio: LRA 90, patent L Iliac stent and distal RCIA stent, large saccular AAA.  [name] Abdominal aortic aneurysm 01/28/10; 02/05/14    4.3x4.6cm;  now measuring 5.5 cm, status post aortobifemoral bypass grafting in  January 2016 by Dr. problem along with left renal artery bypass   Past Surgical History  Procedure Laterality Date  . Carotid endarterectomy Right 01/15/08  . Lumbar disc surgery  1998  . Pv angio  11/22/2007    PTA/ stenting of right common iliac artery with a 10x39mm Smart stent and postdilated with a 7x61mmpowerflex balloon; high grade calcified stenosis of the RICA  . Colonoscopy N/A 07/09/2013    Procedure: COLONOSCOPY;  Surgeon: Juanita Craver, MD;  Location: [facility];  Service: Endoscopy;  Laterality: N/A;  . Pv angio  04/01/14    AAA, Lt renal artery stenosis, patent iliacs  . Abdominal aortagram N/A 04/01/2014    Procedure: ABDOMINAL Maxcine Ham;  Surgeon: [name], MD;  Location: [facility] CATH LAB;  Service: Cardiovascular;  Laterality: N/A;  . Back surgery  1988    at [facility]  . Coronary artery bypass graft  1996    LIMA to LAD, sequential vein to OM1 and 2 as well as acure marginal branch and diag branch  . Aorta - bilateral femoral artery bypass graft N/A 05/30/2014    Procedure: AORTOBIFEMORAL BYPASS GRAFT;  Surgeon: Serafina Mitchell, MD;  Location: [place];  Service: Vascular;  Laterality: N/A;  . Aortic/renal bypass Left 05/30/2014    Procedure: LEFT RENAL ARTERY BYPASS;  Surgeon: Serafina Mitchell, MD;  Location: [place];  Service: Vascular;  Laterality:  Left;   Family History  Problem Relation Age of Onset  . Heart failure Father    History  Substance Use Topics  . Smoking status: Former Smoker -- 0.50 packs/day for 56 years    Types: Cigarettes    Quit date: 05/30/2014  . Smokeless tobacco: Never Used  . Alcohol Use: No    Review of Systems  All other systems reviewed and are negative.     Allergies  Niaspan  Home Medications   Prior to Admission medications   Medication Sig Start Date End Date Taking? Authorizing Provider  amLODipine (NORVASC) 10 MG tablet Take 10 mg by mouth daily before breakfast.     Historical Provider, MD  Aspirin-Acetaminophen-Caffeine (GOODY  HEADACHE PO) Take 1 packet by mouth every hour as needed (for pain).     Historical Provider, MD  atorvastatin (LIPITOR) 80 MG tablet Take 80 mg by mouth at bedtime.     Historical Provider, MD  carvedilol (COREG) 12.5 MG tablet Take 1 tablet (12.5 mg total) by mouth 2 (two) times daily with a meal. 08/07/14   [name], MD  Cholecalciferol (VITAMIN D3) 5000 UNITS CAPS Take 5,000 Units by mouth daily.    Historical Provider, MD  fenofibrate 160 MG tablet Take 160 mg by mouth at bedtime.     Historical Provider, MD  furosemide (LASIX) 40 MG tablet Take 40 mg by mouth 2 (two) times daily.    Historical Provider, MD  omeprazole (PRILOSEC) 20 MG capsule Take 20 mg by mouth at bedtime.     Historical Provider, MD  potassium chloride SA (K-DUR,KLOR-CON) 20 MEQ tablet Take 20 mEq by mouth daily.    Historical Provider, MD  traMADol (ULTRAM) 50 MG tablet Take 1-2 tablets (50-100 mg total) by mouth every 6 (six) hours as needed for moderate pain. 06/28/14   Ulyses Amor, PA-C  warfarin (COUMADIN) 2 MG tablet Take 1 tablet (2 mg total) by mouth daily. 07/24/14   [name], MD   BP 142/76 mmHg  Pulse 61  Temp(Src) 97.8 F (36.6 C) (Oral)  Resp 21  Ht 5\' 7"  (1.702 m)  Wt 179 lb (81.194 kg)  BMI 28.03 kg/m2  SpO2 98% Physical Exam  Constitutional: He is oriented to person, place, and time. He appears well-developed and well-nourished.  HENT:  Head: Normocephalic and atraumatic.  Eyes: Conjunctivae and EOM are normal. Pupils are equal, round, and reactive to light.  Neck: Normal range of motion. Neck supple.  Cardiovascular: Normal rate and regular rhythm.   Pulmonary/Chest: Effort normal and breath sounds normal.  Abdominal: Soft. Bowel sounds are normal.  Musculoskeletal: Normal range of motion.  Neurological: He is alert and oriented to person, place, and time.  Skin:  Left lower extremity: Erythema most pronounced on medial aspect of thigh and in mid to distal tibia area.   Psychiatric: He has a normal mood and affect. His behavior is normal.  Nursing note and vitals reviewed.   ED Course  Procedures (including critical care time) Labs Review Labs Reviewed  CBC WITH DIFFERENTIAL/PLATELET - Abnormal; Notable for the following:    RDW 16.5 (*)    All other components within normal limits  COMPREHENSIVE METABOLIC PANEL - Abnormal; Notable for the following:    Glucose, Bld 123 (*)    BUN 62 (*)    Creatinine, Ser 3.34 (*)    Alkaline Phosphatase 34 (*)    GFR calc non Af Amer 18 (*)    GFR calc  Af Amer 20 (*)    All other components within normal limits  PROTIME-INR - Abnormal; Notable for the following:    Prothrombin Time 22.4 (*)    INR 1.95 (*)    All other components within normal limits  CBG MONITORING, ED - Abnormal; Notable for the following:    Glucose-Capillary 110 (*)    All other components within normal limits    Imaging Review No results found.   EKG Interpretation None      MDM   Final diagnoses:  Cellulitis of left lower extremity    Patient is stable. IV vancomycin. Admit.    Nat Christen, MD 10/06/14 [place], MD 10/06/14 [phone]

## 2014-10-06 NOTE — ED Notes (Signed)
CBG 110. 

## 2014-10-07 ENCOUNTER — Other Ambulatory Visit: Payer: BC Managed Care – PPO

## 2014-10-07 ENCOUNTER — Ambulatory Visit: Payer: BC Managed Care – PPO | Admitting: Surgery

## 2014-10-07 DIAGNOSIS — N183 Chronic kidney disease, stage 3 (moderate): Secondary | ICD-10-CM

## 2014-10-07 DIAGNOSIS — J449 Chronic obstructive pulmonary disease, unspecified: Secondary | ICD-10-CM

## 2014-10-07 DIAGNOSIS — E785 Hyperlipidemia, unspecified: Secondary | ICD-10-CM

## 2014-10-07 LAB — BASIC METABOLIC PANEL
Anion gap: 11 (ref 5–15)
BUN: 61 mg/dL — ABNORMAL HIGH (ref 6–20)
CO2: 24 mmol/L (ref 22–32)
Calcium: 9.3 mg/dL (ref 8.9–10.3)
Chloride: 101 mmol/L (ref 101–111)
Creatinine, Ser: 3.21 mg/dL — ABNORMAL HIGH (ref 0.61–1.24)
GFR calc Af Amer: 21 mL/min — ABNORMAL LOW (ref >60)
GFR calc non Af Amer: 18 mL/min — ABNORMAL LOW (ref >60)
Glucose, Bld: 92 mg/dL (ref 65–99)
Potassium: 3.9 mmol/L (ref 3.5–5.1)
Sodium: 136 mmol/L (ref 135–145)

## 2014-10-07 LAB — CBC
HCT: 41.5 % (ref 39.0–52.0)
Hemoglobin: 13.6 g/dL (ref 13.0–17.0)
MCH: 28.8 pg (ref 26.0–34.0)
MCHC: 32.8 g/dL (ref 30.0–36.0)
MCV: 87.9 fL (ref 78.0–100.0)
Platelets: 260 10*3/uL (ref 150–400)
RBC: 4.72 MIL/uL (ref 4.22–5.81)
RDW: 16.2 % — ABNORMAL HIGH (ref 11.5–15.5)
WBC: 6.3 10*3/uL (ref 4.0–10.5)

## 2014-10-07 LAB — GLUCOSE, CAPILLARY
Glucose-Capillary: 89 mg/dL (ref 65–99)
Glucose-Capillary: 99 mg/dL (ref 65–99)
Glucose-Capillary: 109 mg/dL — ABNORMAL HIGH (ref 65–99)

## 2014-10-07 LAB — PROTIME-INR
INR: 1.97 — ABNORMAL HIGH (ref 0.00–1.49)
Prothrombin Time: 22.6 seconds — ABNORMAL HIGH (ref 11.6–15.2)

## 2014-10-07 LAB — LIPID PANEL
Cholesterol: 97 mg/dL (ref 0–200)
HDL: 22 mg/dL — ABNORMAL LOW (ref >40)
LDL Cholesterol: 49 mg/dL (ref 0–99)
Total CHOL/HDL Ratio: 4.4 RATIO
Triglycerides: 130 mg/dL (ref <150)
VLDL: 26 mg/dL (ref 0–40)

## 2014-10-07 MED ORDER — HYDROCORTISONE 1 % EX CREA
TOPICAL_CREAM | Freq: Three times a day (TID) | CUTANEOUS | Status: DC
  Administered 2014-10-07: 1 via TOPICAL
  Filled 2014-10-07: qty 1.5

## 2014-10-07 MED ORDER — WARFARIN SODIUM 2 MG PO TABS
2.0000 mg | ORAL_TABLET | Freq: Once | ORAL | Status: AC
  Administered 2014-10-07: 2 mg via ORAL
  Filled 2014-10-07: qty 1

## 2014-10-07 MED ORDER — WARFARIN - PHARMACIST DOSING INPATIENT: Status: DC

## 2014-10-07 MED ORDER — CEFAZOLIN SODIUM 1-5 GM-% IV SOLN
1.0000 g | Freq: Two times a day (BID) | INTRAVENOUS | Status: DC
  Administered 2014-10-07 (×2): 1 g via INTRAVENOUS
  Filled 2014-10-07 (×5): qty 50

## 2014-10-07 MED ORDER — CARVEDILOL 12.5 MG PO TABS
12.5000 mg | ORAL_TABLET | Freq: Two times a day (BID) | ORAL | Status: DC
  Administered 2014-10-07: 12.5 mg via ORAL
  Filled 2014-10-07 (×2): qty 1

## 2014-10-07 NOTE — Progress Notes (Signed)
[facility] PROGRESS NOTE  Barry Lawrence [id number] DOB: 08/17/1945 DOA: 10/06/2014 PCP: [name], MD  Assessment/Plan: Left lower extremity cellulitis - etiology unclear. initally vancomycin started but in light of ARF will change to ancef per Dr Wyline Copas. Erythema appears to be improving to LEE>  Patient has good pulses, and per Dr. [name] his Ankle-brachial indices bilaterally were normal. A1c pending.   AKI on CKD stage III-IV - In February 2016, patient's creatinine was 2.0 and he believes his baseline is 3.0. Currently trending down somewhat. He is established with a nephrologist at the [facility].continue to hold Lasix.  he has been using Goody's powder but reports no other NSAIDs. Continue gently IV fluid. Urine output good.  Recheck in am.   COPD  - no wheezing, ongoing tobacco abuse: cessation counseling offered.  - resume home medications - CXR Vascular congestion and borderline cardiomegaly. Mild bibasilar airspace opacities likely reflect atelectasis  PAD - stable at baseline  A fib - rate controlled, continue Coumadin INR 1.97.  Tobacco abuse - counseled for cessation  - provide nicotine patch   HLD  - continue statin. Await lipid panel  CAD  - no chest pain, resume Coreg/Lipitor   Code Status: full Family Communication: none present Disposition Plan: home hopefully 24-48 hours   Consultants:  none  Procedures:  none  Antibiotics:  Vancomycin 10/06/14-10/07/14  Ancef 10/07/14>>  HPI/Subjective: Sitting on side of bed eating. Denies pain discomfort  Objective: Filed Vitals:   10/07/14 0555  BP: 137/83  Pulse: 66  Temp: 98.1 F (36.7 C)  Resp: 20    Intake/Output Summary (Last 24 hours) at 10/07/14 1413 Last data filed at 10/07/14 0630  Gross per 24 hour  Intake    680 ml  Output    850 ml  Net   -170 ml   Filed Weights   10/06/14 1543 10/06/14 1906 10/07/14 0555  Weight: 81.194 kg (179 lb) 79.833 kg (176 lb) 79.425  kg (175 lb 1.6 oz)    Exam:   General:  Well nourished somewhat irritable  Cardiovascular: RRR no m/g/r no LE edema  Respiratory: normal effort BS slightly distant but clear no wheeze  Abdomen: non-distended soft +BS non-tender to palpation  Musculoskeletal: left leg with mild erythema to medal aspect thigh dow to pretibial area. Some retraction from marks made on admission. No swelling no heat no tenderness.    Data Reviewed: Basic Metabolic Panel:  Recent Labs Lab 10/06/14 1657 10/07/14 0601  NA 137 136  K 4.1 3.9  CL 101 101  CO2 25 24  GLUCOSE 123* 92  BUN 62* 61*  CREATININE 3.34* 3.21*  CALCIUM 9.4 9.3   Liver Function Tests:  Recent Labs Lab 10/06/14 1657  AST 20  ALT 19  ALKPHOS 34*  BILITOT 0.5  PROT 7.5  ALBUMIN 3.6   No results for input(s): LIPASE, AMYLASE in the last 168 hours. No results for input(s): AMMONIA in the last 168 hours. CBC:  Recent Labs Lab 10/06/14 1657 10/07/14 0601  WBC 8.2 6.3  NEUTROABS 5.2  --   HGB 14.4 13.6  HCT 42.9 41.5  MCV 87.6 87.9  PLT 254 260   Cardiac Enzymes: No results for input(s): CKTOTAL, CKMB, CKMBINDEX, TROPONINI in the last 168 hours. BNP (last 3 results) No results for input(s): BNP in the last 8760 hours.  ProBNP (last 3 results) No results for input(s): PROBNP in the last 8760 hours.  CBG:  Recent Labs Lab 10/06/14 1550 10/06/14  2022 10/07/14 0803 10/07/14 1151  GLUCAP 110* 144* 89 99    No results found for this or any previous visit (from the past 240 hour(s)).   Studies: Dg Chest 2 View  10/06/2014   CLINICAL DATA:  Acute onset of chronic nonproductive cough. Cellulitis. Initial encounter.  EXAM: CHEST  2 VIEW  COMPARISON:  Chest radiograph performed 06/19/2014, and CT of the chest performed 09/04/2014  FINDINGS: The lungs are well-aerated. Vascular congestion is noted. Mild bibasilar opacities likely reflect atelectasis. There is no evidence of pleural effusion or pneumothorax.   The heart is borderline enlarged. The patient is status post median sternotomy, with evidence of prior CABG. No acute osseous abnormalities are seen.  IMPRESSION: Vascular congestion and borderline cardiomegaly. Mild bibasilar airspace opacities likely reflect atelectasis.   Electronically Signed   By: Garald Balding M.D.   On: 10/06/2014 23:22    Scheduled Meds: . amLODipine  10 mg Oral QAC breakfast  . atorvastatin  80 mg Oral QHS  . carvedilol  12.5 mg Oral BID WC  .  ceFAZolin (ANCEF) IV  1 g Intravenous Q12H  . fenofibrate  160 mg Oral QHS  . furosemide  40 mg Oral BID  . hydrocortisone cream   Topical TID  . nicotine  14 mg Transdermal Daily  . pantoprazole  40 mg Oral Daily  . sodium chloride  3 mL Intravenous Q12H  . warfarin  2 mg Oral Once  . [START ON 10/08/2014] Warfarin - Pharmacist Dosing Inpatient   Does not apply Q24H   Continuous Infusions:   Active Problems:   CAD (coronary artery disease)   Hypertensive heart disease   Hyperlipidemia   COPD GOLD II still smoking    Cigarette smoker   CKD (chronic kidney disease), stage III   PAD (peripheral artery disease)   Atrial fibrillation   Acute on chronic renal failure   Long-term (current) use of anticoagulants   Cellulitis of left lower extremity   Cellulitis    Time spent: 30 minutes    [facility] Pager [phone] If 7PM-7AM, please contact night-coverage at [email or web address], password [facility] 10/07/2014, 2:13 PM  LOS: 1 day

## 2014-10-07 NOTE — Progress Notes (Signed)
ANTIBIOTIC CONSULT NOTE  Pharmacy Consult for Cefazolin Indication: cellulitis  Allergies  Allergen Reactions  . Niaspan [Niacin Er] Hives   Patient Measurements: Height: 5\' 7"  (170.2 cm) Weight: 175 lb 1.6 oz (79.425 kg) IBW/kg (Calculated) : 66.1  Vital Signs:  Intake/Output from previous day: 05/15 0701 - 05/16 0700 In: 680 [P.O.:480; IV Piggyback:200] Out: 850 [Urine:850] Intake/Output from this shift:    Labs:  Recent Labs  10/06/14 1657 10/07/14 0601  WBC 8.2 6.3  HGB 14.4 13.6  PLT 254 260  CREATININE 3.34* 3.21*   Estimated Creatinine Clearance: 22.2 mL/min (by C-G formula based on Cr of 3.21). No results for input(s): VANCOTROUGH, VANCOPEAK, VANCORANDOM, GENTTROUGH, GENTPEAK, GENTRANDOM, TOBRATROUGH, TOBRAPEAK, TOBRARND, AMIKACINPEAK, AMIKACINTROU, AMIKACIN in the last 72 hours.   Microbiology: No results found for this or any previous visit (from the past 720 hour(s)).  Medical History: Past Medical History  Diagnosis Date  . CAD (coronary artery disease)     myoview 04/21/11-normal, EF49%  . S/P CABG (coronary artery bypass graft) 1996  . Hyperlipemia   . Polycythemia     H/O  . GERD (gastroesophageal reflux disease)   . Tobacco abuse   . CKD (chronic kidney disease), stage III   . Myocardial infarction 1996  . OSA on CPAP     CPAP q night , CPAP is through [place], states doesn't remember when he had the last study  . Pneumonia     hosp. as a child with pneumonia, not since   . Diabetes     BORDERLINE  . Headache     uses [employer], states he is lessening what he is taking for prep. for surgery   . Arthritis     in his back  . Hypertension     stress test- done 04/2014, followed by Dr. Gwenlyn Found  . Carotid stenosis 01/15/08    right endarterectomy-Dr. Amedeo Plenty  . PAD (peripheral artery disease) 06/12/09    a. 11/22/07 PTA & stenting right external iliac artery;bilateral iliac & PTI and stenting 1997;right ICA =>50% reduction,right SFA >50%,left ICA  at stent => 50% reduction,left EIA => 50% reduction,left ATA occluded, left SFA mid > 49% reduction;  03/2014 Angio: LRA 90, patent L Iliac stent and distal RCIA stent, large saccular AAA.  [name] Abdominal aortic aneurysm 01/28/10; 02/05/14    4.3x4.6cm;  now measuring 5.5 cm, status post aortobifemoral bypass grafting in January 2016 by Dr. problem along with left renal artery bypass   Anti-infectives    Start     Dose/Rate Route Frequency Ordered Stop   10/07/14 1200  ceFAZolin (ANCEF) IVPB 1 g/50 mL premix     1 g 100 mL/hr over 30 Minutes Intravenous Every 12 hours 10/07/14 1137     10/06/14 1800  vancomycin (VANCOCIN) IVPB 1000 mg/200 mL premix  Status:  Discontinued     1,000 mg 200 mL/hr over 60 Minutes Intravenous Every 24 hours 10/06/14 1740 10/07/14 1136      Assessment: Swelling, redness left lower leg for past 4 days, body aches and chills Poor renal function.  Vancomycin to be switched to cefazolin.  Goal of Therapy:  Eradicate infection.   Plan:  Cefazolin 1gm IV every 12 hours. Monitor renal function Labs per protocol  Pricilla Larsson 10/07/2014,12:11 PM

## 2014-10-07 NOTE — Care Management Note (Signed)
Case Management Note  Patient Details  Name: Barry Lawrence MRN: [id number] Date of Birth: [birth year]  Subjective/Objective:                  Pt admitted from home with cellulitis. Pt lives with his wife and will return home at discharge. Pt is independent with ADL's. Pt is connected with [facility].  Action/Plan: Will notify [facility] of pts admission. Pt does not want to transfer at this time.  Expected Discharge Date:  10/10/14               Expected Discharge Plan:  Home/Self Care  In-House Referral:  NA  Discharge planning Services  CM Consult  Post Acute Care Choice:  NA Choice offered to:  NA  DME Arranged:    DME Agency:     HH Arranged:    HH Agency:     Status of Service:  In process, will continue to follow  Medicare Important Message Given:    Date Medicare IM Given:    Medicare IM give by:    Date Additional Medicare IM Given:    Additional Medicare Important Message give by:     If discussed at [place] of Stay Meetings, dates discussed:    Additional Comments:  Joylene Draft, RN 10/07/2014, 12:15 PM

## 2014-10-07 NOTE — Progress Notes (Signed)
[place] for Coumadin Indication: atrial fibrillation  Allergies  Allergen Reactions  . Niaspan [Niacin Er] Hives   Patient Measurements: Height: 5\' 7"  (170.2 cm) Weight: 175 lb 1.6 oz (79.425 kg) IBW/kg (Calculated) : 66.1  Vital Signs: Temp: 98.1 F (36.7 C) (05/16 0555) Temp Source: Oral (05/16 0555) BP: 137/83 mmHg (05/16 0555) Pulse Rate: 66 (05/16 0555)  Labs:  Recent Labs  10/06/14 1657 10/07/14 0601  HGB 14.4 13.6  HCT 42.9 41.5  PLT 254 260  LABPROT 22.4* 22.6*  INR 1.95* 1.97*  CREATININE 3.34* 3.21*   Estimated Creatinine Clearance: 22.2 mL/min (by C-G formula based on Cr of 3.21).  Medical History: Past Medical History  Diagnosis Date  . CAD (coronary artery disease)     myoview 04/21/11-normal, EF49%  . S/P CABG (coronary artery bypass graft) 1996  . Hyperlipemia   . Polycythemia     H/O  . GERD (gastroesophageal reflux disease)   . Tobacco abuse   . CKD (chronic kidney disease), stage III   . Myocardial infarction 1996  . OSA on CPAP     CPAP q night , CPAP is through [place], states doesn't remember when he had the last study  . Pneumonia     hosp. as a child with pneumonia, not since   . Diabetes     BORDERLINE  . Headache     uses [employer], states he is lessening what he is taking for prep. for surgery   . Arthritis     in his back  . Hypertension     stress test- done 04/2014, followed by Dr. Gwenlyn Found  . Carotid stenosis 01/15/08    right endarterectomy-Dr. Amedeo Plenty  . PAD (peripheral artery disease) 06/12/09    a. 11/22/07 PTA & stenting right external iliac artery;bilateral iliac & PTI and stenting 1997;right ICA =>50% reduction,right SFA >50%,left ICA at stent => 50% reduction,left EIA => 50% reduction,left ATA occluded, left SFA mid > 49% reduction;  03/2014 Angio: LRA 90, patent L Iliac stent and distal RCIA stent, large saccular AAA.  [name] Abdominal aortic aneurysm 01/28/10; 02/05/14    4.3x4.6cm;  now  measuring 5.5 cm, status post aortobifemoral bypass grafting in January 2016 by Dr. problem along with left renal artery bypass   Medications:  Prescriptions prior to admission  Medication Sig Dispense Refill Last Dose  . amLODipine (NORVASC) 10 MG tablet Take 10 mg by mouth daily before breakfast.    10/06/2014 at Unknown time  . Aspirin-Acetaminophen-Caffeine (GOODY HEADACHE PO) Take 1 packet by mouth every hour as needed (for pain).    Past Month at Unknown time  . atorvastatin (LIPITOR) 80 MG tablet Take 80 mg by mouth at bedtime.    10/05/2014 at Unknown time  . carvedilol (COREG) 12.5 MG tablet Take 1 tablet (12.5 mg total) by mouth 2 (two) times daily with a meal. 60 tablet 11 10/06/2014 at [place]  . Cholecalciferol (VITAMIN D3) 5000 UNITS CAPS Take 5,000 Units by mouth daily.   10/06/2014 at Unknown time  . fenofibrate 160 MG tablet Take 160 mg by mouth at bedtime.    10/05/2014 at Unknown time  . furosemide (LASIX) 40 MG tablet Take 40 mg by mouth 2 (two) times daily.   10/06/2014 at Unknown time  . omeprazole (PRILOSEC) 20 MG capsule Take 20 mg by mouth at bedtime.    10/05/2014 at Unknown time  . potassium chloride SA (K-DUR,KLOR-CON) 20 MEQ tablet Take 20 mEq by mouth  daily.   10/06/2014 at Unknown time  . warfarin (COUMADIN) 2 MG tablet Take 1 tablet (2 mg total) by mouth daily. (Patient taking differently: Take 1-2 mg by mouth daily. 2mg  on all days except on Sunday, take 1/2 tablet) 30 tablet 3 10/05/2014 at 2200   Assessment: Continuation PTA Coumadin for AFIB INR slightly sub therapeutic. Labs reviewed. PTA medications reviewed, per RN patient takes 1 mg on Sunday, 2 mg all other days  Goal of Therapy:  INR 2-3   Plan:  Coumadin 2 mg po x1 dose today. INR/PT daily Pricilla Larsson 10/07/2014,12:19 PM

## 2014-10-07 NOTE — Progress Notes (Signed)
UR chart review completed.  

## 2014-10-08 DIAGNOSIS — I480 Paroxysmal atrial fibrillation: Secondary | ICD-10-CM

## 2014-10-08 LAB — BASIC METABOLIC PANEL
Anion gap: 11 (ref 5–15)
BUN: 60 mg/dL — ABNORMAL HIGH (ref 6–20)
CO2: 24 mmol/L (ref 22–32)
Calcium: 9 mg/dL (ref 8.9–10.3)
Chloride: 101 mmol/L (ref 101–111)
Creatinine, Ser: 2.99 mg/dL — ABNORMAL HIGH (ref 0.61–1.24)
GFR calc Af Amer: 23 mL/min — ABNORMAL LOW (ref >60)
GFR calc non Af Amer: 20 mL/min — ABNORMAL LOW (ref >60)
Glucose, Bld: 84 mg/dL (ref 65–99)
Potassium: 3.6 mmol/L (ref 3.5–5.1)
Sodium: 136 mmol/L (ref 135–145)

## 2014-10-08 LAB — GLUCOSE, CAPILLARY: Glucose-Capillary: 85 mg/dL (ref 65–99)

## 2014-10-08 LAB — HEMOGLOBIN A1C
Hgb A1c MFr Bld: 5.4 % (ref 4.8–5.6)
Mean Plasma Glucose: 108 mg/dL

## 2014-10-08 LAB — PROTIME-INR
INR: 1.85 — ABNORMAL HIGH (ref 0.00–1.49)
Prothrombin Time: 21.5 seconds — ABNORMAL HIGH (ref 11.6–15.2)

## 2014-10-08 MED ORDER — POTASSIUM CHLORIDE CRYS ER 20 MEQ PO TBCR: EXTENDED_RELEASE_TABLET | ORAL | Status: DC

## 2014-10-08 MED ORDER — DOXYCYCLINE HYCLATE 100 MG PO CAPS: 100.0000 mg | ORAL_CAPSULE | Freq: Two times a day (BID) | ORAL | Status: DC

## 2014-10-08 MED ORDER — MINOCYCLINE HCL 100 MG PO CAPS: 100.0000 mg | ORAL_CAPSULE | Freq: Two times a day (BID) | ORAL | Status: DC

## 2014-10-08 MED ORDER — WARFARIN SODIUM 2 MG PO TABS: 3.0000 mg | ORAL_TABLET | Freq: Once | ORAL | Status: DC

## 2014-10-08 MED ORDER — FUROSEMIDE 40 MG PO TABS: ORAL_TABLET | ORAL | Status: DC

## 2014-10-08 NOTE — Care Management Note (Signed)
Case Management Note  Patient Details  Name: Barry [facility] MRN: [place]:[id number] Date of Birth: 1945/11/06  Subjective/Objective:                    Action/Plan:   Expected Discharge Date:  10/10/14               Expected Discharge Plan:  Home/Self Care  In-House Referral:  NA  Discharge planning Services  CM Consult  Post Acute Care Choice:  NA Choice offered to:  NA  DME Arranged:    DME Agency:     HH Arranged:    [place] Agency:     Status of Service:  Completed, signed off  Medicare Important Message Given:  N/A - LOS <3 / Initial given by admissions Date Medicare IM Given:    Medicare IM give by:    Date Additional Medicare IM Given:    Additional Medicare Important Message give by:     If discussed at [place] of Stay Meetings, dates discussed:    Additional Comments: Pt discharged home today. No CM needs noted. [facility] [place] is aware of pts admission. Will send discharge summary.  Christinia Gully On[place], RN 10/08/2014, 11:20 AM

## 2014-10-08 NOTE — Progress Notes (Signed)
Patient states understanding of discharge instructions, prescription given. 

## 2014-10-08 NOTE — Discharge Summary (Signed)
Physician Discharge Summary  Barry Lawrence [id number] DOB: 1946/01/25 DOA: 10/06/2014  PCP: [name], MD  Admit date: 10/06/2014 Discharge date: 10/08/2014  Time spent: 40 minutes  Recommendations for Outpatient Follow-up:  1. Follow up with nephrologist at [facility] as scheduled. Lab work scheduled for 10/09/14. Review of meds given worsening renal failure. 2. Follow up with PCP 1-2 weeks for evaluation of cellulitis.  3. Follow up with INR check and dosing of coumadin 10/09/14  Discharge Diagnoses:  Active Problems:   CAD (coronary artery disease)   Hypertensive heart disease   Hyperlipidemia   COPD GOLD II still smoking    Cigarette smoker   CKD (chronic kidney disease), stage III   PAD (peripheral artery disease)   Atrial fibrillation   Acute on chronic renal failure   Long-term (current) use of anticoagulants   Cellulitis of left lower extremity   Cellulitis   Discharge Condition: stable  Diet recommendation: heart healthy carb modified  Filed Weights   10/06/14 1906 10/07/14 0555 10/08/14 0553  Weight: 79.833 kg (176 lb) 79.425 kg (175 lb 1.6 oz) 79.878 kg (176 lb 1.6 oz)    History of present illness:  Barry Lawrence is a 69 y.o. male has a past medical history significant for coronary artery disease status post CABG, peripheral arterial disease status post aortobifemoral bypass by vascular surgery, presented to the emergency room on 10/06/14 with a chief complaint of left lower extremity redness that had been progressively worsening over the previous few days. Patient reported that initially he noticed a small red area over his in her thigh on the left , which had been progressing and spread to his left pretibial area. He denied any injuries or any bug bites to the area. He never had an episode like this in the past. He denied any chest pain, denied any breathing difficulties, however endorses cough which has been intermittent for a while. His cough  nonproductive. He  endorsed subjective fever and chills at home as well getting worse along with his redness. He denied any abdominal pain, nausea vomiting or diarrhea. He denied any lightheadedness or dizziness. In the emergency room, patient had stable vital signs, his blood work was remarkable for BUN of 62 and a creatinine of 3.3. His INR is 1.95. TRH was asked for admission for left lower extremity cellulitis.   Hospital Course:  Left lower extremity cellulitis - etiology unclear. initally vancomycin started but in light of ARF chnaged to ancef per.  Erythema quickly improved to LE on left.  Patient with good pulses, and per Dr. [name] his Ankle-brachial indices bilaterally were normal. A1c 5.5. Remained afebrile and non-toxic appearing. Will be discharged with 6 days doxy. Recommend close OP follow up with PCP for resolution of cellutiis.   AKI on CKD stage III-IV - In February 2016, patient's creatinine was 2.0 and he believes his baseline is 3.0. Provided with IV fluid and lasix held. Creatinine trended downward. At discharge creatinine 2.9 down from 3.3. Will discharge with instructions to hold lasix for 2 days to resume on 10/11/14. He is  stablished with a nephrologist at the [facility] and has appointment for labs 10/09/14 and [place] with follow up appointment next week. Urine output good.  COPD  - no wheezing, ongoing tobacco abuse: cessation counseling offered.  CXR Vascular congestion and borderline cardiomegaly. Mild bibasilar airspace opacities likely reflect atelectasis. Oxygen saturation level >90% on room air.   PAD - remained stable at baseline  A fib -  remained rate controlled, continue Coumadin INR 1.85. Follow up with PCP for INR check tomorrow with coumadin dose  Tobacco abuse - counseled for cessation.   HLD  - continue statin. Lipid panel unrmarkable  CAD  - no chest pain.   Procedures:  none  Consultations:  none  Discharge Exam: Filed Vitals:   10/08/14 0553  BP:  153/83  Pulse: 66  Temp: 98.3 F (36.8 C)  Resp: 18    General: well nourished appears comfortable Cardiovascular: irregularly irregular no MGR no LE edema Respiratory: normal effort BS clear bilaterally no wheeze Extremities: left lower leg with receding erythema, no heat or swelling. Non-tender  Discharge Instructions   Discharge Instructions    Diet - low sodium heart healthy    Complete by:  As directed      Discharge instructions    Complete by:  As directed   Take medication as directed Follow up with nephrology as scheduled Lab work 10/09/14 as scheduled Hold lasix for 2 days resume 10/11/14     Increase activity slowly    Complete by:  As directed           Current Discharge Medication List    START taking these medications   Details  doxycycline (VIBRAMYCIN) 100 MG capsule Take 1 capsule (100 mg total) by mouth 2 (two) times daily. Qty: 12 capsule, Refills: 0      CONTINUE these medications which have CHANGED   Details  furosemide (LASIX) 40 MG tablet Hold for 2 days then resume 5/20. Qty: 30 tablet    potassium chloride SA (K-DUR,KLOR-CON) 20 MEQ tablet Hold for 2 days then resume 10/11/14      CONTINUE these medications which have NOT CHANGED   Details  amLODipine (NORVASC) 10 MG tablet Take 10 mg by mouth daily before breakfast.     atorvastatin (LIPITOR) 80 MG tablet Take 80 mg by mouth at bedtime.     carvedilol (COREG) 12.5 MG tablet Take 1 tablet (12.5 mg total) by mouth 2 (two) times daily with a meal. Qty: 60 tablet, Refills: 11    Cholecalciferol (VITAMIN D3) 5000 UNITS CAPS Take 5,000 Units by mouth daily.    omeprazole (PRILOSEC) 20 MG capsule Take 20 mg by mouth at bedtime.     warfarin (COUMADIN) 2 MG tablet Take 1 tablet (2 mg total) by mouth daily. Qty: 30 tablet, Refills: 3      STOP taking these medications     Aspirin-Acetaminophen-Caffeine (GOODY HEADACHE PO)      fenofibrate 160 MG tablet        Allergies  Allergen  Reactions  . Niaspan [Niacin Er] Hives   Follow-up Information    Follow up with [facility] On 10/15/2014.   Why:  at 1:00 pm   Contact information:   [phone]       The results of significant diagnostics from this hospitalization (including imaging, microbiology, ancillary and laboratory) are listed below for reference.    Significant Diagnostic Studies: Dg Chest 2 View  10/06/2014   CLINICAL DATA:  Acute onset of chronic nonproductive cough. Cellulitis. Initial encounter.  EXAM: CHEST  2 VIEW  COMPARISON:  Chest radiograph performed 06/19/2014, and CT of the chest performed 09/04/2014  FINDINGS: The lungs are well-aerated. Vascular congestion is noted. Mild bibasilar opacities likely reflect atelectasis. There is no evidence of pleural effusion or pneumothorax.  The heart is borderline enlarged. The patient is status post median sternotomy, with evidence of prior CABG. No acute osseous  abnormalities are seen.  IMPRESSION: Vascular congestion and borderline cardiomegaly. Mild bibasilar airspace opacities likely reflect atelectasis.   Electronically Signed   By: Garald Balding M.D.   On: 10/06/2014 23:22    Microbiology: No results found for this or any previous visit (from the past 240 hour(s)).   Labs: Basic Metabolic Panel:  Recent Labs Lab 10/06/14 1657 10/07/14 0601 10/08/14 0551  NA 137 136 136  K 4.1 3.9 3.6  CL 101 101 101  CO2 25 24 24   GLUCOSE 123* 92 84  BUN 62* 61* 60*  CREATININE 3.34* 3.21* 2.99*  CALCIUM 9.4 9.3 9.0   Liver Function Tests:  Recent Labs Lab 10/06/14 1657  AST 20  ALT 19  ALKPHOS 34*  BILITOT 0.5  PROT 7.5  ALBUMIN 3.6   No results for input(s): LIPASE, AMYLASE in the last 168 hours. No results for input(s): AMMONIA in the last 168 hours. CBC:  Recent Labs Lab 10/06/14 1657 10/07/14 0601  WBC 8.2 6.3  NEUTROABS 5.2  --   HGB 14.4 13.6  HCT 42.9 41.5  MCV 87.6 87.9  PLT 254 260   Cardiac Enzymes: No results for  input(s): CKTOTAL, CKMB, CKMBINDEX, TROPONINI in the last 168 hours. BNP: BNP (last 3 results) No results for input(s): BNP in the last 8760 hours.  ProBNP (last 3 results) No results for input(s): PROBNP in the last 8760 hours.  CBG:  Recent Labs Lab 10/06/14 2022 10/07/14 0803 10/07/14 1151 10/07/14 2038 10/08/14 0719  GLUCAP 144* 89 99 109* 85       Signed:  Walton Digilio M  [facility] 10/08/2014, 11:04 AM

## 2014-10-08 NOTE — Progress Notes (Signed)
[place] for Coumadin Indication: atrial fibrillation  Allergies  Allergen Reactions  . Niaspan [Niacin Er] Hives   Patient Measurements: Height: 5\' 7"  (170.2 cm) Weight: 176 lb 1.6 oz (79.878 kg) IBW/kg (Calculated) : 66.1  Vital Signs: Temp: 98.3 F (36.8 C) (05/17 0553) Temp Source: Oral (05/17 0553) BP: 153/83 mmHg (05/17 0553) Pulse Rate: 66 (05/17 0553)  Labs:  Recent Labs  10/06/14 1657 10/07/14 0601 10/08/14 0551  HGB 14.4 13.6  --   HCT 42.9 41.5  --   PLT 254 260  --   LABPROT 22.4* 22.6* 21.5*  INR 1.95* 1.97* 1.85*  CREATININE 3.34* 3.21* 2.99*   Estimated Creatinine Clearance: 23.9 mL/min (by C-G formula based on Cr of 2.99).  Medical History: Past Medical History  Diagnosis Date  . CAD (coronary artery disease)     myoview 04/21/11-normal, EF49%  . S/P CABG (coronary artery bypass graft) 1996  . Hyperlipemia   . Polycythemia     H/O  . GERD (gastroesophageal reflux disease)   . Tobacco abuse   . CKD (chronic kidney disease), stage III   . Myocardial infarction 1996  . OSA on CPAP     CPAP q night , CPAP is through [place], states doesn't remember when he had the last study  . Pneumonia     hosp. as a child with pneumonia, not since   . Diabetes     BORDERLINE  . Headache     uses [employer], states he is lessening what he is taking for prep. for surgery   . Arthritis     in his back  . Hypertension     stress test- done 04/2014, followed by Dr. Gwenlyn Found  . Carotid stenosis 01/15/08    right endarterectomy-Dr. Amedeo Plenty  . PAD (peripheral artery disease) 06/12/09    a. 11/22/07 PTA & stenting right external iliac artery;bilateral iliac & PTI and stenting 1997;right ICA =>50% reduction,right SFA >50%,left ICA at stent => 50% reduction,left EIA => 50% reduction,left ATA occluded, left SFA mid > 49% reduction;  03/2014 Angio: LRA 90, patent L Iliac stent and distal RCIA stent, large saccular AAA.  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 problem along with left renal artery bypass   Medications:  Prescriptions prior to admission  Medication Sig Dispense Refill Last Dose  . amLODipine (NORVASC) 10 MG tablet Take 10 mg by mouth daily before breakfast.    10/06/2014 at Unknown time  . Aspirin-Acetaminophen-Caffeine (GOODY HEADACHE PO) Take 1 packet by mouth every hour as needed (for pain).    Past Month at Unknown time  . atorvastatin (LIPITOR) 80 MG tablet Take 80 mg by mouth at bedtime.    10/05/2014 at Unknown time  . carvedilol (COREG) 12.5 MG tablet Take 1 tablet (12.5 mg total) by mouth 2 (two) times daily with a meal. 60 tablet 11 10/06/2014 at [place]  . Cholecalciferol (VITAMIN D3) 5000 UNITS CAPS Take 5,000 Units by mouth daily.   10/06/2014 at Unknown time  . fenofibrate 160 MG tablet Take 160 mg by mouth at bedtime.    10/05/2014 at Unknown time  . furosemide (LASIX) 40 MG tablet Take 40 mg by mouth 2 (two) times daily.   10/06/2014 at Unknown time  . omeprazole (PRILOSEC) 20 MG capsule Take 20 mg by mouth at bedtime.    10/05/2014 at Unknown time  .  potassium chloride SA (K-DUR,KLOR-CON) 20 MEQ tablet Take 20 mEq by mouth daily.   10/06/2014 at Unknown time  . warfarin (COUMADIN) 2 MG tablet Take 1 tablet (2 mg total) by mouth daily. (Patient taking differently: Take 1-2 mg by mouth daily. 2mg  on all days except on Sunday, take 1/2 tablet) 30 tablet 3 10/05/2014 at 2200   Assessment: Patient on chronic Coumadin for AFIB.  Home dose listed above.  INR slightly sub-therapeutic on admission & trending down today. No bleeding noted.   Goal of Therapy:  INR 2-3   Plan:  Coumadin 3 mg po x1 dose today INR daily  Biagio Borg 10/08/2014,9:25 AM

## 2014-10-10 ENCOUNTER — Ambulatory Visit (HOSPITAL_BASED_OUTPATIENT_CLINIC_OR_DEPARTMENT_OTHER)
Admission: RE | Admit: 2014-10-10 | Discharge: 2014-10-10 | Disposition: A | Discharge status: Home / Self Care | Payer: Medicare Other | Source: Ambulatory Visit | Attending: Cardiovascular Disease | Admitting: Cardiovascular Disease

## 2014-10-10 ENCOUNTER — Ambulatory Visit (HOSPITAL_COMMUNITY)
Admission: RE | Admit: 2014-10-10 | Discharge: 2014-10-10 | Disposition: A | Discharge status: Home / Self Care | Payer: Medicare Other | Source: Ambulatory Visit | Attending: Cardiovascular Disease | Admitting: Cardiovascular Disease

## 2014-10-10 DIAGNOSIS — I739 Peripheral vascular disease, unspecified: Secondary | ICD-10-CM | POA: Diagnosis not present

## 2014-10-10 DIAGNOSIS — Z9889 Other specified postprocedural states: Secondary | ICD-10-CM | POA: Diagnosis not present

## 2014-10-10 DIAGNOSIS — I714 Abdominal aortic aneurysm, without rupture, unspecified: Secondary | ICD-10-CM

## 2014-10-10 DIAGNOSIS — I6522 Occlusion and stenosis of left carotid artery: Secondary | ICD-10-CM | POA: Insufficient documentation

## 2014-10-14 ENCOUNTER — Ambulatory Visit (INDEPENDENT_AMBULATORY_CARE_PROVIDER_SITE_OTHER): Payer: Medicare Other | Admitting: *Deleted

## 2014-10-14 DIAGNOSIS — Z7901 Long term (current) use of anticoagulants: Secondary | ICD-10-CM

## 2014-10-14 DIAGNOSIS — I480 Paroxysmal atrial fibrillation: Secondary | ICD-10-CM

## 2014-10-14 DIAGNOSIS — I4891 Unspecified atrial fibrillation: Secondary | ICD-10-CM | POA: Diagnosis not present

## 2014-10-14 LAB — POCT INR: INR: 1.4

## 2014-10-25 ENCOUNTER — Encounter: Payer: Self-pay | Admitting: Internal Medicine

## 2014-10-25 ENCOUNTER — Ambulatory Visit (INDEPENDENT_AMBULATORY_CARE_PROVIDER_SITE_OTHER): Payer: No Typology Code available for payment source | Admitting: Internal Medicine

## 2014-10-25 VITALS — BP 156/74 | HR 58 | Ht 67.0 in | Wt 187.0 lb

## 2014-10-25 DIAGNOSIS — R911 Solitary pulmonary nodule: Secondary | ICD-10-CM

## 2014-10-25 DIAGNOSIS — F17210 Nicotine dependence, cigarettes, uncomplicated: Secondary | ICD-10-CM | POA: Diagnosis not present

## 2014-10-25 DIAGNOSIS — J449 Chronic obstructive pulmonary disease, unspecified: Secondary | ICD-10-CM | POA: Diagnosis not present

## 2014-10-25 DIAGNOSIS — Z720 Tobacco use: Secondary | ICD-10-CM | POA: Diagnosis not present

## 2014-10-25 MED ORDER — TIOTROPIUM BROMIDE MONOHYDRATE 2.5 MCG/ACT IN AERS: 2.0000 | INHALATION_SPRAY | Freq: Every day | RESPIRATORY_TRACT | Status: DC

## 2014-10-25 NOTE — Patient Instructions (Addendum)
Try spiriva respimat 2 puffs each am x 2 weeks and if you like it fill it  As long as you stop smoking now, you should be able to preserve adequate lung function as your COPD is mild  When fluid builds up in your legs it is likely in your lungs and cause you to wheeze and look like you have copd flare but you probably don't > see you nephrologist  We need to change your coreg to alternative called bisoprol because coreg can cause wheezing especially in higher doses.  Take prilosec Take 30-60 min before first meal of the day    If you are satisfied with your treatment plan,  let your doctor know and he/she can either refill your medications or you can return here when your prescription runs out.     If in any way you are not 100% satisfied,  please tell us.  If 100% better, tell your friends!  Pulmonary follow up is as needed

## 2014-10-25 NOTE — Progress Notes (Signed)
Subjective:    Patient ID: Barry Lawrence, male    DOB: October 25, 1945  MRN: Barry [facility]:[id number]  HPI  6 yowm active smoker followed by Barry Lawrence for OSA being considered for AAA surgery and sent for preop eval by Dr [name] to pulmonary clinic 03/26/2014    03/26/2014 1st [facility] office visit/ Barry Lawrence   Chief Complaint  Patient presents with  . Pulmonary Consult    Referred by Dr. [name] for pulmonary clearance for AAA surgery.  Pt c/o SOB "over a period of time". He states that his legs usually give out before his breathing bothers him.    not on any resp rx at this point  Sleeps ok flat on cpap  with min am cough production thick and white and no tendency to acute excacerbations rec  You have very little copd and unlikely you ever will if you stop smoking now (should stop for at least 2 weeks preop anyway) The main problem with surgery will be the need to lie flat and the effect of your weight on your breathing so early mobilization and minimum sedating meds will be key   Admit date: 10/06/2014 Discharge date: 10/08/2014   Recommendations for Outpatient Follow-up:  1. Follow up with nephrologist at [facility] as scheduled. Lab work scheduled for 10/09/14. Review of meds given worsening renal failure. 2. Follow up with PCP 1-2 weeks for evaluation of cellulitis.  3. Follow up with INR check and dosing of coumadin 10/09/14  Discharge Diagnoses:  Active Problems:  CAD (coronary artery disease)  Hypertensive heart disease  Hyperlipidemia  COPD GOLD II still smoking   Cigarette smoker  CKD (chronic kidney disease), stage III  PAD (peripheral artery disease)  Atrial fibrillation  Acute on chronic renal failure  Long-term (current) use of anticoagulants  Cellulitis of left lower extremity  Cellulitis     10/25/2014 f/u ov/Barry Lawrence re: GOLD II copd/ still smoking  Chief Complaint  Patient presents with  . Follow-up    Pt states breathing is no better since last visit here in Nov 2015.  He states that he gets SOB with just talking and walking short distances.  He has been having swelling in his legs for the past couple wks.    Sleeps ok on cpap per va - really helps noct sob/ wheeze  Swelling in legs getting worse during day  Neb helped breathing perioperatively  Doe x > nl pace or inclines  = MMRC 1    No obvious day to day or daytime variabilty or assoc chronic cough or cp or chest tightness, subjective wheeze overt sinus or hb symptoms. No unusual exp hx or h/o childhood pna/ asthma or knowledge of premature birth.  Sleeping ok without nocturnal  or early am exacerbation  of respiratory  c/o's or need for noct saba. Also denies any obvious fluctuation of symptoms with weather or environmental changes or other aggravating or alleviating factors except as outlined above   Current Medications, Allergies, Complete Past Medical History, Past Surgical History, Family History, and Social History were reviewed in [employer] record.  ROS  The following are not active complaints unless bolded sore throat, dysphagia, dental problems, itching, sneezing,  nasal congestion or excess/ purulent secretions, ear ache,   fever, chills, sweats, unintended wt loss, pleuritic or exertional cp, hemoptysis,  orthopnea pnd or leg swelling, presyncope, palpitations, heartburn, abdominal pain, anorexia, nausea, vomiting, diarrhea  or change in bowel or urinary habits, change in stools or urine, dysuria,hematuria,  rash, arthralgias, visual complaints, headache, numbness weakness or ataxia or problems with walking or coordination,  change in mood/affect or memory.              Objective:   Physical Exam  amb  Wm nad/ mod hoarse with prominent pseudowheeze   10/25/2014         188  Wt Readings from Last 3 Encounters:  03/26/14 201 lb (91.173 kg)  03/18/14 197 lb 8 oz (89.585 kg)  02/19/14 196 lb (88.905 kg)      HEENT: nl dentition, turbinates, and orophanx. Nl external  ear canals without cough reflex   NECK :  without JVD/Nodes/TM/ nl carotid upstrokes bilaterally   LUNGS: no acc muscle use,  Min insp / exp rhonchi   CV:  RRR  no s3 or murmur or increase in P2,  1+ pitting  edema   ABD: pot bellied  soft and nontender with decreased excursion but no def hoover in the supine position. No bruits or organomegaly, bowel sounds nl  MS:  warm without deformities, calf tenderness, cyanosis or clubbing  SKIN: warm and dry without lesions    NEURO:  alert, approp, no deficits       I personally reviewed images and agree with radiology impression as follows:  CXR:  10/06/14 Vascular congestion and borderline cardiomegaly. Mild bibasilar airspace opacities likely reflect atelectasis.      Assessment & Plan:

## 2014-10-25 NOTE — Assessment & Plan Note (Signed)
03/26/2014 spirometry FEV1  1.51 (51%) ratio 69 off all rx   The proper method of use, as well as anticipated side effects, of a metered-dose(respimat inhaler are discussed and demonstrated to the patient. Improved effectiveness after extensive coaching during this visit to a level of approximately  90% try spiriva 2 pffs each am       I had an extended discussion with the patient reviewing all relevant studies completed to date and  lasting 15 to 20 minutes of a 25 minute visit on the following ongoing concerns:   1) no evidence that his copd is worse and unlikely to progress from a GOLD II status  if stops smoking now  2) airways symptoms though remain difficult to control. DDX of  difficult airways management all start with A and  include Adherence, Ace Inhibitors, Acid Reflux, Active Sinus Disease, Alpha 1 Antitripsin deficiency, Anxiety masquerading as Airways dz,  ABPA,  allergy(esp in young), Aspiration (esp in elderly), Adverse effects of DPI,  Active smokers, plus two Bs  = Bronchiectasis and Beta blocker use..and one C= CHF   Adherence is always the initial "prime suspect" and is a multilayered concern that requires a "trust but verify" approach in every patient - starting with knowing how to use medications, especially inhalers, correctly, keeping up with refills and understanding the fundamental difference between maintenance and prns vs those medications only taken for a very short course and then stopped and not refilled.   Active smoking greates concern > see sep a/p  Beta blocker use > Strongly prefer in this setting: Bystolic, the most beta -1  selective Beta blocker available in sample form, with bisoprolol the most selective generic choice  on the market.   CHF/ vol overload related to CRI clearly a challenge with "cardiac asthma" in ddx and best option with BB on board = LAMA so rec trial of spiriva 2 puffs q am   Each maintenance medication was reviewed in detail including most  importantly the difference between maintenance and as needed and under what circumstances the prns are to be used.  Please see instructions for details which were reviewed in writing and the patient given a copy.

## 2014-10-25 NOTE — Assessment & Plan Note (Signed)
CT chest 03/05/14 6 mm pulmonary nodule at the left lung apex. If the patient is at high risk for bronchogenic carcinoma, follow-up chest CT at 6-12 months is recommended - repeat 09/04/2014 > no change > rec repeat 09/04/15   CT results reviewed with pt >>> Too small for PET or bx, not suspicious enough for excisional bx > really only option for now is follow the Fleischner society guidelines as rec by radiology > f/u at one year as planned

## 2014-10-25 NOTE — Assessment & Plan Note (Signed)
>   3 min discussion I reviewed the Fletcher curve with the patient that basically indicates  if you quit smoking when your best day FEV1 is still well preserved (as is clearly  the case here)  it is highly unlikely you will progress to severe disease and informed the patient there was no medication on the market that has proven to alter the curve/ its downward trajectory  or the likelihood of progression of their disease.  Therefore stopping smoking and maintaining abstinence is the most important aspect of care, not choice of inhalers or for that matter, doctors > so pulmonary f/u is prn

## 2014-10-28 ENCOUNTER — Ambulatory Visit (INDEPENDENT_AMBULATORY_CARE_PROVIDER_SITE_OTHER): Payer: Medicare Other | Admitting: *Deleted

## 2014-10-28 DIAGNOSIS — Z7901 Long term (current) use of anticoagulants: Secondary | ICD-10-CM | POA: Diagnosis not present

## 2014-10-28 DIAGNOSIS — I4891 Unspecified atrial fibrillation: Secondary | ICD-10-CM | POA: Diagnosis not present

## 2014-10-28 DIAGNOSIS — I480 Paroxysmal atrial fibrillation: Secondary | ICD-10-CM

## 2014-10-28 LAB — POCT INR: INR: 1.2

## 2014-11-04 ENCOUNTER — Ambulatory Visit (INDEPENDENT_AMBULATORY_CARE_PROVIDER_SITE_OTHER): Payer: Medicare Other | Admitting: *Deleted

## 2014-11-04 DIAGNOSIS — I4891 Unspecified atrial fibrillation: Secondary | ICD-10-CM

## 2014-11-04 DIAGNOSIS — Z7901 Long term (current) use of anticoagulants: Secondary | ICD-10-CM

## 2014-11-04 DIAGNOSIS — I480 Paroxysmal atrial fibrillation: Secondary | ICD-10-CM

## 2014-11-04 LAB — POCT INR: INR: 1.2

## 2014-11-11 ENCOUNTER — Ambulatory Visit (INDEPENDENT_AMBULATORY_CARE_PROVIDER_SITE_OTHER): Payer: Medicare Other | Admitting: *Deleted

## 2014-11-11 ENCOUNTER — Telehealth: Payer: Self-pay | Admitting: Internal Medicine

## 2014-11-11 DIAGNOSIS — Z7901 Long term (current) use of anticoagulants: Secondary | ICD-10-CM

## 2014-11-11 DIAGNOSIS — I480 Paroxysmal atrial fibrillation: Secondary | ICD-10-CM

## 2014-11-11 DIAGNOSIS — I4891 Unspecified atrial fibrillation: Secondary | ICD-10-CM | POA: Diagnosis not present

## 2014-11-11 LAB — POCT INR: INR: 1.6

## 2014-11-11 MED ORDER — TIOTROPIUM BROMIDE MONOHYDRATE 2.5 MCG/ACT IN AERS: 2.0000 | INHALATION_SPRAY | Freq: Every day | RESPIRATORY_TRACT | Status: DC

## 2014-11-11 NOTE — Telephone Encounter (Signed)
Rx has been faxed in. Pt is aware. Nothing further was needed.

## 2014-11-13 ENCOUNTER — Encounter: Payer: Self-pay | Admitting: Surgery

## 2014-11-18 ENCOUNTER — Encounter: Payer: Self-pay | Admitting: Surgery

## 2014-11-18 ENCOUNTER — Ambulatory Visit (INDEPENDENT_AMBULATORY_CARE_PROVIDER_SITE_OTHER): Payer: Medicare Other | Admitting: Surgery

## 2014-11-18 ENCOUNTER — Ambulatory Visit (INDEPENDENT_AMBULATORY_CARE_PROVIDER_SITE_OTHER): Payer: Medicare Other | Admitting: *Deleted

## 2014-11-18 ENCOUNTER — Ambulatory Visit (HOSPITAL_COMMUNITY)
Admission: RE | Admit: 2014-11-18 | Discharge: 2014-11-18 | Disposition: A | Discharge status: Home / Self Care | Payer: Medicare Other | Source: Ambulatory Visit | Attending: Surgery | Admitting: Surgery

## 2014-11-18 VITALS — BP 176/91 | HR 54 | Ht 67.0 in | Wt 187.6 lb

## 2014-11-18 DIAGNOSIS — I4891 Unspecified atrial fibrillation: Secondary | ICD-10-CM

## 2014-11-18 DIAGNOSIS — I480 Paroxysmal atrial fibrillation: Secondary | ICD-10-CM

## 2014-11-18 DIAGNOSIS — Z7901 Long term (current) use of anticoagulants: Secondary | ICD-10-CM | POA: Diagnosis not present

## 2014-11-18 DIAGNOSIS — Z48812 Encounter for surgical aftercare following surgery on the circulatory system: Secondary | ICD-10-CM

## 2014-11-18 DIAGNOSIS — I714 Abdominal aortic aneurysm, without rupture, unspecified: Secondary | ICD-10-CM

## 2014-11-18 DIAGNOSIS — I701 Atherosclerosis of renal artery: Secondary | ICD-10-CM

## 2014-11-18 LAB — POCT INR: INR: 2.6

## 2014-11-18 NOTE — Patient Instructions (Addendum)
Please review the tobacco cessation information given to you today. It lists many hints that are useful in your effort to stop smoking. The [facility] contact phone # is [phone] These nurses and advisors offer lots of FREE information and aids to help you quit.    The [facility]oking line #  (541)814-7852, they will also assist you with programs designed to help you stop smoking.     Smoking Cessation, Tips for Success If you are ready to quit smoking, congratulations! You have chosen to help yourself be healthier. Cigarettes bring nicotine, tar, carbon monoxide, and other irritants into your body. Your lungs, heart, and blood vessels will be able to work better without these poisons. There are many different ways to quit smoking. Nicotine gum, nicotine patches, a nicotine inhaler, or nicotine nasal spray can help with physical craving. Hypnosis, support groups, and medicines help break the habit of smoking. WHAT THINGS CAN I DO TO MAKE QUITTING EASIER?  Here are some tips to help you quit for good:  Pick a date when you will quit smoking completely. Tell all of your friends and family about your plan to quit on that date.  Do not try to slowly cut down on the number of cigarettes you are smoking. Pick a quit date and quit smoking completely starting on that day.  Throw away all cigarettes.   Clean and remove all ashtrays from your home, work, and car.  On a card, write down your reasons for quitting. Carry the card with you and read it when you get the urge to smoke.  Cleanse your body of nicotine. Drink enough water and fluids to keep your urine clear or pale yellow. Do this after quitting to flush the nicotine from your body.  Learn to predict your moods. Do not let a bad situation be your excuse to have a cigarette. Some situations in your life might tempt you into wanting a cigarette.  Never have "just one" cigarette. It leads to wanting another and another. Remind  yourself of your decision to quit.  Change habits associated with smoking. If you smoked while driving or when feeling stressed, try other activities to replace smoking. Stand up when drinking your coffee. Brush your teeth after eating. Sit in a different chair when you read the paper. Avoid alcohol while trying to quit, and try to drink fewer caffeinated beverages. Alcohol and caffeine may urge you to smoke.  Avoid foods and drinks that can trigger a desire to smoke, such as sugary or spicy foods and alcohol.  Ask people who smoke not to smoke around you.  Have something planned to do right after eating or having a cup of coffee. For example, plan to take a walk or exercise.  Try a relaxation exercise to calm you down and decrease your stress. Remember, you may be tense and nervous for the first 2 weeks after you quit, but this will pass.  Find new activities to keep your hands busy. Play with a pen, coin, or rubber band. Doodle or draw things on paper.  Brush your teeth right after eating. This will help cut down on the craving for the taste of tobacco after meals. You can also try mouthwash.   Use oral substitutes in place of cigarettes. Try using lemon drops, carrots, cinnamon sticks, or chewing gum. Keep them handy so they are available when you have the urge to smoke.  When you have the urge to smoke, try deep breathing.  Designate your home as a nonsmoking area.  If you are a heavy smoker, ask your health care provider about a prescription for nicotine chewing gum. It can ease your withdrawal from nicotine.  Reward yourself. Set aside the cigarette money you save and buy yourself something nice.  Look for support from others. Join a support group or smoking cessation program. Ask someone at home or at work to help you with your plan to quit smoking.  Always ask yourself, "Do I need this cigarette or is this just a reflex?" Tell yourself, "Today, I choose not to smoke," or "I do  not want to smoke." You are reminding yourself of your decision to quit.  Do not replace cigarette smoking with electronic cigarettes (commonly called e-cigarettes). The safety of e-cigarettes is unknown, and some may contain harmful chemicals.  If you relapse, do not give up! Plan ahead and think about what you will do the next time you get the urge to smoke. HOW WILL I FEEL WHEN I QUIT SMOKING? You may have symptoms of withdrawal because your body is used to nicotine (the addictive substance in cigarettes). You may crave cigarettes, be irritable, feel very hungry, cough often, get headaches, or have difficulty concentrating. The withdrawal symptoms are only temporary. They are strongest when you first quit but will go away within 10-14 days. When withdrawal symptoms occur, stay in control. Think about your reasons for quitting. Remind yourself that these are signs that your body is healing and getting used to being without cigarettes. Remember that withdrawal symptoms are easier to treat than the major diseases that smoking can cause.  Even after the withdrawal is over, expect periodic urges to smoke. However, these cravings are generally short lived and will go away whether you smoke or not. Do not smoke! WHAT RESOURCES ARE AVAILABLE TO HELP ME QUIT SMOKING? Your health care provider can direct you to community resources or hospitals for support, which may include:  Group support.  Education.  Hypnosis.  Therapy.

## 2014-11-18 NOTE — Progress Notes (Signed)
Patient name: Barry Lawrence MRN: [place]:[id number] DOB: Nov 28, 1945 Sex: male     Chief Complaint  Patient presents with  . Re-evaluation    3 month f/u renal duplex - c/o seeing blood in urine today    HISTORY OF PRESENT ILLNESS: The patient is back for follow-up. He is status post aortobifemoral bypass with 16x8 graft and left renal artery bypass with 21mm graft. This was done for claudication and an AAA as well as renal artery stenosis. He was not a stent graft candidate secondary to previously placed iliac stents which were too small to advance a graft through. He had a prolonged hospital course complicated by respiratory failure requiring intubation for approximately 2 weeks. He also suffered acute renal failure which required dialysis for several days. His renal function recovered to where his creatinine returned to near baseline around 2.  He had a separation of his groin incisions which have now healed.  Unfortunately, he has started back smoking.  He has recently been evaluated at the [facility] and tells me that his creatinine has gotten worse and that he may have to start dialysis.  He is having increased fluid buildup in his legs which has somewhat responded to Lasix.  Past Medical History  Diagnosis Date  . CAD (coronary artery disease)     myoview 04/21/11-normal, EF49%  . S/P CABG (coronary artery bypass graft) 1996  . Hyperlipemia   . Polycythemia     H/O  . GERD (gastroesophageal reflux disease)   . Tobacco abuse   . CKD (chronic kidney disease), stage III   . Myocardial infarction 1996  . OSA on CPAP     CPAP q night , CPAP is through [place], states doesn't remember when he had the last study  . Pneumonia     hosp. as a child with pneumonia, not since   . Diabetes     BORDERLINE  . Headache     uses [employer], states he is lessening what he is taking for prep. for surgery   . Arthritis     in his back  . Hypertension     stress test- done 04/2014, followed by Dr.  Gwenlyn Found  . Carotid stenosis 01/15/08    right endarterectomy-Dr. Amedeo Plenty  . PAD (peripheral artery disease) 06/12/09    a. 11/22/07 PTA & stenting right external iliac artery;bilateral iliac & PTI and stenting 1997;right ICA =>50% reduction,right SFA >50%,left ICA at stent => 50% reduction,left EIA => 50% reduction,left ATA occluded, left SFA mid > 49% reduction;  03/2014 Angio: LRA 90, patent L Iliac stent and distal RCIA stent, large saccular AAA.  [name] Abdominal aortic aneurysm 01/28/10; 02/05/14    4.3x4.6cm;  now measuring 5.5 cm, status post aortobifemoral bypass grafting in January 2016 by Dr. problem along with left renal artery bypass    Past Surgical History  Procedure Laterality Date  . Carotid endarterectomy Right 01/15/08  . Lumbar disc surgery  1998  . Pv angio  11/22/2007    PTA/ stenting of right common iliac artery with a 10x6mm Smart stent and postdilated with a 7x7mmpowerflex balloon; high grade calcified stenosis of the RICA  . Colonoscopy N/A 07/09/2013    Procedure: COLONOSCOPY;  Surgeon: Juanita Craver, MD;  Location: [facility];  Service: Endoscopy;  Laterality: N/A;  . Pv angio  04/01/14    AAA, Lt renal artery stenosis, patent iliacs  . Abdominal aortagram N/A 04/01/2014    Procedure: ABDOMINAL Maxcine Ham;  Surgeon: Pearletha Forge  Gwenlyn Found, MD;  Location: [facility] CATH LAB;  Service: Cardiovascular;  Laterality: N/A;  . Back surgery  1988    at [facility]  . Coronary artery bypass graft  1996    LIMA to LAD, sequential vein to OM1 and 2 as well as acure marginal branch and diag branch  . Aorta - bilateral femoral artery bypass graft N/A 05/30/2014    Procedure: AORTOBIFEMORAL BYPASS GRAFT;  Surgeon: Serafina Mitchell, MD;  Location: [facility];  Service: Vascular;  Laterality: N/A;  . Aortic/renal bypass Left 05/30/2014    Procedure: LEFT RENAL ARTERY BYPASS;  Surgeon: Serafina Mitchell, MD;  Location: [facility];  Service: Vascular;  Laterality: Left;    History   Social History  . Marital Status: Married     Spouse Name: N/A  . Number of Children: 2  . Years of Education: N/A   Occupational History  . Retired Estate manager/land agent    Social History Main Topics  . Smoking status: Current Every Day Smoker -- 1.00 packs/day for 56 years    Types: Cigarettes  . Smokeless tobacco: Never Used  . Alcohol Use: No  . Drug Use: No  . Sexual Activity: Not on file   Other Topics Concern  . Not on file   Social History Narrative   Lives with wife.    Family History  Problem Relation Age of Onset  . Heart failure Father     Allergies as of 11/18/2014 - Review Complete 11/18/2014  Allergen Reaction Noted  . Niaspan [niacin er] Hives 07/15/2012    Current Outpatient Prescriptions on File Prior to Visit  Medication Sig Dispense Refill  . amLODipine (NORVASC) 10 MG tablet Take 10 mg by mouth daily before breakfast.     . atorvastatin (LIPITOR) 80 MG tablet Take 80 mg by mouth at bedtime.     . Cholecalciferol (VITAMIN D3) 5000 UNITS CAPS Take 5,000 Units by mouth daily.    . furosemide (LASIX) 40 MG tablet Take by mouth 2 (two) times daily. 60 mg q am and 40 mg q pm    . omeprazole (PRILOSEC) 20 MG capsule Take 20 mg by mouth at bedtime.     . potassium chloride SA (K-DUR,KLOR-CON) 20 MEQ tablet Take 20 mEq by mouth daily.    . Tiotropium Bromide Monohydrate (SPIRIVA RESPIMAT) 2.5 MCG/ACT AERS Inhale 2 puffs into the lungs daily. 1 Inhaler 5  . warfarin (COUMADIN) 2 MG tablet Take 1 tablet (2 mg total) by mouth daily. (Patient taking differently: Take 1-2 mg by mouth daily. 2mg  on all days except on Sunday, take 1/2 tablet) 30 tablet 3  . Aspirin-Acetaminophen-Caffeine (GOODY HEADACHE PO) as needed.    . carvedilol (COREG) 12.5 MG tablet Take 1 tablet (12.5 mg total) by mouth 2 (two) times daily with a meal. (Patient not taking: Reported on 11/18/2014) 60 tablet 11   No current facility-administered medications on file prior to visit.     REVIEW OF SYSTEMS: Cardiovascular: No chest pain, chest  pressure, palpitations, orthopnea, or dyspnea on exertion. No claudication or rest pain,  positive leg swelling Pulmonary: No productive cough, asthma or wheezing. Neurologic: No weakness, paresthesias, aphasia, or amaurosis. No dizziness. Hematologic: No bleeding problems or clotting disorders. Musculoskeletal: No joint pain or joint swelling. Gastrointestinal: No blood in stool or hematemesis Genitourinary: No dysuria or hematuria. Psychiatric:: No history of major depression. Integumentary: No rashes or ulcers. Constitutional: No fever or chills.  PHYSICAL EXAMINATION:   Vital signs are  [employer]  Vitals:   11/18/14 0952  BP: 176/91  Pulse: 54  Height: 5\' 7"  (1.702 m)  Weight: 187 lb 9.6 oz (85.095 kg)  SpO2: 95%   Body mass index is 29.38 kg/(m^2). General: The patient appears their stated age. HEENT:  No gross abnormalities Pulmonary:  Non labored breathing Abdomen: Soft and non-tender.  No hernia, however he does have diastases Musculoskeletal: There are no major deformities. Neurologic: No focal weakness or paresthesias are detected, Skin: There are no ulcer or rashes noted. Psychiatric: The patient has normal affect. Cardiovascular: There is a regular rate and rhythm without significant murmur appreciated.  Palpable pedal pulses.  2+ edema bilaterally   Diagnostic Studies Renal artery duplex was reviewed.  This was somewhat a technically limited study but the bypass graft was patent  Assessment: Status post aortobifemoral bypass graft with left renal bypass Plan: From a surgical perspective, the patient is doing well.  His incisions have healed.  I will have him follow-up in one year with repeat ABIs and a left renal artery duplex  He tells me that he is developing worsening renal failure and may be back to discuss fistula placement.  He is scheduled for follow-up studies at the [place] this week.  The lung nodule that was detected was followed up with repeat CT scan which  suggested this is scarring.  He is followed by Dr. Melvyn Novas for this.  He is going to have a repeat CAT scan in 1 year.  Eldridge Abrahams, M.D. [facility] Office: [phone] Pager:  [phone]

## 2014-11-20 ENCOUNTER — Other Ambulatory Visit: Payer: Self-pay | Admitting: Cardiovascular Disease

## 2014-11-29 ENCOUNTER — Encounter (HOSPITAL_COMMUNITY): Payer: Self-pay | Admitting: Emergency Medicine

## 2014-11-29 ENCOUNTER — Inpatient Hospital Stay (HOSPITAL_COMMUNITY)
Admission: EM | Admit: 2014-11-29 | Discharge: 2014-12-03 | DRG: 871 | Disposition: A | Discharge status: Home / Self Care | Payer: Medicare Other | Attending: Internal Medicine | Admitting: Internal Medicine

## 2014-11-29 ENCOUNTER — Emergency Department (HOSPITAL_COMMUNITY): Payer: Medicare Other

## 2014-11-29 ENCOUNTER — Inpatient Hospital Stay (HOSPITAL_COMMUNITY): Payer: Medicare Other

## 2014-11-29 DIAGNOSIS — Z8249 Family history of ischemic heart disease and other diseases of the circulatory system: Secondary | ICD-10-CM

## 2014-11-29 DIAGNOSIS — I4510 Unspecified right bundle-branch block: Secondary | ICD-10-CM | POA: Diagnosis present

## 2014-11-29 DIAGNOSIS — I252 Old myocardial infarction: Secondary | ICD-10-CM

## 2014-11-29 DIAGNOSIS — J189 Pneumonia, unspecified organism: Secondary | ICD-10-CM | POA: Diagnosis present

## 2014-11-29 DIAGNOSIS — I739 Peripheral vascular disease, unspecified: Secondary | ICD-10-CM | POA: Diagnosis present

## 2014-11-29 DIAGNOSIS — R0602 Shortness of breath: Secondary | ICD-10-CM | POA: Diagnosis present

## 2014-11-29 DIAGNOSIS — F17210 Nicotine dependence, cigarettes, uncomplicated: Secondary | ICD-10-CM | POA: Diagnosis present

## 2014-11-29 DIAGNOSIS — I4891 Unspecified atrial fibrillation: Secondary | ICD-10-CM | POA: Diagnosis present

## 2014-11-29 DIAGNOSIS — A4150 Gram-negative sepsis, unspecified: Secondary | ICD-10-CM | POA: Diagnosis not present

## 2014-11-29 DIAGNOSIS — I5032 Chronic diastolic (congestive) heart failure: Secondary | ICD-10-CM

## 2014-11-29 DIAGNOSIS — J150 Pneumonia due to Klebsiella pneumoniae: Secondary | ICD-10-CM | POA: Diagnosis present

## 2014-11-29 DIAGNOSIS — A4159 Other Gram-negative sepsis: Principal | ICD-10-CM | POA: Diagnosis present

## 2014-11-29 DIAGNOSIS — J156 Pneumonia due to other aerobic Gram-negative bacteria: Secondary | ICD-10-CM

## 2014-11-29 DIAGNOSIS — G4733 Obstructive sleep apnea (adult) (pediatric): Secondary | ICD-10-CM | POA: Diagnosis present

## 2014-11-29 DIAGNOSIS — E785 Hyperlipidemia, unspecified: Secondary | ICD-10-CM | POA: Diagnosis present

## 2014-11-29 DIAGNOSIS — K219 Gastro-esophageal reflux disease without esophagitis: Secondary | ICD-10-CM | POA: Diagnosis present

## 2014-11-29 DIAGNOSIS — Z7901 Long term (current) use of anticoagulants: Secondary | ICD-10-CM

## 2014-11-29 DIAGNOSIS — I5033 Acute on chronic diastolic (congestive) heart failure: Secondary | ICD-10-CM | POA: Diagnosis present

## 2014-11-29 DIAGNOSIS — E871 Hypo-osmolality and hyponatremia: Secondary | ICD-10-CM | POA: Diagnosis present

## 2014-11-29 DIAGNOSIS — Z959 Presence of cardiac and vascular implant and graft, unspecified: Secondary | ICD-10-CM | POA: Diagnosis not present

## 2014-11-29 DIAGNOSIS — Y95 Nosocomial condition: Secondary | ICD-10-CM | POA: Diagnosis present

## 2014-11-29 DIAGNOSIS — I129 Hypertensive chronic kidney disease with stage 1 through stage 4 chronic kidney disease, or unspecified chronic kidney disease: Secondary | ICD-10-CM | POA: Diagnosis present

## 2014-11-29 DIAGNOSIS — Z951 Presence of aortocoronary bypass graft: Secondary | ICD-10-CM | POA: Diagnosis not present

## 2014-11-29 DIAGNOSIS — N184 Chronic kidney disease, stage 4 (severe): Secondary | ICD-10-CM | POA: Diagnosis present

## 2014-11-29 DIAGNOSIS — Z888 Allergy status to other drugs, medicaments and biological substances status: Secondary | ICD-10-CM | POA: Diagnosis not present

## 2014-11-29 DIAGNOSIS — I6521 Occlusion and stenosis of right carotid artery: Secondary | ICD-10-CM | POA: Diagnosis present

## 2014-11-29 DIAGNOSIS — A419 Sepsis, unspecified organism: Secondary | ICD-10-CM

## 2014-11-29 DIAGNOSIS — G4730 Sleep apnea, unspecified: Secondary | ICD-10-CM | POA: Diagnosis present

## 2014-11-29 DIAGNOSIS — J449 Chronic obstructive pulmonary disease, unspecified: Secondary | ICD-10-CM | POA: Diagnosis present

## 2014-11-29 DIAGNOSIS — I2510 Atherosclerotic heart disease of native coronary artery without angina pectoris: Secondary | ICD-10-CM | POA: Diagnosis present

## 2014-11-29 LAB — COMPREHENSIVE METABOLIC PANEL
ALT: 16 U/L — ABNORMAL LOW (ref 17–63)
AST: 17 U/L (ref 15–41)
Albumin: 3.4 g/dL — ABNORMAL LOW (ref 3.5–5.0)
Alkaline Phosphatase: 98 U/L (ref 38–126)
Anion gap: 10 (ref 5–15)
BUN: 45 mg/dL — ABNORMAL HIGH (ref 6–20)
CO2: 24 mmol/L (ref 22–32)
Calcium: 8.8 mg/dL — ABNORMAL LOW (ref 8.9–10.3)
Chloride: 106 mmol/L (ref 101–111)
Creatinine, Ser: 2.42 mg/dL — ABNORMAL HIGH (ref 0.61–1.24)
GFR calc Af Amer: 30 mL/min — ABNORMAL LOW (ref >60)
GFR calc non Af Amer: 26 mL/min — ABNORMAL LOW (ref >60)
Glucose, Bld: 128 mg/dL — ABNORMAL HIGH (ref 65–99)
Potassium: 4 mmol/L (ref 3.5–5.1)
Sodium: 140 mmol/L (ref 135–145)
Total Bilirubin: 0.6 mg/dL (ref 0.3–1.2)
Total Protein: 6.2 g/dL — ABNORMAL LOW (ref 6.5–8.1)

## 2014-11-29 LAB — CBC WITH DIFFERENTIAL/PLATELET
Basophils Absolute: 0 10*3/uL (ref 0.0–0.1)
Basophils Relative: 0 % (ref 0–1)
Eosinophils Absolute: 0.1 10*3/uL (ref 0.0–0.7)
Eosinophils Relative: 1 % (ref 0–5)
HCT: 43.7 % (ref 39.0–52.0)
Hemoglobin: 14.4 g/dL (ref 13.0–17.0)
Lymphocytes Relative: 15 % (ref 12–46)
Lymphs Abs: 1.6 10*3/uL (ref 0.7–4.0)
MCH: 29.3 pg (ref 26.0–34.0)
MCHC: 33 g/dL (ref 30.0–36.0)
MCV: 89 fL (ref 78.0–100.0)
Monocytes Absolute: 0.3 10*3/uL (ref 0.1–1.0)
Monocytes Relative: 3 % (ref 3–12)
Neutro Abs: 8.6 10*3/uL — ABNORMAL HIGH (ref 1.7–7.7)
Neutrophils Relative %: 81 % — ABNORMAL HIGH (ref 43–77)
Platelets: 211 10*3/uL (ref 150–400)
RBC: 4.91 MIL/uL (ref 4.22–5.81)
RDW: 16.3 % — ABNORMAL HIGH (ref 11.5–15.5)
WBC: 10.6 10*3/uL — ABNORMAL HIGH (ref 4.0–10.5)

## 2014-11-29 LAB — URINALYSIS, ROUTINE W REFLEX MICROSCOPIC
Bilirubin Urine: NEGATIVE
Glucose, UA: NEGATIVE mg/dL
Ketones, ur: NEGATIVE mg/dL
Leukocytes, UA: NEGATIVE
Nitrite: NEGATIVE
Protein, ur: >300 mg/dL — AB
Specific Gravity, Urine: 1.016 (ref 1.005–1.030)
Urobilinogen, UA: 0.2 mg/dL (ref 0.0–1.0)
pH: 5 (ref 5.0–8.0)

## 2014-11-29 LAB — I-STAT ARTERIAL BLOOD GAS, ED
Acid-base deficit: 5 mmol/L — ABNORMAL HIGH (ref 0.0–2.0)
Bicarbonate: 21.9 mEq/L (ref 20.0–24.0)
O2 Saturation: 95 %
Patient temperature: 101.3
TCO2: 23 mmol/L (ref 0–100)
pCO2 arterial: 47.1 mmHg — ABNORMAL HIGH (ref 35.0–45.0)
pH, Arterial: 7.283 — ABNORMAL LOW (ref 7.350–7.450)
pO2, Arterial: 91 mmHg (ref 80.0–100.0)

## 2014-11-29 LAB — URINE MICROSCOPIC-ADD ON

## 2014-11-29 LAB — PROTIME-INR
INR: 2.95 — ABNORMAL HIGH (ref 0.00–1.49)
Prothrombin Time: 30.3 seconds — ABNORMAL HIGH (ref 11.6–15.2)

## 2014-11-29 LAB — I-STAT TROPONIN, ED: Troponin i, poc: 0.02 ng/mL (ref 0.00–0.08)

## 2014-11-29 LAB — MRSA PCR SCREENING: MRSA by PCR: NEGATIVE

## 2014-11-29 LAB — I-STAT CG4 LACTIC ACID, ED: Lactic Acid, Venous: 1.27 mmol/L (ref 0.5–2.0)

## 2014-11-29 LAB — BRAIN NATRIURETIC PEPTIDE: B Natriuretic Peptide: 558.7 pg/mL — ABNORMAL HIGH (ref 0.0–100.0)

## 2014-11-29 LAB — STREP PNEUMONIAE URINARY ANTIGEN: Strep Pneumo Urinary Antigen: NEGATIVE

## 2014-11-29 MED ORDER — AMLODIPINE BESYLATE 10 MG PO TABS
10.0000 mg | ORAL_TABLET | Freq: Every day | ORAL | Status: DC
  Administered 2014-11-30 – 2014-12-03 (×4): 10 mg via ORAL
  Filled 2014-11-29 (×5): qty 1

## 2014-11-29 MED ORDER — WARFARIN SODIUM 5 MG PO TABS
5.0000 mg | ORAL_TABLET | Freq: Every day | ORAL | Status: AC
  Administered 2014-11-29: 5 mg via ORAL
  Filled 2014-11-29: qty 1

## 2014-11-29 MED ORDER — TIOTROPIUM BROMIDE MONOHYDRATE 18 MCG IN CAPS
1.0000 | ORAL_CAPSULE | Freq: Every day | RESPIRATORY_TRACT | Status: DC
  Administered 2014-11-30 – 2014-12-03 (×3): 18 ug via RESPIRATORY_TRACT
  Filled 2014-11-29: qty 5

## 2014-11-29 MED ORDER — LEVOFLOXACIN IN D5W 750 MG/150ML IV SOLN: 750.0000 mg | INTRAVENOUS | Status: DC

## 2014-11-29 MED ORDER — ALBUTEROL SULFATE (2.5 MG/3ML) 0.083% IN NEBU
2.5000 mg | INHALATION_SOLUTION | RESPIRATORY_TRACT | Status: DC | PRN
  Administered 2014-11-30 – 2014-12-02 (×3): 2.5 mg via RESPIRATORY_TRACT
  Filled 2014-11-29 (×3): qty 3

## 2014-11-29 MED ORDER — HYDROCODONE-ACETAMINOPHEN 5-325 MG PO TABS
1.0000 | ORAL_TABLET | Freq: Once | ORAL | Status: AC
  Administered 2014-11-29: 1 via ORAL
  Filled 2014-11-29: qty 1

## 2014-11-29 MED ORDER — ALBUTEROL SULFATE (2.5 MG/3ML) 0.083% IN NEBU
5.0000 mg | INHALATION_SOLUTION | Freq: Once | RESPIRATORY_TRACT | Status: AC
  Administered 2014-11-29: 5 mg via RESPIRATORY_TRACT
  Filled 2014-11-29: qty 6

## 2014-11-29 MED ORDER — AZITHROMYCIN 500 MG PO TABS: 500.0000 mg | ORAL_TABLET | ORAL | Status: DC

## 2014-11-29 MED ORDER — PIPERACILLIN-TAZOBACTAM 3.375 G IVPB
3.3750 g | Freq: Three times a day (TID) | INTRAVENOUS | Status: DC
  Administered 2014-11-30 – 2014-12-01 (×5): 3.375 g via INTRAVENOUS
  Filled 2014-11-29 (×7): qty 50

## 2014-11-29 MED ORDER — AZITHROMYCIN 500 MG IV SOLR
500.0000 mg | Freq: Once | INTRAVENOUS | Status: AC
  Administered 2014-11-29: 500 mg via INTRAVENOUS
  Filled 2014-11-29: qty 500

## 2014-11-29 MED ORDER — PANTOPRAZOLE SODIUM 40 MG PO TBEC
40.0000 mg | DELAYED_RELEASE_TABLET | Freq: Every day | ORAL | Status: DC
  Administered 2014-11-30 – 2014-12-03 (×4): 40 mg via ORAL
  Filled 2014-11-29 (×4): qty 1

## 2014-11-29 MED ORDER — MORPHINE SULFATE 4 MG/ML IJ SOLN
4.0000 mg | Freq: Once | INTRAMUSCULAR | Status: AC
  Administered 2014-11-29: 4 mg via INTRAVENOUS
  Filled 2014-11-29: qty 1

## 2014-11-29 MED ORDER — IPRATROPIUM BROMIDE 0.02 % IN SOLN
0.5000 mg | Freq: Once | RESPIRATORY_TRACT | Status: AC
  Administered 2014-11-29: 0.5 mg via RESPIRATORY_TRACT
  Filled 2014-11-29: qty 2.5

## 2014-11-29 MED ORDER — POTASSIUM CHLORIDE CRYS ER 20 MEQ PO TBCR
20.0000 meq | EXTENDED_RELEASE_TABLET | Freq: Every day | ORAL | Status: DC
Start: 2014-11-30 — End: 2014-11-30
  Filled 2014-11-29: qty 1

## 2014-11-29 MED ORDER — ONDANSETRON HCL 4 MG/2ML IJ SOLN
4.0000 mg | Freq: Once | INTRAMUSCULAR | Status: AC
  Administered 2014-11-29: 4 mg via INTRAVENOUS
  Filled 2014-11-29: qty 2

## 2014-11-29 MED ORDER — HYDROCODONE-ACETAMINOPHEN 5-325 MG PO TABS
1.0000 | ORAL_TABLET | Freq: Four times a day (QID) | ORAL | Status: DC | PRN
Start: 2014-11-29 — End: 2014-11-30
  Administered 2014-11-30: 1 via ORAL
  Filled 2014-11-29: qty 1

## 2014-11-29 MED ORDER — CEFTRIAXONE SODIUM 1 G IJ SOLR
1.0000 g | Freq: Once | INTRAMUSCULAR | Status: AC
  Administered 2014-11-29: 1 g via INTRAVENOUS
  Filled 2014-11-29: qty 10

## 2014-11-29 MED ORDER — ATORVASTATIN CALCIUM 80 MG PO TABS
80.0000 mg | ORAL_TABLET | Freq: Every day | ORAL | Status: DC
  Administered 2014-11-29 – 2014-12-02 (×4): 80 mg via ORAL
  Filled 2014-11-29 (×5): qty 1

## 2014-11-29 MED ORDER — ACETAMINOPHEN 325 MG PO TABS
650.0000 mg | ORAL_TABLET | Freq: Once | ORAL | Status: AC
  Administered 2014-11-29: 650 mg via ORAL
  Filled 2014-11-29: qty 2

## 2014-11-29 MED ORDER — FUROSEMIDE 40 MG PO TABS
60.0000 mg | ORAL_TABLET | Freq: Two times a day (BID) | ORAL | Status: DC
  Administered 2014-11-29: 60 mg via ORAL
  Filled 2014-11-29 (×4): qty 1

## 2014-11-29 MED ORDER — WARFARIN - PHARMACIST DOSING INPATIENT
Freq: Every day | Status: DC
  Administered 2014-11-29 – 2014-11-30 (×2)

## 2014-11-29 MED ORDER — BISOPROLOL FUMARATE 5 MG PO TABS: 5.0000 mg | ORAL_TABLET | Freq: Every day | ORAL | Status: DC

## 2014-11-29 MED ORDER — PANTOPRAZOLE SODIUM 20 MG PO TBEC
20.0000 mg | DELAYED_RELEASE_TABLET | Freq: Once | ORAL | Status: AC
Start: 2014-11-29 — End: 2014-11-29
  Administered 2014-11-29: 20 mg via ORAL
  Filled 2014-11-29: qty 1

## 2014-11-29 MED ORDER — LEVOFLOXACIN IN D5W 750 MG/150ML IV SOLN
750.0000 mg | INTRAVENOUS | Status: DC
  Filled 2014-11-29: qty 150

## 2014-11-29 MED ORDER — CEFTRIAXONE SODIUM IN DEXTROSE 20 MG/ML IV SOLN: 1.0000 g | INTRAVENOUS | Status: DC

## 2014-11-29 NOTE — ED Notes (Signed)
Respiratory tech at bedside with patient.

## 2014-11-29 NOTE — Progress Notes (Signed)
ANTIBIOTIC CONSULT NOTE - INITIAL  Pharmacy Consult for Zosyn  Indication: Bacteremia, PNA  Allergies  Allergen Reactions  . Niaspan [Niacin Er] Hives    Patient Measurements: Height: 5\' 7"  (170.2 cm) Weight: 185 lb 12.8 oz (84.278 kg) IBW/kg (Calculated) : 66.1  Vital Signs: Temp: 97.9 F (36.6 C) (07/08 2038) Temp Source: Oral (07/08 2038) BP: 122/74 mmHg (07/08 2038) Pulse Rate: 71 (07/08 2038)  Labs:  Recent Labs  11/29/14 0904  WBC 10.6*  HGB 14.4  PLT 211  CREATININE 2.42*   Estimated Creatinine Clearance: 30.3 mL/min (by C-G formula based on Cr of 2.42).  Microbiology: Recent Results (from the past 720 hour(s))  Culture, blood (routine x 2)     Status: None (Preliminary result)   Collection Time: 11/29/14 10:00 AM  Result Value Ref Range Status   Specimen Description BLOOD LEFT ANTECUBITAL  Final   Special Requests BOTTLES DRAWN AEROBIC AND ANAEROBIC 10CC  Final   Culture  Setup Time   Final    GRAM NEGATIVE RODS IN BOTH AEROBIC AND ANAEROBIC BOTTLES CRITICAL RESULT CALLED TO, READ BACK BY AND VERIFIED WITH: Lowella Bandy 07.08.16 2226 M SHIPMAN CONFIRMED BY A BROWNING    Culture PENDING  Incomplete   Report Status PENDING  Incomplete  Culture, blood (routine x 2)     Status: None (Preliminary result)   Collection Time: 11/29/14 10:05 AM  Result Value Ref Range Status   Specimen Description BLOOD RIGHT ANTECUBITAL  Final   Special Requests BOTTLES DRAWN AEROBIC AND ANAEROBIC 5CC  Final   Culture  Setup Time   Final    GRAM NEGATIVE RODS IN BOTH AEROBIC AND ANAEROBIC BOTTLES CRITICAL RESULT CALLED TO, READ BACK BY AND VERIFIED WITH: Lowella Bandy 07.08.16 2226 M SHIPMAN CONFIRMED BY A BROWNING    Culture PENDING  Incomplete   Report Status PENDING  Incomplete  MRSA PCR Screening     Status: None   Collection Time: 11/29/14 12:27 PM  Result Value Ref Range Status   MRSA by PCR NEGATIVE NEGATIVE Final    Comment:        The GeneXpert MRSA Assay  (FDA approved for NASAL specimens only), is one component of a comprehensive MRSA colonization surveillance program. It is not intended to diagnose MRSA infection nor to guide or monitor treatment for MRSA infections.     Medical History: Past Medical History  Diagnosis Date  . CAD (coronary artery disease)     myoview 04/21/11-normal, EF49%  . S/P CABG (coronary artery bypass graft) 1996  . Hyperlipemia   . Polycythemia     H/O  . GERD (gastroesophageal reflux disease)   . Tobacco abuse   . CKD (chronic kidney disease), stage III   . Myocardial infarction 1996  . OSA on CPAP     CPAP q night , CPAP is through [place], states doesn't remember when he had the last study  . Pneumonia     hosp. as a child with pneumonia, not since   . Diabetes     BORDERLINE  . Headache     uses [employer], states he is lessening what he is taking for prep. for surgery   . Arthritis     in his back  . Hypertension     stress test- done 04/2014, followed by Dr. Gwenlyn Found  . Carotid stenosis 01/15/08    right endarterectomy-Dr. Amedeo Plenty  . PAD (peripheral artery disease) 06/12/09    a. 11/22/07 PTA & stenting right external iliac  artery;bilateral iliac & PTI and stenting 1997;right ICA =>50% reduction,right SFA >50%,left ICA at stent => 50% reduction,left EIA => 50% reduction,left ATA occluded, left SFA mid > 49% reduction;  03/2014 Angio: LRA 90, patent L Iliac stent and distal RCIA stent, large saccular AAA.  [name] Abdominal aortic aneurysm 01/28/10; 02/05/14    4.3x4.6cm;  now measuring 5.5 cm, status post aortobifemoral bypass grafting in January 2016 by Dr. problem along with left renal artery bypass     Assessment: Switching to Zosyn after blood cultures back with gram negative rods, WBC 10.6, noted renal dysfunction, other labs as above.   Plan:  -Zosyn 3.375G IV q8h to be infused over 4 hours -Trend WBC, temp, renal function -F/U blood cultures  Narda Bonds 11/29/2014,11:24 PM

## 2014-11-29 NOTE — ED Notes (Signed)
Dr. Maryan Rued at bedside with patient and wife.

## 2014-11-29 NOTE — ED Provider Notes (Addendum)
CSN: [id number]     Arrival date & time 11/29/14  [id number] History   First MD Initiated Contact with Patient 11/29/14 [phone]     Chief Complaint  Patient presents with  . Shortness of Breath  . Chest Pain     (Consider location/radiation/quality/duration/timing/severity/associated sxs/prior Treatment) HPI Comments: Patient is a 69 year old man with a significant history of coronary artery disease status post CABG, peripheral artery disease and AAA status post aorto iliac grafting in January 2016, chronic renal insufficiency stage IV which may need dialysis soon, diabetes who is still currently smoking presents today with abrupt onset of bilateral flank pain and shortness of breath that woke him from sleep this morning. Patient states he only thing he did different yesterday was do yard work outside. However he denies any change of his medications or missing any medications. He felt normal when he went to bed last night. Patient denies any chest or abdominal tenderness. He has chronic swelling in his legs but does not seem any worse than normal. He also has noted a new cough today which is present if he attempts to take a deep breath. The pain in his back is made worse if he tries to take a deep breath. He sleeps with a BiPAP mask but does not use any inhalers at home. He still makes urine but feels that has been decreased recently from baseline.  Patient is a 69 y.o. male presenting with shortness of breath and chest pain. The history is provided by the patient.  Shortness of Breath Severity:  Severe Onset quality:  Sudden Duration:  3 hours Timing:  Constant Progression:  Worsening Chronicity:  New Context: not URI   Context comment:  Woke up this morning like this Associated symptoms: chest pain, cough and wheezing   Associated symptoms: no fever, no sputum production and no vomiting   Chest Pain Associated symptoms: cough and shortness of breath   Associated symptoms: no fever and not vomiting      Past Medical History  Diagnosis Date  . CAD (coronary artery disease)     myoview 04/21/11-normal, EF49%  . S/P CABG (coronary artery bypass graft) 1996  . Hyperlipemia   . Polycythemia     H/O  . GERD (gastroesophageal reflux disease)   . Tobacco abuse   . CKD (chronic kidney disease), stage III   . Myocardial infarction 1996  . OSA on CPAP     CPAP q night , CPAP is through [place], states doesn't remember when he had the last study  . Pneumonia     hosp. as a child with pneumonia, not since   . Diabetes     BORDERLINE  . Headache     uses [employer], states he is lessening what he is taking for prep. for surgery   . Arthritis     in his back  . Hypertension     stress test- done 04/2014, followed by Dr. Gwenlyn Found  . Carotid stenosis 01/15/08    right endarterectomy-Dr. Amedeo Plenty  . PAD (peripheral artery disease) 06/12/09    a. 11/22/07 PTA & stenting right external iliac artery;bilateral iliac & PTI and stenting 1997;right ICA =>50% reduction,right SFA >50%,left ICA at stent => 50% reduction,left EIA => 50% reduction,left ATA occluded, left SFA mid > 49% reduction;  03/2014 Angio: LRA 90, patent L Iliac stent and distal RCIA stent, large saccular AAA.  [name] Abdominal aortic aneurysm 01/28/10; 02/05/14    4.3x4.6cm;  now measuring 5.5 cm, status post aortobifemoral  bypass grafting in January 2016 by Dr. problem along with left renal artery bypass   Past Surgical History  Procedure Laterality Date  . Carotid endarterectomy Right 01/15/08  . Lumbar disc surgery  1998  . Pv angio  11/22/2007    PTA/ stenting of right common iliac artery with a 10x73mm Smart stent and postdilated with a 7x30mmpowerflex balloon; high grade calcified stenosis of the RICA  . Colonoscopy N/A 07/09/2013    Procedure: COLONOSCOPY;  Surgeon: Juanita Craver, MD;  Location: [facility];  Service: Endoscopy;  Laterality: N/A;  . Pv angio  04/01/14    AAA, Lt renal artery stenosis, patent iliacs  . Abdominal aortagram N/A  04/01/2014    Procedure: ABDOMINAL Maxcine Ham;  Surgeon: [name], MD;  Location: [facility] CATH LAB;  Service: Cardiovascular;  Laterality: N/A;  . Back surgery  1988    at [facility]  . Coronary artery bypass graft  1996    LIMA to LAD, sequential vein to OM1 and 2 as well as acure marginal branch and diag branch  . Aorta - bilateral femoral artery bypass graft N/A 05/30/2014    Procedure: AORTOBIFEMORAL BYPASS GRAFT;  Surgeon: Serafina Mitchell, MD;  Location: [facility];  Service: Vascular;  Laterality: N/A;  . Aortic/renal bypass Left 05/30/2014    Procedure: LEFT RENAL ARTERY BYPASS;  Surgeon: Serafina Mitchell, MD;  Location: [facility] OR;  Service: Vascular;  Laterality: Left;   Family History  Problem Relation Age of Onset  . Heart failure Father    History  Substance Use Topics  . Smoking status: Current Every Day Smoker -- 1.00 packs/day for 56 years    Types: Cigarettes  . Smokeless tobacco: Never Used  . Alcohol Use: No    Review of Systems  Constitutional: Negative for fever.  Respiratory: Positive for cough, shortness of breath and wheezing. Negative for sputum production.   Cardiovascular: Positive for chest pain.  Gastrointestinal: Negative for vomiting.  All other systems reviewed and are negative.     Allergies  Niaspan  Home Medications   Prior to Admission medications   Medication Sig Start Date End Date Taking? Authorizing Provider  amLODipine (NORVASC) 10 MG tablet Take 10 mg by mouth daily before breakfast.     Historical Provider, MD  Aspirin-Acetaminophen-Caffeine (GOODY HEADACHE PO) as needed.    Historical Provider, MD  atorvastatin (LIPITOR) 80 MG tablet Take 80 mg by mouth at bedtime.     Historical Provider, MD  bisoprolol (ZEBETA) 5 MG tablet Take 5 mg by mouth daily. 11/11/14   Historical Provider, MD  Cholecalciferol (VITAMIN D3) 5000 UNITS CAPS Take 5,000 Units by mouth daily.    Historical Provider, MD  furosemide (LASIX) 40 MG tablet Take by mouth 2 (two) times  daily. 60 mg q am and 40 mg q pm    Historical Provider, MD  omeprazole (PRILOSEC) 20 MG capsule Take 20 mg by mouth at bedtime.     Historical Provider, MD  potassium chloride SA (K-DUR,KLOR-CON) 20 MEQ tablet Take 20 mEq by mouth daily.    Historical Provider, MD  Tiotropium Bromide Monohydrate (SPIRIVA RESPIMAT) 2.5 MCG/ACT AERS Inhale 2 puffs into the lungs daily. 11/11/14   Tanda Rockers, MD  warfarin (COUMADIN) 5 MG tablet Take 5 mg by mouth one time only at 6 PM.    Historical Provider, MD   BP 154/75 mmHg  Pulse 87  Temp(Src) 98.7 F (37.1 C) (Oral)  Resp 38  Ht 5\' 7"  (1.702 m)  SpO2 100% Physical Exam  Constitutional: He is oriented to person, place, and time. He appears well-developed and well-nourished. No distress.  HENT:  Head: Normocephalic and atraumatic.  Mouth/Throat: Oropharynx is clear and moist. Mucous membranes are dry.  Eyes: Conjunctivae and EOM are normal. Pupils are equal, round, and reactive to light.  Neck: Normal range of motion. Neck supple.  Cardiovascular: Normal rate, regular rhythm and intact distal pulses.   No murmur heard. Pulmonary/Chest: Effort normal. Tachypnea noted. No respiratory distress. He has wheezes. He has rales.  Wheezing in the upper lobes and rales diffusely more pronounced in the lower lobes  Abdominal: Soft. He exhibits no distension. There is no tenderness. There is no rebound and no guarding.  Well-healed midline surgical abdominal scar. No notable abdominal tenderness. Mild reproducible CVA tenderness bilaterally left greater than right.  Musculoskeletal: Normal range of motion. He exhibits edema. He exhibits no tenderness.  3+ pitting edema bilaterally up to the knee. No midline lumbar tenderness  Neurological: He is alert and oriented to person, place, and time.  Skin: Skin is warm and dry. No rash noted. No erythema.  Psychiatric: He has a normal mood and affect. His behavior is normal.  Nursing note and vitals  reviewed.   ED Course  Procedures (including critical care time) Labs Review Labs Reviewed  CBC WITH DIFFERENTIAL/PLATELET - Abnormal; Notable for the following:    WBC 10.6 (*)    RDW 16.3 (*)    Neutrophils Relative % 81 (*)    Neutro Abs 8.6 (*)    All other components within normal limits  COMPREHENSIVE METABOLIC PANEL - Abnormal; Notable for the following:    Glucose, Bld 128 (*)    BUN 45 (*)    Creatinine, Ser 2.42 (*)    Calcium 8.8 (*)    Total Protein 6.2 (*)    Albumin 3.4 (*)    ALT 16 (*)    GFR calc non Af Amer 26 (*)    GFR calc Af Amer 30 (*)    All other components within normal limits  BRAIN NATRIURETIC PEPTIDE - Abnormal; Notable for the following:    B Natriuretic Peptide 558.7 (*)    All other components within normal limits  I-STAT ARTERIAL BLOOD GAS, ED - Abnormal; Notable for the following:    pH, Arterial 7.283 (*)    pCO2 arterial 47.1 (*)    Acid-base deficit 5.0 (*)    All other components within normal limits  CULTURE, BLOOD (ROUTINE X 2)  CULTURE, BLOOD (ROUTINE X 2)  URINALYSIS, ROUTINE W REFLEX MICROSCOPIC (NOT AT [facility])  PROTIME-INR  I-STAT TROPOININ, ED  I-STAT CG4 LACTIC ACID, ED    Imaging Review Dg Chest Port 1 View  11/29/2014   CLINICAL DATA:  Shortness of Breath  EXAM: PORTABLE CHEST - 1 VIEW  COMPARISON:  10/06/2014 and 06/19/2014  FINDINGS: Cardiomegaly again noted. Status post CABG. Mild interstitial prominence bilaterally suspicious for mild interstitial edema. Hazy right basilar atelectasis or infiltrate.  IMPRESSION: Mild interstitial prominence bilaterally suspicious for mild interstitial edema. Status post CABG. Cardiomegaly. Hazy right basilar atelectasis or infiltrate.   Electronically Signed   By: Lahoma Crocker M.D.   On: 11/29/2014 09:19     EKG Interpretation   Date/Time:  Friday November 29 2014 08:42:08 EDT Ventricular Rate:  86 PR Interval:    QRS Duration: 128 QT Interval:  392 QTC Calculation: 469 R Axis:   0 Text  Interpretation:  Atrial fibrillation Right bundle branch  block LVH  with IVCD and secondary repol abnrm Abnormal inferior Q waves No  significant change since last tracing Confirmed by Maryan Rued  MD, Loree Fee  [phone]) on 11/29/2014 8:49:59 AM      MDM   Final diagnoses:  SOB (shortness of breath)  CAP (community acquired pneumonia)    Patient with significant medical history for chronic renal insufficiency, coronary artery disease status post CABG, diabetes, hypertension, peripheral artery disease, AAA status post aorto iliac grafting in January who presents today with bilateral flank pain that woke him up this morning and shortness of breath.  Patient is tachypnea here, wheezing and rales present with distal edema. He has no abdominal pain and his surgical wound is well-healed. Patient denies any chest pain and EKG is unchanged from prior.  Patient denies any infectious symptoms but states he does cough when taking deep breaths. Initially when the fire department arrived patient was satting in the 58s.  Patient does admit to doing yard work yesterday but can't think of anything else out of the work very. He is still taking all of his medications and has not missed any doses. EMS gave patient Solu-Medrol and a DuoNeb in route which she states did slightly improve his symptoms.  Concern for fluid overload versus bilateral pyelonephritis versus pneumonia versus PE versus atypical cardiac disease versus graft complications versus COPD exacerbation. Patient is currently smoking.  Patient given a second DuoNeb. Chest x-ray, CBC, CMP, troponin, BNP, UA, lactic acid pending. Patient given morphine for pain.  9:37 AM Pt with fever of 101.4, leukocytosis and normal lactate.  CXR with concern for some volume overload as well as concern for PNA.  Will treat with rocephin and azithromycin.  Will admit for further care.  Pt placed on bipap for his tachypnea also because he has baseline fluid overload for his  renal disease.  10:21 AM ABG with mild respiratory acidosis but CO2 appears to be at baseline. On reevaluation patient feels his breathing is a little bit better. Will hold on BiPAP at this time  CRITICAL CARE Performed by: Blanchie Dessert Total critical care time: 30 Critical care time was exclusive of separately billable procedures and treating other patients. Critical care was necessary to treat or prevent imminent or life-threatening deterioration. Critical care was time spent personally by me on the following activities: development of treatment plan with patient and/or surrogate as well as nursing, discussions with consultants, evaluation of patient's response to treatment, examination of patient, obtaining history from patient or surrogate, ordering and performing treatments and interventions, ordering and review of laboratory studies, ordering and review of radiographic studies, pulse oximetry and re-evaluation of patient's condition.   Blanchie Dessert, MD 11/29/14 1021  Blanchie Dessert, MD 11/29/14 1021

## 2014-11-29 NOTE — ED Notes (Addendum)
Respiratory at bedside with patient.

## 2014-11-29 NOTE — Progress Notes (Signed)
Pt states he doesn't want to wear cpap tonight although he does wear it at home. Encouraged pt to call if he changed his mind; he communicates understanding.

## 2014-11-29 NOTE — Progress Notes (Signed)
CRITICAL VALUE ALERT  Critical value received:  Blood culture = gram negative rods  Date of notification:  11/29/14  Time of notification:  2228  Critical value read back: YES  Nurse who received alert:  Sherryl Manges, RN, BSN  MD notified (1st page):  IMTS  Time of first page:  2257  MD notified (2nd page):  Time of second page:  Responding MD:    Time MD responded:

## 2014-11-29 NOTE — H&P (Signed)
Date: 11/29/2014               Patient Name:  Barry Lawrence MRN: [id number]  DOB: 1945-09-01 Age / Sex: 69 y.o., male   PCP: Christain Sacramento, MD         Medical Service: Internal Medicine Teaching Service         Attending Physician: Dr. Aldine Contes, MD    First Contact: Dr. Maryellen Pile Pager: [phone]  Second Contact: Dr. Bing Neighbors Pager: [phone]       After Hours (After 5p/  First Contact Pager: [phone]  weekends / holidays): Second Contact Pager: [phone]   Chief Complaint: SOB  History of Present Illness: Barry Lawrence is a 69 year old male with PMH significant for CKD Stage 4, A fib, CAD, PAD, AAA s/p aortobifemoral bypass in Jan 2016.  Who presents to [facility] today with complaints of shortness of breath.  He notes that yesterday he started feeling a little unwell and a bit nauseous when he was mowing his yard yesterday.  When he went to bed last night he was starting ot have some bilateral flank pain and this morning he woke feeling very short of breath with chills.  He notes that it was very difficult for him to take a deep breath because it caused right side flank pain.  He denies any fevers at home.  He denies any prolonged immobilization or calf/leg pain and is compliant with warfarin for A/C (for Afib).  He does report chronic lower extremity swelling and reports that he takes Lasix twice a day.  He has not missed doses of this and reports his weight has been stable at 187-188.  He lives in [place] and recieves most of his care at the [place] in [place].  His PCP is there as well as his nephrologist.  He continues to smoke about a pack a day.  Meds: Current Facility-Administered Medications  Medication Dose Route Frequency Provider Last Rate Last Dose  . albuterol (PROVENTIL) (2.5 MG/3ML) 0.083% nebulizer solution 2.5 mg  2.5 mg Nebulization Q2H PRN Lucious Groves, DO      . HYDROcodone-acetaminophen (NORCO/VICODIN) 5-325 MG per tablet 1 tablet  1 tablet Oral Once  Blanchie Dessert, MD      . warfarin (COUMADIN) tablet 5 mg  5 mg Oral q1800 Rolla Flatten, [facility]      . Warfarin - Pharmacist Dosing Inpatient   Does not apply [place], [facility]       Current Outpatient Prescriptions  Medication Sig Dispense Refill  . amLODipine (NORVASC) 10 MG tablet Take 10 mg by mouth daily before breakfast.     . atorvastatin (LIPITOR) 80 MG tablet Take 80 mg by mouth at bedtime.     . bisoprolol (ZEBETA) 5 MG tablet Take 5 mg by mouth daily.  0  . furosemide (LASIX) 40 MG tablet Take by mouth 2 (two) times daily. 60 mg q am and 40 mg q pm    . omeprazole (PRILOSEC) 20 MG capsule Take 20 mg by mouth at bedtime.     . potassium chloride SA (K-DUR,KLOR-CON) 20 MEQ tablet Take 20 mEq by mouth daily.    . Tiotropium Bromide Monohydrate (SPIRIVA RESPIMAT) 2.5 MCG/ACT AERS Inhale 2 puffs into the lungs daily. 1 Inhaler 5  . warfarin (COUMADIN) 5 MG tablet Take 5 mg by mouth one time only at 6 PM.    . Aspirin-Acetaminophen-Caffeine (GOODY HEADACHE PO) as needed.    [name]  Cholecalciferol (VITAMIN D3) 5000 UNITS CAPS Take 5,000 Units by mouth daily.      Allergies: Allergies as of 11/29/2014 - Review Complete 11/29/2014  Allergen Reaction Noted  . Niaspan [niacin er] Hives 07/15/2012   Past Medical History  Diagnosis Date  . CAD (coronary artery disease)     myoview 04/21/11-normal, EF49%  . S/P CABG (coronary artery bypass graft) 1996  . Hyperlipemia   . Polycythemia     H/O  . GERD (gastroesophageal reflux disease)   . Tobacco abuse   . CKD (chronic kidney disease), stage III   . Myocardial infarction 1996  . OSA on CPAP     CPAP q night , CPAP is through [place], states doesn't remember when he had the last study  . Pneumonia     hosp. as a child with pneumonia, not since   . Diabetes     BORDERLINE  . Headache     uses [employer], states he is lessening what he is taking for prep. for surgery   . Arthritis     in his back  . Hypertension      stress test- done 04/2014, followed by Dr. Gwenlyn Found  . Carotid stenosis 01/15/08    right endarterectomy-Dr. Amedeo Plenty  . PAD (peripheral artery disease) 06/12/09    a. 11/22/07 PTA & stenting right external iliac artery;bilateral iliac & PTI and stenting 1997;right ICA =>50% reduction,right SFA >50%,left ICA at stent => 50% reduction,left EIA => 50% reduction,left ATA occluded, left SFA mid > 49% reduction;  03/2014 Angio: LRA 90, patent L Iliac stent and distal RCIA stent, large saccular AAA.  [name] Abdominal aortic aneurysm 01/28/10; 02/05/14    4.3x4.6cm;  now measuring 5.5 cm, status post aortobifemoral bypass grafting in January 2016 by Dr. problem along with left renal artery bypass   Past Surgical History  Procedure Laterality Date  . Carotid endarterectomy Right 01/15/08  . Lumbar disc surgery  1998  . Pv angio  11/22/2007    PTA/ stenting of right common iliac artery with a 10x32mm Smart stent and postdilated with a 7x53mmpowerflex balloon; high grade calcified stenosis of the RICA  . Colonoscopy N/A 07/09/2013    Procedure: COLONOSCOPY;  Surgeon: Juanita Craver, MD;  Location: [facility] ENDOSCOPY;  Service: Endoscopy;  Laterality: N/A;  . Pv angio  04/01/14    AAA, Lt renal artery stenosis, patent iliacs  . Abdominal aortagram N/A 04/01/2014    Procedure: ABDOMINAL Maxcine Ham;  Surgeon: [name], MD;  Location: [facility] CATH LAB;  Service: Cardiovascular;  Laterality: N/A;  . Back surgery  1988    at [facility]  . Coronary artery bypass graft  1996    LIMA to LAD, sequential vein to OM1 and 2 as well as acure marginal branch and diag branch  . Aorta - bilateral femoral artery bypass graft N/A 05/30/2014    Procedure: AORTOBIFEMORAL BYPASS GRAFT;  Surgeon: Serafina Mitchell, MD;  Location: [facility];  Service: Vascular;  Laterality: N/A;  . Aortic/renal bypass Left 05/30/2014    Procedure: LEFT RENAL ARTERY BYPASS;  Surgeon: Serafina Mitchell, MD;  Location: [facility] OR;  Service: Vascular;  Laterality: Left;   Family History   Problem Relation Age of Onset  . Heart failure Father    History   Social History  . Marital Status: Married    Spouse Name: N/A  . Number of Children: 2  . Years of Education: N/A   Occupational History  . Retired Estate manager/land agent  Social History Main Topics  . Smoking status: Current Every Day Smoker -- 1.00 packs/day for 56 years    Types: Cigarettes  . Smokeless tobacco: Never Used  . Alcohol Use: No  . Drug Use: No  . Sexual Activity: Not on file   Other Topics Concern  . Not on file   Social History Narrative   Lives with wife.    Review of Systems: Review of Systems  Constitutional: Positive for chills and malaise/fatigue. Negative for fever and weight loss.  Eyes: Negative for blurred vision.  Respiratory: Positive for cough and shortness of breath. Negative for sputum production and wheezing.   Cardiovascular: Negative for chest pain.  Gastrointestinal: Positive for nausea. Negative for vomiting and abdominal pain.  Genitourinary: Negative for dysuria and frequency.  Musculoskeletal: Negative for myalgias.  Skin: Negative for rash.  Neurological: Positive for dizziness. Negative for headaches.     Physical Exam: Blood pressure 103/50, pulse 80, temperature 101.4 F (38.6 C), temperature source Rectal, resp. rate 24, height 5\' 7"  (1.702 m), SpO2 93 %. Physical Exam  Constitutional: He is oriented to person, place, and time and well-developed, well-nourished, and in no distress.  HENT:  Head: Normocephalic and atraumatic.  Minimal JVD  Neck: Neck supple.  Cardiovascular: An irregularly irregular rhythm present.  No murmur heard. Pulses:      Dorsalis pedis pulses are 1+ on the right side, and 1+ on the left side.       Posterior tibial pulses are 1+ on the right side, and 1+ on the left side.  Diminished dp and pt pulses bilaterally, toes warm  Pulmonary/Chest: Effort normal. No respiratory distress. He has no wheezes. He has rales (bibasilar). He  exhibits no tenderness.  Abdominal: Soft. Bowel sounds are normal. There is no tenderness. There is no rebound and no guarding.  No CVA tenderness  Musculoskeletal: He exhibits edema (2-3+ below the knee bilaterally).  Neurological: He is alert and oriented to person, place, and time.  Skin: Skin is warm and dry.  Nursing note and vitals reviewed.    Lab results: Basic Metabolic Panel:  Recent Labs  11/29/14 0904  NA 140  K 4.0  CL 106  CO2 24  GLUCOSE 128*  BUN 45*  CREATININE 2.42*  CALCIUM 8.8*   Liver Function Tests:  Recent Labs  11/29/14 0904  AST 17  ALT 16*  ALKPHOS 98  BILITOT 0.6  PROT 6.2*  ALBUMIN 3.4*   No results for input(s): LIPASE, AMYLASE in the last 72 hours. No results for input(s): AMMONIA in the last 72 hours. CBC:  Recent Labs  11/29/14 0904  WBC 10.6*  NEUTROABS 8.6*  HGB 14.4  HCT 43.7  MCV 89.0  PLT 211     Imaging results:  Dg Chest Port 1 View  11/29/2014   CLINICAL DATA:  Shortness of Breath  EXAM: PORTABLE CHEST - 1 VIEW  COMPARISON:  10/06/2014 and 06/19/2014  FINDINGS: Cardiomegaly again noted. Status post CABG. Mild interstitial prominence bilaterally suspicious for mild interstitial edema. Hazy right basilar atelectasis or infiltrate.  IMPRESSION: Mild interstitial prominence bilaterally suspicious for mild interstitial edema. Status post CABG. Cardiomegaly. Hazy right basilar atelectasis or infiltrate.   Electronically Signed   By: Lahoma Crocker M.D.   On: 11/29/2014 09:19    Other results: EKG: A fib with RBBB, no acute changes from EKG in Jan 2016.  Assessment & Plan by Problem:   CAP (community acquired pneumonia) - Patient with shortness of breath,  mild cough, and CXR suggestive of right lower lobe infiltrate. - Given his multiple medical problems he could deteriorate quickly and may need BiPAP, will admit to the Step-down unit. - Blood cultures obtained in ED, will follow - Check HIV, Urine Strep and Legionella -  CAP coverage with Ceftriaxone and Axithromycin    Carotid artery disease- s/p CABG - No active chest pain, initial troponin negative, EKG without any signs of new ischemia. - Continue home medications, do not suspect ACS.    COPD GOLD II still smoking   - No wheezing on my exam, pCO2 47.1 on ABG - Continue spirvia - Goal O2 sat 89-94% - Albuterol PRN    Cigarette smoker -Needs to stop smoking. - RN to provide smoking cessation education    CKD (chronic kidney disease), stage IV - SCr at 2.4 appears improved from recent readings. - Will monitor daily    Sleep apnea - CPAP QHS    Atrial fibrillation - CHADSVasc at least 4 - Continue coumadin per pharmacy -Rate is currently controlled with bisoprolol 5mg  daily    Chronic diastolic CHF (congestive heart failure) - His last echo in 2014 showed Grade 2 DD with normal EF.  He takes Lasix BID for volume management in his CKD4.  He does appear volume overloaded to me given his LE edema.  Further supporting this he does appear to have some evidence of excess fluid on his CXR and his BNP was elevated at 558.  I do not think this is the primary driver for his admission.  Will treat his PNA. - I will continue him on Oral Lasix for now at 60mg  BID (very slight increase from his home 60mg  in AM and 40mg  in PM) with plan for close observation of his fluid status. - His amlodipine may be worsening his LE edema and one may consider a change to a central acting CCB that may help with proteinuria however this may have too much nodal blocking given he is already taking a BB and HR is well controlled. - Strict I&O - Daily weights -Will have BiPAP available if needed. - Day team to reevaluate and consider repeating Echocardiogram  DVTPPx: Coumadin Diet: HH Code Status: Full, confirmed with patient and wife Dispo: Disposition is deferred at this time, awaiting improvement of current medical problems. Anticipated discharge in approximately 2 day(s).    The patient does have a current PCP Christain Sacramento, MD) and does not need an [facility] hospital follow-up appointment after discharge.  The patient does not have transportation limitations that hinder transportation to clinic appointments.  Signed: Lucious Groves, DO 11/29/2014, 11:44 AM

## 2014-11-29 NOTE — Progress Notes (Signed)
ANTICOAGULATION CONSULT NOTE - Initial Consult  Pharmacy Consult for Warfarin Indication: atrial fibrillation  Allergies  Allergen Reactions  . Niaspan [Niacin Er] Hives    Patient Measurements: Height: 5\' 7"  (170.2 cm) IBW/kg (Calculated) : 66.1  Vital Signs: Temp: 101.4 F (38.6 C) (07/08 0927) Temp Source: Rectal (07/08 0927) BP: 103/50 mmHg (07/08 1100) Pulse Rate: 80 (07/08 1100)  Labs:  Recent Labs  11/29/14 0904  HGB 14.4  HCT 43.7  PLT 211  LABPROT 30.3*  INR 2.95*  CREATININE 2.42*    Estimated Creatinine Clearance: 30.5 mL/min (by C-G formula based on Cr of 2.42).   Medical History: Past Medical History  Diagnosis Date  . CAD (coronary artery disease)     myoview 04/21/11-normal, EF49%  . S/P CABG (coronary artery bypass graft) 1996  . Hyperlipemia   . Polycythemia     H/O  . GERD (gastroesophageal reflux disease)   . Tobacco abuse   . CKD (chronic kidney disease), stage III   . Myocardial infarction 1996  . OSA on CPAP     CPAP q night , CPAP is through [place], states doesn't remember when he had the last study  . Pneumonia     hosp. as a child with pneumonia, not since   . Diabetes     BORDERLINE  . Headache     uses [employer], states he is lessening what he is taking for prep. for surgery   . Arthritis     in his back  . Hypertension     stress test- done 04/2014, followed by Dr. Gwenlyn Found  . Carotid stenosis 01/15/08    right endarterectomy-Dr. Amedeo Plenty  . PAD (peripheral artery disease) 06/12/09    a. 11/22/07 PTA & stenting right external iliac artery;bilateral iliac & PTI and stenting 1997;right ICA =>50% reduction,right SFA >50%,left ICA at stent => 50% reduction,left EIA => 50% reduction,left ATA occluded, left SFA mid > 49% reduction;  03/2014 Angio: LRA 90, patent L Iliac stent and distal RCIA stent, large saccular AAA.  [name] Abdominal aortic aneurysm 01/28/10; 02/05/14    4.3x4.6cm;  now measuring 5.5 cm, status post aortobifemoral bypass  grafting in January 2016 by Dr. problem along with left renal artery bypass    Assessment: 35 YOM who presented to the [facility] on 7/8 with SOB, CP, flank pain concerning for PNA - the patient was started on empiric Rocephin + Azithromycin for CAP. The patient was on warfarin PTA for hx Afib - pharmacy has been consulted to resume this during the inpatient stay. Admit INR 2.95 on PTA dose of 5 mg daily. Last dose PTA was on 7/8, baseline CBC okay, no s/sx of bleeding noted at this time. Will resume the patient's home dose for now.   Goal of Therapy:  INR 2-3   Plan:  1. Resume warfarin 5 mg daily 2. Daily PT/INR 3. Will continue to monitor for any signs/symptoms of bleeding and will follow up with PT/INR in the a.m.   Alycia Rossetti, PharmD, BCPS Clinical Pharmacist Pager: [phone] 11/29/2014 11:24 AM

## 2014-11-29 NOTE — ED Notes (Addendum)
EMS - Patient coming from home with c/o of SOB, chest pain, lower back / flank bilateral pain since this morning.  Patient on a non-rebreather with respirations of 34 and wheezing.  Gi en 125mg  Solumedrol with EMS and Duoneb.  Initilly with fire, patient was in the 80's with O2.

## 2014-11-30 DIAGNOSIS — J189 Pneumonia, unspecified organism: Secondary | ICD-10-CM

## 2014-11-30 DIAGNOSIS — A419 Sepsis, unspecified organism: Secondary | ICD-10-CM

## 2014-11-30 LAB — BASIC METABOLIC PANEL
Anion gap: 12 (ref 5–15)
BUN: 55 mg/dL — ABNORMAL HIGH (ref 6–20)
CO2: 26 mmol/L (ref 22–32)
Calcium: 9.3 mg/dL (ref 8.9–10.3)
Chloride: 94 mmol/L — ABNORMAL LOW (ref 101–111)
Creatinine, Ser: 3.18 mg/dL — ABNORMAL HIGH (ref 0.61–1.24)
GFR calc Af Amer: 22 mL/min — ABNORMAL LOW (ref >60)
GFR calc non Af Amer: 19 mL/min — ABNORMAL LOW (ref >60)
Glucose, Bld: 151 mg/dL — ABNORMAL HIGH (ref 65–99)
Potassium: 5.1 mmol/L (ref 3.5–5.1)
Sodium: 132 mmol/L — ABNORMAL LOW (ref 135–145)

## 2014-11-30 LAB — CBC
HCT: 44 % (ref 39.0–52.0)
Hemoglobin: 14.2 g/dL (ref 13.0–17.0)
MCH: 28.8 pg (ref 26.0–34.0)
MCHC: 32.3 g/dL (ref 30.0–36.0)
MCV: 89.2 fL (ref 78.0–100.0)
Platelets: 219 10*3/uL (ref 150–400)
RBC: 4.93 MIL/uL (ref 4.22–5.81)
RDW: 16.1 % — ABNORMAL HIGH (ref 11.5–15.5)
WBC: 18.3 10*3/uL — ABNORMAL HIGH (ref 4.0–10.5)

## 2014-11-30 LAB — HIV ANTIBODY (ROUTINE TESTING W REFLEX): HIV Screen 4th Generation wRfx: NONREACTIVE

## 2014-11-30 LAB — PROTIME-INR
INR: 3.43 — ABNORMAL HIGH (ref 0.00–1.49)
Prothrombin Time: 33.9 seconds — ABNORMAL HIGH (ref 11.6–15.2)

## 2014-11-30 LAB — LACTIC ACID, PLASMA: Lactic Acid, Venous: 1.6 mmol/L (ref 0.5–2.0)

## 2014-11-30 MED ORDER — HYDROCODONE-ACETAMINOPHEN 5-325 MG PO TABS
1.0000 | ORAL_TABLET | ORAL | Status: DC | PRN
Start: 2014-11-30 — End: 2014-12-03
  Administered 2014-11-30 – 2014-12-02 (×6): 1 via ORAL
  Filled 2014-11-30 (×6): qty 1

## 2014-11-30 MED ORDER — SODIUM CHLORIDE 0.9 % IV SOLN
INTRAVENOUS | Status: DC
  Administered 2014-11-30: 08:00:00 via INTRAVENOUS

## 2014-11-30 MED ORDER — WARFARIN SODIUM 1 MG PO TABS
1.0000 mg | ORAL_TABLET | Freq: Once | ORAL | Status: AC
  Administered 2014-11-30: 1 mg via ORAL
  Filled 2014-11-30: qty 1

## 2014-11-30 MED ORDER — GUAIFENESIN-DM 100-10 MG/5ML PO SYRP
5.0000 mL | ORAL_SOLUTION | Freq: Three times a day (TID) | ORAL | Status: DC
  Administered 2014-11-30 – 2014-12-03 (×10): 5 mL via ORAL
  Filled 2014-11-30 (×16): qty 5

## 2014-11-30 NOTE — Progress Notes (Signed)
SATURATION QUALIFICATIONS: (This note is used to comply with regulatory documentation for home oxygen)  Patient Saturations on Room Air at Rest = 97%  Patient Saturations on Room Air while Ambulating = 92%  Patient saturations on Room Air while ascending and descending stairs = 82-87%  Patient Saturations on 2 Liters of oxygen while Ambulating up and down steps = 90-92%  Please briefly explain why patient needs home oxygen:Pt may need O2 with exertional activities. Thanks. Ward [phone] (pager)

## 2014-11-30 NOTE — Progress Notes (Signed)
Patient seen and examined. Case d/w residents in detail.  HPI: 69 y/o male with PMH of CKD stage 4, CAD, PAD, AAA, s/p aortobifemoral bypass in Jan 2016 who p/w SOB * 1 day. Patient states that he was feeling nauseous and slightly unwell the day PTA and woke up yesterday with SOB. It was assoc with b/l flank pain worse on the right and chills. Pain was worse on coughing and taking a deep breath. He denied palpitations or diaphoresis/ no n/v, no dysuria, no increased urinary frequency. He does complain of LE swelling and is compliant with his lasix.   Physical Exam: Gen: AAO*3, NAD CVS: irregularly irregular Chest: bibasilar crackles + Abd: soft, non tender, BS + Ext: 2 + b/l LE pitting edema  Assessment and Plan: 69 y/o male with chills, flank pain secondary to likely sepsis from RLL PNA and gram negative bacteremia. Uncertain etiology of gram negative bacteremia- unlikely etiology of PNA and patient with no evidence of aspiration. Will f/u speciation. May need CT abd/pelvis to clarify source of bacteremia is species is localized only to the gut. Continue with zosyn for now. F/u urine cx.  Patient also with CKD and mild fluid overload. Would hold lasix for now given bacteremia and sepsis and will monitor closely. Cr is at baseline but patient with low urine output.

## 2014-11-30 NOTE — Progress Notes (Signed)
Subjective: Patient is sitting up in bed eating breakfast. He reports he has continued right flank pain and a non-productive cough. No SOB or wheezing. States his leg swelling is worse and is concerned about his kidney function and not getting lasix.   Objective: Vital signs in last 24 hours: Filed Vitals:   11/29/14 2358 11/30/14 0437 11/30/14 0500 11/30/14 0804  BP: 130/70 104/57  140/74  Pulse: 66 74    Temp: 98.1 F (36.7 C) 98 F (36.7 C)    TempSrc: Oral Oral    Resp: 19 20    Height:      Weight:   86 kg (189 lb 9.5 oz)   SpO2: 94% 95%     Weight change:   Intake/Output Summary (Last 24 hours) at 11/30/14 [id number] Last data filed at 11/30/14 0203  Gross per 24 hour  Intake    240 ml  Output    550 ml  Net   -310 ml   Physical Exam: GENERAL- alert, co-operative, appears as stated age, not in any distress. CARDIAC- irregularly irregularly rhythm, no murmurs, rubs or gallops. RESP- Moving equal volumes of air, bibasilar crackles, no wheezing. No respiratory distress. ABDOMEN- Soft, nontender, no guarding or rebound, bowel sounds present. EXTREMITIES- diminished LE pulses 1+ bilaterally, 2+ pitting edema below the knee bilaterally SKIN- Warm, dry, No rash or lesion. PSYCH- Normal mood and affect, appropriate thought content and speech.  Lab Results: Basic Metabolic Panel:  Recent Labs Lab 11/29/14 0904 11/30/14 0221  NA 140 132*  K 4.0 5.1  CL 106 94*  CO2 24 26  GLUCOSE 128* 151*  BUN 45* 55*  CREATININE 2.42* 3.18*  CALCIUM 8.8* 9.3   Liver Function Tests:  Recent Labs Lab 11/29/14 0904  AST 17  ALT 16*  ALKPHOS 98  BILITOT 0.6  PROT 6.2*  ALBUMIN 3.4*   CBC:  Recent Labs Lab 11/29/14 0904 11/30/14 0221  WBC 10.6* 18.3*  NEUTROABS 8.6*  --   HGB 14.4 14.2  HCT 43.7 44.0  MCV 89.0 89.2  PLT 211 219   Coagulation:  Recent Labs Lab 11/29/14 0904 11/30/14 0221  LABPROT 30.3* 33.9*  INR 2.95* 3.43*   Urinalysis:  Recent Labs Lab  11/29/14 1025  COLORURINE YELLOW  LABSPEC 1.016  PHURINE 5.0  GLUCOSEU NEGATIVE  HGBUR SMALL*  BILIRUBINUR NEGATIVE  KETONESUR NEGATIVE  PROTEINUR >300*  UROBILINOGEN 0.2  NITRITE NEGATIVE  LEUKOCYTESUR NEGATIVE   Micro Results: Recent Results (from the past 240 hour(s))  Culture, blood (routine x 2)     Status: None (Preliminary result)   Collection Time: 11/29/14 10:00 AM  Result Value Ref Range Status   Specimen Description BLOOD LEFT ANTECUBITAL  Final   Special Requests BOTTLES DRAWN AEROBIC AND ANAEROBIC 10CC  Final   Culture  Setup Time   Final    GRAM NEGATIVE RODS IN BOTH AEROBIC AND ANAEROBIC BOTTLES CRITICAL RESULT CALLED TO, READ BACK BY AND VERIFIED WITH: Lowella Bandy 07.08.16 2[address] [facility] CONFIRMED BY A BROWNING    Culture PENDING  Incomplete   Report Status PENDING  Incomplete  Culture, blood (routine x 2)     Status: None (Preliminary result)   Collection Time: 11/29/14 10:05 AM  Result Value Ref Range Status   Specimen Description BLOOD RIGHT ANTECUBITAL  Final   Special Requests BOTTLES DRAWN AEROBIC AND ANAEROBIC 5CC  Final   Culture  Setup Time   Final    GRAM NEGATIVE RODS IN BOTH AEROBIC AND  ANAEROBIC BOTTLES CRITICAL RESULT CALLED TO, READ BACK BY AND VERIFIED WITH: Lowella Bandy 07.08.16 2226 [facility] CONFIRMED BY A BROWNING    Culture PENDING  Incomplete   Report Status PENDING  Incomplete  MRSA PCR Screening     Status: None   Collection Time: 11/29/14 12:27 PM  Result Value Ref Range Status   MRSA by PCR NEGATIVE NEGATIVE Final    Comment:        The GeneXpert MRSA Assay (FDA approved for NASAL specimens only), is one component of a comprehensive MRSA colonization surveillance program. It is not intended to diagnose MRSA infection nor to guide or monitor treatment for MRSA infections.    Studies/Results: Dg Chest 2 View  11/29/2014   CLINICAL DATA:  Shortness of breath and chest pain.  EXAM: CHEST  2 VIEW  COMPARISON:  11/29/2014 and  prior exams  FINDINGS: Cardiomegaly and CABG changes again noted.  Right lower lobe opacity again noted, best seen on the lateral view.  Mild interstitial prominence is present and could represent mild interstitial edema.  There may be tiny bilateral pleural effusions present.  There is no evidence of pneumothorax.  IMPRESSION: Right lower lobe opacity suspicious for pneumonia.  Cardiomegaly with mild interstitial opacities which could represent mild interstitial edema.  Question tiny bilateral pleural effusions.   Electronically Signed   By: Margarette Canada M.D.   On: 11/29/2014 20:02   Dg Chest Port 1 View  11/29/2014   CLINICAL DATA:  Shortness of Breath  EXAM: PORTABLE CHEST - 1 VIEW  COMPARISON:  10/06/2014 and 06/19/2014  FINDINGS: Cardiomegaly again noted. Status post CABG. Mild interstitial prominence bilaterally suspicious for mild interstitial edema. Hazy right basilar atelectasis or infiltrate.  IMPRESSION: Mild interstitial prominence bilaterally suspicious for mild interstitial edema. Status post CABG. Cardiomegaly. Hazy right basilar atelectasis or infiltrate.   Electronically Signed   By: Lahoma Crocker M.D.   On: 11/29/2014 09:19   Medications: I have reviewed the patient's current medications. Scheduled Meds: . amLODipine  10 mg Oral QAC breakfast  . atorvastatin  80 mg Oral QHS  . guaiFENesin-dextromethorphan  5 mL Oral TID AC & HS  . pantoprazole  40 mg Oral Daily  . piperacillin-tazobactam (ZOSYN)  IV  3.375 g Intravenous 3 times per day  . tiotropium  1 capsule Inhalation Daily  . Warfarin - Pharmacist Dosing Inpatient   Does not apply q1800   Continuous Infusions:  PRN Meds:.albuterol, HYDROcodone-acetaminophen Assessment/Plan: Principal Problem:   HCAP (healthcare-associated pneumonia) Active Problems:   Carotid artery disease   COPD GOLD II still smoking    Cigarette smoker   CKD (chronic kidney disease), stage IV   Sleep apnea   Atrial fibrillation   Chronic diastolic CHF  (congestive heart failure)   Sepsis   HCAP (healthcare acquired pneumonia) - Was in hospital 2 months ago for 2 days with cellulitis - Patient with shortness of breath with deep inspiration, mild cough, and CXR suggestive of right lower lobe infiltrate. - Blood cultures obtained in ED growing G- rods - Started on Zosyn for G- rods on blood culture - Stopped lasix, will monitor volume status closely  - Swallow evaluation for possible aspiration PNA   Sepsis - Blood culture growing G- rods - Continue Zosyn - Lactic acid on admission was 1.27, will recheck today - Bisoprolol stopped as BP dropped overnight - U/A negative for leukocytes and nitirite, 3-6 WBCs, Urine culture pending - HIV and Legionella pending, Urine Strep negative - Stopped  lasix, will not give any IVF due to pt's history of CHF. Monitor fluid status.   Carotid artery disease- s/p CABG - No active chest pain, initial troponin negative, EKG without any signs of new ischemia. - Continue home medications, do not suspect ACS.   COPD GOLD II still smoking  - Continue spirvia - Goal O2 sat 89-94% - Albuterol PRN   Cigarette smoker - Needs to stop smoking. - RN to provide smoking cessation education   CKD (chronic kidney disease), stage IV - SCr at 3.18 from 2.4 yesterday. Appears to be around his baseline from previous admissions.  - Will monitor daily   Sleep apnea - CPAP QHS   Atrial fibrillation - CHADSVasc at least 4 - Continue coumadin per pharmacy - Stopped bisoprolol overnight 2/2 drop in BP   Chronic diastolic CHF (congestive heart failure) - Does appear to have some evidence of excess fluid on his CXR and his BNP was elevated at 558. I do not think this is the primary driver for his admission. -Stop lasix with plan for close observation of his fluid status. - His amlodipine may be worsening his LE edema and one may consider a change to a central acting CCB that may help with proteinuria  however this may have too much nodal blocking given he is already taking a BB at home and HR is well controlled. - Strict I&O - Daily weights - Will have BiPAP available if needed.  DVTPPx: Coumadin  Diet: HH  Code Status: Full  Dispo: Disposition is deferred at this time, awaiting improvement of current medical problems.  Anticipated discharge in approximately 1-3 day(s).   The patient does have a current PCP Christain Sacramento, MD) and does need an [facility] hospital follow-up appointment after discharge.  The patient does not have transportation limitations that hinder transportation to clinic appointments.   LOS: 1 day   Maryellen Pile, MD 11/30/2014, 9:24 AM

## 2014-11-30 NOTE — Progress Notes (Signed)
ANTICOAGULATION CONSULT NOTE - Follow Up Consult  Pharmacy Consult for Warfarin Indication: atrial fibrillation Labs:  Recent Labs  11/29/14 0904 11/30/14 0221  HGB 14.4 14.2  HCT 43.7 44.0  PLT 211 219  LABPROT 30.3* 33.9*  INR 2.95* 3.43*  CREATININE 2.42* 3.18*    Estimated Creatinine Clearance: 23.3 mL/min (by C-G formula based on Cr of 3.18).  Assessment: 41 YOM who [facility] with SOB, CP, flank pain concerning for PNA - the patient was started on empiric Rocephin + Azithromycin for CAP. The patient was on warfarin PTA for hx Afib - pharmacy has been consulted to resume this during the inpatient stay.  Coag/heme: Afib CBC stable, but now INR above goal after starting antibiotics and continuing home dose of warfarin., no s/sx of bleeding noted at this time.  PTA: Admit INR 2.95 on PTA dose of 5 mg daily. Last dose PTA was on 7/8,    Goal of Therapy:  INR 2-3 Monitor platelets by anticoagulation protocol: Yes   Plan:  Warfarin 1 mg today Daily INR   Thank you for allowing pharmacy to be a part of this patients care team.  Rowe Robert Pharm.D., BCPS, AQ-Cardiology Clinical Pharmacist 11/30/2014 9:39 AM Pager: [phone] Phone: [phone]

## 2014-11-30 NOTE — Progress Notes (Signed)
Internal Medicine Night Float Progress Note  Subjective  69 yo male seen and examined at bedside, sitting upright.  Reports that he still has some right sided flank pain most prominent during inspiration.  Pt also states having some tightness in his abdomen that he attributes to fluid but denies any abdominal pain, SOB, wheezing, or chest pain.  He continues to be concerned about his kidney function and not receiving Lasix.  Pt maintained good O2 saturation throughout exam and conversation on 3L via [place].  Objective Filed Vitals:   11/30/14 1926  BP: 145/68  Pulse: 72  Temp: 97.8 F (36.6 C)  Resp: 19   General: alert and oriented, NAD, afebrile Lungs: no increased respiratory effort, no wheezes.  Positive for crackles in bilateral lower lobes.   Heart: rhythm irregular.  No murmurs noted. Abdomen: Distended, soft, non-tender to palpation.  Positive bowel sounds.   Extremities: pulses in LE are diminished.  Pitting edema 2+ bilaterally above the knee.  Assessment and Plan 70 yo male with PMHx of CKD, CAD, PAD, Afib.  Blood cx positive for gram negative bacteremia, uncertain source.  Continue with current plan as pt appears to be doing well on exam and is in no acute respiratory distress.  Will be reassessed in the AM by the day team.

## 2014-11-30 NOTE — Evaluation (Addendum)
Physical Therapy Evaluation and D/C Patient Details Name: SERGI PICKRON MRN: [id number] DOB: 1945/10/11 Today's Date: 11/30/2014   History of Present Illness  69 y/o male with PMH of CKD stage 4, CAD, PAD, AAA, s/p aortobifemoral bypass in Jan 2016 who p/w SOB * 1 day. Patient states that he was feeling nauseous and slightly unwell the day PTA and woke up yesterday with SOB. It was assoc with b/l flank pain worse on the right and chills. Pain was worse on coughing and taking a deep breath. He denied palpitations or diaphoresis/ no n/v, no dysuria, no increased urinary frequency. He does complain of LE swelling and is compliant with his lasix.   Clinical Impression  Pt admitted with above diagnosis. Pt currently without significant functional limitations with pt ambulation with Independence.  Desats with incr exertion therefore may need home O2.  No further PT needs at this time.  Will sign off.    Follow Up Recommendations No PT follow up    Equipment Recommendations  None recommended by PT ?home O2 (see note below)   Recommendations for Other Services       Precautions / Restrictions Precautions Precautions: Fall Restrictions Weight Bearing Restrictions: No      Mobility  Bed Mobility Overal bed mobility: Independent                Transfers Overall transfer level: Independent                  Ambulation/Gait Ambulation/Gait assistance: Independent   Assistive device: None Gait Pattern/deviations: Step-through pattern;Decreased stride length   Gait velocity interpretation: at or above normal speed for age/gender General Gait Details: Pt able to accept challenges to balance and no LOB.  Pt has some bil LE pain but did not get in the way of safe mobility.  Did desat with up and down stairs but not with ambulation.   Stairs Stairs: Yes Stairs assistance: Modified independent (Device/Increase time) Stair Management: One rail Right;Alternating  pattern;Forwards Number of Stairs: 10 General stair comments: Pt was able to ascend and descend steps without difficulty physically.  Does desat.    Wheelchair Mobility    Modified Rankin (Stroke Patients Only)       Balance Overall balance assessment: No apparent balance deficits (not formally assessed)                                           Pertinent Vitals/Pain Pain Assessment: Faces Faces Pain Scale: Hurts little more Pain Location: bil LEs  Pain Descriptors / Indicators: Sore Pain Intervention(s): Limited activity within patient's tolerance;Monitored during session;Repositioned  SATURATION QUALIFICATIONS: (This note is used to comply with regulatory documentation for home oxygen)  Patient Saturations on Room Air at Rest = 97%  Patient Saturations on Room Air while Ambulating = 92%  Patient saturations on Room Air while ascending and descending stairs = 82-87%  Patient Saturations on 2 Liters of oxygen while Ambulating up and down steps = 90-92%  Please briefly explain why patient needs home oxygen:Pt may need O2 with exertional activities.    Home Living Family/patient expects to be discharged to:: Private residence Living Arrangements: Spouse/significant other Available Help at Discharge: Family Type of Home: House Home Access: Stairs to enter Entrance Stairs-Rails: Right Entrance Stairs-Number of Steps: 7 Home Layout: Multi-level;Laundry or work area in [employer]: None  Prior Function Level of Independence: Independent               Hand Dominance        Extremity/Trunk Assessment   Upper Extremity Assessment: Defer to OT evaluation           Lower Extremity Assessment: Generalized weakness      Cervical / Trunk Assessment: Normal  Communication   Communication: No difficulties  Cognition Arousal/Alertness: Awake/alert Behavior During Therapy: WFL for tasks assessed/performed Overall Cognitive  Status: Within Functional Limits for tasks assessed                      General Comments      Exercises        Assessment/Plan    PT Assessment Patent does not need any further PT services  PT Diagnosis     PT Problem List    PT Treatment Interventions     PT Goals (Current goals can be found in the Care Plan section) Acute Rehab PT Goals PT Goal Formulation: All assessment and education complete, DC therapy    Frequency     Barriers to discharge        Co-evaluation               End of Session Equipment Utilized During Treatment: Gait belt;Oxygen Activity Tolerance: Patient limited by fatigue Patient left: in bed;with call bell/phone within reach Nurse Communication: Mobility status         Time: [id number] PT Time Calculation (min) (ACUTE ONLY): 11 min   Charges:   PT Evaluation $Initial PT Evaluation Tier I: 1 Procedure     PT G CodesDenice Paradise 12/15/2014, 10:52 AM  [facility] [phone] (pager)

## 2014-11-30 NOTE — Evaluation (Signed)
Clinical/Bedside Swallow Evaluation Patient Details  Name: Barry Lawrence MRN: [id number] Date of Birth: [birth year]  Today's Date: 11/30/2014 Time: SLP Start Time (ACUTE ONLY): 0302 SLP Stop Time (ACUTE ONLY): 0126 SLP Time Calculation (min) (ACUTE ONLY): 1344 min  Past Medical History:  Past Medical History  Diagnosis Date  . CAD (coronary artery disease)     myoview 04/21/11-normal, EF49%  . S/P CABG (coronary artery bypass graft) 1996  . Hyperlipemia   . Polycythemia     H/O  . GERD (gastroesophageal reflux disease)   . Tobacco abuse   . CKD (chronic kidney disease), stage III   . Myocardial infarction 1996  . OSA on CPAP     CPAP q night , CPAP is through [place], states doesn't remember when he had the last study  . Pneumonia     hosp. as a child with pneumonia, not since   . Diabetes     BORDERLINE  . Headache     uses [employer], states he is lessening what he is taking for prep. for surgery   . Arthritis     in his back  . Hypertension     stress test- done 04/2014, followed by Dr. Gwenlyn Found  . Carotid stenosis 01/15/08    right endarterectomy-Dr. Amedeo Plenty  . PAD (peripheral artery disease) 06/12/09    a. 11/22/07 PTA & stenting right external iliac artery;bilateral iliac & PTI and stenting 1997;right ICA =>50% reduction,right SFA >50%,left ICA at stent => 50% reduction,left EIA => 50% reduction,left ATA occluded, left SFA mid > 49% reduction;  03/2014 Angio: LRA 90, patent L Iliac stent and distal RCIA stent, large saccular AAA.  [name] Abdominal aortic aneurysm 01/28/10; 02/05/14    4.3x4.6cm;  now measuring 5.5 cm, status post aortobifemoral bypass grafting in January 2016 by Dr. problem along with left renal artery bypass   Past Surgical History:  Past Surgical History  Procedure Laterality Date  . Carotid endarterectomy Right 01/15/08  . Lumbar disc surgery  1998  . Pv angio  11/22/2007    PTA/ stenting of right common iliac artery with a 10x67mm Smart stent and postdilated with a  7x76mmpowerflex balloon; high grade calcified stenosis of the RICA  . Colonoscopy N/A 07/09/2013    Procedure: COLONOSCOPY;  Surgeon: Juanita Craver, MD;  Location: [facility];  Service: Endoscopy;  Laterality: N/A;  . Pv angio  04/01/14    AAA, Lt renal artery stenosis, patent iliacs  . Abdominal aortagram N/A 04/01/2014    Procedure: ABDOMINAL Maxcine Ham;  Surgeon: [name], MD;  Location: [facility] CATH LAB;  Service: Cardiovascular;  Laterality: N/A;  . [facility] surgery  1988    at [facility]  . Coronary artery bypass graft  1996    LIMA to LAD, sequential vein to OM1 and 2 as well as acure marginal branch and diag branch  . Aorta - bilateral femoral artery bypass graft N/A 05/30/2014    Procedure: AORTOBIFEMORAL BYPASS GRAFT;  Surgeon: Serafina Mitchell, MD;  Location: [place];  Service: Vascular;  Laterality: N/A;  . Aortic/renal bypass Left 05/30/2014    Procedure: LEFT RENAL ARTERY BYPASS;  Surgeon: Serafina Mitchell, MD;  [facility]: [facility] OR;  Service: Vascular;  Laterality: Left;   HPI:  Pt is a 69 y/o male with PMH of CKD stage 4, CAD, PAD, AAA, s/p aortobifemoral bypass in Jan 2016 who p/w SOB * 1 day. Patient states that he was feeling nauseous and slightly unwell the day PTA and woke up  yesterday with SOB. It was assoc with b/l flank pain worse on the right and chills. Pain was worse on coughing and taking a deep breath. Chest x ray 11-29-14 showed right lower lobe opacity suspicious for pneumonia.    Assessment / Plan / Recommendation Clinical Impression  Pt with questionable pharyngeal dysphagia. Pt with a history of dysphagia following a hospital stay in January of 2016 including an intubation period. FEES was performed which was significant for one instance of trace aspiration in addition to penetration and diet recommendation of thin liqluids and dysphagia 2 consistencies with use of swallow strategies. Clinically this date pt with delayed cough x1  following multiple thin liquid trials and intermittent  throat clearing. Pt however was not receptive to ST intervention and denies any problems with swallowing. Recommend continue current diet and objectively re evaluation swallowing to rule out or confirm aspiration as cause of right lower lobe opacity seen on most recent chest x ray.  MBS to be scheduled.     Aspiration Risk  Mild    Diet Recommendation Thin;Age appropriate regular solids   Medication Administration: Whole meds with liquid Compensations: Slow rate;Small sips/bites    Other  Recommendations Oral Care Recommendations: Oral care BID   Follow Up Recommendations       Frequency and Duration min 1 x/week  1 week   Pertinent Vitals/Pain     SLP Swallow Goals     Swallow Study Prior Functional Status       General Date of Onset: 11/29/14 Other Pertinent Information: Pt is a 69 y/o male with PMH of CKD stage 4, CAD, PAD, AAA, s/p aortobifemoral bypass in Jan 2016 who p/w SOB * 1 day. Patient states that he was feeling nauseous and slightly unwell the day PTA and woke up yesterday with SOB. It was assoc with b/l flank pain worse on the right and chills. Pain was worse on coughing and taking a deep breath. Chest x ray 11-29-14 showed right lower lobe opacity suspicious for pneumonia.  Type of Study: Bedside swallow evaluation Previous Swallow Assessment: FEES on 06-20-14 (aspiration of thins x1, diet rec: thin and dysphagia 2) Diet Prior to this Study: Regular;Thin liquids Temperature Spikes Noted: No Respiratory Status: Supplemental O2 delivered via (comment) History of Recent Intubation: No Behavior/Cognition: Alert Oral Cavity - Dentition: Adequate natural dentition/normal for age Self-Feeding Abilities: Able to feed self Patient Positioning: Upright in bed Baseline Vocal Quality: Normal Volitional Cough: Weak;Congested Volitional Swallow: Able to elicit    Oral/Motor/Sensory Function Overall Oral Motor/Sensory Function: Appears within functional limits for tasks  assessed Labial ROM: Within Functional Limits Labial Symmetry: Within Functional Limits Labial Strength: Within Functional Limits Lingual ROM: Within Functional Limits Lingual Symmetry: Within Functional Limits Lingual Strength: Within Functional Limits Facial Symmetry: Within Functional Limits   Ice Chips Ice chips: Within functional limits   Thin Liquid Thin Liquid: Impaired Presentation: Cup;Spoon;Straw Pharyngeal  Phase Impairments: Throat Clearing - Immediate;Cough - Delayed    Nectar Thick     Honey Thick     Puree Puree: Within functional limits   Solid   GO    Solid: Within functional limits      Arvil Chaco MA, [place]    Arvil Chaco E 11/30/2014,3:40 PM

## 2014-12-01 ENCOUNTER — Inpatient Hospital Stay (HOSPITAL_COMMUNITY): Payer: Medicare Other

## 2014-12-01 DIAGNOSIS — E871 Hypo-osmolality and hyponatremia: Secondary | ICD-10-CM

## 2014-12-01 DIAGNOSIS — D72829 Elevated white blood cell count, unspecified: Secondary | ICD-10-CM

## 2014-12-01 LAB — BASIC METABOLIC PANEL
Anion gap: 10 (ref 5–15)
BUN: 62 mg/dL — ABNORMAL HIGH (ref 6–20)
CO2: 24 mmol/L (ref 22–32)
Calcium: 9.1 mg/dL (ref 8.9–10.3)
Chloride: 94 mmol/L — ABNORMAL LOW (ref 101–111)
Creatinine, Ser: 3.33 mg/dL — ABNORMAL HIGH (ref 0.61–1.24)
GFR calc Af Amer: 20 mL/min — ABNORMAL LOW (ref >60)
GFR calc non Af Amer: 18 mL/min — ABNORMAL LOW (ref >60)
Glucose, Bld: 110 mg/dL — ABNORMAL HIGH (ref 65–99)
Potassium: 5.4 mmol/L — ABNORMAL HIGH (ref 3.5–5.1)
Sodium: 128 mmol/L — ABNORMAL LOW (ref 135–145)

## 2014-12-01 LAB — CBC
HCT: 41.6 % (ref 39.0–52.0)
Hemoglobin: 13.5 g/dL (ref 13.0–17.0)
MCH: 28.7 pg (ref 26.0–34.0)
MCHC: 32.5 g/dL (ref 30.0–36.0)
MCV: 88.3 fL (ref 78.0–100.0)
Platelets: 201 10*3/uL (ref 150–400)
RBC: 4.71 MIL/uL (ref 4.22–5.81)
RDW: 15.9 % — ABNORMAL HIGH (ref 11.5–15.5)
WBC: 17.9 10*3/uL — ABNORMAL HIGH (ref 4.0–10.5)

## 2014-12-01 LAB — CULTURE, BLOOD (ROUTINE X 2)

## 2014-12-01 LAB — URINE CULTURE: Culture: NO GROWTH

## 2014-12-01 LAB — PROTIME-INR
INR: 4.1 — ABNORMAL HIGH (ref 0.00–1.49)
Prothrombin Time: 38.7 seconds — ABNORMAL HIGH (ref 11.6–15.2)

## 2014-12-01 MED ORDER — FUROSEMIDE 10 MG/ML IJ SOLN
40.0000 mg | Freq: Once | INTRAMUSCULAR | Status: AC
Start: 2014-12-01 — End: 2014-12-01
  Administered 2014-12-01: 40 mg via INTRAVENOUS
  Filled 2014-12-01: qty 4

## 2014-12-01 MED ORDER — CEFTRIAXONE SODIUM IN DEXTROSE 40 MG/ML IV SOLN
2.0000 g | INTRAVENOUS | Status: DC
  Administered 2014-12-01 – 2014-12-03 (×3): 2 g via INTRAVENOUS
  Filled 2014-12-01 (×3): qty 50

## 2014-12-01 MED ORDER — FUROSEMIDE 10 MG/ML IJ SOLN: 40.0000 mg | Freq: Every day | INTRAMUSCULAR | Status: DC

## 2014-12-01 NOTE — Progress Notes (Signed)
RT Note:  RT came and attempted polacing cpap on pt even though he refused for several nights.  RT placed cpap, and pt immediately pulled it off stating he couldn't wear it because it was uncomfortable and too stiff.  RT adjusted pressure to pt's home pressure and pt was unhappy with the feel of the machine.  RT will continue to monitor.

## 2014-12-01 NOTE — Progress Notes (Signed)
ANTICOAGULATION CONSULT NOTE - Follow Up Consult  Pharmacy Consult for Warfarin Indication: atrial fibrillation Labs:  Recent Labs  11/29/14 0904 11/30/14 0221 12/01/14 0251  HGB 14.4 14.2 13.5  HCT 43.7 44.0 41.6  PLT 211 219 201  LABPROT 30.3* 33.9* 38.7*  INR 2.95* 3.43* 4.10*  CREATININE 2.42* 3.18* 3.33*    Estimated Creatinine Clearance: 22.5 mL/min (by C-G formula based on Cr of 3.33).  Assessment: 77 YOM who presented to [facility]8 with SOB, CP, flank pain concerning for PNA - the patient was started on empiric Rocephin + Azithromycin for CAP. The patient was on warfarin PTA for hx Afib - pharmacy has been consulted to resume this during the inpatient stay.  Coag/heme: Afib CBC stable, but now INR continues to trend up after starting antibiotics, despite reducing dose to 1mg  7/9, no s/sx of bleeding noted at this time + abdominal pain 7/9 pm PTA: Admit INR 2.95 on PTA dose of 5 mg daily. Last dose PTA was on 7/8,   Goal of Therapy:  INR 2-3 Monitor platelets by anticoagulation protocol: Yes   Plan:  No warfarin today Daily INR   Thank you for allowing pharmacy to be a part of this patients care team.  Rowe Robert Pharm.D., BCPS, AQ-Cardiology Clinical Pharmacist 12/01/2014 10:02 AM Pager: [phone] Phone: [phone]

## 2014-12-01 NOTE — Progress Notes (Signed)
Patient seen and examined independent of resident. Case d/w residents in detail. I agree with findings and plan as documented in Dr. Talmadge Coventry note.  Patient feels better. Still with mild right sided chest pain on coughing. He also notes worsening swelling of his legs and abdomen. This is likely secondary to the holding of his home lasix. He also is mildly hyponatremic and has worsening creatinine as well.  Will start IV lasix 40 mg and monitor urine output. Will recheck BMP in the AM  Patient with sepsis secondary to PNA and gram negative bacteremia. Blood cx + for Klebsiella pneumoniae which appears pan sensitive. Will d/c zosyn and start IV ceftriaxone. Leukocytosis mildly improved. Will monitor.

## 2014-12-01 NOTE — Progress Notes (Signed)
MBSS complete. Full report located under chart review in imaging section. Nathanael Krist, MA CCC-SLP [phone]  

## 2014-12-01 NOTE — Progress Notes (Signed)
Subjective: Pt sitting in bed. Vitals stable. In no distress. Appears better than yesterday. Says his urine output has not improved since he came in, but appears his urine output per chart- 2.2L yesterday. Weight appears increased.  Objective: Vital signs in last 24 hours: Filed Vitals:   12/01/14 0700 12/01/14 0800 12/01/14 0826 12/01/14 0900  BP: 128/78 151/80 151/80 149/75  Pulse: 66 80 69 81  Temp:   98.1 F (36.7 C)   TempSrc:   Oral   Resp: 16 24 19 24   Height:      Weight:      SpO2: 92% 96% 97% 94%   Weight change: 8 lb 6.8 oz (3.822 kg)  Intake/Output Summary (Last 24 hours) at 12/01/14 0956 Last data filed at 12/01/14 [id number]  Gross per 24 hour  Intake    820 ml  Output   2325 ml  Net  -1505 ml   Physical Exam: GENERAL- alert, co-operative, appears as stated age, not in any distress. CARDIAC- irregularly irregularly rhythm, normal rate RESP- Moving equal volumes of air, bibasilar crackles, worse on left , but no features of respiratory distress. ABDOMEN- Soft, nontender, no guarding or rebound, bowel sounds present. EXTREMITIES- diminished LE pulses 1+ bilaterally, 2+ pitting edema to knees bilaterally, worse compared to days prior.  SKIN- Warm, dry, No rash or lesion. PSYCH- Normal mood and affect, appropriate thought content and speech.  Lab Results: Basic Metabolic Panel:  Recent Labs Lab 11/30/14 0221 12/01/14 0251  NA 132* 128*  K 5.1 5.4*  CL 94* 94*  CO2 26 24  GLUCOSE 151* 110*  BUN 55* 62*  CREATININE 3.18* 3.33*  CALCIUM 9.3 9.1   Liver Function Tests:  Recent Labs Lab 11/29/14 0904  AST 17  ALT 16*  ALKPHOS 98  BILITOT 0.6  PROT 6.2*  ALBUMIN 3.4*   CBC:  Recent Labs Lab 11/29/14 0904 11/30/14 0221 12/01/14 0251  WBC 10.6* 18.3* 17.9*  NEUTROABS 8.6*  --   --   HGB 14.4 14.2 13.5  HCT 43.7 44.0 41.6  MCV 89.0 89.2 88.3  PLT 211 219 201   Coagulation:  Recent Labs Lab 11/29/14 0904 11/30/14 0221 12/01/14 0251    LABPROT 30.3* 33.9* 38.7*  INR 2.95* 3.43* 4.10*   Urinalysis:  Recent Labs Lab 11/29/14 1025  COLORURINE YELLOW  LABSPEC 1.016  PHURINE 5.0  GLUCOSEU NEGATIVE  HGBUR SMALL*  BILIRUBINUR NEGATIVE  KETONESUR NEGATIVE  PROTEINUR >300*  UROBILINOGEN 0.2  NITRITE NEGATIVE  LEUKOCYTESUR NEGATIVE   Micro Results: Recent Results (from the past 240 hour(s))  Culture, blood (routine x 2)     Status: None   Collection Time: 11/29/14 10:00 AM  Result Value Ref Range Status   Specimen Description BLOOD LEFT ANTECUBITAL  Final   Special Requests BOTTLES DRAWN AEROBIC AND ANAEROBIC 10CC  Final   Culture  Setup Time   Final    GRAM NEGATIVE RODS IN BOTH AEROBIC AND ANAEROBIC BOTTLES CRITICAL RESULT CALLED TO, READ BACK BY AND VERIFIED WITH: Lowella Bandy 07.08.16 2226 M SHIPMAN CONFIRMED BY A BROWNING    Culture KLEBSIELLA PNEUMONIAE  Final   Report Status 12/01/2014 FINAL  Final   Organism ID, Bacteria KLEBSIELLA PNEUMONIAE  Final      Susceptibility   Klebsiella pneumoniae - MIC*    AMPICILLIN >=32 RESISTANT Resistant     CEFAZOLIN <=4 SENSITIVE Sensitive     CEFEPIME <=1 SENSITIVE Sensitive     CEFTAZIDIME <=1 SENSITIVE Sensitive  CEFTRIAXONE <=1 SENSITIVE Sensitive     CIPROFLOXACIN <=0.25 SENSITIVE Sensitive     GENTAMICIN <=1 SENSITIVE Sensitive     IMIPENEM <=0.25 SENSITIVE Sensitive     TRIMETH/SULFA <=20 SENSITIVE Sensitive     AMPICILLIN/SULBACTAM 4 SENSITIVE Sensitive     PIP/TAZO <=4 SENSITIVE Sensitive     * KLEBSIELLA PNEUMONIAE  Culture, blood (routine x 2)     Status: None   Collection Time: 11/29/14 10:05 AM  Result Value Ref Range Status   Specimen Description BLOOD RIGHT ANTECUBITAL  Final   Special Requests BOTTLES DRAWN AEROBIC AND ANAEROBIC 5CC  Final   Culture  Setup Time   Final    GRAM NEGATIVE RODS IN BOTH AEROBIC AND ANAEROBIC BOTTLES CRITICAL RESULT CALLED TO, READ BACK BY AND VERIFIED WITH: M ENNIS,RN 07.08.16 2226 M SHIPMAN CONFIRMED BY A  BROWNING    Culture   Final    KLEBSIELLA PNEUMONIAE SUSCEPTIBILITIES PERFORMED ON PREVIOUS CULTURE WITHIN THE LAST 5 DAYS.    Report Status 12/01/2014 FINAL  Final  MRSA PCR Screening     Status: None   Collection Time: 11/29/14 12:27 PM  Result Value Ref Range Status   MRSA by PCR NEGATIVE NEGATIVE Final    Comment:        The GeneXpert MRSA Assay (FDA approved for NASAL specimens only), is one component of a comprehensive MRSA colonization surveillance program. It is not intended to diagnose MRSA infection nor to guide or monitor treatment for MRSA infections.    Studies/Results: Dg Chest 2 View  11/29/2014   CLINICAL DATA:  Shortness of breath and chest pain.  EXAM: CHEST  2 VIEW  COMPARISON:  11/29/2014 and prior exams  FINDINGS: Cardiomegaly and CABG changes again noted.  Right lower lobe opacity again noted, best seen on the lateral view.  Mild interstitial prominence is present and could represent mild interstitial edema.  There may be tiny bilateral pleural effusions present.  There is no evidence of pneumothorax.  IMPRESSION: Right lower lobe opacity suspicious for pneumonia.  Cardiomegaly with mild interstitial opacities which could represent mild interstitial edema.  Question tiny bilateral pleural effusions.   Electronically Signed   By: Margarette Canada M.D.   On: 11/29/2014 20:02   Medications: I have reviewed the patient's current medications. Scheduled Meds: . amLODipine  10 mg Oral QAC breakfast  . atorvastatin  80 mg Oral QHS  . furosemide  40 mg Intravenous Daily  . guaiFENesin-dextromethorphan  5 mL Oral TID AC & HS  . pantoprazole  40 mg Oral Daily  . piperacillin-tazobactam (ZOSYN)  IV  3.375 g Intravenous 3 times per day  . tiotropium  1 capsule Inhalation Daily  . Warfarin - Pharmacist Dosing Inpatient   Does not apply q1800   Continuous Infusions:  PRN Meds:.albuterol, HYDROcodone-acetaminophen Assessment/Plan: Principal Problem:   HCAP  (healthcare-associated pneumonia) Active Problems:   Carotid artery disease   COPD GOLD II still smoking    Cigarette smoker   CKD (chronic kidney disease), stage IV   Sleep apnea   Atrial fibrillation   Chronic diastolic CHF (congestive heart failure)   Sepsis   HCAP (healthcare acquired pneumonia)- Last hospital admission- May for cellulitis. Blood cultures growing Gram neg Rods. Appears to be improving symptomatically. - Monitor vitals- BP, O2 sats - Transfer off step down tomorrow. - Cont Zosyn - Swallow evaluation for possible aspiration PNA   Gram negative Sepsis- B/c 11/29/2014, awaiting speciation. Lactic acid WNL. HIV neg and Strep neg.  -  Repeat Blood cultures today to ensure clearance of bact from blood stream - Continue Zosyn - Bisoprolol, D/c for gram neg sepsis, watch Heart rate.  - U/A negative for leukocytes and nitirite, 3-6 WBCs, Urine culture pending - Legionella pending     Chronic diastolic CHF (congestive heart failure)- Appears fluid overloaded. - IV lasix - 40mg  once, see response, if inadeq, give IV lasix 40mg  BID and if adequate cont with PO tomorrow. - Strict I&O - Daily weights   Carotid artery disease- s/p CABG - No active chest pain, initial troponin negative, EKG without any signs of new ischemia. - Continue home medications, do not suspect ACS.  Atrial fibrillation - CHADSVasc at least 4 - Continue coumadin per pharmacy - Stopped bisoprolol overnight 2/2 drop in BP   COPD GOLD II still smoking. PT eval- pt will require home O2 with activities- O2 sats here with ambulation- 82-87%. Likely improve as Pneumonia resolves. - Re-eval prior to discharge, order home o2. - Continue spirvia - Goal O2 sat 89-94% - Albuterol PRN   Cigarette smoker - Needs to stop smoking. - RN to provide smoking cessation education   CKD (chronic kidney disease), stage IV - SCr at 3.18 from 2.4 yesterday. Appears to be around his baseline from previous  admissions.  - Will monitor daily   Sleep apnea - CPAP QHS  DVTPPx: Coumadin. INR suprtherapeutic- 4.1. - Per pharmacy.  Diet: HH  Code Status: Full  Dispo: Disposition is deferred at this time, awaiting improvement of current medical problems.  Anticipated discharge in approximately 1-3 day(s).   The patient does have a current PCP Christain Sacramento, MD) and does need an [facility] hospital follow-up appointment after discharge.  The patient does not have transportation limitations that hinder transportation to clinic appointments.   LOS: 2 days   Bethena Roys, MD 12/01/2014, 9:56 AM

## 2014-12-02 DIAGNOSIS — N184 Chronic kidney disease, stage 4 (severe): Secondary | ICD-10-CM

## 2014-12-02 DIAGNOSIS — Z951 Presence of aortocoronary bypass graft: Secondary | ICD-10-CM

## 2014-12-02 DIAGNOSIS — I5033 Acute on chronic diastolic (congestive) heart failure: Secondary | ICD-10-CM | POA: Insufficient documentation

## 2014-12-02 DIAGNOSIS — J150 Pneumonia due to Klebsiella pneumoniae: Secondary | ICD-10-CM

## 2014-12-02 DIAGNOSIS — I4891 Unspecified atrial fibrillation: Secondary | ICD-10-CM

## 2014-12-02 DIAGNOSIS — I5032 Chronic diastolic (congestive) heart failure: Secondary | ICD-10-CM

## 2014-12-02 DIAGNOSIS — A4150 Gram-negative sepsis, unspecified: Secondary | ICD-10-CM

## 2014-12-02 DIAGNOSIS — I2510 Atherosclerotic heart disease of native coronary artery without angina pectoris: Secondary | ICD-10-CM

## 2014-12-02 DIAGNOSIS — G4733 Obstructive sleep apnea (adult) (pediatric): Secondary | ICD-10-CM

## 2014-12-02 DIAGNOSIS — Y95 Nosocomial condition: Secondary | ICD-10-CM

## 2014-12-02 DIAGNOSIS — F17210 Nicotine dependence, cigarettes, uncomplicated: Secondary | ICD-10-CM

## 2014-12-02 DIAGNOSIS — J449 Chronic obstructive pulmonary disease, unspecified: Secondary | ICD-10-CM

## 2014-12-02 LAB — HEMOGLOBIN A1C
Hgb A1c MFr Bld: 5.8 % — ABNORMAL HIGH (ref 4.8–5.6)
Mean Plasma Glucose: 120 mg/dL

## 2014-12-02 LAB — LEGIONELLA ANTIGEN, URINE

## 2014-12-02 LAB — CBC
HCT: 45.2 % (ref 39.0–52.0)
Hemoglobin: 14.9 g/dL (ref 13.0–17.0)
MCH: 29.4 pg (ref 26.0–34.0)
MCHC: 33 g/dL (ref 30.0–36.0)
MCV: 89.3 fL (ref 78.0–100.0)
Platelets: 224 10*3/uL (ref 150–400)
RBC: 5.06 MIL/uL (ref 4.22–5.81)
RDW: 16.1 % — ABNORMAL HIGH (ref 11.5–15.5)
WBC: 12.3 10*3/uL — ABNORMAL HIGH (ref 4.0–10.5)

## 2014-12-02 LAB — BASIC METABOLIC PANEL
Anion gap: 10 (ref 5–15)
BUN: 52 mg/dL — ABNORMAL HIGH (ref 6–20)
CO2: 26 mmol/L (ref 22–32)
Calcium: 9.3 mg/dL (ref 8.9–10.3)
Chloride: 100 mmol/L — ABNORMAL LOW (ref 101–111)
Creatinine, Ser: 2.51 mg/dL — ABNORMAL HIGH (ref 0.61–1.24)
GFR calc Af Amer: 29 mL/min — ABNORMAL LOW (ref >60)
GFR calc non Af Amer: 25 mL/min — ABNORMAL LOW (ref >60)
Glucose, Bld: 109 mg/dL — ABNORMAL HIGH (ref 65–99)
Potassium: 4.3 mmol/L (ref 3.5–5.1)
Sodium: 136 mmol/L (ref 135–145)

## 2014-12-02 LAB — PROTIME-INR
INR: 2.74 — ABNORMAL HIGH (ref 0.00–1.49)
Prothrombin Time: 28.6 seconds — ABNORMAL HIGH (ref 11.6–15.2)

## 2014-12-02 MED ORDER — FUROSEMIDE 10 MG/ML IJ SOLN
40.0000 mg | Freq: Once | INTRAMUSCULAR | Status: AC
  Administered 2014-12-02: 40 mg via INTRAVENOUS
  Filled 2014-12-02: qty 4

## 2014-12-02 MED ORDER — WARFARIN SODIUM 3 MG PO TABS
3.0000 mg | ORAL_TABLET | Freq: Once | ORAL | Status: AC
  Administered 2014-12-02: 3 mg via ORAL
  Filled 2014-12-02: qty 1

## 2014-12-02 MED ORDER — PROMETHAZINE HCL 25 MG PO TABS
12.5000 mg | ORAL_TABLET | ORAL | Status: DC | PRN
  Administered 2014-12-02: 12.5 mg via ORAL
  Filled 2014-12-02: qty 1

## 2014-12-02 NOTE — Progress Notes (Signed)
Internal Medicine Attending  Date: 12/02/2014  Patient name: GUINN WEISHAUPT Medical record number: [id number] Date of birth: 1945/08/22 Age: 69 y.o. Gender: male  I saw and evaluated the patient. I reviewed the resident's note by Dr. Charlynn Grimes and I agree with the resident's findings and plans as documented in his progress note.  Mr. Boerboom is slowly improving clinically. In addition to the Klebsiella pneumonia, he likely was fluid overloaded. He responded very well to the dose of IV Lasix the day prior. On exam today he still has by basilar inspiratory rales, left greater than right, suggesting continued volume overload in addition to his pneumonia. He was switched to IV ceftriaxone to narrow his coverage, but will likely be discharged home on Augmentin to complete the course of antibiotics when ready for discharge. We will also give another dose of Lasix today and restart his home cardiac and antihypertensives regimen. I anticipate he will be stable for discharge in the next 24-48 hours with follow-up in his primary care provider's office.

## 2014-12-02 NOTE — Care Management (Signed)
Important Message  Patient Details  Name: Barry Lawrence MRN: [id number] Date of Birth: 1945/09/27   Medicare Important Message Given:  Yes-second notification given    Nathen May 12/02/2014, 3:52 PM

## 2014-12-02 NOTE — Discharge Summary (Signed)
Name: Barry Lawrence MRN: [place]:[id number] DOB: 1945/08/27 69 y.o. PCP: Christain Sacramento, MD  Date of Admission: 11/29/2014  8:36 AM Date of Discharge: 12/03/2014 Attending Physician: Aldine Contes, MD  Discharge Diagnosis: Principal Problem:   HCAP (healthcare-associated pneumonia) Active Problems:   Carotid artery disease   COPD GOLD II still smoking    Cigarette smoker   CKD (chronic kidney disease), stage IV   Sleep apnea   Atrial fibrillation   Chronic diastolic CHF (congestive heart failure)   Sepsis   Acute on chronic diastolic heart failure   Pneumonia due to Klebsiella pneumoniae  Discharge Medications:   Medication List    TAKE these medications        albuterol 108 (90 BASE) MCG/ACT inhaler  Commonly known as:  PROVENTIL HFA;VENTOLIN HFA  Inhale 2 puffs into the lungs every 6 (six) hours as needed for wheezing or shortness of breath.     amLODipine 10 MG tablet  Commonly known as:  NORVASC  Take 10 mg by mouth daily before breakfast.     atorvastatin 80 MG tablet  Commonly known as:  LIPITOR  Take 80 mg by mouth at bedtime.     bisoprolol 5 MG tablet  Commonly known as:  ZEBETA  Take 5 mg by mouth daily.     cephALEXin 500 MG capsule  Commonly known as:  KEFLEX  Take 1 capsule (500 mg total) by mouth 2 (two) times daily.     furosemide 40 MG tablet  Commonly known as:  LASIX  Take by mouth 2 (two) times daily. 60 mg q am and 40 mg q pm     GOODY HEADACHE PO  as needed.     omeprazole 20 MG capsule  Commonly known as:  PRILOSEC  Take 20 mg by mouth at bedtime.     potassium chloride SA 20 MEQ tablet  Commonly known as:  K-DUR,KLOR-CON  Take 20 mEq by mouth daily.     Tiotropium Bromide Monohydrate 2.5 MCG/ACT Aers  Commonly known as:  SPIRIVA RESPIMAT  Inhale 2 puffs into the lungs daily.     Vitamin D3 5000 UNITS Caps  Take 5,000 Units by mouth daily.     warfarin 5 MG tablet  Commonly known as:  COUMADIN  Take 5 mg by mouth one time only  at 6 PM.        Disposition and follow-up:   Barry Lawrence was discharged from [facility] in Stable condition.  At the hospital follow up visit please address:  1.  Symptomatic improved from PNA. SOB, wheezing, afebrile. Completed Keflex course. INR.   2.  Labs / imaging needed at time of follow-up: INR, CXR  3.  Pending labs/ test needing follow-up: Blood cultures drawn 7/10 final results (NG at 2 days on 7/12)  Follow-up Appointments: Follow-up Information    Follow up with [facility].   Why:  will deliver oxygen when you arrive home. please contact [facility] when you arrive home.   Contact information:   [address] [place] [address] [phone]       Follow up with [name], MD On 12/12/2014.   Specialty:  Family Medicine   Why:  1:40 PM   Contact information:   [address] [place] [address] [phone]       Discharge Instructions: Discharge Instructions    Call MD for:  difficulty breathing, headache or visual disturbances  Complete by:  As directed      Call MD for:  persistant dizziness or light-headedness    Complete by:  As directed      Call MD for:  temperature >100.4    Complete by:  As directed      Diet - low sodium heart healthy    Complete by:  As directed      Discharge instructions    Complete by:  As directed   Resume your home lasix regimen tomorrow.  We have given you a prescription for Keflex for 7 days. Take it twice a day until it runs out.  You have a follow up appointment with your PCP on 7/21 at 1:45.     For home use only DME oxygen    Complete by:  As directed   Mode or (Route):  Nasal cannula  Liters per Minute:  3  Frequency:  Continuous (stationary and portable oxygen unit needed)  Oxygen conserving device:  No  Oxygen delivery system:  Gas     Increase activity slowly    Complete by:  As directed            Consultations:  None  Procedures  Performed:  Dg Chest 2 View  11/29/2014   CLINICAL DATA:  Shortness of breath and chest pain.  EXAM: CHEST  2 VIEW  COMPARISON:  11/29/2014 and prior exams  FINDINGS: Cardiomegaly and CABG changes again noted.  Right lower lobe opacity again noted, best seen on the lateral view.  Mild interstitial prominence is present and could represent mild interstitial edema.  There may be tiny bilateral pleural effusions present.  There is no evidence of pneumothorax.  IMPRESSION: Right lower lobe opacity suspicious for pneumonia.  Cardiomegaly with mild interstitial opacities which could represent mild interstitial edema.  Question tiny bilateral pleural effusions.   Electronically Signed   By: Margarette Canada M.D.   On: 11/29/2014 20:02   Dg Chest Port 1 View  11/29/2014   CLINICAL DATA:  Shortness of Breath  EXAM: PORTABLE CHEST - 1 VIEW  COMPARISON:  10/06/2014 and 06/19/2014  FINDINGS: Cardiomegaly again noted. Status post CABG. Mild interstitial prominence bilaterally suspicious for mild interstitial edema. Hazy right basilar atelectasis or infiltrate.  IMPRESSION: Mild interstitial prominence bilaterally suspicious for mild interstitial edema. Status post CABG. Cardiomegaly. Hazy right basilar atelectasis or infiltrate.   Electronically Signed   By: Lahoma Crocker M.D.   On: 11/29/2014 09:19   Dg Swallowing Func-speech Pathology  12/01/2014    Objective Swallowing Evaluation:    Patient Details  Name: Barry Lawrence MRN: [place]:[id number] Date of Birth: 1946/04/07  Today's Date: 12/01/2014 Time: SLP Start Time (ACUTE ONLY): 1200-SLP Stop Time (ACUTE ONLY): 1215 SLP Time Calculation (min) (ACUTE ONLY): 15 min  Past Medical History:  Past Medical History  Diagnosis Date  . CAD (coronary artery disease)     myoview 04/21/11-normal, EF49%  . S/P CABG (coronary artery bypass graft) 1996  . Hyperlipemia   . Polycythemia     H/O  . GERD (gastroesophageal reflux disease)   . Tobacco abuse   . CKD (chronic kidney disease), stage III   .  Myocardial infarction 1996  . OSA on CPAP     CPAP q night , CPAP is through [place], states doesn't remember when he had  the last study  . Pneumonia     hosp. as a child with pneumonia, not since   . Diabetes     BORDERLINE  .  Headache     uses [employer], states he is lessening what he is taking for prep.  for surgery   . Arthritis     in his back  . Hypertension     stress test- done 04/2014, followed by Dr. Gwenlyn Found  . Carotid stenosis 01/15/08    right endarterectomy-Dr. Amedeo Plenty  . PAD (peripheral artery disease) 06/12/09    a. 11/22/07 PTA & stenting right external iliac artery;bilateral iliac &  PTI and stenting 1997;right ICA =>50% reduction,right SFA >50%,left ICA at  stent => 50% reduction,left EIA => 50% reduction,left ATA occluded, left  SFA mid > 49% reduction;  03/2014 Angio: LRA 90, patent L Iliac stent and  distal RCIA stent, large saccular AAA.  [name] Abdominal aortic aneurysm 01/28/10; 02/05/14    4.3x4.6cm;  now measuring 5.5 cm, status post aortobifemoral bypass  grafting in January 2016 by Dr. problem along with left renal artery  bypass   Past Surgical History:  Past Surgical History  Procedure Laterality Date  . Carotid endarterectomy Right 01/15/08  . Lumbar disc surgery  1998  . Pv angio  11/22/2007    PTA/ stenting of right common iliac artery with a 10x75mm Smart stent  and postdilated with a 7x25mmpowerflex balloon; high grade calcified  stenosis of the RICA  . Colonoscopy N/A 07/09/2013    Procedure: COLONOSCOPY;  Surgeon: Juanita Craver, MD;  Location: [facility]  ENDOSCOPY;  Service: Endoscopy;  Laterality: N/A;  . Pv angio  04/01/14    AAA, Lt renal artery stenosis, patent iliacs  . Abdominal aortagram N/A 04/01/2014    Procedure: ABDOMINAL Maxcine Ham;  Surgeon: [name], MD;   Location: [facility] CATH LAB;  Service: Cardiovascular;  Laterality: N/A;  . Back surgery  1988    at [facility]  . Coronary artery bypass graft  1996    LIMA to LAD, sequential vein to OM1 and 2 as well as acure marginal  branch and diag branch  .  Aorta - bilateral femoral artery bypass graft N/A 05/30/2014    Procedure: AORTOBIFEMORAL BYPASS GRAFT;  Surgeon: Serafina Mitchell, MD;   Location: [facility];  Service: Vascular;  Laterality: N/A;  . Aortic/renal bypass Left 05/30/2014    Procedure: LEFT RENAL ARTERY BYPASS;  Surgeon: Serafina Mitchell, MD;   Location: [facility] OR;  Service: Vascular;  Laterality: Left;   HPI:  Other Pertinent Information: Pt is a 69 y/o male with PMH of CKD stage 4,  CAD, PAD, AAA, s/p aortobifemoral bypass in Jan 2016 who p/w SOB * 1 day.  Patient states that he was feeling nauseous and slightly unwell the day  PTA and woke up yesterday with SOB. It was assoc with b/l flank pain worse  on the right and chills. Pain was worse on coughing and taking a deep  breath. Chest x ray 11-29-14 showed right lower lobe opacity suspicious for  pneumonia.   No Data Recorded  Assessment / Plan / Recommendation CHL IP CLINICAL IMPRESSIONS 12/01/2014  Therapy Diagnosis Suspected primary esophageal dysphagia  Clinical Impression Pt demonstrates adequate oral and oropharyngeal  function with no penetration or aspiration observed. Esophageal sweep  revealed appearance of stasis in the mid to distal esophagus, slowly  clearing with thin liquids (no radiologist present to confirm). Discussed  basic esophageal precautions to reduce risk of postprandial aspiration. No  SLP f/u needed, continue current diet and sign off.       CHL IP TREATMENT RECOMMENDATION 12/01/2014  Treatment Recommendations No treatment recommended  at this time     CHL IP DIET RECOMMENDATION 12/01/2014  SLP Diet Recommendations Thin;Age appropriate regular solids  Liquid Administration via (None)  Medication Administration Whole meds with liquid  Compensations Slow rate;Small sips/bites;Follow solids with liquid  Postural Changes and/or Swallow Maneuvers (None)     CHL IP OTHER RECOMMENDATIONS 12/01/2014  Recommended Consults (None)  Oral Care Recommendations Oral care BID  Other Recommendations (None)      CHL IP FOLLOW UP RECOMMENDATIONS 06/28/2014  Follow up Recommendations None     CHL IP FREQUENCY AND DURATION 11/30/2014  Speech Therapy Frequency (ACUTE ONLY) min 1 x/week  Treatment Duration 1 week     Pertinent Vitals/Pain NA   Herbie Baltimore, MA CCC-SLP [phone]  Lynann Beaver 12/01/2014, 12:41 PM    Admission HPI: Barry Lawrence is a 69 year old male with PMH significant for CKD Stage 4, A fib, CAD, PAD, AAA s/p aortobifemoral bypass in Jan 2016. Who presents to [facility] today with complaints of shortness of breath. He notes that yesterday he started feeling a little unwell and a bit nauseous when he was mowing his yard yesterday. When he went to bed last night he was starting ot have some bilateral flank pain and this morning he woke feeling very short of breath with chills. He notes that it was very difficult for him to take a deep breath because it caused right side flank pain. He denies any fevers at home. He denies any prolonged immobilization or calf/leg pain and is compliant with warfarin for A/C (for Afib). He does report chronic lower extremity swelling and reports that he takes Lasix twice a day. He has not missed doses of this and reports his weight has been stable at 187-188.  He lives in [place] and recieves most of his care at the [place] in [place]. His PCP is there as well as his nephrologist. He continues to smoke about a pack a day.  Hospital Course by problem list: Principal Problem:   HCAP (healthcare-associated pneumonia) Active Problems:   Carotid artery disease   COPD GOLD II still smoking    Cigarette smoker   CKD (chronic kidney disease), stage IV   Sleep apnea   Atrial fibrillation   Chronic diastolic CHF (congestive heart failure)   Sepsis   Acute on chronic diastolic heart failure   Pneumonia due to Klebsiella pneumoniae   HCAP (healthcare acquired pneumonia):  He was admitted to the step down unit due to his multiple medical problems and concerns he  could decompensate rapidly and possibly require BiPAP. He was initially started on CAP coverage with Ceftriaxone and Azithromycin upon admittance. However, upon further questioning he was last hospitalized in 09/2014 for cellulitis and was switched to HCAP coverage with Levaquin. Blood cultures grew gram negative rods overnight, Levaquin was stopped and switched to Zosyn. Improved symptomatically, maintained good BP and O2 sats. Cultures grew Kelbsiella sensitive to Ceftriaxone and Zosyn was stopped and Ceftriaxone restarted. Swallow evaluation was done with no signs of swallow dysfunction. WBC trended down from 17.9 to 12.3. He was transferred off the step down unit. Symptomatically improved. He was put on Keflex 500mg  BID for 7 days on discharge.   Gram negative Sepsis: Blood cultures grew gram negative rods, speciated to Klebsiella. Lactic acid was 1.27 on admission and 1.6 on repeat. U/A negative for leukocytes and nitrite. Urine culture showing no growth after 2 days. HIV neg and Strep neg. Legionella negative. Bisoprolol was stopped for bradycardia into 50s and  sepsis. Heart rated monitored throughout stay with regular rate. Initially started on Zosyn after G- rods grew on culture and switched to Ceftriaxone after speciation with sensitivities. Discharged on Keflex 500 mg BID for 7 days. Resumed on home Bisoprolol on discharge.   Chronic diastolic CHF (congestive heart failure)- Appeared fluid overloaded with 2+ pitting edema below the knee bilaterally and bibasilar crackles. Strict I&Os and daily weights were done. Blood cultures revealed G- sepsis and he had a drop in BP so lasix were held and his bisoprolol was stopped. The following morning his leg edema was worsened and weight was up 4 kg. Cr increased from 2.43 to 3.33. He maintained a good BP so IV lasix 40 mg was given and he had 3.4 L output over 24 hours. Cr improved to 2.51. He continued to have worsening leg edema and was given another dose  of IV lasix 40 mg with 2.6L output over 24 hours. Cr improved to 2.35. Given another dose of IV lasix 40 mg before discharge. Resumed his home PO lasix regimen on discharge.    Carotid artery disease- s/p CABG: He had no active chest pain, troponins were negative x3, EKG showed no signs of new ischemia and there was no suspicion for ACS. Home medications were continued during hospitalization and at discharge.   Atrial fibrillation: CHADSVasc at least 4. Coumadin was continued per pharmacy. He had a drop in BP and bradycardia into the 50s so bisoprolol was stopped. He had a rise in INR to 4.1. Coumadin was held for a day and INR returned to therapeutic range at 2.74. Coumadin was then restarted. INR dropped to 1.84 the following day and dose was adjusted per pharmacy. Coumadin was continued at discharge. Bisoprolol was continued at discharge.    COPD GOLD II still smoking. PT evaluation was done and patient required home O2 with activities. O2 sats here with ambulation- 82-87%. Spiriva and Albuterol nebs prn were ordered. He continued to have desaturation to 85% with ambulation on day of discharge. Discharged with home O2 3L with a prescription for an albuterol inhailer. Spiriva was continued at discharge.    Cigarette smoker: Continues to smoke. RN to provided smoking cessation education.   CKD (chronic kidney disease), stage IV: Cr 2.43 at admission. Rose to 3.33 after holding lasix. Improved to 2.35 after resuming lasix.   Sleep apnea: Refused CPAP  Discharge Vitals:   BP 162/80 mmHg  Pulse 84  Temp(Src) 97.8 F (36.6 C) (Oral)  Resp 18  Ht 5\' 7"  (1.702 m)  Wt 85.1 kg (187 lb 9.8 oz)  BMI 29.38 kg/m2  SpO2 97%  Discharge Labs:  Results for orders placed or performed during the hospital encounter of 11/29/14 (from the past 24 hour(s))  Protime-INR     Status: Abnormal   Collection Time: 12/03/14  3:31 AM  Result Value Ref Range   Prothrombin Time 21.6 (H) 11.6 - 15.2 seconds    INR 1.89 (H) 0.00 - 99991111  Basic metabolic panel     Status: Abnormal   Collection Time: 12/03/14  3:31 AM  Result Value Ref Range   Sodium 134 (L) 135 - 145 mmol/L   Potassium 3.9 3.5 - 5.1 mmol/L   Chloride 98 (L) 101 - 111 mmol/L   CO2 27 22 - 32 mmol/L   Glucose, Bld 96 65 - 99 mg/dL   BUN 44 (H) 6 - 20 mg/dL   Creatinine, Ser 2.35 (H) 0.61 - 1.24 mg/dL   Calcium 9.1 8.9 -  10.3 mg/dL   GFR calc non Af Amer 27 (L) >60 mL/min   GFR calc Af Amer 31 (L) >60 mL/min   Anion gap 9 5 - 15    Signed: Maryellen Pile, MD 12/03/2014, 3:21 PM

## 2014-12-02 NOTE — Progress Notes (Addendum)
Subjective: Notes some improvement in pain but still having some pain with deep inspiration. New bruise on right flank where he is having the pain. Does not recall any trauma. No shortness of breath or chills. Got IV lasix yesterday and had an output of 3.4L yesterday.Weight back to admission weight.  Objective: Vital signs in last 24 hours: Filed Vitals:   12/02/14 0749 12/02/14 0842 12/02/14 1136 12/02/14 1200  BP: 141/61 140/64 169/89 165/84  Pulse: 90  83 63  Temp: 98.2 F (36.8 C)  97.7 F (36.5 C)   TempSrc: Oral  Oral   Resp: 21  24 23   Height:      Weight:      SpO2: 95%  94% 92%   Weight change: -3 kg (-6 lb 9.8 oz)  Intake/Output Summary (Last 24 hours) at 12/02/14 1336 Last data filed at 12/02/14 1205  Gross per 24 hour  Intake    770 ml  Output   3275 ml  Net  -2505 ml   Physical Exam GENERAL- alert, co-operative, appears as stated age, not in any distress. CARDIAC- irregularly irregular rhythm, normal rate no murmurs, rubs or gallops. RESP- Moving equal volumes of air, and bibasilar crackles, worse on left , but no features of respiratory distress ABDOMEN- Soft, nontender, no guarding or rebound, no palpable masses or organomegaly, bowel sounds present. EXTREMITIES- diminished LE pulses 1+ bilaterally, 2+ pitting edema to knees bilaterally, worse compared to days prior. SKIN- Warm, dry, No rash or lesion. PSYCH- Normal mood and affect, appropriate thought content and speech.  Lab Results: Basic Metabolic Panel:  Recent Labs Lab 12/01/14 0251 12/02/14 1246  NA 128* 136  K 5.4* 4.3  CL 94* 100*  CO2 24 26  GLUCOSE 110* 109*  BUN 62* 52*  CREATININE 3.33* 2.51*  CALCIUM 9.1 9.3   Liver Function Tests:  Recent Labs Lab 11/29/14 0904  AST 17  ALT 16*  ALKPHOS 98  BILITOT 0.6  PROT 6.2*  ALBUMIN 3.4*   CBC:  Recent Labs Lab 11/29/14 0904 11/30/14 0221 12/01/14 0251  WBC 10.6* 18.3* 17.9*  NEUTROABS 8.6*  --   --   HGB 14.4 14.2 13.5    HCT 43.7 44.0 41.6  MCV 89.0 89.2 88.3  PLT 211 219 201   Hemoglobin A1C:  Recent Labs Lab 11/30/14 0221  HGBA1C 5.8*   Coagulation:  Recent Labs Lab 11/29/14 0904 11/30/14 0221 12/01/14 0251 12/02/14 0351  LABPROT 30.3* 33.9* 38.7* 28.6*  INR 2.95* 3.43* 4.10* 2.74*   Urinalysis:  Recent Labs Lab 11/29/14 1025  COLORURINE YELLOW  LABSPEC 1.016  PHURINE 5.0  GLUCOSEU NEGATIVE  HGBUR SMALL*  BILIRUBINUR NEGATIVE  KETONESUR NEGATIVE  PROTEINUR >300*  UROBILINOGEN 0.2  NITRITE NEGATIVE  LEUKOCYTESUR NEGATIVE   Micro Results: Recent Results (from the past 240 hour(s))  Culture, blood (routine x 2)     Status: None   Collection Time: 11/29/14 10:00 AM  Result Value Ref Range Status   Specimen Description BLOOD LEFT ANTECUBITAL  Final   Special Requests BOTTLES DRAWN AEROBIC AND ANAEROBIC 10CC  Final   Culture  Setup Time   Final    GRAM NEGATIVE RODS IN BOTH AEROBIC AND ANAEROBIC BOTTLES CRITICAL RESULT CALLED TO, READ BACK BY AND VERIFIED WITH: Lowella Bandy 07.08.16 2226 M SHIPMAN CONFIRMED BY A BROWNING    Culture KLEBSIELLA PNEUMONIAE  Final   Report Status 12/01/2014 FINAL  Final   Organism ID, Bacteria KLEBSIELLA PNEUMONIAE  Final  Susceptibility   Klebsiella pneumoniae - MIC*    AMPICILLIN >=32 RESISTANT Resistant     CEFAZOLIN <=4 SENSITIVE Sensitive     CEFEPIME <=1 SENSITIVE Sensitive     CEFTAZIDIME <=1 SENSITIVE Sensitive     CEFTRIAXONE <=1 SENSITIVE Sensitive     CIPROFLOXACIN <=0.25 SENSITIVE Sensitive     GENTAMICIN <=1 SENSITIVE Sensitive     IMIPENEM <=0.25 SENSITIVE Sensitive     TRIMETH/SULFA <=20 SENSITIVE Sensitive     AMPICILLIN/SULBACTAM 4 SENSITIVE Sensitive     PIP/TAZO <=4 SENSITIVE Sensitive     * KLEBSIELLA PNEUMONIAE  Culture, blood (routine x 2)     Status: None   Collection Time: 11/29/14 10:05 AM  Result Value Ref Range Status   Specimen Description BLOOD RIGHT ANTECUBITAL  Final   Special Requests BOTTLES DRAWN  AEROBIC AND ANAEROBIC 5CC  Final   Culture  Setup Time   Final    GRAM NEGATIVE RODS IN BOTH AEROBIC AND ANAEROBIC BOTTLES CRITICAL RESULT CALLED TO, READ BACK BY AND VERIFIED WITH: M ENNIS,RN 07.08.16 2226 M SHIPMAN CONFIRMED BY A BROWNING    Culture   Final    KLEBSIELLA PNEUMONIAE SUSCEPTIBILITIES PERFORMED ON PREVIOUS CULTURE WITHIN THE LAST 5 DAYS.    Report Status 12/01/2014 FINAL  Final  MRSA PCR Screening     Status: None   Collection Time: 11/29/14 12:27 PM  Result Value Ref Range Status   MRSA by PCR NEGATIVE NEGATIVE Final    Comment:        The GeneXpert MRSA Assay (FDA approved for NASAL specimens only), is one component of a comprehensive MRSA colonization surveillance program. It is not intended to diagnose MRSA infection nor to guide or monitor treatment for MRSA infections.   Culture, Urine     Status: None   Collection Time: 11/30/14  8:37 AM  Result Value Ref Range Status   Specimen Description URINE, RANDOM  Final   Special Requests NONE  Final   Culture NO GROWTH 1 DAY  Final   Report Status 12/01/2014 FINAL  Final   Studies/Results: Dg Swallowing Func-speech Pathology  12/01/2014    Objective Swallowing Evaluation:    Patient Details  Name: Barry Lawrence MRN: [id number] Date of Birth: 1945-11-17  Today's Date: 12/01/2014 Time: SLP Start Time (ACUTE ONLY): 1200-SLP Stop Time (ACUTE ONLY): 1215 SLP Time Calculation (min) (ACUTE ONLY): 15 min  Past Medical History:  Past Medical History  Diagnosis Date  . CAD (coronary artery disease)     myoview 04/21/11-normal, EF49%  . S/P CABG (coronary artery bypass graft) 1996  . Hyperlipemia   . Polycythemia     H/O  . GERD (gastroesophageal reflux disease)   . Tobacco abuse   . CKD (chronic kidney disease), stage III   . Myocardial infarction 1996  . OSA on CPAP     CPAP q night , CPAP is through [place], states doesn't remember when he had  the last study  . Pneumonia     hosp. as a child with pneumonia, not since   .  Diabetes     BORDERLINE  . Headache     uses [employer], states he is lessening what he is taking for prep.  for surgery   . Arthritis     in his back  . Hypertension     stress test- done 04/2014, followed by Dr. Gwenlyn Found  . Carotid stenosis 01/15/08    right endarterectomy-Dr. Amedeo Plenty  . PAD (peripheral artery disease) 06/12/09  a. 11/22/07 PTA & stenting right external iliac artery;bilateral iliac &  PTI and stenting 1997;right ICA =>50% reduction,right SFA >50%,left ICA at  stent => 50% reduction,left EIA => 50% reduction,left ATA occluded, left  SFA mid > 49% reduction;  03/2014 Angio: LRA 90, patent L Iliac stent and  distal RCIA stent, large saccular AAA.  [name] Abdominal aortic aneurysm 01/28/10; 02/05/14    4.3x4.6cm;  now measuring 5.5 cm, status post aortobifemoral bypass  grafting in January 2016 by Dr. problem along with left renal artery  bypass   Past Surgical History:  Past Surgical History  Procedure Laterality Date  . Carotid endarterectomy Right 01/15/08  . Lumbar disc surgery  1998  . Pv angio  11/22/2007    PTA/ stenting of right common iliac artery with a 10x10mm Smart stent  and postdilated with a 7x66mmpowerflex balloon; high grade calcified  stenosis of the RICA  . Colonoscopy N/A 07/09/2013    Procedure: COLONOSCOPY;  Surgeon: Juanita Craver, MD;  Location: [facility]  ENDOSCOPY;  Service: Endoscopy;  Laterality: N/A;  . Pv angio  04/01/14    AAA, Lt renal artery stenosis, patent iliacs  . Abdominal aortagram N/A 04/01/2014    Procedure: ABDOMINAL Maxcine Ham;  Surgeon: [name], MD;   Location: [facility] CATH LAB;  Service: Cardiovascular;  Laterality: N/A;  . Back surgery  1988    at [facility]  . Coronary artery bypass graft  1996    LIMA to LAD, sequential vein to OM1 and 2 as well as acure marginal  branch and diag branch  . Aorta - bilateral femoral artery bypass graft N/A 05/30/2014    Procedure: AORTOBIFEMORAL BYPASS GRAFT;  Surgeon: Serafina Mitchell, MD;   Location: [facility];  Service: Vascular;  Laterality: N/A;  .  Aortic/renal bypass Left 05/30/2014    Procedure: LEFT RENAL ARTERY BYPASS;  Surgeon: Serafina Mitchell, MD;   Location: [facility] OR;  Service: Vascular;  Laterality: Left;   HPI:  Other Pertinent Information: Pt is a 69 y/o male with PMH of CKD stage 4,  CAD, PAD, AAA, s/p aortobifemoral bypass in Jan 2016 who p/w SOB * 1 day.  Patient states that he was feeling nauseous and slightly unwell the day  PTA and woke up yesterday with SOB. It was assoc with b/l flank pain worse  on the right and chills. Pain was worse on coughing and taking a deep  breath. Chest x ray 11-29-14 showed right lower lobe opacity suspicious for  pneumonia.   No Data Recorded  Assessment / Plan / Recommendation CHL IP CLINICAL IMPRESSIONS 12/01/2014  Therapy Diagnosis Suspected primary esophageal dysphagia  Clinical Impression Pt demonstrates adequate oral and oropharyngeal  function with no penetration or aspiration observed. Esophageal sweep  revealed appearance of stasis in the mid to distal esophagus, slowly  clearing with thin liquids (no radiologist present to confirm). Discussed  basic esophageal precautions to reduce risk of postprandial aspiration. No  SLP f/u needed, continue current diet and sign off.       CHL IP TREATMENT RECOMMENDATION 12/01/2014  Treatment Recommendations No treatment recommended at this time     CHL IP DIET RECOMMENDATION 12/01/2014  SLP Diet Recommendations Thin;Age appropriate regular solids  Liquid Administration via (None)  Medication Administration Whole meds with liquid  Compensations Slow rate;Small sips/bites;Follow solids with liquid  Postural Changes and/or Swallow Maneuvers (None)     CHL IP OTHER RECOMMENDATIONS 12/01/2014  Recommended Consults (None)  Oral Care Recommendations Oral care BID  Other Recommendations (None)     CHL IP FOLLOW UP RECOMMENDATIONS 06/28/2014  Follow up Recommendations None     CHL IP FREQUENCY AND DURATION 11/30/2014  Speech Therapy Frequency (ACUTE ONLY) min 1 x/week  Treatment Duration 1  week     Pertinent Vitals/Pain NA   Herbie Baltimore, MA CCC-SLP [phone]  DeBlois, Katherene Ponto 12/01/2014, 12:41 PM    Medications: I have reviewed the patient's current medications. Scheduled Meds: . amLODipine  10 mg Oral QAC breakfast  . atorvastatin  80 mg Oral QHS  . cefTRIAXone (ROCEPHIN)  IV  2 g Intravenous Q24H  . guaiFENesin-dextromethorphan  5 mL Oral TID AC & HS  . pantoprazole  40 mg Oral Daily  . tiotropium  1 capsule Inhalation Daily  . warfarin  3 mg Oral ONCE-1800  . Warfarin - Pharmacist Dosing Inpatient   Does not apply q1800   Continuous Infusions:  PRN Meds:.albuterol, HYDROcodone-acetaminophen, promethazine Assessment/Plan: Principal Problem:   HCAP (healthcare-associated pneumonia) Active Problems:   Carotid artery disease   COPD GOLD II still smoking    Cigarette smoker   CKD (chronic kidney disease), stage IV   Sleep apnea   Atrial fibrillation   Chronic diastolic CHF (congestive heart failure)   Sepsis   HCAP (healthcare acquired pneumonia)- Last hospital admission- May for cellulitis. Blood cultures growing Kelbsiella. Appears to be improving symptomatically. - Monitor vitals- BP, O2 sats - Transfer off step down today. - Blood cultures growing Kelbsiella pneumoniae  - Stop Zosyn, start Ceftriaxone 2g qdaily - Swallow evaluation for possible aspiration PNA shows no swallowing dysfunction - WBC trending down. 17.9 on 7/10 to 12.3 today.  Gram negative Sepsis. Lactic acid WNL. HIV neg and Strep neg.  - Repeat Blood cultures yesterday to ensure clearance of bact from blood stream - Switch to Ceftriaxone - Bisoprolol, D/c for gram neg sepsis, watch Heart rate.  - U/A negative for leukocytes and nitirite, 3-6 WBCs, Urine culture pending - Legionella pending   Chronic diastolic CHF (congestive heart failure)- Appears fluid overloaded. - IV lasix - 40mg  once yesterday with 3.4 L output over 24 hours - Still appeared volume overloaded this  morning, will give another dose of IV lasix 40 mg and reassess tomorrow  - Cr improved. 2.51 from 3.33 7/10 - Check BMET in AM - Strict I&O - Daily weights   Carotid artery disease- s/p CABG - No active chest pain, initial troponin negative, EKG without any signs of new ischemia. - Continue home medications, do not suspect ACS.  Atrial fibrillation - CHADSVasc at least 4 - Continue coumadin per pharmacy - Stopped bisoprolol overnight 2/2 drop in BP   COPD GOLD II still smoking. PT eval- pt will require home O2 with activities- O2 sats here with ambulation- 82-87%. Likely improve as Pneumonia resolves. - Re-eval prior to discharge, order home o2. - Continue spirvia - Goal O2 sat 89-94% - Albuterol PRN   Cigarette smoker - Needs to stop smoking. - RN to provide smoking cessation education   CKD (chronic kidney disease), stage IV - SCr at 2.5 from 3.3 yesterday. Appears to be around his baseline from previous admissions.  - Will monitor daily   Sleep apnea - Refusing CPAP overnight  DVTPPx: Coumadin.  - Coumadin was held on 7/10 for supra-therapeutic INR of 4.1 - INR corrected to 2.74 today - Per pharmacy.  Diet: HH  Code Status: Full  Dispo: Disposition is deferred at this time, awaiting improvement of current medical problems.  Anticipated  discharge in approximately 1-2 day(s).   The patient does have a current PCP Christain Sacramento, MD) and does need an [facility] hospital follow-up appointment after discharge.  The patient does not have transportation limitations that hinder transportation to clinic appointments.   LOS: 3 days   Maryellen Pile, MD 12/02/2014, 1:36 PM

## 2014-12-02 NOTE — Progress Notes (Signed)
ANTICOAGULATION CONSULT NOTE - Follow Up Consult  Pharmacy Consult for Coumadin Indication: atrial fibrillation  Allergies  Allergen Reactions  . Niaspan [Niacin Er] Hives    Patient Measurements: Height: 5\' 7"  (170.2 cm) Weight: 187 lb 9.8 oz (85.1 kg) IBW/kg (Calculated) : 66.1 Heparin Dosing Weight:   Vital Signs: Temp: 97.7 F (36.5 C) (07/11 1136) Temp Source: Oral (07/11 1136) BP: 169/89 mmHg (07/11 1136) Pulse Rate: 83 (07/11 1136)  Labs:  Recent Labs  11/30/14 0221 12/01/14 0251 12/02/14 0351  HGB 14.2 13.5  --   HCT 44.0 41.6  --   PLT 219 201  --   LABPROT 33.9* 38.7* 28.6*  INR 3.43* 4.10* 2.74*  CREATININE 3.18* 3.33*  --     Estimated Creatinine Clearance: 22.1 mL/min (by C-G formula based on Cr of 3.33).   Medications:  Scheduled:  . amLODipine  10 mg Oral QAC breakfast  . atorvastatin  80 mg Oral QHS  . cefTRIAXone (ROCEPHIN)  IV  2 g Intravenous Q24H  . guaiFENesin-dextromethorphan  5 mL Oral TID AC & HS  . pantoprazole  40 mg Oral Daily  . tiotropium  1 capsule Inhalation Daily  . Warfarin - Pharmacist Dosing Inpatient   Does not apply q1800    Assessment: 69yo male on COumadin for AFib, dose held on 7/10 due to increased INR with antibiotics.  INR is corrected today to 2.74, Hg/pltc have been stable, no bleeding noted.  Goal of Therapy:  INR 2-3 Monitor platelets by anticoagulation protocol: Yes   Plan:  Coumadin 3mg  today Continue daily INR Watch for s/s of bleeding  Gracy Bruins, PharmD [facility]

## 2014-12-02 NOTE — Progress Notes (Signed)
Patient stated he will not be wearing CPAP while here in hospital.  RT explained the importance and patient understands.  RT will continue to monitor.

## 2014-12-02 NOTE — Progress Notes (Addendum)
Pt takes his nasal cannula off and has been educated not to continue he complains of shortness of breath RT called to give breathing treatment.O2 stats 94% on 3L  Pt color good lung sounds were diminished at bases Incentive Spirometer 1200 encouraged pt to use to improve lungs. Arthor Captain LPN

## 2014-12-03 DIAGNOSIS — J150 Pneumonia due to Klebsiella pneumoniae: Secondary | ICD-10-CM | POA: Insufficient documentation

## 2014-12-03 LAB — PROTIME-INR
INR: 1.89 — ABNORMAL HIGH (ref 0.00–1.49)
Prothrombin Time: 21.6 seconds — ABNORMAL HIGH (ref 11.6–15.2)

## 2014-12-03 LAB — BASIC METABOLIC PANEL
Anion gap: 9 (ref 5–15)
BUN: 44 mg/dL — ABNORMAL HIGH (ref 6–20)
CO2: 27 mmol/L (ref 22–32)
Calcium: 9.1 mg/dL (ref 8.9–10.3)
Chloride: 98 mmol/L — ABNORMAL LOW (ref 101–111)
Creatinine, Ser: 2.35 mg/dL — ABNORMAL HIGH (ref 0.61–1.24)
GFR calc Af Amer: 31 mL/min — ABNORMAL LOW (ref >60)
GFR calc non Af Amer: 27 mL/min — ABNORMAL LOW (ref >60)
Glucose, Bld: 96 mg/dL (ref 65–99)
Potassium: 3.9 mmol/L (ref 3.5–5.1)
Sodium: 134 mmol/L — ABNORMAL LOW (ref 135–145)

## 2014-12-03 MED ORDER — ALBUTEROL SULFATE HFA 108 (90 BASE) MCG/ACT IN AERS: 2.0000 | INHALATION_SPRAY | Freq: Four times a day (QID) | RESPIRATORY_TRACT | Status: DC | PRN

## 2014-12-03 MED ORDER — WARFARIN SODIUM 3 MG PO TABS
3.0000 mg | ORAL_TABLET | Freq: Once | ORAL | Status: DC
  Filled 2014-12-03: qty 1

## 2014-12-03 MED ORDER — FUROSEMIDE 10 MG/ML IJ SOLN
40.0000 mg | Freq: Once | INTRAMUSCULAR | Status: AC
  Administered 2014-12-03: 40 mg via INTRAVENOUS
  Filled 2014-12-03: qty 4

## 2014-12-03 MED ORDER — CEPHALEXIN 500 MG PO CAPS: 500.0000 mg | ORAL_CAPSULE | Freq: Two times a day (BID) | ORAL | Status: DC

## 2014-12-03 MED ORDER — BISOPROLOL FUMARATE 5 MG PO TABS
5.0000 mg | ORAL_TABLET | Freq: Every day | ORAL | Status: DC
  Administered 2014-12-03: 5 mg via ORAL
  Filled 2014-12-03: qty 1

## 2014-12-03 NOTE — Progress Notes (Signed)
Pt refusing continous pulse ox. Sats WNL. Will spot check with vital signs.

## 2014-12-03 NOTE — Progress Notes (Signed)
Subjective: Having some SOB with ambulation. Feels improved from admission but does not know if he feels any better from the last few days. Does say leg edema is improved.   Objective: Vital signs in last 24 hours: Filed Vitals:   12/02/14 1300 12/02/14 2104 12/02/14 2140 12/03/14 0529  BP: 164/73 169/86  152/76  Pulse: 87 88  74  Temp: 97.7 F (36.5 C) 98.3 F (36.8 C)  97.9 F (36.6 C)  TempSrc: Oral Oral  Oral  Resp: 22 22  18   Height:      Weight:      SpO2: 95% 94% 95% 96%   Weight change:   Intake/Output Summary (Last 24 hours) at 12/03/14 [id number] Last data filed at 12/03/14 0500  Gross per 24 hour  Intake   1130 ml  Output   2650 ml  Net  -1520 ml   Physical Exam GENERAL- alert, co-operative, appears as stated age, not in any distress. CARDIAC- irregularly irregular rhythm, normal rate, no murmurs, rubs or gallops. RESP- Moving equal volumes of air, and clear to auscultation bilaterally, mild expiratory wheezes but no crackles. ABDOMEN- Soft, nontender, no guarding or rebound, no palpable masses or organomegaly, bowel sounds present. EXTREMITIES- diminished LE pulses 1+ bilaterally, 1+ to 2+ pitting edema to knees bilaterally, improved compared to days prior. SKIN- Warm, dry, No rash or lesion. PSYCH- Normal mood and affect, appropriate thought content and speech.  Lab Results: Basic Metabolic Panel:  Recent Labs Lab 12/02/14 1246 12/03/14 0331  NA 136 134*  K 4.3 3.9  CL 100* 98*  CO2 26 27  GLUCOSE 109* 96  BUN 52* 44*  CREATININE 2.51* 2.35*  CALCIUM 9.3 9.1   Liver Function Tests:  Recent Labs Lab 11/29/14 0904  AST 17  ALT 16*  ALKPHOS 98  BILITOT 0.6  PROT 6.2*  ALBUMIN 3.4*   CBC:  Recent Labs Lab 11/29/14 0904  12/01/14 0251 12/02/14 1303  WBC 10.6*  < > 17.9* 12.3*  NEUTROABS 8.6*  --   --   --   HGB 14.4  < > 13.5 14.9  HCT 43.7  < > 41.6 45.2  MCV 89.0  < > 88.3 89.3  PLT 211  < > 201 224  < > = values in this interval  not displayed.  Hemoglobin A1C:  Recent Labs Lab 11/30/14 0221  HGBA1C 5.8*   Coagulation:  Recent Labs Lab 11/30/14 0221 12/01/14 0251 12/02/14 0351 12/03/14 0331  LABPROT 33.9* 38.7* 28.6* 21.6*  INR 3.43* 4.10* 2.74* 1.89*   Urinalysis:  Recent Labs Lab 11/29/14 1025  COLORURINE YELLOW  LABSPEC 1.016  PHURINE 5.0  GLUCOSEU NEGATIVE  HGBUR SMALL*  BILIRUBINUR NEGATIVE  KETONESUR NEGATIVE  PROTEINUR >300*  UROBILINOGEN 0.2  NITRITE NEGATIVE  LEUKOCYTESUR NEGATIVE   Micro Results: Recent Results (from the past 240 hour(s))  Culture, blood (routine x 2)     Status: None   Collection Time: 11/29/14 10:00 AM  Result Value Ref Range Status   Specimen Description BLOOD LEFT ANTECUBITAL  Final   Special Requests BOTTLES DRAWN AEROBIC AND ANAEROBIC 10CC  Final   Culture  Setup Time   Final    GRAM NEGATIVE RODS IN BOTH AEROBIC AND ANAEROBIC BOTTLES CRITICAL RESULT CALLED TO, READ BACK BY AND VERIFIED WITH: Lowella Bandy 07.08.16 2226 M SHIPMAN CONFIRMED BY A BROWNING    Culture KLEBSIELLA PNEUMONIAE  Final   Report Status 12/01/2014 FINAL  Final   Organism ID, Bacteria KLEBSIELLA PNEUMONIAE  Final      Susceptibility   Klebsiella pneumoniae - MIC*    AMPICILLIN >=32 RESISTANT Resistant     CEFAZOLIN <=4 SENSITIVE Sensitive     CEFEPIME <=1 SENSITIVE Sensitive     CEFTAZIDIME <=1 SENSITIVE Sensitive     CEFTRIAXONE <=1 SENSITIVE Sensitive     CIPROFLOXACIN <=0.25 SENSITIVE Sensitive     GENTAMICIN <=1 SENSITIVE Sensitive     IMIPENEM <=0.25 SENSITIVE Sensitive     TRIMETH/SULFA <=20 SENSITIVE Sensitive     AMPICILLIN/SULBACTAM 4 SENSITIVE Sensitive     PIP/TAZO <=4 SENSITIVE Sensitive     * KLEBSIELLA PNEUMONIAE  Culture, blood (routine x 2)     Status: None   Collection Time: 11/29/14 10:05 AM  Result Value Ref Range Status   Specimen Description BLOOD RIGHT ANTECUBITAL  Final   Special Requests BOTTLES DRAWN AEROBIC AND ANAEROBIC 5CC  Final   Culture   Setup Time   Final    GRAM NEGATIVE RODS IN BOTH AEROBIC AND ANAEROBIC BOTTLES CRITICAL RESULT CALLED TO, READ BACK BY AND VERIFIED WITH: M ENNIS,RN 07.08.16 2226 M SHIPMAN CONFIRMED BY A BROWNING    Culture   Final    KLEBSIELLA PNEUMONIAE SUSCEPTIBILITIES PERFORMED ON PREVIOUS CULTURE WITHIN THE LAST 5 DAYS.    Report Status 12/01/2014 FINAL  Final  MRSA PCR Screening     Status: None   Collection Time: 11/29/14 12:27 PM  Result Value Ref Range Status   MRSA by PCR NEGATIVE NEGATIVE Final    Comment:        The GeneXpert MRSA Assay (FDA approved for NASAL specimens only), is one component of a comprehensive MRSA colonization surveillance program. It is not intended to diagnose MRSA infection nor to guide or monitor treatment for MRSA infections.   Culture, Urine     Status: None   Collection Time: 11/30/14  8:37 AM  Result Value Ref Range Status   Specimen Description URINE, RANDOM  Final   Special Requests NONE  Final   Culture NO GROWTH 1 DAY  Final   Report Status 12/01/2014 FINAL  Final  Culture, blood (routine x 2)     Status: None (Preliminary result)   Collection Time: 12/01/14  2:13 PM  Result Value Ref Range Status   Specimen Description RIGHT ANTECUBITAL  Final   Special Requests BOTTLES DRAWN AEROBIC AND ANAEROBIC 10CC  Final   Culture NO GROWTH 1 DAY  Final   Report Status PENDING  Incomplete  Culture, blood (routine x 2)     Status: None (Preliminary result)   Collection Time: 12/01/14  2:15 PM  Result Value Ref Range Status   Specimen Description BLOOD RIGHT WRIST  Final   Special Requests BOTTLES DRAWN AEROBIC AND ANAEROBIC 10CC  Final   Culture NO GROWTH 1 DAY  Final   Report Status PENDING  Incomplete   Studies/Results: Dg Swallowing Func-speech Pathology  12/01/2014    Objective Swallowing Evaluation:    Patient Details  Name: Barry Lawrence MRN: [place]:[id number] Date of Birth: 1945-11-16  Today's Date: 12/01/2014 Time: SLP Start Time (ACUTE ONLY):  1200-SLP Stop Time (ACUTE ONLY): 1215 SLP Time Calculation (min) (ACUTE ONLY): 15 min  Past Medical History:  Past Medical History  Diagnosis Date  . CAD (coronary artery disease)     myoview 04/21/11-normal, EF49%  . S/P CABG (coronary artery bypass graft) 1996  . Hyperlipemia   . Polycythemia     H/O  . GERD (gastroesophageal reflux disease)   .  Tobacco abuse   . CKD (chronic kidney disease), stage III   . Myocardial infarction 1996  . OSA on CPAP     CPAP q night , CPAP is through [place], states doesn't remember when he had  the last study  . Pneumonia     hosp. as a child with pneumonia, not since   . Diabetes     BORDERLINE  . Headache     uses [employer], states he is lessening what he is taking for prep.  for surgery   . Arthritis     in his back  . Hypertension     stress test- done 04/2014, followed by Dr. Gwenlyn Found  . Carotid stenosis 01/15/08    right endarterectomy-Dr. Amedeo Plenty  . PAD (peripheral artery disease) 06/12/09    a. 11/22/07 PTA & stenting right external iliac artery;bilateral iliac &  PTI and stenting 1997;right ICA =>50% reduction,right SFA >50%,left ICA at  stent => 50% reduction,left EIA => 50% reduction,left ATA occluded, left  SFA mid > 49% reduction;  03/2014 Angio: LRA 90, patent L Iliac stent and  distal RCIA stent, large saccular AAA.  [name] Abdominal aortic aneurysm 01/28/10; 02/05/14    4.3x4.6cm;  now measuring 5.5 cm, status post aortobifemoral bypass  grafting in January 2016 by Dr. problem along with left renal artery  bypass   Past Surgical History:  Past Surgical History  Procedure Laterality Date  . Carotid endarterectomy Right 01/15/08  . Lumbar disc surgery  1998  . Pv angio  11/22/2007    PTA/ stenting of right common iliac artery with a 10x74mm Smart stent  and postdilated with a 7x5mmpowerflex balloon; high grade calcified  stenosis of the RICA  . Colonoscopy N/A 07/09/2013    Procedure: COLONOSCOPY;  Surgeon: Juanita Craver, MD;  Location: [facility]  ENDOSCOPY;  Service: Endoscopy;  Laterality: N/A;   . Pv angio  04/01/14    AAA, Lt renal artery stenosis, patent iliacs  . Abdominal aortagram N/A 04/01/2014    Procedure: ABDOMINAL Maxcine Ham;  Surgeon: [name], MD;   Location: [facility] CATH LAB;  Service: Cardiovascular;  Laterality: N/A;  . Back surgery  1988    at [facility]  . Coronary artery bypass graft  1996    LIMA to LAD, sequential vein to OM1 and 2 as well as acure marginal  branch and diag branch  . Aorta - bilateral femoral artery bypass graft N/A 05/30/2014    Procedure: AORTOBIFEMORAL BYPASS GRAFT;  Surgeon: Serafina Mitchell, MD;   Location: [place];  Service: Vascular;  Laterality: N/A;  . Aortic/renal bypass Left 05/30/2014    Procedure: LEFT RENAL ARTERY BYPASS;  Surgeon: Serafina Mitchell, MD;   Location: [facility] OR;  Service: Vascular;  Laterality: Left;   HPI:  Other Pertinent Information: Pt is a 69 y/o male with PMH of CKD stage 4,  CAD, PAD, AAA, s/p aortobifemoral bypass in Jan 2016 who p/w SOB * 1 day.  Patient states that he was feeling nauseous and slightly unwell the day  PTA and woke up yesterday with SOB. It was assoc with b/l flank pain worse  on the right and chills. Pain was worse on coughing and taking a deep  breath. Chest x ray 11-29-14 showed right lower lobe opacity suspicious for  pneumonia.   No Data Recorded  Assessment / Plan / Recommendation CHL IP CLINICAL IMPRESSIONS 12/01/2014  Therapy Diagnosis Suspected primary esophageal dysphagia  Clinical Impression Pt demonstrates adequate oral and oropharyngeal  function with no penetration or aspiration observed. Esophageal sweep  revealed appearance of stasis in the mid to distal esophagus, slowly  clearing with thin liquids (no radiologist present to confirm). Discussed  basic esophageal precautions to reduce risk of postprandial aspiration. No  SLP f/u needed, continue current diet and sign off.       CHL IP TREATMENT RECOMMENDATION 12/01/2014  Treatment Recommendations No treatment recommended at this time     CHL IP DIET RECOMMENDATION 12/01/2014   SLP Diet Recommendations Thin;Age appropriate regular solids  Liquid Administration via (None)  Medication Administration Whole meds with liquid  Compensations Slow rate;Small sips/bites;Follow solids with liquid  Postural Changes and/or Swallow Maneuvers (None)     CHL IP OTHER RECOMMENDATIONS 12/01/2014  Recommended Consults (None)  Oral Care Recommendations Oral care BID  Other Recommendations (None)     CHL IP FOLLOW UP RECOMMENDATIONS 06/28/2014  Follow up Recommendations None     CHL IP FREQUENCY AND DURATION 11/30/2014  Speech Therapy Frequency (ACUTE ONLY) min 1 x/week  Treatment Duration 1 week     Pertinent Vitals/Pain NA   Herbie Baltimore, MA CCC-SLP [phone]  DeBlois, Katherene Ponto 12/01/2014, 12:41 PM    Medications: I have reviewed the patient's current medications. Scheduled Meds: . amLODipine  10 mg Oral QAC breakfast  . atorvastatin  80 mg Oral QHS  . cefTRIAXone (ROCEPHIN)  IV  2 g Intravenous Q24H  . guaiFENesin-dextromethorphan  5 mL Oral TID AC & HS  . pantoprazole  40 mg Oral Daily  . tiotropium  1 capsule Inhalation Daily  . Warfarin - Pharmacist Dosing Inpatient   Does not apply q1800   Continuous Infusions:  PRN Meds:.albuterol, HYDROcodone-acetaminophen, promethazine Assessment/Plan: Principal Problem:   HCAP (healthcare-associated pneumonia) Active Problems:   Carotid artery disease   COPD GOLD II still smoking    Cigarette smoker   CKD (chronic kidney disease), stage IV   Sleep apnea   Atrial fibrillation   Chronic diastolic CHF (congestive heart failure)   Sepsis   Acute on chronic diastolic heart failure  HCAP (healthcare acquired pneumonia)- Last hospital admission- May for cellulitis. Blood cultures growing Kelbsiella. Appears to be improving symptomatically. - Monitor vitals- BP, O2 sats - Blood cultures growing Kelbsiella pneumoniae  - Continue Ceftriaxone 2g today, switch to Keflex on discharge - Swallow evaluation for possible aspiration PNA shows no  swallowing dysfunction - WBC trending down. 17.9 on 7/10 to 12.3 7/11.  Gram negative Sepsis. Lactic acid WNL. HIV neg and Strep neg.  - Repeat Blood cultures showing no growth at 1 day - Ceftriaxone, switch to Keflex on discharge - Restart Bisoprolol, HR and BP stable - U/A negative for leukocytes and nitirite, 3-6 WBCs, Urine culture pending - Legionella negative  Chronic diastolic CHF (congestive heart failure)- Appears fluid overloaded. - IV lasix - 40mg  once yesterday with 2.6 L output over 24 hours - Leg edema improved this AM, will give another dose of IV lasix today and restart home PO Lasix on discharge - Cr improved. 2.35 from 3.33 7/10 - Strict I&O - Daily weights   Carotid artery disease- s/p CABG - No active chest pain, initial troponin negative, EKG without any signs of new ischemia. - Continue home medications, do not suspect ACS.   Atrial fibrillation - CHADSVasc at least 4 - Continue coumadin per pharmacy - Restart bisoprolol    COPD GOLD II still smoking. PT eval- pt will require home O2 with activities- O2 sats here with ambulation- 82-87%. Likely improve  as Pneumonia resolves. - Desat to 85% with ambulation, will need home O2 on discharge - Continue spirvia - Goal O2 sat 89-94% - Albuterol PRN   Cigarette smoker - Needs to stop smoking. - RN to provide smoking cessation education   CKD (chronic kidney disease), stage IV - SCr at 2.35 from 2.51 yesterday. Appears to be around his baseline from previous admissions.  - Will monitor daily   Sleep apnea - Refusing CPAP overnight  DVTPPx: Coumadin.  - Coumadin was held on 7/10 for supra-therapeutic INR of 4.1 - INR overcorrected to 1.89 today - Per pharmacy.  Diet: HH  Code Status: Full  Dispo: Anticipate discharge today  The patient does have a current PCP Christain Sacramento, MD) and does need an [facility] hospital follow-up appointment after discharge.  The patient does not have transportation  limitations that hinder transportation to clinic appointments.   LOS: 4 days   Maryellen Pile, MD 12/03/2014, 7:02 AM

## 2014-12-03 NOTE — Progress Notes (Signed)
SATURATION QUALIFICATIONS: (This note is used to comply with regulatory documentation for home oxygen)  Patient Saturations on Room Air at Rest = 91%  Patient Saturations on Room Air while Ambulating = 85%  Patient Saturations on 3 Liters of oxygen while Ambulating = 93  Please briefly explain why patient needs home oxygen:

## 2014-12-03 NOTE — Care Management Note (Signed)
Case Management Note  Patient Details  Name: Barry Lawrence MRN: [place]:[id number] Date of Birth: 1945-12-29  Subjective/Objective:       HCAP             Action/Plan: Home  Expected Discharge Date:      12/03/2014           Expected Discharge Plan:  Home/Self Care  In-House Referral:     Discharge planning Services  CM Consult  Post Acute Care Choice:    Choice offered to:     DME Arranged:  Oxygen DME Agency:  [facility].  Status of Service:  Completed, signed off  Medicare Important Message Given:  Yes-second notification given Date Medicare IM Given:    Medicare IM give by:    Date Additional Medicare IM Given:    Additional Medicare Important Message give by:     If discussed at [place] of Stay Meetings, dates discussed:    Additional Comments: NCM spoke to pt and gave permission to speak to wife. Pt states he has Medicare D to cover his medications or he uses [place]. He has a PCP at the [place] and will call to arrange follow up with his PCP. Contacted [facility] for DME for oxygen. Explained [facility] will bring a portable tank to the room and when deliver a concentrator once he arrives home.  Erenest Rasher, RN 12/03/2014, 2:49 PM

## 2014-12-03 NOTE — Discharge Instructions (Signed)
Resume your home dose lasix regimen tomorrow. We have given you a prescription for Keflex to treat your pneumonia. Take it twice a day until it runs out.  You have a follow up appointment with your PCP on July 21st at 1:45

## 2014-12-03 NOTE — Progress Notes (Signed)
Pt found asleep with his oxygen off O2 stats 93% educated pt to not remove nasal cannula. Will continue to monitor. Arthor Captain LPN

## 2014-12-03 NOTE — Progress Notes (Signed)
Internal Medicine Attending  Date: 12/03/2014  Patient name: [name] Medical record number: [id number] Date of birth: 01/31/1946 Age: 69 y.o. Gender: male  I saw and evaluated the patient. I reviewed the resident's note by Dr. Charlynn Grimes and I agree with the resident's findings and plans as documented in his progress note.  When Barry Lawrence was seen on rounds this morning he felt he was close to his baseline and did not feel any further hospital stay would improve him symptomatically. He does require home oxygen therapy with ambulation and at night. This can be reassessed one month after discharge when he has the follow-up chest x-ray to assure clearing. He has also improved with intravenous diuresis and we will give him 1 more dose today prior to discharge. He will be placed back on his home medicine regimen as well as complete a course of oral Keflex for his Klebsiella pneumonia and bacteremia. I agree with the plans to discharge him home today.

## 2014-12-03 NOTE — Progress Notes (Signed)
Barry Lawrence to be D/C'd Home per MD order.  Discussed with the patient and all questions fully answered.  IV catheter discontinued intact. Site without signs and symptoms of complications. Dressing and pressure applied.  An After Visit Summary was printed and given to the patient. Patient received home O2 from [facility]. Prescriptions called in to patient's pharmacy.  D/c education completed with patient/family including follow up instructions, medication list, d/c activities limitations if indicated, with other d/c instructions as indicated by MD - patient able to verbalize understanding, all questions fully answered.   Patient instructed to return to ED, call 911, or call MD for any changes in condition.   Patient escorted via [place], and D/C home via private auto.  Micki Riley 12/03/2014 3:40 PM

## 2014-12-03 NOTE — Progress Notes (Signed)
ANTICOAGULATION CONSULT NOTE - Follow Up Consult  Pharmacy Consult for Coumadin Indication: atrial fibrillation  Allergies  Allergen Reactions  . Niaspan [Niacin Er] Hives    Patient Measurements: Height: 5\' 7"  (170.2 cm) Weight:  (Bed malfunction. Could not weigh) IBW/kg (Calculated) : 66.1  Vital Signs: Temp: 97.9 F (36.6 C) (07/12 0529) Temp Source: Oral (07/12 0529) BP: 152/76 mmHg (07/12 0529) Pulse Rate: 74 (07/12 0529)  Labs:  Recent Labs  12/01/14 0251 12/02/14 0351 12/02/14 1246 12/02/14 1303 12/03/14 0331  HGB 13.5  --   --  14.9  --   HCT 41.6  --   --  45.2  --   PLT 201  --   --  224  --   LABPROT 38.7* 28.6*  --   --  21.6*  INR 4.10* 2.74*  --   --  1.89*  CREATININE 3.33*  --  2.51*  --  2.35*    Estimated Creatinine Clearance: 31.4 mL/min (by C-G formula based on Cr of 2.35).   Assessment: 69yo male on Coumadin for AFib. Course complicated by Kleb bacteremia and PNA. Patient received 5mg  dose on 7/8, then 1mg  dose on 7/9, held on 7/10, and then given 3mg  on 7/11. INR has been trending down after peaking at 4.1 on antibiotics. Current INR is subtherapeutic at 1.89. No bleeding noted.  PTA Coumadin dose is 5mg  daily. (admitted with therapeutic INR)  Goal of Therapy:  INR 2-3 Monitor platelets by anticoagulation protocol: Yes   Plan:  Coumadin 3mg  today Monitor daily INR, CBC, s/s of bleed  Ilyaas Musto J 12/03/2014,7:25 AM

## 2014-12-05 ENCOUNTER — Telehealth: Payer: Self-pay | Admitting: Family Medicine

## 2014-12-05 ENCOUNTER — Other Ambulatory Visit: Payer: Self-pay | Admitting: Internal Medicine

## 2014-12-05 DIAGNOSIS — J9601 Acute respiratory failure with hypoxia: Secondary | ICD-10-CM

## 2014-12-06 LAB — CULTURE, BLOOD (ROUTINE X 2)
Culture: NO GROWTH
Culture: NO GROWTH

## 2014-12-09 ENCOUNTER — Ambulatory Visit (INDEPENDENT_AMBULATORY_CARE_PROVIDER_SITE_OTHER): Payer: Medicare Other | Admitting: *Deleted

## 2014-12-09 DIAGNOSIS — I480 Paroxysmal atrial fibrillation: Secondary | ICD-10-CM | POA: Diagnosis not present

## 2014-12-09 DIAGNOSIS — I4891 Unspecified atrial fibrillation: Secondary | ICD-10-CM

## 2014-12-09 DIAGNOSIS — Z7901 Long term (current) use of anticoagulants: Secondary | ICD-10-CM | POA: Diagnosis not present

## 2014-12-09 LAB — POCT INR: INR: 2.7

## 2014-12-09 MED ORDER — WARFARIN SODIUM 5 MG PO TABS: 5.0000 mg | ORAL_TABLET | Freq: Once | ORAL | Status: DC

## 2014-12-12 ENCOUNTER — Emergency Department (HOSPITAL_COMMUNITY): Payer: Medicare Other

## 2014-12-12 ENCOUNTER — Encounter (HOSPITAL_COMMUNITY): Payer: Self-pay | Admitting: *Deleted

## 2014-12-12 ENCOUNTER — Inpatient Hospital Stay (HOSPITAL_COMMUNITY)
Admission: EM | Admit: 2014-12-12 | Discharge: 2014-12-20 | DRG: 187 | Disposition: A | Discharge status: Home / Self Care | Payer: Medicare Other | Attending: Internal Medicine | Admitting: Internal Medicine

## 2014-12-12 DIAGNOSIS — N179 Acute kidney failure, unspecified: Secondary | ICD-10-CM | POA: Diagnosis present

## 2014-12-12 DIAGNOSIS — I119 Hypertensive heart disease without heart failure: Secondary | ICD-10-CM | POA: Diagnosis present

## 2014-12-12 DIAGNOSIS — I4891 Unspecified atrial fibrillation: Secondary | ICD-10-CM | POA: Diagnosis present

## 2014-12-12 DIAGNOSIS — Z7901 Long term (current) use of anticoagulants: Secondary | ICD-10-CM

## 2014-12-12 DIAGNOSIS — R0600 Dyspnea, unspecified: Secondary | ICD-10-CM | POA: Diagnosis not present

## 2014-12-12 DIAGNOSIS — I2510 Atherosclerotic heart disease of native coronary artery without angina pectoris: Secondary | ICD-10-CM | POA: Diagnosis present

## 2014-12-12 DIAGNOSIS — R310 Gross hematuria: Secondary | ICD-10-CM | POA: Diagnosis not present

## 2014-12-12 DIAGNOSIS — K5900 Constipation, unspecified: Secondary | ICD-10-CM | POA: Diagnosis present

## 2014-12-12 DIAGNOSIS — Z7982 Long term (current) use of aspirin: Secondary | ICD-10-CM | POA: Diagnosis not present

## 2014-12-12 DIAGNOSIS — M25551 Pain in right hip: Secondary | ICD-10-CM | POA: Diagnosis not present

## 2014-12-12 DIAGNOSIS — Z9889 Other specified postprocedural states: Secondary | ICD-10-CM | POA: Diagnosis not present

## 2014-12-12 DIAGNOSIS — I6521 Occlusion and stenosis of right carotid artery: Secondary | ICD-10-CM | POA: Diagnosis present

## 2014-12-12 DIAGNOSIS — Z79899 Other long term (current) drug therapy: Secondary | ICD-10-CM

## 2014-12-12 DIAGNOSIS — J948 Other specified pleural conditions: Secondary | ICD-10-CM | POA: Diagnosis present

## 2014-12-12 DIAGNOSIS — Z8679 Personal history of other diseases of the circulatory system: Secondary | ICD-10-CM | POA: Diagnosis not present

## 2014-12-12 DIAGNOSIS — I739 Peripheral vascular disease, unspecified: Secondary | ICD-10-CM | POA: Diagnosis present

## 2014-12-12 DIAGNOSIS — I714 Abdominal aortic aneurysm, without rupture, unspecified: Secondary | ICD-10-CM | POA: Diagnosis present

## 2014-12-12 DIAGNOSIS — Z9689 Presence of other specified functional implants: Secondary | ICD-10-CM

## 2014-12-12 DIAGNOSIS — Z978 Presence of other specified devices: Secondary | ICD-10-CM | POA: Diagnosis not present

## 2014-12-12 DIAGNOSIS — J918 Pleural effusion in other conditions classified elsewhere: Secondary | ICD-10-CM

## 2014-12-12 DIAGNOSIS — J449 Chronic obstructive pulmonary disease, unspecified: Secondary | ICD-10-CM | POA: Diagnosis present

## 2014-12-12 DIAGNOSIS — N184 Chronic kidney disease, stage 4 (severe): Secondary | ICD-10-CM | POA: Diagnosis present

## 2014-12-12 DIAGNOSIS — I5032 Chronic diastolic (congestive) heart failure: Secondary | ICD-10-CM | POA: Diagnosis present

## 2014-12-12 DIAGNOSIS — G4730 Sleep apnea, unspecified: Secondary | ICD-10-CM | POA: Diagnosis present

## 2014-12-12 DIAGNOSIS — E875 Hyperkalemia: Secondary | ICD-10-CM | POA: Diagnosis not present

## 2014-12-12 DIAGNOSIS — I130 Hypertensive heart and chronic kidney disease with heart failure and stage 1 through stage 4 chronic kidney disease, or unspecified chronic kidney disease: Secondary | ICD-10-CM | POA: Diagnosis present

## 2014-12-12 DIAGNOSIS — E785 Hyperlipidemia, unspecified: Secondary | ICD-10-CM | POA: Diagnosis present

## 2014-12-12 DIAGNOSIS — F17210 Nicotine dependence, cigarettes, uncomplicated: Secondary | ICD-10-CM | POA: Diagnosis present

## 2014-12-12 DIAGNOSIS — J90 Pleural effusion, not elsewhere classified: Secondary | ICD-10-CM | POA: Diagnosis present

## 2014-12-12 DIAGNOSIS — R001 Bradycardia, unspecified: Secondary | ICD-10-CM | POA: Diagnosis present

## 2014-12-12 DIAGNOSIS — G4733 Obstructive sleep apnea (adult) (pediatric): Secondary | ICD-10-CM | POA: Diagnosis present

## 2014-12-12 DIAGNOSIS — I252 Old myocardial infarction: Secondary | ICD-10-CM

## 2014-12-12 DIAGNOSIS — D72829 Elevated white blood cell count, unspecified: Secondary | ICD-10-CM | POA: Diagnosis not present

## 2014-12-12 DIAGNOSIS — Z951 Presence of aortocoronary bypass graft: Secondary | ICD-10-CM

## 2014-12-12 DIAGNOSIS — J810 Acute pulmonary edema: Secondary | ICD-10-CM | POA: Diagnosis not present

## 2014-12-12 DIAGNOSIS — K219 Gastro-esophageal reflux disease without esophagitis: Secondary | ICD-10-CM | POA: Diagnosis present

## 2014-12-12 DIAGNOSIS — J189 Pneumonia, unspecified organism: Secondary | ICD-10-CM

## 2014-12-12 DIAGNOSIS — R0902 Hypoxemia: Secondary | ICD-10-CM | POA: Diagnosis present

## 2014-12-12 DIAGNOSIS — J939 Pneumothorax, unspecified: Secondary | ICD-10-CM

## 2014-12-12 LAB — CBC WITH DIFFERENTIAL/PLATELET
Basophils Absolute: 0.1 10*3/uL (ref 0.0–0.1)
Basophils Relative: 1 % (ref 0–1)
Eosinophils Absolute: 0.1 10*3/uL (ref 0.0–0.7)
Eosinophils Relative: 1 % (ref 0–5)
HCT: 45.3 % (ref 39.0–52.0)
Hemoglobin: 15 g/dL (ref 13.0–17.0)
Lymphocytes Relative: 12 % (ref 12–46)
Lymphs Abs: 1.3 10*3/uL (ref 0.7–4.0)
MCH: 29.3 pg (ref 26.0–34.0)
MCHC: 33.1 g/dL (ref 30.0–36.0)
MCV: 88.5 fL (ref 78.0–100.0)
Monocytes Absolute: 0.9 10*3/uL (ref 0.1–1.0)
Monocytes Relative: 8 % (ref 3–12)
Neutro Abs: 8.5 10*3/uL — ABNORMAL HIGH (ref 1.7–7.7)
Neutrophils Relative %: 78 % — ABNORMAL HIGH (ref 43–77)
Platelets: 329 10*3/uL (ref 150–400)
RBC: 5.12 MIL/uL (ref 4.22–5.81)
RDW: 15 % (ref 11.5–15.5)
WBC: 10.9 10*3/uL — ABNORMAL HIGH (ref 4.0–10.5)

## 2014-12-12 LAB — COMPREHENSIVE METABOLIC PANEL
ALT: 16 U/L — ABNORMAL LOW (ref 17–63)
AST: 18 U/L (ref 15–41)
Albumin: 3.4 g/dL — ABNORMAL LOW (ref 3.5–5.0)
Alkaline Phosphatase: 101 U/L (ref 38–126)
Anion gap: 13 (ref 5–15)
BUN: 48 mg/dL — ABNORMAL HIGH (ref 6–20)
CO2: 23 mmol/L (ref 22–32)
Calcium: 9.6 mg/dL (ref 8.9–10.3)
Chloride: 98 mmol/L — ABNORMAL LOW (ref 101–111)
Creatinine, Ser: 2.78 mg/dL — ABNORMAL HIGH (ref 0.61–1.24)
GFR calc Af Amer: 25 mL/min — ABNORMAL LOW (ref >60)
GFR calc non Af Amer: 22 mL/min — ABNORMAL LOW (ref >60)
Glucose, Bld: 93 mg/dL (ref 65–99)
Potassium: 4.9 mmol/L (ref 3.5–5.1)
Sodium: 134 mmol/L — ABNORMAL LOW (ref 135–145)
Total Bilirubin: 0.6 mg/dL (ref 0.3–1.2)
Total Protein: 7.7 g/dL (ref 6.5–8.1)

## 2014-12-12 LAB — PROCALCITONIN: Procalcitonin: 0.1 ng/mL

## 2014-12-12 LAB — BRAIN NATRIURETIC PEPTIDE: B Natriuretic Peptide: 413.2 pg/mL — ABNORMAL HIGH (ref 0.0–100.0)

## 2014-12-12 LAB — PROTIME-INR
INR: 2.65 — ABNORMAL HIGH (ref 0.00–1.49)
Prothrombin Time: 27.9 seconds — ABNORMAL HIGH (ref 11.6–15.2)

## 2014-12-12 MED ORDER — ALBUTEROL SULFATE (2.5 MG/3ML) 0.083% IN NEBU: 2.5000 mg | INHALATION_SOLUTION | Freq: Once | RESPIRATORY_TRACT | Status: DC

## 2014-12-12 MED ORDER — ACETAMINOPHEN 650 MG RE SUPP: 650.0000 mg | Freq: Four times a day (QID) | RECTAL | Status: DC | PRN

## 2014-12-12 MED ORDER — AMLODIPINE BESYLATE 10 MG PO TABS
10.0000 mg | ORAL_TABLET | Freq: Every day | ORAL | Status: DC
  Administered 2014-12-13 – 2014-12-20 (×7): 10 mg via ORAL
  Filled 2014-12-12 (×10): qty 1

## 2014-12-12 MED ORDER — ATORVASTATIN CALCIUM 80 MG PO TABS
80.0000 mg | ORAL_TABLET | Freq: Every day | ORAL | Status: DC
  Administered 2014-12-12 – 2014-12-19 (×8): 80 mg via ORAL
  Filled 2014-12-12 (×10): qty 1

## 2014-12-12 MED ORDER — SODIUM CHLORIDE 0.9 % IJ SOLN
3.0000 mL | Freq: Two times a day (BID) | INTRAMUSCULAR | Status: DC
  Administered 2014-12-12 – 2014-12-20 (×15): 3 mL via INTRAVENOUS

## 2014-12-12 MED ORDER — POTASSIUM CHLORIDE CRYS ER 20 MEQ PO TBCR
20.0000 meq | EXTENDED_RELEASE_TABLET | Freq: Every day | ORAL | Status: DC
  Administered 2014-12-13 – 2014-12-16 (×4): 20 meq via ORAL
  Filled 2014-12-12 (×7): qty 1

## 2014-12-12 MED ORDER — FUROSEMIDE 20 MG PO TABS
60.0000 mg | ORAL_TABLET | Freq: Every morning | ORAL | Status: DC
  Filled 2014-12-12: qty 1

## 2014-12-12 MED ORDER — FUROSEMIDE 40 MG PO TABS
40.0000 mg | ORAL_TABLET | Freq: Every day | ORAL | Status: DC
  Filled 2014-12-12: qty 1

## 2014-12-12 MED ORDER — PANTOPRAZOLE SODIUM 40 MG PO TBEC
40.0000 mg | DELAYED_RELEASE_TABLET | Freq: Every day | ORAL | Status: DC
  Administered 2014-12-12 – 2014-12-20 (×8): 40 mg via ORAL
  Filled 2014-12-12 (×8): qty 1

## 2014-12-12 MED ORDER — ALBUTEROL SULFATE (2.5 MG/3ML) 0.083% IN NEBU: 2.5000 mg | INHALATION_SOLUTION | RESPIRATORY_TRACT | Status: DC | PRN

## 2014-12-12 MED ORDER — ACETAMINOPHEN 325 MG PO TABS
650.0000 mg | ORAL_TABLET | Freq: Four times a day (QID) | ORAL | Status: DC | PRN
  Administered 2014-12-12 – 2014-12-15 (×4): 650 mg via ORAL
  Administered 2014-12-17: 325 mg via ORAL
  Filled 2014-12-12 (×5): qty 2

## 2014-12-12 MED ORDER — CARVEDILOL 12.5 MG PO TABS
12.5000 mg | ORAL_TABLET | Freq: Every day | ORAL | Status: DC
  Filled 2014-12-12: qty 1

## 2014-12-12 MED ORDER — FUROSEMIDE 40 MG PO TABS
40.0000 mg | ORAL_TABLET | Freq: Once | ORAL | Status: AC
  Administered 2014-12-13: 40 mg via ORAL
  Filled 2014-12-12: qty 1

## 2014-12-12 MED ORDER — TIOTROPIUM BROMIDE MONOHYDRATE 2.5 MCG/ACT IN AERS: 2.0000 | INHALATION_SPRAY | Freq: Every day | RESPIRATORY_TRACT | Status: DC

## 2014-12-12 NOTE — Procedures (Signed)
Pt refuses to wear CPAP.  °

## 2014-12-12 NOTE — ED Notes (Signed)
Patient transported to CT 

## 2014-12-12 NOTE — ED Provider Notes (Signed)
CSN: [id number]     Arrival date & time 12/12/14  1627 History   First MD Initiated Contact with Patient 12/12/14 1630     Chief Complaint  Patient presents with  . Pleural Effusion     (Consider location/radiation/quality/duration/timing/severity/associated sxs/prior Treatment) The history is provided by the patient.   patient presents with somewhat worsening shortness of breath after recent pneumonia admission. Was seen as an outpatient day had had a large pleural effusion on the right side. This side of the pneumonia. Patient has had a little bit of cough. States his breathing has worsened a little bit. He is on oxygen which she has been on in the past. No fevers. No chills. States he has worsening swelling in his legs also.  Past Medical History  Diagnosis Date  . CAD (coronary artery disease)     myoview 04/21/11-normal, EF49%  . S/P CABG (coronary artery bypass graft) 1996  . Hyperlipemia   . Polycythemia     H/O  . GERD (gastroesophageal reflux disease)   . Tobacco abuse   . CKD (chronic kidney disease), stage III   . Myocardial infarction 1996  . OSA on CPAP     CPAP q night , CPAP is through [place], states doesn't remember when he had the last study  . Pneumonia     hosp. as a child with pneumonia, not since   . Diabetes     BORDERLINE  . Headache     uses [employer], states he is lessening what he is taking for prep. for surgery   . Arthritis     in his back  . Hypertension     stress test- done 04/2014, followed by Dr. Gwenlyn Found  . Carotid stenosis 01/15/08    right endarterectomy-Dr. Amedeo Plenty  . PAD (peripheral artery disease) 06/12/09    a. 11/22/07 PTA & stenting right external iliac artery;bilateral iliac & PTI and stenting 1997;right ICA =>50% reduction,right SFA >50%,left ICA at stent => 50% reduction,left EIA => 50% reduction,left ATA occluded, left SFA mid > 49% reduction;  03/2014 Angio: LRA 90, patent L Iliac stent and distal RCIA stent, large saccular AAA.  [name]  Abdominal aortic aneurysm 01/28/10; 02/05/14    4.3x4.6cm;  now measuring 5.5 cm, status post aortobifemoral bypass grafting in January 2016 by Dr. problem along with left renal artery bypass   Past Surgical History  Procedure Laterality Date  . Carotid endarterectomy Right 01/15/08  . Lumbar disc surgery  1998  . Pv angio  11/22/2007    PTA/ stenting of right common iliac artery with a 10x18mm Smart stent and postdilated with a 7x65mmpowerflex balloon; high grade calcified stenosis of the RICA  . Colonoscopy N/A 07/09/2013    Procedure: COLONOSCOPY;  Surgeon: Juanita Craver, MD;  Location: [facility];  Service: Endoscopy;  Laterality: N/A;  . Pv angio  04/01/14    AAA, Lt renal artery stenosis, patent iliacs  . Abdominal aortagram N/A 04/01/2014    Procedure: ABDOMINAL Maxcine Ham;  Surgeon: [name], MD;  Location: [facility] CATH LAB;  Service: Cardiovascular;  Laterality: N/A;  . Back surgery  1988    at [facility]  . Coronary artery bypass graft  1996    LIMA to LAD, sequential vein to OM1 and 2 as well as acure marginal branch and diag branch  . Aorta - bilateral femoral artery bypass graft N/A 05/30/2014    Procedure: AORTOBIFEMORAL BYPASS GRAFT;  Surgeon: Serafina Mitchell, MD;  Location: [place];  Service: Vascular;  Laterality: N/A;  . Aortic/renal bypass Left 05/30/2014    Procedure: LEFT RENAL ARTERY BYPASS;  Surgeon: Serafina Mitchell, MD;  Location: [facility] OR;  Service: Vascular;  Laterality: Left;   Family History  Problem Relation Age of Onset  . Heart failure Father    History  Substance Use Topics  . Smoking status: Current Every Day Smoker -- 1.00 packs/day for 56 years    Types: Cigarettes  . Smokeless tobacco: Never Used  . Alcohol Use: No    Review of Systems  Constitutional: Negative for fever and chills.  HENT: Negative for dental problem.   Respiratory: Positive for cough and shortness of breath.   Cardiovascular: Positive for leg swelling. Negative for chest pain.   Gastrointestinal: Negative for abdominal pain.  Genitourinary: Negative for flank pain.  Musculoskeletal: Negative for back pain.  Skin: Negative for wound.  Neurological: Negative for headaches.  Hematological: Negative for adenopathy.      Allergies  Niaspan  Home Medications   Prior to Admission medications   Medication Sig Start Date End Date Taking? Authorizing Provider  albuterol (PROVENTIL HFA;VENTOLIN HFA) 108 (90 BASE) MCG/ACT inhaler Inhale 2 puffs into the lungs every 6 (six) hours as needed for wheezing or shortness of breath. 12/03/14  Yes Maryellen Pile, MD  amLODipine (NORVASC) 10 MG tablet Take 10 mg by mouth daily before breakfast.    Yes Historical Provider, MD  Aspirin-Acetaminophen-Caffeine (GOODY HEADACHE PO) as needed.   Yes Historical Provider, MD  atorvastatin (LIPITOR) 80 MG tablet Take 80 mg by mouth at bedtime.    Yes Historical Provider, MD  bisoprolol (ZEBETA) 5 MG tablet Take 5 mg by mouth daily. 11/11/14  Yes Historical Provider, MD  carvedilol (COREG) 12.5 MG tablet Take 12.5 mg by mouth daily. 11/30/14  Yes Historical Provider, MD  furosemide (LASIX) 40 MG tablet Take by mouth 2 (two) times daily. 60 mg q am and 40 mg q pm   Yes Historical Provider, MD  omeprazole (PRILOSEC) 20 MG capsule Take 20 mg by mouth at bedtime.    Yes Historical Provider, MD  potassium chloride SA (K-DUR,KLOR-CON) 20 MEQ tablet Take 20 mEq by mouth daily.   Yes Historical Provider, MD  warfarin (COUMADIN) 5 MG tablet Take 1 tablet (5 mg total) by mouth one time only at 6 PM. 12/09/14  Yes [name], MD  Tiotropium Bromide Monohydrate (SPIRIVA RESPIMAT) 2.5 MCG/ACT AERS Inhale 2 puffs into the lungs daily. Patient not taking: Reported on 12/12/2014 11/11/14   Tanda Rockers, MD   BP 154/71 mmHg  Pulse 60  Temp(Src) 97.6 F (36.4 C) (Oral)  Resp 21  Ht 5\' 7"  (1.702 m)  Wt 183 lb (83.008 kg)  BMI 28.66 kg/m2  SpO2 98% Physical Exam  Constitutional: He appears  well-developed and well-nourished.  HENT:  Head: Normocephalic.  Eyes: EOM are normal.  Cardiovascular: Normal rate.   Pulmonary/Chest:  Somewhat harsh breath sounds throughout with decreased breath sounds in right base.  Abdominal: Soft. Bowel sounds are normal. There is no tenderness.  Musculoskeletal: He exhibits edema.  Moderate pitting edema to bilateral lower extremities.  Neurological: He is alert.  Skin: Skin is warm.    ED Course  Procedures (including critical care time) Labs Review Labs Reviewed  CBC WITH DIFFERENTIAL/PLATELET - Abnormal; Notable for the following:    WBC 10.9 (*)    Neutrophils Relative % 78 (*)    Neutro Abs 8.5 (*)    All other components within normal  limits  PROTIME-INR - Abnormal; Notable for the following:    Prothrombin Time 27.9 (*)    INR 2.65 (*)    All other components within normal limits  COMPREHENSIVE METABOLIC PANEL - Abnormal; Notable for the following:    Sodium 134 (*)    Chloride 98 (*)    BUN 48 (*)    Creatinine, Ser 2.78 (*)    Albumin 3.4 (*)    ALT 16 (*)    GFR calc non Af Amer 22 (*)    GFR calc Af Amer 25 (*)    All other components within normal limits    Imaging Review Ct Chest Wo Contrast  12/12/2014   CLINICAL DATA:  Shortness of breath. Large right pleural effusion on chest radiographs earlier today. Recently treated with antibiotics for pneumonia with no symptomatic improvement.  EXAM: CT CHEST WITHOUT CONTRAST  TECHNIQUE: Multidetector CT imaging of the chest was performed following the standard protocol without IV contrast.  COMPARISON:  Chest radiographs obtained earlier today and chest CT dated 09/04/2014.  FINDINGS: Large right pleural effusion. Consolidation and volume loss involving the right lower lobe and right middle lobe and, to a lesser degree, the right upper lobe. Clear left lung. Air bronchograms with no visible mass and no enlarged lymph nodes.  Atheromatous arterial calcifications, including the  coronary arteries. Post CABG changes. The ascending thoracic aorta is currently just below the upper limit of normal with a transverse diameter of 3.9 cm on image number 22 at the level of the main pulmonary artery, previously 4.2 cm.  Gallstones in the gallbladder measuring up to 2.0 cm in maximum diameter each. No gallbladder wall thickening or pericholecystic fluid seen. No significant change in previously demonstrated bilateral fat density containing adrenal masses. The mass on the left currently measures 3.1 x 2.7 cm in the mass on the right currently measures 1.7 x 1.6 cm.  IMPRESSION: 1. Large right pleural effusion. 2. Compressive atelectasis and possible pneumonia involving the right lower lobe, right middle lobe and a portion of the right upper lobe. 3. No visible mass or adenopathy. 4. Cholelithiasis. 5. Stable bilateral adrenal myelolipomas.   Electronically Signed   By: Claudie Revering M.D.   On: 12/12/2014 18:06     EKG Interpretation   Date/Time:  Thursday December 12 2014 16:34:47 EDT Ventricular Rate:  54 PR Interval:    QRS Duration: 135 QT Interval:  484 QTC Calculation: 459 R Axis:   3 Text Interpretation:  Atrial fibrillation Right bundle branch block  Abnormal T, consider ischemia, lateral leads Baseline wander in lead(s) V2  rate decreased since last tracing Confirmed by Westley Blass  MD, Ovid Curd  [phone]) on 12/12/2014 5:02:36 PM      MDM   Final diagnoses:  Parapneumonic effusion    Patient with shortness of breath with large effusion on right side. May be parapneumonic. Mild hypoxia. Will admit to telemetry. Will likely need drainage.    Davonna Belling, MD 12/12/14 [phone]

## 2014-12-12 NOTE — ED Notes (Signed)
Pt arrives from [facility] at [place]. Pt was treated with antibiotics for pneumonia with no improvement. Pt cxr from today shows a right sided pneumothorax. Pt O2 sat on RA is 90%. Pt has a hx of COPD and states he is normally around 93% on RA.

## 2014-12-12 NOTE — H&P (Signed)
Date: 12/12/2014               Patient Name:  Barry Lawrence MRN: [id number]  DOB: 04-13-1946 Age / Sex: 69 y.o., male   PCP: Christain Sacramento, MD         Medical Service: Internal Medicine Teaching Service         Attending Physician: Dr. Aldine Contes, MD    First Contact: Dr. Charlynn Grimes Pager: [phone]  Second Contact: Dr. Denton Brick Pager: [phone]       After Hours (After 5p/  First Contact Pager: [phone]  weekends / holidays): Second Contact Pager: [phone]   Chief Complaint: SOB  History of Present Illness: Barry Lawrence is a 69 year old male with PMH significant for CKD Stage 4, A fib, CAD, PAD, AAA s/p aortobifemoral bypass in Jan 2016.  He was recently admitted to [facility] from 7/8-7/12 for treatment of right-sided pneumonia and Klebsiella pneumonia bacteremia sensitive to CTX.  He was discharged on 3L [place] and Keflex, which he finished on 7/20.  Post-discharge f/u CXR at his PCP 7/21 noted a large right pleural effusion.  He reports no improvement in his overall feeling, and worsening SOB. He was satting 90% on RA.  He reports satting 93% on RA since discharge.  He endorses the inability to lie flat, which is common for him.  He continues to endorse weakness, fatigue, and nausea.  He endorses cough productive of white sputum.  He denies changes in leg swelling, fever, chills, chest pain, vomiting, constipation, diarrhea, or night sweats.   He is a 1 ppd smoker for >60 years.  Meds: Current Facility-Administered Medications  Medication Dose Route Frequency Provider Last Rate Last Dose  . acetaminophen (TYLENOL) tablet 650 mg  650 mg Oral Q6H PRN Jones Bales, MD   650 mg at 12/12/14 2155   Or  . acetaminophen (TYLENOL) suppository 650 mg  650 mg Rectal Q6H PRN Jones Bales, MD      . albuterol (PROVENTIL) (2.5 MG/3ML) 0.083% nebulizer solution 2.5 mg  2.5 mg Nebulization Q2H PRN Jones Bales, MD      . Derrill Memo ON 12/13/2014] amLODipine (NORVASC) tablet 10 mg  10 mg Oral QAC  breakfast Jones Bales, MD      . atorvastatin (LIPITOR) tablet 80 mg  80 mg Oral QHS Jones Bales, MD   80 mg at 12/12/14 2117  . [START ON 12/13/2014] carvedilol (COREG) tablet 12.5 mg  12.5 mg Oral Daily Jones Bales, MD      . Derrill Memo ON 12/13/2014] furosemide (LASIX) tablet 40 mg  40 mg Oral QPC supper Jones Bales, MD      . Derrill Memo ON 12/13/2014] furosemide (LASIX) tablet 60 mg  60 mg Oral q morning - 10a Jones Bales, MD      . pantoprazole (PROTONIX) EC tablet 40 mg  40 mg Oral Daily Jones Bales, MD   40 mg at 12/12/14 2117  . [START ON 12/13/2014] potassium chloride SA (K-DUR,KLOR-CON) CR tablet 20 mEq  20 mEq Oral Daily Jones Bales, MD      . sodium chloride 0.9 % injection 3 mL  3 mL Intravenous Q12H Jones Bales, MD   3 mL at 12/12/14 2117  . Tiotropium Bromide Monohydrate AERS 2 puff  2 puff Inhalation Daily Jones Bales, MD   2 puff at 12/12/14 2015    Allergies: Allergies as of 12/12/2014 - Review Complete 12/12/2014  Allergen Reaction Noted  . Niaspan [niacin er] Hives 07/15/2012   Past Medical History  Diagnosis Date  . CAD (coronary artery disease)     myoview 04/21/11-normal, EF49%  . S/P CABG (coronary artery bypass graft) 1996  . Hyperlipemia   . Polycythemia     H/O  . GERD (gastroesophageal reflux disease)   . Tobacco abuse   . CKD (chronic kidney disease), stage III   . Myocardial infarction 1996  . OSA on CPAP     CPAP q night , CPAP is through [place], states doesn't remember when he had the last study  . Pneumonia     hosp. as a child with pneumonia, not since   . Diabetes     BORDERLINE  . Headache     uses [employer], states he is lessening what he is taking for prep. for surgery   . Arthritis     in his back  . Hypertension     stress test- done 04/2014, followed by Dr. Gwenlyn Found  . Carotid stenosis 01/15/08    right endarterectomy-Dr. Amedeo Plenty  . PAD (peripheral artery disease) 06/12/09    a. 11/22/07 PTA & stenting right  external iliac artery;bilateral iliac & PTI and stenting 1997;right ICA =>50% reduction,right SFA >50%,left ICA at stent => 50% reduction,left EIA => 50% reduction,left ATA occluded, left SFA mid > 49% reduction;  03/2014 Angio: LRA 90, patent L Iliac stent and distal RCIA stent, large saccular AAA.  [name] Abdominal aortic aneurysm 01/28/10; 02/05/14    4.3x4.6cm;  now measuring 5.5 cm, status post aortobifemoral bypass grafting in January 2016 by Dr. problem along with left renal artery bypass   Past Surgical History  Procedure Laterality Date  . Carotid endarterectomy Right 01/15/08  . Lumbar disc surgery  1998  . Pv angio  11/22/2007    PTA/ stenting of right common iliac artery with a 10x46mm Smart stent and postdilated with a 7x35mmpowerflex balloon; high grade calcified stenosis of the RICA  . Colonoscopy N/A 07/09/2013    Procedure: COLONOSCOPY;  Surgeon: Juanita Craver, MD;  Location: [facility];  Service: Endoscopy;  Laterality: N/A;  . Pv angio  04/01/14    AAA, Lt renal artery stenosis, patent iliacs  . Abdominal aortagram N/A 04/01/2014    Procedure: ABDOMINAL Maxcine Ham;  Surgeon: [name], MD;  Location: [facility] CATH LAB;  Service: Cardiovascular;  Laterality: N/A;  . Back surgery  1988    at [facility]  . Coronary artery bypass graft  1996    LIMA to LAD, sequential vein to OM1 and 2 as well as acure marginal branch and diag branch  . Aorta - bilateral femoral artery bypass graft N/A 05/30/2014    Procedure: AORTOBIFEMORAL BYPASS GRAFT;  Surgeon: Serafina Mitchell, MD;  Location: [place];  Service: Vascular;  Laterality: N/A;  . Aortic/renal bypass Left 05/30/2014    Procedure: LEFT RENAL ARTERY BYPASS;  Surgeon: Serafina Mitchell, MD;  Location: [facility] OR;  Service: Vascular;  Laterality: Left;   Family History  Problem Relation Age of Onset  . Heart failure Father    History   Social History  . Marital Status: Married    Spouse Name: N/A  . Number of Children: 2  . Years of Education: N/A    Occupational History  . Retired Estate manager/land agent    Social History Main Topics  . Smoking status: Current Every Day Smoker -- 1.00 packs/day for 56 years    Types: Cigarettes  .  Smokeless tobacco: Never Used  . Alcohol Use: No  . Drug Use: No  . Sexual Activity: Not on file   Other Topics Concern  . Not on file   Social History Narrative   Lives with wife.    Review of Systems: Pertinent items are noted in HPI.  Physical Exam: Blood pressure 136/81, pulse 63, temperature 98.2 F (36.8 C), temperature source Oral, resp. rate 18, height 5\' 7"  (1.702 m), weight 182 lb 6.4 oz (82.736 kg), SpO2 98 %. Physical Exam  Constitutional: He is oriented to person, place, and time and well-developed, well-nourished, and in no distress. No distress.  HENT:  Head: Normocephalic and atraumatic.  Eyes: EOM are normal.  Neck: Normal range of motion. No JVD present. No tracheal deviation present.  Cardiovascular:  Bradycardic.  Irregularly irregular rhythm. No murmurs appreciated.  Pulmonary/Chest: Effort normal. No respiratory distress.  Wheezes appreciated in left anterior and posterior fields.  Decreased breath sounds over right lower and middle lobes.  No crackles appreciated.  Abdominal: Soft. Bowel sounds are normal. He exhibits no distension. There is no tenderness. There is no rebound and no guarding.  Musculoskeletal:  2+ edema to knee bilaterally  Neurological: He is alert and oriented to person, place, and time.  Skin: Skin is warm and dry. No rash noted. He is not diaphoretic.     Lab results: Basic Metabolic Panel:  Recent Labs  12/12/14 1710  NA 134*  K 4.9  CL 98*  CO2 23  GLUCOSE 93  BUN 48*  CREATININE 2.78*  CALCIUM 9.6   Liver Function Tests:  Recent Labs  12/12/14 1710  AST 18  ALT 16*  ALKPHOS 101  BILITOT 0.6  PROT 7.7  ALBUMIN 3.4*   No results for input(s): LIPASE, AMYLASE in the last 72 hours. No results for input(s): AMMONIA in the last  72 hours. CBC:  Recent Labs  12/12/14 1710  WBC 10.9*  NEUTROABS 8.5*  HGB 15.0  HCT 45.3  MCV 88.5  PLT 329   Cardiac Enzymes: No results for input(s): CKTOTAL, CKMB, CKMBINDEX, TROPONINI in the last 72 hours. BNP: No results for input(s): PROBNP in the last 72 hours. D-Dimer: No results for input(s): DDIMER in the last 72 hours. CBG: No results for input(s): GLUCAP in the last 72 hours. Hemoglobin A1C: No results for input(s): HGBA1C in the last 72 hours. Fasting Lipid Panel: No results for input(s): CHOL, HDL, LDLCALC, TRIG, CHOLHDL, LDLDIRECT in the last 72 hours. Thyroid Function Tests: No results for input(s): TSH, T4TOTAL, FREET4, T3FREE, THYROIDAB in the last 72 hours. Anemia Panel: No results for input(s): VITAMINB12, FOLATE, FERRITIN, TIBC, IRON, RETICCTPCT in the last 72 hours. Coagulation:  Recent Labs  12/12/14 1710  LABPROT 27.9*  INR 2.65*   Urine Drug Screen: Drugs of Abuse  No results found for: LABOPIA, COCAINSCRNUR, LABBENZ, AMPHETMU, THCU, LABBARB  Alcohol Level: No results for input(s): ETH in the last 72 hours. Urinalysis: No results for input(s): COLORURINE, LABSPEC, PHURINE, GLUCOSEU, HGBUR, BILIRUBINUR, KETONESUR, PROTEINUR, UROBILINOGEN, NITRITE, LEUKOCYTESUR in the last 72 hours.  Invalid input(s): APPERANCEUR Misc. Labs: Procalcitonin: pending BNP: 413   Imaging results:  Ct Chest Wo Contrast  12/12/2014   CLINICAL DATA:  Shortness of breath. Large right pleural effusion on chest radiographs earlier today. Recently treated with antibiotics for pneumonia with no symptomatic improvement.  EXAM: CT CHEST WITHOUT CONTRAST  TECHNIQUE: Multidetector CT imaging of the chest was performed following the standard protocol without IV contrast.  COMPARISON:  Chest radiographs obtained earlier today and chest CT dated 09/04/2014.  FINDINGS: Large right pleural effusion. Consolidation and volume loss involving the right lower lobe and right middle  lobe and, to a lesser degree, the right upper lobe. Clear left lung. Air bronchograms with no visible mass and no enlarged lymph nodes.  Atheromatous arterial calcifications, including the coronary arteries. Post CABG changes. The ascending thoracic aorta is currently just below the upper limit of normal with a transverse diameter of 3.9 cm on image number 22 at the level of the main pulmonary artery, previously 4.2 cm.  Gallstones in the gallbladder measuring up to 2.0 cm in maximum diameter each. No gallbladder wall thickening or pericholecystic fluid seen. No significant change in previously demonstrated bilateral fat density containing adrenal masses. The mass on the left currently measures 3.1 x 2.7 cm in the mass on the right currently measures 1.7 x 1.6 cm.  IMPRESSION: 1. Large right pleural effusion. 2. Compressive atelectasis and possible pneumonia involving the right lower lobe, right middle lobe and a portion of the right upper lobe. 3. No visible mass or adenopathy. 4. Cholelithiasis. 5. Stable bilateral adrenal myelolipomas.   Electronically Signed   By: Claudie Revering M.D.   On: 12/12/2014 18:06    Other results: EKG: atrial fibrillation, rate 54, RBBB.  Assessment & Plan by Problem: Principal Problem:   Parapneumonic effusion Active Problems:   CAD (coronary artery disease)   COPD GOLD II still smoking    CKD (chronic kidney disease), stage IV   Sleep apnea   Atrial fibrillation   Chronic diastolic CHF (congestive heart failure)  Mr. Hardwell is a 69 year old male with PMH significant for CKD Stage 4, A fib, CAD, PAD, AAA s/p aortobifemoral bypass in Jan 2016.  Pleural effusion: Previous PNA with blood cultures growing Klebsiella sensitive to CTX. WBC today 10.9 (from 12.3 on 7/11).  Patient finished antibiotic course that should have treated the PNA.   Though effusion likely parapneumonic, could also be empyema, malignant effusion (60+ pack-year history), or effusion 2/2 CHF  (although unilateral). Patient appears stable without respiratory distress.  Will defer antibiotics at this time until fluid can be sampled.    - Hold antibiotics for now - IR for chest tube placement - Blood cultures - Effusion studies: pH, LDH, protein, glucose, culture, cell count, gram stain - INR currently 2.6. Will hold warfarin tonight and defer to IR for possible reversal.  Chronic diastolic CHF (congestive heart failure): No crackles appreciated on exam, though legs are edematous and patient feels bloated.  Weight today 83 kg, from 85 kg on discharge.  Patient reports compliance with lasix regimen.   - Lasix 60 mg PO qAM and 40qPM - Strict I&O - Daily weights - Amlodipine 10mg    AKI on CKD (chronic kidney disease), stage IV: SCr at 2.78 from 2.35 on 7/11. Current cause unclear.  Could either be pre-renal 2/2 poor fluid intake while ill, or 2/2 CHF.  Will continue home Lasix regimen PO for now and monitor Cr - Will monitor daily  Carotid artery disease- s/p CABG - No active chest pain, initial troponin negative, EKG without any signs of new ischemia. - Continue home medications, do not suspect ACS. - Atorva 80mg  - Amlodipine 10mg   Atrial fibrillation: INR 2.65. Rate controlled on Bisoprolol and Carvedilol at home, though bradycardic  - CHADSVasc at least 4 - Continue coumadin per pharmacy - Hold bisoprolol  - Continue carvedilol  COPD GOLD II still smoking. PT  eval as pt will require home O2 with activities.  - Will need home O2 on discharge - Continue Spirvia - Goal O2 sat 89-94% - Albuterol PRN  Cigarette smoker - Needs to stop smoking. - RN to provide smoking cessation education  Sleep apnea - CPAP overnight  FEN/GI: - HH - Protonix  DVTPPx: Coumadin - Per pharmacy.  Dispo: Disposition is deferred at this time, awaiting improvement of current medical problems.   The patient does have a current PCP Christain Sacramento, MD) and does need an [facility] hospital follow-up  appointment after discharge.  The patient does not have transportation limitations that hinder transportation to clinic appointments.  Signed: Iline Oven, MD, PhD 12/12/2014, 11:26 PM

## 2014-12-12 NOTE — Progress Notes (Signed)
ANTICOAGULATION CONSULT NOTE - Initial Consult  Pharmacy Consult for Warfarin  Indication: atrial fibrillation  Allergies  Allergen Reactions  . Niaspan [Niacin Er] Hives    Patient Measurements: Height: 5\' 7"  (170.2 cm) Weight: 182 lb 6.4 oz (82.736 kg) IBW/kg (Calculated) : 66.1  Vital Signs: Temp: 98.2 F (36.8 C) (07/21 2100) Temp Source: Oral (07/21 2100) BP: 136/81 mmHg (07/21 2100) Pulse Rate: 63 (07/21 2100)  Labs:  Recent Labs  12/12/14 1710  HGB 15.0  HCT 45.3  PLT 329  LABPROT 27.9*  INR 2.65*  CREATININE 2.78*    Estimated Creatinine Clearance: 26.2 mL/min (by C-G formula based on Cr of 2.78).   Medical History: Past Medical History  Diagnosis Date  . CAD (coronary artery disease)     myoview 04/21/11-normal, EF49%  . S/P CABG (coronary artery bypass graft) 1996  . Hyperlipemia   . Polycythemia     H/O  . GERD (gastroesophageal reflux disease)   . Tobacco abuse   . CKD (chronic kidney disease), stage III   . Myocardial infarction 1996  . OSA on CPAP     CPAP q night , CPAP is through [place], states doesn't remember when he had the last study  . Pneumonia     hosp. as a child with pneumonia, not since   . Diabetes     BORDERLINE  . Headache     uses [employer], states he is lessening what he is taking for prep. for surgery   . Arthritis     in his back  . Hypertension     stress test- done 04/2014, followed by Dr. Gwenlyn Found  . Carotid stenosis 01/15/08    right endarterectomy-Dr. Amedeo Plenty  . PAD (peripheral artery disease) 06/12/09    a. 11/22/07 PTA & stenting right external iliac artery;bilateral iliac & PTI and stenting 1997;right ICA =>50% reduction,right SFA >50%,left ICA at stent => 50% reduction,left EIA => 50% reduction,left ATA occluded, left SFA mid > 49% reduction;  03/2014 Angio: LRA 90, patent L Iliac stent and distal RCIA stent, large saccular AAA.  [name] Abdominal aortic aneurysm 01/28/10; 02/05/14    4.3x4.6cm;  now measuring 5.5 cm, status  post aortobifemoral bypass grafting in January 2016 by Dr. problem along with left renal artery bypass    Assessment: 69 y/o M here with large pleural effusion, on warfarin PTA for afib, INR is therapeutic at 2.65, IR being consulted per MD note for treatment of effusion, MD has requested we hold warfarin tonight.   Goal of Therapy:  INR 2-3 Monitor platelets by anticoagulation protocol: Yes   Plan:  -Hold warfarin tonight at MD request -F/U plans to resume warfarin on 7/22  Narda Bonds 12/12/2014,11:52 PM

## 2014-12-12 NOTE — Progress Notes (Signed)
Late entry:  Admission note:  Arrival Method: Pt arrived on stretcher from ED Mental Orientation: Alert and oriented x 4 Telemetry: Telemetry box 6e19 applied. CCMD notified. Pt running A-fib  Assessment: Completed, see doc flowsheets Skin: Dry and intact. No open areas noted.  IV: Left AC IV, saline locked. Site clean, dry and intact.  Pain: Pt states pain 7/10 in lower back. Medication administered.  Tubes: N/A Safety Measures: Bed in lowest position, non-slip socks placed, call light within reach Fall Prevention Safety Plan: Reviewed with patient Admission Screening: Completed, see doc flowsheets [phone] Orientation: Patient has been oriented to the unit, staff and to the room. Pt lying comfortably in bed with no further needs stated at this time. Orders have been reviewed and implemented. Call light within reach, will continue to monitor.   Shelbie Hutching, RN, BSN

## 2014-12-13 ENCOUNTER — Inpatient Hospital Stay (HOSPITAL_COMMUNITY): Payer: Medicare Other

## 2014-12-13 ENCOUNTER — Encounter (HOSPITAL_COMMUNITY): Payer: Self-pay | Admitting: Pulmonary Disease

## 2014-12-13 DIAGNOSIS — Z79899 Other long term (current) drug therapy: Secondary | ICD-10-CM

## 2014-12-13 DIAGNOSIS — D72829 Elevated white blood cell count, unspecified: Secondary | ICD-10-CM

## 2014-12-13 DIAGNOSIS — J810 Acute pulmonary edema: Secondary | ICD-10-CM

## 2014-12-13 DIAGNOSIS — Z978 Presence of other specified devices: Secondary | ICD-10-CM

## 2014-12-13 DIAGNOSIS — R0902 Hypoxemia: Secondary | ICD-10-CM

## 2014-12-13 DIAGNOSIS — Z7901 Long term (current) use of anticoagulants: Secondary | ICD-10-CM

## 2014-12-13 LAB — BASIC METABOLIC PANEL
Anion gap: 12 (ref 5–15)
BUN: 49 mg/dL — ABNORMAL HIGH (ref 6–20)
CO2: 25 mmol/L (ref 22–32)
Calcium: 9.3 mg/dL (ref 8.9–10.3)
Chloride: 101 mmol/L (ref 101–111)
Creatinine, Ser: 2.71 mg/dL — ABNORMAL HIGH (ref 0.61–1.24)
GFR calc Af Amer: 26 mL/min — ABNORMAL LOW (ref >60)
GFR calc non Af Amer: 23 mL/min — ABNORMAL LOW (ref >60)
Glucose, Bld: 73 mg/dL (ref 65–99)
Potassium: 4.7 mmol/L (ref 3.5–5.1)
Sodium: 138 mmol/L (ref 135–145)

## 2014-12-13 LAB — PROTEIN, BODY FLUID: Total protein, fluid: 3.7 g/dL

## 2014-12-13 LAB — ALBUMIN: Albumin: 3.4 g/dL — ABNORMAL LOW (ref 3.5–5.0)

## 2014-12-13 LAB — PROTIME-INR
INR: 2.97 — ABNORMAL HIGH (ref 0.00–1.49)
Prothrombin Time: 30.4 seconds — ABNORMAL HIGH (ref 11.6–15.2)

## 2014-12-13 LAB — BODY FLUID CELL COUNT WITH DIFFERENTIAL
Eos, Fluid: 0 %
Lymphs, Fluid: 85 %
Monocyte-Macrophage-Serous Fluid: 8 % — ABNORMAL LOW (ref 50–90)
Neutrophil Count, Fluid: 7 % (ref 0–25)
Total Nucleated Cell Count, Fluid: 1132 cu mm — ABNORMAL HIGH (ref 0–1000)

## 2014-12-13 LAB — PROTEIN, TOTAL
Total Protein: 7.6 g/dL (ref 6.5–8.1)
Total Protein: 6.8 g/dL (ref 6.5–8.1)

## 2014-12-13 LAB — LACTATE DEHYDROGENASE, PLEURAL OR PERITONEAL FLUID: LD, Fluid: 166 U/L — ABNORMAL HIGH (ref 3–23)

## 2014-12-13 LAB — LACTATE DEHYDROGENASE
LDH: 139 U/L (ref 98–192)
LDH: 146 U/L (ref 98–192)

## 2014-12-13 LAB — CHOLESTEROL, TOTAL: Cholesterol: 143 mg/dL (ref 0–200)

## 2014-12-13 MED ORDER — FUROSEMIDE 10 MG/ML IJ SOLN: 40.0000 mg | Freq: Two times a day (BID) | INTRAMUSCULAR | Status: DC

## 2014-12-13 MED ORDER — DIPHENHYDRAMINE HCL 25 MG PO CAPS
25.0000 mg | ORAL_CAPSULE | Freq: Once | ORAL | Status: AC
  Administered 2014-12-13: 25 mg via ORAL
  Filled 2014-12-13: qty 1

## 2014-12-13 MED ORDER — BISOPROLOL FUMARATE 5 MG PO TABS
5.0000 mg | ORAL_TABLET | Freq: Every day | ORAL | Status: DC
  Administered 2014-12-14 – 2014-12-20 (×4): 5 mg via ORAL
  Filled 2014-12-13 (×7): qty 1

## 2014-12-13 MED ORDER — FUROSEMIDE 10 MG/ML IJ SOLN
40.0000 mg | Freq: Every day | INTRAMUSCULAR | Status: DC
  Administered 2014-12-13: 40 mg via INTRAVENOUS
  Filled 2014-12-13 (×2): qty 4

## 2014-12-13 MED ORDER — SODIUM CHLORIDE 0.9 % IV SOLN: Freq: Once | INTRAVENOUS | Status: AC

## 2014-12-13 NOTE — Procedures (Signed)
Thoracentesis Procedure Note  Pre-operative Diagnosis: Pleural effusion   Post-operative Diagnosis: same  Indications: Pleural effusion  Procedure Details  Consent: Informed consent was obtained. Risks of the procedure were discussed including: infection, bleeding, pain, pneumothorax.  Under sterile conditions the patient was positioned. Betadine solution and sterile drapes were utilized.  1% plain lidocaine was used to anesthetize the 8 rib space. Fluid was obtained without any difficulties and minimal blood loss.  A dressing was applied to the wound and wound care instructions were provided.   Findings 1500 ml of bloody pleural fluid was obtained. A sample was sent to Pathology for cytogenetics, flow, and cell counts, as well as for infection analysis.  Complications:  None; patient tolerated the procedure well.          Condition: stable  Plan A follow up chest x-ray was ordered. Bed Rest for 0 hours. Tylenol 650 mg. for pain.  Attending Attestation: I was present and scrubbed for the entire procedure.  U/S used in placement.  Rush Farmer, M.D. [employer] [facility] Pulmonary/Critical Care Medicine. Pager: [phone]. After hours pager: [phone].

## 2014-12-13 NOTE — Progress Notes (Signed)
Utilization review completed. Nour Rodrigues, RN, BSN. 

## 2014-12-13 NOTE — Consult Note (Signed)
Name: Barry Lawrence MRN: [id number] DOB: 11-Feb-1946    ADMISSION DATE:  12/12/2014 CONSULTATION DATE:  7/21  REFERRING MD :  Teaching service  CHIEF COMPLAINT:  SOB  BRIEF PATIENT DESCRIPTION: Frail wm  SIGNIFICANT EVENTS    STUDIES:     HISTORY OF PRESENT ILLNESS:   69 yo life long smoker, known gold stage COPD 2(FEV1 1.51 51% predicted) , 6 mm left lung apex nodule followed with yearly CT scans, discharged from [facility] 7/12 with right sided pna(Klebsellia ) and returns with increased SOB, without fevers, chills , sweats or purulent sputum. He continues to smoke despite COPD and pulmonary nodule and has a plethora of health issues that include, CAD, CAF(on coumadin) CKD base creatine 2.35, PVD posy AAA repair and ABF. Due to his increased SOB, orthopnea,large rt pleural infusion PCCM asked top consult and performed thoracentesis. Note Dr. Melvyn Novas is his pulmonary MD and most of his care is at [facility].  PAST MEDICAL HISTORY :   has a past medical history of CAD (coronary artery disease); S/P CABG (coronary artery bypass graft) (1996); Hyperlipemia; Polycythemia; GERD (gastroesophageal reflux disease); Tobacco abuse; CKD (chronic kidney disease), stage III; Myocardial infarction (1996); OSA on CPAP; Pneumonia; Diabetes; Headache; Arthritis; Hypertension; Carotid stenosis (01/15/08); PAD (peripheral artery disease) (06/12/09); and Abdominal aortic aneurysm (01/28/10; 02/05/14).  has past surgical history that includes Carotid endarterectomy (Right, 01/15/08); Lumbar disc surgery (1998); pv angio (11/22/2007); Colonoscopy (N/A, 07/09/2013); PV angio (04/01/14); abdominal aortagram (N/A, 04/01/2014); Back surgery (1988); Coronary artery bypass graft (1996); Aorta - bilateral femoral artery bypass graft (N/A, 05/30/2014); and Aortic/renal bypass (Left, 05/30/2014). Prior to Admission medications   Medication Sig Start Date End Date Taking? Authorizing Provider  albuterol (PROVENTIL HFA;VENTOLIN HFA) 108 (90 BASE)  MCG/ACT inhaler Inhale 2 puffs into the lungs every 6 (six) hours as needed for wheezing or shortness of breath. 12/03/14  Yes Maryellen Pile, MD  amLODipine (NORVASC) 10 MG tablet Take 10 mg by mouth daily before breakfast.    Yes Historical Provider, MD  Aspirin-Acetaminophen-Caffeine (GOODY HEADACHE PO) as needed.   Yes Historical Provider, MD  atorvastatin (LIPITOR) 80 MG tablet Take 80 mg by mouth at bedtime.    Yes Historical Provider, MD  bisoprolol (ZEBETA) 5 MG tablet Take 5 mg by mouth daily. 11/11/14  Yes Historical Provider, MD  carvedilol (COREG) 12.5 MG tablet Take 12.5 mg by mouth daily. 11/30/14  Yes Historical Provider, MD  furosemide (LASIX) 40 MG tablet Take by mouth 2 (two) times daily. 60 mg q am and 40 mg q pm   Yes Historical Provider, MD  omeprazole (PRILOSEC) 20 MG capsule Take 20 mg by mouth at bedtime.    Yes Historical Provider, MD  potassium chloride SA (K-DUR,KLOR-CON) 20 MEQ tablet Take 20 mEq by mouth daily.   Yes Historical Provider, MD  warfarin (COUMADIN) 5 MG tablet Take 1 tablet (5 mg total) by mouth one time only at 6 PM. 12/09/14  Yes [name], MD  Tiotropium Bromide Monohydrate (SPIRIVA RESPIMAT) 2.5 MCG/ACT AERS Inhale 2 puffs into the lungs daily. Patient not taking: Reported on 12/12/2014 11/11/14   Tanda Rockers, MD   Allergies  Allergen Reactions  . Niaspan [Niacin Er] Hives    FAMILY HISTORY:  family history includes Heart failure in his father. SOCIAL HISTORY:  reports that he has been smoking Cigarettes.  He has a 56 pack-year smoking history. He has never used smokeless tobacco. He reports that he does not drink alcohol or  use illicit drugs.  REVIEW OF SYSTEMS:   10 point review of system taken, please see HPI for positives and negatives.  SUBJECTIVE:   VITAL SIGNS: Temp:  [97.6 F (36.4 C)-98.5 F (36.9 C)] 98.5 F (36.9 C) (07/22 0500) Pulse Rate:  [52-63] 57 (07/22 0657) Resp:  [16-26] 18 (07/22 0500) BP: (109-184)/(45-81)  132/60 mmHg (07/22 0657) SpO2:  [90 %-98 %] 94 % (07/22 0500) FiO2 (%):  [0 %] 0 % (07/21 1935) Weight:  [182 lb 6.4 oz (82.736 kg)-183 lb (83.008 kg)] 182 lb 6.4 oz (82.736 kg) (07/21 2100)  PHYSICAL EXAMINATION: General:  Older looking  than stated age, disheveled apperance.  Neuro: Intact, irascible  HEENT: No JVD, Poor dentition, no jvd/lan, PERL Cardiovascular:  HSIR Afib CVR 79 Lungs: Decreased bs rt base , dull to percussion 1/2 up, multiple areas of ecchymosis bilateral axillary areas Abdomen:  Old mid line scar, +bs Musculoskeletal: Intact Skin:  Lower ext, with healed ulcers, poor flow   Recent Labs Lab 12/12/14 1710 12/13/14 0521  NA 134* 138  K 4.9 4.7  CL 98* 101  CO2 23 25  BUN 48* 49*  CREATININE 2.78* 2.71*  GLUCOSE 93 73    Recent Labs Lab 12/12/14 1710  HGB 15.0  HCT 45.3  WBC 10.9*  PLT 329   Ct Chest Wo Contrast  12/12/2014   CLINICAL DATA:  Shortness of breath. Large right pleural effusion on chest radiographs earlier today. Recently treated with antibiotics for pneumonia with no symptomatic improvement.  EXAM: CT CHEST WITHOUT CONTRAST  TECHNIQUE: Multidetector CT imaging of the chest was performed following the standard protocol without IV contrast.  COMPARISON:  Chest radiographs obtained earlier today and chest CT dated 09/04/2014.  FINDINGS: Large right pleural effusion. Consolidation and volume loss involving the right lower lobe and right middle lobe and, to a lesser degree, the right upper lobe. Clear left lung. Air bronchograms with no visible mass and no enlarged lymph nodes.  Atheromatous arterial calcifications, including the coronary arteries. Post CABG changes. The ascending thoracic aorta is currently just below the upper limit of normal with a transverse diameter of 3.9 cm on image number 22 at the level of the main pulmonary artery, previously 4.2 cm.  Gallstones in the gallbladder measuring up to 2.0 cm in maximum diameter each. No  gallbladder wall thickening or pericholecystic fluid seen. No significant change in previously demonstrated bilateral fat density containing adrenal masses. The mass on the left currently measures 3.1 x 2.7 cm in the mass on the right currently measures 1.7 x 1.6 cm.  IMPRESSION: 1. Large right pleural effusion. 2. Compressive atelectasis and possible pneumonia involving the right lower lobe, right middle lobe and a portion of the right upper lobe. 3. No visible mass or adenopathy. 4. Cholelithiasis. 5. Stable bilateral adrenal myelolipomas.   Electronically Signed   By: Claudie Revering M.D.   On: 12/12/2014 18:06      ASSESSMENT / PLAN:  Principal Problem:   Parapneumonic effusion Active Problems:   CAD (coronary artery disease)   Carotid artery disease   Hypertensive heart disease   GERD (gastroesophageal reflux disease)   Abdominal aortic aneurysm without rupture   COPD GOLD II still smoking    Cigarette smoker   CKD (chronic kidney disease), stage IV   PAD (peripheral artery disease)   Sleep apnea   Atrial fibrillation   Chronic diastolic CHF (congestive heart failure)  Discussion: 69 yo life long smoker, known gold stage COPD 2(FEV1  1.51 51% predicted) , 6 mm left lung apex nodule followed with yearly CT scans, discharged [facility] 7/12 with right sided pna(Klebsellia ) and returns with increased SOB, without fevers, chills , sweats or purulent sputum. He continues to smoke despite COPD and pulmonary nodule and has a plethora of health issues that include, CAD, CAF(on coumadin) CKD base creatine 2.35, PVD posy AAA repair and ABF. Due to his increased SOB, orthopnea,large rt pleural infusion PCCM asked top consult and performed thoracentesis. Note Dr. Melvyn Novas is his pulmonary MD and most of his care is at [facility].  Richardson Landry Minor ACNP Maryanna Shape PCCM Pager [phone] till 3 pm If no answer page [phone]  Attending Note:  69 year old male smoker on coumadin for a-fib who presents to the hospital  with SOB.  CXR showed pleural effusion.  PCCM consulted for pleural effusion and concern for an empyema.  Decrease BS on the right and dullness to percussion.  Discussed with resident and PCCM-NP.  I reviewed chest CT myself and noted large partially loculated pleural effusion on the right.  Pleural effusion: likely heart failure vs post cardiotomy syndrome.  - Thora today.  - Fluid analysis.  - Treat according to fluid analysis.  Pulmonary edema:  - Diureses as renal function allows.  OSA:  - CPAP  - Will need sleep study as outpatient.  Hypoxemia:  - Supplemental O2.  - Titrate O2 for sat of 88-92%.  - Will need an ambulatory desaturation study prior to discharge to qualify for home O2.  Patient seen and examined, agree with above note.  I dictated the care and orders written for this patient under my direction.  Rush Farmer, MD [phone]  12/13/2014, 10:04 AM

## 2014-12-13 NOTE — Progress Notes (Addendum)
Physician notified: Richardson Landry, NP At: [id number]  Regarding: Pt receiving 2nd unit, two small hives noted and itching. Benadryl? Awaiting return response.   Returned Response at: 1155  Order(s): 25mg  benadryl PO once.

## 2014-12-13 NOTE — Progress Notes (Signed)
Subjective: Patient complaining of dyspnea which has been ongoing since last admission here. He reports that his leg edema is stable but his abdominal swelling has been increasing over the past several weeks.   Objective: Vital signs in last 24 hours: Filed Vitals:   12/13/14 1115 12/13/14 1125 12/13/14 1153 12/13/14 1314  BP: 134/65 134/64 141/63 134/70  Pulse: 51 48 54 47  Temp: 97.5 F (36.4 C) 97.6 F (36.4 C) 97.3 F (36.3 C) 97.7 F (36.5 C)  TempSrc: Oral Oral Oral Oral  Resp: 18 17 17 17   Height:      Weight:      SpO2: 98% 98% 98% 96%   Weight change:   Intake/Output Summary (Last 24 hours) at 12/13/14 1538 Last data filed at 12/13/14 1314  Gross per 24 hour  Intake    540 ml  Output    225 ml  Net    315 ml   Physical Exam GENERAL- alert, co-operative, appears as stated age, not in any distress. CARDIAC- Irregularly irregular rhythm, no murmurs, rubs or gallops. RESP- Decreased breath sounds over Right middle and lower lung fields, clear to auscultation on the left, no wheezes or crackles. ABDOMEN- Soft, nontender, no guarding or rebound, no palpable masses or organomegaly, bowel sounds present. EXTREMITIES- pulse 2+, symmetric, bilateral 2+ pitting edema, to mid thigh on Right and to knee on Left. SKIN- Warm, dry, No rash or lesion. PSYCH- Normal mood and affect, appropriate thought content and speech.  Lab Results: Basic Metabolic Panel:  Recent Labs Lab 12/12/14 1710 12/13/14 0521  NA 134* 138  K 4.9 4.7  CL 98* 101  CO2 23 25  GLUCOSE 93 73  BUN 48* 49*  CREATININE 2.78* 2.71*  CALCIUM 9.6 9.3   Liver Function Tests:  Recent Labs Lab 12/12/14 1710 12/13/14 1329  AST 18  --   ALT 16*  --   ALKPHOS 101  --   BILITOT 0.6  --   PROT 7.7 7.6  ALBUMIN 3.4* 3.4*   CBC:  Recent Labs Lab 12/12/14 1710  WBC 10.9*  NEUTROABS 8.5*  HGB 15.0  HCT 45.3  MCV 88.5  PLT 329   Coagulation:  Recent Labs Lab 12/09/14 0936 12/12/14 1710  12/13/14 0521  LABPROT  --  27.9* 30.4*  INR 2.7 2.65* 2.97*   Micro Results: No results found for this or any previous visit (from the past 240 hour(s)). Studies/Results: Ct Chest Wo Contrast  12/12/2014   CLINICAL DATA:  Shortness of breath. Large right pleural effusion on chest radiographs earlier today. Recently treated with antibiotics for pneumonia with no symptomatic improvement.  EXAM: CT CHEST WITHOUT CONTRAST  TECHNIQUE: Multidetector CT imaging of the chest was performed following the standard protocol without IV contrast.  COMPARISON:  Chest radiographs obtained earlier today and chest CT dated 09/04/2014.  FINDINGS: Large right pleural effusion. Consolidation and volume loss involving the right lower lobe and right middle lobe and, to a lesser degree, the right upper lobe. Clear left lung. Air bronchograms with no visible mass and no enlarged lymph nodes.  Atheromatous arterial calcifications, including the coronary arteries. Post CABG changes. The ascending thoracic aorta is currently just below the upper limit of normal with a transverse diameter of 3.9 cm on image number 22 at the level of the main pulmonary artery, previously 4.2 cm.  Gallstones in the gallbladder measuring up to 2.0 cm in maximum diameter each. No gallbladder wall thickening or pericholecystic fluid seen. No significant  change in previously demonstrated bilateral fat density containing adrenal masses. The mass on the left currently measures 3.1 x 2.7 cm in the mass on the right currently measures 1.7 x 1.6 cm.  IMPRESSION: 1. Large right pleural effusion. 2. Compressive atelectasis and possible pneumonia involving the right lower lobe, right middle lobe and a portion of the right upper lobe. 3. No visible mass or adenopathy. 4. Cholelithiasis. 5. Stable bilateral adrenal myelolipomas.   Electronically Signed   By: Claudie Revering M.D.   On: 12/12/2014 18:06   Medications: I have reviewed the patient's current  medications. Scheduled Meds: . amLODipine  10 mg Oral QAC breakfast  . atorvastatin  80 mg Oral QHS  . carvedilol  12.5 mg Oral Daily  . furosemide  40 mg Intravenous Daily  . pantoprazole  40 mg Oral Daily  . potassium chloride SA  20 mEq Oral Daily  . sodium chloride  3 mL Intravenous Q12H   Continuous Infusions:  PRN Meds:.acetaminophen **OR** acetaminophen, albuterol Assessment/Plan: Principal Problem:   Parapneumonic effusion Active Problems:   CAD (coronary artery disease)   Carotid artery disease   Hypertensive heart disease   GERD (gastroesophageal reflux disease)   Abdominal aortic aneurysm without rupture   COPD GOLD II still smoking    Cigarette smoker   CKD (chronic kidney disease), stage IV   PAD (peripheral artery disease)   Sleep apnea   Atrial fibrillation   Chronic diastolic CHF (congestive heart failure)  Pleural effusion: Large right sided pleural effusion on Chest CT, no evidence of a complicated effusion or empyema. Pulmonary consulted for thoracentesis, with get effusion studies to determine transudate vs exudate, most likely parapneumonic. No ABX, pt reported completing course of Keflex (to which previous PNA was sensitive) and has been afebrile, has no symptoms of a bacterial infection and his WBC is improved from previous admission. Patient is on coumadin for A Fib, INR 2.65. Has received two units of FFP.  - Pulom consulted for thoracentesis - Effusion studies: pH, LDH, protein, glucose, culture, cell count, gram stain, fungal stain, AFB - s/p 2 units of FFP - Will start IV lasix 40 mg qdaily  Chronic diastolic CHF (congestive heart failure): No crackles appreciated on exam, though legs are edematous and pt reports increased abdominal girth.Got FFP today, will change to IV.  - Stop PO lasix, start Lasix IV 40 mg qdaily - Strict I&O - Daily weights - Continue Amlodipine 10mg    AKI on CKD (chronic kidney disease), stage IV: SCr at 2.71 from 2.35 on  7/11. Current cause unclear. Could either be pre-renal 2/2 poor fluid intake while ill, or 2/2 CHF. Will switch to IV Lasix today and monitor Cr. - Will monitor daily  Carotid artery disease- s/p CABG - No active chest pain, initial troponin negative, EKG without any signs of new ischemia. - Continue home medications, do not suspect ACS. - Atorva 80mg  - Amlodipine 10mg   Atrial fibrillation: INR 2.65. Rate controlled on Bisoprolol at home, though bradycardic  - CHADSVasc at least 4 - Stopped coumadin today for thoracentesis, will restart tomorrow - Hold bisoprolol, will restart tomorrow  COPD GOLD II still smoking. PT eval as pt will require home O2 with activities.  - Will need home O2 on discharge - Continue Spirvia - Goal O2 sat 89-94% - Albuterol PRN  Cigarette smoker - Needs to stop smoking. - RN to provide smoking cessation education  Sleep apnea - Patient says he does not want CPAP while in the  hospital  FEN/GI: - HH - Protonix  DVTPPx: Coumadin - Will restart tomorrow Per pharmacy.  Dispo: Disposition is deferred at this time, awaiting improvement of current medical problems.  Anticipated discharge in approximately 1-3 day(s).   The patient does have a current PCP Christain Sacramento, MD) and does need an [facility] hospital follow-up appointment after discharge.  The patient does not have transportation limitations that hinder transportation to clinic appointments.   LOS: 1 day   Maryellen Pile, MD 12/13/2014, 3:38 PM

## 2014-12-14 ENCOUNTER — Inpatient Hospital Stay (HOSPITAL_COMMUNITY): Payer: Medicare Other

## 2014-12-14 DIAGNOSIS — J90 Pleural effusion, not elsewhere classified: Secondary | ICD-10-CM | POA: Insufficient documentation

## 2014-12-14 DIAGNOSIS — I2510 Atherosclerotic heart disease of native coronary artery without angina pectoris: Secondary | ICD-10-CM

## 2014-12-14 DIAGNOSIS — N184 Chronic kidney disease, stage 4 (severe): Secondary | ICD-10-CM

## 2014-12-14 DIAGNOSIS — F17210 Nicotine dependence, cigarettes, uncomplicated: Secondary | ICD-10-CM

## 2014-12-14 DIAGNOSIS — N179 Acute kidney failure, unspecified: Secondary | ICD-10-CM

## 2014-12-14 DIAGNOSIS — J449 Chronic obstructive pulmonary disease, unspecified: Secondary | ICD-10-CM

## 2014-12-14 DIAGNOSIS — R0600 Dyspnea, unspecified: Secondary | ICD-10-CM

## 2014-12-14 DIAGNOSIS — J948 Other specified pleural conditions: Secondary | ICD-10-CM

## 2014-12-14 DIAGNOSIS — Z7951 Long term (current) use of inhaled steroids: Secondary | ICD-10-CM

## 2014-12-14 DIAGNOSIS — I5032 Chronic diastolic (congestive) heart failure: Secondary | ICD-10-CM

## 2014-12-14 DIAGNOSIS — Z951 Presence of aortocoronary bypass graft: Secondary | ICD-10-CM

## 2014-12-14 DIAGNOSIS — I4891 Unspecified atrial fibrillation: Secondary | ICD-10-CM

## 2014-12-14 DIAGNOSIS — G4733 Obstructive sleep apnea (adult) (pediatric): Secondary | ICD-10-CM

## 2014-12-14 LAB — PREPARE FRESH FROZEN PLASMA
Unit division: 0
Unit division: 0
Unit division: 0

## 2014-12-14 LAB — CBC
HCT: 39.9 % (ref 39.0–52.0)
Hemoglobin: 12.9 g/dL — ABNORMAL LOW (ref 13.0–17.0)
MCH: 28.7 pg (ref 26.0–34.0)
MCHC: 32.3 g/dL (ref 30.0–36.0)
MCV: 88.9 fL (ref 78.0–100.0)
Platelets: 300 10*3/uL (ref 150–400)
RBC: 4.49 MIL/uL (ref 4.22–5.81)
RDW: 15.1 % (ref 11.5–15.5)
WBC: 11.8 10*3/uL — ABNORMAL HIGH (ref 4.0–10.5)

## 2014-12-14 LAB — PH, BODY FLUID: pH, Fluid: 8

## 2014-12-14 LAB — PROTIME-INR
INR: 2.27 — ABNORMAL HIGH (ref 0.00–1.49)
Prothrombin Time: 24.8 seconds — ABNORMAL HIGH (ref 11.6–15.2)

## 2014-12-14 LAB — GLUCOSE, SEROUS FLUID: Glucose, Fluid: 66 mg/dL

## 2014-12-14 MED ORDER — CEFTRIAXONE SODIUM IN DEXTROSE 40 MG/ML IV SOLN
2.0000 g | INTRAVENOUS | Status: DC
  Administered 2014-12-14 – 2014-12-20 (×7): 2 g via INTRAVENOUS
  Filled 2014-12-14 (×8): qty 50

## 2014-12-14 MED ORDER — WARFARIN - PHARMACIST DOSING INPATIENT: Freq: Every day | Status: DC

## 2014-12-14 MED ORDER — FUROSEMIDE 40 MG PO TABS: 40.0000 mg | ORAL_TABLET | Freq: Two times a day (BID) | ORAL | Status: DC

## 2014-12-14 MED ORDER — IPRATROPIUM-ALBUTEROL 0.5-2.5 (3) MG/3ML IN SOLN: 3.0000 mL | Freq: Four times a day (QID) | RESPIRATORY_TRACT | Status: DC | PRN

## 2014-12-14 MED ORDER — FUROSEMIDE 40 MG PO TABS
40.0000 mg | ORAL_TABLET | Freq: Every day | ORAL | Status: DC
  Administered 2014-12-14: 40 mg via ORAL
  Filled 2014-12-14 (×3): qty 1

## 2014-12-14 MED ORDER — CEFTRIAXONE SODIUM IN DEXTROSE 20 MG/ML IV SOLN: 1.0000 g | INTRAVENOUS | Status: DC

## 2014-12-14 MED ORDER — FUROSEMIDE 40 MG PO TABS
60.0000 mg | ORAL_TABLET | Freq: Every day | ORAL | Status: DC
  Administered 2014-12-14: 60 mg via ORAL
  Filled 2014-12-14: qty 1

## 2014-12-14 MED ORDER — OXYCODONE HCL 5 MG PO TABS
5.0000 mg | ORAL_TABLET | Freq: Once | ORAL | Status: AC
  Administered 2014-12-14: 5 mg via ORAL
  Filled 2014-12-14: qty 1

## 2014-12-14 MED ORDER — WARFARIN SODIUM 7.5 MG PO TABS: 7.5000 mg | ORAL_TABLET | Freq: Once | ORAL | Status: DC

## 2014-12-14 NOTE — Progress Notes (Signed)
Patient ID: BARETT SOBRINO, male   DOB: 09-08-1945, 69 y.o.   MRN: [place]:[id number] Medicine attending: I personally examined this patient this morning together with resident physician Dr. Denton Brick and I discussed management with her. 69 year old man recently treated for Klebsiella pneumonia. He presented with increasing dyspnea and was found to have a large left pleural effusion. He underwent thoracentesis yesterday. Fluid characteristics suggest an exudate with 3.7 g percent protein. Cell count shows 1132 white cells which are predominantly lymphocytes (85% with only 7% neutrophils). He had a shaking chill last evening. Currently afebrile. We are going to put him on some prophylactic antibiotics pending culture results. We appreciate pulmonary medicine follow-up. We will monitor the patient's clinical and radiographic status. On exam this morning, dullness to percussion one fourth the way up over the left hemithorax but no egophony.

## 2014-12-14 NOTE — Progress Notes (Signed)
ANTICOAGULATION CONSULT NOTE - Initial Consult  Pharmacy Consult for Coumadin Indication: atrial fibrillation  Allergies  Allergen Reactions  . Niaspan [Niacin Er] Hives    Patient Measurements: Height: 5\' 7"  (170.2 cm) Weight: 181 lb 8 oz (82.328 kg) IBW/kg (Calculated) : 66.1 Heparin Dosing Weight:   Vital Signs: Temp: 97.9 F (36.6 C) (07/23 0759) Temp Source: Oral (07/23 0759) BP: 146/69 mmHg (07/23 0759) Pulse Rate: 60 (07/23 0759)  Labs:  Recent Labs  12/12/14 1710 12/13/14 0521 12/14/14 0331 12/14/14 0956  HGB 15.0  --   --  12.9*  HCT 45.3  --   --  39.9  PLT 329  --   --  300  LABPROT 27.9* 30.4* 24.8*  --   INR 2.65* 2.97* 2.27*  --   CREATININE 2.78* 2.71*  --   --     Estimated Creatinine Clearance: 26.8 mL/min (by C-G formula based on Cr of 2.71).   Medical History: Past Medical History  Diagnosis Date  . CAD (coronary artery disease)     myoview 04/21/11-normal, EF49%  . S/P CABG (coronary artery bypass graft) 1996  . Hyperlipemia   . Polycythemia     H/O  . GERD (gastroesophageal reflux disease)   . Tobacco abuse   . CKD (chronic kidney disease), stage III   . Myocardial infarction 1996  . OSA on CPAP     CPAP q night , CPAP is through [place], states doesn't remember when he had the last study  . Pneumonia     hosp. as a child with pneumonia, not since   . Diabetes     BORDERLINE  . Headache     uses [employer], states he is lessening what he is taking for prep. for surgery   . Arthritis     in his back  . Hypertension     stress test- done 04/2014, followed by Dr. Gwenlyn Found  . Carotid stenosis 01/15/08    right endarterectomy-Dr. Amedeo Plenty  . PAD (peripheral artery disease) 06/12/09    a. 11/22/07 PTA & stenting right external iliac artery;bilateral iliac & PTI and stenting 1997;right ICA =>50% reduction,right SFA >50%,left ICA at stent => 50% reduction,left EIA => 50% reduction,left ATA occluded, left SFA mid > 49% reduction;  03/2014 Angio:  LRA 90, patent L Iliac stent and distal RCIA stent, large saccular AAA.  [name] Abdominal aortic aneurysm 01/28/10; 02/05/14    4.3x4.6cm;  now measuring 5.5 cm, status post aortobifemoral bypass grafting in January 2016 by Dr. problem along with left renal artery bypass    Medications:  Scheduled:  . amLODipine  10 mg Oral QAC breakfast  . atorvastatin  80 mg Oral QHS  . bisoprolol  5 mg Oral Daily  . cefTRIAXone (ROCEPHIN)  IV  2 g Intravenous Q24H  . furosemide  40 mg Oral q1800  . furosemide  60 mg Oral Daily  . pantoprazole  40 mg Oral Daily  . potassium chloride SA  20 mEq Oral Daily  . sodium chloride  3 mL Intravenous Q12H    Assessment: 69yo s/p thoracentesis for large pleural effusion, to resume Coumadin for AFib.  INR was therapeutic on home dose of 5mg  daily, and is 2.27 this AM; last dose was on 7/20.  He also received FFP x 2 on 7/22 for INR 2.97.  Hg 12.9, pltc wnl.  Will give a larger dose today due to missed doses.  Pt is on Ceftriaxone for possible empyema.  Goal of Therapy:  INR 2-3 Monitor platelets by anticoagulation protocol: Yes   Plan:  Coumadin 7.5mg  today Daily INR  Gracy Bruins, PharmD [facility]

## 2014-12-14 NOTE — Progress Notes (Signed)
Subjective: Dyspnea has significantly improved. Complainst of chills overnight but temperature WNL.   Objective: Vital signs in last 24 hours: Filed Vitals:   12/13/14 1615 12/13/14 1945 12/14/14 0502 12/14/14 0759  BP: 138/70 132/54 133/53 146/69  Pulse: 68 57 66 60  Temp:  97.9 F (36.6 C) 98 F (36.7 C) 97.9 F (36.6 C)  TempSrc:  Oral Oral Oral  Resp:  18 18 17   Height:      Weight:  181 lb 8 oz (82.328 kg)    SpO2: 96% 92% 95% 94%    Intake/Output Summary (Last 24 hours) at 12/14/14 1135 Last data filed at 12/14/14 1048  Gross per 24 hour  Intake   1073 ml  Output    925 ml  Net    148 ml   Physical Exam GENERAL- alert, co-operative, appears as stated age, not in any distress. CARDIAC- Irregularly irregular rhythm, no murmurs, rubs or gallops. RESP- Breath sounds improved- on the right, minimal crackles heard. ABDOMEN- Soft, nontender, no guarding or rebound, no palpable masses or organomegaly, bowel sounds present. EXTREMITIES- Pitting edema bilat lower extremity.  SKIN- Warm, dry, No rash or lesion. PSYCH- Normal mood and affect, appropriate thought content and speech.  Lab Results: Basic Metabolic Panel:  Recent Labs Lab 12/12/14 1710 12/13/14 0521  NA 134* 138  K 4.9 4.7  CL 98* 101  CO2 23 25  GLUCOSE 93 73  BUN 48* 49*  CREATININE 2.78* 2.71*  CALCIUM 9.6 9.3   Liver Function Tests:  Recent Labs Lab 12/12/14 1710 12/13/14 1329 12/13/14 1650  AST 18  --   --   ALT 16*  --   --   ALKPHOS 101  --   --   BILITOT 0.6  --   --   PROT 7.7 7.6 6.8  ALBUMIN 3.4* 3.4*  --    CBC:  Recent Labs Lab 12/12/14 1710 12/14/14 0956  WBC 10.9* 11.8*  NEUTROABS 8.5*  --   HGB 15.0 12.9*  HCT 45.3 39.9  MCV 88.5 88.9  PLT 329 300   Coagulation:  Recent Labs Lab 12/09/14 0936 12/12/14 1710 12/13/14 0521 12/14/14 0331  LABPROT  --  27.9* 30.4* 24.8*  INR 2.7 2.65* 2.97* 2.27*   Studies/Results: Dg Chest 2 View  12/14/2014   CLINICAL  DATA:  Shortness of breath for 2 days. Status post thoracentesis 12/13/2014.  EXAM: CHEST  2 VIEW  COMPARISON:  Single view of the chest 12/13/2014. CT chest 12/12/2014.  FINDINGS: Moderate right pleural effusion and basilar airspace disease appear unchanged compared to yesterday's examination. The left lung is clear. There is cardiomegaly. The patient is status post CABG.  IMPRESSION: No change in a moderate right pleural effusion and basilar airspace disease.   Electronically Signed   By: Inge Rise M.D.   On: 12/14/2014 10:17   Ct Chest Wo Contrast  12/12/2014   CLINICAL DATA:  Shortness of breath. Large right pleural effusion on chest radiographs earlier today. Recently treated with antibiotics for pneumonia with no symptomatic improvement.  EXAM: CT CHEST WITHOUT CONTRAST  TECHNIQUE: Multidetector CT imaging of the chest was performed following the standard protocol without IV contrast.  COMPARISON:  Chest radiographs obtained earlier today and chest CT dated 09/04/2014.  FINDINGS: Large right pleural effusion. Consolidation and volume loss involving the right lower lobe and right middle lobe and, to a lesser degree, the right upper lobe. Clear left lung. Air bronchograms with no visible mass and no enlarged  lymph nodes.  Atheromatous arterial calcifications, including the coronary arteries. Post CABG changes. The ascending thoracic aorta is currently just below the upper limit of normal with a transverse diameter of 3.9 cm on image number 22 at the level of the main pulmonary artery, previously 4.2 cm.  Gallstones in the gallbladder measuring up to 2.0 cm in maximum diameter each. No gallbladder wall thickening or pericholecystic fluid seen. No significant change in previously demonstrated bilateral fat density containing adrenal masses. The mass on the left currently measures 3.1 x 2.7 cm in the mass on the right currently measures 1.7 x 1.6 cm.  IMPRESSION: 1. Large right pleural effusion. 2.  Compressive atelectasis and possible pneumonia involving the right lower lobe, right middle lobe and a portion of the right upper lobe. 3. No visible mass or adenopathy. 4. Cholelithiasis. 5. Stable bilateral adrenal myelolipomas.   Electronically Signed   By: Claudie Revering M.D.   On: 12/12/2014 18:06   Dg Chest Port 1 View  12/13/2014   CLINICAL DATA:  Cough, shortness of breath, post thoracentesis, history hypertension, CHF, CABG, MI, chronic kidney disease  EXAM: PORTABLE CHEST - 1 VIEW  COMPARISON:  Portable exam 1609 hours compared to 12/12/2014  FINDINGS: Enlargement of cardiac silhouette post CABG.  Atherosclerotic calcification and elongation of thoracic aorta.  Pulmonary vascularity normal.  Decrease in RIGHT pleural effusion and basilar atelectasis post thoracentesis.  No pneumothorax.  LEFT lung clear.  Bones unremarkable.  IMPRESSION: Decrease in RIGHT pleural effusion and basilar atelectasis post thoracentesis without evidence of pneumothorax.  Enlargement of cardiac silhouette post CABG.   Electronically Signed   By: Lavonia Dana M.D.   On: 12/13/2014 16:20   Medications: I have reviewed the patient's current medications. Scheduled Meds: . amLODipine  10 mg Oral QAC breakfast  . atorvastatin  80 mg Oral QHS  . bisoprolol  5 mg Oral Daily  . cefTRIAXone (ROCEPHIN)  IV  2 g Intravenous Q24H  . furosemide  40 mg Oral q1800  . furosemide  60 mg Oral Daily  . pantoprazole  40 mg Oral Daily  . potassium chloride SA  20 mEq Oral Daily  . sodium chloride  3 mL Intravenous Q12H   Assessment/Plan:  Pleural effusion: Had thoracocenthesis 12/14/2014. 1557ml of bloody pleural fluid. Post thoracocenthesis chest xray- No Pneumothorax, with decrease in right pleural effusion.  Fluid analysis- Pleural and Serum LDH- ratio >1, Pleural and serum Prot- 0.54, Pleural fluid glucose- added on pending, cell count- 1132, (>10000 seen in bacterial PNA). WBC- stable- 11.8 - Repeat chest xray today stabe right  pleural effusion, but appears improved compared to yesterday - Will start IV rocephin for now, considering prior blood cultures of klebsiella. - Follow up Pleural fluid cultures, and blood cultures. - D/c warfarin per pharmacy consult for now. - Consider thoracostomy tube.   Chronic diastolic CHF (congestive heart failure): Diastolic heart failure, Grade 2.  - Switch to home Po lasix - Bmet Check - Strict I&O - Daily weights - Continue Amlodipine 10mg   - Cont Bisoprolol 5mg  daily  AKI on CKD (chronic kidney disease), stage IV: SCr at 2.71 from 2.35 on 7/11.  - Cont home lasix dose - Bmet  Carotid artery disease- s/p CABG - No active chest pain, initial troponin negative, EKG without any signs of new ischemia. - Continue home medications, do not suspect ACS. - Atorva 80mg  - Bisoprolol 5mg  daily - Amlodipine 10mg   Atrial fibrillation: INR 2.65. Rate controlled on Bisoprolol at home,  -  CHADSVasc at least 4 - Stopped coumadin today for thoracentesis, will restart tomorrow - Hold bisoprolol, will restart tomorrow  COPD GOLD II still smoking. PT eval as pt will require home O2 with activities.  - Will need home O2 on discharge - Continue Spirvia - Goal O2 sat 89-94% - Albuterol PRN  Cigarette smoker - Needs to stop smoking. - RN to provide smoking cessation education  Sleep apnea - Patient says he does not want CPAP while in the hospital  FEN/GI: - HH - Protonix  DVTPPx: Coumadin - Will restart tomorrow Per pharmacy.  Addendum- 1.00pm Complaints of SOB later after ceftriazone given, has tolerated this well in the past. No oropharyngeal edema, no rash, mild crackles present on exam- left, reduced breath sounds right, pitting pedal edema improved. When i saw patient he appeared stable, breathing had improved, room was warm and he was complaining of chills, was wrapped in blankets, temp oral- 99.2. Vitals stable. Will give Duonebs, continue  antibiotics for now.   Dispo:  Disposition is deferred at this time, awaiting improvement of current medical problems.  Anticipated discharge in approximately 1-3 day(s).    LOS: 2 days   Bethena Roys, MD 12/14/2014, 11:34 AM

## 2014-12-14 NOTE — Progress Notes (Signed)
12/14/2014 11:51 AM   Patient complaining for chills and shortness of breath. IV ABT have just completed. Check eMAR. Informed MD. Nurse Tech obtaining vital signs. Will continue to assess and monitor the patient.   [employer], RN-BC, [facility] [place] [address] Phone [phone]

## 2014-12-14 NOTE — Progress Notes (Signed)
Name: Barry Lawrence MRN: [id number] DOB: 04/13/1946    ADMISSION DATE:  12/12/2014 CONSULTATION DATE:  7/21  REFERRING MD :  Teaching service  CHIEF COMPLAINT:  SOB  BRIEF PATIENT DESCRIPTION: Frail wm  SIGNIFICANT EVENTS    STUDIES: Thoracentesis 7/22    HISTORY OF PRESENT ILLNESS:   69 yo life long smoker, known gold stage COPD 2(FEV1 1.51 51% predicted) , 6 mm left lung apex nodule followed with yearly CT scans, discharged from [facility] 7/12 with right sided pna(Klebsellia ) and returns with increased SOB, without fevers, chills , sweats or purulent sputum. He continues to smoke despite COPD and pulmonary nodule and has a plethora of health issues that include, CAD, CAF(on coumadin) CKD base creatine 2.35, PVD posy AAA repair and ABF. Due to his increased SOB, orthopnea,large rt pleural infusion PCCM asked top consult and performed thoracentesis. Note Dr. Melvyn Novas is his pulmonary MD and most of his care is at [facility].   SUBJECTIVE: "Feel rough". Hard chill x 1 last night. Cough plain sputum. Breathing better since thoracentesis.  VITAL SIGNS: Temp:  [97.3 F (36.3 C)-98 F (36.7 C)] 98 F (36.7 C) (07/23 0502) Pulse Rate:  [47-117] 66 (07/23 0502) Resp:  [17-18] 18 (07/23 0502) BP: (132-149)/(53-73) 133/53 mmHg (07/23 0502) SpO2:  [92 %-98 %] 95 % (07/23 0502) Weight:  [82.328 kg (181 lb 8 oz)] 82.328 kg (181 lb 8 oz) (07/22 1945)  PHYSICAL EXAMINATION: General:  Older looking  than stated age, disheveled apperance. Up bedside eating breakfast Neuro: Intact, conversational, oriented HEENT: No JVD, Poor dentition, no jvd/lan, PERL Cardiovascular:  HSIR Afib CVR 79 Lungs: Decreased bs rt base , dull to percussion still 1/2 up, multiple areas of ecchymosis bilateral axillary areas, no rub Abdomen:  Old mid line scar, +bs Musculoskeletal: Intact Skin:  Lower ext, with healed ulcers, poor flow   Recent Labs Lab 12/12/14 1710 12/13/14 0521  NA 134* 138  K 4.9 4.7  CL 98* 101    CO2 23 25  BUN 48* 49*  CREATININE 2.78* 2.71*  GLUCOSE 93 73    Recent Labs Lab 12/12/14 1710  HGB 15.0  HCT 45.3  WBC 10.9*  PLT 329   Ct Chest Wo Contrast  12/12/2014   CLINICAL DATA:  Shortness of breath. Large right pleural effusion on chest radiographs earlier today. Recently treated with antibiotics for pneumonia with no symptomatic improvement.  EXAM: CT CHEST WITHOUT CONTRAST  TECHNIQUE: Multidetector CT imaging of the chest was performed following the standard protocol without IV contrast.  COMPARISON:  Chest radiographs obtained earlier today and chest CT dated 09/04/2014.  FINDINGS: Large right pleural effusion. Consolidation and volume loss involving the right lower lobe and right middle lobe and, to a lesser degree, the right upper lobe. Clear left lung. Air bronchograms with no visible mass and no enlarged lymph nodes.  Atheromatous arterial calcifications, including the coronary arteries. Post CABG changes. The ascending thoracic aorta is currently just below the upper limit of normal with a transverse diameter of 3.9 cm on image number 22 at the level of the main pulmonary artery, previously 4.2 cm.  Gallstones in the gallbladder measuring up to 2.0 cm in maximum diameter each. No gallbladder wall thickening or pericholecystic fluid seen. No significant change in previously demonstrated bilateral fat density containing adrenal masses. The mass on the left currently measures 3.1 x 2.7 cm in the mass on the right currently measures 1.7 x 1.6 cm.  IMPRESSION: 1. Large right pleural  effusion. 2. Compressive atelectasis and possible pneumonia involving the right lower lobe, right middle lobe and a portion of the right upper lobe. 3. No visible mass or adenopathy. 4. Cholelithiasis. 5. Stable bilateral adrenal myelolipomas.   Electronically Signed   By: Claudie Revering M.D.   On: 12/12/2014 18:06   Dg Chest Port 1 View  12/13/2014   CLINICAL DATA:  Cough, shortness of breath, post  thoracentesis, history hypertension, CHF, CABG, MI, chronic kidney disease  EXAM: PORTABLE CHEST - 1 VIEW  COMPARISON:  Portable exam 1609 hours compared to 12/12/2014  FINDINGS: Enlargement of cardiac silhouette post CABG.  Atherosclerotic calcification and elongation of thoracic aorta.  Pulmonary vascularity normal.  Decrease in RIGHT pleural effusion and basilar atelectasis post thoracentesis.  No pneumothorax.  LEFT lung clear.  Bones unremarkable.  IMPRESSION: Decrease in RIGHT pleural effusion and basilar atelectasis post thoracentesis without evidence of pneumothorax.  Enlargement of cardiac silhouette post CABG.   Electronically Signed   By: Lavonia Dana M.D.   On: 12/13/2014 16:20   I reviewed CXR post-tap 7/22. Still moderate R effusion and CE.  Fluid Labs: pH 8.0, LDH 166, protein 3.7, bloody fluid w 1132 WBC  Exudate    ASSESSMENT / PLAN:  Principal Problem:   Parapneumonic effusion- Exudate with culture and cytology pending  Active Problems:   CAD (coronary artery disease)   Carotid artery disease   Hypertensive heart disease   GERD (gastroesophageal reflux disease)   Abdominal aortic aneurysm without rupture   COPD GOLD II still smoking    Cigarette smoker   CKD (chronic kidney disease), stage IV   PAD (peripheral artery disease)   Sleep apnea   Atrial fibrillation   Chronic diastolic CHF (congestive heart failure)  Discussion: 69 yo life long smoker, known gold stage COPD 2(FEV1 1.51 51% predicted) , 6 mm left lung apex nodule followed with yearly CT scans, discharged from [facility] 7/12 with right sided pna(Klebsellia ) and returns with increased SOB, without fevers, chills , sweats or purulent sputum. He continues to smoke despite COPD and pulmonary nodule and has a plethora of health issues that include, CAD, CAF(on coumadin) CKD base creatine 2.35, PVD posy AAA repair and ABF. Due to his increased SOB, orthopnea,large rt pleural infusion PCCM asked top consult and performed  thoracentesis. Note Dr. Melvyn Novas is his pulmonary MD and most of his care is at [facility].  7/23- Significant residual effusion may warrant repeat tap or possibly catheter drainage. Need to know if this will reaccumulate- recommend f/u 2V CXR today or 7/24 Concern significance of chill last night- recommend f/u CBC and watch for bacteremia/ active infection. We will check again 7/25. Call sooner if needed.  Deneise Lever, MD [phone] After 3:00 PM  page [phone] 12/14/2014, 7:49 AM

## 2014-12-15 DIAGNOSIS — J90 Pleural effusion, not elsewhere classified: Secondary | ICD-10-CM

## 2014-12-15 LAB — PROTIME-INR
INR: 1.72 — ABNORMAL HIGH (ref 0.00–1.49)
Prothrombin Time: 20.1 seconds — ABNORMAL HIGH (ref 11.6–15.2)

## 2014-12-15 LAB — BASIC METABOLIC PANEL
Anion gap: 8 (ref 5–15)
BUN: 48 mg/dL — ABNORMAL HIGH (ref 6–20)
CO2: 27 mmol/L (ref 22–32)
Calcium: 8.9 mg/dL (ref 8.9–10.3)
Chloride: 96 mmol/L — ABNORMAL LOW (ref 101–111)
Creatinine, Ser: 2.54 mg/dL — ABNORMAL HIGH (ref 0.61–1.24)
GFR calc Af Amer: 28 mL/min — ABNORMAL LOW (ref >60)
GFR calc non Af Amer: 24 mL/min — ABNORMAL LOW (ref >60)
Glucose, Bld: 100 mg/dL — ABNORMAL HIGH (ref 65–99)
Potassium: 4.8 mmol/L (ref 3.5–5.1)
Sodium: 131 mmol/L — ABNORMAL LOW (ref 135–145)

## 2014-12-15 LAB — FUNGAL STAIN: Fungal Smear: NONE SEEN

## 2014-12-15 MED ORDER — IPRATROPIUM-ALBUTEROL 0.5-2.5 (3) MG/3ML IN SOLN
3.0000 mL | RESPIRATORY_TRACT | Status: DC | PRN
  Administered 2014-12-17: 3 mL via RESPIRATORY_TRACT
  Filled 2014-12-15: qty 3

## 2014-12-15 MED ORDER — IPRATROPIUM-ALBUTEROL 0.5-2.5 (3) MG/3ML IN SOLN
3.0000 mL | Freq: Four times a day (QID) | RESPIRATORY_TRACT | Status: DC
  Administered 2014-12-15 (×2): 3 mL via RESPIRATORY_TRACT
  Filled 2014-12-15 (×2): qty 3

## 2014-12-15 MED ORDER — WARFARIN SODIUM 7.5 MG PO TABS: 7.5000 mg | ORAL_TABLET | Freq: Once | ORAL | Status: DC

## 2014-12-15 MED ORDER — FUROSEMIDE 10 MG/ML IJ SOLN
40.0000 mg | Freq: Every day | INTRAMUSCULAR | Status: DC
  Administered 2014-12-15 – 2014-12-20 (×6): 40 mg via INTRAVENOUS
  Filled 2014-12-15 (×6): qty 4

## 2014-12-15 MED ORDER — WARFARIN - PHARMACIST DOSING INPATIENT: Freq: Every day | Status: DC

## 2014-12-15 MED ORDER — IPRATROPIUM-ALBUTEROL 0.5-2.5 (3) MG/3ML IN SOLN
3.0000 mL | Freq: Three times a day (TID) | RESPIRATORY_TRACT | Status: DC
  Administered 2014-12-16: 3 mL via RESPIRATORY_TRACT
  Filled 2014-12-15 (×2): qty 3

## 2014-12-15 NOTE — Progress Notes (Signed)
Subjective: Patient still complaining of dyspnea this morning. Reports no change from yesterday. Had no further chills overnight, has remained afebril. States that the chills began after receiving Ceftriaxone but reports chills overnight on 7/22 and did not receive ABX until yesterday morning.   Objective: Vital signs in last 24 hours: Filed Vitals:   12/14/14 1650 12/14/14 2048 12/15/14 0500 12/15/14 0809  BP: 121/63 117/63 134/69 147/76  Pulse: 62 63 60 63  Temp: 99.5 F (37.5 C) 98.7 F (37.1 C) 98.6 F (37 C) 98.1 F (36.7 C)  TempSrc: Oral Oral Oral Oral  Resp: 17 18 18 17   Height:      Weight:  82.872 kg (182 lb 11.2 oz)    SpO2: 95% 93% 92% 98%   Weight change: 0.544 kg (1 lb 3.2 oz)  Intake/Output Summary (Last 24 hours) at 12/15/14 0915 Last data filed at 12/15/14 [id number]  Gross per 24 hour  Intake   2126 ml  Output   1800 ml  Net    326 ml   Physical Exam GENERAL- alert, co-operative, appears as stated age, not in any distress. CARDIAC- Irregularly irregular rhythm, no murmurs, rubs or gallops. RESP- Decreased breath sounds on the right, mild crackles heard throughout ABDOMEN- Soft, nontender, no guarding or rebound, no palpable masses or organomegaly, bowel sounds present. EXTREMITIES- pulse 2+, symmetric, 2+ pitting edema to knees bilaterally SKIN- Warm, dry, No rash or lesion. PSYCH- Normal mood and affect, appropriate thought content and speech.  Lab Results: Basic Metabolic Panel:  Recent Labs Lab 12/13/14 0521 12/15/14 0638  NA 138 131*  K 4.7 4.8  CL 101 96*  CO2 25 27  GLUCOSE 73 100*  BUN 49* 48*  CREATININE 2.71* 2.54*  CALCIUM 9.3 8.9   Liver Function Tests:  Recent Labs Lab 12/12/14 1710 12/13/14 1329 12/13/14 1650  AST 18  --   --   ALT 16*  --   --   ALKPHOS 101  --   --   BILITOT 0.6  --   --   PROT 7.7 7.6 6.8  ALBUMIN 3.4* 3.4*  --    CBC:  Recent Labs Lab 12/12/14 1710 12/14/14 0956  WBC 10.9* 11.8*  NEUTROABS  8.5*  --   HGB 15.0 12.9*  HCT 45.3 39.9  MCV 88.5 88.9  PLT 329 300   Fasting Lipid Panel:  Recent Labs Lab 12/13/14 1650  CHOL 143   Coagulation:  Recent Labs Lab 12/12/14 1710 12/13/14 0521 12/14/14 0331 12/15/14 0638  LABPROT 27.9* 30.4* 24.8* 20.1*  INR 2.65* 2.97* 2.27* 1.72*   Micro Results: Recent Results (from the past 240 hour(s))  AFB culture with smear     Status: None (Preliminary result)   Collection Time: 12/13/14  4:12 PM  Result Value Ref Range Status   Specimen Description PLEURAL FLUID  Final   Special Requests NONE  Final   Acid Fast Smear   Final    NO ACID FAST BACILLI SEEN Performed at [facility]    Culture   Final    CULTURE WILL BE EXAMINED FOR 6 WEEKS BEFORE ISSUING A FINAL REPORT Performed at [facility]    Report Status PENDING  Incomplete  Body fluid culture     Status: None (Preliminary result)   Collection Time: 12/13/14  4:12 PM  Result Value Ref Range Status   Specimen Description PLEURAL FLUID  Final   Special Requests NONE  Final   Gram Stain  Final    RARE WBC PRESENT,BOTH PMN AND MONONUCLEAR NO ORGANISMS SEEN CONFIRMED BY M.VESTAL    Culture NO GROWTH < 24 HOURS  Final   Report Status PENDING  Incomplete  Fungal stain     Status: None   Collection Time: 12/13/14  4:12 PM  Result Value Ref Range Status   Specimen Description PLEURAL FLUID  Final   Special Requests NONE  Final   Fungal Smear   Final    NO YEAST OR FUNGAL ELEMENTS SEEN Performed at [facility]    Report Status 12/15/2014 FINAL  Final   Studies/Results: Dg Chest 2 View  12/14/2014   CLINICAL DATA:  Shortness of breath for 2 days. Status post thoracentesis 12/13/2014.  EXAM: CHEST  2 VIEW  COMPARISON:  Single view of the chest 12/13/2014. CT chest 12/12/2014.  FINDINGS: Moderate right pleural effusion and basilar airspace disease appear unchanged compared to yesterday's examination. The left lung is clear. There is  cardiomegaly. The patient is status post CABG.  IMPRESSION: No change in a moderate right pleural effusion and basilar airspace disease.   Electronically Signed   By: Inge Rise M.D.   On: 12/14/2014 10:17   Dg Chest Port 1 View  12/13/2014   CLINICAL DATA:  Cough, shortness of breath, post thoracentesis, history hypertension, CHF, CABG, MI, chronic kidney disease  EXAM: PORTABLE CHEST - 1 VIEW  COMPARISON:  Portable exam 1609 hours compared to 12/12/2014  FINDINGS: Enlargement of cardiac silhouette post CABG.  Atherosclerotic calcification and elongation of thoracic aorta.  Pulmonary vascularity normal.  Decrease in RIGHT pleural effusion and basilar atelectasis post thoracentesis.  No pneumothorax.  LEFT lung clear.  Bones unremarkable.  IMPRESSION: Decrease in RIGHT pleural effusion and basilar atelectasis post thoracentesis without evidence of pneumothorax.  Enlargement of cardiac silhouette post CABG.   Electronically Signed   By: Lavonia Dana M.D.   On: 12/13/2014 16:20   Medications: I have reviewed the patient's current medications. Scheduled Meds: . amLODipine  10 mg Oral QAC breakfast  . atorvastatin  80 mg Oral QHS  . bisoprolol  5 mg Oral Daily  . cefTRIAXone (ROCEPHIN)  IV  2 g Intravenous Q24H  . furosemide  40 mg Intravenous Daily  . ipratropium-albuterol  3 mL Nebulization Q6H  . pantoprazole  40 mg Oral Daily  . potassium chloride SA  20 mEq Oral Daily  . sodium chloride  3 mL Intravenous Q12H   Continuous Infusions:  PRN Meds:.acetaminophen **OR** acetaminophen Assessment/Plan: Principal Problem:   Parapneumonic effusion Active Problems:   CAD (coronary artery disease)   Carotid artery disease   Hypertensive heart disease   GERD (gastroesophageal reflux disease)   Abdominal aortic aneurysm without rupture   COPD GOLD II still smoking    Cigarette smoker   CKD (chronic kidney disease), stage IV   PAD (peripheral artery disease)   Sleep apnea   Atrial  fibrillation   Chronic diastolic CHF (congestive heart failure)   Pleural effusion  Pleural effusion: Had thoracocenthesis 12/14/2014. Symptomatically not improving, CXR yesterday showing no improvement from previous CXR. Consulted CVT for possible VATS or thoracotomy.  - Repeat chest xray yesterday stabe right pleural effusion, but appears improved compared to yesterday - Continue IV rocephin for now, considering prior blood cultures of klebsiella. - Pleural fluid culutres: AFB negative, Fungal negative, cultures no growth at two days - D/c warfarin pending VATS/Thoractomy - Appreciate CVT consult - BMET and CBC in AM  Chronic diastolic CHF (congestive  heart failure): Diastolic heart failure, Grade 2.  - IV Lasix - Bmet Check - Strict I&O - Daily weights - Continue Amlodipine 10mg   - Cont Bisoprolol 5mg  daily  AKI on CKD (chronic kidney disease), stage IV: SCr at 2.54 improved from 2.71 yesterday. Will continue to monitor. - IV Lasix - Bmet  Carotid artery disease- s/p CABG - No active chest pain, initial troponin negative, EKG without any signs of new ischemia. - Continue home medications, do not suspect ACS. - Atorva 80mg  - Bisoprolol 5mg  daily - Amlodipine 10mg   Atrial fibrillation: INR 1.72. Rate controlled on Bisoprolol at home,  - CHADSVasc at least 4 - Holding coumadin until after VATS/thoractomy - Continue bisoprolol  COPD GOLD II still smoking. PT eval as pt will require home O2 with activities.  - Will need home O2 on discharge - Continue Spirvia - Goal O2 sat 89-94% - Albuterol PRN  Cigarette smoker - Needs to stop smoking. - RN to provide smoking cessation education  Sleep apnea - Patient says he does not want CPAP while in the hospital  FEN/GI: - HH - Protonix  Dispo: Disposition is deferred at this time, awaiting improvement of current medical problems.  Anticipated discharge in approximately 1-3 day(s).   The patient does have a current PCP  Christain Sacramento, MD) and does need an [facility] hospital follow-up appointment after discharge.  The patient does not have transportation limitations that hinder transportation to clinic appointments.   LOS: 3 days   Maryellen Pile, MD 12/15/2014, 9:15 AM

## 2014-12-15 NOTE — Consult Note (Signed)
[address]       [place] [address]             [phone]      Cardiothoracic Surgery Consultation  Reason for Consult: Right pleural effusion Referring Physician: Chinita Pester, MD  Barry Lawrence is an 69 y.o. male.  HPI:   The patient is a 69 year old veteran with a history of DM, ongoing smoking, hyperlipidemia, stage III CKD with a creat of 2.5, OSA on CPAP, CAD s/p CABG x 6 by Dr. Ron Agee in 1996 and aortoiliac vascular disease s/p ABF bypass by Dr. [name] in 05/2014. The patient was admitted for RLL Klebsiella pneumonia on 11/29/2014 and discharged on 7/12. He says he started developing shortness of breath shortly after going home and eventually went to Dr. Kathryne Eriksson who recommended a follow up CXR which showed a large right pleural effusion. He was readmitted on 7/21 and had a CT scan that showed a large right pleural effusion that was partially loculated with consolidation and volume loss of the RLL and RML and part of the RUL. He had a right thoracentesis on 7/22 by Dr. Nelda Marseille removing 1500 cc of bloody fluid consistent with exudate. A follow up CXR yesterday showed a persistent moderate right pleural effusion. He continues to complain of shortness of breath. He had some chills yesterday.   Past Medical History  Diagnosis Date  . CAD (coronary artery disease)     myoview 04/21/11-normal, EF49%  . S/P CABG (coronary artery bypass graft) 1996  . Hyperlipemia   . Polycythemia     H/O  . GERD (gastroesophageal reflux disease)   . Tobacco abuse   . CKD (chronic kidney disease), stage III   . Myocardial infarction 1996  . OSA on CPAP     CPAP q night , CPAP is through [place], states doesn't remember when he had the last study  . Pneumonia     hosp. as a child with pneumonia, not since   . Diabetes     BORDERLINE  . Headache     uses [employer], states he is lessening what he is taking for prep. for surgery   . Arthritis     in his back  . Hypertension       stress test- done 04/2014, followed by Dr. Gwenlyn Found  . Carotid stenosis 01/15/08    right endarterectomy-Dr. Amedeo Plenty  . PAD (peripheral artery disease) 06/12/09    a. 11/22/07 PTA & stenting right external iliac artery;bilateral iliac & PTI and stenting 1997;right ICA =>50% reduction,right SFA >50%,left ICA at stent => 50% reduction,left EIA => 50% reduction,left ATA occluded, left SFA mid > 49% reduction;  03/2014 Angio: LRA 90, patent L Iliac stent and distal RCIA stent, large saccular AAA.  [name] Abdominal aortic aneurysm 01/28/10; 02/05/14    4.3x4.6cm;  now measuring 5.5 cm, status post aortobifemoral bypass grafting in January 2016 by Dr. problem along with left renal artery bypass    Past Surgical History  Procedure Laterality Date  . Carotid endarterectomy Right 01/15/08  . Lumbar disc surgery  1998  . Pv angio  11/22/2007    PTA/ stenting of right common iliac artery with a 10x41m Smart stent and postdilated with a [id number] balloon; high grade calcified stenosis of the RICA  . Colonoscopy N/A 07/09/2013    Procedure: COLONOSCOPY;  Surgeon: JyJuanita CraverMD;  Location: [facility] ENDOSCOPY;  Service: Endoscopy;  Laterality: N/A;  . Pv angio  04/01/14    AAA, Lt renal artery stenosis, patent iliacs  . Abdominal aortagram N/A 04/01/2014    Procedure: ABDOMINAL Maxcine Ham;  Surgeon: [name], MD;  Location: [facility] C[facility] LAB;  Service: Cardiovascular;  Laterality: N/A;  . Back surgery  1988    at [facility]  . Coronary artery bypass graft  1996    LIMA to LAD, sequential vein to OM1 and 2 as well as acure marginal branch and diag branch  . Aorta - bilateral femoral artery bypass graft N/A 05/30/2014    Procedure: AORTOBIFEMORAL BYPASS GRAFT;  Surgeon: Serafina Mitchell, MD;  Location: [facility];  Service: Vascular;  Laterality: N/A;  . Aortic/renal bypass Left 05/30/2014    Procedure: LEFT RENAL ARTERY BYPASS;  Surgeon: Serafina Mitchell, MD;  Location: [facility] OR;  Service: Vascular;  Laterality: Left;    Family  History  Problem Relation Age of Onset  . Heart failure Father     Social History:  reports that he has been smoking Cigarettes.  He has a 56 pack-year smoking history. He has never used smokeless tobacco. He reports that he does not drink alcohol or use illicit drugs.  Allergies:  Allergies  Allergen Reactions  . Niaspan [Niacin Er] Hives    Medications:  I have reviewed the patient's current medications. Prior to Admission:  Prescriptions prior to admission  Medication Sig Dispense Refill Last Dose  . albuterol (PROVENTIL HFA;VENTOLIN HFA) 108 (90 BASE) MCG/ACT inhaler Inhale 2 puffs into the lungs every 6 (six) hours as needed for wheezing or shortness of breath. 1 Inhaler 2 Past Week at Unknown time  . amLODipine (NORVASC) 10 MG tablet Take 10 mg by mouth daily before breakfast.    12/12/2014 at Unknown time  . Aspirin-Acetaminophen-Caffeine (GOODY HEADACHE PO) as needed.   Past Week at Unknown time  . atorvastatin (LIPITOR) 80 MG tablet Take 80 mg by mouth at bedtime.    12/11/2014 at Unknown time  . bisoprolol (ZEBETA) 5 MG tablet Take 5 mg by mouth daily.  0 12/12/2014 at 0800  . carvedilol (COREG) 12.5 MG tablet Take 12.5 mg by mouth daily.   12/12/2014 at 0800  . furosemide (LASIX) 40 MG tablet Take by mouth 2 (two) times daily. 60 mg q am and 40 mg q pm   12/12/2014 at Unknown time  . omeprazole (PRILOSEC) 20 MG capsule Take 20 mg by mouth at bedtime.    12/11/2014 at Unknown time  . potassium chloride SA (K-DUR,KLOR-CON) 20 MEQ tablet Take 20 mEq by mouth daily.   12/12/2014 at Unknown time  . warfarin (COUMADIN) 5 MG tablet Take 1 tablet (5 mg total) by mouth one time only at 6 PM. 30 tablet 3 12/11/2014 at Unknown time  . Tiotropium Bromide Monohydrate (SPIRIVA RESPIMAT) 2.5 MCG/ACT AERS Inhale 2 puffs into the lungs daily. (Patient not taking: Reported on 12/12/2014) 1 Inhaler 5 Not Taking at Unknown time   Scheduled: . amLODipine  10 mg Oral QAC breakfast  . atorvastatin  80 mg  Oral QHS  . bisoprolol  5 mg Oral Daily  . cefTRIAXone (ROCEPHIN)  IV  2 g Intravenous Q24H  . furosemide  40 mg Intravenous Daily  . ipratropium-albuterol  3 mL Nebulization Q6H  . pantoprazole  40 mg Oral Daily  . potassium chloride SA  20 mEq Oral Daily  . sodium chloride  3 mL Intravenous Q12H  . Warfarin - Pharmacist Dosing Inpatient   Does not apply q1800   Continuous:  WYO:[id number] **OR** acetaminophen Anti-infectives    Start     Dose/Rate Route Frequency Ordered Stop   12/14/14 1100  cefTRIAXone (ROCEPHIN) 2 g in dextrose 5 % 50 mL IVPB - Premix     2 g 100 mL/hr over 30 Minutes Intravenous Every 24 hours 12/14/14 1012     12/14/14 1015  cefTRIAXone (ROCEPHIN) 1 g in dextrose 5 % 50 mL IVPB - Premix  Status:  Discontinued     1 g 100 mL/hr over 30 Minutes Intravenous Every 24 hours 12/14/14 1003 12/14/14 1011      Results for orders placed or performed during the hospital encounter of 12/12/14 (from the past 48 hour(s))  Lactate dehydrogenase (CSF, pleural or peritoneal fluid)     Status: Abnormal   Collection Time: 12/13/14  4:12 PM  Result Value Ref Range   LD, Fluid 166 (H) 3 - 23 U/L    Comment: (NOTE) Results should be evaluated in conjunction with serum values    Fluid Type-FLDH PLEURAL     Comment: FLUID CORRECTED ON 07/22 AT 1817: PREVIOUSLY REPORTED AS Pleural Fld   Protein, pleural or peritoneal fluid     Status: None   Collection Time: 12/13/14  4:12 PM  Result Value Ref Range   Total protein, fluid 3.7 g/dL    Comment: (NOTE) No normal range established for this test Results should be evaluated in conjunction with serum values    Fluid Type-FTP PLEURAL     Comment: FLUID CORRECTED ON 07/22 AT 1820: PREVIOUSLY REPORTED AS Pleural Fld   Body fluid cell count with differential     Status: Abnormal   Collection Time: 12/13/14  4:12 PM  Result Value Ref Range   Fluid Type-FCT PLEURAL     Comment: FLUID CORRECTED ON 07/22 AT 1817: PREVIOUSLY  REPORTED AS Body Fluid    Color, Fluid RED (A) YELLOW   Appearance, Fluid TURBID (A) CLEAR   WBC, Fluid 1132 (H) 0 - 1000 cu mm   Neutrophil Count, Fluid 7 0 - 25 %   Lymphs, Fluid 85 %   Monocyte-Macrophage-Serous Fluid 8 (L) 50 - 90 %   Eos, Fluid 0 %  AFB culture with smear     Status: None (Preliminary result)   Collection Time: 12/13/14  4:12 PM  Result Value Ref Range   Specimen Description PLEURAL FLUID    Special Requests NONE    Acid Fast Smear      NO ACID FAST BACILLI SEEN Performed at [facility]    Culture      CULTURE WILL BE EXAMINED FOR 6 WEEKS BEFORE ISSUING A FINAL REPORT Performed at [facility]    Report Status PENDING   Body fluid culture     Status: None (Preliminary result)   Collection Time: 12/13/14  4:12 PM  Result Value Ref Range   Specimen Description PLEURAL FLUID    Special Requests NONE    Gram Stain      RARE WBC PRESENT,BOTH PMN AND MONONUCLEAR NO ORGANISMS SEEN CONFIRMED BY M.VESTAL    Culture NO GROWTH 2 DAYS    Report Status PENDING   Fungal stain     Status: None   Collection Time: 12/13/14  4:12 PM  Result Value Ref Range   Specimen Description PLEURAL FLUID    Special Requests NONE    Fungal Smear      NO YEAST OR FUNGAL ELEMENTS SEEN Performed at [facility]    Report Status 12/15/2014  FINAL   pH, body fluid     Status: None   Collection Time: 12/13/14  4:21 PM  Result Value Ref Range   pH, Fluid Type      CORRECTED ON 07/22 AT 1834: PREVIOUSLY REPORTED AS BODY FLUID    Comment: CORRECTED ON 07/23 AT 0108: PREVIOUSLY REPORTED AS PLEURAL FLUID, CORRECTED ON 07/22 AT 1834: PREVIOUSLY REPORTED AS Body Fluid   pH, Fluid 8.00     Comment: Performed at [facility]  Glucose, pleural or peritoneal fluid     Status: None   Collection Time: 12/13/14  4:27 PM  Result Value Ref Range   Glucose, Fluid 66 mg/dL    Comment: (NOTE) No normal range established for this test Results should be  evaluated in conjunction with serum values    Fluid Type-FGLU PLEURAL     Comment: CORRECTED ON 07/23 AT 1213: PREVIOUSLY REPORTED AS Pleural Fld  Lactate dehydrogenase     Status: None   Collection Time: 12/13/14  4:50 PM  Result Value Ref Range   LDH 146 98 - 192 U/L  Protein, total     Status: None   Collection Time: 12/13/14  4:50 PM  Result Value Ref Range   Total Protein 6.8 6.5 - 8.1 g/dL  Cholesterol, total     Status: None   Collection Time: 12/13/14  4:50 PM  Result Value Ref Range   Cholesterol 143 0 - 200 mg/dL  Protime-INR     Status: Abnormal   Collection Time: 12/14/14  3:31 AM  Result Value Ref Range   Prothrombin Time 24.8 (H) 11.6 - 15.2 seconds   INR 2.27 (H) 0.00 - 1.49  CBC     Status: Abnormal   Collection Time: 12/14/14  9:56 AM  Result Value Ref Range   WBC 11.8 (H) 4.0 - 10.5 K/uL   RBC 4.49 4.22 - 5.81 MIL/uL   Hemoglobin 12.9 (L) 13.0 - 17.0 g/dL   HCT 39.9 39.0 - 52.0 %   MCV 88.9 78.0 - 100.0 fL   MCH 28.7 26.0 - 34.0 pg   MCHC 32.3 30.0 - 36.0 g/dL   RDW 15.1 11.5 - 15.5 %   Platelets 300 150 - 400 K/uL  Protime-INR     Status: Abnormal   Collection Time: 12/15/14  6:38 AM  Result Value Ref Range   Prothrombin Time 20.1 (H) 11.6 - 15.2 seconds   INR 1.72 (H) 0.00 - 6.26  Basic metabolic panel     Status: Abnormal   Collection Time: 12/15/14  6:38 AM  Result Value Ref Range   Sodium 131 (L) 135 - 145 mmol/L   Potassium 4.8 3.5 - 5.1 mmol/L   Chloride 96 (L) 101 - 111 mmol/L   CO2 27 22 - 32 mmol/L   Glucose, Bld 100 (H) 65 - 99 mg/dL   BUN 48 (H) 6 - 20 mg/dL   Creatinine, Ser 2.54 (H) 0.61 - 1.24 mg/dL   Calcium 8.9 8.9 - 10.3 mg/dL   GFR calc non Af Amer 24 (L) >60 mL/min   GFR calc Af Amer 28 (L) >60 mL/min    Comment: (NOTE) The eGFR has been calculated using the CKD EPI equation. This calculation has not been validated in all clinical situations. eGFR's persistently <60 mL/min signify possible Chronic Kidney Disease.    Anion  gap 8 5 - 15    Dg Chest 2 View  12/14/2014   CLINICAL DATA:  Shortness of breath for 2  days. Status post thoracentesis 12/13/2014.  EXAM: CHEST  2 VIEW  COMPARISON:  Single view of the chest 12/13/2014. CT chest 12/12/2014.  FINDINGS: Moderate right pleural effusion and basilar airspace disease appear unchanged compared to yesterday's examination. The left lung is clear. There is cardiomegaly. The patient is status post CABG.  IMPRESSION: No change in a moderate right pleural effusion and basilar airspace disease.   Electronically Signed   By: Inge Rise M.D.   On: 12/14/2014 10:17   Dg Chest Port 1 View  12/13/2014   CLINICAL DATA:  Cough, shortness of breath, post thoracentesis, history hypertension, CHF, CABG, MI, chronic kidney disease  EXAM: PORTABLE CHEST - 1 VIEW  COMPARISON:  Portable exam 1609 hours compared to 12/12/2014  FINDINGS: Enlargement of cardiac silhouette post CABG.  Atherosclerotic calcification and elongation of thoracic aorta.  Pulmonary vascularity normal.  Decrease in RIGHT pleural effusion and basilar atelectasis post thoracentesis.  No pneumothorax.  LEFT lung clear.  Bones unremarkable.  IMPRESSION: Decrease in RIGHT pleural effusion and basilar atelectasis post thoracentesis without evidence of pneumothorax.  Enlargement of cardiac silhouette post CABG.   Electronically Signed   By: Lavonia Dana M.D.   On: 12/13/2014 16:20    Review of Systems  Constitutional: Positive for chills and malaise/fatigue. Negative for fever, weight loss and diaphoresis.  HENT: Negative.   Eyes: Negative.   Respiratory: Positive for cough and shortness of breath. Negative for hemoptysis and sputum production.   Cardiovascular: Negative.   Gastrointestinal: Negative.   Genitourinary: Negative.   Musculoskeletal: Negative.   Skin:       Had cellulitis of the left leg in May 2016  Neurological: Negative.   Endo/Heme/Allergies: Negative.   Psychiatric/Behavioral: Negative.    Blood  pressure 147/76, pulse 63, temperature 98.1 F (36.7 C), temperature source Oral, resp. rate 17, height 5' 7" (1.702 m), weight 82.872 kg (182 lb 11.2 oz), SpO2 98 %. Physical Exam  Constitutional: He is oriented to person, place, and time.  Chronically ill appearing gentleman in no distress. Easily went from lying to sitting on the side of the bed without help.  HENT:  Head: Normocephalic and atraumatic.  Eyes: EOM are normal. Pupils are equal, round, and reactive to light.  Neck: Normal range of motion. Neck supple. No JVD present.  Cardiovascular: Normal rate, regular rhythm and normal heart sounds.   No murmur heard. Respiratory: Effort normal.  Decreased breath sounds over the right chest  GI: Soft. Bowel sounds are normal. He exhibits distension. He exhibits no mass. There is no tenderness.  Musculoskeletal: Normal range of motion. He exhibits no edema.  Lymphadenopathy:    He has no cervical adenopathy.  Neurological: He is alert and oriented to person, place, and time.  Skin: Skin is warm and dry.  Psychiatric: He has a normal mood and affect.   CLINICAL DATA: Shortness of breath for 2 days. Status post thoracentesis 12/13/2014.  EXAM: CHEST 2 VIEW  COMPARISON: Single view of the chest 12/13/2014. CT chest 12/12/2014.  FINDINGS: Moderate right pleural effusion and basilar airspace disease appear unchanged compared to yesterday's examination. The left lung is clear. There is cardiomegaly. The patient is status post CABG.  IMPRESSION: No change in a moderate right pleural effusion and basilar airspace disease.   Electronically Signed  By: Inge Rise M.D.  On: 12/14/2014 10:17       Assessment/Plan:  I have personally reviewed his CT scan and CXR, reviewed his medical records and examined him.  He has a large partially loculated right pleural effusion that is most likely parapneumonic after recent partially treated pneumonia and developing  into an empyema. He has a moderate residual effusion after thoracentesis which is probably loculated. I think the best treatment to completely resolve this effusion and pneumonia is surgical drainage via VATS or possibly a small thoracotomy. I don't think a chest tube is going to resolve this. He is on coumadin for atrial fibrillation and this should be held so that surgery can be done once INR drops, possibly Tuesday. I discussed all of this with him and he is amenable to surgical drainage.  Gaye Pollack 12/15/2014, 2:35 PM

## 2014-12-15 NOTE — Progress Notes (Signed)
ANTICOAGULATION CONSULT NOTE - Follow Up Consult  Pharmacy Consult for Coumadin Indication: atrial fibrillation  Allergies  Allergen Reactions  . Niaspan [Niacin Er] Hives    Patient Measurements: Height: 5\' 7"  (170.2 cm) Weight: 182 lb 11.2 oz (82.872 kg) IBW/kg (Calculated) : 66.1 Heparin Dosing Weight:   Vital Signs: Temp: 98.1 F (36.7 C) (07/24 0809) Temp Source: Oral (07/24 0809) BP: 147/76 mmHg (07/24 0809) Pulse Rate: 63 (07/24 0809)  Labs:  Recent Labs  12/12/14 1710 12/13/14 0521 12/14/14 0331 12/14/14 0956 12/15/14 0638  HGB 15.0  --   --  12.9*  --   HCT 45.3  --   --  39.9  --   PLT 329  --   --  300  --   LABPROT 27.9* 30.4* 24.8*  --  20.1*  INR 2.65* 2.97* 2.27*  --  1.72*  CREATININE 2.78* 2.71*  --   --  2.54*    Estimated Creatinine Clearance: 28.7 mL/min (by C-G formula based on Cr of 2.54).   Medications:  Scheduled:  . amLODipine  10 mg Oral QAC breakfast  . atorvastatin  80 mg Oral QHS  . bisoprolol  5 mg Oral Daily  . cefTRIAXone (ROCEPHIN)  IV  2 g Intravenous Q24H  . furosemide  40 mg Intravenous Daily  . ipratropium-albuterol  3 mL Nebulization Q6H  . pantoprazole  40 mg Oral Daily  . potassium chloride SA  20 mEq Oral Daily  . sodium chloride  3 mL Intravenous Q12H    Assessment: 69yo s/p thoracentesis for large pleural effusion, to resume Coumadin for AFib today after being ordered & d/c'd 7/23 d/t concern pt would need further intervention. INR was therapeutic on home dose of 5mg  daily, last dose was on 7/20. He also received FFP x 2 on 7/22 for INR 2.97 & need for thoracentesis. Hg 12.9, pltc wnl on 7/23 & INR 1.72 today. Pt is on Ceftriaxone for possible empyema.  Goal of Therapy:  INR 2-3 Monitor platelets by anticoagulation protocol: Yes   Plan:  Coumadin 7.5mg   Daily INR Watch for s/s of bleeding  Gracy Bruins, PharmD [facility]

## 2014-12-16 DIAGNOSIS — M25551 Pain in right hip: Secondary | ICD-10-CM

## 2014-12-16 DIAGNOSIS — Z9889 Other specified postprocedural states: Secondary | ICD-10-CM

## 2014-12-16 DIAGNOSIS — R600 Localized edema: Secondary | ICD-10-CM

## 2014-12-16 LAB — BODY FLUID CULTURE: Culture: NO GROWTH

## 2014-12-16 LAB — BASIC METABOLIC PANEL
Anion gap: 7 (ref 5–15)
BUN: 42 mg/dL — ABNORMAL HIGH (ref 6–20)
CO2: 28 mmol/L (ref 22–32)
Calcium: 9 mg/dL (ref 8.9–10.3)
Chloride: 99 mmol/L — ABNORMAL LOW (ref 101–111)
Creatinine, Ser: 2.22 mg/dL — ABNORMAL HIGH (ref 0.61–1.24)
GFR calc Af Amer: 33 mL/min — ABNORMAL LOW (ref >60)
GFR calc non Af Amer: 29 mL/min — ABNORMAL LOW (ref >60)
Glucose, Bld: 110 mg/dL — ABNORMAL HIGH (ref 65–99)
Potassium: 4.5 mmol/L (ref 3.5–5.1)
Sodium: 134 mmol/L — ABNORMAL LOW (ref 135–145)

## 2014-12-16 LAB — PROTIME-INR
INR: 1.49 (ref 0.00–1.49)
Prothrombin Time: 18.1 seconds — ABNORMAL HIGH (ref 11.6–15.2)

## 2014-12-16 LAB — CBC
HCT: 42.1 % (ref 39.0–52.0)
Hemoglobin: 13.4 g/dL (ref 13.0–17.0)
MCH: 28.3 pg (ref 26.0–34.0)
MCHC: 31.8 g/dL (ref 30.0–36.0)
MCV: 88.8 fL (ref 78.0–100.0)
Platelets: 237 10*3/uL (ref 150–400)
RBC: 4.74 MIL/uL (ref 4.22–5.81)
RDW: 15.2 % (ref 11.5–15.5)
WBC: 4.6 10*3/uL (ref 4.0–10.5)

## 2014-12-16 MED ORDER — TRAMADOL HCL 50 MG PO TABS
50.0000 mg | ORAL_TABLET | Freq: Every evening | ORAL | Status: DC | PRN
  Administered 2014-12-16: 50 mg via ORAL
  Filled 2014-12-16: qty 1

## 2014-12-16 MED ORDER — CETYLPYRIDINIUM CHLORIDE 0.05 % MT LIQD
7.0000 mL | Freq: Two times a day (BID) | OROMUCOSAL | Status: DC
  Administered 2014-12-17 – 2014-12-20 (×6): 7 mL via OROMUCOSAL

## 2014-12-16 NOTE — Care Management Important Message (Signed)
Important Message  Patient Details  Name: Barry Lawrence MRN: [id number] Date of Birth: 1945/11/21   Medicare Important Message Given:  Yes-second notification given    Pricilla Handler 12/16/2014, 2:45 PM

## 2014-12-16 NOTE — Progress Notes (Signed)
Subjective: Patient still complaining of dyspnea today. No change from previous. Complains of difficulty sleeping due to right lower back and hip pain.   Objective: Vital signs in last 24 hours: Filed Vitals:   12/15/14 2148 12/16/14 0500 12/16/14 0800 12/16/14 1017  BP: 149/58 142/66 142/73   Pulse: 58 60 107   Temp: 98.3 F (36.8 C) 97.8 F (36.6 C) 98.2 F (36.8 C)   TempSrc: Oral Oral Oral   Resp: 18 18 17    Height:      Weight: 82.283 kg (181 lb 6.4 oz)     SpO2: 95% 93% 95% 97%   Weight change: -0.59 kg (-1 lb 4.8 oz)  Intake/Output Summary (Last 24 hours) at 12/16/14 1453 Last data filed at 12/16/14 1333  Gross per 24 hour  Intake    890 ml  Output   2150 ml  Net  -1260 ml   Physical Exam GENERAL- alert, co-operative, appears as stated age, not in any distress. CARDIAC- Irregularly irregular rhythm, no murmurs, rubs or gallops. RESP- Decreased breath sounds on the right, no crackles or wheezing ABDOMEN- Soft, nontender, no guarding or rebound, no palpable masses or organomegaly, bowel sounds present. EXTREMITIES- pulse 2+, symmetric, 2+ pitting edema to knees bilaterally SKIN- Warm, dry, No rash or lesion. PSYCH- Normal mood and affect, appropriate thought content and speech.  Lab Results: Basic Metabolic Panel:  Recent Labs Lab 12/15/14 0638 12/16/14 0558  NA 131* 134*  K 4.8 4.5  CL 96* 99*  CO2 27 28  GLUCOSE 100* 110*  BUN 48* 42*  CREATININE 2.54* 2.22*  CALCIUM 8.9 9.0   Liver Function Tests:  Recent Labs Lab 12/12/14 1710 12/13/14 1329 12/13/14 1650  AST 18  --   --   ALT 16*  --   --   ALKPHOS 101  --   --   BILITOT 0.6  --   --   PROT 7.7 7.6 6.8  ALBUMIN 3.4* 3.4*  --    No results for input(s): LIPASE, AMYLASE in the last 168 hours. No results for input(s): AMMONIA in the last 168 hours. CBC:  Recent Labs Lab 12/12/14 1710 12/14/14 0956 12/16/14 0558  WBC 10.9* 11.8* 4.6  NEUTROABS 8.5*  --   --   HGB 15.0 12.9* 13.4    HCT 45.3 39.9 42.1  MCV 88.5 88.9 88.8  PLT 329 300 237   Fasting Lipid Panel:  Recent Labs Lab 12/13/14 1650  CHOL 143   Coagulation:  Recent Labs Lab 12/13/14 0521 12/14/14 0331 12/15/14 0638 12/16/14 0558  LABPROT 30.4* 24.8* 20.1* 18.1*  INR 2.97* 2.27* 1.72* 1.49   Micro Results: Recent Results (from the past 240 hour(s))  AFB culture with smear     Status: None (Preliminary result)   Collection Time: 12/13/14  4:12 PM  Result Value Ref Range Status   Specimen Description PLEURAL FLUID  Final   Special Requests NONE  Final   Acid Fast Smear   Final    NO ACID FAST BACILLI SEEN Performed at [facility]    Culture   Final    CULTURE WILL BE EXAMINED FOR 6 WEEKS BEFORE ISSUING A FINAL REPORT Performed at [facility]    Report Status PENDING  Incomplete  Body fluid culture     Status: None   Collection Time: 12/13/14  4:12 PM  Result Value Ref Range Status   Specimen Description PLEURAL FLUID  Final   Special Requests NONE  Final  Gram Stain   Final    RARE WBC PRESENT,BOTH PMN AND MONONUCLEAR NO ORGANISMS SEEN CONFIRMED BY M.[facility]STAL    Culture NO GROWTH 3 DAYS  Final   Report Status 12/16/2014 FINAL  Final  Fungal stain     Status: None   Collection Time: 12/13/14  4:12 PM  Result Value Ref Range Status   Specimen Description PLEURAL FLUID  Final   Special Requests NONE  Final   Fungal Smear   Final    NO YEAST OR FUNGAL ELEMENTS SEEN Performed at [facility]    Report Status 12/15/2014 FINAL  Final   Studies/Results: No results found. Medications: I have reviewed the patient's current medications. Scheduled Meds: . amLODipine  10 mg Oral QAC breakfast  . atorvastatin  80 mg Oral QHS  . bisoprolol  5 mg Oral Daily  . cefTRIAXone (ROCEPHIN)  IV  2 g Intravenous Q24H  . furosemide  40 mg Intravenous Daily  . ipratropium-albuterol  3 mL Nebulization TID  . pantoprazole  40 mg Oral Daily  . potassium chloride SA  20  mEq Oral Daily  . sodium chloride  3 mL Intravenous Q12H   Continuous Infusions:  PRN Meds:.acetaminophen **OR** acetaminophen, ipratropium-albuterol, traMADol Assessment/Plan: Principal Problem:   Parapneumonic effusion Active Problems:   CAD (coronary artery disease)   Carotid artery disease   Hypertensive heart disease   GERD (gastroesophageal reflux disease)   Abdominal aortic aneurysm without rupture   COPD GOLD II still smoking    Cigarette smoker   CKD (chronic kidney disease), stage IV   PAD (peripheral artery disease)   Sleep apnea   Atrial fibrillation   Chronic diastolic CHF (congestive heart failure)   Pleural effusion  Pleural effusion: Had thoracocenthesis 12/14/2014. Symptomatically not improving. Cardiothoracic surgery consulted, plan for VATS.  - Continue IV rocephin for now, considering prior blood cultures of klebsiella. - Pleural fluid culutres: AFB negative, Fungal negative, cultures no growth at three days - D/c warfarin pending VATS/Thoractomy - Appreciate cardiothoracic surgery consult - Hgb stable at 0000000  Chronic diastolic CHF (congestive heart failure): Diastolic heart failure, Grade 2.  - IV Lasix - Cr improving despite lasix - Strict I&O - Daily weights - Continue Amlodipine 10mg   - Cont Bisoprolol 5mg  daily  AKI on CKD (chronic kidney disease), stage IV: SCr at 2.22, improved from 2.54 yesterday. Will continue to monitor. - IV Lasix  Carotid artery disease- s/p CABG - No active chest pain, initial troponin negative, EKG without any signs of new ischemia. - Continue home medications, do not suspect ACS. - Atorva 80mg  - Bisoprolol 5mg  daily - Amlodipine 10mg   Atrial fibrillation: INR 1.49. Rate controlled on Bisoprolol at home,  - CHADSVasc at least 4 - Holding coumadin until after VATS/thoractomy - Continue bisoprolol  COPD GOLD II still smoking. PT eval as pt will require home O2 with activities.  - Will need home O2 on  discharge - Continue Spirvia - Goal O2 sat 89-94% - Albuterol PRN  Right lower back/hip pain: - Complaining of pain that is keeping him from sleeping - tramadol 50 mg qHS  Cigarette smoker - Needs to stop smoking. - RN to provide smoking cessation education  Sleep apnea - Patient says he does not want CPAP while in the hospital  FEN/GI: - HH - Protonix  Dispo: Disposition is deferred at this time, awaiting improvement of current medical problems.  Anticipated discharge in approximately 1-3 day(s).   The patient does have a current PCP (  Christain Sacramento, MD) and does need an [facility] hospital follow-up appointment after discharge.  The patient does not have transportation limitations that hinder transportation to clinic appointments.    LOS: 4 days   Maryellen Pile, MD 12/16/2014, 2:53 PM

## 2014-12-16 NOTE — Progress Notes (Signed)
Subjective:  Still has shortness of breath and right sided chest pain.  Objective: Vital signs in last 24 hours: Temp:  [97.7 F (36.5 C)-98.3 F (36.8 C)] 97.7 F (36.5 C) (07/25 1931) Pulse Rate:  [58-107] 58 (07/25 1931) Cardiac Rhythm:  [-] Atrial fibrillation (07/25 0824) Resp:  [17-18] 18 (07/25 1931) BP: (142-149)/(58-73) 149/73 mmHg (07/25 1931) SpO2:  [93 %-97 %] 97 % (07/25 1931) Weight:  [82.283 kg (181 lb 6.4 oz)] 82.283 kg (181 lb 6.4 oz) (07/24 2148)  Hemodynamic parameters for last 24 hours:    Intake/Output from previous day: 07/24 0701 - 07/25 0700 In: 1130 [P.O.:1080; IV Piggyback:50] Out: 2150 [Urine:2150] Intake/Output this shift:    General appearance: alert and cooperative Heart: regular rate and rhythm, S1, S2 normal, no murmur, click, rub or gallop Lungs: diminished breath sounds right chest  Lab Results:  Recent Labs  12/14/14 0956 12/16/14 0558  WBC 11.8* 4.6  HGB 12.9* 13.4  HCT 39.9 42.1  PLT 300 237   BMET:  Recent Labs  12/15/14 0638 12/16/14 0558  NA 131* 134*  K 4.8 4.5  CL 96* 99*  CO2 27 28  GLUCOSE 100* 110*  BUN 48* 42*  CREATININE 2.54* 2.22*  CALCIUM 8.9 9.0    PT/INR:  Recent Labs  12/16/14 0558  LABPROT 18.1*  INR 1.49   ABG    Component Value Date/Time   PHART 7.283* 11/29/2014 0948   HCO3 21.9 11/29/2014 0948   TCO2 23 11/29/2014 0948   ACIDBASEDEF 5.0* 11/29/2014 0948   O2SAT 95.0 11/29/2014 0948   CBG (last 3)  No results for input(s): GLUCAP in the last 72 hours.  Assessment/Plan:  Large loculated right pleural effusion or empyema. Plan right VATS or thoracotomy for drainage tomorrow. I discussed the procedure with the patient including alternatives, benefits and risks and he agrees to proceed.  LOS: 4 days    Gaye Pollack 12/16/2014

## 2014-12-16 NOTE — Progress Notes (Signed)
[facility] P[facility]M  Chart reviewed.  Recent admission for pneumonia, now with lymphocyte predominant parapneumonic effusion which is partially loculated.  Thoracic surgery plans VATS.  Agree.  PCCM to sign off.  Roselie Awkward, MD [place] [facility] Pager: [phone] Cell: [phone] After 3pm or if no response, call [phone]

## 2014-12-16 NOTE — Progress Notes (Signed)
  Date: 12/16/2014  Patient name: Barry Lawrence  Medical record number: [id number]  Date of birth: 1946/05/03   This patient has been seen and the plan of care was discussed with the house staff. Please see their note for complete details. I concur with their findings with the following additions/corrections: Mr Drachenberg was sitting side of bed. C/O ongoing dyspnea and R post hip / buttock pain, worse at night. He has decrease breath sounds on R lung. + 2 edema to knees B but R > L. Cr trending down despite IV lasix. For VATS in AM.  Bartholomew Crews, MD 12/16/2014, 1:19 PM

## 2014-12-16 NOTE — Progress Notes (Signed)
ANTICOAGULATION CONSULT NOTE - Follow Up Consult  Pharmacy Consult for Coumadin Indication: atrial fibrillation  Allergies  Allergen Reactions  . Niaspan [Niacin Er] Hives    Patient Measurements: Height: 5\' 7"  (170.2 cm) Weight: 181 lb 6.4 oz (82.283 kg) IBW/kg (Calculated) : 66.1 Heparin Dosing Weight:   Vital Signs: Temp: 98.2 F (36.8 C) (07/25 0800) Temp Source: Oral (07/25 0800) BP: 142/73 mmHg (07/25 0800) Pulse Rate: 107 (07/25 0800)  Labs:  Recent Labs  12/14/14 0331 12/14/14 0956 12/15/14 0638 12/16/14 0558  HGB  --  12.9*  --  13.4  HCT  --  39.9  --  42.1  PLT  --  300  --  237  LABPROT 24.8*  --  20.1* 18.1*  INR 2.27*  --  1.72* 1.49  CREATININE  --   --  2.54* 2.22*    Estimated Creatinine Clearance: 32.7 mL/min (by C-G formula based on Cr of 2.22).   Medications:  Scheduled:  . amLODipine  10 mg Oral QAC breakfast  . atorvastatin  80 mg Oral QHS  . bisoprolol  5 mg Oral Daily  . cefTRIAXone (ROCEPHIN)  IV  2 g Intravenous Q24H  . furosemide  40 mg Intravenous Daily  . ipratropium-albuterol  3 mL Nebulization TID  . pantoprazole  40 mg Oral Daily  . potassium chloride SA  20 mEq Oral Daily  . sodium chloride  3 mL Intravenous Q12H    Assessment: 69yo s/p thoracentesis for large pleural effusion, to continue to hold warfarin for VATS procedure, possibly on Tuesday if INR appropriate (last dose was on 7/20).He also received FFP x 2 on 7/22 for INR 2.97 & need for thoracentesis.CBC wnl stable.   INR down to 1.49 this AM. Pt is also on Ceftriaxone for possible empyema, previous BC positive for klebsiella per note.  Coumadin ordered yesterday but was d/c'd.  Goal of Therapy:  INR 2-3 Monitor platelets by anticoagulation protocol: Yes   Plan:  Hold coumadin for VATS - on schedule for 7/26  Daily INR   Elicia Lamp, PharmD Clinical Pharmacist Pager [phone] 12/16/2014 11:36 AM

## 2014-12-17 ENCOUNTER — Inpatient Hospital Stay (HOSPITAL_COMMUNITY): Payer: Medicare Other

## 2014-12-17 ENCOUNTER — Inpatient Hospital Stay (HOSPITAL_COMMUNITY): Payer: Medicare Other | Admitting: Anesthesiology

## 2014-12-17 ENCOUNTER — Encounter (HOSPITAL_COMMUNITY): Payer: Self-pay | Admitting: Anesthesiology

## 2014-12-17 ENCOUNTER — Encounter (HOSPITAL_COMMUNITY)
Admission: EM | Disposition: A | Discharge status: Home / Self Care | Payer: Self-pay | Source: Home / Self Care | Attending: Internal Medicine

## 2014-12-17 HISTORY — PX: VIDEO ASSISTED THORACOSCOPY (VATS)/DECORTICATION: SHX6171

## 2014-12-17 HISTORY — PX: EMPYEMA DRAINAGE: SHX5097

## 2014-12-17 LAB — TYPE AND SCREEN
ABO/RH(D): A POS
Antibody Screen: NEGATIVE

## 2014-12-17 LAB — BASIC METABOLIC PANEL
Anion gap: 8 (ref 5–15)
BUN: 34 mg/dL — ABNORMAL HIGH (ref 6–20)
CO2: 29 mmol/L (ref 22–32)
Calcium: 9.3 mg/dL (ref 8.9–10.3)
Chloride: 99 mmol/L — ABNORMAL LOW (ref 101–111)
Creatinine, Ser: 1.96 mg/dL — ABNORMAL HIGH (ref 0.61–1.24)
GFR calc Af Amer: 39 mL/min — ABNORMAL LOW (ref >60)
GFR calc non Af Amer: 33 mL/min — ABNORMAL LOW (ref >60)
Glucose, Bld: 96 mg/dL (ref 65–99)
Potassium: 4.7 mmol/L (ref 3.5–5.1)
Sodium: 136 mmol/L (ref 135–145)

## 2014-12-17 LAB — GLUCOSE, CAPILLARY: Glucose-Capillary: 88 mg/dL (ref 65–99)

## 2014-12-17 LAB — CBC
HCT: 41.4 % (ref 39.0–52.0)
Hemoglobin: 13.3 g/dL (ref 13.0–17.0)
MCH: 28.6 pg (ref 26.0–34.0)
MCHC: 32.1 g/dL (ref 30.0–36.0)
MCV: 89 fL (ref 78.0–100.0)
Platelets: 240 10*3/uL (ref 150–400)
RBC: 4.65 MIL/uL (ref 4.22–5.81)
RDW: 15.3 % (ref 11.5–15.5)
WBC: 5.1 10*3/uL (ref 4.0–10.5)

## 2014-12-17 LAB — PROTIME-INR
INR: 1.34 (ref 0.00–1.49)
Prothrombin Time: 16.7 seconds — ABNORMAL HIGH (ref 11.6–15.2)

## 2014-12-17 SURGERY — VIDEO ASSISTED THORACOSCOPY (VATS)/DECORTICATION
Anesthesia: General | Site: Chest | Laterality: Right

## 2014-12-17 MED ORDER — GLYCOPYRROLATE 0.2 MG/ML IJ SOLN
INTRAMUSCULAR | Status: AC
  Filled 2014-12-17: qty 3

## 2014-12-17 MED ORDER — LEVALBUTEROL HCL 0.63 MG/3ML IN NEBU
0.6300 mg | INHALATION_SOLUTION | Freq: Four times a day (QID) | RESPIRATORY_TRACT | Status: DC
  Administered 2014-12-17 – 2014-12-20 (×12): 0.63 mg via RESPIRATORY_TRACT
  Filled 2014-12-17 (×18): qty 3

## 2014-12-17 MED ORDER — OXYCODONE HCL 5 MG PO TABS
ORAL_TABLET | ORAL | Status: AC
  Filled 2014-12-17: qty 1

## 2014-12-17 MED ORDER — 0.9 % SODIUM CHLORIDE (POUR BTL) OPTIME
TOPICAL | Status: DC | PRN
  Administered 2014-12-17: 2000 mL

## 2014-12-17 MED ORDER — FENTANYL CITRATE (PF) 100 MCG/2ML IJ SOLN
INTRAMUSCULAR | Status: AC
  Filled 2014-12-17: qty 2

## 2014-12-17 MED ORDER — GLYCOPYRROLATE 0.2 MG/ML IJ SOLN
INTRAMUSCULAR | Status: DC | PRN
  Administered 2014-12-17: .6 mg via INTRAVENOUS

## 2014-12-17 MED ORDER — ROCURONIUM BROMIDE 100 MG/10ML IV SOLN
INTRAVENOUS | Status: DC | PRN
  Administered 2014-12-17: 40 mg via INTRAVENOUS

## 2014-12-17 MED ORDER — ACETAMINOPHEN 160 MG/5ML PO SOLN
1000.0000 mg | Freq: Four times a day (QID) | ORAL | Status: DC
  Administered 2014-12-18: 1000 mg via ORAL
  Filled 2014-12-17 (×11): qty 40

## 2014-12-17 MED ORDER — PROPOFOL 10 MG/ML IV BOLUS
INTRAVENOUS | Status: DC | PRN
  Administered 2014-12-17: 20 mg via INTRAVENOUS
  Administered 2014-12-17: 70 mg via INTRAVENOUS
  Administered 2014-12-17: 20 mg via INTRAVENOUS

## 2014-12-17 MED ORDER — ESMOLOL HCL 10 MG/ML IV SOLN
INTRAVENOUS | Status: DC | PRN
  Administered 2014-12-17 (×2): 20 mg via INTRAVENOUS

## 2014-12-17 MED ORDER — METOCLOPRAMIDE HCL 5 MG/ML IJ SOLN
10.0000 mg | Freq: Four times a day (QID) | INTRAMUSCULAR | Status: AC
  Administered 2014-12-17 – 2014-12-18 (×4): 10 mg via INTRAVENOUS
  Filled 2014-12-17 (×4): qty 2

## 2014-12-17 MED ORDER — TRAMADOL HCL 50 MG PO TABS: 50.0000 mg | ORAL_TABLET | Freq: Four times a day (QID) | ORAL | Status: DC | PRN

## 2014-12-17 MED ORDER — MIDAZOLAM HCL 2 MG/2ML IJ SOLN
INTRAMUSCULAR | Status: AC
  Filled 2014-12-17: qty 2

## 2014-12-17 MED ORDER — POTASSIUM CHLORIDE 10 MEQ/50ML IV SOLN: 10.0000 meq | Freq: Every day | INTRAVENOUS | Status: DC | PRN

## 2014-12-17 MED ORDER — SODIUM CHLORIDE 0.9 % IV SOLN
INTRAVENOUS | Status: DC
  Administered 2014-12-17: 13:00:00 via INTRAVENOUS

## 2014-12-17 MED ORDER — SODIUM CHLORIDE 0.9 % IV SOLN
10000.0000 ug | INTRAVENOUS | Status: DC | PRN
  Administered 2014-12-17: 50 ug/min via INTRAVENOUS

## 2014-12-17 MED ORDER — ACETAMINOPHEN 500 MG PO TABS
1000.0000 mg | ORAL_TABLET | Freq: Four times a day (QID) | ORAL | Status: DC
  Administered 2014-12-18 – 2014-12-20 (×6): 1000 mg via ORAL
  Filled 2014-12-17 (×12): qty 2

## 2014-12-17 MED ORDER — SODIUM CHLORIDE 0.45 % IV SOLN
INTRAVENOUS | Status: DC
  Administered 2014-12-17: 75 mL/h via INTRAVENOUS
  Administered 2014-12-18: 09:00:00 via INTRAVENOUS

## 2014-12-17 MED ORDER — ACETAMINOPHEN 325 MG PO TABS: 325.0000 mg | ORAL_TABLET | ORAL | Status: DC | PRN

## 2014-12-17 MED ORDER — ONDANSETRON HCL 4 MG/2ML IJ SOLN: 4.0000 mg | Freq: Four times a day (QID) | INTRAMUSCULAR | Status: DC | PRN

## 2014-12-17 MED ORDER — OXYCODONE HCL 5 MG PO TABS
5.0000 mg | ORAL_TABLET | ORAL | Status: DC | PRN
  Administered 2014-12-17: 5 mg via ORAL
  Administered 2014-12-17: 10 mg via ORAL
  Administered 2014-12-18 – 2014-12-19 (×4): 5 mg via ORAL
  Filled 2014-12-17 (×4): qty 1
  Filled 2014-12-17: qty 2
  Filled 2014-12-17 (×2): qty 1

## 2014-12-17 MED ORDER — NEOSTIGMINE METHYLSULFATE 10 MG/10ML IV SOLN
INTRAVENOUS | Status: DC | PRN
  Administered 2014-12-17: 4.5 mg via INTRAVENOUS

## 2014-12-17 MED ORDER — DEXTROSE 5 % IV SOLN
1.5000 g | Freq: Two times a day (BID) | INTRAVENOUS | Status: AC
  Administered 2014-12-17 – 2014-12-18 (×2): 1.5 g via INTRAVENOUS
  Filled 2014-12-17 (×2): qty 1.5

## 2014-12-17 MED ORDER — LACTATED RINGERS IV SOLN
INTRAVENOUS | Status: DC | PRN
  Administered 2014-12-17: 13:00:00 via INTRAVENOUS

## 2014-12-17 MED ORDER — OXYCODONE HCL 5 MG PO TABS
5.0000 mg | ORAL_TABLET | Freq: Once | ORAL | Status: AC | PRN
  Administered 2014-12-17: 5 mg via ORAL

## 2014-12-17 MED ORDER — ONDANSETRON HCL 4 MG/2ML IJ SOLN
INTRAMUSCULAR | Status: DC | PRN
  Administered 2014-12-17: 4 mg via INTRAVENOUS

## 2014-12-17 MED ORDER — OXYCODONE HCL 5 MG/5ML PO SOLN: 5.0000 mg | Freq: Once | ORAL | Status: AC | PRN

## 2014-12-17 MED ORDER — BISACODYL 5 MG PO TBEC
10.0000 mg | DELAYED_RELEASE_TABLET | Freq: Every day | ORAL | Status: DC
Start: 2014-12-17 — End: 2014-12-20
  Administered 2014-12-17 – 2014-12-19 (×3): 10 mg via ORAL
  Filled 2014-12-17 (×4): qty 2

## 2014-12-17 MED ORDER — MIDAZOLAM HCL 2 MG/2ML IJ SOLN
INTRAMUSCULAR | Status: AC
Start: 2014-12-17 — End: 2014-12-17
  Filled 2014-12-17: qty 2

## 2014-12-17 MED ORDER — DIPHENHYDRAMINE-ZINC ACETATE 2-0.1 % EX CREA
TOPICAL_CREAM | Freq: Three times a day (TID) | CUTANEOUS | Status: DC | PRN
  Filled 2014-12-17: qty 28

## 2014-12-17 MED ORDER — FENTANYL CITRATE (PF) 100 MCG/2ML IJ SOLN
INTRAMUSCULAR | Status: DC | PRN
  Administered 2014-12-17: 25 ug via INTRAVENOUS
  Administered 2014-12-17: 50 ug via INTRAVENOUS
  Administered 2014-12-17 (×2): 25 ug via INTRAVENOUS
  Administered 2014-12-17: 100 ug via INTRAVENOUS
  Administered 2014-12-17: 25 ug via INTRAVENOUS

## 2014-12-17 MED ORDER — SODIUM CHLORIDE 0.9 % IV SOLN
INTRAVENOUS | Status: DC | PRN
  Administered 2014-12-17: 13:00:00 via INTRAVENOUS

## 2014-12-17 MED ORDER — FENTANYL CITRATE (PF) 100 MCG/2ML IJ SOLN
25.0000 ug | INTRAMUSCULAR | Status: DC | PRN
Start: 2014-12-17 — End: 2014-12-17
  Administered 2014-12-17 (×3): 25 ug via INTRAVENOUS

## 2014-12-17 MED ORDER — LIDOCAINE HCL (CARDIAC) 20 MG/ML IV SOLN
INTRAVENOUS | Status: DC | PRN
  Administered 2014-12-17: 60 mg via INTRAVENOUS

## 2014-12-17 MED ORDER — SENNOSIDES-DOCUSATE SODIUM 8.6-50 MG PO TABS
1.0000 | ORAL_TABLET | Freq: Every day | ORAL | Status: DC
  Administered 2014-12-17 – 2014-12-19 (×3): 1 via ORAL
  Filled 2014-12-17 (×5): qty 1

## 2014-12-17 MED ORDER — ARTIFICIAL TEARS OP OINT
TOPICAL_OINTMENT | OPHTHALMIC | Status: DC | PRN
  Administered 2014-12-17: 1 via OPHTHALMIC

## 2014-12-17 MED ORDER — DIPHENHYDRAMINE HCL 25 MG PO CAPS
25.0000 mg | ORAL_CAPSULE | Freq: Four times a day (QID) | ORAL | Status: DC | PRN
  Administered 2014-12-17 – 2014-12-19 (×4): 25 mg via ORAL
  Filled 2014-12-17 (×4): qty 1

## 2014-12-17 MED ORDER — NEOSTIGMINE METHYLSULFATE 10 MG/10ML IV SOLN
INTRAVENOUS | Status: AC
  Filled 2014-12-17: qty 1

## 2014-12-17 MED ORDER — ACETAMINOPHEN 160 MG/5ML PO SOLN
325.0000 mg | ORAL | Status: DC | PRN
  Filled 2014-12-17: qty 20.3

## 2014-12-17 MED ORDER — ESMOLOL HCL 10 MG/ML IV SOLN
INTRAVENOUS | Status: AC
  Filled 2014-12-17: qty 10

## 2014-12-17 MED ORDER — DIPHENHYDRAMINE HCL 50 MG/ML IJ SOLN: 12.5000 mg | Freq: Four times a day (QID) | INTRAMUSCULAR | Status: DC | PRN

## 2014-12-17 SURGICAL SUPPLY — 89 items
ADH SKN CLS APL DERMABOND .7 (GAUZE/BANDAGES/DRESSINGS)
ADH SKN CLS LQ APL DERMABOND (GAUZE/BANDAGES/DRESSINGS) ×2
APL SKNCLS STERI-STRIP NONHPOA (GAUZE/BANDAGES/DRESSINGS)
APL SRG 22X2 LUM MLBL SLNT (VASCULAR PRODUCTS)
APL SRG 7X2 LUM MLBL SLNT (VASCULAR PRODUCTS)
APPLICATOR TIP COSEAL (VASCULAR PRODUCTS) IMPLANT
APPLICATOR TIP EXT COSEAL (VASCULAR PRODUCTS) IMPLANT
BENZOIN TINCTURE PRP APPL 2/3 (GAUZE/BANDAGES/DRESSINGS) IMPLANT
CANISTER SUCTION 2500CC (MISCELLANEOUS) ×8 IMPLANT
CATH KIT ON Q 5IN SLV (PAIN MANAGEMENT) IMPLANT
CATH THORACIC 28FR (CATHETERS) ×2 IMPLANT
CATH THORACIC 36FR (CATHETERS) IMPLANT
CATH THORACIC 36FR RT ANG (CATHETERS) IMPLANT
CLEANER TIP ELECTROSURG 2X2 (MISCELLANEOUS) ×4 IMPLANT
CLIP TI MEDIUM 6 (CLIP) ×4 IMPLANT
CONN ST 1/4X3/8  BEN (MISCELLANEOUS)
CONN ST 1/4X3/8 BEN (MISCELLANEOUS) IMPLANT
CONN Y 3/8X3/8X3/8  BEN (MISCELLANEOUS)
CONN Y 3/8X3/8X3/8 BEN (MISCELLANEOUS) IMPLANT
CONT SPEC 4OZ CLIKSEAL STRL BL (MISCELLANEOUS) ×8 IMPLANT
COVER SURGICAL LIGHT HANDLE (MISCELLANEOUS) ×6 IMPLANT
DERMABOND ADHESIVE PROPEN (GAUZE/BANDAGES/DRESSINGS) ×2
DERMABOND ADVANCED (GAUZE/BANDAGES/DRESSINGS)
DERMABOND ADVANCED .7 DNX12 (GAUZE/BANDAGES/DRESSINGS) IMPLANT
DERMABOND ADVANCED .7 DNX6 (GAUZE/BANDAGES/DRESSINGS) IMPLANT
DRAIN CHANNEL 28F RND 3/8 FF (WOUND CARE) IMPLANT
DRAIN CHANNEL 32F RND 10.7 FF (WOUND CARE) IMPLANT
DRAPE LAPAROSCOPIC ABDOMINAL (DRAPES) ×4 IMPLANT
DRAPE WARM FLUID 44X44 (DRAPE) ×2 IMPLANT
DRILL BIT 7/64X5 (BIT) ×4 IMPLANT
ELECT BLADE 4.0 EZ CLEAN MEGAD (MISCELLANEOUS) ×4
ELECT REM PT RETURN 9FT ADLT (ELECTROSURGICAL) ×4
ELECTRODE BLDE 4.0 EZ CLN MEGD (MISCELLANEOUS) IMPLANT
ELECTRODE REM PT RTRN 9FT ADLT (ELECTROSURGICAL) ×2 IMPLANT
GAUZE SPONGE 4X4 12PLY STRL (GAUZE/BANDAGES/DRESSINGS) ×4 IMPLANT
GLOVE BIO SURGEON STRL SZ 6 (GLOVE) ×2 IMPLANT
GLOVE BIO SURGEON STRL SZ 6.5 (GLOVE) ×4 IMPLANT
GLOVE BIO SURGEONS STRL SZ 6.5 (GLOVE)
GLOVE BIOGEL PI IND STRL 6.5 (GLOVE) IMPLANT
GLOVE BIOGEL PI INDICATOR 6.5 (GLOVE) ×2
GLOVE EUDERMIC 7 POWDERFREE (GLOVE) ×8 IMPLANT
GLOVE SURG SS PI 7.0 STRL IVOR (GLOVE) ×6 IMPLANT
GOWN STRL REUS W/ TWL LRG LVL3 (GOWN DISPOSABLE) ×8 IMPLANT
GOWN STRL REUS W/ TWL XL LVL3 (GOWN DISPOSABLE) ×2 IMPLANT
GOWN STRL REUS W/TWL LRG LVL3 (GOWN DISPOSABLE) ×16
GOWN STRL REUS W/TWL XL LVL3 (GOWN DISPOSABLE) ×4
KIT BASIN OR (CUSTOM PROCEDURE TRAY) ×4 IMPLANT
KIT ROOM TURNOVER OR (KITS) ×4 IMPLANT
KIT SUCTION CATH 14FR (SUCTIONS) ×4 IMPLANT
NS IRRIG 1000ML POUR BTL (IV SOLUTION) ×14 IMPLANT
PACK CHEST (CUSTOM PROCEDURE TRAY) ×4 IMPLANT
PAD ARMBOARD 7.5X6 YLW CONV (MISCELLANEOUS) ×8 IMPLANT
SEALANT SURG COSEAL 4ML (VASCULAR PRODUCTS) IMPLANT
SEALANT SURG COSEAL 8ML (VASCULAR PRODUCTS) IMPLANT
SOLUTION ANTI FOG 6CC (MISCELLANEOUS) ×4 IMPLANT
SPONGE GAUZE 4X4 12PLY STER LF (GAUZE/BANDAGES/DRESSINGS) ×2 IMPLANT
SUT PROLENE 3 0 SH DA (SUTURE) IMPLANT
SUT PROLENE 4 0 RB 1 (SUTURE)
SUT PROLENE 4-0 RB1 .5 CRCL 36 (SUTURE) IMPLANT
SUT SILK  1 MH (SUTURE) ×4
SUT SILK 1 MH (SUTURE) ×4 IMPLANT
SUT SILK 1 TIES 10X30 (SUTURE) ×2 IMPLANT
SUT SILK 2 0 SH (SUTURE) ×8 IMPLANT
SUT SILK 2 0SH CR/8 30 (SUTURE) IMPLANT
SUT SILK 3 0SH CR/8 30 (SUTURE) IMPLANT
SUT VIC AB 1 CTX 18 (SUTURE) IMPLANT
SUT VIC AB 1 CTX 36 (SUTURE)
SUT VIC AB 1 CTX36XBRD ANBCTR (SUTURE) ×2 IMPLANT
SUT VIC AB 2-0 CT1 27 (SUTURE)
SUT VIC AB 2-0 CT1 TAPERPNT 27 (SUTURE) IMPLANT
SUT VIC AB 2-0 CTX 36 (SUTURE) ×2 IMPLANT
SUT VIC AB 2-0 UR6 27 (SUTURE) ×4 IMPLANT
SUT VIC AB 3-0 MH 27 (SUTURE) IMPLANT
SUT VIC AB 3-0 SH 27 (SUTURE)
SUT VIC AB 3-0 SH 27X BRD (SUTURE) IMPLANT
SUT VIC AB 3-0 X1 27 (SUTURE) ×4 IMPLANT
SUT VICRYL 2 TP 1 (SUTURE) ×2 IMPLANT
SWAB COLLECTION DEVICE MRSA (MISCELLANEOUS) IMPLANT
SYSTEM SAHARA CHEST DRAIN ATS (WOUND CARE) ×4 IMPLANT
TAPE CLOTH 4X10 WHT NS (GAUZE/BANDAGES/DRESSINGS) ×4 IMPLANT
TAPE CLOTH SURG 4X10 WHT LF (GAUZE/BANDAGES/DRESSINGS) ×2 IMPLANT
TIP APPLICATOR SPRAY EXTEND 16 (VASCULAR PRODUCTS) IMPLANT
TOWEL OR 17X24 6PK STRL BLUE (TOWEL DISPOSABLE) ×2 IMPLANT
TOWEL OR 17X26 10 PK STRL BLUE (TOWEL DISPOSABLE) ×4 IMPLANT
TRAP SPECIMEN MUCOUS 40CC (MISCELLANEOUS) ×4 IMPLANT
TRAY FOLEY CATH 14FRSI W/METER (CATHETERS) ×4 IMPLANT
TUBE ANAEROBIC SPECIMEN COL (MISCELLANEOUS) IMPLANT
TUNNELER SHEATH ON-Q 11GX8 DSP (PAIN MANAGEMENT) IMPLANT
WATER STERILE IRR 1000ML POUR (IV SOLUTION) ×8 IMPLANT

## 2014-12-17 NOTE — Progress Notes (Signed)
  Date: 12/17/2014  Patient name: Barry Lawrence  Medical record number: [id number]  Date of birth: 1945-12-29   This patient has been seen and the plan of care was discussed with the house staff. Please see their note for complete details. I concur with their findings with the following additions/corrections: LE edema still present but improved.For VATS.  Bartholomew Crews, MD 12/17/2014, 11:55 AM

## 2014-12-17 NOTE — Anesthesia Procedure Notes (Signed)
Procedure Name: Intubation Performed by: Travis Purk L Pre-anesthesia Checklist: Patient identified, Emergency Drugs available, Suction available, Patient being monitored and Timeout performed Patient Re-evaluated:Patient Re-evaluated prior to inductionOxygen Delivery Method: Circle system utilized Preoxygenation: Pre-oxygenation with 100% oxygen Intubation Type: IV induction Ventilation: Mask ventilation without difficulty Laryngoscope Size: Mac and 3 Grade View: Grade I Tube type: Oral Endobronchial tube: Left, Double lumen EBT, EBT position confirmed by auscultation and EBT position confirmed by fiberoptic bronchoscope and 39 Fr Number of attempts: 1 Airway Equipment and Method: Rigid stylet and Fiberoptic brochoscope Placement Confirmation: ETT inserted through vocal cords under direct vision,  breath sounds checked- equal and bilateral,  positive ETCO2 and CO2 detector Secured at: 29 cm Tube secured with: Tape Dental Injury: Teeth and Oropharynx as per pre-operative assessment

## 2014-12-17 NOTE — OR Nursing (Signed)
12/17/2014- 2000 mls bloody pleural fluid removed from right chest.

## 2014-12-17 NOTE — Transfer of Care (Signed)
Immediate Anesthesia Transfer of Care Note  Patient: Barry Lawrence  Procedure(s) Performed: Procedure(s): RIGHT VIDEO ASSISTED THORACOSCOPY (VATS) (Right) EMPYEMA DRAINAGE (Right)  Patient Location: PACU  Anesthesia Type:General  Level of Consciousness: awake and patient cooperative  Airway & Oxygen Therapy: Patient Spontanous Breathing and Patient connected to face mask oxygen  Post-op Assessment: Report given to RN and Post -op Vital signs reviewed and stable  Post vital signs: Reviewed and stable  Last Vitals:  Filed Vitals:   12/17/14 0910  BP: 157/81  Pulse: 52  Temp: 36.8 C  Resp: 18    Complications: No apparent anesthesia complications

## 2014-12-17 NOTE — Brief Op Note (Signed)
12/12/2014 - 12/17/2014  2:33 PM  PATIENT:  Barry Lawrence  69 y.o. male  PRE-OPERATIVE DIAGNOSIS:   RIGHT PLEURAL EFFUSION  POST-OPERATIVE DIAGNOSIS:  RIGHT PLEURAL EFFUSION  PROCEDURE:  Procedure(s):  RIGHT VIDEO ASSISTED THORACOSCOPY (VATS) (Right) -Drainage of Pleural Effusion  -Evacuation of 1750 ml of blood tinged fluid  SURGEON:  Surgeon(s) and Role:    * Gaye Pollack, MD - Primary  PHYSICIAN ASSISTANT:Mathhew Buysse PA-C   ANESTHESIA:   general  EBL:  Total I/O In: [id number] [P.O.:270; I.V.:500] Out: 2000 [Urine:300; Other:1700]  BLOOD ADMINISTERED:none  DRAINS: 28 Straight Chest Tubwe   LOCAL MEDICATIONS USED:  NONE  SPECIMEN:  Source of Specimen:  Right Pleural Fluid  DISPOSITION OF SPECIMEN:  Micrbiology  COUNTS:  YES  TOURNIQUET:  * No tourniquets in log *  DICTATION: .Dragon Dictation  PLAN OF CARE: Admit to inpatient   PATIENT DISPOSITION:  PACU - hemodynamically stable.   Delay start of Pharmacological VTE agent (>24hrs) due to surgical blood loss or risk of bleeding: yes

## 2014-12-17 NOTE — Anesthesia Preprocedure Evaluation (Addendum)
Anesthesia Evaluation  Patient identified by MRN, date of birth, ID band Patient awake    Reviewed: Allergy & Precautions, NPO status , Patient's Chart, lab work & pertinent test results, reviewed documented beta blocker date and time   History of Anesthesia Complications Negative for: history of anesthetic complications  Airway Mallampati: II  TM Distance: >3 FB Neck ROM: Full    Dental  (+) Teeth Intact   Pulmonary shortness of breath and at rest, sleep apnea , COPDCurrent Smoker,  Right pleural effusion  Absent breath sounds right, clear left  + decreased breath sounds      Cardiovascular hypertension, Pt. on medications and Pt. on home beta blockers + Past MI, + CABG, + Peripheral Vascular Disease and +CHF Rhythm:Regular     Neuro/Psych  Headaches, negative psych ROS   GI/Hepatic Neg liver ROS, GERD-  Medicated and Controlled,  Endo/Other  diabetes  Renal/GU CRFRenal disease     Musculoskeletal  (+) Arthritis -,   Abdominal   Peds  Hematology negative hematology ROS (+)   Anesthesia Other Findings   Reproductive/Obstetrics                            Anesthesia Physical Anesthesia Plan  ASA: III  Anesthesia Plan: General   Post-op Pain Management:    Induction: Intravenous  Airway Management Planned: Double Lumen EBT  Additional Equipment: Arterial line  Intra-op Plan:   Post-operative Plan: Extubation in OR and Possible Post-op intubation/ventilation  Informed Consent: I have reviewed the patients History and Physical, chart, labs and discussed the procedure including the risks, benefits and alternatives for the proposed anesthesia with the patient or authorized representative who has indicated his/her understanding and acceptance.   Dental advisory given  Plan Discussed with: CRNA and Surgeon  Anesthesia Plan Comments:         Anesthesia Quick Evaluation

## 2014-12-17 NOTE — Progress Notes (Signed)
ANTICOAGULATION CONSULT NOTE - Follow Up Consult  Pharmacy Consult for Coumadin Indication: atrial fibrillation  Allergies  Allergen Reactions  . Niaspan [Niacin Er] Hives    Patient Measurements: Height: 5\' 7"  (170.2 cm) Weight: 179 lb 11.2 oz (81.511 kg) IBW/kg (Calculated) : 66.1 Heparin Dosing Weight:   Vital Signs: Temp: 98.2 F (36.8 C) (07/26 0910) Temp Source: Axillary (07/26 0910) BP: 157/81 mmHg (07/26 0910) Pulse Rate: 52 (07/26 0910)  Labs:  Recent Labs  12/15/14 [id number] 12/16/14 0558 12/17/14 0535  HGB  --  13.4  --   HCT  --  42.1  --   PLT  --  237  --   LABPROT 20.1* 18.1* 16.7*  INR 1.72* 1.49 1.34  CREATININE 2.54* 2.22*  --     Estimated Creatinine Clearance: 32.6 mL/min (by C-G formula based on Cr of 2.22).   Medications:  Scheduled:  . amLODipine  10 mg Oral QAC breakfast  . antiseptic oral rinse  7 mL Mouth Rinse BID  . atorvastatin  80 mg Oral QHS  . bisoprolol  5 mg Oral Daily  . cefTRIAXone (ROCEPHIN)  IV  2 g Intravenous Q24H  . furosemide  40 mg Intravenous Daily  . pantoprazole  40 mg Oral Daily  . potassium chloride SA  20 mEq Oral Daily  . sodium chloride  3 mL Intravenous Q12H    Assessment: 69yo on warfarin pta for afib. Pt with large pleural effusion, continue to hold warfarin for VATS procedure. CBC wnl stable.   INR down to 1.34 this AM. Pt is also on Ceftriaxone for possible empyema, previous BC positive for klebsiella per note.  No coumadin in the last 2 days.  Goal of Therapy:  INR 2-3 Monitor platelets by anticoagulation protocol: Yes   Plan:  Hold coumadin for VATS - on schedule for 7/26 @1330   Possible coumadin restart tomorrow (7/27)  Daily INR   Davionne Mastrangelo C. Lennox Grumbles, PharmD Pharmacy Resident  Pager: [phone] 12/17/2014 10:50 AM

## 2014-12-17 NOTE — Progress Notes (Signed)
Subjective: Reports no change in symptoms. Dyspnea continues to be an issue and kept him from sleeping overnight. Tramadol did help the back pain overnight but did not improve his sleep. Did have some wheezing yesterday that improved with breathing treatment.   Objective: Vital signs in last 24 hours: Filed Vitals:   12/16/14 2100 12/17/14 0218 12/17/14 0508 12/17/14 0910  BP:   149/68 157/81  Pulse:  63 58 52  Temp:   98 F (36.7 C) 98.2 F (36.8 C)  TempSrc:   Oral Axillary  Resp:  18 18 18   Height:      Weight: 81.511 kg (179 lb 11.2 oz)     SpO2:  96% 97% 98%   Weight change: -0.771 kg (-1 lb 11.2 oz)  Intake/Output Summary (Last 24 hours) at 12/17/14 1142 Last data filed at 12/17/14 0911  Gross per 24 hour  Intake    920 ml  Output   1650 ml  Net   -730 ml   Physical Exam GENERAL- alert, co-operative, appears as stated age, not in any distress. CARDIAC- Irregularly irregular rhythm, no murmurs, rubs or gallops. RESP- Decreased breath sounds on the right, no crackles or wheezing ABDOMEN- Soft, nontender, no guarding or rebound, no palpable masses or organomegaly, bowel sounds present. EXTREMITIES- pulse 2+, symmetric, 2+ pitting edema to knees bilaterally SKIN- Warm, dry, No rash or lesion. PSYCH- Normal mood and affect, appropriate thought content and speech  Lab Results: Basic Metabolic Panel:  Recent Labs Lab 12/16/14 0558 12/17/14 1000  NA 134* 136  K 4.5 4.7  CL 99* 99*  CO2 28 29  GLUCOSE 110* 96  BUN 42* 34*  CREATININE 2.22* 1.96*  CALCIUM 9.0 9.3   Liver Function Tests:  Recent Labs Lab 12/12/14 1710 12/13/14 1329 12/13/14 1650  AST 18  --   --   ALT 16*  --   --   ALKPHOS 101  --   --   BILITOT 0.6  --   --   PROT 7.7 7.6 6.8  ALBUMIN 3.4* 3.4*  --    No results for input(s): LIPASE, AMYLASE in the last 168 hours. No results for input(s): AMMONIA in the last 168 hours. CBC:  Recent Labs Lab 12/12/14 1710  12/16/14 0558  12/17/14 1000  WBC 10.9*  < > 4.6 5.1  NEUTROABS 8.5*  --   --   --   HGB 15.0  < > 13.4 13.3  HCT 45.3  < > 42.1 41.4  MCV 88.5  < > 88.8 89.0  PLT 329  < > 237 240  < > = values in this interval not displayed.  Fasting Lipid Panel:  Recent Labs Lab 12/13/14 1650  CHOL 143   Coagulation:  Recent Labs Lab 12/14/14 0331 12/15/14 0638 12/16/14 0558 12/17/14 0535  LABPROT 24.8* 20.1* 18.1* 16.7*  INR 2.27* 1.72* 1.49 1.34   Micro Results: Recent Results (from the past 240 hour(s))  AFB culture with smear     Status: None (Preliminary result)   Collection Time: 12/13/14  4:12 PM  Result Value Ref Range Status   Specimen Description PLEURAL FLUID  Final   Special Requests NONE  Final   Acid Fast Smear   Final    NO ACID FAST BACILLI SEEN Performed at [facility]    Culture   Final    CULTURE WILL BE EXAMINED FOR 6 WEEKS BEFORE ISSUING A FINAL REPORT Performed at [facility]    Report  Status PENDING  Incomplete  Body fluid culture     Status: None   Collection Time: 12/13/14  4:12 PM  Result Value Ref Range Status   Specimen Description PLEURAL FLUID  Final   Special Requests NONE  Final   Gram Stain   Final    RARE WBC PRESENT,BOTH PMN AND MONONUCLEAR NO ORGANISMS SEEN CONFIRMED BY M.VESTAL    Culture NO GROWTH 3 DAYS  Final   Report Status 12/16/2014 FINAL  Final  Fungal stain     Status: None   Collection Time: 12/13/14  4:12 PM  Result Value Ref Range Status   Specimen Description PLEURAL FLUID  Final   Special Requests NONE  Final   Fungal Smear   Final    NO YEAST OR FUNGAL ELEMENTS SEEN Performed at [facility]    Report Status 12/15/2014 FINAL  Final   Studies/Results: No results found. Medications: I have reviewed the patient's current medications. Scheduled Meds: . amLODipine  10 mg Oral QAC breakfast  . antiseptic oral rinse  7 mL Mouth Rinse BID  . atorvastatin  80 mg Oral QHS  . bisoprolol  5 mg Oral Daily  .  cefTRIAXone (ROCEPHIN)  IV  2 g Intravenous Q24H  . furosemide  40 mg Intravenous Daily  . pantoprazole  40 mg Oral Daily  . potassium chloride SA  20 mEq Oral Daily  . sodium chloride  3 mL Intravenous Q12H   Continuous Infusions:  PRN Meds:.acetaminophen **OR** acetaminophen, ipratropium-albuterol, traMADol Assessment/Plan: Principal Problem:   Parapneumonic effusion Active Problems:   CAD (coronary artery disease)   Carotid artery disease   Hypertensive heart disease   GERD (gastroesophageal reflux disease)   Abdominal aortic aneurysm without rupture   COPD GOLD II still smoking    Cigarette smoker   CKD (chronic kidney disease), stage IV   PAD (peripheral artery disease)   Sleep apnea   Atrial fibrillation   Chronic diastolic CHF (congestive heart failure)   Pleural effusion  Pleural effusion: Had thoracocenthesis 12/14/2014. Symptomatically not improving. Cardiothoracic surgery consulted, plan for VATS today.  - Continue IV rocephin for now, considering prior blood cultures of klebsiella. - Pleural fluid culutres: AFB negative, Fungal negative, cultures no growth at three days - D/c warfarin pending VATS/Thoractomy - Appreciate cardiothoracic surgery consult - Hgb stable at 13.3 today  Chronic diastolic CHF (congestive heart failure): Diastolic heart failure, Grade 2.  - Continue IV Lasix - Cr improving despite lasix - Strict I&O - Daily weights - Continue Amlodipine 10mg   - Cont Bisoprolol 5mg  daily  AKI on CKD (chronic kidney disease), stage IV:  - SCr at 1.96, improved from 2.22 yesterday. Will continue to monitor. - IV Lasix  Carotid artery disease- s/p CABG - No active chest pain, initial troponin negative, EKG without any signs of new ischemia. - Continue home medications, do not suspect ACS. - Atorva 80mg  - Bisoprolol 5mg  daily - Amlodipine 10mg   Atrial fibrillation: INR 1.34. Rate controlled on Bisoprolol at home,  - CHADSVasc at least 4 -  Holding coumadin until after VATS/thoractomy - Continue bisoprolol  COPD GOLD II still smoking. PT eval as pt will require home O2 with activities.  - Will need home O2 on discharge - Continue Spirvia - Goal O2 sat 89-94% - Albuterol PRN  Right lower back/hip pain: - Pain helped with tramadol last night - tramadol 50 mg qHS  Cigarette smoker - Needs to stop smoking. - RN to provide smoking cessation education  Sleep apnea - Patient says he does not want CPAP while in the hospital  FEN/GI: - HH - Protonix  Dispo: Disposition is deferred at this time, awaiting improvement of current medical problems.  Anticipated discharge in approximately 1-3 day(s).   The patient does have a current PCP Christain Sacramento, MD) and does need an [facility] hospital follow-up appointment after discharge.  The patient does not have transportation limitations that hinder transportation to clinic appointments.   LOS: 5 days   Maryellen Pile, MD 12/17/2014, 11:42 AM

## 2014-12-17 NOTE — Op Note (Signed)
CARDIOTHORACIC SURGERY OPERATIVE NOTE:  Barry Lawrence [place]:[id number] 12/17/2014   Preoperative Dx:  Large right pleural effusion  Postoperative Dx: Same   Procedure: Right video-assisted thoracoscopy, drainage of pleural effusion  Surgeon: Dr. Gaye Pollack   Assistant: Ellwood Handler, PA-C  Anesthesia: GET   Clinical History:   The patient is a 69 year old veteran with a history of DM, ongoing smoking, hyperlipidemia, stage III CKD with a creat of 2.5, OSA on CPAP, CAD s/p CABG x 6 by Dr. Ron Agee in 1996 and aortoiliac vascular disease s/p ABF bypass by Dr. [name] in 05/2014. The patient was admitted for RLL Klebsiella pneumonia on 11/29/2014 and discharged on 7/12. He says he started developing shortness of breath shortly after going home and eventually went to Dr. Kathryne Eriksson who recommended a follow up CXR which showed a large right pleural effusion. He was readmitted on 7/21 and had a CT scan that showed a large right pleural effusion that was partially loculated with consolidation and volume loss of the RLL and RML and part of the RUL. He had a right thoracentesis on 7/22 by Dr. Nelda Marseille removing 1500 cc of bloody fluid consistent with exudate. A follow up CXR showed a persistent moderate right pleural effusion. He continues to complain of shortness of breath. He had some chills. He has a large partially loculated right pleural effusion that is most likely parapneumonic after recent partially treated pneumonia and developing into an empyema. He has a moderate residual effusion after thoracentesis which is probably loculated. I think the best treatment to completely resolve this effusion and pneumonia is surgical drainage via VATS or possibly a small thoracotomy. I discussed the operative procedure with him including alternatives, benefits and risks including but not limited to bleeding, infection and recurrence of the effusion and he understands and agrees to proceed.   Operative procedure:   The  patient was seen in the preoperative holding area. The proper patient, proper operative side, proper operation were confirmed after reviewing his history and chest x-ray. The right side of the chest was signed by me. Preoperative intravenous antibiotics were given. He was taken back to the operating room and placed on table in the supine position. After induction of general endotracheal anesthesia using a double-lumen tube, a Foley catheter was placed in the bladder using sterile technique. Lower extremity sequential compression devices were used. The patient was positioned in the left lateral decubitus position with the right side up. The right side of the chest was prepped with Betadine soap and solution and draped in the usual sterile manner. A timeout was taken and the proper patient, proper operation, and proper operative side were confirmed with nursing and anesthesia staff. A 1 cm incision was made in the midaxillary line at about the eighth intercostal space and the anterior axillary line at the 6 th ICS. The right pleural space was entered bluntly with a hemostat and an 8 mm trocar was inserted. The 30 thoracoscope was inserted and the pleural space inspected. There was 2L of old, thin, bloody fluid that was removed. A sample was sent for culture and cytology. A There was some exudate around the base of the lower lobe that was removed but no pleural peel that required decortication. The parietal and visceral pleura appeared grossly normal. The lung completely reexpanded. A 28 F chest tube was placed through the mid-axillary incision. The other small anterior axillary incision was closed in layers. The chest tube was connected to Pleur-evac suction. The  sponge needle and instrument counts were correct according to the scrub nurse. The patient was then turned into the supine position, extubated, and transported to the post anesthesia care unit in satisfactory and stable condition.

## 2014-12-18 ENCOUNTER — Inpatient Hospital Stay (HOSPITAL_COMMUNITY): Payer: Medicare Other

## 2014-12-18 ENCOUNTER — Encounter (HOSPITAL_COMMUNITY): Payer: Self-pay | Admitting: Surgery

## 2014-12-18 DIAGNOSIS — Z960 Presence of urogenital implants: Secondary | ICD-10-CM

## 2014-12-18 DIAGNOSIS — M545 Low back pain: Secondary | ICD-10-CM

## 2014-12-18 DIAGNOSIS — E875 Hyperkalemia: Secondary | ICD-10-CM

## 2014-12-18 LAB — BASIC METABOLIC PANEL
Anion gap: 6 (ref 5–15)
Anion gap: 7 (ref 5–15)
BUN: 38 mg/dL — ABNORMAL HIGH (ref 6–20)
BUN: 40 mg/dL — ABNORMAL HIGH (ref 6–20)
CO2: 30 mmol/L (ref 22–32)
CO2: 29 mmol/L (ref 22–32)
Calcium: 8.4 mg/dL — ABNORMAL LOW (ref 8.9–10.3)
Calcium: 8.7 mg/dL — ABNORMAL LOW (ref 8.9–10.3)
Chloride: 97 mmol/L — ABNORMAL LOW (ref 101–111)
Chloride: 95 mmol/L — ABNORMAL LOW (ref 101–111)
Creatinine, Ser: 2.6 mg/dL — ABNORMAL HIGH (ref 0.61–1.24)
Creatinine, Ser: 2.61 mg/dL — ABNORMAL HIGH (ref 0.61–1.24)
GFR calc Af Amer: 27 mL/min — ABNORMAL LOW (ref >60)
GFR calc Af Amer: 27 mL/min — ABNORMAL LOW (ref >60)
GFR calc non Af Amer: 24 mL/min — ABNORMAL LOW (ref >60)
GFR calc non Af Amer: 24 mL/min — ABNORMAL LOW (ref >60)
Glucose, Bld: 86 mg/dL (ref 65–99)
Glucose, Bld: 111 mg/dL — ABNORMAL HIGH (ref 65–99)
Potassium: 5.8 mmol/L — ABNORMAL HIGH (ref 3.5–5.1)
Potassium: 4.9 mmol/L (ref 3.5–5.1)
Sodium: 133 mmol/L — ABNORMAL LOW (ref 135–145)
Sodium: 131 mmol/L — ABNORMAL LOW (ref 135–145)

## 2014-12-18 LAB — BLOOD GAS, ARTERIAL
Acid-Base Excess: 3.4 mmol/L — ABNORMAL HIGH (ref 0.0–2.0)
Bicarbonate: 29.2 mEq/L — ABNORMAL HIGH (ref 20.0–24.0)
O2 Content: 3 L/min
O2 Saturation: 90.5 %
Patient temperature: 98.6
TCO2: 31.1 mmol/L (ref 0–100)
pCO2 arterial: 60.9 mmHg (ref 35.0–45.0)
pH, Arterial: 7.302 — ABNORMAL LOW (ref 7.350–7.450)
pO2, Arterial: 62.2 mmHg — ABNORMAL LOW (ref 80.0–100.0)

## 2014-12-18 LAB — CBC
HCT: 40 % (ref 39.0–52.0)
Hemoglobin: 12.6 g/dL — ABNORMAL LOW (ref 13.0–17.0)
MCH: 28.5 pg (ref 26.0–34.0)
MCHC: 31.5 g/dL (ref 30.0–36.0)
MCV: 90.5 fL (ref 78.0–100.0)
Platelets: 251 10*3/uL (ref 150–400)
RBC: 4.42 MIL/uL (ref 4.22–5.81)
RDW: 15.4 % (ref 11.5–15.5)
WBC: 7.6 10*3/uL (ref 4.0–10.5)

## 2014-12-18 LAB — GRAM STAIN

## 2014-12-18 LAB — ADENOSIDE DEAMINASE, PLEURAL FL: ADENOSIDE DEAMINASE, PLEURAL FL: 2 U/L (ref 0.0–9.4)

## 2014-12-18 LAB — PROTIME-INR
INR: 1.35 (ref 0.00–1.49)
Prothrombin Time: 16.8 seconds — ABNORMAL HIGH (ref 11.6–15.2)

## 2014-12-18 NOTE — Progress Notes (Signed)
CRITICAL VALUE ALERT  Critical value received: Pco2 60.9  Date of notification: 12/18/14  Time of notification:  0430  Critical value read back:Yes.    Nurse who received alert:  Sharlyne Pacas RN   MD notified (1st page):  Dr Lovena Le  Time of first page:  0435  MD notified (2nd page):  Time of second page:  Responding MD: Dr Lovena Le  Time MD responded:  [id number]

## 2014-12-18 NOTE — Care Management Important Message (Signed)
Important Message  Patient Details  Name: Barry Lawrence MRN: [place]:[id number] Date of Birth: 1945-09-12   Medicare Important Message Given:  Yes-third notification given    Nathen May 12/18/2014, 12:11 [place] Message  Patient Details  Name: Barry Lawrence MRN: [place]:[id number] Date of Birth: 01/22/1946   Medicare Important Message Given:  Yes-third notification given    Nathen May 12/18/2014, 12:11 PM

## 2014-12-18 NOTE — Clinical Social Work Note (Signed)
CSW Consult Acknowledged:   CSW received a consult for a medical release form. CSW obtained the medical release form. CSW met the pt at the bedside to provided the pt with the medical release form. CSW explained that the form must be complete in its entirely and sign/date and returned back to medical records. At this time CSW will sign off.   [place], MSW, [place]

## 2014-12-18 NOTE — Progress Notes (Signed)
Md made aware of ABG. No orders given at this time. Will continue to monitor pt.

## 2014-12-18 NOTE — Progress Notes (Signed)
Subjective: Patient reports feeling well this morning. SOB is improved following VATS yesterday. Only having mild tenderness in the area of surgery.   Objective: Vital signs in last 24 hours: Filed Vitals:   12/18/14 0800 12/18/14 1019 12/18/14 1047 12/18/14 1156  BP:   110/51   Pulse:      Temp: 97.9 F (36.6 C)   98 F (36.7 C)  TempSrc: Oral   Oral  Resp:      Height:      Weight:      SpO2:  96%     Weight change: -0.011 kg (-0.4 oz)  Intake/Output Summary (Last 24 hours) at 12/18/14 1207 Last data filed at 12/18/14 0800  Gross per 24 hour  Intake   1813 ml  Output   2780 ml  Net   -967 ml   Physical Exam GENERAL- alert, co-operative, appears as stated age, not in any distress. HEENT- Atraumatic, normocephalic, PERRL, EOMI, oral mucosa appears moist, good and intact dentition. No carotid bruit, no cervical LN enlargement, thyroid does not appear enlarged, neck supple. CARDIAC- irregularly irregular rhythm, no murmurs, rubs or gallops. RESP- Moving equal volumes of air, and diffuse crackles bilaterally ABDOMEN- Soft, nontender, no guarding or rebound, bowel sounds present. EXTREMITIES- pulse 2+, symmetric, 1+ pedal edema, SCDs in place SKIN- Warm, dry, No rash or lesion, Wounds clean and dry, no erythema or signs of infection. PSYCH- Normal mood and affect, appropriate thought content and speech. CHEST: Chest tube in place  Lab Results: Basic Metabolic Panel:  Recent Labs Lab 12/17/14 1000 12/18/14 0500  NA 136 133*  K 4.7 5.8*  CL 99* 97*  CO2 29 30  GLUCOSE 96 86  BUN 34* 38*  CREATININE 1.96* 2.60*  CALCIUM 9.3 8.4*   Liver Function Tests:  Recent Labs Lab 12/12/14 1710 12/13/14 1329 12/13/14 1650  AST 18  --   --   ALT 16*  --   --   ALKPHOS 101  --   --   BILITOT 0.6  --   --   PROT 7.7 7.6 6.8  ALBUMIN 3.4* 3.4*  --    No results for input(s): LIPASE, AMYLASE in the last 168 hours. No results for input(s): AMMONIA in the last 168  hours. CBC:  Recent Labs Lab 12/12/14 1710  12/17/14 1000 12/18/14 0500  WBC 10.9*  < > 5.1 7.6  NEUTROABS 8.5*  --   --   --   HGB 15.0  < > 13.3 12.6*  HCT 45.3  < > 41.4 40.0  MCV 88.5  < > 89.0 90.5  PLT 329  < > 240 251  < > = values in this interval not displayed.  CBG:  Recent Labs Lab 12/17/14 1557  GLUCAP 88   Fasting Lipid Panel:  Recent Labs Lab 12/13/14 1650  CHOL 143   Coagulation:  Recent Labs Lab 12/15/14 0638 12/16/14 0558 12/17/14 0535 12/18/14 0500  LABPROT 20.1* 18.1* 16.7* 16.8*  INR 1.72* 1.49 1.34 1.35   Micro Results: Recent Results (from the past 240 hour(s))  AFB culture with smear     Status: None (Preliminary result)   Collection Time: 12/13/14  4:12 PM  Result Value Ref Range Status   Specimen Description PLEURAL FLUID  Final   Special Requests NONE  Final   Acid Fast Smear   Final    NO ACID FAST BACILLI SEEN Performed at [facility]    Culture   Final  CULTURE WILL BE EXAMINED FOR 6 WEEKS BEFORE ISSUING A FINAL REPORT Performed at [facility]    Report Status PENDING  Incomplete  Body fluid culture     Status: None   Collection Time: 12/13/14  4:12 PM  Result Value Ref Range Status   Specimen Description PLEURAL FLUID  Final   Special Requests NONE  Final   Gram Stain   Final    RARE WBC PRESENT,BOTH PMN AND MONONUCLEAR NO ORGANISMS SEEN CONFIRMED BY M.VESTAL    Culture NO GROWTH 3 DAYS  Final   Report Status 12/16/2014 FINAL  Final  Fungal stain     Status: None   Collection Time: 12/13/14  4:12 PM  Result Value Ref Range Status   Specimen Description PLEURAL FLUID  Final   Special Requests NONE  Final   Fungal Smear   Final    NO YEAST OR FUNGAL ELEMENTS SEEN Performed at [facility]    Report Status 12/15/2014 FINAL  Final  Fungus Culture with Smear     Status: None (Preliminary result)   Collection Time: 12/17/14  2:12 PM  Result Value Ref Range Status   Specimen  Description FLUID RIGHT PLEURAL  Final   Special Requests NONE  Final   Fungal Smear   Final    NO YEAST OR FUNGAL ELEMENTS SEEN Performed at [facility]    Culture   Final    CULTURE IN PROGRESS FOR FOUR WEEKS Performed at [facility]    Report Status PENDING  Incomplete  Gram stain     Status: None (Preliminary result)   Collection Time: 12/17/14  2:12 PM  Result Value Ref Range Status   Specimen Description FLUID RIGHT PLEURAL  Final   Special Requests NONE  Final   Gram Stain   Final    WBC PRESENT,BOTH PMN AND MONONUCLEAR ABUNDANT NO ORGANISMS SEEN CONFIRMED BY K. BARR    Report Status PENDING  Incomplete  Fungus Culture with Smear     Status: None (Preliminary result)   Collection Time: 12/17/14  2:20 PM  Result Value Ref Range Status   Specimen Description TISSUE RIGHT PLEURAL  Final   Special Requests NONE  Final   Fungal Smear   Final    NO YEAST OR FUNGAL ELEMENTS SEEN Performed at [facility]    Culture   Final    CULTURE IN PROGRESS FOR FOUR WEEKS Performed at [facility]    Report Status PENDING  Incomplete  Tissue culture     Status: None (Preliminary result)   Collection Time: 12/17/14  2:20 PM  Result Value Ref Range Status   Specimen Description TISSUE RIGHT PLEURAL  Final   Special Requests NONE  Final   Gram Stain   Final    MODERATE WBC PRESENT, PREDOMINANTLY MONONUCLEAR NO ORGANISMS SEEN Performed at [facility]    Culture PENDING  Incomplete   Report Status PENDING  Incomplete   Studies/Results: Dg Chest Port 1 View  12/18/2014   CLINICAL DATA:  69 year old male with history of pneumothorax.  EXAM: PORTABLE CHEST - 1 VIEW  COMPARISON:  Chest x-ray 12/17/2014.  FINDINGS: Lung volumes are low. Bibasilar opacities may reflect areas of atelectasis and/or consolidation, likely with superimposed small bilateral pleural effusions. Right-sided chest tube in position with tip directed into the medial aspect  of the upper right hemithorax. No appreciable pneumothorax identified at this time. Vascular crowding, without frank pulmonary edema. Mild cardiomegaly. Upper mediastinal  contours are within normal limits allowing for patient's low lung volumes and slight patient rotation to the right. Atherosclerosis in the thoracic aorta. Status post median sternotomy for CABG.  IMPRESSION: 1. Right-sided chest tube remains in position. No right pneumothorax. 2. Low lung volumes with bibasilar areas of atelectasis and/or consolidation, likely with small bilateral pleural effusions. 3. Cardiomegaly with pulmonary venous congestion, but no frank pulmonary edema. 4. Atherosclerosis.   Electronically Signed   By: Vinnie Langton M.D.   On: 12/18/2014 08:59   Dg Chest Port 1 View  12/17/2014   CLINICAL DATA:  Postoperative right sided video-assisted thoracoscopic surgery with drainage  EXAM: PORTABLE CHEST - 1 VIEW  COMPARISON:  December 14, 2014  FINDINGS: There is a chest tube on the right. There has been significant drainage of fluid from the right lung. A small amount of residual pleural effusion remains with right base atelectasis.  There is mild interstitial edema with cardiomegaly. The pulmonary vascularity is normal. No adenopathy. No bone lesions. Patient is status post coronary artery bypass grafting. There are foci of carotid artery calcification bilaterally.  IMPRESSION: Chest tube on the right, status post drainage of pleural effusion. There is a small residua right effusion with mild right base atelectasis. There is mild edema with cardiomegaly consistent with a mild degree of underlying congestive heart failure. There is currently no appreciable airspace consolidation.   Electronically Signed   By: Lowella Grip III M.D.   On: 12/17/2014 15:48   Medications: I have reviewed the patient's current medications. Scheduled Meds: . acetaminophen  1,000 mg Oral 4 times per day   Or  . acetaminophen (TYLENOL) oral  liquid 160 mg/5 mL  1,000 mg Oral 4 times per day  . amLODipine  10 mg Oral QAC breakfast  . antiseptic oral rinse  7 mL Mouth Rinse BID  . atorvastatin  80 mg Oral QHS  . bisacodyl  10 mg Oral Daily  . bisoprolol  5 mg Oral Daily  . cefTRIAXone (ROCEPHIN)  IV  2 g Intravenous Q24H  . furosemide  40 mg Intravenous Daily  . levalbuterol  0.63 mg Nebulization Q6H  . metoCLOPramide (REGLAN) injection  10 mg Intravenous 4 times per day  . pantoprazole  40 mg Oral Daily  . senna-docusate  1 tablet Oral QHS  . sodium chloride  3 mL Intravenous Q12H   Continuous Infusions: . sodium chloride 75 mL/hr at 12/18/14 0838  . sodium chloride 10 mL/hr at 12/17/14 1250   PRN Meds:.acetaminophen **OR** acetaminophen, diphenhydrAMINE **OR** diphenhydrAMINE, diphenhydrAMINE-zinc acetate, ipratropium-albuterol, ondansetron (ZOFRAN) IV, oxyCODONE, traMADol Assessment/Plan: Principal Problem:   Parapneumonic effusion Active Problems:   CAD (coronary artery disease)   Carotid artery disease   Hypertensive heart disease   GERD (gastroesophageal reflux disease)   Abdominal aortic aneurysm without rupture   COPD GOLD II still smoking    Cigarette smoker   CKD (chronic kidney disease), stage IV   PAD (peripheral artery disease)   Sleep apnea   Atrial fibrillation   Chronic diastolic CHF (congestive heart failure)   Pleural effusion  Pleural effusion: s/p VATS yesterday. Breathing improved today.  - VATS cultures 7/26: No fungus, gram stain no organisms with many PMN and mononuclear cells, AFP pending - Rocephin day 5, will finish 7 day course - Pleural fluid culutres 7/22: AFB negative, Fungal negative, cultures no growth at three days - D/c warfarin s/p VATS, will restart tomorrow if no s/s of bleeding today - Hgb stable at 12.6 from  13.3 yesterday, will trend  Hyperkalemia: K 5.8 today. Will repeat BMP to check for hemolysis. - Repeat BMP - EKG if K remains elevated  Chronic diastolic CHF  (congestive heart failure): Diastolic heart failure, Grade 2.  - Continue IV Lasix - Cr up to 2.6 following surgery yesterday - Strict I&O - Daily weights - Continue Amlodipine 10mg   - Cont Bisoprolol 5mg  daily  AKI on CKD (chronic kidney disease), stage IV:  - SCr at 2.6 following surgery. Will continue to monitor. - Repeat BMP - IV Lasix  Carotid artery disease- s/p CABG - No active chest pain, initial troponin negative, EKG without any signs of new ischemia. - Continue home medications, do not suspect ACS. - Atorva 80mg  - Bisoprolol 5mg  daily - Amlodipine 10mg   Atrial fibrillation: INR 1.34. Rate controlled on Bisoprolol at home,  - CHADSVasc at least 4 - Holding coumadin following VATS, consider restarting tomorrow if no s/s of bleeding today - Continue bisoprolol  COPD GOLD II still smoking. PT eval as pt will require home O2 with activities.  - Will need home O2 on discharge - Continue Spirvia - Goal O2 sat 89-94% - Albuterol PRN  Right lower back/hip pain: - tramadol 50 mg qHS  Cigarette smoker - Needs to stop smoking. - RN to provide smoking cessation education  Sleep apnea - Patient says he does not want CPAP while in the hospital  FEN/GI: - HH - Protonix  Dispo: Disposition is deferred at this time, awaiting improvement of current medical problems.  Anticipated discharge in approximately 1-3 day(s).   The patient does have a current PCP Christain Sacramento, MD) and does need an [facility] hospital follow-up appointment after discharge.  The patient does not have transportation limitations that hinder transportation to clinic appointments.   LOS: 6 days   Maryellen Pile, MD 12/18/2014, 12:07 PM

## 2014-12-18 NOTE — Progress Notes (Signed)
  Date: 12/18/2014  Patient name: Barry Lawrence  Medical record number: [place]:[id number]  Date of birth: [birth year]   This patient has been seen and the plan of care was discussed with the house staff. Please see their note for complete details. I concur with their findings with the following additions/corrections: Pt states breathing better today. Has foley cath and OK to D/C. Read op report and there was no pus but was some exudate around lung base. Otherwise, per dr Shanna Cisco note.  Bartholomew Crews, MD 12/18/2014, 4:02 PM

## 2014-12-18 NOTE — Progress Notes (Addendum)
      [address]       [place] [address]             [phone]       1 Day Post-Op Procedure(s) (LRB): RIGHT VIDEO ASSISTED THORACOSCOPY (VATS) (Right) EMPYEMA DRAINAGE (Right)  Subjective: Patient sitting at edge of bed eating breakfast. He thinks his breathing is better.  Objective: Vital signs in last 24 hours: Temp:  [97.3 F (36.3 C)-98.5 F (36.9 C)] 98.3 F (36.8 C) (07/27 0456) Pulse Rate:  [50-81] 57 (07/27 0000) Cardiac Rhythm:  [-] Atrial fibrillation (07/26 2330) Resp:  [13-23] 13 (07/26 2330) BP: (126-160)/(55-81) 126/55 mmHg (07/26 2330) SpO2:  [89 %-99 %] 94 % (07/27 0229) Arterial Line BP: (144-199)/(35-71) 146/52 mmHg (07/26 2330) Weight:  [177 lb 0.5 oz (80.3 kg)-179 lb 10.8 oz (81.5 kg)] 177 lb 0.5 oz (80.3 kg) (07/27 0500)     Intake/Output from previous day: 07/26 0701 - 07/27 0700 In: 2083 [P.O.:510; I.V.:1523; IV Piggyback:50] Out: [id number] [Urine:1050; Blood:25; Chest Tube:230]   Physical Exam:  Cardiovascular: IRRR IRRR Pulmonary: Crackles bilaterally Abdomen: Soft, non tender, bowel sounds present. Extremities: SCDs in place Wounds: Clean and dry.  No erythema or signs of infection. Chest Tube: to suction and no air leak  Lab Results: CBC: Recent Labs  12/17/14 1000 12/18/14 0500  WBC 5.1 7.6  HGB 13.3 12.6*  HCT 41.4 40.0  PLT 240 251   BMET:  Recent Labs  12/17/14 1000 12/18/14 0500  NA 136 133*  K 4.7 5.8*  CL 99* 97*  CO2 29 30  GLUCOSE 96 86  BUN 34* 38*  CREATININE 1.96* 2.60*  CALCIUM 9.3 8.4*    PT/INR:  Recent Labs  12/18/14 0500  LABPROT 16.8*  INR 1.35   ABG:  INR: Will add last result for INR, ABG once components are confirmed Will add last 4 CBG results once components are confirmed  Assessment/Plan:  1. CV - A fib with CVR in the 70's. On Norvasc 5 mg daily 2.  Pulmonary - On 4 liters of oxygen via [place]. Wean to room air as tolerates.Chest tube with 230 cc since surgery.  Chest tube is to suction. Chest tube to remain for now.There is no air leak. No CXR ordered this am so will order. Encourage incentive spirometer. 3. AKI on CKD-creatinine up to 2.6 this am. Creatinine at time of admission was 2.78. On Lasix 40 mg IV. Per medicine. Will leave foley in for now. 4. Hyperkalemia-potassium up to 5.8. Per medicine 5.ID-On Rocephin  ZIMMERMAN,DONIELLE MPA-C 12/18/2014,8:20 AM  Chart reviewed, patient examined, agree with above. Will put chest tube to water seal He does not have an IS so will order again DC a-line. Can remove foley if ok with medicine. Mobilize.

## 2014-12-18 NOTE — Discharge Instructions (Addendum)
Information on my medicine - Coumadin   (Warfarin)  This medication education was reviewed with me or my healthcare representative as part of my discharge preparation.  The pharmacist that spoke with me during my hospital stay was:  Reginia Naas, [facility]  Why was Coumadin prescribed for you? Coumadin was prescribed for you because you have a blood clot or a medical condition that can cause an increased risk of forming blood clots. Blood clots can cause serious health problems by blocking the flow of blood to the heart, lung, or brain. Coumadin can prevent harmful blood clots from forming. As a reminder your indication for Coumadin is:   Stroke Prevention Because Of Atrial Fibrillation  What test will check on my response to Coumadin? While on Coumadin (warfarin) you will need to have an INR test regularly to ensure that your dose is keeping you in the desired range. The INR (international normalized ratio) number is calculated from the result of the laboratory test called prothrombin time (PT).  If an INR APPOINTMENT HAS NOT ALREADY BEEN MADE FOR YOU please schedule an appointment to have this lab work done by your health care provider within 7 days. Your INR goal is usually a number between:  2 to 3 or your provider may give you a more narrow range like 2-2.5.  Ask your health care provider during an office visit what your goal INR is.  What  do you need to  know  About  COUMADIN? Take Coumadin (warfarin) exactly as prescribed by your healthcare provider about the same time each day.  DO NOT stop taking without talking to the doctor who prescribed the medication.  Stopping without other blood clot prevention medication to take the place of Coumadin may increase your risk of developing a new clot or stroke.  Get refills before you run out.  What do you do if you miss a dose? If you miss a dose, take it as soon as you remember on the same day then continue your regularly scheduled regimen the  next day.  Do not take two doses of Coumadin at the same time.  Important Safety Information A possible side effect of Coumadin (Warfarin) is an increased risk of bleeding. You should call your healthcare provider right away if you experience any of the following: ? Bleeding from an injury or your nose that does not stop. ? Unusual colored urine (red or dark brown) or unusual colored stools (red or black). ? Unusual bruising for unknown reasons. ? A serious fall or if you hit your head (even if there is no bleeding).  Some foods or medicines interact with Coumadin (warfarin) and might alter your response to warfarin. To help avoid this: ? Eat a balanced diet, maintaining a consistent amount of Vitamin K. ? Notify your provider about major diet changes you plan to make. ? Avoid alcohol or limit your intake to 1 drink for women and 2 drinks for men per day. (1 drink is 5 oz. wine, 12 oz. beer, or 1.5 oz. liquor.)  Make sure that ANY health care provider who prescribes medication for you knows that you are taking Coumadin (warfarin).  Also make sure the healthcare provider who is monitoring your Coumadin knows when you have started a new medication including herbals and non-prescription products.  Coumadin (Warfarin)  Major Drug Interactions  Increased Warfarin Effect Decreased Warfarin Effect  Alcohol (large quantities) Antibiotics (esp. Septra/Bactrim, Flagyl, Cipro) Amiodarone (Cordarone) Aspirin (ASA) Cimetidine (Tagamet) Megestrol (Megace) NSAIDs (ibuprofen, naproxen,  etc.) Piroxicam (Feldene) Propafenone (Rythmol SR) Propranolol (Inderal) Isoniazid (INH) Posaconazole (Noxafil) Barbiturates (Phenobarbital) Carbamazepine (Tegretol) Chlordiazepoxide (Librium) Cholestyramine (Questran) Griseofulvin Oral Contraceptives Rifampin Sucralfate (Carafate) Vitamin K   Coumadin (Warfarin) Major Herbal Interactions  Increased Warfarin Effect Decreased Warfarin Effect   Garlic Ginseng Ginkgo biloba Coenzyme Q10 Green tea St. Johns wort    Coumadin (Warfarin) FOOD Interactions  Eat a consistent number of servings per week of foods HIGH in Vitamin K (1 serving =  cup)  Collards (cooked, or boiled & drained) Kale (cooked, or boiled & drained) Mustard greens (cooked, or boiled & drained) Parsley *serving size only =  cup Spinach (cooked, or boiled & drained) Swiss chard (cooked, or boiled & drained) Turnip greens (cooked, or boiled & drained)  Eat a consistent number of servings per week of foods MEDIUM-HIGH in Vitamin K (1 serving = 1 cup)  Asparagus (cooked, or boiled & drained) Broccoli (cooked, boiled & drained, or raw & chopped) Brussel sprouts (cooked, or boiled & drained) *serving size only =  cup Lettuce, raw (green leaf, endive, romaine) Spinach, raw Turnip greens, raw & chopped   These websites have more information on Coumadin (warfarin):  FailFactory.se; VeganReport.com.au;   Thoracoscopy Care After Refer to this sheet in the next few weeks. These discharge instructions provide you with general information on caring for yourself after you leave the hospital. Your caregiver may also give you specific instructions. Your treatment has been planned according to the most current medical practices available, but unavoidable complications sometimes occur. If you have any problems or questions after discharge, call your caregiver. HOME CARE INSTRUCTIONS   Remove the bandage (dressing) over your chest tube site as directed by your caregiver.  It is normal to be sore for a couple weeks following surgery. See your caregiver if this seems to be getting worse rather than better.  Only take over-the-counter or prescription medicines for pain, discomfort, or fever as directed by your caregiver. It is very important to take pain medicine when you need it so that you will cough and breathe deeply enough to clear mucus (phlegm)  and expand your lungs.  If it hurts to cough, hold a pillow against your chest when you cough. This may help with the discomfort. In spite of the discomfort, cough frequently, as this helps protect against getting an infection in your lung (pneumonia).  Taking deep breaths keeps lungs inflated and protects against pneumonia. Most patients will go home with an incentive spirometer that encourages deep breathing.  You may resume a normal diet and activities as directed.  Use showers for bathing until you see your caregiver, or as instructed.  Change dressings if necessary or as directed.  Avoid lifting or driving until you are instructed otherwise.  Make an appointment to see your caregiver for stitch (suture) or staple removal when instructed.  Do not travel by airplane for 2 weeks after the chest tube is removed. SEEK MEDICAL CARE IF:   You are bleeding from your wounds.  You have redness, swelling, or increasing pain in the wounds.  Your heartbeat feels irregular or very fast.  There is pus coming from your wounds.  There is a bad smell coming from the wound or dressing. SEEK IMMEDIATE MEDICAL CARE IF:   You have a fever.  You develop a rash.  You have difficulty breathing.  You develop any reaction or side effects to medicines given.  You develop lightheadedness or feel faint.  You develop shortness of breath or chest  pain. MAKE SURE YOU:   Understand these instructions.  Will watch your condition.  Will get help right away if you are not doing well or get worse. Document Released: 11/27/2004 Document Revised: 08/02/2011 Document Reviewed: 10/28/2010 [facility] Patient Information 2015 [place], [place]. This information is not intended to replace advice given to you by your health care provider. Make sure you discuss any questions you have with your health care provider.

## 2014-12-18 NOTE — Progress Notes (Signed)
UR COMPLETED  

## 2014-12-19 ENCOUNTER — Inpatient Hospital Stay (HOSPITAL_COMMUNITY): Payer: Medicare Other

## 2014-12-19 DIAGNOSIS — K5900 Constipation, unspecified: Secondary | ICD-10-CM

## 2014-12-19 LAB — CBC
HCT: 38.2 % — ABNORMAL LOW (ref 39.0–52.0)
Hemoglobin: 12.2 g/dL — ABNORMAL LOW (ref 13.0–17.0)
MCH: 28.8 pg (ref 26.0–34.0)
MCHC: 31.9 g/dL (ref 30.0–36.0)
MCV: 90.3 fL (ref 78.0–100.0)
Platelets: 215 10*3/uL (ref 150–400)
RBC: 4.23 MIL/uL (ref 4.22–5.81)
RDW: 15.2 % (ref 11.5–15.5)
WBC: 7.9 10*3/uL (ref 4.0–10.5)

## 2014-12-19 LAB — COMPREHENSIVE METABOLIC PANEL
ALT: 16 U/L — ABNORMAL LOW (ref 17–63)
AST: 15 U/L (ref 15–41)
Albumin: 2.6 g/dL — ABNORMAL LOW (ref 3.5–5.0)
Alkaline Phosphatase: 123 U/L (ref 38–126)
Anion gap: 7 (ref 5–15)
BUN: 41 mg/dL — ABNORMAL HIGH (ref 6–20)
CO2: 28 mmol/L (ref 22–32)
Calcium: 8.7 mg/dL — ABNORMAL LOW (ref 8.9–10.3)
Chloride: 96 mmol/L — ABNORMAL LOW (ref 101–111)
Creatinine, Ser: 2.86 mg/dL — ABNORMAL HIGH (ref 0.61–1.24)
GFR calc Af Amer: 24 mL/min — ABNORMAL LOW (ref >60)
GFR calc non Af Amer: 21 mL/min — ABNORMAL LOW (ref >60)
Glucose, Bld: 91 mg/dL (ref 65–99)
Potassium: 5 mmol/L (ref 3.5–5.1)
Sodium: 131 mmol/L — ABNORMAL LOW (ref 135–145)
Total Bilirubin: 0.5 mg/dL (ref 0.3–1.2)
Total Protein: 5.7 g/dL — ABNORMAL LOW (ref 6.5–8.1)

## 2014-12-19 LAB — PROTIME-INR
INR: 1.36 (ref 0.00–1.49)
Prothrombin Time: 16.9 seconds — ABNORMAL HIGH (ref 11.6–15.2)

## 2014-12-19 MED ORDER — POLYETHYLENE GLYCOL 3350 17 G PO PACK
17.0000 g | PACK | Freq: Two times a day (BID) | ORAL | Status: DC
  Administered 2014-12-19 (×2): 17 g via ORAL
  Filled 2014-12-19 (×3): qty 1

## 2014-12-19 MED ORDER — WARFARIN SODIUM 7.5 MG PO TABS
7.5000 mg | ORAL_TABLET | Freq: Once | ORAL | Status: AC
  Administered 2014-12-19: 7.5 mg via ORAL
  Filled 2014-12-19: qty 1

## 2014-12-19 MED ORDER — WARFARIN - PHARMACIST DOSING INPATIENT: Freq: Every day | Status: DC

## 2014-12-19 NOTE — Progress Notes (Signed)
Pt arrived to 527 at 19:05. Pt A&O x 4 accompanied by spouse. 3L [place]. No apparent distress. States no pain.

## 2014-12-19 NOTE — Progress Notes (Addendum)
Subjective: Barry Lawrence reports breathing has improved, pain from surgery minimal. Does report passing blood clots in his urine this morning but has since cleared. Speaking with the nurse it appears he had frank blood when the foley was placed and passed clots several times yesterday. Reports not having a BM in 7 days.   Objective: Vital signs in last 24 hours: Filed Vitals:   12/19/14 0310 12/19/14 0715 12/19/14 0855 12/19/14 1143  BP: 134/66 124/64    Pulse: 63 78 86   Temp: 98.3 F (36.8 C) 98.3 F (36.8 C)  98.4 F (36.9 C)  TempSrc: Oral Oral  Oral  Resp: 12 23 17    Height:      Weight:      SpO2: 92% 94% 93%    Weight change:   Intake/Output Summary (Last 24 hours) at 12/19/14 1407 Last data filed at 12/19/14 1300  Gross per 24 hour  Intake 1808.75 ml  Output   1605 ml  Net 203.75 ml   Physical Exam GENERAL- alert, co-operative, appears as stated age, not in any distress. HEENT- Atraumatic, normocephalic, PERRL, EOMI, oral mucosa appears moist, good and intact dentition. No carotid bruit, no cervical LN enlargement, thyroid does not appear enlarged, neck supple. CARDIAC- irregularly irregular rhythm, no murmurs, rubs or gallops. RESP- Moving equal volumes of air, and diffuse crackles bilaterally ABDOMEN- Soft, nontender, no guarding or rebound, bowel sounds present. EXTREMITIES- pulse 2+, symmetric, 1+ pedal edema, SCDs in place SKIN- Warm, dry, No rash or lesion, Wounds clean and dry, no erythema or signs of infection. PSYCH- Normal mood and affect, appropriate thought content and speech. CHEST: Chest tube in place  Lab Results: Basic Metabolic Panel:  Recent Labs Lab 12/18/14 1300 12/19/14 0232  NA 131* 131*  K 4.9 5.0  CL 95* 96*  CO2 29 28  GLUCOSE 111* 91  BUN 40* 41*  CREATININE 2.61* 2.86*  CALCIUM 8.7* 8.7*   Liver Function Tests:  Recent Labs Lab 12/12/14 1710 12/13/14 1329 12/13/14 1650 12/19/14 0232  AST 18  --   --  15  ALT 16*  --    --  16*  ALKPHOS 101  --   --  123  BILITOT 0.6  --   --  0.5  PROT 7.7 7.6 6.8 5.7*  ALBUMIN 3.4* 3.4*  --  2.6*   CBC:  Recent Labs Lab 12/12/14 1710  12/18/14 0500 12/19/14 0232  WBC 10.9*  < > 7.6 7.9  NEUTROABS 8.5*  --   --   --   HGB 15.0  < > 12.6* 12.2*  HCT 45.3  < > 40.0 38.2*  MCV 88.5  < > 90.5 90.3  PLT 329  < > 251 215  < > = values in this interval not displayed.  CBG:  Recent Labs Lab 12/17/14 1557  GLUCAP 88   Fasting Lipid Panel:  Recent Labs Lab 12/13/14 1650  CHOL 143   Coagulation:  Recent Labs Lab 12/16/14 0558 12/17/14 0535 12/18/14 0500 12/19/14 0232  LABPROT 18.1* 16.7* 16.8* 16.9*  INR 1.49 1.34 1.35 1.36   Micro Results: Recent Results (from the past 240 hour(s))  AFB culture with smear     Status: None (Preliminary result)   Collection Time: 12/13/14  4:12 PM  Result Value Ref Range Status   Specimen Description PLEURAL FLUID  Final   Special Requests NONE  Final   Acid Fast Smear   Final    NO ACID FAST BACILLI SEEN  Performed at [employer] [facility]   Final    CULTURE WILL BE EXAMINED FOR 6 WEEKS BEFORE ISSUING A FINAL REPORT Performed at [facility]    Report Status PENDING  Incomplete  Body fluid culture     Status: None   Collection Time: 12/13/14  4:12 PM  Result Value Ref Range Status   Specimen Description PLEURAL FLUID  Final   Special Requests NONE  Final   Gram Stain   Final    RARE WBC PRESENT,BOTH PMN AND MONONUCLEAR NO ORGANISMS SEEN CONFIRMED BY M.VESTAL    Culture NO GROWTH 3 DAYS  Final   Report Status 12/16/2014 FINAL  Final  Fungal stain     Status: None   Collection Time: 12/13/14  4:12 PM  Result Value Ref Range Status   Specimen Description PLEURAL FLUID  Final   Special Requests NONE  Final   Fungal Smear   Final    NO YEAST OR FUNGAL ELEMENTS SEEN Performed at [facility]    Report Status 12/15/2014 FINAL  Final  Fungus Culture with Smear     Status:  None (Preliminary result)   Collection Time: 12/17/14  2:12 PM  Result Value Ref Range Status   Specimen Description FLUID RIGHT PLEURAL  Final   Special Requests NONE  Final   Fungal Smear   Final    NO YEAST OR FUNGAL ELEMENTS SEEN Performed at [facility]    Culture   Final    CULTURE IN PROGRESS FOR FOUR WEEKS Performed at [facility]    Report Status PENDING  Incomplete  AFB culture with smear     Status: None (Preliminary result)   Collection Time: 12/17/14  2:12 PM  Result Value Ref Range Status   Specimen Description FLUID RIGHT PLEURAL  Final   Special Requests NONE  Final   Acid Fast Smear   Final    NO ACID FAST BACILLI SEEN Performed at [facility]    Culture   Final    CULTURE WILL BE EXAMINED FOR 6 WEEKS BEFORE ISSUING A FINAL REPORT Performed at [facility]    Report Status PENDING  Incomplete  Culture, body fluid-bottle     Status: None (Preliminary result)   Collection Time: 12/17/14  2:12 PM  Result Value Ref Range Status   Specimen Description FLUID RIGHT PLEURAL  Final   Special Requests NONE  Final   Culture NO GROWTH 2 DAYS  Final   Report Status PENDING  Incomplete  Gram stain     Status: None   Collection Time: 12/17/14  2:12 PM  Result Value Ref Range Status   Specimen Description FLUID RIGHT PLEURAL  Final   Special Requests NONE  Final   Gram Stain   Final    ABUNDANT WBC PRESENT,BOTH PMN AND MONONUCLEAR NO ORGANISMS SEEN CONFIRMED BY K. BARR    Report Status 12/18/2014 FINAL  Final  Fungus Culture with Smear     Status: None (Preliminary result)   Collection Time: 12/17/14  2:20 PM  Result Value Ref Range Status   Specimen Description TISSUE RIGHT PLEURAL  Final   Special Requests NONE  Final   Fungal Smear   Final    NO YEAST OR FUNGAL ELEMENTS SEEN Performed at [facility]    Culture   Final    CULTURE IN PROGRESS FOR FOUR WEEKS Performed at [facility]    Report Status  PENDING  Incomplete  Tissue culture     Status: None (Preliminary result)   Collection Time: 12/17/14  2:20 PM  Result Value Ref Range Status   Specimen Description TISSUE RIGHT PLEURAL  Final   Special Requests NONE  Final   Gram Stain   Final    MODERATE WBC PRESENT, PREDOMINANTLY MONONUCLEAR NO ORGANISMS SEEN Performed at [facility]    Culture   Final    NO GROWTH 2 DAYS Performed at [facility]    Report Status PENDING  Incomplete  AFB culture with smear     Status: None (Preliminary result)   Collection Time: 12/17/14  2:20 PM  Result Value Ref Range Status   Specimen Description TISSUE RIGHT PLEURAL  Final   Special Requests NONE  Final   Acid Fast Smear   Final    NO ACID FAST BACILLI SEEN Performed at [facility]    Culture   Final    CULTURE WILL BE EXAMINED FOR 6 WEEKS BEFORE ISSUING A FINAL REPORT Performed at [facility]    Report Status PENDING  Incomplete   Studies/Results: Dg Chest Port 1 View  12/19/2014   CLINICAL DATA:  Follow-up of pleural effusion, status post empyema drainage on December 17 2014  EXAM: PORTABLE CHEST - 1 VIEW  COMPARISON:  Portable chest x-ray of December 18, 2014  FINDINGS: The lungs are adequately inflated. The right hemidiaphragm is slightly better demonstrated today. The interstitial markings remain increased bilaterally with patchy areas of alveolar airspace opacity. The right-sided chest tube is unchanged in position. There is no pneumothorax. The cardiac silhouette remains enlarged. The central pulmonary vascularity is mildly prominent. There are post CABG changes.  IMPRESSION: Allowing for differences in positioning and inflation there has not been significant interval change since yesterday's study. There is no pneumothorax nor significant pleural effusion.   Electronically Signed   By: Judson  Martinique M.D.   On: 12/19/2014 08:13   Dg Chest Port 1 View  12/18/2014   CLINICAL DATA:  69 year old male with  history of pneumothorax.  EXAM: PORTABLE CHEST - 1 VIEW  COMPARISON:  Chest x-ray 12/17/2014.  FINDINGS: Lung volumes are low. Bibasilar opacities may reflect areas of atelectasis and/or consolidation, likely with superimposed small bilateral pleural effusions. Right-sided chest tube in position with tip directed into the medial aspect of the upper right hemithorax. No appreciable pneumothorax identified at this time. Vascular crowding, without frank pulmonary edema. Mild cardiomegaly. Upper mediastinal contours are within normal limits allowing for patient's low lung volumes and slight patient rotation to the right. Atherosclerosis in the thoracic aorta. Status post median sternotomy for CABG.  IMPRESSION: 1. Right-sided chest tube remains in position. No right pneumothorax. 2. Low lung volumes with bibasilar areas of atelectasis and/or consolidation, likely with small bilateral pleural effusions. 3. Cardiomegaly with pulmonary venous congestion, but no frank pulmonary edema. 4. Atherosclerosis.   Electronically Signed   By: Vinnie Langton M.D.   On: 12/18/2014 08:59   Dg Chest Port 1 View  12/17/2014   CLINICAL DATA:  Postoperative right sided video-assisted thoracoscopic surgery with drainage  EXAM: PORTABLE CHEST - 1 VIEW  COMPARISON:  December 14, 2014  FINDINGS: There is a chest tube on the right. There has been significant drainage of fluid from the right lung. A small amount of residual pleural effusion remains with right base atelectasis.  There is mild interstitial edema with cardiomegaly. The pulmonary vascularity is normal. No adenopathy. No bone lesions.  Patient is status post coronary artery bypass grafting. There are foci of carotid artery calcification bilaterally.  IMPRESSION: Chest tube on the right, status post drainage of pleural effusion. There is a small residua right effusion with mild right base atelectasis. There is mild edema with cardiomegaly consistent with a mild degree of underlying  congestive heart failure. There is currently no appreciable airspace consolidation.   Electronically Signed   By: Lowella Grip III M.D.   On: 12/17/2014 15:48   Medications: I have reviewed the patient's current medications. Scheduled Meds: . acetaminophen  1,000 mg Oral 4 times per day   Or  . acetaminophen (TYLENOL) oral liquid 160 mg/5 mL  1,000 mg Oral 4 times per day  . amLODipine  10 mg Oral QAC breakfast  . antiseptic oral rinse  7 mL Mouth Rinse BID  . atorvastatin  80 mg Oral QHS  . bisacodyl  10 mg Oral Daily  . bisoprolol  5 mg Oral Daily  . cefTRIAXone (ROCEPHIN)  IV  2 g Intravenous Q24H  . furosemide  40 mg Intravenous Daily  . levalbuterol  0.63 mg Nebulization Q6H  . pantoprazole  40 mg Oral Daily  . polyethylene glycol  17 g Oral BID  . senna-docusate  1 tablet Oral QHS  . sodium chloride  3 mL Intravenous Q12H  . warfarin  7.5 mg Oral ONCE-1800  . Warfarin - Pharmacist Dosing Inpatient   Does not apply q1800   Continuous Infusions: . sodium chloride Stopped (12/19/14 0400)  . sodium chloride 10 mL/hr at 12/17/14 1250   PRN Meds:.acetaminophen **OR** acetaminophen, diphenhydrAMINE **OR** diphenhydrAMINE, diphenhydrAMINE-zinc acetate, ipratropium-albuterol, ondansetron (ZOFRAN) IV, oxyCODONE, traMADol Assessment/Plan: Principal Problem:   Parapneumonic effusion Active Problems:   CAD (coronary artery disease)   Carotid artery disease   Hypertensive heart disease   GERD (gastroesophageal reflux disease)   Abdominal aortic aneurysm without rupture   COPD GOLD II still smoking    Cigarette smoker   CKD (chronic kidney disease), stage IV   PAD (peripheral artery disease)   Sleep apnea   Atrial fibrillation   Chronic diastolic CHF (congestive heart failure)   Pleural effusion  Pleural effusion: s/p VATS 7/26. Breathing improved.  - VATS cultures 7/26: No fungus, gram stain no organisms with many PMN and mononuclear cells, AFP pending, tissue culture no  growth - Rocephin day 6, will finish 7 day course - Pleural fluid cultures 7/22: AFB negative, Fungal negative, cultures no growth - Restart warfarin per pharmacy - Hgb stable at 12.2 from 12.6 yesterday, will trend  Constipation: Has been getting colace, no BM during hospitalization. Will give miralax today and reassess.   Hyperkalemia: Resolved. K 5.0 today. - BMET in AM  Chronic diastolic CHF (congestive heart failure): Diastolic heart failure, Grade 2. Edema improved today.  - Stop IV lasix, resume home PO regimen - Cr trending up to, 2.86 - Strict I&O - Daily weights - Continue Amlodipine 10mg   - Cont Bisoprolol 5mg  daily  AKI on CKD (chronic kidney disease), stage IV:  - SCr tredning up, 2.86 - Will stop IV lasix, resume home PO regimen. Does not look fluid overloaded.  - Repeat BMP in AM  Carotid artery disease- s/p CABG: Continue home medications - Atorva 80mg  - Bisoprolol 5mg  daily - Amlodipine 10mg   Atrial fibrillation: INR 1.36. Rate controlled on Bisoprolol at home,  - CHADSVasc at least 4 - Restart warfarin per pharmacy today - Continue bisoprolol  COPD GOLD II still smoking. PT eval as  pt will require home O2 with activities.  - Will need home O2 on discharge - Continue Spirvia - Goal O2 sat 89-94% - Albuterol PRN  Right lower back/hip pain: - tramadol 50 mg qHS  Cigarette smoker - Needs to stop smoking. - RN to provide smoking cessation education  Sleep apnea - Patient says he does not want CPAP while in the hospital  FEN/GI: - HH - Protonix  Dispo: Disposition is deferred at this time, awaiting improvement of current medical problems.  Anticipated discharge in approximately 1-3 day(s).   The patient does have a current PCP Christain Sacramento, MD) and does need an [facility] hospital follow-up appointment after discharge.  The patient does not have transportation limitations that hinder transportation to clinic appointments.   LOS: 7 days    Maryellen Pile, MD 12/19/2014, 2:07 PM

## 2014-12-19 NOTE — Progress Notes (Signed)
ANTICOAGULATION CONSULT NOTE - Follow Up Consult  Pharmacy Consult for Coumadin Indication: atrial fibrillation  Allergies  Allergen Reactions  . Niaspan [Niacin Er] Hives    Patient Measurements: Height: 5\' 7"  (170.2 cm) Weight: 177 lb 0.5 oz (80.3 kg) IBW/kg (Calculated) : 66.1 Heparin Dosing Weight:   Vital Signs: Temp: 98.4 F (36.9 C) (07/28 1143) Temp Source: Oral (07/28 1143) BP: 124/64 mmHg (07/28 0715) Pulse Rate: 86 (07/28 0855)  Labs:  Recent Labs  12/17/14 0535  12/17/14 1000 12/18/14 0500 12/18/14 1300 12/19/14 0232  HGB  --   < > 13.3 12.6*  --  12.2*  HCT  --   --  41.4 40.0  --  38.2*  PLT  --   --  240 251  --  215  LABPROT 16.7*  --   --  16.8*  --  16.9*  INR 1.34  --   --  1.35  --  1.36  CREATININE  --   < > 1.96* 2.60* 2.61* 2.86*  < > = values in this interval not displayed.  Estimated Creatinine Clearance: 25.1 mL/min (by C-G formula based on Cr of 2.86).  Assessment: 69yo on warfarin pta for afib. Pt admitted with large pleural effusion and now s/p VATS on 7/26. Warfarin to resume today. INR is subtherapeutic as expected after being held for several days. No bleeding noted.   Goal of Therapy:  INR 2-3   Plan:  - Warfarin 7.5mg  PO x 1 tonight (giving boosted dose today but can likely be restarted on home dose of 5mg  daily soon as he was previously therapeutic on this regimen)  - Daily INR  Salome Arnt, PharmD, BCPS Pager # [phone] 12/19/2014 1:59 PM

## 2014-12-19 NOTE — Progress Notes (Addendum)
[address]       [place], [address]             [phone]      2 Days Post-Op Procedure(s) (LRB): RIGHT VIDEO ASSISTED THORACOSCOPY (VATS) (Right) EMPYEMA DRAINAGE (Right) Subjective: Feels pretty comfortable overall  Objective: Vital signs in last 24 hours: Temp:  [98 F (36.7 C)-98.3 F (36.8 C)] 98.3 F (36.8 C) (07/28 0715) Pulse Rate:  [57-78] 78 (07/28 0715) Cardiac Rhythm:  [-] Atrial fibrillation (07/28 0750) Resp:  [12-27] 23 (07/28 0715) BP: (110-134)/(51-72) 124/64 mmHg (07/28 0715) SpO2:  [90 %-96 %] 94 % (07/28 0715)  Hemodynamic parameters for last 24 hours:    Intake/Output from previous day: 07/27 0701 - 07/28 0700 In: 2456.3 [P.O.:1080; I.V.:1326.3; IV Piggyback:50] Out: 1355 [Urine:1275; Chest Tube:80] Intake/Output this shift: Total I/O In: -  Out: 200 [Urine:200]  General appearance: alert, cooperative and no distress Heart: irregularly irregular rhythm Lungs: some basilar crackles Abdomen: nontender, soft Extremities: minor edema Wound: dressing intact  Lab Results:  Recent Labs  12/18/14 0500 12/19/14 0232  WBC 7.6 7.9  HGB 12.6* 12.2*  HCT 40.0 38.2*  PLT 251 215   BMET:  Recent Labs  12/18/14 1300 12/19/14 0232  NA 131* 131*  K 4.9 5.0  CL 95* 96*  CO2 29 28  GLUCOSE 111* 91  BUN 40* 41*  CREATININE 2.61* 2.86*  CALCIUM 8.7* 8.7*    PT/INR:  Recent Labs  12/19/14 0232  LABPROT 16.9*  INR 1.36   ABG    Component Value Date/Time   PHART 7.302* 12/18/2014 0405   HCO3 29.2* 12/18/2014 0405   TCO2 31.1 12/18/2014 0405   ACIDBASEDEF 5.0* 11/29/2014 0948   O2SAT 90.5 12/18/2014 0405   CBG (last 3)   Recent Labs  12/17/14 1557  GLUCAP 88   Results for orders placed or performed during the hospital encounter of 12/12/14  AFB culture with smear     Status: None (Preliminary result)   Collection Time: 12/13/14  4:12 PM  Result Value Ref Range Status   Specimen Description PLEURAL  FLUID  Final   Special Requests NONE  Final   Acid Fast Smear   Final    NO ACID FAST BACILLI SEEN Performed at [facility]    Culture   Final    CULTURE WILL BE EXAMINED FOR 6 WEEKS BEFORE ISSUING A FINAL REPORT Performed at [facility]    Report Status PENDING  Incomplete  Body fluid culture     Status: None   Collection Time: 12/13/14  4:12 PM  Result Value Ref Range Status   Specimen Description PLEURAL FLUID  Final   Special Requests NONE  Final   Gram Stain   Final    RARE WBC PRESENT,BOTH PMN AND MONONUCLEAR NO ORGANISMS SEEN CONFIRMED BY M.VESTAL    Culture NO GROWTH 3 DAYS  Final   Report Status 12/16/2014 FINAL  Final  Fungal stain     Status: None   Collection Time: 12/13/14  4:12 PM  Result Value Ref Range Status   Specimen Description PLEURAL FLUID  Final   Special Requests NONE  Final   Fungal Smear   Final    NO YEAST OR FUNGAL ELEMENTS SEEN Performed at [facility]    Report Status 12/15/2014 FINAL  Final  Fungus Culture with Smear     Status: None (Preliminary result)   Collection Time: 12/17/14  2:12 PM  Result Value Ref Range Status   Specimen Description FLUID RIGHT PLEURAL  Final   Special Requests NONE  Final   Fungal Smear   Final    NO YEAST OR FUNGAL ELEMENTS SEEN Performed at [facility]    Culture   Final    CULTURE IN PROGRESS FOR FOUR WEEKS Performed at [facility]    Report Status PENDING  Incomplete  AFB culture with smear     Status: None (Preliminary result)   Collection Time: 12/17/14  2:12 PM  Result Value Ref Range Status   Specimen Description FLUID RIGHT PLEURAL  Final   Special Requests NONE  Final   Acid Fast Smear   Final    NO ACID FAST BACILLI SEEN Performed at [facility]    Culture   Final    CULTURE WILL BE EXAMINED FOR 6 WEEKS BEFORE ISSUING A FINAL REPORT Performed at [facility]    Report Status PENDING  Incomplete  Culture, body fluid-bottle      Status: None (Preliminary result)   Collection Time: 12/17/14  2:12 PM  Result Value Ref Range Status   Specimen Description FLUID RIGHT PLEURAL  Final   Special Requests NONE  Final   Culture NO GROWTH 1 DAY  Final   Report Status PENDING  Incomplete  Gram stain     Status: None   Collection Time: 12/17/14  2:12 PM  Result Value Ref Range Status   Specimen Description FLUID RIGHT PLEURAL  Final   Special Requests NONE  Final   Gram Stain   Final    ABUNDANT WBC PRESENT,BOTH PMN AND MONONUCLEAR NO ORGANISMS SEEN CONFIRMED BY K. BARR    Report Status 12/18/2014 FINAL  Final  Fungus Culture with Smear     Status: None (Preliminary result)   Collection Time: 12/17/14  2:20 PM  Result Value Ref Range Status   Specimen Description TISSUE RIGHT PLEURAL  Final   Special Requests NONE  Final   Fungal Smear   Final    NO YEAST OR FUNGAL ELEMENTS SEEN Performed at [facility]    Culture   Final    CULTURE IN PROGRESS FOR FOUR WEEKS Performed at [facility]    Report Status PENDING  Incomplete  Tissue culture     Status: None (Preliminary result)   Collection Time: 12/17/14  2:20 PM  Result Value Ref Range Status   Specimen Description TISSUE RIGHT PLEURAL  Final   Special Requests NONE  Final   Gram Stain   Final    MODERATE WBC PRESENT, PREDOMINANTLY MONONUCLEAR NO ORGANISMS SEEN Performed at [facility]    Culture PENDING  Incomplete   Report Status PENDING  Incomplete  AFB culture with smear     Status: None (Preliminary result)   Collection Time: 12/17/14  2:20 PM  Result Value Ref Range Status   Specimen Description TISSUE RIGHT PLEURAL  Final   Special Requests NONE  Final   Acid Fast Smear   Final    NO ACID FAST BACILLI SEEN Performed at [facility]    Culture   Final    CULTURE WILL BE EXAMINED FOR 6 WEEKS BEFORE ISSUING A FINAL REPORT Performed at [facility]    Report Status PENDING  Incomplete   Meds Scheduled  Meds: . acetaminophen  1,000 mg Oral 4 times per day   Or  . acetaminophen (TYLENOL) oral liquid 160 mg/5 mL  1,000  mg Oral 4 times per day  . amLODipine  10 mg Oral QAC breakfast  . antiseptic oral rinse  7 mL Mouth Rinse BID  . atorvastatin  80 mg Oral QHS  . bisacodyl  10 mg Oral Daily  . bisoprolol  5 mg Oral Daily  . cefTRIAXone (ROCEPHIN)  IV  2 g Intravenous Q24H  . furosemide  40 mg Intravenous Daily  . levalbuterol  0.63 mg Nebulization Q6H  . pantoprazole  40 mg Oral Daily  . senna-docusate  1 tablet Oral QHS  . sodium chloride  3 mL Intravenous Q12H   Continuous Infusions: . sodium chloride Stopped (12/19/14 0400)  . sodium chloride 10 mL/hr at 12/17/14 1250   PRN Meds:.acetaminophen **OR** acetaminophen, diphenhydrAMINE **OR** diphenhydrAMINE, diphenhydrAMINE-zinc acetate, ipratropium-albuterol, ondansetron (ZOFRAN) IV, oxyCODONE, traMADol  Xrays Dg Chest Port 1 View  12/19/2014   CLINICAL DATA:  Follow-up of pleural effusion, status post empyema drainage on December 17 2014  EXAM: PORTABLE CHEST - 1 VIEW  COMPARISON:  Portable chest x-ray of December 18, 2014  FINDINGS: The lungs are adequately inflated. The right hemidiaphragm is slightly better demonstrated today. The interstitial markings remain increased bilaterally with patchy areas of alveolar airspace opacity. The right-sided chest tube is unchanged in position. There is no pneumothorax. The cardiac silhouette remains enlarged. The central pulmonary vascularity is mildly prominent. There are post CABG changes.  IMPRESSION: Allowing for differences in positioning and inflation there has not been significant interval change since yesterday's study. There is no pneumothorax nor significant pleural effusion.   Electronically Signed   By: Hau  Martinique M.D.   On: 12/19/2014 08:13   Dg Chest Port 1 View  12/18/2014   CLINICAL DATA:  69 year old male with history of pneumothorax.  EXAM: PORTABLE CHEST - 1 VIEW  COMPARISON:  Chest  x-ray 12/17/2014.  FINDINGS: Lung volumes are low. Bibasilar opacities may reflect areas of atelectasis and/or consolidation, likely with superimposed small bilateral pleural effusions. Right-sided chest tube in position with tip directed into the medial aspect of the upper right hemithorax. No appreciable pneumothorax identified at this time. Vascular crowding, without frank pulmonary edema. Mild cardiomegaly. Upper mediastinal contours are within normal limits allowing for patient's low lung volumes and slight patient rotation to the right. Atherosclerosis in the thoracic aorta. Status post median sternotomy for CABG.  IMPRESSION: 1. Right-sided chest tube remains in position. No right pneumothorax. 2. Low lung volumes with bibasilar areas of atelectasis and/or consolidation, likely with small bilateral pleural effusions. 3. Cardiomegaly with pulmonary venous congestion, but no frank pulmonary edema. 4. Atherosclerosis.   Electronically Signed   By: Vinnie Langton M.D.   On: 12/18/2014 08:59   Dg Chest Port 1 View  12/17/2014   CLINICAL DATA:  Postoperative right sided video-assisted thoracoscopic surgery with drainage  EXAM: PORTABLE CHEST - 1 VIEW  COMPARISON:  December 14, 2014  FINDINGS: There is a chest tube on the right. There has been significant drainage of fluid from the right lung. A small amount of residual pleural effusion remains with right base atelectasis.  There is mild interstitial edema with cardiomegaly. The pulmonary vascularity is normal. No adenopathy. No bone lesions. Patient is status post coronary artery bypass grafting. There are foci of carotid artery calcification bilaterally.  IMPRESSION: Chest tube on the right, status post drainage of pleural effusion. There is a small residua right effusion with mild right base atelectasis. There is mild edema with cardiomegaly consistent with a mild degree of underlying congestive heart  failure. There is currently no appreciable airspace  consolidation.   Electronically Signed   By: Lowella Grip III M.D.   On: 12/17/2014 15:48    Assessment/Plan: S/P Procedure(s) (LRB): RIGHT VIDEO ASSISTED THORACOSCOPY (VATS) (Right) EMPYEMA DRAINAGE (Right)  1 chest tube with 133ml recorded yesterday, no air leak, poss d/c soon 2 medical management per primary svc   LOS: 7 days    GOLD,WAYNE E 12/19/2014   Chart reviewed, patient examined, agree with above. Chest tube with no output today. Will remove.  PA/LAT cxr in the am.  He can resume coumadin per medicine for chronic atrial fib. We will remove the chest tube suture in the office in 2 weeks.  Wean oxygen and mobilize.

## 2014-12-19 NOTE — Progress Notes (Signed)
  Date: 12/19/2014  Patient name: [name]  Medical record number: [id number]  Date of birth: 1946-02-06   This patient has been seen and the plan of care was discussed with the house staff. Please see their note for complete details. I concur with their findings with the following additions/corrections: Pt sitting SOB, c/o unable to have BM and back pain. Walking without walker. Wants to go home. CVTS removing chest tube. Dr Charlynn Grimes resumed warfarin.   Bartholomew Crews, MD 12/19/2014, 2:53 PM

## 2014-12-20 ENCOUNTER — Inpatient Hospital Stay (HOSPITAL_COMMUNITY): Payer: Medicare Other

## 2014-12-20 DIAGNOSIS — J918 Pleural effusion in other conditions classified elsewhere: Secondary | ICD-10-CM

## 2014-12-20 LAB — CBC
HCT: 39.1 % (ref 39.0–52.0)
Hemoglobin: 12.5 g/dL — ABNORMAL LOW (ref 13.0–17.0)
MCH: 28.7 pg (ref 26.0–34.0)
MCHC: 32 g/dL (ref 30.0–36.0)
MCV: 89.7 fL (ref 78.0–100.0)
Platelets: 218 10*3/uL (ref 150–400)
RBC: 4.36 MIL/uL (ref 4.22–5.81)
RDW: 15 % (ref 11.5–15.5)
WBC: 5.8 10*3/uL (ref 4.0–10.5)

## 2014-12-20 LAB — BASIC METABOLIC PANEL
Anion gap: 8 (ref 5–15)
BUN: 36 mg/dL — ABNORMAL HIGH (ref 6–20)
CO2: 28 mmol/L (ref 22–32)
Calcium: 9 mg/dL (ref 8.9–10.3)
Chloride: 99 mmol/L — ABNORMAL LOW (ref 101–111)
Creatinine, Ser: 2.36 mg/dL — ABNORMAL HIGH (ref 0.61–1.24)
GFR calc Af Amer: 31 mL/min — ABNORMAL LOW (ref >60)
GFR calc non Af Amer: 27 mL/min — ABNORMAL LOW (ref >60)
Glucose, Bld: 86 mg/dL (ref 65–99)
Potassium: 4.5 mmol/L (ref 3.5–5.1)
Sodium: 135 mmol/L (ref 135–145)

## 2014-12-20 LAB — PROTIME-INR
INR: 1.32 (ref 0.00–1.49)
Prothrombin Time: 16.5 seconds — ABNORMAL HIGH (ref 11.6–15.2)

## 2014-12-20 MED ORDER — CEPHALEXIN 500 MG PO CAPS: 1000.0000 mg | ORAL_CAPSULE | Freq: Two times a day (BID) | ORAL | Status: DC

## 2014-12-20 MED ORDER — OXYCODONE HCL 5 MG PO TABS: 5.0000 mg | ORAL_TABLET | Freq: Four times a day (QID) | ORAL | Status: DC | PRN

## 2014-12-20 MED ORDER — CEPHALEXIN 500 MG PO CAPS: 500.0000 mg | ORAL_CAPSULE | Freq: Four times a day (QID) | ORAL | Status: DC

## 2014-12-20 MED ORDER — ACETAMINOPHEN 325 MG PO TABS: 650.0000 mg | ORAL_TABLET | Freq: Four times a day (QID) | ORAL | Status: DC | PRN

## 2014-12-20 MED ORDER — WARFARIN SODIUM 7.5 MG PO TABS: 7.5000 mg | ORAL_TABLET | Freq: Once | ORAL | Status: DC

## 2014-12-20 NOTE — Progress Notes (Signed)
  Date: 12/20/2014  Patient name: Barry Lawrence  Medical record number: [id number]  Date of birth: October 29, 1945   This patient has been seen and the plan of care was discussed with the house staff. Please see their note for complete details. I concur with their findings with the following additions/corrections: Mr Orrick is sitting SOB. No distress except O2 i making nose dry. Agrees that leg edema better - still present though. Cr down to 2.36 from 2.86. Check with CVTS regarding dispo plans.   Bartholomew Crews, MD 12/20/2014, 1:10 PM

## 2014-12-20 NOTE — Progress Notes (Signed)
Utilization review completed.  

## 2014-12-20 NOTE — Progress Notes (Addendum)
SATURATION QUALIFICATIONS: (This note is used to comply with regulatory documentation for home oxygen)  Patient Saturations on Room Air at Rest = 87%  Patient Saturations on Room Air while Ambulating = 83%  Patient Saturations on 4 Liters of oxygen while Ambulating = 93%  MD made aware of home 02 needs

## 2014-12-20 NOTE — Progress Notes (Signed)
Patient has orders to d/c- RN asked patient for family to bring his O2 to the room so that he could leave. Patient stated that he did not want the o2 brought upstairs but instead wanted his family to meet him downstairs. RN educated the importance of seeing the O2 to make sure that he had it and that it was ready but patient refused. Patient asked family to meet him downstairs- RN printed AVS, took out IV's , and will have patient wheeled down.

## 2014-12-20 NOTE — Progress Notes (Signed)
Pt discharged to home. Pt. Is alert and oriented. Pt is hemodynamically stable. AVS reviewed with pt. Capable of re verbalizing medication regimen. Discharge plan appropriate and in place.

## 2014-12-20 NOTE — Progress Notes (Signed)
3 Days Post-Op Procedure(s) (LRB): RIGHT VIDEO ASSISTED THORACOSCOPY (VATS) (Right) EMPYEMA DRAINAGE (Right) Subjective:  Sore but walking and breathing ok  Objective: Vital signs in last 24 hours: Temp:  [98.5 F (36.9 C)-99 F (37.2 C)] 98.9 F (37.2 C) (07/29 1412) Pulse Rate:  [44-75] 44 (07/29 1412) Cardiac Rhythm:  [-] Atrial fibrillation (07/29 0728) Resp:  [18] 18 (07/29 1412) BP: (126-143)/(60-72) 126/71 mmHg (07/29 1412) SpO2:  [93 %-99 %] 97 % (07/29 1412) Weight:  [81.647 kg (180 lb)-82 kg (180 lb 12.4 oz)] 82 kg (180 lb 12.4 oz) (07/29 0530)  Hemodynamic parameters for last 24 hours:    Intake/Output from previous day: 07/28 0701 - 07/29 0700 In: 770 [P.O.:720; IV Piggyback:50] Out: 2655 [Urine:2625; Chest Tube:30] Intake/Output this shift: Total I/O In: 650 [P.O.:600; IV Piggyback:50] Out: 550 [Urine:550]  General appearance: alert and cooperative Lungs: diminished breath sounds base - right Wound: chest tube site ok  Lab Results:  Recent Labs  12/19/14 0232 12/20/14 0544  WBC 7.9 5.8  HGB 12.2* 12.5*  HCT 38.2* 39.1  PLT 215 218   BMET:  Recent Labs  12/19/14 0232 12/20/14 0544  NA 131* 135  K 5.0 4.5  CL 96* 99*  CO2 28 28  GLUCOSE 91 86  BUN 41* 36*  CREATININE 2.86* 2.36*  CALCIUM 8.7* 9.0    PT/INR:  Recent Labs  12/20/14 0544  LABPROT 16.5*  INR 1.32   ABG    Component Value Date/Time   PHART 7.302* 12/18/2014 0405   HCO3 29.2* 12/18/2014 0405   TCO2 31.1 12/18/2014 0405   ACIDBASEDEF 5.0* 11/29/2014 0948   O2SAT 90.5 12/18/2014 0405   CBG (last 3)   Recent Labs  12/17/14 1557  GLUCAP 88    CLINICAL DATA: Status post empyema drainage 3 days ago  EXAM: CHEST 2 VIEW  COMPARISON: [facility] portable chest x-ray of December 19, 2014  FINDINGS: The lungs are well-expanded. The right-sided chest tube is been removed. There is no pneumothorax. There remains pleural thickening-pleural fluid along the lateral aspect  of the right hemithorax, in the minor fissure, and at the right lung base. This is more conspicuous today. The left lung is hyperinflated. The interstitial markings of both lungs have improved. The cardiac silhouette remains enlarged. The pulmonary vascularity is less engorged. There is are 6 intact sternal wires. The bony thorax exhibits no acute abnormality.  IMPRESSION: Mild interval improvement in the appearance of the pulmonary interstitium consistent with resolving interstitial edema. There is persistent pleural fluid -pleural thickening on the right which is slightly more conspicuous today.   Electronically Signed  By: Kienan Martinique M.D.  On: 12/20/2014 08:06    Operative cultures negative  Cytology negative for malignancy  Assessment/Plan: S/P Procedure(s) (LRB): RIGHT VIDEO ASSISTED THORACOSCOPY (VATS) (Right) EMPYEMA DRAINAGE (Right)  He is stable. He needs to continue working on IS and ambulation. I would continue antibiotic for another week for possible persistent pneumonia with parapneumonic effusion. I will see him back in the office in 2 weeks to remove his chest tube suture and do a CXR.   LOS: 8 days    Gaye Pollack 12/20/2014

## 2014-12-20 NOTE — Care Management Note (Addendum)
Case Management Note  Patient Details  Name: Barry Lawrence MRN: [place]:[id number] Date of Birth: 03/26/1946  Subjective/Objective:   Patient lives with spouse, he has home oxygen with [facility], he states he is on 3 liters at home.  NCM informed patient to have his wife bring an oxygen tank when he goes home.  Patient is now on 4 liters, order is for 4 liters,  NCM contacted Jermaine with [facility] to inform him of the new orders for his oxygen.  Since patient has oxygen at home there is nothing [facility] needs to do but send orders in with new oxygen rate.                  Action/Plan:   Expected Discharge Date:                  Expected Discharge Plan:  Home/Self Care  In-House Referral:     Discharge planning Services  CM Consult  Post Acute Care Choice:    Choice offered to:     DME Arranged:    DME Agency:     HH Arranged:    [place] Agency:     Status of Service:     Medicare Important Message Given:  Yes-third notification given Date Medicare IM Given:    Medicare IM give by:    Date Additional Medicare IM Given:    Additional Medicare Important Message give by:     If discussed at [place] of Stay Meetings, dates discussed:    Additional Comments:  [name], RN 12/20/2014, 3:08 PM

## 2014-12-20 NOTE — Progress Notes (Signed)
Subjective: Patient feels well this morning. Breathing doing well. Needing O2.   Objective: Vital signs in last 24 hours: Filed Vitals:   12/20/14 1008 12/20/14 1236 12/20/14 1412 12/20/14 1518  BP:   126/71   Pulse:   44   Temp:   98.9 F (37.2 C)   TempSrc:   Oral   Resp:   18   Height:      Weight:      SpO2: 99% 93% 97% 95%   Weight change:   Intake/Output Summary (Last 24 hours) at 12/20/14 1704 Last data filed at 12/20/14 1618  Gross per 24 hour  Intake    650 ml  Output   1575 ml  Net   -925 ml   Physical Exam GENERAL- alert, co-operative, appears as stated age, not in any distress. CARDIAC- irregularly irregular rhythm, no murmurs, rubs or gallops. RESP- Moving equal volumes of air, and crackles in left lung base ABDOMEN- Soft, nontender, no guarding or rebound, bowel sounds present. EXTREMITIES- pulse 2+, symmetric, 1+ pedal edema SKIN- Warm, dry, No rash or lesion, Wounds clean and dry, no erythema or signs of infection. PSYCH- Normal mood and affect, appropriate thought content and speech.  Lab Results: Basic Metabolic Panel:  Recent Labs Lab 12/19/14 0232 12/20/14 0544  NA 131* 135  K 5.0 4.5  CL 96* 99*  CO2 28 28  GLUCOSE 91 86  BUN 41* 36*  CREATININE 2.86* 2.36*  CALCIUM 8.7* 9.0   Liver Function Tests:  Recent Labs Lab 12/19/14 0232  AST 15  ALT 16*  ALKPHOS 123  BILITOT 0.5  PROT 5.7*  ALBUMIN 2.6*   CBC:  Recent Labs Lab 12/19/14 0232 12/20/14 0544  WBC 7.9 5.8  HGB 12.2* 12.5*  HCT 38.2* 39.1  MCV 90.3 89.7  PLT 215 218   CBG:  Recent Labs Lab 12/17/14 1557  GLUCAP 88   Coagulation:  Recent Labs Lab 12/17/14 0535 12/18/14 0500 12/19/14 0232 12/20/14 0544  LABPROT 16.7* 16.8* 16.9* 16.5*  INR 1.34 1.35 1.36 1.32   Micro Results: Recent Results (from the past 240 hour(s))  AFB culture with smear     Status: None (Preliminary result)   Collection Time: 12/13/14  4:12 PM  Result Value Ref Range Status    Specimen Description PLEURAL FLUID  Final   Special Requests NONE  Final   Acid Fast Smear   Final    NO ACID FAST BACILLI SEEN Performed at [facility]    Culture   Final    CULTURE WILL BE EXAMINED FOR 6 WEEKS BEFORE ISSUING A FINAL REPORT Performed at [facility]    Report Status PENDING  Incomplete  Body fluid culture     Status: None   Collection Time: 12/13/14  4:12 PM  Result Value Ref Range Status   Specimen Description PLEURAL FLUID  Final   Special Requests NONE  Final   Gram Stain   Final    RARE WBC PRESENT,BOTH PMN AND MONONUCLEAR NO ORGANISMS SEEN CONFIRMED BY M.VESTAL    Culture NO GROWTH 3 DAYS  Final   Report Status 12/16/2014 FINAL  Final  Fungal stain     Status: None   Collection Time: 12/13/14  4:12 PM  Result Value Ref Range Status   Specimen Description PLEURAL FLUID  Final   Special Requests NONE  Final   Fungal Smear   Final    NO YEAST OR FUNGAL ELEMENTS SEEN Performed at [facility]    Report Status 12/15/2014 FINAL  Final  Fungus Culture with Smear     Status: None (Preliminary result)   Collection Time: 12/17/14  2:12 PM  Result Value Ref Range Status   Specimen Description FLUID RIGHT PLEURAL  Final   Special Requests NONE  Final   Fungal Smear   Final    NO YEAST OR FUNGAL ELEMENTS SEEN Performed at [facility]    Culture   Final    CULTURE IN PROGRESS FOR FOUR WEEKS Performed at [facility]    Report Status PENDING  Incomplete  AFB culture with smear     Status: None (Preliminary result)   Collection Time: 12/17/14  2:12 PM  Result Value Ref Range Status   Specimen Description FLUID RIGHT PLEURAL  Final   Special Requests NONE  Final   Acid Fast Smear   Final    NO ACID FAST BACILLI SEEN Performed at [facility]    Culture   Final    CULTURE WILL BE EXAMINED FOR 6 WEEKS BEFORE ISSUING A FINAL REPORT Performed at [facility]    Report Status PENDING  Incomplete    Culture, body fluid-bottle     Status: None (Preliminary result)   Collection Time: 12/17/14  2:12 PM  Result Value Ref Range Status   Specimen Description FLUID RIGHT PLEURAL  Final   Special Requests NONE  Final   Culture NO GROWTH 3 DAYS  Final   Report Status PENDING  Incomplete  Gram stain     Status: None   Collection Time: 12/17/14  2:12 PM  Result Value Ref Range Status   Specimen Description FLUID RIGHT PLEURAL  Final   Special Requests NONE  Final   Gram Stain   Final    ABUNDANT WBC PRESENT,BOTH PMN AND MONONUCLEAR NO ORGANISMS SEEN CONFIRMED BY K. BARR    Report Status 12/18/2014 FINAL  Final  Fungus Culture with Smear     Status: None (Preliminary result)   Collection Time: 12/17/14  2:20 PM  Result Value Ref Range Status   Specimen Description TISSUE RIGHT PLEURAL  Final   Special Requests NONE  Final   Fungal Smear   Final    NO YEAST OR FUNGAL ELEMENTS SEEN Performed at [facility]    Culture   Final    CULTURE IN PROGRESS FOR FOUR WEEKS Performed at [facility]    Report Status PENDING  Incomplete  Tissue culture     Status: None (Preliminary result)   Collection Time: 12/17/14  2:20 PM  Result Value Ref Range Status   Specimen Description TISSUE RIGHT PLEURAL  Final   Special Requests NONE  Final   Gram Stain   Final    MODERATE WBC PRESENT, PREDOMINANTLY MONONUCLEAR NO ORGANISMS SEEN Performed at [facility]    Culture   Final    NO GROWTH 3 DAYS Performed at [facility]    Report Status PENDING  Incomplete  AFB culture with smear     Status: None (Preliminary result)   Collection Time: 12/17/14  2:20 PM  Result Value Ref Range Status   Specimen Description TISSUE RIGHT PLEURAL  Final   Special Requests NONE  Final   Acid Fast Smear   Final    NO ACID FAST BACILLI SEEN Performed at [facility]    Culture   Final    CULTURE WILL BE EXAMINED FOR 6 WEEKS BEFORE ISSUING  A FINAL REPORT Performed at  [facility]    Report Status PENDING  Incomplete   Studies/Results: Dg Chest 2 View  12/20/2014   CLINICAL DATA:  Status post empyema drainage 3 days ago  EXAM: CHEST  2 VIEW  COMPARISON:  AP portable chest x-ray of December 19, 2014  FINDINGS: The lungs are well-expanded. The right-sided chest tube is been removed. There is no pneumothorax. There remains pleural thickening-pleural fluid along the lateral aspect of the right hemithorax, in the minor fissure, and at the right lung base. This is more conspicuous today. The left lung is hyperinflated. The interstitial markings of both lungs have improved. The cardiac silhouette remains enlarged. The pulmonary vascularity is less engorged. There is are 6 intact sternal wires. The bony thorax exhibits no acute abnormality.  IMPRESSION: Mild interval improvement in the appearance of the pulmonary interstitium consistent with resolving interstitial edema. There is persistent pleural fluid -pleural thickening on the right which is slightly more conspicuous today.   Electronically Signed   By: Byron  Martinique M.D.   On: 12/20/2014 08:06   Dg Chest Port 1 View  12/19/2014   CLINICAL DATA:  Follow-up of pleural effusion, status post empyema drainage on December 17 2014  EXAM: PORTABLE CHEST - 1 VIEW  COMPARISON:  Portable chest x-ray of December 18, 2014  FINDINGS: The lungs are adequately inflated. The right hemidiaphragm is slightly better demonstrated today. The interstitial markings remain increased bilaterally with patchy areas of alveolar airspace opacity. The right-sided chest tube is unchanged in position. There is no pneumothorax. The cardiac silhouette remains enlarged. The central pulmonary vascularity is mildly prominent. There are post CABG changes.  IMPRESSION: Allowing for differences in positioning and inflation there has not been significant interval change since yesterday's study. There is no pneumothorax nor significant pleural effusion.   Electronically  Signed   By: Imran  Martinique M.D.   On: 12/19/2014 08:13   Medications: I have reviewed the patient's current medications. Scheduled Meds: . amLODipine  10 mg Oral QAC breakfast  . antiseptic oral rinse  7 mL Mouth Rinse BID  . atorvastatin  80 mg Oral QHS  . bisacodyl  10 mg Oral Daily  . bisoprolol  5 mg Oral Daily  . cefTRIAXone (ROCEPHIN)  IV  2 g Intravenous Q24H  . furosemide  40 mg Intravenous Daily  . levalbuterol  0.63 mg Nebulization Q6H  . pantoprazole  40 mg Oral Daily  . polyethylene glycol  17 g Oral BID  . senna-docusate  1 tablet Oral QHS  . sodium chloride  3 mL Intravenous Q12H  . warfarin  7.5 mg Oral ONCE-1800  . Warfarin - Pharmacist Dosing Inpatient   Does not apply q1800   Continuous Infusions: . sodium chloride 10 mL/hr at 12/17/14 1250   PRN Meds:.acetaminophen, diphenhydrAMINE **OR** diphenhydrAMINE, diphenhydrAMINE-zinc acetate, ipratropium-albuterol, ondansetron (ZOFRAN) IV, oxyCODONE, traMADol Assessment/Plan: Principal Problem:   Parapneumonic effusion Active Problems:   CAD (coronary artery disease)   Carotid artery disease   Hypertensive heart disease   GERD (gastroesophageal reflux disease)   Abdominal aortic aneurysm without rupture   COPD GOLD II still smoking    Cigarette smoker   CKD (chronic kidney disease), stage IV   PAD (peripheral artery disease)   Sleep apnea   Atrial fibrillation   Chronic diastolic CHF (congestive heart failure)   Pleural effusion  Pleural effusion: s/p VATS 7/26. Breathing improved.  - VATS cultures 7/26: No fungus, gram stain no organisms with many  PMN and mononuclear cells, AFP pending, tissue culture no growth - Rocephin day 7, will discharge on 7 day course  - Pleural fluid cultures 7/22: AFB negative, Fungal negative, cultures no growth - Contniue warfarin per pharmacy - Hgb stable at 12.5  - D/C today   Constipation: Had one BM yesterday - Colace - Miralax   Hyperkalemia: Resolved. K 4.5  today.  Chronic diastolic CHF (congestive heart failure): Diastolic heart failure, Grade 2. Edema improved today.  - Stop IV lasix, resume home PO regimen - Cr improving, 2.36 today - Strict I&O - Daily weights - Continue Amlodipine 10mg   - Cont Bisoprolol 5mg  daily  AKI on CKD (chronic kidney disease), stage IV:  - Cr improving, 2.36 today - Continue home PO regimen. Does not look fluid overloaded.   Carotid artery disease- s/p CABG: Continue home medications - Atorva 80mg  - Bisoprolol 5mg  daily - Amlodipine 10mg   Atrial fibrillation: INR 1.32. Rate controlled on Bisoprolol at home,  - CHADSVasc at least 4 - Continue warfarin per pharmacy today - Continue bisoprolol  COPD GOLD II still smoking. Requiring 4L O2 with exercise - Will need home O2 on discharge - Continue Spirvia - Goal O2 sat 89-94% - Albuterol PRN  Right lower back/hip pain: - tramadol 50 mg qHS  Cigarette smoker - Needs to stop smoking. - RN to provide smoking cessation education  Sleep apnea - Patient says he does not want CPAP while in the hospital  FEN/GI: - HH - Protonix   Dispo: Discharge today  The patient does have a current PCP Christain Sacramento, MD) and does need an [facility] hospital follow-up appointment after discharge.  The patient does not have transportation limitations that hinder transportation to clinic appointments.   LOS: 8 days   Maryellen Pile, MD 12/20/2014, 5:04 PM

## 2014-12-20 NOTE — Progress Notes (Signed)
Patient did not want last dose of coumadin before leaving- states that he is going to start it at home

## 2014-12-20 NOTE — Progress Notes (Signed)
ANTICOAGULATION CONSULT NOTE - Follow Up Consult  Pharmacy Consult:  Coumadin Indication: atrial fibrillation  Allergies  Allergen Reactions  . Niaspan [Niacin Er] Hives    Patient Measurements: Height: 5\' 7"  (170.2 cm) Weight: 180 lb 12.4 oz (82 kg) IBW/kg (Calculated) : 66.1  Vital Signs: Temp: 98.5 F (36.9 C) (07/29 0530) Temp Source: Oral (07/29 0530) BP: 127/72 mmHg (07/29 0530) Pulse Rate: 66 (07/29 0530)  Labs:  Recent Labs  12/18/14 0500 12/18/14 1300 12/19/14 0232 12/20/14 0544  HGB 12.6*  --  12.2* 12.5*  HCT 40.0  --  38.2* 39.1  PLT 251  --  215 218  LABPROT 16.8*  --  16.9* 16.5*  INR 1.35  --  1.36 1.32  CREATININE 2.60* 2.61* 2.86* 2.36*    Estimated Creatinine Clearance: 30.7 mL/min (by C-G formula based on Cr of 2.36).    Assessment: 19 YOM with history of Afib on Coumadin PTA.  Coumadin held for VATS and resumed on 12/19/14.  INR is sub-therapeutic as expected.  No bleeding reported.   Goal of Therapy:  INR 2-3    Plan:  - Repeat Coumadin 7.5mg  PO today - Daily PT / INR - F/U with order to d/c Rocephin given plan for 7 days of treatment   Yordi Krager D. Mina Marble, PharmD, BCPS Pager:  [phone] 12/20/2014, 9:32 AM

## 2014-12-20 NOTE — Progress Notes (Signed)
[facility] pharmacy called to verify Keflex dose that patient will be picking up- they state that the dosing looks a little odd and need to make sure that this is what the doctors want. MD Charlynn Grimes has been paged- awaiting call back.

## 2014-12-20 NOTE — Anesthesia Postprocedure Evaluation (Signed)
  Anesthesia Post-op Note  Patient: Barry Lawrence  Procedure(s) Performed: Procedure(s): RIGHT VIDEO ASSISTED THORACOSCOPY (VATS) (Right) EMPYEMA DRAINAGE (Right)  Patient Location: PACU  Anesthesia Type:General  Level of Consciousness: awake  Airway and Oxygen Therapy: Patient Spontanous Breathing  Post-op Pain: mild  Post-op Assessment: Post-op Vital signs reviewed, Patient's Cardiovascular Status Stable, Respiratory Function Stable, Patent Airway, No signs of Nausea or vomiting and Pain level controlled LLE Motor Response: Purposeful movement   RLE Motor Response: Purposeful movement        Post-op Vital Signs: Reviewed and stable  Last Vitals:  Filed Vitals:   12/20/14 1412  BP: 126/71  Pulse: 44  Temp: 37.2 C  Resp: 18    Complications: No apparent anesthesia complications

## 2014-12-20 NOTE — Progress Notes (Signed)
RN verified o2 in the car.

## 2014-12-21 LAB — TISSUE CULTURE: Culture: NO GROWTH

## 2014-12-22 LAB — CULTURE, BODY FLUID-BOTTLE

## 2014-12-22 LAB — CULTURE, BODY FLUID W GRAM STAIN -BOTTLE: Culture: NO GROWTH

## 2014-12-22 NOTE — Discharge Summary (Signed)
Name: Barry Lawrence MRN: [id number] DOB: Aug 29, 1945 69 y.o. PCP: Barry [place], MD  Date of Admission: 12/12/2014  4:27 PM Date of Discharge: 12/20/2014 Attending Physician: Dr. Larey Lawrence  Discharge Diagnosis: Principal Problem:   Parapneumonic effusion Active Problems:   CAD (coronary artery disease)   Carotid artery disease   Hypertensive heart disease   GERD (gastroesophageal reflux disease)   Abdominal aortic aneurysm without rupture   COPD GOLD II still smoking    Cigarette smoker   CKD (chronic kidney disease), stage IV   PAD (peripheral artery disease)   Sleep apnea   Atrial fibrillation   Chronic diastolic CHF (congestive heart failure)   Pleural effusion  Discharge Medications:   Medication List    TAKE these medications        albuterol 108 (90 BASE) MCG/ACT inhaler  Commonly known as:  PROVENTIL HFA;VENTOLIN HFA  Inhale 2 puffs into the lungs every 6 (six) hours as needed for wheezing or shortness of breath.     amLODipine 10 MG tablet  Commonly known as:  NORVASC  Take 10 mg by mouth daily before breakfast.     atorvastatin 80 MG tablet  Commonly known as:  LIPITOR  Take 80 mg by mouth at bedtime.     bisoprolol 5 MG tablet  Commonly known as:  ZEBETA  Take 5 mg by mouth daily.     carvedilol 12.5 MG tablet  Commonly known as:  COREG  Take 12.5 mg by mouth daily.     cephALEXin 500 MG capsule  Commonly known as:  KEFLEX  Take 1 capsule (500 mg total) by mouth 4 (four) times daily.     furosemide 40 MG tablet  Commonly known as:  LASIX  Take by mouth 2 (two) times daily. 60 mg q am and 40 mg q pm     GOODY HEADACHE PO  as needed.     omeprazole 20 MG capsule  Commonly known as:  PRILOSEC  Take 20 mg by mouth at bedtime.     oxyCODONE 5 MG immediate release tablet  Commonly known as:  Oxy IR/ROXICODONE  Take 1 tablet (5 mg total) by mouth every 6 (six) hours as needed for severe pain.     potassium chloride SA 20 MEQ tablet    Commonly known as:  K-DUR,KLOR-CON  Take 20 mEq by mouth daily.     Tiotropium Bromide Monohydrate 2.5 MCG/ACT Aers  Commonly known as:  SPIRIVA RESPIMAT  Inhale 2 puffs into the lungs daily.     warfarin 5 MG tablet  Commonly known as:  COUMADIN  Take 1 tablet (5 mg total) by mouth one time only at 6 PM.        Disposition and follow-up:   Barry Lawrence was discharged from [facility] in Stable condition.  At the hospital follow up visit please address:  1.  SOB improved, signs/symptoms of infection. Follow up INR.   2.  Labs / imaging needed at time of follow-up: CXR, INR  3.  Pending labs/ test needing follow-up: Micro results  Follow-up Appointments:     Follow-up Information    Follow up with Barry Pollack, MD On 01/01/2015.   Specialty:  Cardiothoracic Surgery   Why:  Appointment is at 2:00   Contact information:   [place] [address] [phone]       Follow up with [facility] On 01/01/2015.   Why:  Please get CXR at 1:30 prior to appointment with Dr. Lyn Lawrence information:   [facility]       Follow up with Barry Seller, MD. Schedule an appointment as soon as possible for a visit in 1 month.   Specialty:  Family Medicine   Contact information:   [address] [place] [address] [phone]       Discharge Instructions: Discharge Instructions    Diet - low sodium heart healthy    Complete by:  As directed      Increase activity slowly    Complete by:  As directed            Consultations: Treatment Team:  Barry Pollack, MD  Procedures Performed:  Dg Chest 2 View  12/20/2014   CLINICAL DATA:  Status post empyema drainage 3 days ago  EXAM: CHEST  2 VIEW  COMPARISON:  AP portable chest x-ray of December 19, 2014  FINDINGS: The lungs are well-expanded. The right-sided chest tube is been removed. There is no pneumothorax. There remains pleural thickening-pleural fluid  along the lateral aspect of the right hemithorax, in the minor fissure, and at the right lung base. This is more conspicuous today. The left lung is hyperinflated. The interstitial markings of both lungs have improved. The cardiac silhouette remains enlarged. The pulmonary vascularity is less engorged. There is are 6 intact sternal wires. The bony thorax exhibits no acute abnormality.  IMPRESSION: Mild interval improvement in the appearance of the pulmonary interstitium consistent with resolving interstitial edema. There is persistent pleural fluid -pleural thickening on the right which is slightly more conspicuous today.   Electronically Signed   By: Barry  Lawrence M.D.   On: 12/20/2014 08:06   Dg Chest 2 View  12/14/2014   CLINICAL DATA:  Shortness of breath for 2 days. Status post thoracentesis 12/13/2014.  EXAM: CHEST  2 VIEW  COMPARISON:  Single view of the chest 12/13/2014. CT chest 12/12/2014.  FINDINGS: Moderate right pleural effusion and basilar airspace disease appear unchanged compared to yesterday's examination. The left lung is clear. There is cardiomegaly. The patient is status post CABG.  IMPRESSION: No change in a moderate right pleural effusion and basilar airspace disease.   Electronically Signed   By: Barry Lawrence M.D.   On: 12/14/2014 10:17   Dg Chest 2 View  11/29/2014   CLINICAL DATA:  Shortness of breath and chest pain.  EXAM: CHEST  2 VIEW  COMPARISON:  11/29/2014 and prior exams  FINDINGS: Cardiomegaly and CABG changes again noted.  Right lower lobe opacity again noted, best seen on the lateral view.  Mild interstitial prominence is present and could represent mild interstitial edema.  There may be tiny bilateral pleural effusions present.  There is no evidence of pneumothorax.  IMPRESSION: Right lower lobe opacity suspicious for pneumonia.  Cardiomegaly with mild interstitial opacities which could represent mild interstitial edema.  Question tiny bilateral pleural effusions.    Electronically Signed   By: Margarette Canada M.D.   On: 11/29/2014 20:02   Ct Chest Wo Contrast  12/12/2014   CLINICAL DATA:  Shortness of breath. Large right pleural effusion on chest radiographs earlier today. Recently treated with antibiotics for pneumonia with no symptomatic improvement.  EXAM: CT CHEST WITHOUT CONTRAST  TECHNIQUE: Multidetector CT imaging of the chest was performed following the standard protocol without IV contrast.  COMPARISON:  Chest radiographs obtained earlier today and chest CT dated 09/04/2014.  FINDINGS: Large right  pleural effusion. Consolidation and volume loss involving the right lower lobe and right middle lobe and, to a lesser degree, the right upper lobe. Clear left lung. Air bronchograms with no visible mass and no enlarged lymph nodes.  Atheromatous arterial calcifications, including the coronary arteries. Post CABG changes. The ascending thoracic aorta is currently just below the upper limit of normal with a transverse diameter of 3.9 cm on image number 22 at the level of the main pulmonary artery, previously 4.2 cm.  Gallstones in the gallbladder measuring up to 2.0 cm in maximum diameter each. No gallbladder wall thickening or pericholecystic fluid seen. No significant change in previously demonstrated bilateral fat density containing adrenal masses. The mass on the left currently measures 3.1 x 2.7 cm in the mass on the right currently measures 1.7 x 1.6 cm.  IMPRESSION: 1. Large right pleural effusion. 2. Compressive atelectasis and possible pneumonia involving the right lower lobe, right middle lobe and a portion of the right upper lobe. 3. No visible mass or adenopathy. 4. Cholelithiasis. 5. Stable bilateral adrenal myelolipomas.   Electronically Signed   By: Claudie Revering M.D.   On: 12/12/2014 18:06   Dg Chest Port 1 View  12/19/2014   CLINICAL DATA:  Follow-up of pleural effusion, status post empyema drainage on December 17 2014  EXAM: PORTABLE CHEST - 1 VIEW  COMPARISON:   Portable chest x-ray of December 18, 2014  FINDINGS: The lungs are adequately inflated. The right hemidiaphragm is slightly better demonstrated today. The interstitial markings remain increased bilaterally with patchy areas of alveolar airspace opacity. The right-sided chest tube is unchanged in position. There is no pneumothorax. The cardiac silhouette remains enlarged. The central pulmonary vascularity is mildly prominent. There are post CABG changes.  IMPRESSION: Allowing for differences in positioning and inflation there has not been significant interval change since yesterday's study. There is no pneumothorax nor significant pleural effusion.   Electronically Signed   By: Deangleo  Lawrence M.D.   On: 12/19/2014 08:13   Dg Chest Port 1 View  12/18/2014   CLINICAL DATA:  69 year old male with history of pneumothorax.  EXAM: PORTABLE CHEST - 1 VIEW  COMPARISON:  Chest x-ray 12/17/2014.  FINDINGS: Lung volumes are low. Bibasilar opacities may reflect areas of atelectasis and/or consolidation, likely with superimposed small bilateral pleural effusions. Right-sided chest tube in position with tip directed into the medial aspect of the upper right hemithorax. No appreciable pneumothorax identified at this time. Vascular crowding, without frank pulmonary edema. Mild cardiomegaly. Upper mediastinal contours are within normal limits allowing for patient's low lung volumes and slight patient rotation to the right. Atherosclerosis in the thoracic aorta. Status post median sternotomy for CABG.  IMPRESSION: 1. Right-sided chest tube remains in position. No right pneumothorax. 2. Low lung volumes with bibasilar areas of atelectasis and/or consolidation, likely with small bilateral pleural effusions. 3. Cardiomegaly with pulmonary venous congestion, but no frank pulmonary edema. 4. Atherosclerosis.   Electronically Signed   By: Vinnie Langton M.D.   On: 12/18/2014 08:59   Dg Chest Port 1 View  12/17/2014   CLINICAL DATA:   Postoperative right sided video-assisted thoracoscopic surgery with drainage  EXAM: PORTABLE CHEST - 1 VIEW  COMPARISON:  December 14, 2014  FINDINGS: There is a chest tube on the right. There has been significant drainage of fluid from the right lung. A small amount of residual pleural effusion remains with right base atelectasis.  There is mild interstitial edema with cardiomegaly. The pulmonary vascularity is  normal. No adenopathy. No bone lesions. Patient is status post coronary artery bypass grafting. There are foci of carotid artery calcification bilaterally.  IMPRESSION: Chest tube on the right, status post drainage of pleural effusion. There is a small residua right effusion with mild right base atelectasis. There is mild edema with cardiomegaly consistent with a mild degree of underlying congestive heart failure. There is currently no appreciable airspace consolidation.   Electronically Signed   By: Lowella Grip III M.D.   On: 12/17/2014 15:48   Dg Chest Port 1 View  12/13/2014   CLINICAL DATA:  Cough, shortness of breath, post thoracentesis, history hypertension, CHF, CABG, MI, chronic kidney disease  EXAM: PORTABLE CHEST - 1 VIEW  COMPARISON:  Portable exam 1609 hours compared to 12/12/2014  FINDINGS: Enlargement of cardiac silhouette post CABG.  Atherosclerotic calcification and elongation of thoracic aorta.  Pulmonary vascularity normal.  Decrease in RIGHT pleural effusion and basilar atelectasis post thoracentesis.  No pneumothorax.  LEFT lung clear.  Bones unremarkable.  IMPRESSION: Decrease in RIGHT pleural effusion and basilar atelectasis post thoracentesis without evidence of pneumothorax.  Enlargement of cardiac silhouette post CABG.   Electronically Signed   By: Lavonia Dana M.D.   On: 12/13/2014 16:20   Dg Chest Port 1 View  11/29/2014   CLINICAL DATA:  Shortness of Breath  EXAM: PORTABLE CHEST - 1 VIEW  COMPARISON:  10/06/2014 and 06/19/2014  FINDINGS: Cardiomegaly again noted. Status post  CABG. Mild interstitial prominence bilaterally suspicious for mild interstitial edema. Hazy right basilar atelectasis or infiltrate.  IMPRESSION: Mild interstitial prominence bilaterally suspicious for mild interstitial edema. Status post CABG. Cardiomegaly. Hazy right basilar atelectasis or infiltrate.   Electronically Signed   By: Lahoma Crocker M.D.   On: 11/29/2014 09:19   Dg Swallowing Func-speech Pathology  12/01/2014    Objective Swallowing Evaluation:    Patient Details  Name: YONAS HOVDE MRN: [id number] Date of Birth: [birth year]  Today's Date: 12/01/2014 Time: SLP Start Time (ACUTE ONLY): 1200-SLP Stop Time (ACUTE ONLY): 1215 SLP Time Calculation (min) (ACUTE ONLY): 15 min  Past Medical History:  Past Medical History  Diagnosis Date  . CAD (coronary artery disease)     myoview 04/21/11-normal, EF49%  . S/P CABG (coronary artery bypass graft) 1996  . Hyperlipemia   . Polycythemia     H/O  . GERD (gastroesophageal reflux disease)   . Tobacco abuse   . CKD (chronic kidney disease), stage III   . Myocardial infarction 1996  . OSA on CPAP     CPAP q night , CPAP is through [place], states doesn't remember when he had  the last study  . Pneumonia     hosp. as a child with pneumonia, not since   . Diabetes     BORDERLINE  . Headache     uses [employer], states he is lessening what he is taking for prep.  for surgery   . Arthritis     in his back  . Hypertension     stress test- done 04/2014, followed by Dr. Gwenlyn Found  . Carotid stenosis 01/15/08    right endarterectomy-Dr. Amedeo Plenty  . PAD (peripheral artery disease) 06/12/09    a. 11/22/07 PTA & stenting right external iliac artery;bilateral iliac &  PTI and stenting 1997;right ICA =>50% reduction,right SFA >50%,left ICA at  stent => 50% reduction,left EIA => 50% reduction,left ATA occluded, left  SFA mid > 49% reduction;  03/2014 Angio: LRA 90, patent L Iliac stent and  distal RCIA stent, large saccular AAA.  [name] Abdominal aortic aneurysm 01/28/10; 02/05/14    4.3x4.6cm;  now  measuring 5.5 cm, status post aortobifemoral bypass  grafting in January 2016 by Dr. problem along with left renal artery  bypass   Past Surgical History:  Past Surgical History  Procedure Laterality Date  . Carotid endarterectomy Right 01/15/08  . Lumbar disc surgery  1998  . Pv angio  11/22/2007    PTA/ stenting of right common iliac artery with a 10x72mm Smart stent  and postdilated with a 7x51mmpowerflex balloon; high grade calcified  stenosis of the RICA  . Colonoscopy N/A 07/09/2013    Procedure: COLONOSCOPY;  Surgeon: Juanita Craver, MD;  Location: [facility]  ENDOSCOPY;  Service: Endoscopy;  Laterality: N/A;  . Pv angio  04/01/14    AAA, Lt renal artery stenosis, patent iliacs  . Abdominal aortagram N/A 04/01/2014    Procedure: ABDOMINAL Maxcine Ham;  Surgeon: [name], MD;   Location: [facility] CATH LAB;  Service: Cardiovascular;  Laterality: N/A;  . Back surgery  1988    at [facility]  . Coronary artery bypass graft  1996    LIMA to LAD, sequential vein to OM1 and 2 as well as acure marginal  branch and diag branch  . Aorta - bilateral femoral artery bypass graft N/A 05/30/2014    Procedure: AORTOBIFEMORAL BYPASS GRAFT;  Surgeon: Serafina Mitchell, MD;   Location: [facility];  Service: Vascular;  Laterality: N/A;  . Aortic/renal bypass Left 05/30/2014    Procedure: LEFT RENAL ARTERY BYPASS;  Surgeon: Serafina Mitchell, MD;   Location: [facility] OR;  Service: Vascular;  Laterality: Left;   HPI:  Other Pertinent Information: Pt is a 69 y/o male with PMH of CKD stage 4,  CAD, PAD, AAA, s/p aortobifemoral bypass in Jan 2016 who p/w SOB * 1 day.  Patient states that he was feeling nauseous and slightly unwell the day  PTA and woke up yesterday with SOB. It was assoc with b/l flank pain worse  on the right and chills. Pain was worse on coughing and taking a deep  breath. Chest x ray 11-29-14 showed right lower lobe opacity suspicious for  pneumonia.   No Data Recorded  Assessment / Plan / Recommendation CHL IP CLINICAL IMPRESSIONS 12/01/2014  Therapy  Diagnosis Suspected primary esophageal dysphagia  Clinical Impression Pt demonstrates adequate oral and oropharyngeal  function with no penetration or aspiration observed. Esophageal sweep  revealed appearance of stasis in the mid to distal esophagus, slowly  clearing with thin liquids (no radiologist present to confirm). Discussed  basic esophageal precautions to reduce risk of postprandial aspiration. No  SLP f/u needed, continue current diet and sign off.       CHL IP TREATMENT RECOMMENDATION 12/01/2014  Treatment Recommendations No treatment recommended at this time     CHL IP DIET RECOMMENDATION 12/01/2014  SLP Diet Recommendations Thin;Age appropriate regular solids  Liquid Administration via (None)  Medication Administration Whole meds with liquid  Compensations Slow rate;Small sips/bites;Follow solids with liquid  Postural Changes and/or Swallow Maneuvers (None)     CHL IP OTHER RECOMMENDATIONS 12/01/2014  Recommended Consults (None)  Oral Care Recommendations Oral care BID  Other Recommendations (None)     CHL IP FOLLOW UP RECOMMENDATIONS 06/28/2014  Follow up Recommendations None     CHL IP FREQUENCY AND DURATION 11/30/2014  Speech Therapy Frequency (ACUTE ONLY) min 1 x/week  Treatment Duration 1 week     Pertinent Vitals/Pain NA  Herbie Baltimore, [place] [phone]  Lynann Beaver 12/01/2014, 12:41 PM    Admission HPI: VIAN PETTITT is a 69 year old male with PMH significant for CKD Stage 4, A fib, CAD, PAD, AAA s/p aortobifemoral bypass in Jan 2016. He was recently admitted to [facility] from 7/8-7/12 for treatment of right-sided pneumonia and Klebsiella pneumonia bacteremia sensitive to CTX. He was discharged on 3L [place] and Keflex, which he finished on 7/20. Post-discharge f/u CXR at his PCP 7/21 noted a large right pleural effusion. He reports no improvement in his overall feeling, and worsening SOB. He was satting 90% on RA. He reports satting 93% on RA since discharge. He endorses the inability to  lie flat, which is common for him. He continues to endorse weakness, fatigue, and nausea. He endorses cough productive of white sputum. He denies changes in leg swelling, fever, chills, chest pain, vomiting, constipation, diarrhea, or night sweats.   He is a 1 ppd smoker for >60 years.  Hospital Course by problem list: Principal Problem:   Parapneumonic effusion Active Problems:   CAD (coronary artery disease)   Carotid artery disease   Hypertensive heart disease   GERD (gastroesophageal reflux disease)   Abdominal aortic aneurysm without rupture   COPD GOLD II still smoking    Cigarette smoker   CKD (chronic kidney disease), stage IV   PAD (peripheral artery disease)   Sleep apnea   Atrial fibrillation   Chronic diastolic CHF (congestive heart failure)   Pleural effusion   Pleural effusion: His previous PNA with blood cultures grew Klebsiella sensitive to CTX, was treated with IV ABX previous admission and sent home on 7 day course of Keflex. WBC at admission was 10.9 (from 12.3 on 7/11). Patient finished antibiotic course that should have treated the PNA.Though effusion was believed to likely be parapneumonic, could not rule out empyema, malignant effusion (60+ pack-year history), or effusion 2/2 CHF (although unilateral). Antibiotics were initially deferred, pending a thoracentesis. His coumadin was held in anticipation of thoracentesis. PCCM was consulted and they tapped the effusion on 7/22. 1500 ml of bloody pleural fluid was obtained. A sample was sent to Pathology for cytogenetics, flow, and cell counts, as well as for infection analysis. All cultures were negative to date, fluid analysis showed exudative fluid likely 2/2 parapneumonic. He reported some symptomatic relief following the tap but overnight developed chills but was afebrile. Started IV rocephin prophylactically at that time though blood cultures were not drawn. Repeat CXR showed stable right pleural effusion. His  dyspnea did not improve though the chills subsided. Consulted cardiothoracic surgery for possible VATS. Dr. Cyndia Bent believed him to have moderate residual effusion s/p thoracentesis which was likely loculated and believed he needed surgical drainage via VATS. His coumadin was held. VATS was done on 7/26 with 2L of old, thin, bloody fluid removed. A sample was sent for culture and cytology. There was some exudate around the base of the lower lobe that was removed but no pleural peel that required decortication. Following the procedure his symptoms greatly improved, only mild tenderness at the surgery site. Chest tube was removed on 7/28. Cultures grew nothing to date. He was still requiring 4L O2 at time of discharge, had O2 at home from previous admission and said he would use that. Surgery wanted follow up in 2 weeks and another week of ABX treatment. He finished a 7 day course of Rocephin and we discharged him on an additional 7 days of Keflex.   Chronic  diastolic CHF (congestive heart failure): He was on Lasix 60 mg PO qAM and 40qPM at home. Leg edema was improved from previous admission. Changed him to IV Lasix, Cr tolerated it well. Cr did jump up following VAT procedure but was trending down again prior to discharge. Continued his home Amlodipine 10 mg daily. Discharged on home meds.   AKI on CKD (chronic kidney disease), stage IV: SCr at 2.78 on admission from 2.35 on 7/11. Changed him to IV Lasix, Cr tolerated it well and trended down. Cr did jump up following VAT procedure but was trending down again prior to discharge. Continued his home Amlodipine 10 mg daily. Discharged on home meds.   Carotid artery disease- s/p CABG: He had no active chest pain, initial troponin negative, EKG without any signs of new ischemia. Continued home medications, did not suspect ACS. Atorva 80mg  and Amlodipine 10mg   Atrial fibrillation: INR 2.65 on admission. Rate controlled on Bisoprolol at home. CHADSVasc at least 4.  Stopped coumadin in setting of thoracentesis and VATS. Restarted prior to discharge, INR 1.32 on discharge. Continued home bisoprolol.   COPD GOLD II still smoking. Continued home [place] with albuterol prn. He required 2L at rest and 4L with exertion prior to discharge. Sent home with supplemental O2. - Goal O2 sat 89-94% - Albuterol PRN  Cigarette smoker: RN provided smoking cessation education.  Sleep apnea: He refused CPAP during hospital stay.  Discharge Vitals:   BP 126/71 mmHg  Pulse 44  Temp(Src) 98.9 F (37.2 C) (Oral)  Resp 18  Ht 5\' 7"  (1.702 m)  Wt 82 kg (180 lb 12.4 oz)  BMI 28.31 kg/m2  SpO2 95%  Discharge Labs:  No results found for this or any previous visit (from the past 24 hour(s)).  Signed: Maryellen Pile, MD 12/22/2014, 12:37 PM

## 2014-12-30 ENCOUNTER — Ambulatory Visit (INDEPENDENT_AMBULATORY_CARE_PROVIDER_SITE_OTHER): Payer: Medicare Other | Admitting: *Deleted

## 2014-12-30 ENCOUNTER — Other Ambulatory Visit: Payer: Self-pay | Admitting: Surgery

## 2014-12-30 DIAGNOSIS — I4891 Unspecified atrial fibrillation: Secondary | ICD-10-CM

## 2014-12-30 DIAGNOSIS — J90 Pleural effusion, not elsewhere classified: Secondary | ICD-10-CM

## 2014-12-30 DIAGNOSIS — Z7901 Long term (current) use of anticoagulants: Secondary | ICD-10-CM | POA: Diagnosis not present

## 2014-12-30 DIAGNOSIS — I480 Paroxysmal atrial fibrillation: Secondary | ICD-10-CM

## 2014-12-30 LAB — POCT INR: INR: 2.2

## 2015-01-01 ENCOUNTER — Ambulatory Visit: Payer: Non-veteran care | Admitting: Surgery

## 2015-01-02 ENCOUNTER — Encounter: Payer: Self-pay | Admitting: Surgery

## 2015-01-02 ENCOUNTER — Ambulatory Visit
Admission: RE | Admit: 2015-01-02 | Discharge: 2015-01-02 | Disposition: A | Discharge status: Home / Self Care | Payer: Medicare Other | Source: Ambulatory Visit | Attending: Surgery | Admitting: Surgery

## 2015-01-02 ENCOUNTER — Ambulatory Visit (INDEPENDENT_AMBULATORY_CARE_PROVIDER_SITE_OTHER): Payer: Self-pay | Admitting: Surgery

## 2015-01-02 VITALS — BP 140/74 | HR 55 | Resp 20 | Ht 67.0 in | Wt 180.0 lb

## 2015-01-02 DIAGNOSIS — J90 Pleural effusion, not elsewhere classified: Secondary | ICD-10-CM

## 2015-01-02 DIAGNOSIS — J948 Other specified pleural conditions: Secondary | ICD-10-CM

## 2015-01-02 NOTE — Progress Notes (Signed)
     HPI: Patient returns for routine postoperative follow-up having undergone right VATS with drainage of a large pleural effusion on 12/17/2014. It was felt that the etiology was likely a combination of chronic diastolic heart failure, stage 4 CKD and possibly some pneumonia with parapneumonic effusion.  The patient's early postoperative recovery while in the hospital was notable for an uncomplicated postop course. Since hospital discharge the patient reports that he has been feeling better. He has been walking without shortness of breath. .   Current Outpatient Prescriptions  Medication Sig Dispense Refill  . albuterol (PROVENTIL HFA;VENTOLIN HFA) 108 (90 BASE) MCG/ACT inhaler Inhale 2 puffs into the lungs every 6 (six) hours as needed for wheezing or shortness of breath. 1 Inhaler 2  . amLODipine (NORVASC) 10 MG tablet Take 10 mg by mouth daily before breakfast.     . atorvastatin (LIPITOR) 80 MG tablet Take 80 mg by mouth at bedtime.     . bisoprolol (ZEBETA) 5 MG tablet Take 5 mg by mouth daily.  0  . furosemide (LASIX) 80 MG tablet Take 80 mg by mouth 2 (two) times daily.    [name] omeprazole (PRILOSEC) 20 MG capsule Take 20 mg by mouth at bedtime.     [name] oxyCODONE (OXY IR/ROXICODONE) 5 MG immediate release tablet Take 1 tablet (5 mg total) by mouth every 6 (six) hours as needed for severe pain. 30 tablet 0  . potassium chloride SA (K-DUR,KLOR-CON) 20 MEQ tablet Take 20 mEq by mouth daily.    [name] warfarin (COUMADIN) 5 MG tablet Take 1 tablet (5 mg total) by mouth one time only at 6 PM. 30 tablet 3   No current facility-administered medications for this visit.    Physical Exam: BP 140/74 mmHg  Pulse 55  Resp 20  Ht 5\' 7"  (1.702 m)  Wt 180 lb (81.647 kg)  BMI 28.19 kg/m2  SpO2 97% He looks well Lung exam reveals a few crackles at the right base The right chest incisions are healing well. I removed the remaining chest tube suture. There is mild bilateral lower leg edema  Diagnostic  Tests:  CLINICAL DATA: Some shortness of breath. History of pleural effusion.  EXAM: CHEST 2 VIEW  COMPARISON: 12/20/2014  FINDINGS: Changes from CABG surgery are stable. Cardiac silhouette is mildly enlarged. No mediastinal or hilar masses or evidence of adenopathy.  Lungs are mildly hyperexpanded. There is some opacity in the right lung base blunting the right lateral costophrenic sulcus. This reflects a combination of a small residual pleural effusion and atelectasis. There is no pulmonary edema or convincing pneumonia. No pneumothorax.  Bony thorax is demineralized but grossly intact.  IMPRESSION: 1. Continued improvement from the prior study. 2. There is now only a small residual right effusion with associated right lung base atelectasis. No pulmonary edema. No convincing pneumonia.   Electronically Signed  By: Lajean Manes M.D.  On: 01/02/2015 14:46   Impression:  Overall I think he is doing well. His CXR has continued to improve and he is symptomatically much better. I encouraged him to continue taking his medications, watch his diet and sodium intake and to use his incentive spirometer and ambulate.  Plan:  He will follow up with Dr. Kathryne Eriksson.   Gaye Pollack, MD [facility] [phone]

## 2015-01-13 LAB — FUNGUS CULTURE W SMEAR: Fungal Smear: NONE SEEN

## 2015-01-14 LAB — FUNGUS CULTURE W SMEAR: Fungal Smear: NONE SEEN

## 2015-01-20 ENCOUNTER — Ambulatory Visit (INDEPENDENT_AMBULATORY_CARE_PROVIDER_SITE_OTHER): Payer: Medicare Other | Admitting: Pharmacist

## 2015-01-20 DIAGNOSIS — I4891 Unspecified atrial fibrillation: Secondary | ICD-10-CM

## 2015-01-20 DIAGNOSIS — I480 Paroxysmal atrial fibrillation: Secondary | ICD-10-CM | POA: Diagnosis not present

## 2015-01-20 DIAGNOSIS — Z7901 Long term (current) use of anticoagulants: Secondary | ICD-10-CM | POA: Diagnosis not present

## 2015-01-20 LAB — POCT INR: INR: 2.4

## 2015-01-25 LAB — AFB CULTURE WITH SMEAR (NOT AT ARMC): Acid Fast Smear: NONE SEEN

## 2015-01-29 LAB — AFB CULTURE WITH SMEAR (NOT AT ARMC)
Acid Fast Smear: NONE SEEN
Acid Fast Smear: NONE SEEN

## 2015-02-17 ENCOUNTER — Ambulatory Visit (INDEPENDENT_AMBULATORY_CARE_PROVIDER_SITE_OTHER): Payer: Medicare Other | Admitting: *Deleted

## 2015-02-17 DIAGNOSIS — I4891 Unspecified atrial fibrillation: Secondary | ICD-10-CM | POA: Diagnosis not present

## 2015-02-17 DIAGNOSIS — Z7901 Long term (current) use of anticoagulants: Secondary | ICD-10-CM

## 2015-02-17 DIAGNOSIS — I480 Paroxysmal atrial fibrillation: Secondary | ICD-10-CM | POA: Diagnosis not present

## 2015-02-17 LAB — POCT INR: INR: 2.6

## 2015-03-17 ENCOUNTER — Ambulatory Visit (INDEPENDENT_AMBULATORY_CARE_PROVIDER_SITE_OTHER): Payer: Medicare Other | Admitting: *Deleted

## 2015-03-17 DIAGNOSIS — Z7901 Long term (current) use of anticoagulants: Secondary | ICD-10-CM | POA: Diagnosis not present

## 2015-03-17 DIAGNOSIS — I480 Paroxysmal atrial fibrillation: Secondary | ICD-10-CM | POA: Diagnosis not present

## 2015-03-17 DIAGNOSIS — I4891 Unspecified atrial fibrillation: Secondary | ICD-10-CM | POA: Diagnosis not present

## 2015-03-17 LAB — POCT INR: INR: 2.6

## 2015-04-14 ENCOUNTER — Ambulatory Visit (INDEPENDENT_AMBULATORY_CARE_PROVIDER_SITE_OTHER): Payer: Medicare Other | Admitting: *Deleted

## 2015-04-14 DIAGNOSIS — Z7901 Long term (current) use of anticoagulants: Secondary | ICD-10-CM | POA: Diagnosis not present

## 2015-04-14 DIAGNOSIS — I480 Paroxysmal atrial fibrillation: Secondary | ICD-10-CM

## 2015-04-14 DIAGNOSIS — I4891 Unspecified atrial fibrillation: Secondary | ICD-10-CM

## 2015-04-14 LAB — POCT INR: INR: 2.2

## 2015-04-28 ENCOUNTER — Other Ambulatory Visit: Payer: Self-pay | Admitting: *Deleted

## 2015-04-29 MED ORDER — WARFARIN SODIUM 5 MG PO TABS: 5.0000 mg | ORAL_TABLET | Freq: Once | ORAL | Status: DC

## 2015-05-12 ENCOUNTER — Ambulatory Visit (INDEPENDENT_AMBULATORY_CARE_PROVIDER_SITE_OTHER): Payer: Medicare Other | Admitting: *Deleted

## 2015-05-12 DIAGNOSIS — Z7901 Long term (current) use of anticoagulants: Secondary | ICD-10-CM | POA: Diagnosis not present

## 2015-05-12 DIAGNOSIS — I482 Chronic atrial fibrillation: Secondary | ICD-10-CM

## 2015-05-12 DIAGNOSIS — I4821 Permanent atrial fibrillation: Secondary | ICD-10-CM

## 2015-05-12 DIAGNOSIS — I480 Paroxysmal atrial fibrillation: Secondary | ICD-10-CM | POA: Diagnosis not present

## 2015-05-12 LAB — POCT INR: INR: 2.1

## 2015-06-09 ENCOUNTER — Ambulatory Visit (INDEPENDENT_AMBULATORY_CARE_PROVIDER_SITE_OTHER): Payer: Medicare Other | Admitting: *Deleted

## 2015-06-09 DIAGNOSIS — I480 Paroxysmal atrial fibrillation: Secondary | ICD-10-CM

## 2015-06-09 DIAGNOSIS — I4821 Permanent atrial fibrillation: Secondary | ICD-10-CM

## 2015-06-09 DIAGNOSIS — I482 Chronic atrial fibrillation: Secondary | ICD-10-CM | POA: Diagnosis not present

## 2015-06-09 DIAGNOSIS — Z7901 Long term (current) use of anticoagulants: Secondary | ICD-10-CM

## 2015-06-09 LAB — POCT INR: INR: 1.6

## 2015-06-18 ENCOUNTER — Telehealth: Payer: Self-pay | Admitting: Cardiovascular Disease

## 2015-06-18 NOTE — Telephone Encounter (Signed)
Pharmacy called, pt switched pharmacy, they do not carry pt's previous brand of warfarin. I gave verification it was OK to switch patient's warfarin from [facility] to [facility]

## 2015-06-23 ENCOUNTER — Ambulatory Visit (INDEPENDENT_AMBULATORY_CARE_PROVIDER_SITE_OTHER): Payer: Medicare Other | Admitting: *Deleted

## 2015-06-23 DIAGNOSIS — Z7901 Long term (current) use of anticoagulants: Secondary | ICD-10-CM | POA: Diagnosis not present

## 2015-06-23 DIAGNOSIS — I480 Paroxysmal atrial fibrillation: Secondary | ICD-10-CM

## 2015-06-23 DIAGNOSIS — I4891 Unspecified atrial fibrillation: Secondary | ICD-10-CM | POA: Diagnosis not present

## 2015-06-23 LAB — POCT INR: INR: 2.5

## 2015-07-21 ENCOUNTER — Ambulatory Visit (INDEPENDENT_AMBULATORY_CARE_PROVIDER_SITE_OTHER): Payer: Medicare Other | Admitting: *Deleted

## 2015-07-21 DIAGNOSIS — I4891 Unspecified atrial fibrillation: Secondary | ICD-10-CM

## 2015-07-21 DIAGNOSIS — Z7901 Long term (current) use of anticoagulants: Secondary | ICD-10-CM

## 2015-07-21 LAB — POCT INR: INR: 3.7

## 2015-08-11 ENCOUNTER — Ambulatory Visit (INDEPENDENT_AMBULATORY_CARE_PROVIDER_SITE_OTHER): Payer: Medicare Other | Admitting: *Deleted

## 2015-08-11 DIAGNOSIS — I480 Paroxysmal atrial fibrillation: Secondary | ICD-10-CM | POA: Diagnosis not present

## 2015-08-11 DIAGNOSIS — Z7901 Long term (current) use of anticoagulants: Secondary | ICD-10-CM | POA: Diagnosis not present

## 2015-08-11 DIAGNOSIS — I4891 Unspecified atrial fibrillation: Secondary | ICD-10-CM | POA: Diagnosis not present

## 2015-08-11 LAB — POCT INR: INR: 2.8

## 2015-08-22 ENCOUNTER — Telehealth: Payer: Self-pay | Admitting: *Deleted

## 2015-08-22 DIAGNOSIS — R911 Solitary pulmonary nodule: Secondary | ICD-10-CM

## 2015-08-22 NOTE — Telephone Encounter (Signed)
ATC, NA and no option to leave msg, WCB

## 2015-08-22 NOTE — Telephone Encounter (Signed)
-----   Message from Tanda Rockers, MD sent at 07/15/2015  1:08 PM EST ----- rec repeat 09/04/15  (the one he had in July was not adequate) ----- Message -----    From: Rosana Berger, CMA    Sent: 07/15/2015  12:10 PM      To: Tanda Rockers, MD  Pt had ct chest done July 2016- should we repeat scan in July 2017? ----- Message -----    From: Rosana Berger, CMA    Sent: 09/04/2014  11:24 AM      To: Rosana Berger, CMA    ----- Message -----    From: Tanda Rockers, MD    Sent: 09/04/2014  10:46 AM      To: Rosana Berger, CMA  Ct s contrast due f/u spn

## 2015-08-26 NOTE — Telephone Encounter (Signed)
Spoke with the pt and notified of due for CT Chest f/u pulmonary nodule  He verbalized understanding and order sent to [facility]

## 2015-09-04 ENCOUNTER — Ambulatory Visit (HOSPITAL_COMMUNITY)
Admission: RE | Admit: 2015-09-04 | Discharge: 2015-09-04 | Disposition: A | Discharge status: Home / Self Care | Payer: Medicare Other | Source: Ambulatory Visit | Attending: Internal Medicine | Admitting: Internal Medicine

## 2015-09-04 DIAGNOSIS — R911 Solitary pulmonary nodule: Secondary | ICD-10-CM

## 2015-09-04 DIAGNOSIS — K8020 Calculus of gallbladder without cholecystitis without obstruction: Secondary | ICD-10-CM | POA: Diagnosis not present

## 2015-09-04 DIAGNOSIS — F17200 Nicotine dependence, unspecified, uncomplicated: Secondary | ICD-10-CM | POA: Insufficient documentation

## 2015-09-04 DIAGNOSIS — I712 Thoracic aortic aneurysm, without rupture: Secondary | ICD-10-CM | POA: Diagnosis not present

## 2015-09-04 DIAGNOSIS — D1779 Benign lipomatous neoplasm of other sites: Secondary | ICD-10-CM | POA: Diagnosis not present

## 2015-09-04 DIAGNOSIS — R918 Other nonspecific abnormal finding of lung field: Secondary | ICD-10-CM | POA: Insufficient documentation

## 2015-09-04 NOTE — Progress Notes (Signed)
Quick Note:  Spoke with pt and notified of results per Dr. Wert. Pt verbalized understanding and denied any questions.  ______ 

## 2015-09-08 ENCOUNTER — Ambulatory Visit (INDEPENDENT_AMBULATORY_CARE_PROVIDER_SITE_OTHER): Payer: Medicare Other | Admitting: *Deleted

## 2015-09-08 DIAGNOSIS — I480 Paroxysmal atrial fibrillation: Secondary | ICD-10-CM | POA: Diagnosis not present

## 2015-09-08 DIAGNOSIS — Z7901 Long term (current) use of anticoagulants: Secondary | ICD-10-CM | POA: Diagnosis not present

## 2015-09-08 DIAGNOSIS — I4891 Unspecified atrial fibrillation: Secondary | ICD-10-CM

## 2015-09-08 LAB — POCT INR: INR: 2.6

## 2015-09-15 ENCOUNTER — Other Ambulatory Visit: Payer: Self-pay | Admitting: Cardiovascular Disease

## 2015-10-06 ENCOUNTER — Ambulatory Visit (INDEPENDENT_AMBULATORY_CARE_PROVIDER_SITE_OTHER): Payer: Medicare Other | Admitting: *Deleted

## 2015-10-06 DIAGNOSIS — I480 Paroxysmal atrial fibrillation: Secondary | ICD-10-CM | POA: Diagnosis not present

## 2015-10-06 DIAGNOSIS — I4891 Unspecified atrial fibrillation: Secondary | ICD-10-CM

## 2015-10-06 DIAGNOSIS — Z7901 Long term (current) use of anticoagulants: Secondary | ICD-10-CM

## 2015-10-06 LAB — POCT INR: INR: 3.1

## 2015-10-06 MED ORDER — WARFARIN SODIUM 5 MG PO TABS: 5.0000 mg | ORAL_TABLET | Freq: Every day | ORAL | Status: DC

## 2015-11-03 ENCOUNTER — Ambulatory Visit (INDEPENDENT_AMBULATORY_CARE_PROVIDER_SITE_OTHER): Payer: Medicare Other | Admitting: *Deleted

## 2015-11-03 DIAGNOSIS — I480 Paroxysmal atrial fibrillation: Secondary | ICD-10-CM

## 2015-11-03 DIAGNOSIS — Z7901 Long term (current) use of anticoagulants: Secondary | ICD-10-CM

## 2015-11-03 DIAGNOSIS — I4891 Unspecified atrial fibrillation: Secondary | ICD-10-CM

## 2015-11-03 LAB — POCT INR: INR: 3.2

## 2015-11-04 DIAGNOSIS — I10 Essential (primary) hypertension: Secondary | ICD-10-CM | POA: Diagnosis not present

## 2015-11-04 DIAGNOSIS — E7800 Pure hypercholesterolemia, unspecified: Secondary | ICD-10-CM | POA: Diagnosis not present

## 2015-11-10 DIAGNOSIS — E7800 Pure hypercholesterolemia, unspecified: Secondary | ICD-10-CM | POA: Diagnosis not present

## 2015-11-10 DIAGNOSIS — I129 Hypertensive chronic kidney disease with stage 1 through stage 4 chronic kidney disease, or unspecified chronic kidney disease: Secondary | ICD-10-CM | POA: Diagnosis not present

## 2015-11-10 DIAGNOSIS — I482 Chronic atrial fibrillation: Secondary | ICD-10-CM | POA: Diagnosis not present

## 2015-11-10 DIAGNOSIS — I739 Peripheral vascular disease, unspecified: Secondary | ICD-10-CM | POA: Diagnosis not present

## 2015-11-10 DIAGNOSIS — J42 Unspecified chronic bronchitis: Secondary | ICD-10-CM | POA: Diagnosis not present

## 2015-11-10 DIAGNOSIS — N184 Chronic kidney disease, stage 4 (severe): Secondary | ICD-10-CM | POA: Diagnosis not present

## 2015-11-19 ENCOUNTER — Encounter: Payer: Self-pay | Admitting: Family

## 2015-12-01 ENCOUNTER — Encounter: Payer: Self-pay | Admitting: Family

## 2015-12-01 ENCOUNTER — Ambulatory Visit (INDEPENDENT_AMBULATORY_CARE_PROVIDER_SITE_OTHER): Payer: Medicare Other | Admitting: Family

## 2015-12-01 ENCOUNTER — Ambulatory Visit (HOSPITAL_COMMUNITY)
Admission: RE | Admit: 2015-12-01 | Discharge: 2015-12-01 | Disposition: A | Discharge status: Home / Self Care | Payer: Medicare Other | Source: Ambulatory Visit | Attending: Surgery | Admitting: Surgery

## 2015-12-01 ENCOUNTER — Ambulatory Visit (INDEPENDENT_AMBULATORY_CARE_PROVIDER_SITE_OTHER)
Admission: RE | Admit: 2015-12-01 | Discharge: 2015-12-01 | Disposition: A | Discharge status: Home / Self Care | Payer: Medicare Other | Source: Ambulatory Visit | Attending: Surgery | Admitting: Surgery

## 2015-12-01 VITALS — BP 149/65 | HR 50 | Temp 97.1°F | Resp 16 | Ht 67.0 in | Wt 195.0 lb

## 2015-12-01 DIAGNOSIS — R0989 Other specified symptoms and signs involving the circulatory and respiratory systems: Secondary | ICD-10-CM | POA: Diagnosis present

## 2015-12-01 DIAGNOSIS — E1122 Type 2 diabetes mellitus with diabetic chronic kidney disease: Secondary | ICD-10-CM | POA: Diagnosis not present

## 2015-12-01 DIAGNOSIS — I701 Atherosclerosis of renal artery: Secondary | ICD-10-CM

## 2015-12-01 DIAGNOSIS — G4733 Obstructive sleep apnea (adult) (pediatric): Secondary | ICD-10-CM | POA: Insufficient documentation

## 2015-12-01 DIAGNOSIS — Z720 Tobacco use: Secondary | ICD-10-CM

## 2015-12-01 DIAGNOSIS — K219 Gastro-esophageal reflux disease without esophagitis: Secondary | ICD-10-CM | POA: Insufficient documentation

## 2015-12-01 DIAGNOSIS — Z95828 Presence of other vascular implants and grafts: Secondary | ICD-10-CM

## 2015-12-01 DIAGNOSIS — Z48812 Encounter for surgical aftercare following surgery on the circulatory system: Secondary | ICD-10-CM

## 2015-12-01 DIAGNOSIS — I2510 Atherosclerotic heart disease of native coronary artery without angina pectoris: Secondary | ICD-10-CM | POA: Insufficient documentation

## 2015-12-01 DIAGNOSIS — I714 Abdominal aortic aneurysm, without rupture, unspecified: Secondary | ICD-10-CM

## 2015-12-01 DIAGNOSIS — N183 Chronic kidney disease, stage 3 (moderate): Secondary | ICD-10-CM | POA: Insufficient documentation

## 2015-12-01 DIAGNOSIS — I129 Hypertensive chronic kidney disease with stage 1 through stage 4 chronic kidney disease, or unspecified chronic kidney disease: Secondary | ICD-10-CM | POA: Diagnosis not present

## 2015-12-01 DIAGNOSIS — F17200 Nicotine dependence, unspecified, uncomplicated: Secondary | ICD-10-CM

## 2015-12-01 DIAGNOSIS — E785 Hyperlipidemia, unspecified: Secondary | ICD-10-CM | POA: Insufficient documentation

## 2015-12-01 NOTE — Progress Notes (Signed)
[facility] [place] HISTORY AND PHYSICAL   MRN : [id number]  History of Present Illness:   Barry Lawrence is a 70 y.o. male patient of Dr. [name] is back for follow-up. He is status post aortobifemoral bypass with 16x8 graft and left renal artery bypass with 33mm graft on 05/30/14. This was done for claudication and an AAA as well as renal artery stenosis. He was not a stent graft candidate secondary to previously placed iliac stents which were too small to advance a graft through. He had a prolonged hospital course complicated by respiratory failure requiring intubation for approximately 2 weeks. He also suffered acute renal failure which required dialysis for several days. His renal function recovered to where his creatinine returned to near baseline around 2.  He had a separation of his groin incisions which have now healed. Unfortunately, he has resumed smoking. He had a right arm AV fistula placed at the [place] which is not yet in use.Pt states "I am fighting it", he refers to starting hemodialysis.  He is having increased fluid buildup in his legs which has somewhat responded to Lasix.   He denies claudication in is legs with walking. He denies abdominal pain, denies back pain.  He had a right CEA in 2009 by Dr. Amedeo Plenty. May of 2016 carotid duplex (review of records) shows a patent right CEA with no restenosis and minimal left ICA stenosis; this was requested by Dr. Gwenlyn Found.  He denies any history of stroke or TIA. He had one MI in 1996, CABG x 6 vessels at that time.  Pt Diabetic: No Pt smoker: smoker  (1 ppd, started at 12 yrs)  Pt meds include: Statin :Yes Betablocker: Yes ASA: No Other anticoagulants/antiplatelets: coumadin for hx of atrial fib   Current Outpatient Prescriptions  Medication Sig Dispense Refill  . albuterol (PROVENTIL HFA;VENTOLIN HFA) 108 (90 BASE) MCG/ACT inhaler Inhale 2 puffs into the lungs every 6 (six) hours as needed for wheezing  or shortness of breath. 1 Inhaler 2  . amLODipine (NORVASC) 10 MG tablet Take 5 mg by mouth daily before breakfast. 1/2 tab    . atorvastatin (LIPITOR) 80 MG tablet Take 80 mg by mouth at bedtime.     . bisoprolol (ZEBETA) 5 MG tablet Take 5 mg by mouth daily.  0  . furosemide (LASIX) 40 MG tablet Take 40 mg by mouth 2 (two) times daily.    [name] omeprazole (PRILOSEC) 20 MG capsule Take 20 mg by mouth at bedtime.     . potassium chloride SA (K-DUR,KLOR-CON) 20 MEQ tablet Take 20 mEq by mouth daily.    . tamsulosin (FLOMAX) 0.4 MG CAPS capsule Take 0.4 mg by mouth as directed. Take 2 tablets daily at night    . warfarin (COUMADIN) 5 MG tablet Take 1 tablet (5 mg total) by mouth daily at 6 PM. (Patient taking differently: Take 5 mg by mouth daily at 6 PM. As directed) 30 tablet 4  . oxyCODONE (OXY IR/ROXICODONE) 5 MG immediate release tablet Take 1 tablet (5 mg total) by mouth every 6 (six) hours as needed for severe pain. (Patient not taking: Reported on 12/01/2015) 30 tablet 0   No current facility-administered medications for this visit.    Past Medical History  Diagnosis Date  . CAD (coronary artery disease)     myoview 04/21/11-normal, EF49%  . S/P CABG (coronary artery bypass graft) 1996  . Hyperlipemia   . Polycythemia     H/O  . GERD (  gastroesophageal reflux disease)   . Tobacco abuse   . CKD (chronic kidney disease), stage III   . Myocardial infarction ([place]) 1996  . OSA on CPAP     CPAP q night , CPAP is through [place], states doesn't remember when he had the last study  . Pneumonia     hosp. as a child with pneumonia, not since   . Diabetes     BORDERLINE  . Headache     uses [employer], states he is lessening what he is taking for prep. for surgery   . Arthritis     in his back  . Hypertension     stress test- done 04/2014, followed by Dr. Gwenlyn Found  . Carotid stenosis 01/15/08    right endarterectomy-Dr. Amedeo Plenty  . PAD (peripheral artery disease) 06/12/09    a. 11/22/07 PTA &  stenting right external iliac artery;bilateral iliac & PTI and stenting 1997;right ICA =>50% reduction,right SFA >50%,left ICA at stent => 50% reduction,left EIA => 50% reduction,left ATA occluded, left SFA mid > 49% reduction;  03/2014 Angio: LRA 90, patent L Iliac stent and distal RCIA stent, large saccular AAA.  [name] Abdominal aortic aneurysm 01/28/10; 02/05/14    4.3x4.6cm;  now measuring 5.5 cm, status post aortobifemoral bypass grafting in January 2016 by Dr. problem along with left renal artery bypass    Social History Social History  Substance Use Topics  . Smoking status: Current Every Day Smoker -- 1.00 packs/day for 56 years    Types: Cigarettes  . Smokeless tobacco: Never Used  . Alcohol Use: No    Family History Family History  Problem Relation Age of Onset  . Heart failure Father     Surgical History Past Surgical History  Procedure Laterality Date  . Carotid endarterectomy Right 01/15/08  . Lumbar disc surgery  1998  . Pv angio  11/22/2007    PTA/ stenting of right common iliac artery with a 10x73mm Smart stent and postdilated with a 7x23mmpowerflex balloon; high grade calcified stenosis of the RICA  . Colonoscopy N/A 07/09/2013    Procedure: COLONOSCOPY;  Surgeon: Juanita Craver, MD;  Location: [facility];  Service: Endoscopy;  Laterality: N/A;  . Pv angio  04/01/14    AAA, Lt renal artery stenosis, patent iliacs  . Abdominal aortagram N/A 04/01/2014    Procedure: ABDOMINAL Maxcine Ham;  Surgeon: [name], MD;  Location: [facility] CATH LAB;  Service: Cardiovascular;  Laterality: N/A;  . Back surgery  1988    at [facility]  . Coronary artery bypass graft  1996    LIMA to LAD, sequential vein to OM1 and 2 as well as acure marginal branch and diag branch  . Aorta - bilateral femoral artery bypass graft N/A 05/30/2014    Procedure: AORTOBIFEMORAL BYPASS GRAFT;  Surgeon: Serafina Mitchell, MD;  Location: [facility];  Service: Vascular;  Laterality: N/A;  . Aortic/renal bypass Left 05/30/2014     Procedure: LEFT RENAL ARTERY BYPASS;  Surgeon: Serafina Mitchell, MD;  Location: [facility];  Service: Vascular;  Laterality: Left;  . Video assisted thoracoscopy (vats)/decortication Right 12/17/2014    Procedure: RIGHT VIDEO ASSISTED THORACOSCOPY (VATS);  Surgeon: Gaye Pollack, MD;  Location: [facility] OR;  Service: Thoracic;  Laterality: Right;  . Empyema drainage Right 12/17/2014    Procedure: EMPYEMA DRAINAGE;  Surgeon: Gaye Pollack, MD;  Location: [facility];  Service: Thoracic;  Laterality: Right;    Allergies  Allergen Reactions  . Niaspan [Niacin Er] Hives  Current Outpatient Prescriptions  Medication Sig Dispense Refill  . albuterol (PROVENTIL HFA;VENTOLIN HFA) 108 (90 BASE) MCG/ACT inhaler Inhale 2 puffs into the lungs every 6 (six) hours as needed for wheezing or shortness of breath. 1 Inhaler 2  . amLODipine (NORVASC) 10 MG tablet Take 5 mg by mouth daily before breakfast. 1/2 tab    . atorvastatin (LIPITOR) 80 MG tablet Take 80 mg by mouth at bedtime.     . bisoprolol (ZEBETA) 5 MG tablet Take 5 mg by mouth daily.  0  . furosemide (LASIX) 40 MG tablet Take 40 mg by mouth 2 (two) times daily.    [name] omeprazole (PRILOSEC) 20 MG capsule Take 20 mg by mouth at bedtime.     . potassium chloride SA (K-DUR,KLOR-CON) 20 MEQ tablet Take 20 mEq by mouth daily.    . tamsulosin (FLOMAX) 0.4 MG CAPS capsule Take 0.4 mg by mouth as directed. Take 2 tablets daily at night    . warfarin (COUMADIN) 5 MG tablet Take 1 tablet (5 mg total) by mouth daily at 6 PM. (Patient taking differently: Take 5 mg by mouth daily at 6 PM. As directed) 30 tablet 4  . oxyCODONE (OXY IR/ROXICODONE) 5 MG immediate release tablet Take 1 tablet (5 mg total) by mouth every 6 (six) hours as needed for severe pain. (Patient not taking: Reported on 12/01/2015) 30 tablet 0   No current facility-administered medications for this visit.     REVIEW OF SYSTEMS: See HPI for pertinent positives and negatives.  Physical Examination Filed  Vitals:   12/01/15 0922 12/01/15 0927  BP: 144/73 149/65  Pulse: 50   Temp: 97.1 F (36.2 C)   TempSrc: Oral   Resp: 16   Height: 5\' 7"  (1.702 m)   Weight: 195 lb (88.451 kg)   SpO2: 96%    Body mass index is 30.53 kg/(m^2).  General:  WDWN, obese male in NAD Gait: Normal HENT: WNL Eyes: Pupils equal Pulmonary: normal non-labored breathing, CTAB Cardiac: RRR, no murmur detected  Abdomen: soft, NT, +ventral hernia. Skin: no rashes, no ulcers, no cellulitis.   VASCULAR EXAM  Carotid Bruits Right Left   Positive Negative                            Radial pulses are 2+ palpable bilaterally. Aorta is not palpable  VASCULAR EXAM: Extremities without ischemic changes, without Gangrene; without open wounds. Dry, flaky skin of lower legs, + hemosiderin deposits in both lower legs.                                                                                                           LE Pulses Right Left       FEMORAL  2+ palpable  2+ palpable        POPLITEAL  not palpable   1+ palpable       POSTERIOR TIBIAL  2+ palpable   1+ palpable        DORSALIS PEDIS  ANTERIOR TIBIAL 1+ palpable  1+ palpable     Musculoskeletal: no muscle wasting or atrophy; no peripheral edema  Neurologic: A&O X 3; Appropriate Affect ;  SENSATION: normal; MOTOR FUNCTION: 5/5 Symmetric, CN 2-12 intact Speech is fluent/normal    Non-Invasive Vascular Imaging (12/01/2015):   RENAL ARTERY DUPLEX EVALUATION    INDICATION: PVD    PREVIOUS INTERVENTION(S): Aortobifemoral bypass graft and left renal artery bypass with graft 05/30/2014.    DUPLEX EXAM: Left renal arterial duplex    AORTA Peak Systolic Velocity (cm/s): NA    RIGHT  LEFT   Peak Systolic Velocities (cm/s) Comments  Peak Systolic Velocities (cm/s) Comments    Renal Artery Origin/Proximal 112     Renal Artery Mid 125 curve    Renal Artery Distal 109     Accessory Renal Artery (when present)      Renal / Aortic Ratio  (RAR) NA   Kidney Size (cm) 12.6    Renal Vein patent    ADDITIONAL FINDINGS: Fluid is noted around the kidney, with hydronephrosis and small cystic appearing structure.    IMPRESSION: 1. Technically difficult exam due to body habitus and excessive bowel gas 2. The renal bypass appears patent.    Compared to the previous exam:  No significant changes in velocities and kidney diameter noted compared to previous exam of 11/18/2014.  Hydronephrosis, cyst, and small amount of fluid around the kidney appears to be new findings.       ASSESSMENT:  Barry Lawrence is a 70 y.o. male who is status post aortobifemoral bypass with 16x8 graft and left renal artery bypass with 52mm graft on 05/30/14. He has no back or abdominal pain.  He has no claudication symptoms with walking. He has no signs of ischemia in his feet/legs. His blood pressure is in in fairly good control at this time.  Today's left renal artery duplex was a technically difficult exam due to body habitus and excessive bowel gas The renal bypass appears patent. No significant changes in velocities and kidney diameter noted compared to previous exam of 11/18/2014.  Hydronephrosis, cyst, and small amount of fluid around the kidney appears to be new findings.   ABI's today are supra systolic and not reliable due to calcified vessels. Both TBI's are normal, and all waveforms are triphasic except for left DP which is biphasic.  His atherosclerotic risk factors include active smoking and long term smoking, remote hx of MI with CABG  X 6 vessels, CKD, and dyslipidemia. Fortunately he does not have DM.  Dr. Gwenlyn Found has been following pt's carotid arteries by duplex.  PLAN:   The patient was counseled re smoking cessation and given several free resources re smoking cessation.   Based on today's exam and non-invasive vascular lab results, the patient will follow up in 1 year with the following tests: ABI's and left renal artery duplex. I  discussed in depth with the patient the nature of atherosclerosis, and emphasized the importance of maximal medical management including strict control of blood pressure, blood glucose, and lipid levels, obtaining regular exercise, and cessation of smoking.  The patient is aware that without maximal medical management the underlying atherosclerotic disease process will progress, limiting the benefit of any interventions.   The patient was given information about PAD including signs, symptoms, treatment, what symptoms should prompt the patient to seek immediate medical care, and risk reduction measures to take. Thank you for allowing [place] to participate in this patient's care.  Vinnie Level Demaree Liberto, RN,  MSN, FNP-C [facility] Office: [phone]  Clinic MD: Kellie Simmering 12/01/2015 9:40 AM

## 2015-12-01 NOTE — Patient Instructions (Addendum)
Steps to Quit Smoking  Smoking tobacco can be harmful to your health and can affect almost every organ in your body. Smoking puts you, and those around you, at risk for developing many serious chronic diseases. Quitting smoking is difficult, but it is one of the best things that you can do for your health. It is never too late to quit. WHAT ARE THE BENEFITS OF QUITTING SMOKING? When you quit smoking, you lower your risk of developing serious diseases and conditions, such as:  Lung cancer or lung disease, such as COPD.  Heart disease.  Stroke.  Heart attack.  Infertility.  Osteoporosis and bone fractures. Additionally, symptoms such as coughing, wheezing, and shortness of breath may get better when you quit. You may also find that you get sick less often because your body is stronger at fighting off colds and infections. If you are pregnant, quitting smoking can help to reduce your chances of having a baby of low birth weight. HOW DO I GET READY TO QUIT? When you decide to quit smoking, create a plan to make sure that you are successful. Before you quit:  Pick a date to quit. Set a date within the next two weeks to give you time to prepare.  Write down the reasons why you are quitting. Keep this list in places where you will see it often, such as on your bathroom mirror or in your car or wallet.  Identify the people, places, things, and activities that make you want to smoke (triggers) and avoid them. Make sure to take these actions:  Throw away all cigarettes at home, at work, and in your car.  Throw away smoking accessories, such as ashtrays and lighters.  Clean your car and make sure to empty the ashtray.  Clean your home, including curtains and carpets.  Tell your family, friends, and coworkers that you are quitting. Support from your loved ones can make quitting easier.  Talk with your health care provider about your options for quitting smoking.  Find out what treatment  options are covered by your health insurance. WHAT STRATEGIES CAN I USE TO QUIT SMOKING?  Talk with your healthcare provider about different strategies to quit smoking. Some strategies include:  Quitting smoking altogether instead of gradually lessening how much you smoke over a period of time. Research shows that quitting "cold turkey" is more successful than gradually quitting.  Attending in-person counseling to help you build problem-solving skills. You are more likely to have success in quitting if you attend several counseling sessions. Even short sessions of 10 minutes can be effective.  Finding resources and support systems that can help you to quit smoking and remain smoke-free after you quit. These resources are most helpful when you use them often. They can include:  Online chats with a counselor.  Telephone quitlines.  Printed self-help materials.  Support groups or group counseling.  Text messaging programs.  Mobile phone applications.  Taking medicines to help you quit smoking. (If you are pregnant or breastfeeding, talk with your health care provider first.) Some medicines contain nicotine and some do not. Both types of medicines help with cravings, but the medicines that include nicotine help to relieve withdrawal symptoms. Your health care provider may recommend:  Nicotine patches, gum, or lozenges.  Nicotine inhalers or sprays.  Non-nicotine medicine that is taken by mouth. Talk with your health care provider about combining strategies, such as taking medicines while you are also receiving in-person counseling. Using these two strategies together makes   you more likely to succeed in quitting than if you used either strategy on its own. If you are pregnant or breastfeeding, talk with your health care provider about finding counseling or other support strategies to quit smoking. Do not take medicine to help you quit smoking unless told to do so by your health care  provider. WHAT THINGS CAN I DO TO MAKE IT EASIER TO QUIT? Quitting smoking might feel overwhelming at first, but there is a lot that you can do to make it easier. Take these important actions:  Reach out to your family and friends and ask that they support and encourage you during this time. Call telephone quitlines, reach out to support groups, or work with a counselor for support.  Ask people who smoke to avoid smoking around you.  Avoid places that trigger you to smoke, such as bars, parties, or smoke-break areas at work.  Spend time around people who do not smoke.  Lessen stress in your life, because stress can be a smoking trigger for some people. To lessen stress, try:  Exercising regularly.  Deep-breathing exercises.  Yoga.  Meditating.  Performing a body scan. This involves closing your eyes, scanning your body from head to toe, and noticing which parts of your body are particularly tense. Purposefully relax the muscles in those areas.  Download or purchase mobile phone or tablet apps (applications) that can help you stick to your quit plan by providing reminders, tips, and encouragement. There are many free apps, such as QuitGuide from the [employer] manager for Disease Control and Prevention). You can find other support for quitting smoking (smoking cessation) through smokefree.gov and other websites. HOW WILL I FEEL WHEN I QUIT SMOKING? Within the first 24 hours of quitting smoking, you may start to feel some withdrawal symptoms. These symptoms are usually most noticeable 2-3 days after quitting, but they usually do not last beyond 2-3 weeks. Changes or symptoms that you might experience include:  Mood swings.  Restlessness, anxiety, or irritation.  Difficulty concentrating.  Dizziness.  Strong cravings for sugary foods in addition to nicotine.  Mild weight gain.  Constipation.  Nausea.  Coughing or a sore throat.  Changes in how your medicines work in your  body.  A depressed mood.  Difficulty sleeping (insomnia). After the first 2-3 weeks of quitting, you may start to notice more positive results, such as:  Improved sense of smell and taste.  Decreased coughing and sore throat.  Slower heart rate.  Lower blood pressure.  Clearer skin.  The ability to breathe more easily.  Fewer sick days. Quitting smoking is very challenging for most people. Do not get discouraged if you are not successful the first time. Some people need to make many attempts to quit before they achieve long-term success. Do your best to stick to your quit plan, and talk with your health care provider if you have any questions or concerns.   This information is not intended to replace advice given to you by your health care provider. Make sure you discuss any questions you have with your health care provider.   Document Released: 05/04/2001 Document Revised: 09/24/2014 Document Reviewed: 09/24/2014 Elsevier Interactive Patient Education [employer].     Before your next abdominal ultrasound:  Take two Extra-Strength Gas-X capsules at bedtime the night before the test. Take another two Extra-Strength Gas-X capsules 3 hours before the test.       Peripheral Vascular Disease Peripheral vascular disease (PVD) is a disease of the  blood vessels that are not part of your heart and brain. A simple term for PVD is poor circulation. In most cases, PVD narrows the blood vessels that carry blood from your heart to the rest of your body. This can result in a decreased supply of blood to your arms, legs, and internal organs, like your stomach or kidneys. However, it most often affects a person's lower legs and feet. There are two types of PVD.  Organic PVD. This is the more common type. It is caused by damage to the structure of blood vessels.  Functional PVD. This is caused by conditions that make blood vessels contract and tighten (spasm). Without treatment,  PVD tends to get worse over time. PVD can also lead to acute ischemic limb. This is when an arm or limb suddenly has trouble getting enough blood. This is a medical emergency. CAUSES Each type of PVD has many different causes. The most common cause of PVD is buildup of a fatty material (plaque) inside of your arteries (atherosclerosis). Small amounts of plaque can break off from the walls of the blood vessels and become lodged in a smaller artery. This blocks blood flow and can cause acute ischemic limb. Other common causes of PVD include:  Blood clots that form inside of blood vessels.  Injuries to blood vessels.  Diseases that cause inflammation of blood vessels or cause blood vessel spasms.  Health behaviors and health history that increase your risk of developing PVD. RISK FACTORS  You may have a greater risk of PVD if you:  Have a family history of PVD.  Have certain medical conditions, including:  High cholesterol.  Diabetes.  High blood pressure (hypertension).  Coronary heart disease.  Past problems with blood clots.  Past injury, such as burns or a broken bone. These may have damaged blood vessels in your limbs.  Buerger disease. This is caused by inflamed blood vessels in your hands and feet.  Some forms of arthritis.  Rare birth defects that affect the arteries in your legs.  Use tobacco.  Do not get enough exercise.  Are obese.  Are age 43 or older. SIGNS AND SYMPTOMS  PVD may cause many different symptoms. Your symptoms depend on what part of your body is not getting enough blood. Some common signs and symptoms include:  Cramps in your lower legs. This may be a symptom of poor leg circulation (claudication).  Pain and weakness in your legs while you are physically active that goes away when you rest (intermittent claudication).  Leg pain when at rest.  Leg numbness, tingling, or weakness.  Coldness in a leg or foot, especially when compared with  the other leg.  Skin or hair changes. These can include:  Hair loss.  Shiny skin.  Pale or bluish skin.  Thick toenails.  Inability to get or maintain an erection (erectile dysfunction). People with PVD are more prone to developing ulcers and sores on their toes, feet, or legs. These may take longer than normal to heal. DIAGNOSIS Your health care provider may diagnose PVD from your signs and symptoms. The health care provider will also do a physical exam. You may have tests to find out what is causing your PVD and determine its severity. Tests may include:  Blood pressure recordings from your arms and legs and measurements of the strength of your pulses (pulse volume recordings).  Imaging studies using sound waves to take pictures of the blood flow through your blood vessels (Doppler ultrasound).  Injecting  a dye into your blood vessels before having imaging studies using:  X-rays (angiogram or arteriogram).  Computer-generated X-rays (CT angiogram).  A powerful electromagnetic field and a computer (magnetic resonance angiogram or MRA). TREATMENT Treatment for PVD depends on the cause of your condition and the severity of your symptoms. It also depends on your age. Underlying causes need to be treated and controlled. These include long-lasting (chronic) conditions, such as diabetes, high cholesterol, and high blood pressure. You may need to first try making lifestyle changes and taking medicines. Surgery may be needed if these do not work. Lifestyle changes may include:  Quitting smoking.  Exercising regularly.  Following a low-fat, low-cholesterol diet. Medicines may include:  Blood thinners to prevent blood clots.  Medicines to improve blood flow.  Medicines to improve your blood cholesterol levels. Surgical procedures may include:  A procedure that uses an inflated balloon to open a blocked artery and improve blood flow (angioplasty).  A procedure to put in a tube  (stent) to keep a blocked artery open (stent implant).  Surgery to reroute blood flow around a blocked artery (peripheral bypass surgery).  Surgery to remove dead tissue from an infected wound on the affected limb.  Amputation. This is surgical removal of the affected limb. This may be necessary in cases of acute ischemic limb that are not improved through medical or surgical treatments. HOME CARE INSTRUCTIONS  Take medicines only as directed by your health care provider.  Do not use any tobacco products, including cigarettes, chewing tobacco, or electronic cigarettes. If you need help quitting, ask your health care provider.  Lose weight if you are overweight, and maintain a healthy weight as directed by your health care provider.  Eat a diet that is low in fat and cholesterol. If you need help, ask your health care provider.  Exercise regularly. Ask your health care provider to suggest some good activities for you.  Use compression stockings or other mechanical devices as directed by your health care provider.  Take good care of your feet.  Wear comfortable shoes that fit well.  Check your feet often for any cuts or sores. SEEK MEDICAL CARE IF:  You have cramps in your legs while walking.  You have leg pain when you are at rest.  You have coldness in a leg or foot.  Your skin changes.  You have erectile dysfunction.  You have cuts or sores on your feet that are not healing. SEEK IMMEDIATE MEDICAL CARE IF:  Your arm or leg turns cold and blue.  Your arms or legs become red, warm, swollen, painful, or numb.  You have chest pain or trouble breathing.  You suddenly have weakness in your face, arm, or leg.  You become very confused or lose the ability to speak.  You suddenly have a very bad headache or lose your vision.   This information is not intended to replace advice given to you by your health care provider. Make sure you discuss any questions you have with  your health care provider.   Document Released: 06/17/2004 Document Revised: 05/31/2014 Document Reviewed: 10/18/2013 Elsevier Interactive Patient Education [employer].

## 2015-12-03 ENCOUNTER — Ambulatory Visit (INDEPENDENT_AMBULATORY_CARE_PROVIDER_SITE_OTHER): Payer: Non-veteran care | Admitting: Pharmacist

## 2015-12-03 DIAGNOSIS — I4891 Unspecified atrial fibrillation: Secondary | ICD-10-CM

## 2015-12-03 DIAGNOSIS — I480 Paroxysmal atrial fibrillation: Secondary | ICD-10-CM

## 2015-12-03 DIAGNOSIS — Z7901 Long term (current) use of anticoagulants: Secondary | ICD-10-CM | POA: Diagnosis not present

## 2015-12-03 LAB — POCT INR: INR: 2.2

## 2015-12-31 ENCOUNTER — Ambulatory Visit (INDEPENDENT_AMBULATORY_CARE_PROVIDER_SITE_OTHER): Payer: Medicare Other | Admitting: *Deleted

## 2015-12-31 DIAGNOSIS — Z7901 Long term (current) use of anticoagulants: Secondary | ICD-10-CM | POA: Diagnosis not present

## 2015-12-31 DIAGNOSIS — I480 Paroxysmal atrial fibrillation: Secondary | ICD-10-CM

## 2015-12-31 DIAGNOSIS — I4891 Unspecified atrial fibrillation: Secondary | ICD-10-CM | POA: Diagnosis not present

## 2015-12-31 LAB — POCT INR: INR: 3

## 2016-01-13 ENCOUNTER — Telehealth: Payer: Self-pay | Admitting: Pharmacist Clinician (PhC)/ Clinical Pharmacy Specialist

## 2016-01-13 NOTE — Telephone Encounter (Signed)
Spoke with patient at length (15 minutes).  He developed hematuria on Sunday Aug 13 and held his warfarin.  On Wed Aug 16 he went to the [facility] in [place], [place] and saw urololgy/nephrololgy.  He states they took a urine sample and are looking for potential bladder cancer, but he has not heard from them since then.  Was also to get a CT of his bladder, but that did not happen.  It has now been a week since then and he has been without warfarin for 10 days.    Explained the risk of stroke due to AF.  Patient states that [facility] did 2 week monitor in Feb/March of this year and found no evidence of AF.  He states he has not felt any since before then.  Explained that AF can come back at any time, and although he is feeling free of symptoms, it does not guarantee that AF is gone.    He will start back on his regular dose of warfarin, however, should he notice any return of hematuria he will discontinue and contact the urologist again.    He has not seen Dr. Gwenlyn Found in > 1 year, will have scheduling contact him about getting an appointment.

## 2016-01-13 NOTE — Telephone Encounter (Signed)
-----   Message from Malen Gauze, RN sent at 01/08/2016  4:19 PM EDT ----- Pt called stating he has been trying to get intouch with you and/or Dr Gwenlyn Found and could not get through.  States he developed blood in his urine last week.  Stopped coumadin himself on Sunday 7/13.  Had appt with urology at [facility] on Wed. 8/16.  States they ordered an U/S of bladder/kidneys?? That was never done (per pt) and told him to come back in 3 mos.  Pt states he is not going back on coumadin.  Does not want to start bleeding again and doesn't want to take it any longer.  Would like to discuss with you/Dr Gwenlyn Found.  Please call pt @ [phone] Thanks and good luck! Lattie Haw

## 2016-01-28 ENCOUNTER — Ambulatory Visit (INDEPENDENT_AMBULATORY_CARE_PROVIDER_SITE_OTHER): Payer: Medicare Other | Admitting: *Deleted

## 2016-01-28 DIAGNOSIS — I480 Paroxysmal atrial fibrillation: Secondary | ICD-10-CM

## 2016-01-28 DIAGNOSIS — Z7901 Long term (current) use of anticoagulants: Secondary | ICD-10-CM

## 2016-01-28 DIAGNOSIS — I4891 Unspecified atrial fibrillation: Secondary | ICD-10-CM

## 2016-01-28 LAB — POCT INR: INR: 3.6

## 2016-02-11 ENCOUNTER — Telehealth: Payer: Self-pay | Admitting: Cardiovascular Disease

## 2016-02-11 NOTE — Telephone Encounter (Addendum)
Clearance notification faxed to provider at [facility]. I attempted to call and was unable to get through on automated phone system - no valid extension left (attempted extension provided and it connects to veterans crisis line).

## 2016-02-11 NOTE — Telephone Encounter (Signed)
Okay to hold his Coumadin for his urologic procedure.

## 2016-02-11 NOTE — Telephone Encounter (Signed)
New message   Request for surgical clearance:  1. What type of surgery is being performed? Cystoscopy Bilat. RPG w/possible bladdter biopsy  When is this surgery scheduled?Oct 1st  2. Are there any medications that need to be held prior to surgery and how long?5 days  3. Name of physician performing surgery? Dr. Clide Cliff @ [place] [place]    4. What is your office phone and fax number? [phone], [phone]  Will hold Warafin for 5 days starting Oct 1. Procedure is on Oct 1.

## 2016-02-11 NOTE — Telephone Encounter (Signed)
Clearance &coumadin hold request routed to MD.

## 2016-02-24 ENCOUNTER — Ambulatory Visit (INDEPENDENT_AMBULATORY_CARE_PROVIDER_SITE_OTHER): Payer: Medicare Other | Admitting: Cardiovascular Disease

## 2016-02-24 ENCOUNTER — Encounter: Payer: Self-pay | Admitting: Cardiovascular Disease

## 2016-02-24 VITALS — BP 158/83 | HR 56 | Ht 67.0 in | Wt 195.6 lb

## 2016-02-24 DIAGNOSIS — F17210 Nicotine dependence, cigarettes, uncomplicated: Secondary | ICD-10-CM

## 2016-02-24 DIAGNOSIS — I4891 Unspecified atrial fibrillation: Secondary | ICD-10-CM

## 2016-02-24 DIAGNOSIS — I2510 Atherosclerotic heart disease of native coronary artery without angina pectoris: Secondary | ICD-10-CM | POA: Diagnosis not present

## 2016-02-24 DIAGNOSIS — I714 Abdominal aortic aneurysm, without rupture, unspecified: Secondary | ICD-10-CM

## 2016-02-24 DIAGNOSIS — I701 Atherosclerosis of renal artery: Secondary | ICD-10-CM

## 2016-02-24 DIAGNOSIS — I119 Hypertensive heart disease without heart failure: Secondary | ICD-10-CM

## 2016-02-24 DIAGNOSIS — E7800 Pure hypercholesterolemia, unspecified: Secondary | ICD-10-CM

## 2016-02-24 DIAGNOSIS — I739 Peripheral vascular disease, unspecified: Secondary | ICD-10-CM

## 2016-02-24 DIAGNOSIS — I6521 Occlusion and stenosis of right carotid artery: Secondary | ICD-10-CM | POA: Diagnosis not present

## 2016-02-24 NOTE — Assessment & Plan Note (Signed)
History of PAD status post bilateral iliac stenting as well as SFA intervention.

## 2016-02-24 NOTE — Progress Notes (Signed)
02/24/2016 Barry Lawrence   1946/02/01  [id number]  Primary Physician [name], MD Primary Cardiologist: [name] MD [name]  HPI:  The patient is a 70 year old moderately-overweight married Caucasian male, father of 2 and grandfather to 8 grandchildren, whom I last saw 07/26/14 at the time of this peripheral angiogram.. He has a history of CAD, status post coronary artery bypass grafting in 1996 with a LIMA to his LAD, sequential vein to OM1 and 2 as well as acute marginal branch and diagonal branch. 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 [name] January 15, 2008. Carotid Dopplers performed in March of this year showed a widely patent endarterectomy site. He has had both iliacs stented as well as his SFAs in the past.. He has a large -sized abdominal aortic aneurysm measuring 5.5 x 5.5cm. We have been following this by duplex ultrasound. His other risk factors include hypertension, hyperlipidemia, and continued tobacco abuse of 2 packs a day, smoking cigars, recalcitrant to risk-factor modification. He has obstructive sleep apnea and is on CPAP.   Since I saw him back a year ago he complained of increasing shortness of breath but denies chest pain. He apparently did have a Myoview stress test a year ago at the [facility].he does continue to complain of claudication although his ABI is really haven't changed and the stents appear to be open.  I performed peripheral angiography on him because of a enlarging abdominal aortic aneurysm 04/01/14 revealed a 90% left renal artery stenosis, large infrarenal abdominal aortic aneurysm and patent iliac stents. He ultimately underwent aortobifemoral bypass grafting by Dr. [name] 05/31/14 with a left renal bypass. His hospital course was compensated and prolonged. He had respiratory insufficiency with prolonged intubation, acute renal failure and atrial fibrillation ultimately  requiring Coumadin anticoagulation. His renal function ultimately improved with a creatinine around the 2 range. He's had progressive renal insufficiency. He continues to smoke one pack per day recalcitrant risk factor modification. He also has a ventral hernia.  .  Current Outpatient Prescriptions  Medication Sig Dispense Refill  . albuterol (PROVENTIL HFA;VENTOLIN HFA) 108 (90 BASE) MCG/ACT inhaler Inhale 2 puffs into the lungs every 6 (six) hours as needed for wheezing or shortness of breath. 1 Inhaler 2  . amLODipine (NORVASC) 10 MG tablet Take 5 mg by mouth daily before breakfast. 1/2 tab    . atorvastatin (LIPITOR) 80 MG tablet Take 80 mg by mouth at bedtime.     . bisoprolol (ZEBETA) 5 MG tablet Take 5 mg by mouth daily.  0  . cholecalciferol (VITAMIN D) 1000 units tablet Take 1,000 Units by mouth daily.    . furosemide (LASIX) 40 MG tablet Take 80 mg by mouth 2 (two) times daily.     [name] omeprazole (PRILOSEC) 20 MG capsule Take 20 mg by mouth at bedtime.     . potassium chloride SA (K-DUR,KLOR-CON) 20 MEQ tablet Take 20 mEq by mouth daily.    . tamsulosin (FLOMAX) 0.4 MG CAPS capsule Take 0.4 mg by mouth as directed. Take 2 tablets daily at night    . warfarin (COUMADIN) 5 MG tablet Take 1 tablet (5 mg total) by mouth daily at 6 PM. (Patient not taking: Reported on 02/24/2016) 30 tablet 4   No current facility-administered medications for this visit.     Allergies  Allergen Reactions  . Niaspan Durene Cal Er] Hives    Social History  Social History  . Marital status: Married    Spouse name: N/A  . Number of children: 2  . Years of education: N/A   Occupational History  . Retired Estate manager/land agent    Social History Main Topics  . Smoking status: Current Every Day Smoker    Packs/day: 1.00    Years: 56.00    Types: Cigarettes  . Smokeless tobacco: Never Used  . Alcohol use No  . Drug use: No  . Sexual activity: Not on file   Other Topics Concern  . Not on file   Social  History Narrative   Lives with wife.     Review of Systems: General: negative for chills, fever, night sweats or weight changes.  Cardiovascular: negative for chest pain, dyspnea on exertion, edema, orthopnea, palpitations, paroxysmal nocturnal dyspnea or shortness of breath Dermatological: negative for rash Respiratory: negative for cough or wheezing Urologic: negative for hematuria Abdominal: negative for nausea, vomiting, diarrhea, bright red blood per rectum, melena, or hematemesis Neurologic: negative for visual changes, syncope, or dizziness All other systems reviewed and are otherwise negative except as noted above.    Blood pressure (!) 158/83, pulse (!) 56, height 5\' 7"  (1.702 m), weight 195 lb 9.6 oz (88.7 kg), SpO2 95 %.  General appearance: alert and no distress Neck: no adenopathy, no JVD, supple, symmetrical, trachea midline, thyroid not enlarged, symmetric, no tenderness/mass/nodules and Soft bilateral carotid bruits Lungs: clear to auscultation bilaterally Heart: regular rate and rhythm, S1, S2 normal, no murmur, click, rub or gallop Extremities: extremities normal, atraumatic, no cyanosis or edema  EKG sinus bradycardia at 56 with right bundle branch block, inferior Q waves and lateral T-wave abnormalities. I personally reviewed this EKG  ASSESSMENT AND PLAN:   CAD (coronary artery disease) History of CAD status post bypass grafting in 1996 by Dr. Prescott Gum  with a LIMA to his LAD, vein to obtuse marginal branches 1 and 2, acute marginal branch and diagonal branch. His last Myoview stress test performed 05/02/14 was nonischemic. He is chronically short of breath but denies chest pain.  Carotid artery disease History of carotid artery disease status post right carotid endarterectomy performed by Dr. [name] 01/15/08. His last carotid Doppler study performed 10/16/14 revealed no evidence of by the stenosis with a widely patent endarterectomy site  Hypertensive heart  disease History of hypertension blood pressure measured 158/83. He is on amlodipine, bisoprolol. Continue current meds at current dosing  Hyperlipidemia History of hyperlipidemia on statin therapy followed by his PCP  Abdominal aortic aneurysm without rupture ([place]) History of abdominal aortic aneurysm status post aortobifemoral bypass grafting by Dr. [name] 05/31/14 with left renal artery bypass simultaneously. His postop course was protracted with respiratory insufficiency.  Cigarette smoker History of ongoing tobacco abuse of one pack per day recalcitrant risk factor modification  PAD (peripheral artery disease) History of PAD status post bilateral iliac stenting as well as SFA intervention.  Atrial fibrillation History of paroxysmal atrial fibrillation currently maintaining sinus rhythm on Coumadin anticoagulation which has been laced on hold because of hematuria. He is scheduled to see a urologist for cystoscopy.      [name] MD FACP,FACC,FAHA, [facility] 02/24/2016 11:59 AM

## 2016-02-24 NOTE — Assessment & Plan Note (Signed)
History of ongoing tobacco abuse of one pack per day recalcitrant risk factor modification

## 2016-02-24 NOTE — Assessment & Plan Note (Signed)
History of paroxysmal atrial fibrillation currently maintaining sinus rhythm on Coumadin anticoagulation which has been laced on hold because of hematuria. He is scheduled to see a urologist for cystoscopy.

## 2016-02-24 NOTE — Assessment & Plan Note (Signed)
History of CAD status post bypass grafting in 1996 by Dr. Prescott Gum  with a LIMA to his LAD, vein to obtuse marginal branches 1 and 2, acute marginal branch and diagonal branch. His last Myoview stress test performed 05/02/14 was nonischemic. He is chronically short of breath but denies chest pain.

## 2016-02-24 NOTE — Assessment & Plan Note (Signed)
History of carotid artery disease status post right carotid endarterectomy performed by Dr. [name] 01/15/08. His last carotid Doppler study performed 10/16/14 revealed no evidence of by the stenosis with a widely patent endarterectomy site

## 2016-02-24 NOTE — Assessment & Plan Note (Signed)
History of hyperlipidemia on statin therapy followed by his PCP 

## 2016-02-24 NOTE — Patient Instructions (Signed)
Medication Instructions:  NO CHANGES.   Follow-Up: Your physician wants you to follow-up in: 12 MONTHS WITH DR BERRY.  You will receive a reminder letter in the mail two months in advance. If you don't receive a letter, please call our office to schedule the follow-up appointment.   If you need a refill on your cardiac medications before your next appointment, please call your pharmacy.   

## 2016-02-24 NOTE — Assessment & Plan Note (Signed)
History of hypertension blood pressure measured 158/83. He is on amlodipine, bisoprolol. Continue current meds at current dosing

## 2016-02-24 NOTE — Assessment & Plan Note (Signed)
History of abdominal aortic aneurysm status post aortobifemoral bypass grafting by Dr. [name] 05/31/14 with left renal artery bypass simultaneously. His postop course was protracted with respiratory insufficiency.

## 2016-03-04 ENCOUNTER — Telehealth: Payer: Self-pay | Admitting: *Deleted

## 2016-03-04 NOTE — Telephone Encounter (Signed)
Pt would like for you to give him a call, he'd stopped his coumadin and needs to know when to start back

## 2016-03-04 NOTE — Telephone Encounter (Signed)
Spoke with pt.  He has been off coumadin for a cystoscopy due to blood in urine.  He restarted coumadin 03/01/16 and needs to make INR appt .  Appt made and pt verbalized understanding.

## 2016-03-15 ENCOUNTER — Ambulatory Visit (INDEPENDENT_AMBULATORY_CARE_PROVIDER_SITE_OTHER): Payer: Medicare Other | Admitting: *Deleted

## 2016-03-15 DIAGNOSIS — I480 Paroxysmal atrial fibrillation: Secondary | ICD-10-CM

## 2016-03-15 DIAGNOSIS — I4891 Unspecified atrial fibrillation: Secondary | ICD-10-CM

## 2016-03-15 DIAGNOSIS — Z7901 Long term (current) use of anticoagulants: Secondary | ICD-10-CM | POA: Diagnosis not present

## 2016-03-15 LAB — POCT INR: INR: 3.1

## 2016-04-19 ENCOUNTER — Ambulatory Visit (INDEPENDENT_AMBULATORY_CARE_PROVIDER_SITE_OTHER): Payer: Medicare Other | Admitting: *Deleted

## 2016-04-19 DIAGNOSIS — I701 Atherosclerosis of renal artery: Secondary | ICD-10-CM | POA: Diagnosis not present

## 2016-04-19 DIAGNOSIS — I4891 Unspecified atrial fibrillation: Secondary | ICD-10-CM

## 2016-04-19 DIAGNOSIS — Z7901 Long term (current) use of anticoagulants: Secondary | ICD-10-CM

## 2016-04-19 DIAGNOSIS — I480 Paroxysmal atrial fibrillation: Secondary | ICD-10-CM | POA: Diagnosis not present

## 2016-04-19 LAB — POCT INR: INR: 2.3

## 2016-04-30 ENCOUNTER — Telehealth: Payer: Self-pay | Admitting: *Deleted

## 2016-04-30 NOTE — Telephone Encounter (Signed)
Pt is having problems getting his warfarin (COUMADIN) 5 MG tablet [[id number]] , they're telling him at his pharmacy to call his Dr.

## 2016-05-03 MED ORDER — WARFARIN SODIUM 5 MG PO TABS: ORAL_TABLET | ORAL | 4 refills | Status: DC

## 2016-05-03 NOTE — Telephone Encounter (Signed)
Warfarin Rx sent to Pharmacy

## 2016-05-26 ENCOUNTER — Encounter: Payer: Self-pay | Admitting: Cardiology

## 2016-07-04 IMAGING — CR DG CHEST 1V PORT
1 series · 1 of 1 positions shown · non-contrast
Comparison: Chest CT 03/05/2014

CLINICAL DATA: Status post aortic aneurysm repair. Respiratory
failure.

EXAM:
PORTABLE CHEST - 1 VIEW

[AP]
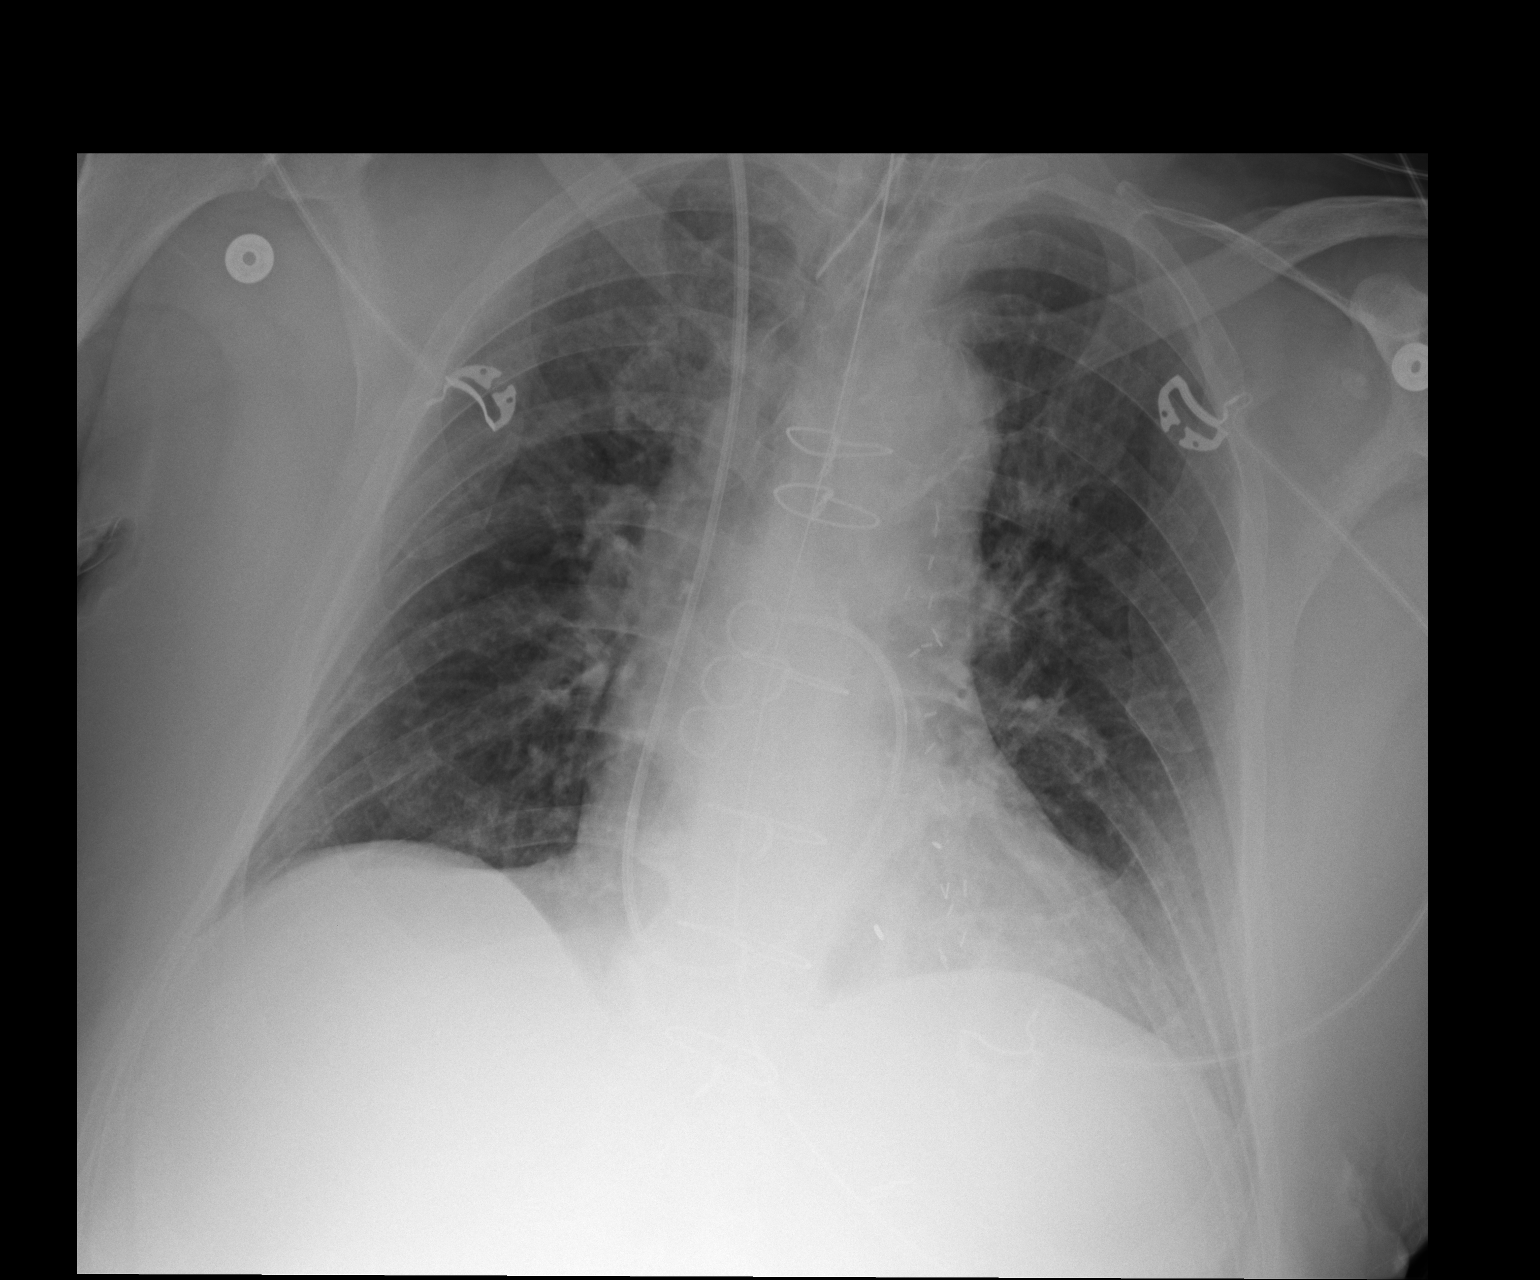

[1 of 1 positions shown; findings below may reference images not displayed]

FINDINGS: Endotracheal tube is 6.4 cm from the carina. Enteric tube in place,
tip below the diaphragm not included in the field of view. Right
internal jugular Swan-Ganz catheter tip in the region of the
pulmonary outflow tract. Patient is post median sternotomy. The
heart size is normal. No definite chest tubes are seen. There is no
pleural effusion or pneumothorax. Minimal vascular congestion. No
pulmonary edema.
IMPRESSION: 1. Endotracheal tube, enteric tube, and Swan-Ganz catheter in place.
2. Minimal vascular congestion.

## 2016-07-09 IMAGING — CR DG ABD PORTABLE 1V
1 series · 1 of 1 positions shown · non-contrast
Comparison: Portable exam 1965 hr compared to CT abdomen and pelvis
03/05/2014

CLINICAL DATA: Gastric tube placement

EXAM:
PORTABLE ABDOMEN - 1 VIEW

[AP]
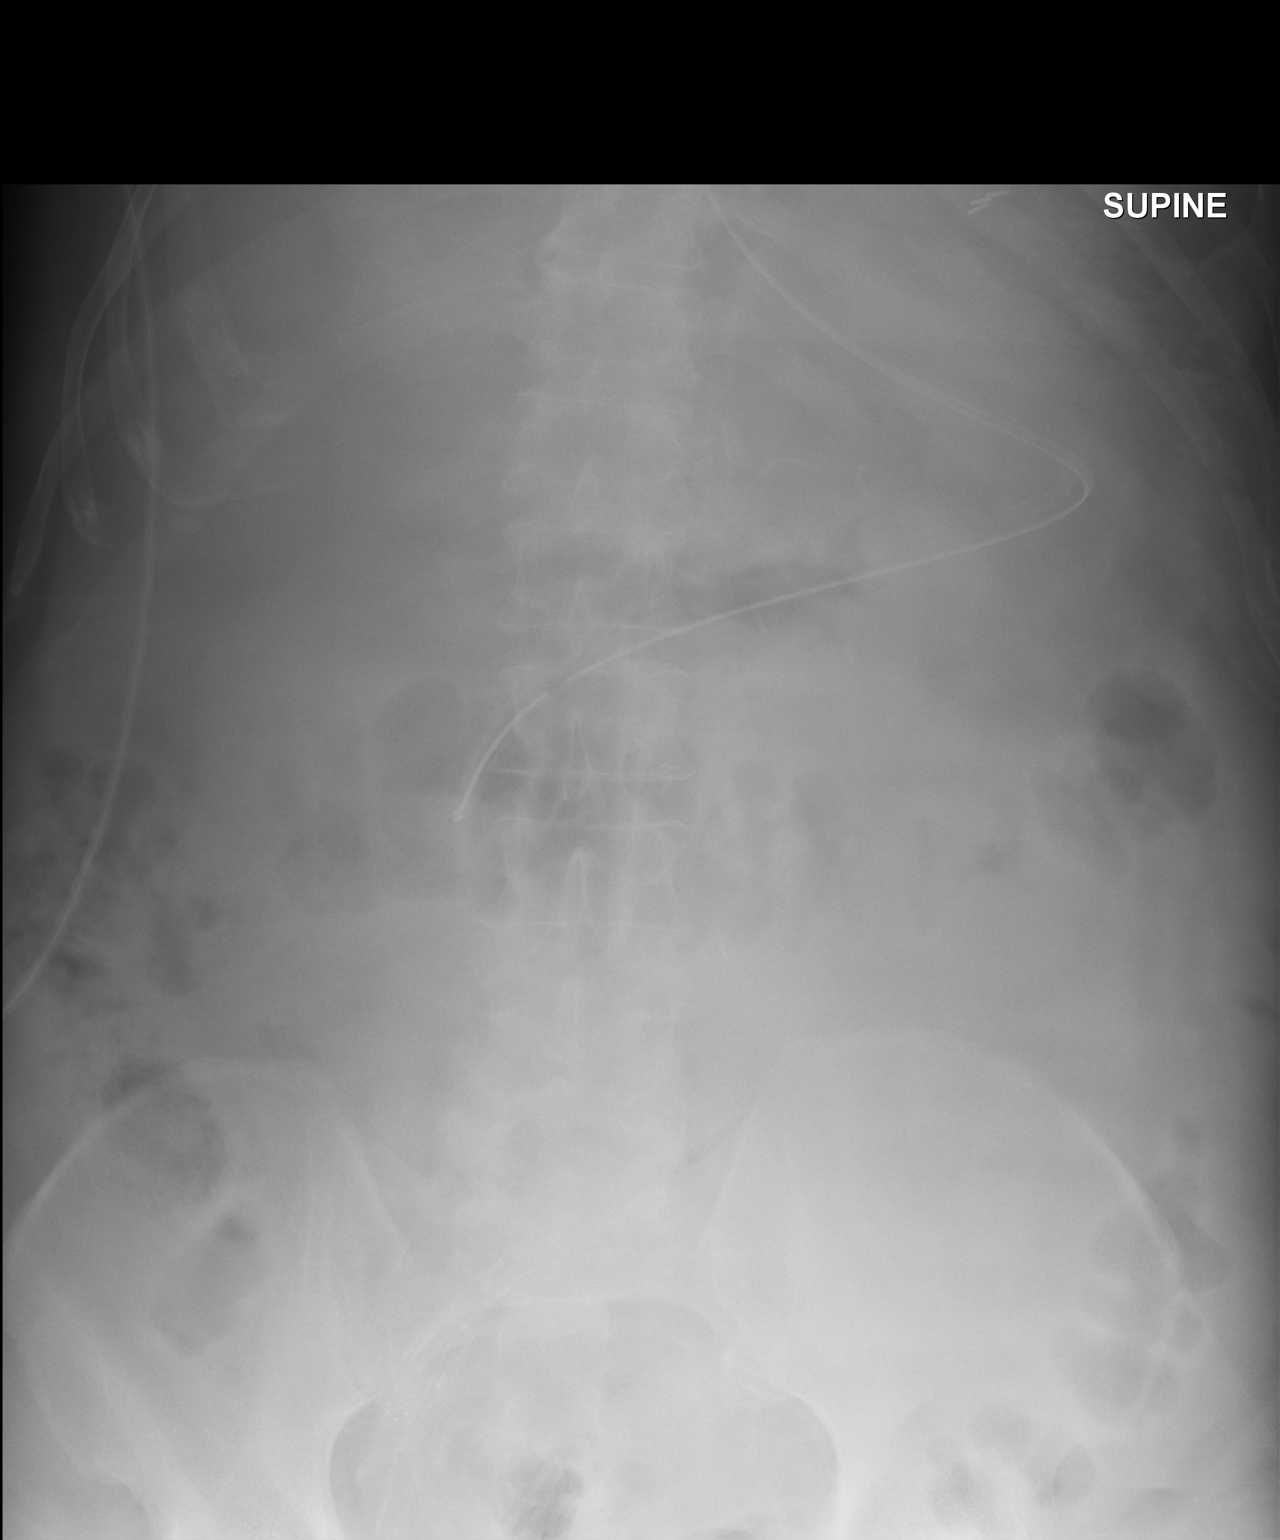

[1 of 1 positions shown; findings below may reference images not displayed]

FINDINGS: Tip of gastric tube projects over expected position of the proximal
descending duodenum.

Bowel gas pattern normal.

Prior median sternotomy.

Opacified LEFT lung base.

Osseous demineralization.
IMPRESSION: Tip of gastric tube projects over expected position of the proximal
descending duodenum.

## 2016-07-09 IMAGING — CR DG CHEST 1V PORT
1 series · 1 of 1 positions shown · non-contrast
Comparison: 06/03/2014

CLINICAL DATA: Ventilator dependent respiratory failure.

EXAM:
PORTABLE CHEST - 1 VIEW

[AP]
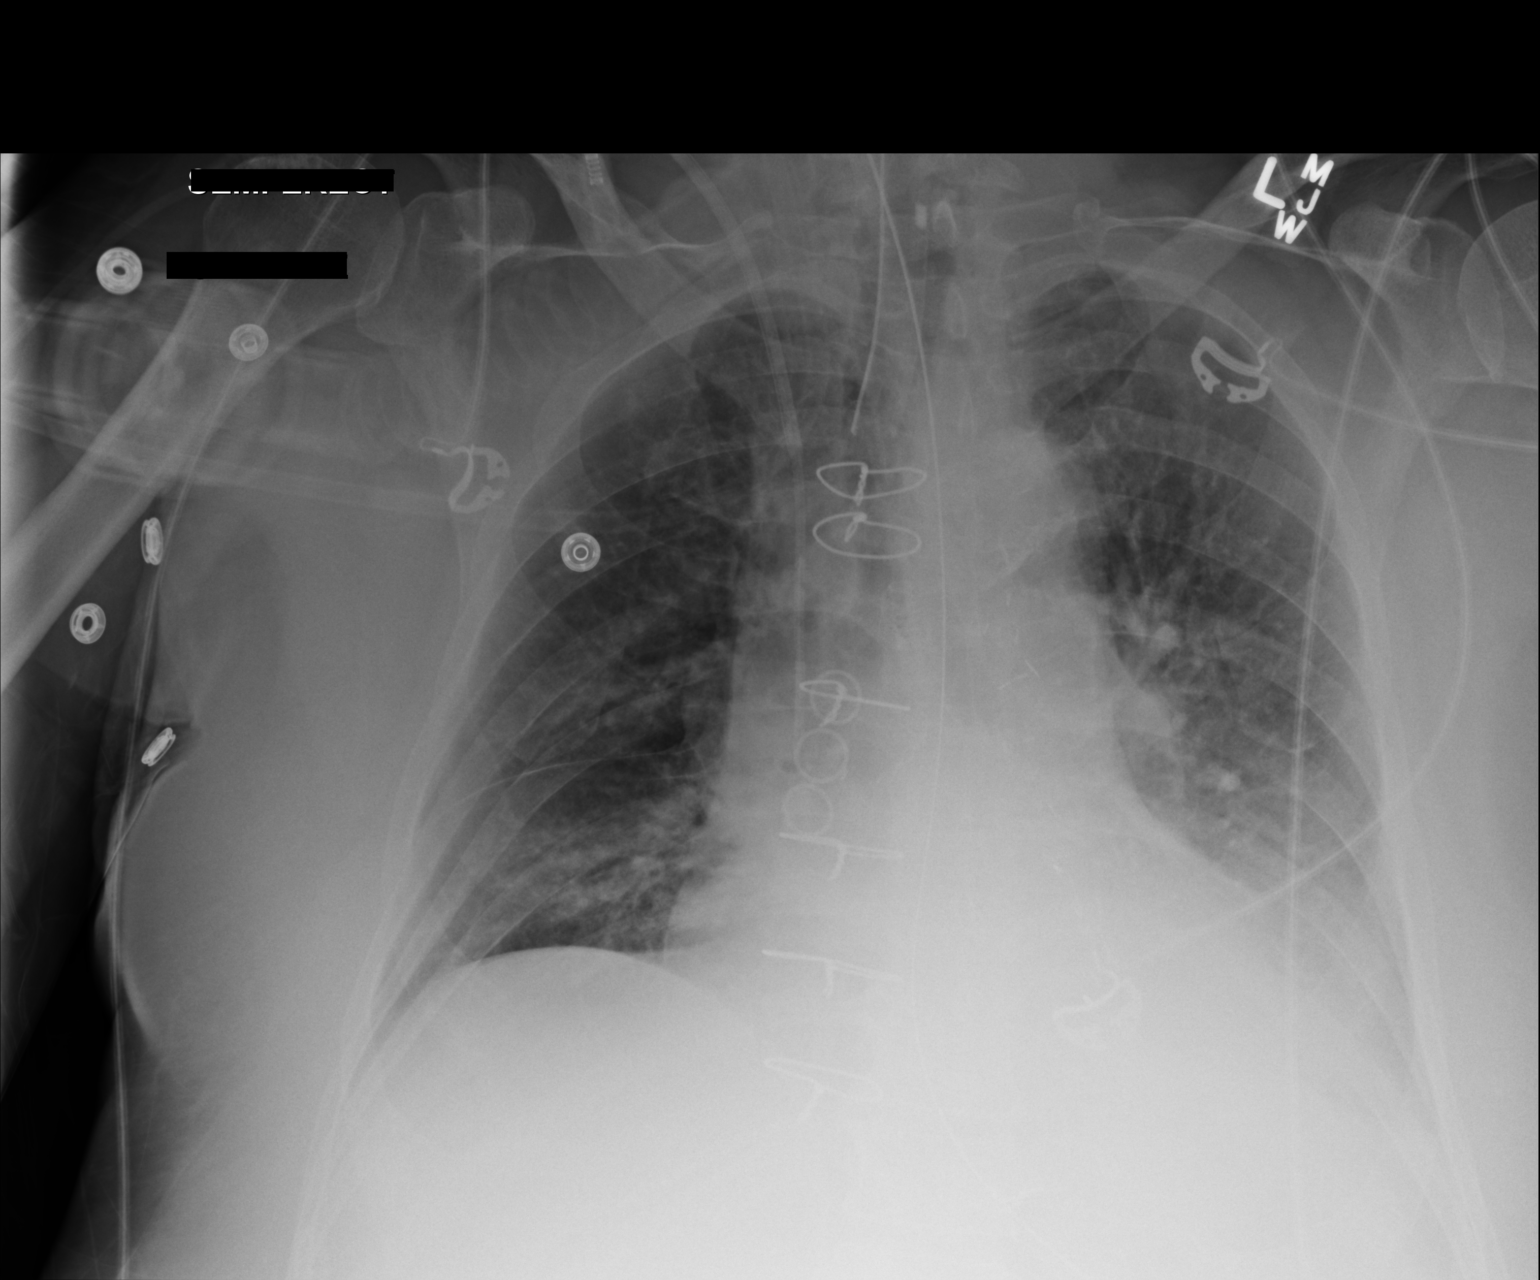

[1 of 1 positions shown; findings below may reference images not displayed]

FINDINGS: Patient slightly rotated left. Sternotomy wires unchanged.
Endotracheal tube has tip 5.4 cm above the carinal. Enteric tube
courses into the region of the stomach and off the inferior portion
of the film. Right IJ venous catheter unchanged with tip overlying
SVC.

Persistent opacification over the left base likely a combination of
pleural fluid and atelectasis although cannot exclude infection.
Improved aeration over the right base. Stable mild cardiomegaly.
Remainder of the exam is unchanged.
IMPRESSION: Persistent left base opacification likely effusion with atelectasis
although cannot exclude infection.

Tubes and lines as described.

## 2016-07-19 IMAGING — CR DG CHEST 1V PORT
1 series · 1 of 1 positions shown · non-contrast
Comparison: June 13, 2014

CLINICAL DATA: Hypoxia

EXAM:
PORTABLE CHEST - 1 VIEW

[portable]
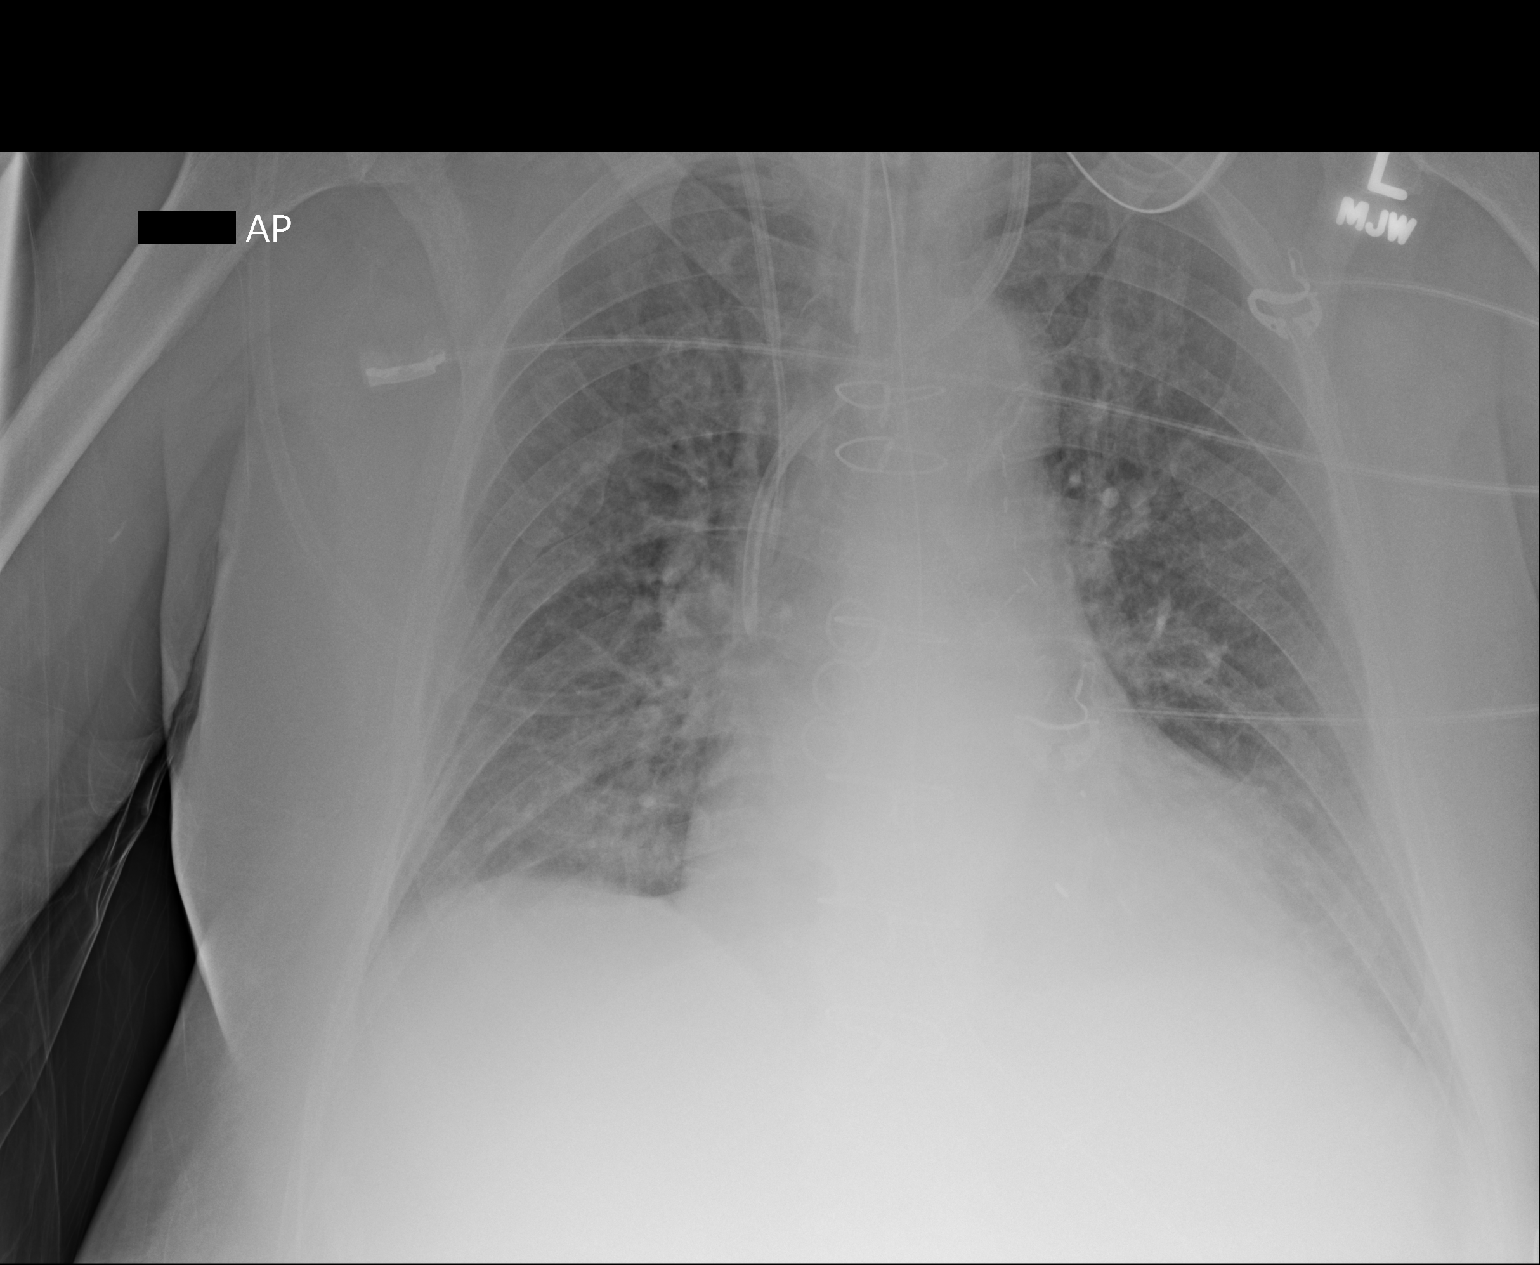

[1 of 1 positions shown; findings below may reference images not displayed]

FINDINGS: Endotracheal tube tip is 4.5 cm above the carina. Nasogastric tube
tip and side port are below the diaphragm. Central catheter tips are
in the superior vena cava, stable. No pneumothorax. Mild generalized
interstitial edema remains. There is no airspace consolidation. No
new opacity. Heart is borderline enlarged with pulmonary vascularity
within normal limits. No adenopathy.
IMPRESSION: Tube and catheter positions as described without pneumothorax. Heart
slightly prominent with mild edema. Question a degree of underlying
congestive heart failure. No airspace consolidation.

## 2016-07-24 IMAGING — CR DG CHEST 1V PORT
1 series · 1 of 1 positions shown · non-contrast
Comparison: Portable chest x-ray June 17, 2014

CLINICAL DATA: Follow-up of respiratory failure

EXAM:
PORTABLE CHEST - 1 VIEW

[AP]
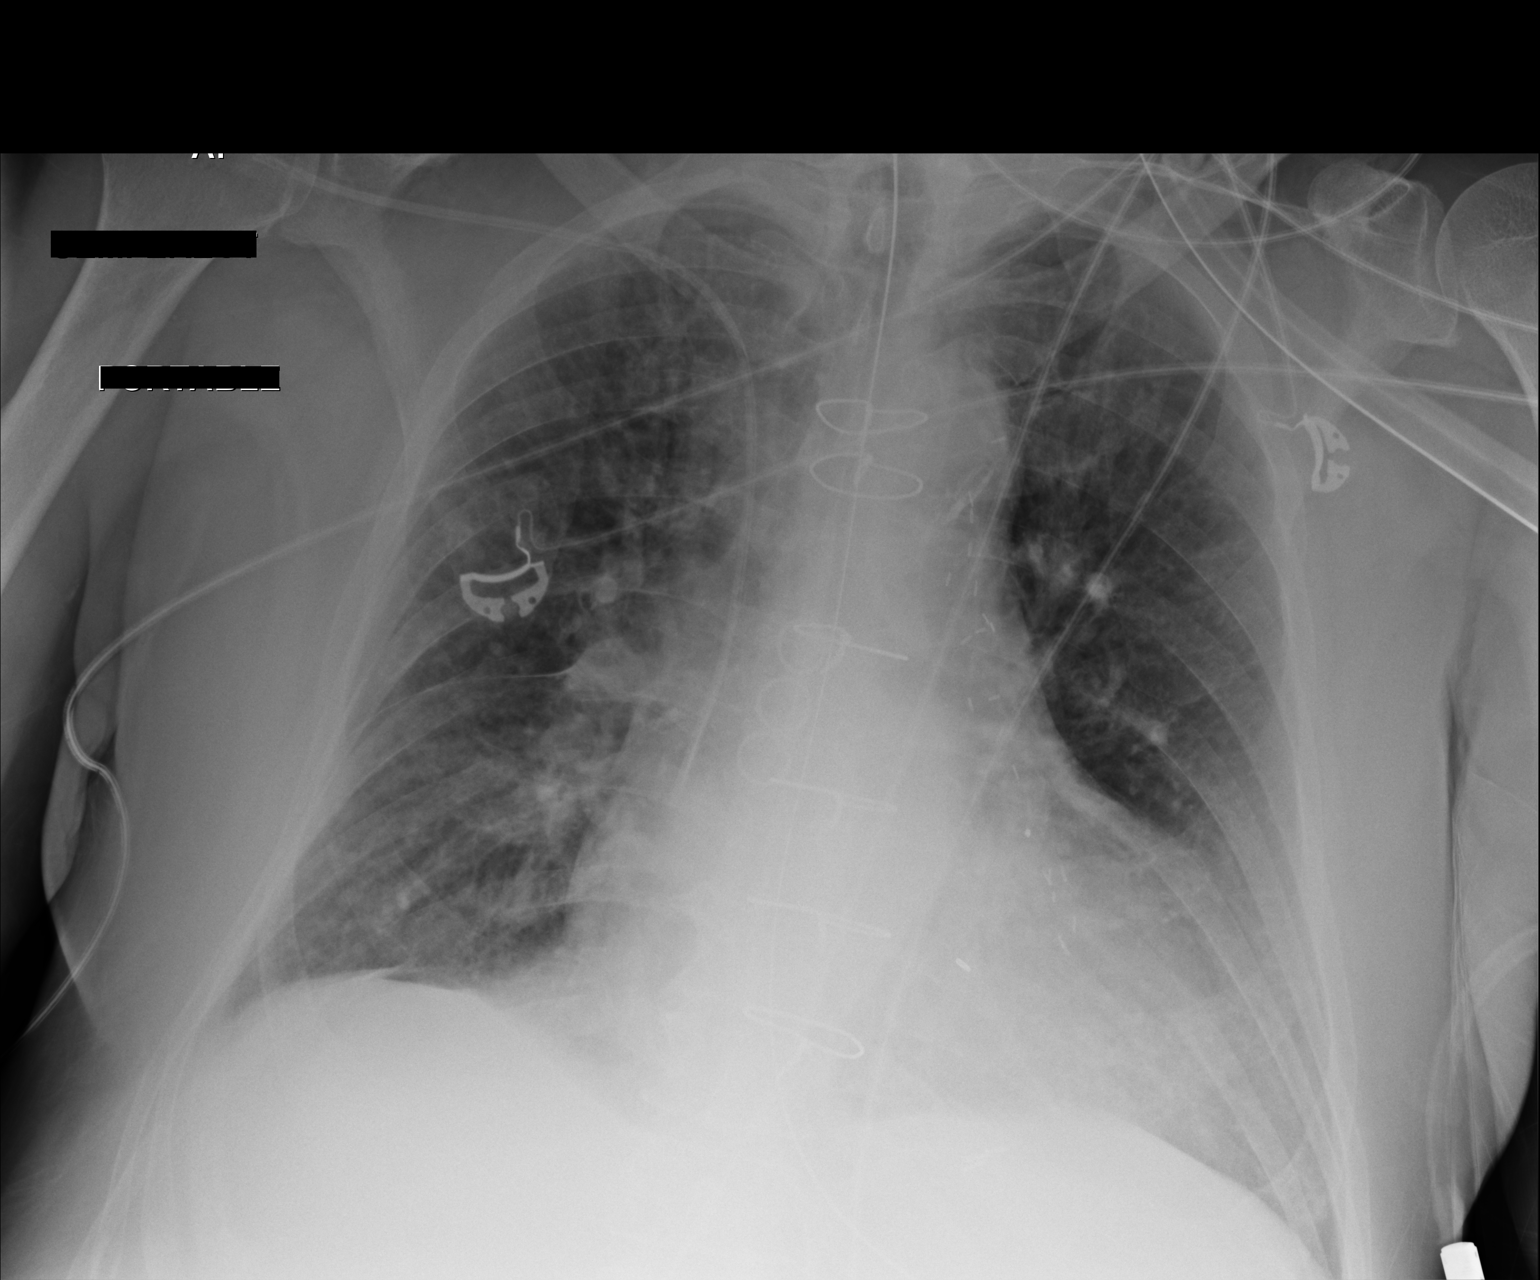

[1 of 1 positions shown; findings below may reference images not displayed]

FINDINGS: The lungs are well expanded. The pulmonary interstitial markings
remain increased but have improved. The cardiac silhouette is mildly
enlarged though stable. The pulmonary vascularity remains prominent
centrally with mild cephalization.

The endotracheal tube has been removed. The right subclavian venous
catheter tip projects over the distal third of the SVC. The
esophagogastric tube tip projects below the inferior margin of the
image. There are 6 intact sternal wires present from previous CABG.

A small amount of free air under the right hemidiaphragm is noted.
IMPRESSION: 1. Slight improvement in the appearance of the pulmonary
interstitium consistent with resolving interstitial edema. The
remaining support tubes and lines are in appropriate position.
2. There is a tiny crescentic lucency which may reflect free air
under the right hemidiaphragm. An acute abdominal series or
abdominal and pelvic CT scan would be useful.
3. These results were called by telephone at the time of
interpretation on 06/19/2014 at [DATE] to Kaylon Eligio, RN,who
verbally acknowledged these results.

## 2016-08-05 ENCOUNTER — Other Ambulatory Visit: Payer: Self-pay | Admitting: Internal Medicine

## 2016-08-05 DIAGNOSIS — R911 Solitary pulmonary nodule: Secondary | ICD-10-CM

## 2016-09-06 ENCOUNTER — Other Ambulatory Visit: Payer: Medicare Other

## 2016-09-06 ENCOUNTER — Ambulatory Visit (HOSPITAL_COMMUNITY)
Admission: RE | Admit: 2016-09-06 | Discharge: 2016-09-06 | Disposition: A | Discharge status: Home / Self Care | Payer: Medicare Other | Source: Ambulatory Visit | Attending: Internal Medicine | Admitting: Internal Medicine

## 2016-09-06 DIAGNOSIS — K8020 Calculus of gallbladder without cholecystitis without obstruction: Secondary | ICD-10-CM | POA: Diagnosis not present

## 2016-09-06 DIAGNOSIS — R911 Solitary pulmonary nodule: Secondary | ICD-10-CM | POA: Diagnosis present

## 2016-09-06 DIAGNOSIS — I712 Thoracic aortic aneurysm, without rupture: Secondary | ICD-10-CM | POA: Insufficient documentation

## 2016-09-06 DIAGNOSIS — D1779 Benign lipomatous neoplasm of other sites: Secondary | ICD-10-CM | POA: Diagnosis not present

## 2016-09-06 DIAGNOSIS — R918 Other nonspecific abnormal finding of lung field: Secondary | ICD-10-CM | POA: Insufficient documentation

## 2016-09-06 DIAGNOSIS — I700 Atherosclerosis of aorta: Secondary | ICD-10-CM | POA: Diagnosis not present

## 2016-09-07 NOTE — Progress Notes (Signed)
LMTCB

## 2016-09-13 NOTE — Progress Notes (Signed)
Spoke with pt and notified of results per Dr. Wert. Pt verbalized understanding and denied any questions. 

## 2016-12-06 ENCOUNTER — Ambulatory Visit: Payer: Medicare Other | Admitting: Family

## 2016-12-06 ENCOUNTER — Encounter (HOSPITAL_COMMUNITY): Payer: Medicare Other

## 2017-10-05 ENCOUNTER — Ambulatory Visit: Payer: Self-pay | Admitting: *Deleted

## 2017-12-29 NOTE — Telephone Encounter (Signed)
Opened in Error.

## 2018-06-02 ENCOUNTER — Encounter: Payer: Self-pay | Admitting: Cardiology

## 2018-06-02 ENCOUNTER — Ambulatory Visit (INDEPENDENT_AMBULATORY_CARE_PROVIDER_SITE_OTHER): Payer: No Typology Code available for payment source | Admitting: Cardiology

## 2018-06-02 VITALS — BP 152/60 | HR 63 | Ht 67.0 in | Wt 190.0 lb

## 2018-06-02 DIAGNOSIS — I6523 Occlusion and stenosis of bilateral carotid arteries: Secondary | ICD-10-CM | POA: Diagnosis not present

## 2018-06-02 DIAGNOSIS — E782 Mixed hyperlipidemia: Secondary | ICD-10-CM | POA: Diagnosis not present

## 2018-06-02 DIAGNOSIS — N184 Chronic kidney disease, stage 4 (severe): Secondary | ICD-10-CM

## 2018-06-02 DIAGNOSIS — I10 Essential (primary) hypertension: Secondary | ICD-10-CM

## 2018-06-02 DIAGNOSIS — I4891 Unspecified atrial fibrillation: Secondary | ICD-10-CM | POA: Diagnosis not present

## 2018-06-02 DIAGNOSIS — I2510 Atherosclerotic heart disease of native coronary artery without angina pectoris: Secondary | ICD-10-CM

## 2018-06-02 DIAGNOSIS — I5032 Chronic diastolic (congestive) heart failure: Secondary | ICD-10-CM

## 2018-06-02 DIAGNOSIS — I739 Peripheral vascular disease, unspecified: Secondary | ICD-10-CM

## 2018-06-02 NOTE — Patient Instructions (Addendum)
Medication Instructions:  Your physician recommends that you continue on your current medications as directed. Please refer to the Current Medication list given to you today.   Labwork: NONE  Testing/Procedures: NONE  Follow-Up: Your physician recommends that you schedule a follow-up appointment in: 2 MONTHS    Any Other Special Instructions Will Be Listed Below (If Applicable).  I WILL REQUEST RECORDS FROM [facility]  If you need a refill on your cardiac medications before your next appointment, please call your pharmacy.

## 2018-06-02 NOTE — Progress Notes (Signed)
Clinical Summary Barry Lawrence is a 73 y.o.male last seen by Dr Gwenlyn Found in 2017, most recently followed by cardiology at [facility]. This is our first visit together.    1. CAD - history of CABG in 1996 (LIMA-LAD, SVG OM1 and OM2 sequential) - no recent chest pain.  - recent SOB that is chronic for several months. Overall stable - compliant with meds    2. HTN - high K, off eplerenone. Had been on aldactone previously, had some gynecomastia.  - can have some low bp's at times. 90s/60s, would feel fatigued over 1 month ago - no recurrent low bp's. At home 130s/70s.   3. PAD - history of bilateral  iliac and bilateral SFA stents.  - history of aortobifemoral bypass Jan 2016 and left renal bypass  May need AAA duplex, LE arterial Dopplers, [place] ABIs, left renal artery [place] depending on when last checked at [facility]   3. Carotid stenosis - history of right CEA in 2009 - no recent neuro symptoms   4. AAA - history of repair  5. Renal artery stenosis - prior angiogram showed 90% left renal artery stenosis - history of left renal bypass at time of aortobifem in Jan 2016   6. PAF - no recent palpitations - coumadin levels followed at [place].    7. Hyperlipidemia - [facility]: TC 126 TG 220 HDL 27 LDL 55  8. CKD IV - [facility] indicate he is nearing [facility] - followed by Dr Hinda Lenis now, has follow up later this month   9. COPD  10. Chronic diastolic HF - notes report some history of chronic LE edema even with diuretics - compliant with diuretics     Past Medical History:  Diagnosis Date  . Abdominal aortic aneurysm ([place]) 01/28/10; 02/05/14   4.3x4.6cm;  now measuring 5.5 cm, status post aortobifemoral bypass grafting in January 2016 by Dr. problem along with left renal artery bypass  . Arthritis    in his back  . CAD (coronary artery disease)    myoview 04/21/11-normal, EF49%  . Carotid stenosis 01/15/08   right endarterectomy-Dr. Amedeo Plenty  . CKD (chronic kidney  disease), stage III   . Diabetes ([place])    BORDERLINE  . GERD (gastroesophageal reflux disease)   . Headache    uses [employer], states he is lessening what he is taking for prep. for surgery   . Hyperlipemia   . Hypertension    stress test- done 04/2014, followed by Dr. Gwenlyn Found  . Myocardial infarction 1996  . OSA on CPAP    CPAP q night , CPAP is through [place], states doesn't remember when he had the last study  . PAD (peripheral artery disease) ([place]) 06/12/09   a. 11/22/07 PTA & stenting right external iliac artery;bilateral iliac & PTI and stenting 1997;right ICA =>50% reduction,right SFA >50%,left ICA at stent => 50% reduction,left EIA => 50% reduction,left ATA occluded, left SFA mid > 49% reduction;  03/2014 Angio: LRA 90, patent L Iliac stent and distal RCIA stent, large saccular AAA.  [name] Pneumonia    hosp. as a child with pneumonia, not since   . Polycythemia    H/O  . S/P CABG (coronary artery bypass graft) 1996  . Tobacco abuse      Allergies  Allergen Reactions  . Niaspan [Niacin Er] Hives     Current Outpatient Medications  Medication Sig Dispense Refill  . albuterol (PROVENTIL HFA;VENTOLIN HFA) 108 (90 BASE) MCG/ACT inhaler Inhale  2 puffs into the lungs every 6 (six) hours as needed for wheezing or shortness of breath. 1 Inhaler 2  . amLODipine (NORVASC) 10 MG tablet Take 5 mg by mouth daily before breakfast. 1/2 tab    . atorvastatin (LIPITOR) 80 MG tablet Take 80 mg by mouth at bedtime.     . bisoprolol (ZEBETA) 5 MG tablet Take 5 mg by mouth daily.  0  . cholecalciferol (VITAMIN D) 1000 units tablet Take 1,000 Units by mouth daily.    . finasteride (PROSCAR) 5 MG tablet Take 5 mg by mouth daily.    . furosemide (LASIX) 40 MG tablet Take 80 mg by mouth 2 (two) times daily.     . hydrALAZINE (APRESOLINE) 10 MG tablet Take 10 mg by mouth 2 (two) times daily.    [name] omeprazole (PRILOSEC) 20 MG capsule Take 20 mg by mouth at bedtime.     . potassium chloride SA  (K-DUR,KLOR-CON) 20 MEQ tablet Take 20 mEq by mouth daily.    . tamsulosin (FLOMAX) 0.4 MG CAPS capsule Take 0.4 mg by mouth as directed. Take 2 tablets daily at night    . warfarin (COUMADIN) 5 MG tablet Take 1 tablet daily except 1/2 tablet on Mondays, Wednesdays and Fridays or as directed 30 tablet 4   No current facility-administered medications for this visit.      Past Surgical History:  Procedure Laterality Date  . ABDOMINAL AORTAGRAM N/A 04/01/2014   Procedure: ABDOMINAL Maxcine Ham;  Surgeon: [name], MD;  [facility] C[facility] LAB;  Service: Cardiovascular;  Laterality: N/A;  . AORTA - BILATERAL FEMORAL ARTERY BYPASS GRAFT N/A 05/30/2014   Procedure: AORTOBIFEMORAL BYPASS GRAFT;  Surgeon: Serafina Mitchell, MD;  Location: [place];  Service: Vascular;  Laterality: N/A;  . AORTIC/RENAL BYPASS Left 05/30/2014   Procedure: LEFT RENAL ARTERY BYPASS;  Surgeon: Serafina Mitchell, MD;  Location: [place];  Service: Vascular;  Laterality: Left;  . BACK SURGERY  1988   at [facility]  . CAROTID ENDARTERECTOMY Right 01/15/08  . COLONOSCOPY N/A 07/09/2013   Procedure: COLONOSCOPY;  Surgeon: Juanita Craver, MD;  Location: [facility] ENDOSCOPY;  Service: Endoscopy;  Laterality: N/A;  . CORONARY ARTERY BYPASS GRAFT  1996   LIMA to LAD, sequential vein to OM1 and 2 as well as acure marginal Haidan Nhan and diag Nathanael Krist  . EMPYEMA DRAINAGE Right 12/17/2014   Procedure: EMPYEMA DRAINAGE;  Surgeon: Gaye Pollack, MD;  Location: [place];  Service: Thoracic;  Laterality: Right;  . [place] SURGERY  1998  . pv angio  11/22/2007   PTA/ stenting of right common iliac artery with a 10x35mm Smart stent and postdilated with a 7x74mmpowerflex balloon; high grade calcified stenosis of the RICA  . PV angio  04/01/14   AAA, Lt renal artery stenosis, patent iliacs  . VIDEO ASSISTED THORACOSCOPY (VATS)/DECORTICATION Right 12/17/2014   Procedure: RIGHT VIDEO ASSISTED THORACOSCOPY (VATS);  Surgeon: Gaye Pollack, MD;  Location[facility] OR;  Service:  Thoracic;  Laterality: Right;     Allergies  Allergen Reactions  . Niaspan [Niacin Er] Hives      Family History  Problem Relation Age of Onset  . Heart failure Father      Social History Mr. Gage reports that he has been smoking cigarettes. He has a 56.00 pack-year smoking history. He has never used smokeless tobacco. Mr. Graves reports no history of alcohol use.   Review of Systems CONSTITUTIONAL:generalized fatigue HEENT: Eyes: No visual loss, blurred vision, double  vision or yellow sclerae.No hearing loss, sneezing, congestion, runny nose or sore throat.  SKIN: No rash or itching.  CARDIOVASCULAR: per hpi RESPIRATORY: No shortness of breath, cough or sputum.  GASTROINTESTINAL: No anorexia, nausea, vomiting or diarrhea. No abdominal pain or blood.  GENITOURINARY: No burning on urination, no polyuria NEUROLOGICAL: No headache, dizziness, syncope, paralysis, ataxia, numbness or tingling in the extremities. No change in bowel or bladder control.  MUSCULOSKELETAL: No muscle, back pain, joint pain or stiffness.  LYMPHATICS: No enlarged nodes. No history of splenectomy.  PSYCHIATRIC: No history of depression or anxiety.  ENDOCRINOLOGIC: No reports of sweating, cold or heat intolerance. No polyuria or polydipsia.  [name]   Physical Examination Vitals:   06/02/18 1121  BP: (!) 152/60  Pulse: 63  SpO2: 95%   Vitals:   06/02/18 1121  Weight: 190 lb (86.2 kg)  Height: 5\' 7"  (1.702 m)    Gen: resting comfortably, no acute distress HEENT: no scleral icterus, pupils equal round and reactive, no palptable cervical adenopathy,  CV: RRR, no m/r/g, no jvd Resp: Clear to auscultation bilaterally GI: abdomen is soft, non-tender, non-distended, normal bowel sounds, no hepatosplenomegaly MSK: extremities are warm, no edema.  Skin: warm, no rash Neuro:  no focal deficits Psych: appropriate affect   Diagnostic Studies - 04/2016 echo [facility]: normal LV function, severe diastolic  dysfunction, mod to severe pulm HTN,   [facility] Normal LV function, moderate diastolic dysfunction, mild AI, mod to severe pulm HTN   Assessment and Plan  1. CAD - no symptoms, continue current meds  2. HTN - reasonable control, continue current meds. LIkely will have significant shift once he started HD in near future  3. PAD - request recent studies from [place]. May need AAA duplex, LE arterial US/ABIs, renal artery, and carotids in near future   4. Carotid stenosis - continue medical therapy - likely repeat carotid US in near future  5. PAF - no recent symptoms, continue current meds including coumadin  6. Hyperlipidemia - continue statin, LDL at goal  7. CKD IV - limit nephrotoxic meds, close f/u with renal. Apparently he may be starting HD in very near future  8. Chronic diastolic HF - appears euvolemic, continue diuretisc   Extensive medical history involving multiple medical problems, along with extensive review of prior medical records from both [place] and the [place] led to extended consultation visit today over 40 minutes.       Arnoldo Lenis, M.D.

## 2018-08-02 ENCOUNTER — Ambulatory Visit (INDEPENDENT_AMBULATORY_CARE_PROVIDER_SITE_OTHER): Payer: No Typology Code available for payment source | Admitting: Cardiology

## 2018-08-02 ENCOUNTER — Other Ambulatory Visit: Payer: Self-pay

## 2018-08-02 ENCOUNTER — Encounter: Payer: Self-pay | Admitting: Cardiology

## 2018-08-02 VITALS — BP 134/58 | HR 58 | Ht 67.0 in | Wt 188.0 lb

## 2018-08-02 DIAGNOSIS — I2510 Atherosclerotic heart disease of native coronary artery without angina pectoris: Secondary | ICD-10-CM | POA: Diagnosis not present

## 2018-08-02 DIAGNOSIS — E782 Mixed hyperlipidemia: Secondary | ICD-10-CM | POA: Diagnosis not present

## 2018-08-02 DIAGNOSIS — I4891 Unspecified atrial fibrillation: Secondary | ICD-10-CM | POA: Diagnosis not present

## 2018-08-02 DIAGNOSIS — I6523 Occlusion and stenosis of bilateral carotid arteries: Secondary | ICD-10-CM | POA: Diagnosis not present

## 2018-08-02 NOTE — Progress Notes (Signed)
Clinical Summary Barry Lawrence is a 73 y.o.male seen today for follow up of the following medical problems.   1. CAD - history of CABG in 1996 (LIMA-LAD, SVG OM1 and OM2 sequential)   - no recent chest pain. - compliant with meds.   2. HTN - high K, off eplerenone. Had been on aldactone previously, had some gynecomastia.  - can have some low bp's at times. 90s/60s, would feel fatigued over 1 month ago - no recurrent low bp's. At home 130s/70s.   - compliant with meds.   3. PAD - history of bilateral  iliac and bilateral SFA stents.  - history of aortobifemoral bypass Jan 2016 and left renal bypass  May need AAA duplex, LE arterial Dopplers, [place] ABIs, left renal artery [place] depending on when last checked at [facility]   3. Carotid stenosis - history of right CEA in 2009 - no recent symptoms.    4. AAA - history of repair  5. Renal artery stenosis - prior angiogram showed 90% left renal artery stenosis - history of left renal bypass at time of aortobifem in Jan 2016   6. PAF - no recent palpitations  - no palpitations - INRs at the [place].   7. Hyperlipidemia - [facility]: TC 126 TG 220 HDL 27 LDL 55  8. CKD IV - [facility]ble [facility] indicate he is nearing dialysis - followed by Dr Hinda Lenis now, has follow up later this month  - last visit considering dialysis.  - already has fistula.  - diuretics per nephrology. He is on torsemide 80mg  qday. Taking 2 mg bid.    9. COPD  10. Chronic diastolic HF - notes report some history of chronic LE edema even with diuretics   - ongoing LE edema. Home weights are around 187 lbs and stable.  Past Medical History:  Diagnosis Date  . Abdominal aortic aneurysm ([place]) 01/28/10; 02/05/14   4.3x4.6cm;  now measuring 5.5 cm, status post aortobifemoral bypass grafting in January 2016 by Dr. problem along with left renal artery bypass  . Arthritis    in his back  . CAD (coronary artery disease)    myoview  04/21/11-normal, EF49%  . Carotid stenosis 01/15/08   right endarterectomy-Dr. Amedeo Plenty  . CKD (chronic kidney disease), stage III ([place])   . Diabetes ([place])    BORDERLINE  . GERD (gastroesophageal reflux disease)   . Headache    uses [employer], states he is lessening what he is taking for prep. for surgery   . Hyperlipemia   . Hypertension    stress test- done 04/2014, followed by Dr. Gwenlyn Found  . Myocardial infarction ([place]) 1996  . OSA on CPAP    CPAP q night , CPAP is through [place], states doesn't remember when he had the last study  . PAD (peripheral artery disease) ([place]) 06/12/09   a. 11/22/07 PTA & stenting right external iliac artery;bilateral iliac & PTI and stenting 1997;right ICA =>50% reduction,right SFA >50%,left ICA at stent => 50% reduction,left EIA => 50% reduction,left ATA occluded, left SFA mid > 49% reduction;  03/2014 Angio: LRA 90, patent L Iliac stent and distal RCIA stent, large saccular AAA.  [name] Pneumonia    hosp. as a child with pneumonia, not since   . Polycythemia    H/O  . S/P CABG (coronary artery bypass graft) 1996  . Tobacco abuse      Allergies  Allergen Reactions  . Niaspan [Niacin Er] Hives  Current Outpatient Medications  Medication Sig Dispense Refill  . albuterol (ACCUNEB) 0.63 MG/3ML nebulizer solution Take 1 ampule by nebulization every 6 (six) hours as needed for wheezing.    [name] amLODipine (NORVASC) 5 MG tablet Take 5 mg by mouth daily.    [name] atorvastatin (LIPITOR) 80 MG tablet Take 80 mg by mouth at bedtime.     . bisoprolol (ZEBETA) 5 MG tablet Take 5 mg by mouth daily.  0  . bumetanide (BUMEX) 2 MG tablet Take 2 mg by mouth 2 (two) times daily.    . calcitRIOL (ROCALTROL) 0.25 MCG capsule Take 0.25 mcg by mouth daily.    . finasteride (PROSCAR) 5 MG tablet Take 5 mg by mouth daily.    . hydrALAZINE (APRESOLINE) 25 MG tablet Take 75 mg by mouth 2 (two) times daily.    . magnesium oxide (MAG-OX) 400 MG tablet Take 400 mg by mouth 2 (two) times  daily.    [name] omeprazole (PRILOSEC) 20 MG capsule Take 20 mg by mouth at bedtime.     . tamsulosin (FLOMAX) 0.4 MG CAPS capsule Take 0.4 mg by mouth as directed. Take 2 tablets daily at night    . warfarin (COUMADIN) 5 MG tablet Take 1 tablet daily except 1/2 tablet on Mondays, Wednesdays and Fridays or as directed (Patient taking differently: Take 5 mg by mouth. Take 1 tablet daily except 1/2 tablet Wednesdays or as directed) 30 tablet 4   No current facility-administered medications for this visit.      Past Surgical History:  Procedure Laterality Date  . ABDOMINAL AORTAGRAM N/A 04/01/2014   Procedure: ABDOMINAL Maxcine Ham;  Surgeon: [name], MD;  Location[facility] CATH LAB;  Service: Cardiovascular;  Laterality: N/A;  . AORTA - BILATERAL FEMORAL ARTERY BYPASS GRAFT N/A 05/30/2014   Procedure: AORTOBIFEMORAL BYPASS GRAFT;  Surgeon: Serafina Mitchell, MD;  Location: [place];  Service: Vascular;  Laterality: N/A;  . AORTIC/RENAL BYPASS Left 05/30/2014   Procedure: LEFT RENAL ARTERY BYPASS;  Surgeon: Serafina Mitchell, MD;  Location: [place];  Service: Vascular;  Laterality: Left;  . BACK SURGERY  1988   at [facility]  . CAROTID ENDARTERECTOMY Right 01/15/08  . COLONOSCOPY N/A 07/09/2013   Procedure: COLONOSCOPY;  Surgeon: Juanita Craver, MD;  [facility] END[facility];  Service: Endoscopy;  Laterality: N/A;  . CORONARY ARTERY BYPASS GRAFT  1996   LIMA to LAD, sequential vein to OM1 and 2 as well as acure marginal Jarian Longoria and diag Jacelynn Hayton  . EMPYEMA DRAINAGE Right 12/17/2014   Procedure: EMPYEMA DRAINAGE;  Surgeon: Gaye Pollack, MD;  Location: [place];  Service: Thoracic;  Laterality: Right;  . [place] SURGERY  1998  . pv angio  11/22/2007   PTA/ stenting of right common iliac artery with a 10x37mm Smart stent and postdilated with a 7x55mmpowerflex balloon; high grade calcified stenosis of the RICA  . PV angio  04/01/14   AAA, Lt renal artery stenosis, patent iliacs  . VIDEO ASSISTED THORACOSCOPY  (VATS)/DECORTICATION Right 12/17/2014   Procedure: RIGHT VIDEO ASSISTED THORACOSCOPY (VATS);  Surgeon: Gaye Pollack, MD;  Location: [facility] OR;  Service: Thoracic;  Laterality: Right;     Allergies  Allergen Reactions  . Niaspan [Niacin Er] Hives      Family History  Problem Relation Age of Onset  . Heart failure Father      Social History Mr. Tullis reports that he has been smoking cigarettes. He has a 56.00 pack-year smoking history. He has never  used smokeless tobacco. Mr. Hayduk reports no history of alcohol use.   Review of Systems CONSTITUTIONAL: No weight loss, fever, chills, weakness or fatigue.  HEENT: Eyes: No visual loss, blurred vision, double vision or yellow sclerae.No hearing loss, sneezing, congestion, runny nose or sore throat.  SKIN: No rash or itching.  CARDIOVASCULAR: per hpi RESPIRATORY: No shortness of breath, cough or sputum.  GASTROINTESTINAL: No anorexia, nausea, vomiting or diarrhea. No abdominal pain or blood.  GENITOURINARY: No burning on urination, no polyuria NEUROLOGICAL: No headache, dizziness, syncope, paralysis, ataxia, numbness or tingling in the extremities. No change in bowel or bladder control.  MUSCULOSKELETAL: No muscle, back pain, joint pain or stiffness.  LYMPHATICS: No enlarged nodes. No history of splenectomy.  PSYCHIATRIC: No history of depression or anxiety.  ENDOCRINOLOGIC: No reports of sweating, cold or heat intolerance. No polyuria or polydipsia.  [name]   Physical Examination Today's Vitals   08/02/18 1059  BP: (!) 134/58  Pulse: (!) 58  SpO2: 97%  Weight: 188 lb (85.3 kg)  Height: 5\' 7"  (1.702 m)   Body mass index is 29.44 kg/m.  Gen: resting comfortably, no acute distress HEENT: no scleral icterus, pupils equal round and reactive, no palptable cervical adenopathy,  CV: RRR, no m/r/g, no jvd Resp: Clear to auscultation bilaterally GI: abdomen is soft, non-tender, non-distended, normal bowel sounds, no  hepatosplenomegaly MSK: extremities are warm, no edema.  Skin: warm, no rash Neuro:  no focal deficits Psych: appropriate affect   Diagnostic Studies  04/2016 echo at [facility]: normal LV function, severe diastolic dysfunction, mod to severe pulm HTN,   [facility] 2019 [facility] Normal LV function, moderate diastolic dysfunction, mild AI, mod to severe pulm HTN   Assessment and Plan  1. CAD - no recent symptoms, continue current meds  2. HTN - at goal, continue current meds. Pending HD in near future likely, may actually have to cut back on meds when his startes.   3. PAD - will work to coordinate his repeat imaing.    4. Carotid stenosis - continue medical therapy -no symptoms.   5. PAF - no symptoms, he will continue current meds  6. Hyperlipidemia - he will continue statin .  7. CKD IV - followed very closely by renal, according to patient very near requiring HD.   8. Chronic diastolic HF - defer diuretics to renal.          Arnoldo Lenis, M.D.

## 2018-08-02 NOTE — Patient Instructions (Signed)
Medication Instructions:   Your physician recommends that you continue on your current medications as directed. Please refer to the Current Medication list given to you today.  Labwork:  none  Testing/Procedures:  none  Follow-Up:  Your physician recommends that you schedule a follow-up appointment in: 4 months.   Any Other Special Instructions Will Be Listed Below (If Applicable).  If you need a refill on your cardiac medications before your next appointment, please call your pharmacy. 

## 2018-08-04 ENCOUNTER — Other Ambulatory Visit: Payer: Self-pay

## 2018-08-04 ENCOUNTER — Encounter (HOSPITAL_COMMUNITY): Payer: Self-pay | Admitting: Emergency Medicine

## 2018-08-04 ENCOUNTER — Emergency Department (HOSPITAL_COMMUNITY): Payer: No Typology Code available for payment source

## 2018-08-04 ENCOUNTER — Inpatient Hospital Stay (HOSPITAL_COMMUNITY)
Admission: EM | Admit: 2018-08-04 | Discharge: 2018-08-08 | DRG: 640 | Disposition: A | Discharge status: Home / Self Care | Payer: No Typology Code available for payment source | Attending: Internal Medicine | Admitting: Internal Medicine

## 2018-08-04 DIAGNOSIS — Z79899 Other long term (current) drug therapy: Secondary | ICD-10-CM

## 2018-08-04 DIAGNOSIS — R0602 Shortness of breath: Secondary | ICD-10-CM | POA: Insufficient documentation

## 2018-08-04 DIAGNOSIS — E8770 Fluid overload, unspecified: Principal | ICD-10-CM | POA: Diagnosis present

## 2018-08-04 DIAGNOSIS — I482 Chronic atrial fibrillation, unspecified: Secondary | ICD-10-CM | POA: Diagnosis present

## 2018-08-04 DIAGNOSIS — I959 Hypotension, unspecified: Secondary | ICD-10-CM | POA: Diagnosis not present

## 2018-08-04 DIAGNOSIS — N184 Chronic kidney disease, stage 4 (severe): Secondary | ICD-10-CM | POA: Diagnosis present

## 2018-08-04 DIAGNOSIS — N185 Chronic kidney disease, stage 5: Secondary | ICD-10-CM

## 2018-08-04 DIAGNOSIS — I2510 Atherosclerotic heart disease of native coronary artery without angina pectoris: Secondary | ICD-10-CM | POA: Diagnosis present

## 2018-08-04 DIAGNOSIS — E1122 Type 2 diabetes mellitus with diabetic chronic kidney disease: Secondary | ICD-10-CM | POA: Diagnosis present

## 2018-08-04 DIAGNOSIS — J811 Chronic pulmonary edema: Secondary | ICD-10-CM | POA: Diagnosis present

## 2018-08-04 DIAGNOSIS — Z7901 Long term (current) use of anticoagulants: Secondary | ICD-10-CM

## 2018-08-04 DIAGNOSIS — Z8249 Family history of ischemic heart disease and other diseases of the circulatory system: Secondary | ICD-10-CM

## 2018-08-04 DIAGNOSIS — I110 Hypertensive heart disease with heart failure: Secondary | ICD-10-CM | POA: Diagnosis not present

## 2018-08-04 DIAGNOSIS — K219 Gastro-esophageal reflux disease without esophagitis: Secondary | ICD-10-CM | POA: Diagnosis present

## 2018-08-04 DIAGNOSIS — I5020 Unspecified systolic (congestive) heart failure: Secondary | ICD-10-CM

## 2018-08-04 DIAGNOSIS — I5033 Acute on chronic diastolic (congestive) heart failure: Secondary | ICD-10-CM | POA: Diagnosis not present

## 2018-08-04 DIAGNOSIS — T50905A Adverse effect of unspecified drugs, medicaments and biological substances, initial encounter: Secondary | ICD-10-CM

## 2018-08-04 DIAGNOSIS — E1151 Type 2 diabetes mellitus with diabetic peripheral angiopathy without gangrene: Secondary | ICD-10-CM | POA: Diagnosis present

## 2018-08-04 DIAGNOSIS — I132 Hypertensive heart and chronic kidney disease with heart failure and with stage 5 chronic kidney disease, or end stage renal disease: Secondary | ICD-10-CM | POA: Diagnosis present

## 2018-08-04 DIAGNOSIS — R001 Bradycardia, unspecified: Secondary | ICD-10-CM | POA: Diagnosis present

## 2018-08-04 DIAGNOSIS — J449 Chronic obstructive pulmonary disease, unspecified: Secondary | ICD-10-CM | POA: Diagnosis present

## 2018-08-04 DIAGNOSIS — I714 Abdominal aortic aneurysm, without rupture, unspecified: Secondary | ICD-10-CM | POA: Diagnosis present

## 2018-08-04 DIAGNOSIS — I252 Old myocardial infarction: Secondary | ICD-10-CM

## 2018-08-04 DIAGNOSIS — D631 Anemia in chronic kidney disease: Secondary | ICD-10-CM | POA: Diagnosis present

## 2018-08-04 DIAGNOSIS — G4733 Obstructive sleep apnea (adult) (pediatric): Secondary | ICD-10-CM | POA: Diagnosis present

## 2018-08-04 DIAGNOSIS — E785 Hyperlipidemia, unspecified: Secondary | ICD-10-CM | POA: Diagnosis present

## 2018-08-04 DIAGNOSIS — J9601 Acute respiratory failure with hypoxia: Secondary | ICD-10-CM | POA: Diagnosis present

## 2018-08-04 DIAGNOSIS — T447X5A Adverse effect of beta-adrenoreceptor antagonists, initial encounter: Secondary | ICD-10-CM | POA: Diagnosis present

## 2018-08-04 DIAGNOSIS — Z9981 Dependence on supplemental oxygen: Secondary | ICD-10-CM

## 2018-08-04 DIAGNOSIS — F17210 Nicotine dependence, cigarettes, uncomplicated: Secondary | ICD-10-CM | POA: Diagnosis present

## 2018-08-04 DIAGNOSIS — Z951 Presence of aortocoronary bypass graft: Secondary | ICD-10-CM

## 2018-08-04 DIAGNOSIS — N400 Enlarged prostate without lower urinary tract symptoms: Secondary | ICD-10-CM | POA: Diagnosis present

## 2018-08-04 LAB — CBC WITH DIFFERENTIAL/PLATELET
Abs Immature Granulocytes: 0.05 10*3/uL (ref 0.00–0.07)
Basophils Absolute: 0.1 10*3/uL (ref 0.0–0.1)
Basophils Relative: 1 %
Eosinophils Absolute: 0.1 10*3/uL (ref 0.0–0.5)
Eosinophils Relative: 1 %
HCT: 44.7 % (ref 39.0–52.0)
Hemoglobin: 14 g/dL (ref 13.0–17.0)
Immature Granulocytes: 0 %
Lymphocytes Relative: 12 %
Lymphs Abs: 1.5 10*3/uL (ref 0.7–4.0)
MCH: 29.9 pg (ref 26.0–34.0)
MCHC: 31.3 g/dL (ref 30.0–36.0)
MCV: 95.5 fL (ref 80.0–100.0)
Monocytes Absolute: 0.9 10*3/uL (ref 0.1–1.0)
Monocytes Relative: 7 %
Neutro Abs: 10.5 10*3/uL — ABNORMAL HIGH (ref 1.7–7.7)
Neutrophils Relative %: 79 %
Platelets: 226 10*3/uL (ref 150–400)
RBC: 4.68 MIL/uL (ref 4.22–5.81)
RDW: 14.1 % (ref 11.5–15.5)
WBC: 13.2 10*3/uL — ABNORMAL HIGH (ref 4.0–10.5)
nRBC: 0 % (ref 0.0–0.2)

## 2018-08-04 LAB — COMPREHENSIVE METABOLIC PANEL
ALT: 12 U/L (ref 0–44)
AST: 13 U/L — ABNORMAL LOW (ref 15–41)
Albumin: 4.2 g/dL (ref 3.5–5.0)
Alkaline Phosphatase: 72 U/L (ref 38–126)
Anion gap: 11 (ref 5–15)
BUN: 67 mg/dL — ABNORMAL HIGH (ref 8–23)
CO2: 24 mmol/L (ref 22–32)
Calcium: 9.4 mg/dL (ref 8.9–10.3)
Chloride: 98 mmol/L (ref 98–111)
Creatinine, Ser: 4.09 mg/dL — ABNORMAL HIGH (ref 0.61–1.24)
GFR calc Af Amer: 16 mL/min — ABNORMAL LOW (ref >60)
GFR calc non Af Amer: 14 mL/min — ABNORMAL LOW (ref >60)
Glucose, Bld: 164 mg/dL — ABNORMAL HIGH (ref 70–99)
Potassium: 4.1 mmol/L (ref 3.5–5.1)
Sodium: 133 mmol/L — ABNORMAL LOW (ref 135–145)
Total Bilirubin: 0.4 mg/dL (ref 0.3–1.2)
Total Protein: 7.1 g/dL (ref 6.5–8.1)

## 2018-08-04 LAB — BRAIN NATRIURETIC PEPTIDE: B Natriuretic Peptide: 815 pg/mL — ABNORMAL HIGH (ref 0.0–100.0)

## 2018-08-04 LAB — TROPONIN I
Troponin I: 0.03 ng/mL (ref <0.03)
Troponin I: 0.03 ng/mL (ref <0.03)

## 2018-08-04 LAB — PROTIME-INR
INR: 1.6 — ABNORMAL HIGH (ref 0.8–1.2)
Prothrombin Time: 19.2 seconds — ABNORMAL HIGH (ref 11.4–15.2)

## 2018-08-04 MED ORDER — SODIUM CHLORIDE 0.9% FLUSH
3.0000 mL | Freq: Two times a day (BID) | INTRAVENOUS | Status: DC
  Administered 2018-08-04 – 2018-08-08 (×7): 3 mL via INTRAVENOUS

## 2018-08-04 MED ORDER — FINASTERIDE 5 MG PO TABS
5.0000 mg | ORAL_TABLET | Freq: Every day | ORAL | Status: DC
  Administered 2018-08-05 – 2018-08-07 (×3): 5 mg via ORAL
  Filled 2018-08-04 (×3): qty 1

## 2018-08-04 MED ORDER — SODIUM CHLORIDE 0.9% FLUSH: 3.0000 mL | INTRAVENOUS | Status: DC | PRN

## 2018-08-04 MED ORDER — ATORVASTATIN CALCIUM 40 MG PO TABS
80.0000 mg | ORAL_TABLET | Freq: Every day | ORAL | Status: DC
  Administered 2018-08-04 – 2018-08-07 (×4): 80 mg via ORAL
  Filled 2018-08-04 (×4): qty 2

## 2018-08-04 MED ORDER — HYDRALAZINE HCL 25 MG PO TABS
75.0000 mg | ORAL_TABLET | Freq: Two times a day (BID) | ORAL | Status: DC
  Administered 2018-08-06 – 2018-08-08 (×3): 75 mg via ORAL
  Filled 2018-08-04 (×7): qty 3

## 2018-08-04 MED ORDER — TAMSULOSIN HCL 0.4 MG PO CAPS
0.8000 mg | ORAL_CAPSULE | Freq: Every day | ORAL | Status: DC
  Administered 2018-08-04 – 2018-08-07 (×4): 0.8 mg via ORAL
  Filled 2018-08-04 (×4): qty 2

## 2018-08-04 MED ORDER — BISOPROLOL FUMARATE 5 MG PO TABS
5.0000 mg | ORAL_TABLET | Freq: Every day | ORAL | Status: DC
  Filled 2018-08-04: qty 1

## 2018-08-04 MED ORDER — SODIUM CHLORIDE 0.9 % IV SOLN: 250.0000 mL | INTRAVENOUS | Status: DC | PRN

## 2018-08-04 MED ORDER — FUROSEMIDE 10 MG/ML IJ SOLN
80.0000 mg | Freq: Two times a day (BID) | INTRAMUSCULAR | Status: DC
  Administered 2018-08-05: 80 mg via INTRAVENOUS
  Filled 2018-08-04: qty 8

## 2018-08-04 MED ORDER — ALBUTEROL SULFATE (2.5 MG/3ML) 0.083% IN NEBU
5.0000 mg | INHALATION_SOLUTION | Freq: Once | RESPIRATORY_TRACT | Status: AC
  Administered 2018-08-04: 5 mg via RESPIRATORY_TRACT
  Filled 2018-08-04: qty 6

## 2018-08-04 MED ORDER — AMLODIPINE BESYLATE 5 MG PO TABS
5.0000 mg | ORAL_TABLET | Freq: Every day | ORAL | Status: DC
  Filled 2018-08-04: qty 1

## 2018-08-04 MED ORDER — ALBUTEROL SULFATE (2.5 MG/3ML) 0.083% IN NEBU: 3.0000 mL | INHALATION_SOLUTION | Freq: Four times a day (QID) | RESPIRATORY_TRACT | Status: DC | PRN

## 2018-08-04 MED ORDER — MAGNESIUM OXIDE 400 (241.3 MG) MG PO TABS
400.0000 mg | ORAL_TABLET | Freq: Two times a day (BID) | ORAL | Status: DC
  Administered 2018-08-04 – 2018-08-08 (×8): 400 mg via ORAL
  Filled 2018-08-04 (×8): qty 1

## 2018-08-04 MED ORDER — FUROSEMIDE 10 MG/ML IJ SOLN
80.0000 mg | Freq: Once | INTRAMUSCULAR | Status: AC
  Administered 2018-08-04: 80 mg via INTRAVENOUS
  Filled 2018-08-04: qty 8

## 2018-08-04 MED ORDER — WARFARIN SODIUM 5 MG PO TABS: 5.0000 mg | ORAL_TABLET | Freq: Once | ORAL | Status: DC

## 2018-08-04 MED ORDER — CALCITRIOL 0.25 MCG PO CAPS
0.2500 ug | ORAL_CAPSULE | Freq: Every day | ORAL | Status: DC
  Administered 2018-08-05 – 2018-08-08 (×4): 0.25 ug via ORAL
  Filled 2018-08-04 (×4): qty 1

## 2018-08-04 NOTE — ED Triage Notes (Signed)
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 A fib  Here for eval

## 2018-08-04 NOTE — ED Notes (Addendum)
ED TO INPATIENT HANDOFF REPORT  ED Nurse Name and Phone #: Barbaraann Faster [phone]  S Name/Age/Gender Barry Lawrence 73 y.o. male Room/Bed: APA01/APA01  Code Status   Code Status: Full Code  Home/SNF/Other Home Patient oriented to: self Is this baseline? 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 A fib  Here for eval    Allergies Allergies  Allergen Reactions  . Niaspan [Niacin Er] Hives    Level of Care/Admitting Diagnosis ED Disposition    ED Disposition Condition [facility] Area: [facility] [[id number]]  Level of Care: Med-Surg [16]  Diagnosis: SOB (shortness of breath) [509326]  Admitting Physician: Phillips Grout [[id number]]  Attending Physician: Derrill Kay A [[id number]]  PT Class (Do Not Modify): Observation [104]  PT Acc Code (Do Not Modify): Observation [10022]       B Medical/Surgery History Past Medical History:  Diagnosis Date  . Abdominal aortic aneurysm ([place]) 01/28/10; 02/05/14   4.3x4.6cm;  now measuring 5.5 cm, status post aortobifemoral bypass grafting in January 2016 by Dr. problem along with left renal artery bypass  . Arthritis    in his back  . CAD (coronary artery disease)    myoview 04/21/11-normal, EF49%  . Carotid stenosis 01/15/08   right endarterectomy-Dr. Amedeo Plenty  . CKD (chronic kidney disease), stage III ([place])   . Diabetes ([place])    BORDERLINE  . GERD (gastroesophageal reflux disease)   . Headache    uses [employer], states he is lessening what he is taking for prep. for surgery   . Hyperlipemia   . Hypertension    stress test- done 04/2014, followed by Dr. Gwenlyn Found  . Myocardial infarction ([place]) 1996  . OSA on CPAP    CPAP q night , CPAP is through [place], states doesn't remember when he had the last study  . PAD (peripheral artery disease) ([place]) 06/12/09   a. 11/22/07 PTA &  stenting right external iliac artery;bilateral iliac & PTI and stenting 1997;right ICA =>50% reduction,right SFA >50%,left ICA at stent => 50% reduction,left EIA => 50% reduction,left ATA occluded, left SFA mid > 49% reduction;  03/2014 Angio: LRA 90, patent L Iliac stent and distal RCIA stent, large saccular AAA.  [name] Pneumonia    hosp. as a child with pneumonia, not since   . Polycythemia    H/O  . S/P CABG (coronary artery bypass graft) 1996  . Tobacco abuse    Past Surgical History:  Procedure Laterality Date  . ABDOMINAL AORTAGRAM N/A 04/01/2014   Procedure: ABDOMINAL Maxcine Ham;  Surgeon: [name], MD;  Location: [facility] CATH LAB;  Service: Cardiovascular;  Laterality: N/A;  . AORTA - BILATERAL FEMORAL ARTERY BYPASS GRAFT N/A 05/30/2014   Procedure: AORTOBIFEMORAL BYPASS GRAFT;  Surgeon: Serafina Mitchell, MD;  Location: [place];  Service: Vascular;  Laterality: N/A;  . AORTIC/RENAL BYPASS Left 05/30/2014   Procedure: LEFT RENAL ARTERY BYPASS;  Surgeon: Serafina Mitchell, MD;  Location: [place];  Service: Vascular;  Laterality: Left;  . BACK SURGERY  1988   at [facility]  . CAROTID ENDARTERECTOMY Right 01/15/08  . COLONOSCOPY N/A 07/09/2013   Procedure: COLONOSCOPY;  Surgeon: Juanita Craver, MD;  [facility];  Service: Endoscopy;  Laterality: N/A;  . CORONARY ARTERY BYPASS GRAFT  1996   LIMA to  LAD, sequential vein to OM1 and 2 as well as acure marginal branch and diag branch  . EMPYEMA DRAINAGE Right 12/17/2014   Procedure: EMPYEMA DRAINAGE;  Surgeon: Gaye Pollack, MD;  Location: [place];  Service: Thoracic;  Laterality: Right;  . [place] SURGERY  1998  . pv angio  11/22/2007   PTA/ stenting of right common iliac artery with a 10x70mm Smart stent and postdilated with a 7x66mmpowerflex balloon; high grade calcified stenosis of the RICA  . PV angio  04/01/14   AAA, Lt renal artery stenosis, patent iliacs  . VIDEO ASSISTED THORACOSCOPY (VATS)/DECORTICATION Right 12/17/2014   Procedure: RIGHT  VIDEO ASSISTED THORACOSCOPY (VATS);  Surgeon: Gaye Pollack, MD;  Location: [facility] OR;  Service: Thoracic;  Laterality: Right;     A IV Location/Drains/Wounds Patient Lines/Drains/Airways Status   Active Line/Drains/Airways    Name:   Placement date:   Placement time:   Site:   Days:   Peripheral IV 12/17/14 Right Forearm   12/17/14    1246    Forearm   1326   Peripheral IV 12/17/14 Right Antecubital   12/17/14    1310    Antecubital   1326   Peripheral IV 08/04/18 Left Antecubital   08/04/18    1955    Antecubital   less than 1   Incision (Closed) 05/30/14 Groin Right   05/30/14    0937     1527   Incision (Closed) 05/30/14 Groin Left   05/30/14    0937     1527   Incision (Closed) 12/17/14 Chest Right   12/17/14    1430     1326   Pressure Ulcer 06/24/14 Stage I -  Intact skin with non-blanchable redness of a localized area usually over a bony prominence.   06/24/14    -     1502          Intake/Output Last 24 hours No intake or output data in the 24 hours ending 08/04/18 2234  Labs/Imaging Results for orders placed or performed during the hospital encounter of 08/04/18 (from the past 48 hour(s))  CBC with Differential     Status: Abnormal   Collection Time: 08/04/18  7:48 PM  Result Value Ref Range   WBC 13.2 (H) 4.0 - 10.5 K/uL   RBC 4.68 4.22 - 5.81 MIL/uL   Hemoglobin 14.0 13.0 - 17.0 g/dL   HCT 44.7 39.0 - 52.0 %   MCV 95.5 80.0 - 100.0 fL   MCH 29.9 26.0 - 34.0 pg   MCHC 31.3 30.0 - 36.0 g/dL   RDW 14.1 11.5 - 15.5 %   Platelets 226 150 - 400 K/uL   nRBC 0.0 0.0 - 0.2 %   Neutrophils Relative % 79 %   Neutro Abs 10.5 (H) 1.7 - 7.7 K/uL   Lymphocytes Relative 12 %   Lymphs Abs 1.5 0.7 - 4.0 K/uL   Monocytes Relative 7 %   Monocytes Absolute 0.9 0.1 - 1.0 K/uL   Eosinophils Relative 1 %   Eosinophils Absolute 0.1 0.0 - 0.5 K/uL   Basophils Relative 1 %   Basophils Absolute 0.1 0.0 - 0.1 K/uL   Immature Granulocytes 0 %   Abs Immature Granulocytes 0.05 0.00 - 0.07  K/uL    Comment: Performed at [facility], [address] [place], [place] [address]  Comprehensive metabolic panel     Status: Abnormal   Collection Time: 08/04/18  7:48 PM  Result Value Ref Range  Sodium 133 (L) 135 - 145 mmol/L   Potassium 4.1 3.5 - 5.1 mmol/L   Chloride 98 98 - 111 mmol/L   CO2 24 22 - 32 mmol/L   Glucose, Bld 164 (H) 70 - 99 mg/dL   BUN 67 (H) 8 - 23 mg/dL   Creatinine, Ser 4.09 (H) 0.61 - 1.24 mg/dL   Calcium 9.4 8.9 - 10.3 mg/dL   Total Protein 7.1 6.5 - 8.1 g/dL   Albumin 4.2 3.5 - 5.0 g/dL   AST 13 (L) 15 - 41 U/L   ALT 12 0 - 44 U/L   Alkaline Phosphatase 72 38 - 126 U/L   Total Bilirubin 0.4 0.3 - 1.2 mg/dL   GFR calc non Af Amer 14 (L) >60 mL/min   GFR calc Af Amer 16 (L) >60 mL/min   Anion gap 11 5 - 15    Comment: Performed at [facility], [address] [place], [place] [address]  Brain natriuretic peptide     Status: Abnormal   Collection Time: 08/04/18  7:48 PM  Result Value Ref Range   B Natriuretic Peptide 815.0 (H) 0.0 - 100.0 pg/mL    Comment: Performed at [facility], [address], [place], [place] [address]  Troponin I - ONCE - STAT     Status: Abnormal   Collection Time: 08/04/18  7:48 PM  Result Value Ref Range   Troponin I 0.03 (HH) <0.03 ng/mL    Comment: CRITICAL RESULT CALLED TO, READ BACK BY AND VERIFIED WITH: Andrya Roppolo,L ON 08/04/18 AT 2055 BY LOY,C Performed at [facility], [address] [place], [place] [address]   Protime-INR     Status: Abnormal   Collection Time: 08/04/18  7:48 PM  Result Value Ref Range   Prothrombin Time 19.2 (H) 11.4 - 15.2 seconds   INR 1.6 (H) 0.8 - 1.2    Comment: (NOTE) INR goal varies based on device and disease states. Performed at [facility], [address], [place], [place] [address]    Dg Chest 2 View  Result Date: 08/04/2018 CLINICAL DATA:  Hypotensive, shortness of breath. History of end-stage renal disease, RIGHT VATS for empyema. EXAM: CHEST - 2 VIEW COMPARISON:  CT chest September 06, 2016 FINDINGS: Cardiac silhouette is moderately enlarged. Calcified aortic arch. Status post median sternotomy for CABG. Pulmonary vascular congestion without pleural effusion or focal consolidation. No pneumothorax. Linear calcifications in the neck are likely vascular. IMPRESSION: 1. Moderate cardiomegaly and pulmonary vascular congestion. No acute pulmonary process. 2.  Aortic Atherosclerosis (ICD10-I70.0). Electronically Signed   By: Elon Alas M.D.   On: 08/04/2018 19:55    Pending Labs Unresulted Labs (From admission, onward)    Start     Ordered   08/05/18 4970  Basic metabolic panel  Tomorrow morning,   R     08/04/18 2219   08/05/18 0500  CBC  Tomorrow morning,   R     08/04/18 2219   08/05/18 0500  Protime-INR  Daily,   R     08/04/18 2232          Vitals/Pain Today's Vitals   08/04/18 2100 08/04/18 2130 08/04/18 2200 08/04/18 2230  BP: (!) 120/55 (!) 127/56 (!) 123/55 (!) 113/49  Pulse: (!) 47 (!) 58 (!) 46 60  Resp: 16 (!) 22 16 19   Temp:      TempSrc:      SpO2: 100% 98% 97% 97%  Weight:      Height:  PainSc:        Isolation Precautions No active isolations  Medications Medications  atorvastatin (LIPITOR) tablet 80 mg (has no administration in time range)  bisoprolol (ZEBETA) tablet 5 mg (has no administration in time range)  tamsulosin (FLOMAX) capsule 0.8 mg (has no administration in time range)  finasteride (PROSCAR) tablet 5 mg (has no administration in time range)  warfarin (COUMADIN) tablet 5 mg (has no administration in time range)  albuterol (PROVENTIL) (2.5 MG/3ML) 0.083% nebulizer solution 3 mL (has no administration in time range)  amLODipine (NORVASC) tablet 5 mg (has no administration in time range)  hydrALAZINE (APRESOLINE) tablet 75 mg (has no administration in time range)  calcitRIOL (ROCALTROL) capsule 0.25 mcg (has no administration in time range)  magnesium oxide (MAG-OX) tablet 400 mg (has no administration in time range)   sodium chloride flush (NS) 0.9 % injection 3 mL (has no administration in time range)  sodium chloride flush (NS) 0.9 % injection 3 mL (has no administration in time range)  0.9 %  sodium chloride infusion (has no administration in time range)  furosemide (LASIX) injection 80 mg (has no administration in time range)  albuterol (PROVENTIL) (2.5 MG/3ML) 0.083% nebulizer solution 5 mg (5 mg Nebulization Given 08/04/18 2056)  furosemide (LASIX) injection 80 mg (80 mg Intravenous Given 08/04/18 2228)    Mobility walks     Focused Assessments Cardiac Assessment Handoff:    Lab Results  Component Value Date   CKTOTAL 37 06/15/2014   CKMB 1.3 06/15/2014   TROPONINI 0.03 (HH) 08/04/2018   No results found for: DDIMER Does the Patient currently have chest pain? No     R Recommendations: See Admitting Provider Note  Report given to: Heather RN  Additional Notes:

## 2018-08-04 NOTE — ED Provider Notes (Signed)
[facility] EMERGENCY DEPARTMENT Provider Note   CSN: [id number] Arrival date & time: 08/04/18  1920    History   Chief Complaint Chief Complaint  Patient presents with  . Shortness of Breath  . ESRD    HPI Barry Lawrence is a 73 y.o. male.     The patient complains of shortness of breath for the last couple weeks worse when he lays down.  He also had chest pain today for couple hours.  No pain now  The history is provided by the patient. No language interpreter was used.  Shortness of Breath  Severity:  Moderate Onset quality:  Sudden Timing:  Constant Progression:  Worsening Chronicity:  New Context: activity   Relieved by:  Nothing Worsened by:  Nothing Ineffective treatments:  None tried Associated symptoms: chest pain   Associated symptoms: no abdominal pain, no cough, no headaches and no rash   Risk factors: no recent alcohol use     Past Medical History:  Diagnosis Date  . Abdominal aortic aneurysm ([place]) 01/28/10; 02/05/14   4.3x4.6cm;  now measuring 5.5 cm, status post aortobifemoral bypass grafting in January 2016 by Dr. problem along with left renal artery bypass  . Arthritis    in his back  . CAD (coronary artery disease)    myoview 04/21/11-normal, EF49%  . Carotid stenosis 01/15/08   right endarterectomy-Dr. Amedeo Plenty  . CKD (chronic kidney disease), stage III ([place])   . Diabetes ([place])    BORDERLINE  . GERD (gastroesophageal reflux disease)   . Headache    uses [employer], states he is lessening what he is taking for prep. for surgery   . Hyperlipemia   . Hypertension    stress test- done 04/2014, followed by Dr. Gwenlyn Found  . Myocardial infarction ([place]) 1996  . OSA on CPAP    CPAP q night , CPAP is through [place], states doesn't remember when he had the last study  . PAD (peripheral artery disease) ([place]) 06/12/09   a. 11/22/07 PTA & stenting right external iliac artery;bilateral iliac & PTI and stenting 1997;right ICA =>50% reduction,right SFA >50%,left ICA  at stent => 50% reduction,left EIA => 50% reduction,left ATA occluded, left SFA mid > 49% reduction;  03/2014 Angio: LRA 90, patent L Iliac stent and distal RCIA stent, large saccular AAA.  [name] Pneumonia    hosp. as a child with pneumonia, not since   . Polycythemia    H/O  . S/P CABG (coronary artery bypass graft) 1996  . Tobacco abuse     Patient Active Problem List   Diagnosis Date Noted  . SOB (shortness of breath) 08/04/2018  . Pleural effusion   . Parapneumonic effusion 12/12/2014  . Pneumonia due to Klebsiella pneumoniae ([place])   . Acute on chronic diastolic heart failure ([place])   . Sepsis ([place]) 11/30/2014  . CAP (community acquired pneumonia) 11/29/2014  . Chronic diastolic CHF (congestive heart failure) ([place]) 11/29/2014  . Cellulitis of left lower extremity 10/06/2014  . Solitary pulmonary nodule 09/04/2014  . Warfarin anticoagulation   . Hypokalemia 06/15/2014  . Wound infection 06/15/2014  . Encounter for central line placement   . HCAP (healthcare-associated pneumonia)   . AAA (abdominal aortic aneurysm) ([place]) 05/30/2014  . Acute respiratory failure with hypoxia ([place])   . Post-operative state   . Respiratory failure ([place])   . Thoracic ascending aortic aneurysm ([place]) 03/31/2014  . CKD (chronic kidney disease), stage IV ([place])   . Sleep apnea   . Cigarette  smoker 03/29/2014  . COPD GOLD II still smoking  03/26/2014  . Abdominal aortic aneurysm without rupture ([place]) 03/18/2014  . Hyperlipidemia 02/16/2013  . CAD (coronary artery disease) 07/15/2012  . GERD (gastroesophageal reflux disease) 07/15/2012  . Carotid artery disease   . Hypertensive heart disease   . PAD (peripheral artery disease) ([place]) 06/12/2009    Past Surgical History:  Procedure Laterality Date  . ABDOMINAL AORTAGRAM N/A 04/01/2014   Procedure: ABDOMINAL Maxcine Ham;  Surgeon: [name], MD;  [facility];  Service: Cardiovascular;  Laterality: N/A;  . AORTA - BILATERAL FEMORAL ARTERY  BYPASS GRAFT N/A 05/30/2014   Procedure: AORTOBIFEMORAL BYPASS GRAFT;  Surgeon: Serafina Mitchell, MD;  Location: [place];  Service: Vascular;  Laterality: N/A;  . AORTIC/RENAL BYPASS Left 05/30/2014   Procedure: LEFT RENAL ARTERY BYPASS;  Surgeon: Serafina Mitchell, MD;  Location: [place];  Service: Vascular;  Laterality: Left;  . BACK SURGERY  1988   at [facility]  . CAROTID ENDARTERECTOMY Right 01/15/08  . COLONOSCOPY N/A 07/09/2013   Procedure: COLONOSCOPY;  Surgeon: Juanita Craver, MD;  Location: [facility];  Service: Endoscopy;  Laterality: N/A;  . CORONARY ARTERY BYPASS GRAFT  1996   LIMA to LAD, sequential vein to OM1 and 2 as well as acure marginal branch and diag branch  . EMPYEMA DRAINAGE Right 12/17/2014   Procedure: EMPYEMA DRAINAGE;  Surgeon: Gaye Pollack, MD;  Location: [place];  Service: Thoracic;  Laterality: Right;  . [place] SURGERY  1998  . pv angio  11/22/2007   PTA/ stenting of right common iliac artery with a 10x67mm Smart stent and postdilated with a 7x49mmpowerflex balloon; high grade calcified stenosis of the RICA  . PV angio  04/01/14   AAA, Lt renal artery stenosis, patent iliacs  . VIDEO ASSISTED THORACOSCOPY (VATS)/DECORTICATION Right 12/17/2014   Procedure: RIGHT VIDEO ASSISTED THORACOSCOPY (VATS);  Surgeon: Gaye Pollack, MD;  Location[facility];  Service: Thoracic;  Laterality: Right;        Home Medications    Prior to Admission medications   Medication Sig Start Date End Date Taking? Authorizing Provider  albuterol (ACCUNEB) 0.63 MG/3ML nebulizer solution Take 1 ampule by nebulization every 6 (six) hours as needed for wheezing.   Yes [provider]  amLODipine (NORVASC) 5 MG tablet Take 5 mg by mouth daily.   Yes [provider]  atorvastatin (LIPITOR) 80 MG tablet Take 80 mg by mouth at bedtime.    Yes [provider]  bisoprolol (ZEBETA) 5 MG tablet Take 5 mg by mouth daily. 11/11/14  Yes [provider]  bumetanide (BUMEX) 2 MG  tablet Take 2 mg by mouth 2 (two) times daily.   Yes [provider]  calcitRIOL (ROCALTROL) 0.25 MCG capsule Take 0.25 mcg by mouth daily.   Yes [provider]  finasteride (PROSCAR) 5 MG tablet Take 5 mg by mouth daily.   Yes [provider]  hydrALAZINE (APRESOLINE) 25 MG tablet Take 75 mg by mouth 2 (two) times daily.   Yes [provider]  magnesium oxide (MAG-OX) 400 MG tablet Take 400 mg by mouth 2 (two) times daily.   Yes [provider]  omeprazole (PRILOSEC) 20 MG capsule Take 20 mg by mouth at bedtime.    Yes [provider]  tamsulosin (FLOMAX) 0.4 MG CAPS capsule Take 0.4 mg by mouth as directed. Take 2 tablets daily at night   Yes [provider]  torsemide [facility])  20 MG tablet Take 20 mg by mouth 4 (four) times daily.  06/21/18  Yes [provider]  warfarin (COUMADIN) 5 MG tablet Take 1 tablet daily except 1/2 tablet on Mondays, Wednesdays and Fridays or as directed Patient taking differently: 5 mg. Take 1 tablet daily except 1/2 tablet on Mondays, Wednesdays and Fridays or as directed 05/03/16  Yes [name], MD    Family History Family History  Problem Relation Age of Onset  . Heart failure Father     Social History Social History   Tobacco Use  . Smoking status: Current Every Day Smoker    Packs/day: 1.00    Years: 56.00    Pack years: 56.00    Types: Cigarettes  . Smokeless tobacco: Never Used  Substance Use Topics  . Alcohol use: No    Alcohol/week: 0.0 standard drinks  . Drug use: No     Allergies   Niaspan [niacin er]   Review of Systems Review of Systems  Constitutional: Negative for appetite change and fatigue.  HENT: Negative for congestion, ear discharge and sinus pressure.   Eyes: Negative for discharge.  Respiratory: Positive for shortness of breath. Negative for cough.   Cardiovascular: Positive for chest pain.  Gastrointestinal: Negative for abdominal pain and  diarrhea.  Genitourinary: Negative for frequency and hematuria.  Musculoskeletal: Negative for back pain.  Skin: Negative for rash.  Neurological: Negative for seizures and headaches.  Psychiatric/Behavioral: Negative for hallucinations.     Physical Exam Updated Vital Signs BP (!) 120/55   Pulse (!) 47   Temp 97.7 F (36.5 C) (Oral)   Resp 16   Ht 5\' 10"  (1.778 m)   Wt 84.8 kg   SpO2 100%   BMI 26.83 kg/m   Physical Exam Vitals signs and nursing note reviewed.  Constitutional:      Appearance: He is well-developed.  HENT:     Head: Normocephalic.     Nose: Nose normal.  Eyes:     General: No scleral icterus.    Conjunctiva/sclera: Conjunctivae normal.  Neck:     Musculoskeletal: Neck supple.     Thyroid: No thyromegaly.  Cardiovascular:     Rate and Rhythm: Normal rate and regular rhythm.     Heart sounds: No murmur. No friction rub. No gallop.   Pulmonary:     Breath sounds: No stridor. Rales present. No wheezing.  Chest:     Chest wall: No tenderness.  Abdominal:     General: There is no distension.     Tenderness: There is no abdominal tenderness. There is no rebound.  Musculoskeletal: Normal range of motion.  Lymphadenopathy:     Cervical: No cervical adenopathy.  Skin:    Findings: No erythema or rash.  Neurological:     Mental Status: He is oriented to person, place, and time.     Motor: No abnormal muscle tone.     Coordination: Coordination normal.  Psychiatric:        Behavior: Behavior normal.      ED Treatments / Results  Labs (all labs ordered are listed, but only abnormal results are displayed) Labs Reviewed  CBC WITH DIFFERENTIAL/PLATELET - Abnormal; Notable for the following components:      Result Value   WBC 13.2 (*)    Neutro Abs 10.5 (*)    All other components within normal limits  COMPREHENSIVE METABOLIC PANEL - Abnormal; Notable for the following components:   Sodium 133 (*)    Glucose, Bld  164 (*)    BUN 67 (*)     Creatinine, Ser 4.09 (*)    AST 13 (*)    GFR calc non Af Amer 14 (*)    GFR calc Af Amer 16 (*)    All other components within normal limits  BRAIN NATRIURETIC PEPTIDE - Abnormal; Notable for the following components:   B Natriuretic Peptide 815.0 (*)    All other components within normal limits  TROPONIN I - Abnormal; Notable for the following components:   Troponin I 0.03 (*)    All other components within normal limits  PROTIME-INR    EKG EKG Interpretation  Date/Time:  Friday August 04 2018 19:38:54 EDT Ventricular Rate:  51 PR Interval:    QRS Duration: 136 QT Interval:  448 QTC Calculation: 413 R Axis:   -8 Text Interpretation:  Atrial fibrillation Right bundle branch block Inferior infarct, age indeterminate Lateral leads are also involved Confirmed by Milton Ferguson [phone]) on 08/04/2018 9:04:49 PM   Radiology Dg Chest 2 View  Result Date: 08/04/2018 CLINICAL DATA:  Hypotensive, shortness of breath. History of end-stage renal disease, RIGHT VATS for empyema. EXAM: CHEST - 2 VIEW COMPARISON:  CT chest September 06, 2016 FINDINGS: Cardiac silhouette is moderately enlarged. Calcified aortic arch. Status post median sternotomy for CABG. Pulmonary vascular congestion without pleural effusion or focal consolidation. No pneumothorax. Linear calcifications in the neck are likely vascular. IMPRESSION: 1. Moderate cardiomegaly and pulmonary vascular congestion. No acute pulmonary process. 2.  Aortic Atherosclerosis (ICD10-I70.0). Electronically Signed   By: Elon Alas M.D.   On: 08/04/2018 19:55    Procedures Procedures (including critical care time)  Medications Ordered in ED Medications  furosemide (LASIX) injection 80 mg (has no administration in time range)  albuterol (PROVENTIL) (2.5 MG/3ML) 0.083% nebulizer solution 5 mg (5 mg Nebulization Given 08/04/18 2056)     Initial Impression / Assessment and Plan / ED Course  I have reviewed the triage vital signs and the  nursing notes.  Pertinent labs & imaging results that were available during my care of the patient were reviewed by me and considered in my medical decision making (see chart for details).       CRITICAL CARE Performed by: Milton Ferguson Total critical care time:35 minutes Critical care time was exclusive of separately billable procedures and treating other patients. Critical care was necessary to treat or prevent imminent or life-threatening deterioration. Critical care was time spent personally by me on the following activities: development of treatment plan with patient and/or surrogate as well as nursing, discussions with consultants, evaluation of patient's response to treatment, examination of patient, obtaining history from patient or surrogate, ordering and performing treatments and interventions, ordering and review of laboratory studies, ordering and review of radiographic studies, pulse oximetry and re-evaluation of patient's condition. Patient with renal failure and just of heart failure.  Will be admitted for diuresis.  Final Clinical Impressions(s) / ED Diagnoses   Final diagnoses:  Systolic congestive heart failure, unspecified HF chronicity [facility])    ED Discharge Orders    None       Milton Ferguson, MD 08/04/18 2140

## 2018-08-04 NOTE — H&P (Signed)
History and Physical    Barry Lawrence DXI:[id number] DOB: 11-26-1945 DOA: 08/04/2018  PCP: Christain Sacramento, MD  Patient coming from: Home  Chief Complaint: Shortness of breath  HPI: Barry Lawrence is a 73 y.o. male with medical history significant of stage IV renal disease with a baseline creatinine of 3.2 who has had a graft put in for the last 3 -4 years prepping to start dialysis.  Normally managed by the [place].  Patient is a patient of Dr. Lowanda Foster is supposed to be seen later this month to institute dialysis.  Patient over the last several days had more shortness of breath.  He denies any fevers or cough but has had more swelling in his ankles.  He does not require supplemental oxygen at home but he has been checking his O2 sats particularly in the morning he is low at 89%.  Patient be referred for admission for pulmonary edema and worsening renal failure.  He is not in any respiratory distress and is able to speak in full sentences.   Review of Systems: As per HPI otherwise 10 point review of systems negative.   Past Medical History:  Diagnosis Date  . Abdominal aortic aneurysm ([place]) 01/28/10; 02/05/14   4.3x4.6cm;  now measuring 5.5 cm, status post aortobifemoral bypass grafting in January 2016 by Dr. problem along with left renal artery bypass  . Arthritis    in his back  . CAD (coronary artery disease)    myoview 04/21/11-normal, EF49%  . Carotid stenosis 01/15/08   right endarterectomy-Dr. Amedeo Plenty  . CKD (chronic kidney disease), stage III ([place])   . Diabetes ([place])    BORDERLINE  . GERD (gastroesophageal reflux disease)   . Headache    uses [employer], states he is lessening what he is taking for prep. for surgery   . Hyperlipemia   . Hypertension    stress test- done 04/2014, followed by Dr. Gwenlyn Found  . Myocardial infarction ([place]) 1996  . OSA on CPAP    CPAP q night , CPAP is through [place], states doesn't remember when he had the last study  . PAD (peripheral artery disease) ([place])  06/12/09   a. 11/22/07 PTA & stenting right external iliac artery;bilateral iliac & PTI and stenting 1997;right ICA =>50% reduction,right SFA >50%,left ICA at stent => 50% reduction,left EIA => 50% reduction,left ATA occluded, left SFA mid > 49% reduction;  03/2014 Angio: LRA 90, patent L Iliac stent and distal RCIA stent, large saccular AAA.  [name] Pneumonia    hosp. as a child with pneumonia, not since   . Polycythemia    H/O  . S/P CABG (coronary artery bypass graft) 1996  . Tobacco abuse     Past Surgical History:  Procedure Laterality Date  . ABDOMINAL AORTAGRAM N/A 04/01/2014   Procedure: ABDOMINAL Maxcine Ham;  Surgeon: [name], MD;  Location: [facility] CATH LAB;  Service: Cardiovascular;  Laterality: N/A;  . AORTA - BILATERAL FEMORAL ARTERY BYPASS GRAFT N/A 05/30/2014   Procedure: AORTOBIFEMORAL BYPASS GRAFT;  Surgeon: Serafina Mitchell, MD;  Location: [place];  Service: Vascular;  Laterality: N/A;  . AORTIC/RENAL BYPASS Left 05/30/2014   Procedure: LEFT RENAL ARTERY BYPASS;  Surgeon: Serafina Mitchell, MD;  Location: [place];  Service: Vascular;  Laterality: Left;  . BACK SURGERY  1988   [facility]  . CAROTID ENDARTERECTOMY Right 01/15/08  . COLONOSCOPY N/A 07/09/2013   Procedure: COLONOSCOPY;  Surgeon: Juanita Craver, MD;  Location: [facility] ENDOSCOPY;  Service:  Endoscopy;  Laterality: N/A;  . CORONARY ARTERY BYPASS GRAFT  1996   LIMA to LAD, sequential vein to OM1 and 2 as well as acure marginal branch and diag branch  . EMPYEMA DRAINAGE Right 12/17/2014   Procedure: EMPYEMA DRAINAGE;  Surgeon: Gaye Pollack, MD;  Location: [employer];  Service: Thoracic;  Laterality: Right;  . [place] SURGERY  1998  . pv angio  11/22/2007   PTA/ stenting of right common iliac artery with a 10x53mm Smart stent and postdilated with a 7x21mmpowerflex balloon; high grade calcified stenosis of the RICA  . PV angio  04/01/14   AAA, Lt renal artery stenosis, patent iliacs  . VIDEO ASSISTED THORACOSCOPY (VATS)/DECORTICATION Right  12/17/2014   Procedure: RIGHT VIDEO ASSISTED THORACOSCOPY (VATS);  Surgeon: Gaye Pollack, MD;  Location[facility];  Service: Thoracic;  Laterality: Right;     reports that he has been smoking cigarettes. He has a 56.00 pack-year smoking history. He has never used smokeless tobacco. He reports that he does not drink alcohol or use drugs.  Allergies  Allergen Reactions  . Niaspan [Niacin Er] Hives    Family History  Problem Relation Age of Onset  . Heart failure Father     Prior to Admission medications   Medication Sig Start Date End Date Taking? Authorizing Provider  albuterol (ACCUNEB) 0.63 MG/3ML nebulizer solution Take 1 ampule by nebulization every 6 (six) hours as needed for wheezing.   Yes [provider]  amLODipine (NORVASC) 5 MG tablet Take 5 mg by mouth daily.   Yes [provider]  atorvastatin (LIPITOR) 80 MG tablet Take 80 mg by mouth at bedtime.    Yes [provider]  bisoprolol (ZEBETA) 5 MG tablet Take 5 mg by mouth daily. 11/11/14  Yes [provider]  bumetanide (BUMEX) 2 MG tablet Take 2 mg by mouth 2 (two) times daily.   Yes [provider]  calcitRIOL (ROCALTROL) 0.25 MCG capsule Take 0.25 mcg by mouth daily.   Yes [provider]  finasteride (PROSCAR) 5 MG tablet Take 5 mg by mouth daily.   Yes [provider]  hydrALAZINE (APRESOLINE) 25 MG tablet Take 75 mg by mouth 2 (two) times daily.   Yes [provider]  magnesium oxide (MAG-OX) 400 MG tablet Take 400 mg by mouth 2 (two) times daily.   Yes [provider]  omeprazole (PRILOSEC) 20 MG capsule Take 20 mg by mouth at bedtime.    Yes [provider]  tamsulosin (FLOMAX) 0.4 MG CAPS capsule Take 0.4 mg by mouth as directed. Take 2 tablets daily at night   Yes [provider]  torsemide (DEMADEX) 20 MG tablet Take 20 mg by mouth 4 (four) times daily.  06/21/18  Yes [provider]  warfarin (COUMADIN) 5 MG  tablet Take 1 tablet daily except 1/2 tablet on Mondays, Wednesdays and Fridays or as directed Patient taking differently: 5 mg. Take 1 tablet daily except 1/2 tablet on Mondays, Wednesdays and Fridays or as directed 05/03/16  Yes [name], MD    Physical Exam: Vitals:   08/04/18 2100 08/04/18 2130 08/04/18 2200 08/04/18 2230  BP: (!) 120/55 (!) 127/56 (!) 123/55 (!) 113/49  Pulse: (!) 47 (!) 58 (!) 46 60  Resp: 16 (!) 22 16 19   Temp:      TempSrc:      SpO2: 100% 98% 97% 97%  Weight:      Height:  Constitutional: NAD, calm, comfortable Vitals:   08/04/18 2100 08/04/18 2130 08/04/18 2200 08/04/18 2230  BP: (!) 120/55 (!) 127/56 (!) 123/55 (!) 113/49  Pulse: (!) 47 (!) 58 (!) 46 60  Resp: 16 (!) 22 16 19   Temp:      TempSrc:      SpO2: 100% 98% 97% 97%  Weight:      Height:       Eyes: PERRL, lids and conjunctivae normal ENMT: Mucous membranes are moist. Posterior pharynx clear of any exudate or lesions.Normal dentition.  Neck: normal, supple, no masses, no thyromegaly Respiratory: clear to auscultation bilaterally, no wheezing, no crackles. Normal respiratory effort. No accessory muscle use.  Cardiovascular: Regular rate and rhythm, no murmurs / rubs / gallops. No extremity edema. 2+ pedal pulses. No carotid bruits.  Abdomen: no tenderness, no masses palpated. No hepatosplenomegaly. Bowel sounds positive.  Musculoskeletal: no clubbing / cyanosis. No joint deformity upper and lower extremities. Good ROM, no contractures. Normal muscle tone.  Skin: no rashes, lesions, ulcers. No induration Neurologic: CN 2-12 grossly intact. Sensation intact, DTR normal. Strength 5/5 in all 4.  Psychiatric: Normal judgment and insight. Alert and oriented x 3. Normal mood.    Labs on Admission: I have personally reviewed following labs and imaging studies  CBC: Recent Labs  Lab 08/04/18 1948  WBC 13.2*  NEUTROABS 10.5*  HGB 14.0  HCT 44.7  MCV 95.5  PLT 888    Basic Metabolic Panel: Recent Labs  Lab 08/04/18 1948  NA 133*  K 4.1  CL 98  CO2 24  GLUCOSE 164*  BUN 67*  CREATININE 4.09*  CALCIUM 9.4   GFR: Estimated Creatinine Clearance: 16.9 mL/min (A) (by C-G formula based on SCr of 4.09 mg/dL (H)). Liver Function Tests: Recent Labs  Lab 08/04/18 1948  AST 13*  ALT 12  ALKPHOS 72  BILITOT 0.4  PROT 7.1  ALBUMIN 4.2   No results for input(s): LIPASE, AMYLASE in the last 168 hours. No results for input(s): AMMONIA in the last 168 hours. Coagulation Profile: Recent Labs  Lab 08/04/18 1948  INR 1.6*   Cardiac Enzymes: Recent Labs  Lab 08/04/18 1948  TROPONINI 0.03*   BNP (last 3 results) No results for input(s): PROBNP in the last 8760 hours. HbA1C: No results for input(s): HGBA1C in the last 72 hours. CBG: No results for input(s): GLUCAP in the last 168 hours. Lipid Profile: No results for input(s): CHOL, HDL, LDLCALC, TRIG, CHOLHDL, LDLDIRECT in the last 72 hours. Thyroid Function Tests: No results for input(s): TSH, T4TOTAL, FREET4, T3FREE, THYROIDAB in the last 72 hours. Anemia Panel: No results for input(s): VITAMINB12, FOLATE, FERRITIN, TIBC, IRON, RETICCTPCT in the last 72 hours. Urine analysis:    Component Value Date/Time   COLORURINE YELLOW 11/29/2014 1025   APPEARANCEUR CLEAR 11/29/2014 1025   LABSPEC 1.016 11/29/2014 1025   PHURINE 5.0 11/29/2014 1025   GLUCOSEU NEGATIVE 11/29/2014 1025   HGBUR SMALL (A) 11/29/2014 1025   BILIRUBINUR NEGATIVE 11/29/2014 1025   KETONESUR NEGATIVE 11/29/2014 1025   PROTEINUR >300 (A) 11/29/2014 1025   UROBILINOGEN 0.2 11/29/2014 1025   NITRITE NEGATIVE 11/29/2014 1025   LEUKOCYTESUR NEGATIVE 11/29/2014 1025   Sepsis Labs: !!!!!!!!!!!!!!!!!!!!!!!!!!!!!!!!!!!!!!!!!!!! @LABRCNTIP (procalcitonin:4,lacticidven:4) )No results found for this or any previous visit (from the past 240 hour(s)).   Radiological Exams on Admission: Dg Chest 2 View  Result Date: 08/04/2018  CLINICAL DATA:  Hypotensive, shortness of breath. History of end-stage renal disease, RIGHT VATS for empyema. EXAM: CHEST -  2 VIEW COMPARISON:  CT chest September 06, 2016 FINDINGS: Cardiac silhouette is moderately enlarged. Calcified aortic arch. Status post median sternotomy for CABG. Pulmonary vascular congestion without pleural effusion or focal consolidation. No pneumothorax. Linear calcifications in the neck are likely vascular. IMPRESSION: 1. Moderate cardiomegaly and pulmonary vascular congestion. No acute pulmonary process. 2.  Aortic Atherosclerosis (ICD10-I70.0). Electronically Signed   By: Elon Alas M.D.   On: 08/04/2018 19:55   Old chart reviewed Case cussed with Dr. Roderic Palau in the ED  Assessment/Plan 73 year old male with acute on chronic diastolic congestive heart failure and advancing renal disease Principal Problem:   Acute on chronic diastolic heart failure (HCC)-placed on Lasix 80 mg IV every 12 hours.  Patient is still making urine.  Monitor accurate I's and O's.  Placed on renal diet.  Daily weights.  Active Problems:   CAD (coronary artery disease)-stable   Abdominal aortic aneurysm without rupture (HCC)-stable   CKD (chronic kidney disease), stage IV (HCC)-consult nephrology to see if he needs to have dialysis started sooner rather than later      DVT prophylaxis: Coumadin Code Status: Full Family Communication: Wife Disposition Plan: 1 to 2 days Consults called: Nephrology Admission status: Observation   Jahmel, A MD [facility]  If 7PM-7AM, please contact night-coverage [email or web address] Password [facility]  08/04/2018, 10:39 PM

## 2018-08-05 DIAGNOSIS — I120 Hypertensive chronic kidney disease with stage 5 chronic kidney disease or end stage renal disease: Secondary | ICD-10-CM | POA: Diagnosis not present

## 2018-08-05 DIAGNOSIS — I5033 Acute on chronic diastolic (congestive) heart failure: Secondary | ICD-10-CM | POA: Diagnosis not present

## 2018-08-05 DIAGNOSIS — T50905A Adverse effect of unspecified drugs, medicaments and biological substances, initial encounter: Secondary | ICD-10-CM

## 2018-08-05 DIAGNOSIS — D631 Anemia in chronic kidney disease: Secondary | ICD-10-CM | POA: Diagnosis not present

## 2018-08-05 DIAGNOSIS — R001 Bradycardia, unspecified: Secondary | ICD-10-CM | POA: Diagnosis present

## 2018-08-05 DIAGNOSIS — N185 Chronic kidney disease, stage 5: Secondary | ICD-10-CM | POA: Diagnosis not present

## 2018-08-05 LAB — BASIC METABOLIC PANEL
Anion gap: 10 (ref 5–15)
BUN: 68 mg/dL — ABNORMAL HIGH (ref 8–23)
CO2: 24 mmol/L (ref 22–32)
Calcium: 9 mg/dL (ref 8.9–10.3)
Chloride: 100 mmol/L (ref 98–111)
Creatinine, Ser: 3.95 mg/dL — ABNORMAL HIGH (ref 0.61–1.24)
GFR calc Af Amer: 16 mL/min — ABNORMAL LOW (ref >60)
GFR calc non Af Amer: 14 mL/min — ABNORMAL LOW (ref >60)
Glucose, Bld: 88 mg/dL (ref 70–99)
Potassium: 3.9 mmol/L (ref 3.5–5.1)
Sodium: 134 mmol/L — ABNORMAL LOW (ref 135–145)

## 2018-08-05 LAB — CBC
HCT: 39.5 % (ref 39.0–52.0)
Hemoglobin: 12.5 g/dL — ABNORMAL LOW (ref 13.0–17.0)
MCH: 29.9 pg (ref 26.0–34.0)
MCHC: 31.6 g/dL (ref 30.0–36.0)
MCV: 94.5 fL (ref 80.0–100.0)
Platelets: 195 10*3/uL (ref 150–400)
RBC: 4.18 MIL/uL — ABNORMAL LOW (ref 4.22–5.81)
RDW: 13.9 % (ref 11.5–15.5)
WBC: 10.4 10*3/uL (ref 4.0–10.5)
nRBC: 0 % (ref 0.0–0.2)

## 2018-08-05 LAB — PROTIME-INR
INR: 1.7 — ABNORMAL HIGH (ref 0.8–1.2)
Prothrombin Time: 20.1 seconds — ABNORMAL HIGH (ref 11.4–15.2)

## 2018-08-05 MED ORDER — WARFARIN SODIUM 5 MG PO TABS
5.0000 mg | ORAL_TABLET | Freq: Once | ORAL | Status: AC
  Administered 2018-08-05: 5 mg via ORAL
  Filled 2018-08-05: qty 1

## 2018-08-05 MED ORDER — WARFARIN - PHARMACIST DOSING INPATIENT
Freq: Every day | Status: DC
  Administered 2018-08-06 – 2018-08-07 (×2)

## 2018-08-05 MED ORDER — FUROSEMIDE 10 MG/ML IJ SOLN
160.0000 mg | Freq: Four times a day (QID) | INTRAVENOUS | Status: AC
  Administered 2018-08-05: 160 mg via INTRAVENOUS
  Filled 2018-08-05 (×2): qty 16

## 2018-08-05 MED ORDER — ACETAMINOPHEN 325 MG PO TABS
650.0000 mg | ORAL_TABLET | Freq: Four times a day (QID) | ORAL | Status: DC | PRN
  Administered 2018-08-05 – 2018-08-07 (×3): 650 mg via ORAL
  Filled 2018-08-05 (×3): qty 2

## 2018-08-05 MED ORDER — AMLODIPINE BESYLATE 5 MG PO TABS
2.5000 mg | ORAL_TABLET | Freq: Every day | ORAL | Status: DC
  Administered 2018-08-07 – 2018-08-08 (×2): 2.5 mg via ORAL
  Filled 2018-08-05 (×3): qty 1

## 2018-08-05 MED ORDER — FUROSEMIDE 10 MG/ML IJ SOLN
160.0000 mg | Freq: Two times a day (BID) | INTRAVENOUS | Status: DC
  Administered 2018-08-06 – 2018-08-07 (×3): 160 mg via INTRAVENOUS
  Filled 2018-08-05 (×6): qty 16

## 2018-08-05 MED ORDER — BISOPROLOL FUMARATE 5 MG PO TABS
5.0000 mg | ORAL_TABLET | Freq: Every day | ORAL | Status: DC
  Administered 2018-08-07: 5 mg via ORAL
  Filled 2018-08-05 (×2): qty 1

## 2018-08-05 NOTE — Progress Notes (Signed)
SATURATION QUALIFICATIONS: (This note is used to comply with regulatory documentation for home oxygen)  Patient Saturations on Room Air at Rest = 96%  Patient Saturations on Room Air while Ambulating = 93%  Patient Saturations on 0 Liters of oxygen while Ambulating = N/A%  Please briefly explain why patient needs home oxygen:

## 2018-08-05 NOTE — Progress Notes (Signed)
ANTICOAGULATION CONSULT NOTE - Initial Consult  Pharmacy Consult for Coumadin Indication: atrial fibrillation  Allergies  Allergen Reactions  . Niaspan [Niacin Er] Hives    Patient Measurements: Height: 5\' 7"  (170.2 cm) Weight: 186 lb 8.2 oz (84.6 kg) IBW/kg (Calculated) : 66.1   Vital Signs: Temp: 98.3 F (36.8 C) (03/14 0635) Temp Source: Oral (03/14 0635) BP: 112/54 (03/14 0635) Pulse Rate: 41 (03/14 0635)  Labs: Recent Labs    08/04/18 1948 08/04/18 2301 08/05/18 0640  HGB 14.0  --  12.5*  HCT 44.7  --  39.5  PLT 226  --  195  LABPROT 19.2*  --  20.1*  INR 1.6*  --  1.7*  CREATININE 4.09*  --  3.95*  TROPONINI 0.03* 0.03*  --     Estimated Creatinine Clearance: 17.6 mL/min (A) (by C-G formula based on SCr of 3.95 mg/dL (H)).   Medical History: Past Medical History:  Diagnosis Date  . Abdominal aortic aneurysm ([place]) 01/28/10; 02/05/14   4.3x4.6cm;  now measuring 5.5 cm, status post aortobifemoral bypass grafting in January 2016 by Dr. problem along with left renal artery bypass  . Arthritis    in his back  . CAD (coronary artery disease)    myoview 04/21/11-normal, EF49%  . Carotid stenosis 01/15/08   right endarterectomy-Dr. Amedeo Plenty  . CKD (chronic kidney disease), stage III ([employer])   . Diabetes ([place])    BORDERLINE  . GERD (gastroesophageal reflux disease)   . Headache    uses [employer], states he is lessening what he is taking for prep. for surgery   . Hyperlipemia   . Hypertension    stress test- done 04/2014, followed by Dr. Gwenlyn Found  . Myocardial infarction ([place]) 1996  . OSA on CPAP    CPAP q night , CPAP is through [place], states doesn't remember when he had the last study  . PAD (peripheral artery disease) ([place]) 06/12/09   a. 11/22/07 PTA & stenting right external iliac artery;bilateral iliac & PTI and stenting 1997;right ICA =>50% reduction,right SFA >50%,left ICA at stent => 50% reduction,left EIA => 50% reduction,left ATA occluded, left SFA mid > 49%  reduction;  03/2014 Angio: LRA 90, patent L Iliac stent and distal RCIA stent, large saccular AAA.  [name] Pneumonia    hosp. as a child with pneumonia, not since   . Polycythemia    H/O  . S/P CABG (coronary artery bypass graft) 1996  . Tobacco abuse     Medications:  Medications Prior to Admission  Medication Sig Dispense Refill Last Dose  . albuterol (ACCUNEB) 0.63 MG/3ML nebulizer solution Take 1 ampule by nebulization every 6 (six) hours as needed for wheezing.   08/04/2018  . amLODipine (NORVASC) 5 MG tablet Take 5 mg by mouth daily.   08/04/2018  . atorvastatin (LIPITOR) 80 MG tablet Take 80 mg by mouth at bedtime.    08/03/2018 at Unknown time  . bisoprolol (ZEBETA) 5 MG tablet Take 5 mg by mouth daily.  0 08/03/2018 at 0900  . bumetanide (BUMEX) 2 MG tablet Take 2 mg by mouth 2 (two) times daily.   08/04/2018  . calcitRIOL (ROCALTROL) 0.25 MCG capsule Take 0.25 mcg by mouth daily.   08/04/2018  . finasteride (PROSCAR) 5 MG tablet Take 5 mg by mouth daily.   08/03/2018 at Unknown time  . hydrALAZINE (APRESOLINE) 25 MG tablet Take 75 mg by mouth 2 (two) times daily.   08/04/2018  . magnesium oxide (MAG-OX) 400 MG tablet Take  400 mg by mouth 2 (two) times daily.   08/04/2018  . omeprazole (PRILOSEC) 20 MG capsule Take 20 mg by mouth at bedtime.    08/04/2018  . tamsulosin (FLOMAX) 0.4 MG CAPS capsule Take 0.4 mg by mouth as directed. Take 2 tablets daily at night   08/03/2018 at Unknown time  . torsemide (DEMADEX) 20 MG tablet Take 20 mg by mouth 4 (four) times daily.    08/04/2018  . warfarin (COUMADIN) 5 MG tablet Take 1 tablet daily except 1/2 tablet on Mondays, Wednesdays and Fridays or as directed (Patient taking differently: 5 mg. Take 1 tablet daily except 1/2 tablet on Mondays, Wednesdays and Fridays or as directed) 30 tablet 4 08/04/2018 at 0900    Assessment: 73 yo presented to ED with SOB. history significant of stage IV renal disease with a baseline creatinine of 3.2 who has had a graft  put in for the last 3 -4 years prepping to start dialysis. Patient referred for pulmonary edema and worsening renal function. Patient on chronic coumadin for afib and pharmacy asked to dose. INR is subtherapeutic Home dose is 5mg  on Tues, Thursday, Saturday, Sunday; 2.5mg  on Monday, Wednesday, and Friday  Goal of Therapy:  INR 2-3 Monitor platelets by anticoagulation protocol: Yes   Plan:  Coumadin 5mg  po x 1 today PT-INR daily Monitor for S/S of bleeding  Isac Sarna, BS [place]ena [place], BCPS Clinical Pharmacist Pager [phone] 08/05/2018,8:24 AM

## 2018-08-05 NOTE — Progress Notes (Addendum)
PROGRESS NOTE  Barry Lawrence INO:[id number] DOB: [birth year] DOA: 08/04/2018 PCP: Christain Sacramento, MD  HPI/Recap of past 24 hours: Early Chars. Barry Lawrence is a 73 year old male with a significant history for stage V renal disease with baseline creatinine of 3.2 months followed by Dr. Lowanda Foster, hypertension, atrial fibrillation on Coumadin GERD, type 2 diabetes mellitus, coronary artery disease, hyperlipidemia obstructive sleep apnea on CPAP peripheral vascular disease who was admitted for pulmonary edema due to progressive dyspnea and home O2 monitor showing 89%.  He is not on oxygen at home.  Subjective: Patient seen and examined at bedside with his wife in the room he is complaining that he has not put out much urine even with the IV Lasix 80 mg.  He complains of orthopnea dyspnea on exertion and edema treatment that was given to him in the emergency department at least saturating well at 97% on room air he is not in any distress  Assessment/Plan: Principal Problem:   Acute on chronic diastolic heart failure (HCC) Active Problems:   CAD (coronary artery disease)   Abdominal aortic aneurysm without rupture (HCC)   CKD (chronic kidney disease), stage IV ([place])  1.  Pulmonary edema with underlying COPD.  Patient was started on IV Lasix 80 mg IV twice daily but he is complaining put up too much so nephrology has increased to 260 mg IV.  And recommended adding metolazone if he is not still diuresing well  2.  Chronic kidney disease stage V.  Nephrology is involved and he thinks they can manage him medically.  He already has a fistula ordered but has not started hemodialysis yet.  3.  Hypertension he is on hydralazine, amlodipine and bisoprolol 5 mg daily he is bradycardic and probably needs to hold the bisoprolol with parameters  4.  asymptomatic bradycardia likely from his beta-blocker he is on bisoprolol 5 mg.  His heart rate is in the 40s(  41,40 the highest is 47) we will currently hold his  beta-blocker with parameters  6.  Anemia of chronic disease most likely secondary to his chronic kidney disease but he is normal at 14  7.  Chronic atrial fibrillation on Coumadin stable heart rate  Code Status: Full  Severity of Illness: The appropriate patient status for this patient is OBSERVATION. Observation status is judged to be reasonable and necessary in order to provide the required intensity of service to ensure the patient's safety. The patient's presenting symptoms, physical exam findings, and initial radiographic and laboratory data in the context of their medical condition is felt to place them at decreased risk for further clinical deterioration. Furthermore, it is anticipated that the patient will be medically stable for discharge from the hospital within 2 midnights of admission. The following factors support the patient status of observation.   " The patient's presenting symptoms include pulmonary edema needing IV diuresis. " The physical exam findings include bilateral wheezing rales " The initial radiographic and laboratory data are  Pulmonary edema     Family Communication: Wife, Doris at bedside  Disposition Plan: Home when stable in in the morning   Consultants:  Nephrology Dr. Johnney Ou  Procedures:  None  Antimicrobials:  None  DVT prophylaxis: Coumadin  Objective: Vitals:   08/04/18 2348 08/05/18 0635 08/05/18 0845 08/05/18 1326  BP: (!) 128/56 (!) 112/54 (!) 108/58 114/60  Pulse: (!) 47 (!) 41  (!) 47  Resp:    20  Temp:  98.3 F (36.8 C)  98.1 F (36.7 C)  TempSrc:  Oral  Oral  SpO2:  97%  92%  Weight:      Height:        Intake/Output Summary (Last 24 hours) at 08/05/2018 1750 Last data filed at 08/05/2018 1326 Gross per 24 hour  Intake 720 ml  Output 100 ml  Net 620 ml   Filed Weights   08/04/18 1927 08/04/18 2249  Weight: 84.8 kg 84.6 kg   Body mass index is 29.21 kg/m.  Exam:  . General: 73 y.o. year-old male well  developed well nourished in no acute distress.  Alert and oriented x3. . Cardiovascular: Regular rate and rhythm with no rubs or gallops.  No thyromegaly or JVD noted.   [name] Respiratory: Scattered Rales and wheezing bilaterally, good inspiratory effort. . Abdomen: Soft nontender nondistended with normal bowel sounds x4 quadrants.  Large ventral hernia . Musculoskeletal: +1 pitting edema lower extremity edema. 2/4 pulses in all 4 extremities. . Skin: No ulcerative lesions noted patient has chronic stasis venous stasis dermatitis changes with hyperpigmentation and dry scaly skin on both legs and feet . Psychiatry: Mood is appropriate for condition and setting    Data Reviewed: CBC: Recent Labs  Lab 08/04/18 1948 08/05/18 0640  WBC 13.2* 10.4  NEUTROABS 10.5*  --   HGB 14.0 12.5*  HCT 44.7 39.5  MCV 95.5 94.5  PLT 226 286   Basic Metabolic Panel: Recent Labs  Lab 08/04/18 1948 08/05/18 0640  NA 133* 134*  K 4.1 3.9  CL 98 100  CO2 24 24  GLUCOSE 164* 88  BUN 67* 68*  CREATININE 4.09* 3.95*  CALCIUM 9.4 9.0   GFR: Estimated Creatinine Clearance: 17.6 mL/min (A) (by C-G formula based on SCr of 3.95 mg/dL (H)). Liver Function Tests: Recent Labs  Lab 08/04/18 1948  AST 13*  ALT 12  ALKPHOS 72  BILITOT 0.4  PROT 7.1  ALBUMIN 4.2   No results for input(s): LIPASE, AMYLASE in the last 168 hours. No results for input(s): AMMONIA in the last 168 hours. Coagulation Profile: Recent Labs  Lab 08/04/18 1948 08/05/18 0640  INR 1.6* 1.7*   Cardiac Enzymes: Recent Labs  Lab 08/04/18 1948 08/04/18 2301  TROPONINI 0.03* 0.03*   BNP (last 3 results) No results for input(s): PROBNP in the last 8760 hours. HbA1C: No results for input(s): HGBA1C in the last 72 hours. CBG: No results for input(s): GLUCAP in the last 168 hours. Lipid Profile: No results for input(s): CHOL, HDL, LDLCALC, TRIG, CHOLHDL, LDLDIRECT in the last 72 hours. Thyroid Function Tests: No results for  input(s): TSH, T4TOTAL, FREET4, T3FREE, THYROIDAB in the last 72 hours. Anemia Panel: No results for input(s): VITAMINB12, FOLATE, FERRITIN, TIBC, IRON, RETICCTPCT in the last 72 hours. Urine analysis:    Component Value Date/Time   COLORURINE YELLOW 11/29/2014 1025   APPEARANCEUR CLEAR 11/29/2014 1025   LABSPEC 1.016 11/29/2014 1025   PHURINE 5.0 11/29/2014 1025   GLUCOSEU NEGATIVE 11/29/2014 1025   HGBUR SMALL (A) 11/29/2014 1025   BILIRUBINUR NEGATIVE 11/29/2014 1025   KETONESUR NEGATIVE 11/29/2014 1025   PROTEINUR >300 (A) 11/29/2014 1025   UROBILINOGEN 0.2 11/29/2014 1025   NITRITE NEGATIVE 11/29/2014 1025   LEUKOCYTESUR NEGATIVE 11/29/2014 1025   Sepsis Labs: @LABRCNTIP (procalcitonin:4,lacticidven:4)  )No results found for this or any previous visit (from the past 240 hour(s)).    Studies: Dg Chest 2 View  Result Date: 08/04/2018 CLINICAL DATA:  Hypotensive, shortness of breath. History of end-stage renal disease, RIGHT VATS for empyema. EXAM:  CHEST - 2 VIEW COMPARISON:  CT chest September 06, 2016 FINDINGS: Cardiac silhouette is moderately enlarged. Calcified aortic arch. Status post median sternotomy for CABG. Pulmonary vascular congestion without pleural effusion or focal consolidation. No pneumothorax. Linear calcifications in the neck are likely vascular. IMPRESSION: 1. Moderate cardiomegaly and pulmonary vascular congestion. No acute pulmonary process. 2.  Aortic Atherosclerosis (ICD10-I70.0). Electronically Signed   By: Elon Alas M.D.   On: 08/04/2018 19:55    Scheduled Meds: . [START ON 08/06/2018] amLODipine  2.5 mg Oral Daily  . atorvastatin  80 mg Oral QHS  . bisoprolol  5 mg Oral Daily  . calcitRIOL  0.25 mcg Oral Daily  . finasteride  5 mg Oral Daily  . hydrALAZINE  75 mg Oral BID  . magnesium oxide  400 mg Oral BID  . sodium chloride flush  3 mL Intravenous Q12H  . tamsulosin  0.8 mg Oral QHS  . Warfarin - Pharmacist Dosing Inpatient   Does not apply  q1800    Continuous Infusions: . sodium chloride    . furosemide 160 mg (08/05/18 1601)  . [START ON 08/06/2018] furosemide       LOS: 0 days     Cristal Deer, MD [facility]  To reach me or the doctor on call, go to: [email or web address] Password [email or web address]  08/05/2018, 5:50 PM

## 2018-08-05 NOTE — Consult Note (Signed)
[facility]  INPATIENT CONSULTATION  Reason for Consultation: CKD 5 + volume overload Requesting Provider:  Dr. Kyung Bacca  HPI: Barry Lawrence is an 73 y.o. 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    He had severe AKI 3-4 years ago requiring HD (AAA repair) and actually had AVF placed in prep for HD but has not required use of it yet as he recovered to CKD 3b-4.  [facility] but [facility] care with Dr. Hinda Lenis 05/2018 for convenience (was going to [place] for [place] care).  He's been seen twice.  His eplerenone was discontinued at some point for hyperkalemia.  He was on bumex 2 BID (rx 4qam/2qpm but he was taking 2 BID) and torsemide was added and titrated to 80 BID at a f/u visit due to increasing edema.  06/08/18 Cr 3.25, eGFR 18 > 07/17/18 Cr 3.51, eGFR 16.  2/24 iron sat 11%, Hb 14.1, iPTH 73.  [facility] with increasing dyspnea and hypoxia to 89% on home O2 sat monitor.  CXR showed cardiomegaly and pulmonary vascular congestion.  Labs showed Cr 4.06, eGFR 14; this AM Cr 3.95, eGFR 14. He notes the addition of torsemide to bumex didn't result in any diuresis.  He eats low sodium and reports no missed doses of meds.  Started on lasix 80 IV and hasn't had any diuresis yet.  + orthopnea, PND, DOE, edema.  No dysgeusia, hiccups, cognitive decline.   PMH: Past Medical History:  Diagnosis Date  . Abdominal aortic aneurysm ([place]) 01/28/10; 02/05/14   4.3x4.6cm;  now measuring 5.5 cm, status post aortobifemoral bypass grafting in January 2016 by Dr. problem along with left renal artery bypass  . Arthritis    in his back  . CAD (coronary artery disease)    myoview 04/21/11-normal, EF49%  . Carotid stenosis 01/15/08   right endarterectomy-Dr. Amedeo Plenty  . CKD (chronic kidney disease), stage III ([place])   . Diabetes ([place])    BORDERLINE  . GERD (gastroesophageal reflux disease)   . Headache    uses [employer], states he is lessening what he is taking for prep. for surgery   . Hyperlipemia   . Hypertension    stress test- done 04/2014, followed by Dr. Gwenlyn Found  . Myocardial infarction ([place]) 1996  . OSA on CPAP    CPAP q night , CPAP is through [place], states doesn't remember when he had the last study  . PAD (peripheral artery disease) ([place]) 06/12/09   a. 11/22/07 PTA & stenting right external iliac artery;bilateral iliac & PTI and stenting 1997;right ICA =>50% reduction,right SFA >50%,left ICA at stent => 50% reduction,left EIA => 50% reduction,left ATA occluded, left SFA mid > 49% reduction;  03/2014 Angio: LRA 90, patent L Iliac stent and distal RCIA stent, large saccular AAA.  [name] Pneumonia    hosp. as a child with pneumonia, not since   . Polycythemia    H/O  . S/P CABG (coronary artery bypass graft) 1996  . Tobacco abuse    PSH: Past Surgical History:  Procedure Laterality Date  . ABDOMINAL AORTAGRAM N/A 04/01/2014   Procedure: ABDOMINAL Maxcine Ham;  Surgeon: [name], MD;  Location: [facility] CATH LAB;  Service: Cardiovascular;  Laterality: N/A;  . AORTA - BILATERAL FEMORAL ARTERY BYPASS GRAFT N/A 05/30/2014  Procedure: AORTOBIFEMORAL BYPASS GRAFT;  Surgeon: Serafina Mitchell, MD;  Location: [place];  Service: Vascular;  Laterality: N/A;  . AORTIC/RENAL BYPASS Left 05/30/2014   Procedure: LEFT RENAL ARTERY BYPASS;  Surgeon: Serafina Mitchell, MD;  Location: [place];  Service: Vascular;  Laterality: Left;  . BACK SURGERY  1988   at [facility]  . CAROTID ENDARTERECTOMY Right 01/15/08  . COLONOSCOPY N/A 07/09/2013   Procedure: COLONOSCOPY;  Surgeon: Juanita Craver, MD;  Location: [facility] ENDOSCOPY;  Service: Endoscopy;  Laterality: N/A;  . CORONARY ARTERY BYPASS GRAFT  1996   LIMA to LAD, sequential vein to OM1 and 2 as well as acure marginal branch and diag branch  . EMPYEMA DRAINAGE Right 12/17/2014   Procedure: EMPYEMA DRAINAGE;   Surgeon: Gaye Pollack, MD;  Location: [place];  Service: Thoracic;  Laterality: Right;  . [place] SURGERY  1998  . pv angio  11/22/2007   PTA/ stenting of right common iliac artery with a 10x34m Smart stent and postdilated with a 7x428mowerflex balloon; high grade calcified stenosis of the RICA  . PV angio  04/01/14   AAA, Lt renal artery stenosis, patent iliacs  . VIDEO ASSISTED THORACOSCOPY (VATS)/DECORTICATION Right 12/17/2014   Procedure: RIGHT VIDEO ASSISTED THORACOSCOPY (VATS);  Surgeon: BrGaye PollackMD;  [facility];  Service: Thoracic;  Laterality: Right;     Past Medical History:  Diagnosis Date  . Abdominal aortic aneurysm ([place]9/7/11; 02/05/14   4.3x4.6cm;  now measuring 5.5 cm, status post aortobifemoral bypass grafting in January 2016 by Dr. problem along with left renal artery bypass  . Arthritis    in his back  . CAD (coronary artery disease)    myoview 04/21/11-normal, EF49%  . Carotid stenosis 01/15/08   right endarterectomy-Dr. HaAmedeo Plenty. CKD (chronic kidney disease), stage III ([place]  . Diabetes ([place]   BORDERLINE  . GERD (gastroesophageal reflux disease)   . Headache    uses [employer] he is lessening what he is taking for prep. for surgery   . Hyperlipemia   . Hypertension    stress test- done 04/2014, followed by Dr. BeGwenlyn Found. Myocardial infarction ([place]1996  . OSA on CPAP    CP[place] q night , [place] when he had the last study  . PAD (peripheral artery disease) ([place]1/20/11   a. 11/22/07 PTA & stenting right external iliac artery;bilateral iliac & PTI and stenting 1997;right ICA =>50% reduction,right SFA >50%,left ICA at stent => 50% reduction,left EIA => 50% reduction,left ATA occluded, left SFA mid > 49% reduction;  03/2014 Angio: LRA 90, patent L Iliac stent and distal RCIA stent, large saccular AAA.  . Marland Kitchenneumonia    hosp. as a child with pneumonia, not since   . Polycythemia    H/O  . S/P CABG (coronary  artery bypass graft) 1996  . Tobacco abuse     Medications:  I have reviewed the patient's current medications.  Medications Prior to Admission  Medication Sig Dispense Refill  . albuterol (ACCUNEB) 0.63 MG/3ML nebulizer solution Take 1 ampule by nebulization every 6 (six) hours as needed for wheezing.    . Marland KitchenmLODipine (NORVASC) 5 MG tablet Take 5 mg by mouth daily.    . Marland Kitchentorvastatin (LIPITOR) 80 MG tablet Take 80 mg by mouth at bedtime.     . bisoprolol (ZEBETA) 5 MG tablet Take 5 mg by mouth daily.  0  . bumetanide (BUMEX) 2  MG tablet Take 2 mg by mouth 2 (two) times daily.    . calcitRIOL (ROCALTROL) 0.25 MCG capsule Take 0.25 mcg by mouth daily.    . finasteride (PROSCAR) 5 MG tablet Take 5 mg by mouth daily.    . hydrALAZINE (APRESOLINE) 25 MG tablet Take 75 mg by mouth 2 (two) times daily.    . magnesium oxide (MAG-OX) 400 MG tablet Take 400 mg by mouth 2 (two) times daily.    [name] omeprazole (PRILOSEC) 20 MG capsule Take 20 mg by mouth at bedtime.     . tamsulosin (FLOMAX) 0.4 MG CAPS capsule Take 0.4 mg by mouth as directed. Take 2 tablets daily at night    . torsemide (DEMADEX) 20 MG tablet Take 20 mg by mouth 4 (four) times daily.     [name] warfarin (COUMADIN) 5 MG tablet Take 1 tablet daily except 1/2 tablet on Mondays, Wednesdays and Fridays or as directed (Patient taking differently: 5 mg. Take 1 tablet daily except 1/2 tablet on Mondays, Wednesdays and Fridays or as directed) 30 tablet 4    ALLERGIES:   Allergies  Allergen Reactions  . Niaspan [Niacin Er] Hives    FAM HX: Family History  Problem Relation Age of Onset  . Heart failure Father     Social History:   reports that he has been smoking cigarettes. He has a 56.00 pack-year smoking history. He has never used smokeless tobacco. He reports that he does not drink alcohol or use drugs.  ROS: 12 system ros neg except per HPI above  Blood pressure (!) 108/58, pulse (!) 41, temperature 98.3 F (36.8 C), temperature  source Oral, resp. rate 20, height 5' 7"  (1.702 m), weight 84.6 kg, SpO2 97 %. PHYSICAL EXAM: Gen: no distress seated at edge of bed  Eyes:  Anicteric, EOMI ENT: MMM Neck: supple CV:  RRR, no S4 or rub Abd:  Soft, obese Lungs:  Rales to 1/3 up fields upright, normal WOB, a few scattered rhonchi and wheeze GU: no foley Extr: 1+ pitting to mid thigh Neuro: no asterixis, fluidly conversant Skin: chronic venous stasis changes, dry scaling skin on feet and LE   Results for orders placed or performed during the hospital encounter of 08/04/18 (from the past 48 hour(s))  CBC with Differential     Status: Abnormal   Collection Time: 08/04/18  7:48 PM  Result Value Ref Range   WBC 13.2 (H) 4.0 - 10.5 K/uL   RBC 4.68 4.22 - 5.81 MIL/uL   Hemoglobin 14.0 13.0 - 17.0 g/dL   HCT 44.7 39.0 - 52.0 %   MCV 95.5 80.0 - 100.0 fL   MCH 29.9 26.0 - 34.0 pg   MCHC 31.3 30.0 - 36.0 g/dL   RDW 14.1 11.5 - 15.5 %   Platelets 226 150 - 400 K/uL   nRBC 0.0 0.0 - 0.2 %   Neutrophils Relative % 79 %   Neutro Abs 10.5 (H) 1.7 - 7.7 K/uL   Lymphocytes Relative 12 %   Lymphs Abs 1.5 0.7 - 4.0 K/uL   Monocytes Relative 7 %   Monocytes Absolute 0.9 0.1 - 1.0 K/uL   Eosinophils Relative 1 %   Eosinophils Absolute 0.1 0.0 - 0.5 K/uL   Basophils Relative 1 %   Basophils Absolute 0.1 0.0 - 0.1 K/uL   Immature Granulocytes 0 %   Abs Immature Granulocytes 0.05 0.00 - 0.07 K/uL    Comment: Performed at [facility], [address] [place], [place]  [address]  Comprehensive metabolic panel     Status: Abnormal   Collection Time: 08/04/18  7:48 PM  Result Value Ref Range   Sodium 133 (L) 135 - 145 mmol/L   Potassium 4.1 3.5 - 5.1 mmol/L   Chloride 98 98 - 111 mmol/L   CO2 24 22 - 32 mmol/L   Glucose, Bld 164 (H) 70 - 99 mg/dL   BUN 67 (H) 8 - 23 mg/dL   Creatinine, Ser 4.09 (H) 0.61 - 1.24 mg/dL   Calcium 9.4 8.9 - 10.3 mg/dL   Total Protein 7.1 6.5 - 8.1 g/dL   Albumin 4.2 3.5 - 5.0 g/dL   AST 13 (L) 15 -  41 U/L   ALT 12 0 - 44 U/L   Alkaline Phosphatase 72 38 - 126 U/L   Total Bilirubin 0.4 0.3 - 1.2 mg/dL   GFR calc non Af Amer 14 (L) >60 mL/min   GFR calc Af Amer 16 (L) >60 mL/min   Anion gap 11 5 - 15    Comment: Performed at [facility], [address], [place], [place] [address]  Brain natriuretic peptide     Status: Abnormal   Collection Time: 08/04/18  7:48 PM  Result Value Ref Range   B Natriuretic Peptide 815.0 (H) 0.0 - 100.0 pg/mL    Comment: Performed at [facility], [address] [place], [place] [address]  Troponin I - ONCE - STAT     Status: Abnormal   Collection Time: 08/04/18  7:48 PM  Result Value Ref Range   Troponin I 0.03 (HH) <0.03 ng/mL    Comment: CRITICAL RESULT CALLED TO, READ BACK BY AND VERIFIED WITH: SHORE,L ON 08/04/18 AT 2055 BY LOY,C Performed at [facility], [address] [place], [place] [address]   Protime-INR     Status: Abnormal   Collection Time: 08/04/18  7:48 PM  Result Value Ref Range   Prothrombin Time 19.2 (H) 11.4 - 15.2 seconds   INR 1.6 (H) 0.8 - 1.2    Comment: (NOTE) INR goal varies based on device and disease states. Performed at [facility], [address] [place], [place] [address]   Troponin I - Once     Status: Abnormal   Collection Time: 08/04/18 11:01 PM  Result Value Ref Range   Troponin I 0.03 (HH) <0.03 ng/mL    Comment: CRITICAL VALUE NOTED.  VALUE IS CONSISTENT WITH PREVIOUSLY REPORTED AND CALLED VALUE. Performed at [facility], [address], [place], [place] [address]   Basic metabolic panel     Status: Abnormal   Collection Time: 08/05/18  6:40 AM  Result Value Ref Range   Sodium 134 (L) 135 - 145 mmol/L   Potassium 3.9 3.5 - 5.1 mmol/L   Chloride 100 98 - 111 mmol/L   CO2 24 22 - 32 mmol/L   Glucose, Bld 88 70 - 99 mg/dL   BUN 68 (H) 8 - 23 mg/dL   Creatinine, Ser 3.95 (H) 0.61 - 1.24 mg/dL   Calcium 9.0 8.9 - 10.3 mg/dL   GFR calc non Af Amer 14 (L) >60 mL/min   GFR calc Af Amer 16 (L) >60 mL/min    Anion gap 10 5 - 15    Comment: Performed at [facility], [address], [place], [place] [address]  CBC     Status: Abnormal   Collection Time: 08/05/18  6:40 AM  Result Value Ref Range   WBC 10.4 4.0 - 10.5 K/uL  RBC 4.18 (L) 4.22 - 5.81 MIL/uL   Hemoglobin 12.5 (L) 13.0 - 17.0 g/dL   HCT 39.5 39.0 - 52.0 %   MCV 94.5 80.0 - 100.0 fL   MCH 29.9 26.0 - 34.0 pg   MCHC 31.6 30.0 - 36.0 g/dL   RDW 13.9 11.5 - 15.5 %   Platelets 195 150 - 400 K/uL   nRBC 0.0 0.0 - 0.2 %    Comment: Performed at [facility], [address], [place], [place] [address]  Protime-INR     Status: Abnormal   Collection Time: 08/05/18  6:40 AM  Result Value Ref Range   Prothrombin Time 20.1 (H) 11.4 - 15.2 seconds   INR 1.7 (H) 0.8 - 1.2    Comment: (NOTE) INR goal varies based on device and disease states. Performed at [facility], [address] [place], [place] [address]     Dg Chest 2 View  Result Date: 08/04/2018 CLINICAL DATA:  Hypotensive, shortness of breath. History of end-stage renal disease, RIGHT VATS for empyema. EXAM: CHEST - 2 VIEW COMPARISON:  CT chest September 06, 2016 FINDINGS: Cardiac silhouette is moderately enlarged. Calcified aortic arch. Status post median sternotomy for CABG. Pulmonary vascular congestion without pleural effusion or focal consolidation. No pneumothorax. Linear calcifications in the neck are likely vascular. IMPRESSION: 1. Moderate cardiomegaly and pulmonary vascular congestion. No acute pulmonary process. 2.  Aortic Atherosclerosis (ICD10-I70.0). Electronically Signed   By: Elon Alas M.D.   On: 08/04/2018 19:55    Assessment/Plan ** Dyspnea secondary to pulmonary edema on underlying COPD:   Started lasix 80 IV BID - I/Os not tracked overnight.  Pt reports no diuresis yet and has only ~153m in urinal.  Lasix 160 IV BID ordered, 1st dose now.  If not diuresing will add metolazone.  If medical diuresis ineffective may need RRT but I don't think it will be  necessary.   ** CKD 5:  No uremic symptoms, labs ok.  I think we can medically manage his hypervolemia with diuretics.    ** BMM: Phos, Ca and PTH are all acceptable. On calcitriol 0.25 daily.   **Anemia of CKD: Hb 14, in indications for ESA.    **HTN: amlodipine 5, bisoprolol 5 daily.  Initially normotensive, into 100s/50s this AM.  Dec amlodipine 2.5 daily.  Watch with diuresis.   **BPH: continue finasteride and tamsulosin.  Check PVR.   LJustin Mend3/14/2020, 12:19 PM

## 2018-08-06 ENCOUNTER — Observation Stay (HOSPITAL_COMMUNITY): Payer: No Typology Code available for payment source

## 2018-08-06 DIAGNOSIS — I2510 Atherosclerotic heart disease of native coronary artery without angina pectoris: Secondary | ICD-10-CM | POA: Diagnosis present

## 2018-08-06 DIAGNOSIS — Z9981 Dependence on supplemental oxygen: Secondary | ICD-10-CM | POA: Diagnosis not present

## 2018-08-06 DIAGNOSIS — R0602 Shortness of breath: Secondary | ICD-10-CM | POA: Diagnosis not present

## 2018-08-06 DIAGNOSIS — J811 Chronic pulmonary edema: Secondary | ICD-10-CM | POA: Diagnosis present

## 2018-08-06 DIAGNOSIS — K219 Gastro-esophageal reflux disease without esophagitis: Secondary | ICD-10-CM | POA: Diagnosis present

## 2018-08-06 DIAGNOSIS — Z8249 Family history of ischemic heart disease and other diseases of the circulatory system: Secondary | ICD-10-CM | POA: Diagnosis not present

## 2018-08-06 DIAGNOSIS — G4733 Obstructive sleep apnea (adult) (pediatric): Secondary | ICD-10-CM | POA: Diagnosis present

## 2018-08-06 DIAGNOSIS — I482 Chronic atrial fibrillation, unspecified: Secondary | ICD-10-CM | POA: Diagnosis present

## 2018-08-06 DIAGNOSIS — E785 Hyperlipidemia, unspecified: Secondary | ICD-10-CM | POA: Diagnosis present

## 2018-08-06 DIAGNOSIS — I132 Hypertensive heart and chronic kidney disease with heart failure and with stage 5 chronic kidney disease, or end stage renal disease: Secondary | ICD-10-CM | POA: Diagnosis present

## 2018-08-06 DIAGNOSIS — Z79899 Other long term (current) drug therapy: Secondary | ICD-10-CM | POA: Diagnosis not present

## 2018-08-06 DIAGNOSIS — I252 Old myocardial infarction: Secondary | ICD-10-CM | POA: Diagnosis not present

## 2018-08-06 DIAGNOSIS — N400 Enlarged prostate without lower urinary tract symptoms: Secondary | ICD-10-CM | POA: Diagnosis present

## 2018-08-06 DIAGNOSIS — N185 Chronic kidney disease, stage 5: Secondary | ICD-10-CM | POA: Diagnosis not present

## 2018-08-06 DIAGNOSIS — Z7901 Long term (current) use of anticoagulants: Secondary | ICD-10-CM | POA: Diagnosis not present

## 2018-08-06 DIAGNOSIS — I714 Abdominal aortic aneurysm, without rupture: Secondary | ICD-10-CM | POA: Diagnosis not present

## 2018-08-06 DIAGNOSIS — R001 Bradycardia, unspecified: Secondary | ICD-10-CM | POA: Diagnosis not present

## 2018-08-06 DIAGNOSIS — Z951 Presence of aortocoronary bypass graft: Secondary | ICD-10-CM | POA: Diagnosis not present

## 2018-08-06 DIAGNOSIS — I120 Hypertensive chronic kidney disease with stage 5 chronic kidney disease or end stage renal disease: Secondary | ICD-10-CM | POA: Diagnosis not present

## 2018-08-06 DIAGNOSIS — D631 Anemia in chronic kidney disease: Secondary | ICD-10-CM | POA: Diagnosis not present

## 2018-08-06 DIAGNOSIS — E8770 Fluid overload, unspecified: Secondary | ICD-10-CM | POA: Diagnosis present

## 2018-08-06 DIAGNOSIS — E1122 Type 2 diabetes mellitus with diabetic chronic kidney disease: Secondary | ICD-10-CM | POA: Diagnosis present

## 2018-08-06 DIAGNOSIS — I5033 Acute on chronic diastolic (congestive) heart failure: Secondary | ICD-10-CM

## 2018-08-06 DIAGNOSIS — J810 Acute pulmonary edema: Secondary | ICD-10-CM | POA: Diagnosis not present

## 2018-08-06 DIAGNOSIS — F17210 Nicotine dependence, cigarettes, uncomplicated: Secondary | ICD-10-CM | POA: Diagnosis present

## 2018-08-06 DIAGNOSIS — J9601 Acute respiratory failure with hypoxia: Secondary | ICD-10-CM | POA: Diagnosis not present

## 2018-08-06 DIAGNOSIS — E1151 Type 2 diabetes mellitus with diabetic peripheral angiopathy without gangrene: Secondary | ICD-10-CM | POA: Diagnosis present

## 2018-08-06 DIAGNOSIS — I5020 Unspecified systolic (congestive) heart failure: Secondary | ICD-10-CM | POA: Diagnosis not present

## 2018-08-06 DIAGNOSIS — J449 Chronic obstructive pulmonary disease, unspecified: Secondary | ICD-10-CM | POA: Diagnosis present

## 2018-08-06 LAB — BASIC METABOLIC PANEL
Anion gap: 11 (ref 5–15)
BUN: 74 mg/dL — ABNORMAL HIGH (ref 8–23)
CO2: 25 mmol/L (ref 22–32)
Calcium: 9.2 mg/dL (ref 8.9–10.3)
Chloride: 99 mmol/L (ref 98–111)
Creatinine, Ser: 4.16 mg/dL — ABNORMAL HIGH (ref 0.61–1.24)
GFR calc Af Amer: 15 mL/min — ABNORMAL LOW (ref >60)
GFR calc non Af Amer: 13 mL/min — ABNORMAL LOW (ref >60)
Glucose, Bld: 88 mg/dL (ref 70–99)
Potassium: 4.2 mmol/L (ref 3.5–5.1)
Sodium: 135 mmol/L (ref 135–145)

## 2018-08-06 LAB — CBC
HCT: 39 % (ref 39.0–52.0)
Hemoglobin: 12.3 g/dL — ABNORMAL LOW (ref 13.0–17.0)
MCH: 29.9 pg (ref 26.0–34.0)
MCHC: 31.5 g/dL (ref 30.0–36.0)
MCV: 94.9 fL (ref 80.0–100.0)
Platelets: 185 10*3/uL (ref 150–400)
RBC: 4.11 MIL/uL — ABNORMAL LOW (ref 4.22–5.81)
RDW: 14 % (ref 11.5–15.5)
WBC: 8.5 10*3/uL (ref 4.0–10.5)
nRBC: 0 % (ref 0.0–0.2)

## 2018-08-06 LAB — PROTIME-INR
INR: 1.7 — ABNORMAL HIGH (ref 0.8–1.2)
Prothrombin Time: 19.4 seconds — ABNORMAL HIGH (ref 11.4–15.2)

## 2018-08-06 MED ORDER — HYDROCERIN EX CREA
TOPICAL_CREAM | Freq: Two times a day (BID) | CUTANEOUS | Status: DC | PRN
  Filled 2018-08-06: qty 113

## 2018-08-06 MED ORDER — WARFARIN SODIUM 5 MG PO TABS
5.0000 mg | ORAL_TABLET | Freq: Once | ORAL | Status: AC
  Administered 2018-08-06: 5 mg via ORAL
  Filled 2018-08-06: qty 1

## 2018-08-06 NOTE — Progress Notes (Signed)
ANTICOAGULATION CONSULT NOTE - Follow up [place] for Coumadin Indication: atrial fibrillation  Allergies  Allergen Reactions  . Niaspan [Niacin Er] Hives    Patient Measurements: Height: 5\' 7"  (170.2 cm) Weight: 186 lb 8.2 oz (84.6 kg) IBW/kg (Calculated) : 66.1   Vital Signs: Temp: 98.4 F (36.9 C) (03/15 0512) Temp Source: Oral (03/15 0512) BP: 136/59 (03/15 0512) Pulse Rate: 41 (03/15 0512)  Labs: Recent Labs    08/04/18 1948 08/04/18 2301 08/05/18 0640 08/06/18 0645  HGB 14.0  --  12.5* 12.3*  HCT 44.7  --  39.5 39.0  PLT 226  --  195 185  LABPROT 19.2*  --  20.1* 19.4*  INR 1.6*  --  1.7* 1.7*  CREATININE 4.09*  --  3.95* 4.16*  TROPONINI 0.03* 0.03*  --   --     Estimated Creatinine Clearance: 16.7 mL/min (A) (by C-G formula based on SCr of 4.16 mg/dL (H)).   Medical History: Past Medical History:  Diagnosis Date  . Abdominal aortic aneurysm ([place]) 01/28/10; 02/05/14   4.3x4.6cm;  now measuring 5.5 cm, status post aortobifemoral bypass grafting in January 2016 by Dr. problem along with left renal artery bypass  . Arthritis    in his back  . CAD (coronary artery disease)    myoview 04/21/11-normal, EF49%  . Carotid stenosis 01/15/08   right endarterectomy-Dr. Amedeo Plenty  . CKD (chronic kidney disease), stage III ([place])   . Diabetes ([place])    BORDERLINE  . GERD (gastroesophageal reflux disease)   . Headache    uses [employer], states he is lessening what he is taking for prep. for surgery   . Hyperlipemia   . Hypertension    stress test- done 04/2014, followed by Dr. Gwenlyn Found  . Myocardial infarction ([place]) 1996  . OSA on CPAP    CPAP q night , CPAP is through [place], states doesn't remember when he had the last study  . PAD (peripheral artery disease) ([place]) 06/12/09   a. 11/22/07 PTA & stenting right external iliac artery;bilateral iliac & PTI and stenting 1997;right ICA =>50% reduction,right SFA >50%,left ICA at stent => 50% reduction,left EIA =>  50% reduction,left ATA occluded, left SFA mid > 49% reduction;  03/2014 Angio: LRA 90, patent L Iliac stent and distal RCIA stent, large saccular AAA.  [name] Pneumonia    hosp. as a child with pneumonia, not since   . Polycythemia    H/O  . S/P CABG (coronary artery bypass graft) 1996  . Tobacco abuse     Medications:  Medications Prior to Admission  Medication Sig Dispense Refill Last Dose  . albuterol (ACCUNEB) 0.63 MG/3ML nebulizer solution Take 1 ampule by nebulization every 6 (six) hours as needed for wheezing.   08/04/2018  . amLODipine (NORVASC) 5 MG tablet Take 5 mg by mouth daily.   08/04/2018  . atorvastatin (LIPITOR) 80 MG tablet Take 80 mg by mouth at bedtime.    08/03/2018 at Unknown time  . bisoprolol (ZEBETA) 5 MG tablet Take 5 mg by mouth daily.  0 08/03/2018 at 0900  . bumetanide (BUMEX) 2 MG tablet Take 2 mg by mouth 2 (two) times daily.   08/04/2018  . calcitRIOL (ROCALTROL) 0.25 MCG capsule Take 0.25 mcg by mouth daily.   08/04/2018  . finasteride (PROSCAR) 5 MG tablet Take 5 mg by mouth daily.   08/03/2018 at Unknown time  . hydrALAZINE (APRESOLINE) 25 MG tablet Take 75 mg by mouth 2 (two) times daily.  08/04/2018  . magnesium oxide (MAG-OX) 400 MG tablet Take 400 mg by mouth 2 (two) times daily.   08/04/2018  . omeprazole (PRILOSEC) 20 MG capsule Take 20 mg by mouth at bedtime.    08/04/2018  . tamsulosin (FLOMAX) 0.4 MG CAPS capsule Take 0.4 mg by mouth as directed. Take 2 tablets daily at night   08/03/2018 at Unknown time  . torsemide (DEMADEX) 20 MG tablet Take 20 mg by mouth 4 (four) times daily.    08/04/2018  . warfarin (COUMADIN) 5 MG tablet Take 1 tablet daily except 1/2 tablet on Mondays, Wednesdays and Fridays or as directed (Patient taking differently: 5 mg. Take 1 tablet daily except 1/2 tablet on Mondays, Wednesdays and Fridays or as directed) 30 tablet 4 08/04/2018 at 0900    Assessment: 73 yo presented to ED with SOB. history significant of stage IV renal disease  with a baseline creatinine of 3.2 who has had a graft put in for the last 3 -4 years prepping to start dialysis. Patient referred for pulmonary edema and worsening renal function. Patient on chronic coumadin for afib and pharmacy asked to dose. INR remains subtherapeutic Home dose is 5mg  on Tues, Thursday, Saturday, Sunday; 2.5mg  on Monday, Wednesday, and Friday  Goal of Therapy:  INR 2-3 Monitor platelets by anticoagulation protocol: Yes   Plan:  Coumadin 5mg  po x 1 today PT-INR daily Monitor for S/S of bleeding  Isac Sarna, BS [place], BCPS Clinical Pharmacist Pager [phone] 08/06/2018,9:08 AM

## 2018-08-06 NOTE — Progress Notes (Signed)
[facility] Progress Note   Background: 30M CKD 5, CAD, DM, GERD, HTN, HL , OSA, PAD who presented with dyspnea on background of several months of worsening edema, eGFR 14.  Nephrology consulted for management.  Initially consulted yesterday, 3/14.  No uremic symptoms but volume overload.  Initiated lasix 160 IV BID, rec'd 1 dose yesterday at 5pm.  I/Os yesterday 1000/600.   Subjective:   Reports incomplete I/Os due to mixed with stool.  Feels LE edema and breathing a bit improved.  No dysgeusia.  No new complaints.  Objective Vitals:   08/05/18 0845 08/05/18 1326 08/05/18 2120 08/06/18 0512  BP: (!) 108/58 114/60 (!) 150/71 (!) 136/59  Pulse:  (!) 47 (!) 54 (!) 41  Resp:  20 20   Temp:  98.1 F (36.7 C) 98.7 F (37.1 C) 98.4 F (36.9 C)  TempSrc:  Oral Oral Oral  SpO2:  92% 90% 93%  Weight:      Height:       Physical Exam General: comfortably seated edge of bed Heart: brady and regular Lungs: no rales today, scattered rhonchi mainly in the bases Abdomen: soft, obese Extremities: 1+ pitting LE edema, slightly improved Dialysis Access:  RUE AVF t/b+ tortuous  Additional Objective Labs: Basic Metabolic Panel: Recent Labs  Lab 08/04/18 1948 08/05/18 0640 08/06/18 0645  NA 133* 134* 135  K 4.1 3.9 4.2  CL 98 100 99  CO2 24 24 25   GLUCOSE 164* 88 88  BUN 67* 68* 74*  CREATININE 4.09* 3.95* 4.16*  CALCIUM 9.4 9.0 9.2   Liver Function Tests: Recent Labs  Lab 08/04/18 1948  AST 13*  ALT 12  ALKPHOS 72  BILITOT 0.4  PROT 7.1  ALBUMIN 4.2   No results for input(s): LIPASE, AMYLASE in the last 168 hours. CBC: Recent Labs  Lab 08/04/18 1948 08/05/18 0640 08/06/18 0645  WBC 13.2* 10.4 8.5  NEUTROABS 10.5*  --   --   HGB 14.0 12.5* 12.3*  HCT 44.7 39.5 39.0  MCV 95.5 94.5 94.9  PLT 226 195 185   Blood Culture    Component Value Date/Time   SDES TISSUE RIGHT PLEURAL 12/17/2014 1420   SDES TISSUE RIGHT PLEURAL 12/17/2014 1420   SDES  TISSUE RIGHT PLEURAL 12/17/2014 1420   SPECREQUEST NONE 12/17/2014 1420   SPECREQUEST NONE 12/17/2014 1420   SPECREQUEST NONE 12/17/2014 1420   CULT  12/17/2014 1420    No Fungi Isolated in 4 Weeks Performed at [place]  12/17/2014 1420    NO GROWTH 3 DAYS Performed at [place]  12/17/2014 1420    NO ACID FAST BACILLI ISOLATED IN 6 WEEKS Performed at [place] 01/13/2015 FINAL 12/17/2014 1420   REPTSTATUS 12/21/2014 FINAL 12/17/2014 1420   REPTSTATUS 01/29/2015 FINAL 12/17/2014 1420    Cardiac Enzymes: Recent Labs  Lab 08/04/18 1948 08/04/18 2301  TROPONINI 0.03* 0.03*   CBG: No results for input(s): GLUCAP in the last 168 hours. Iron Studies: No results for input(s): IRON, TIBC, TRANSFERRIN, FERRITIN in the last 72 hours. @lablastinr3 @ Studies/Results: Dg Chest 2 View  Result Date: 08/04/2018 CLINICAL DATA:  Hypotensive, shortness of breath. History of end-stage renal disease, RIGHT VATS for empyema. EXAM: CHEST - 2 VIEW COMPARISON:  CT chest September 06, 2016 FINDINGS: Cardiac silhouette is moderately enlarged. Calcified aortic arch. Status post median sternotomy for CABG. Pulmonary vascular congestion without pleural effusion or focal consolidation.  No pneumothorax. Linear calcifications in the neck are likely vascular. IMPRESSION: 1. Moderate cardiomegaly and pulmonary vascular congestion. No acute pulmonary process. 2.  Aortic Atherosclerosis (ICD10-I70.0). Electronically Signed   By: Elon Alas M.D.   On: 08/04/2018 19:55   Medications: . sodium chloride    . furosemide 160 mg (08/06/18 0903)   . amLODipine  2.5 mg Oral Daily  . atorvastatin  80 mg Oral QHS  . bisoprolol  5 mg Oral Daily  . calcitRIOL  0.25 mcg Oral Daily  . finasteride  5 mg Oral Daily  . hydrALAZINE  75 mg Oral BID  . magnesium oxide  400 mg Oral BID  . sodium chloride flush  3 mL Intravenous Q12H  . tamsulosin  0.8 mg Oral  QHS  . warfarin  5 mg Oral Once  . Warfarin - Pharmacist Dosing Inpatient   Does not apply q1800    Assessment/Plan ** Dyspnea secondary to pulmonary edema on underlying COPD:   Slowly improving.  I/Os not accurate, continue with current diuretic with lasix 160 IV BID.  He tells me today he's allergic to metolazone (?MI - unclear details, but he will not take).  He was taking torsemide 40 BID prior to admission, still have room to increase outpt to manage vol status.    ** CKD 5:  No uremic symptoms, labs ok.  Per above, attempting to medically manage his hypervolemia with diuretics but if not able he has a mature AVF in place.    ** BMM: Phos, Ca and PTH are all acceptable. On calcitriol 0.25 daily.   **Anemia of CKD: Hb 14, in indications for ESA.    **HTN: amlodipine 5, bisoprolol 5 daily.  Initially normotensive, into 100s/50s yesterday AM.  Dec'd amlodipine 2.5 daily, hold parameters added to BB in light of bradycardia.  Watch with diuresis.  This AM normotensive.   **BPH: continue finasteride and tamsulosin.  PVR without retention.  Jannifer Hick MD 08/06/2018, 11:44 AM  [facility] Pager: [phone]

## 2018-08-06 NOTE — Progress Notes (Addendum)
PROGRESS NOTE  Barry Lawrence HDQ:[id number] DOB: 07/27/1945 DOA: 08/04/2018 PCP: Christain Sacramento, MD  HPI/Recap of past 24 hours: Early Chars. Barry Lawrence is a 73 year old male with a significant history for stage V renal disease with baseline creatinine of 3.2 months followed by Dr. Lowanda Foster, hypertension, atrial fibrillation on Coumadin GERD, type 2 diabetes mellitus, coronary artery disease, hyperlipidemia obstructive sleep apnea on CPAP peripheral vascular disease who was admitted for pulmonary edema due to progressive dyspnea and home O2 monitor showing 89%.  He is not on oxygen at home.  Subjective: Patient seen and examined at bedside with his wife in the room he is complaining that he has not put out much urine even with the IV Lasix 80 mg.  He complains of orthopnea dyspnea on exertion and edema treatment that was given to him in the emergency department at least saturating well at 97% on room air he is not in any distress  August 06, 2018 subjective: Patient seen and examined his IV Lasix was increased to 160 mg twice a day he said he did pull out some but not a whole lot he still feels bloated  Assessment/Plan: Principal Problem:   Acute on chronic diastolic heart failure (HCC) Active Problems:   CAD (coronary artery disease)   Abdominal aortic aneurysm without rupture (HCC)   CKD (chronic kidney disease), stage IV (HCC)   Bradycardia, drug induced  1.  Pulmonary edema with underlying COPD.  Patient was started on IV Lasix 80 mg IV twice daily but he is complaining put up too much so nephrology has increased to 260 mg IV.  And recommended adding metolazone if he is not still diuresing well  2.  Chronic kidney disease stage V.  Nephrology is involved and he thinks they can manage him medically.  He already has a fistula ordered but has not started hemodialysis yet.  3.  Hypertension he is on hydralazine, amlodipine and bisoprolol 5 mg daily he is bradycardic and probably needs to hold the  bisoprolol with parameters  4.  asymptomatic bradycardia likely from his beta-blocker he is on bisoprolol 5 mg.  His heart rate is in the 40s(  41,40 the highest is 47) we will currently hold his beta-blocker with parameters  6.  Anemia of chronic disease most likely secondary to his chronic kidney disease but he is normal at 14  7.  Chronic atrial fibrillation on Coumadin stable heart rate  8.  Dry scaly lower extremity.  Patient stated it has been there for a long time and is all over his body.  I will start him on Aquaphor cream  Code Status: Full  Severity of Illness: The appropriate patient status for this patient is OBSERVATION. Observation status is judged to be reasonable and necessary in order to provide the required intensity of service to ensure the patient's safety. The patient's presenting symptoms, physical exam findings, and initial radiographic and laboratory data in the context of their medical condition is felt to place them at decreased risk for further clinical deterioration. Furthermore, it is anticipated that the patient will be medically stable for discharge from the hospital within 2 midnights of admission. The following factors support the patient status of observation.   " The patient's presenting symptoms include pulmonary edema needing IV diuresis. " The physical exam findings include bilateral wheezing rales " The initial radiographic and laboratory data are  Pulmonary edema     Family Communication: Wife, Doris at bedside  Disposition Plan: Home when  stable in in the morning   Consultants:  Nephrology Dr. Johnney Ou  Procedures:  None  Antimicrobials:  None  DVT prophylaxis: Coumadin  Objective: Vitals:   08/05/18 0845 08/05/18 1326 08/05/18 2120 08/06/18 0512  BP: (!) 108/58 114/60 (!) 150/71 (!) 136/59  Pulse:  (!) 47 (!) 54 (!) 41  Resp:  20 20   Temp:  98.1 F (36.7 C) 98.7 F (37.1 C) 98.4 F (36.9 C)  TempSrc:  Oral Oral Oral  SpO2:   92% 90% 93%  Weight:      Height:        Intake/Output Summary (Last 24 hours) at 08/06/2018 1246 Last data filed at 08/06/2018 0800 Gross per 24 hour  Intake 1021.6 ml  Output 600 ml  Net 421.6 ml   Filed Weights   08/04/18 1927 08/04/18 2249  Weight: 84.8 kg 84.6 kg   Body mass index is 29.21 kg/m.  Exam:  . General: 73 y.o. year-old male well developed well nourished in no acute distress.  Alert and oriented x3. . Cardiovascular: Regular rate and rhythm with no rubs or gallops.  No thyromegaly or JVD noted.   [name] Respiratory: Scattered Rales and wheezing bilaterally, good inspiratory effort. . Abdomen: Soft nontender nondistended with normal bowel sounds x4 quadrants.  Large ventral hernia noted . Musculoskeletal: +1 pitting edema lower extremity edema. 2/4 pulses in all 4 extremities. . Skin: No ulcerative lesions noted patient has chronic stasis venous stasis dermatitis changes with hyperpigmentation and dry scaly skin on both legs and feet . Psychiatry: Mood is appropriate for condition and setting    Data Reviewed: CBC: Recent Labs  Lab 08/04/18 1948 08/05/18 0640 08/06/18 0645  WBC 13.2* 10.4 8.5  NEUTROABS 10.5*  --   --   HGB 14.0 12.5* 12.3*  HCT 44.7 39.5 39.0  MCV 95.5 94.5 94.9  PLT 226 195 878   Basic Metabolic Panel: Recent Labs  Lab 08/04/18 1948 08/05/18 0640 08/06/18 0645  NA 133* 134* 135  K 4.1 3.9 4.2  CL 98 100 99  CO2 24 24 25   GLUCOSE 164* 88 88  BUN 67* 68* 74*  CREATININE 4.09* 3.95* 4.16*  CALCIUM 9.4 9.0 9.2   GFR: Estimated Creatinine Clearance: 16.7 mL/min (A) (by C-G formula based on SCr of 4.16 mg/dL (H)). Liver Function Tests: Recent Labs  Lab 08/04/18 1948  AST 13*  ALT 12  ALKPHOS 72  BILITOT 0.4  PROT 7.1  ALBUMIN 4.2   No results for input(s): LIPASE, AMYLASE in the last 168 hours. No results for input(s): AMMONIA in the last 168 hours. Coagulation Profile: Recent Labs  Lab 08/04/18 1948 08/05/18 0640  08/06/18 0645  INR 1.6* 1.7* 1.7*   Cardiac Enzymes: Recent Labs  Lab 08/04/18 1948 08/04/18 2301  TROPONINI 0.03* 0.03*   BNP (last 3 results) No results for input(s): PROBNP in the last 8760 hours. HbA1C: No results for input(s): HGBA1C in the last 72 hours. CBG: No results for input(s): GLUCAP in the last 168 hours. Lipid Profile: No results for input(s): CHOL, HDL, LDLCALC, TRIG, CHOLHDL, LDLDIRECT in the last 72 hours. Thyroid Function Tests: No results for input(s): TSH, T4TOTAL, FREET4, T3FREE, THYROIDAB in the last 72 hours. Anemia Panel: No results for input(s): VITAMINB12, FOLATE, FERRITIN, TIBC, IRON, RETICCTPCT in the last 72 hours. Urine analysis:    Component Value Date/Time   COLORURINE YELLOW 11/29/2014 1025   APPEARANCEUR CLEAR 11/29/2014 1025   LABSPEC 1.016 11/29/2014 1025   PHURINE 5.0  11/29/2014 1025   GLUCOSEU NEGATIVE 11/29/2014 1025   HGBUR SMALL (A) 11/29/2014 1025   BILIRUBINUR NEGATIVE 11/29/2014 1025   [place] 11/29/2014 1025   PROTEINUR >300 (A) 11/29/2014 1025   UROBILINOGEN 0.2 11/29/2014 1025   NITRITE NEGATIVE 11/29/2014 1025   LEUKOCYTESUR NEGATIVE 11/29/2014 1025   Sepsis Labs: @LABRCNTIP (procalcitonin:4,lacticidven:4)  )No results found for this or any previous visit (from the past 240 hour(s)).    Studies: No results found.  Scheduled Meds: . amLODipine  2.5 mg Oral Daily  . atorvastatin  80 mg Oral QHS  . bisoprolol  5 mg Oral Daily  . calcitRIOL  0.25 mcg Oral Daily  . finasteride  5 mg Oral Daily  . hydrALAZINE  75 mg Oral BID  . magnesium oxide  400 mg Oral BID  . sodium chloride flush  3 mL Intravenous Q12H  . tamsulosin  0.8 mg Oral QHS  . warfarin  5 mg Oral Once  . Warfarin - Pharmacist Dosing Inpatient   Does not apply q1800    Continuous Infusions: . sodium chloride    . furosemide 160 mg (08/06/18 0903)     LOS: 0 days     Cristal Deer, MD [facility]  To reach me or the  doctor on call, go to: [email or web address] Password [facility]  08/06/2018, 12:46 PM

## 2018-08-06 NOTE — Plan of Care (Signed)
  Problem: Education: Goal: Knowledge of General Education information will improve Description Including pain rating scale, medication(s)/side effects and non-pharmacologic comfort measures Outcome: Progressing   Problem: Health Behavior/Discharge Planning: Goal: Ability to manage health-related needs will improve Outcome: Progressing   Problem: Clinical Measurements: Goal: Ability to maintain clinical measurements within normal limits will improve Outcome: Progressing Goal: Will remain free from infection Outcome: Progressing Goal: Diagnostic test results will improve Outcome: Progressing Goal: Respiratory complications will improve Outcome: Progressing Goal: Cardiovascular complication will be avoided Outcome: Progressing   Problem: Activity: Goal: Risk for activity intolerance will decrease Outcome: Progressing   Problem: Nutrition: Goal: Adequate nutrition will be maintained Outcome: Progressing   Problem: Coping: Goal: Level of anxiety will decrease Outcome: Progressing   Problem: Elimination: Goal: Will not experience complications related to bowel motility Outcome: Progressing Goal: Will not experience complications related to urinary retention Outcome: Progressing   Problem: Pain Managment: Goal: General experience of comfort will improve Outcome: Progressing   Problem: Safety: Goal: Ability to remain free from injury will improve Outcome: Progressing   Problem: Skin Integrity: Goal: Risk for impaired skin integrity will decrease Outcome: Progressing   Problem: Health Behavior/Discharge Planning: Goal: Ability to manage health-related needs will improve Outcome: Progressing   Problem: Clinical Measurements: Goal: Ability to maintain clinical measurements within normal limits will improve Outcome: Progressing Goal: Will remain free from infection Outcome: Progressing   Problem: Activity: Goal: Risk for activity intolerance will decrease Outcome:  Progressing

## 2018-08-07 ENCOUNTER — Encounter: Payer: Self-pay | Admitting: Cardiology

## 2018-08-07 DIAGNOSIS — J811 Chronic pulmonary edema: Secondary | ICD-10-CM

## 2018-08-07 DIAGNOSIS — I714 Abdominal aortic aneurysm, without rupture: Secondary | ICD-10-CM

## 2018-08-07 DIAGNOSIS — J810 Acute pulmonary edema: Secondary | ICD-10-CM

## 2018-08-07 DIAGNOSIS — N185 Chronic kidney disease, stage 5: Secondary | ICD-10-CM

## 2018-08-07 DIAGNOSIS — I5020 Unspecified systolic (congestive) heart failure: Secondary | ICD-10-CM

## 2018-08-07 LAB — BASIC METABOLIC PANEL
Anion gap: 12 (ref 5–15)
BUN: 82 mg/dL — ABNORMAL HIGH (ref 8–23)
CO2: 24 mmol/L (ref 22–32)
Calcium: 8.9 mg/dL (ref 8.9–10.3)
Chloride: 101 mmol/L (ref 98–111)
Creatinine, Ser: 4.24 mg/dL — ABNORMAL HIGH (ref 0.61–1.24)
GFR calc Af Amer: 15 mL/min — ABNORMAL LOW (ref >60)
GFR calc non Af Amer: 13 mL/min — ABNORMAL LOW (ref >60)
Glucose, Bld: 85 mg/dL (ref 70–99)
Potassium: 4.1 mmol/L (ref 3.5–5.1)
Sodium: 137 mmol/L (ref 135–145)

## 2018-08-07 LAB — CBC
HCT: 38.5 % — ABNORMAL LOW (ref 39.0–52.0)
Hemoglobin: 12.2 g/dL — ABNORMAL LOW (ref 13.0–17.0)
MCH: 29.8 pg (ref 26.0–34.0)
MCHC: 31.7 g/dL (ref 30.0–36.0)
MCV: 93.9 fL (ref 80.0–100.0)
Platelets: 203 10*3/uL (ref 150–400)
RBC: 4.1 MIL/uL — ABNORMAL LOW (ref 4.22–5.81)
RDW: 13.9 % (ref 11.5–15.5)
WBC: 10.4 10*3/uL (ref 4.0–10.5)
nRBC: 0 % (ref 0.0–0.2)

## 2018-08-07 LAB — PROTIME-INR
INR: 1.6 — ABNORMAL HIGH (ref 0.8–1.2)
Prothrombin Time: 18.4 seconds — ABNORMAL HIGH (ref 11.4–15.2)

## 2018-08-07 MED ORDER — WARFARIN SODIUM 7.5 MG PO TABS
7.5000 mg | ORAL_TABLET | Freq: Once | ORAL | Status: AC
  Administered 2018-08-07: 7.5 mg via ORAL
  Filled 2018-08-07: qty 1

## 2018-08-07 MED ORDER — FUROSEMIDE 80 MG PO TABS
160.0000 mg | ORAL_TABLET | Freq: Two times a day (BID) | ORAL | Status: DC
  Administered 2018-08-07 – 2018-08-08 (×2): 160 mg via ORAL
  Filled 2018-08-07 (×2): qty 2

## 2018-08-07 NOTE — Progress Notes (Addendum)
ANTICOAGULATION CONSULT NOTE - Follow up [place] for Coumadin Indication: atrial fibrillation  Allergies  Allergen Reactions  . Metolazone     Pt says had MI when took  . Niaspan [Niacin Er] Hives    Patient Measurements: Height: 5\' 7"  (170.2 cm) Weight: 186 lb 15.2 oz (84.8 kg) IBW/kg (Calculated) : 66.1   Vital Signs: Temp: 97.6 F (36.4 C) (03/16 0608) Temp Source: Oral (03/16 0608) BP: 140/59 (03/16 0608) Pulse Rate: 53 (03/16 0608)  Labs: Recent Labs    08/04/18 1948 08/04/18 2301 08/05/18 0640 08/06/18 0645 08/07/18 0528  HGB 14.0  --  12.5* 12.3* 12.2*  HCT 44.7  --  39.5 39.0 38.5*  PLT 226  --  195 185 203  LABPROT 19.2*  --  20.1* 19.4* 18.4*  INR 1.6*  --  1.7* 1.7* 1.6*  CREATININE 4.09*  --  3.95* 4.16* 4.24*  TROPONINI 0.03* 0.03*  --   --   --     Estimated Creatinine Clearance: 16.4 mL/min (A) (by C-G formula based on SCr of 4.24 mg/dL (H)).   Medical History: Past Medical History:  Diagnosis Date  . Abdominal aortic aneurysm ([place]) 01/28/10; 02/05/14   4.3x4.6cm;  now measuring 5.5 cm, status post aortobifemoral bypass grafting in January 2016 by Dr. problem along with left renal artery bypass  . Arthritis    in his back  . CAD (coronary artery disease)    myoview 04/21/11-normal, EF49%  . Carotid stenosis 01/15/08   right endarterectomy-Dr. Amedeo Plenty  . CKD (chronic kidney disease), stage III ([place])   . Diabetes ([place])    BORDERLINE  . GERD (gastroesophageal reflux disease)   . Headache    uses [employer], states he is lessening what he is taking for prep. for surgery   . Hyperlipemia   . Hypertension    stress test- done 04/2014, followed by Dr. Gwenlyn Found  . Myocardial infarction ([place]) 1996  . OSA on CPAP    CPAP q night , CPAP is through [place], states doesn't remember when he had the last study  . PAD (peripheral artery disease) ([place]) 06/12/09   a. 11/22/07 PTA & stenting right external iliac artery;bilateral iliac & PTI and  stenting 1997;right ICA =>50% reduction,right SFA >50%,left ICA at stent => 50% reduction,left EIA => 50% reduction,left ATA occluded, left SFA mid > 49% reduction;  03/2014 Angio: LRA 90, patent L Iliac stent and distal RCIA stent, large saccular AAA.  [name] Pneumonia    hosp. as a child with pneumonia, not since   . Polycythemia    H/O  . S/P CABG (coronary artery bypass graft) 1996  . Tobacco abuse     Medications:  Medications Prior to Admission  Medication Sig Dispense Refill Last Dose  . albuterol (ACCUNEB) 0.63 MG/3ML nebulizer solution Take 1 ampule by nebulization every 6 (six) hours as needed for wheezing.   08/04/2018  . amLODipine (NORVASC) 5 MG tablet Take 5 mg by mouth daily.   08/04/2018  . atorvastatin (LIPITOR) 80 MG tablet Take 80 mg by mouth at bedtime.    08/03/2018 at Unknown time  . bisoprolol (ZEBETA) 5 MG tablet Take 5 mg by mouth daily.  0 08/03/2018 at 0900  . bumetanide (BUMEX) 2 MG tablet Take 2 mg by mouth 2 (two) times daily.   08/04/2018  . calcitRIOL (ROCALTROL) 0.25 MCG capsule Take 0.25 mcg by mouth daily.   08/04/2018  . finasteride (PROSCAR) 5 MG tablet Take 5 mg by  mouth daily.   08/03/2018 at Unknown time  . hydrALAZINE (APRESOLINE) 25 MG tablet Take 75 mg by mouth 2 (two) times daily.   08/04/2018  . magnesium oxide (MAG-OX) 400 MG tablet Take 400 mg by mouth 2 (two) times daily.   08/04/2018  . omeprazole (PRILOSEC) 20 MG capsule Take 20 mg by mouth at bedtime.    08/04/2018  . tamsulosin (FLOMAX) 0.4 MG CAPS capsule Take 0.4 mg by mouth as directed. Take 2 tablets daily at night   08/03/2018 at Unknown time  . torsemide (DEMADEX) 20 MG tablet Take 20 mg by mouth 4 (four) times daily.    08/04/2018  . warfarin (COUMADIN) 5 MG tablet Take 1 tablet daily except 1/2 tablet on Mondays, Wednesdays and Fridays or as directed (Patient taking differently: 5 mg. Take 1 tablet daily except 1/2 tablet on Mondays, Wednesdays and Fridays or as directed) 30 tablet 4 08/04/2018 at  0900    Assessment: 73 yo presented to ED with SOB. history significant of stage IV renal disease with a baseline creatinine of 3.2 who has had a graft put in for the last 3 -4 years prepping to start dialysis. Patient referred for pulmonary edema and worsening renal function. Patient on chronic coumadin for afib and pharmacy asked to dose. INR remains subtherapeutic. Will give booster dose today since no increase. Home dose is 5mg  on Tues, Thursday, Saturday, Sunday; 2.5mg  on Monday, Wednesday, and Friday  Goal of Therapy:  INR 2-3 Monitor platelets by anticoagulation protocol: Yes   Plan:  Coumadin 7.5mg  po x 1 today PT-INR daily Monitor for S/S of bleeding  Isac Sarna, BS [place], BCPS Clinical Pharmacist Pager [phone] 08/07/2018,8:50 AM

## 2018-08-07 NOTE — Plan of Care (Signed)
  Problem: Education: Goal: Knowledge of General Education information will improve Description Including pain rating scale, medication(s)/side effects and non-pharmacologic comfort measures Outcome: Progressing   Problem: Health Behavior/Discharge Planning: Goal: Ability to manage health-related needs will improve Outcome: Progressing   Problem: Clinical Measurements: Goal: Ability to maintain clinical measurements within normal limits will improve Outcome: Progressing Goal: Will remain free from infection Outcome: Progressing Goal: Diagnostic test results will improve Outcome: Progressing Goal: Respiratory complications will improve Outcome: Progressing Goal: Cardiovascular complication will be avoided Outcome: Progressing   Problem: Activity: Goal: Risk for activity intolerance will decrease Outcome: Progressing   Problem: Nutrition: Goal: Adequate nutrition will be maintained Outcome: Progressing   Problem: Coping: Goal: Level of anxiety will decrease Outcome: Progressing   Problem: Elimination: Goal: Will not experience complications related to bowel motility Outcome: Progressing Goal: Will not experience complications related to urinary retention Outcome: Progressing   Problem: Pain Managment: Goal: General experience of comfort will improve Outcome: Progressing   Problem: Safety: Goal: Ability to remain free from injury will improve Outcome: Progressing   Problem: Skin Integrity: Goal: Risk for impaired skin integrity will decrease Outcome: Progressing   Problem: Health Behavior/Discharge Planning: Goal: Ability to manage health-related needs will improve Outcome: Progressing   Problem: Clinical Measurements: Goal: Ability to maintain clinical measurements within normal limits will improve Outcome: Progressing Goal: Will remain free from infection Outcome: Progressing   Problem: Activity: Goal: Risk for activity intolerance will decrease Outcome:  Progressing

## 2018-08-07 NOTE — Progress Notes (Signed)
PROGRESS NOTE  Barry Lawrence YOV:[id number] DOB: [birth year] DOA: 08/04/2018 PCP: Christain [place], MD  Brief History:  73 year old male with history of CKD stage V, COPD, hyperlipidemia, COPD, diastolic CHF, hypertension, chronic atrial fibrillation presenting with 2-week history of increasing shortness of breath and dyspnea on exertion with associated increasing ankle edema, orthopnea, and PND type symptoms.  The patient endorsed compliance with all his medications.  He was taking torsemide 80 mg daily. He had severe AKI 3-4 years ago requiring HD (AAA repair) and actually had AVF placed in prep for HD but has not required use of it yet as he recovered to CKD 3b-4.    The patient presented to the ED on 08/04/2018 with increasing shortness of breath and hypoxia on a home O2 monitor.  Chest x-ray showed pulmonary vascular congestion.  Serum creatinine was 4.09 on the day of presentation.  The patient was started on IV furosemide 80 mg twice daily.  Nephrology was consulted assist with management.  Assessment/Plan: Fluid overload/pulmonary edema -Secondary to progression of underlying renal disease -Appreciate nephrology follow-up -Symptomatically improving -Stable on room air  Acute respiratory failure with hypoxia -Initially required 2 L nasal cannula -Now stable on room air -Secondary to pulmonary edema  CKD stage V -Previous baseline creatinine 3.2-3.5 -Appreciate nephrology follow-up  Essential hypertension -Continue amlodipine 2.5 mg daily -Holding bisoprolol secondary to bradycardia -Continue hydralazine  Chronic atrial fibrillation -Holding bisoprolol secondary to bradycardia -Continue warfarin  COPD -Stable on room air  Anemia of CKD -Hemoglobin stable -No indication for ESA  BPH -Continue tamsulosin and finasteride  Tobacco abuse -no desire to quit I have discussed tobacco cessation with the patient.  I have counseled the patient regarding the negative  impacts of continued tobacco use including but not limited to lung cancer, COPD, and cardiovascular disease.  I have discussed alternatives to tobacco and modalities that may help facilitate tobacco cessation including but not limited to biofeedback, hypnosis, and medications.  Total time spent with tobacco counseling was 4 minutes.     Disposition Plan:   Home 08/08/18 if cleared by nephrology Family Communication:   Spouse updated at bedside 3/16  Consultants:  renal  Code Status:  FULL  DVT Prophylaxis: warfarin   Procedures: As Listed in Progress Note Above  Antibiotics: None  RN Pressure Injury Documentation:      Subjective: Patient denies fevers, chills, headache, chest pain, nausea, vomiting, diarrhea, abdominal pain, dysuria, hematuria, hematochezia, and melena. Still having some dyspnea on exertion, but improving.  No n/v/d, abd pain.  Has nonproductive cough   Objective: Vitals:   08/06/18 1200 08/06/18 1438 08/06/18 2149 08/07/18 0608  BP: (!) 138/58 (!) 148/66 (!) 154/64 (!) 140/59  Pulse: (!) 42 (!) 45 (!) 41 (!) 53  Resp:  18 16 18   Temp:  97.8 F (36.6 C) 98 F (36.7 C) 97.6 F (36.4 C)  TempSrc:  Oral Oral Oral  SpO2:  94% 95% 93%  Weight:    84.8 kg  Height:        Intake/Output Summary (Last 24 hours) at 08/07/2018 1455 Last data filed at 08/07/2018 0641 Gross per 24 hour  Intake 340 ml  Output 2600 ml  Net -2260 ml   Weight change:  Exam:   General:  Pt is alert, follows commands appropriately, not in acute distress  HEENT: No icterus, No thrush, No neck mass, [place]/AT  Cardiovascular: RRR, S1/S2, no rubs, no gallops  Respiratory: CTA bilaterally, no wheezing, no crackles, no rhonchi  Abdomen: Soft/+BS, non tender, non distended, no guarding  Extremities: trace LE edema, No lymphangitis, No petechiae, No rashes, no synovitis   Data Reviewed: I have personally reviewed following labs and imaging studies Basic Metabolic Panel:  Recent Labs  Lab 08/04/18 1948 08/05/18 0640 08/06/18 0645 08/07/18 0528  NA 133* 134* 135 137  K 4.1 3.9 4.2 4.1  CL 98 100 99 101  CO2 24 24 25 24   GLUCOSE 164* 88 88 85  BUN 67* 68* 74* 82*  CREATININE 4.09* 3.95* 4.16* 4.24*  CALCIUM 9.4 9.0 9.2 8.9   Liver Function Tests: Recent Labs  Lab 08/04/18 1948  AST 13*  ALT 12  ALKPHOS 72  BILITOT 0.4  PROT 7.1  ALBUMIN 4.2   No results for input(s): LIPASE, AMYLASE in the last 168 hours. No results for input(s): AMMONIA in the last 168 hours. Coagulation Profile: Recent Labs  Lab 08/04/18 1948 08/05/18 0640 08/06/18 0645 08/07/18 0528  INR 1.6* 1.7* 1.7* 1.6*   CBC: Recent Labs  Lab 08/04/18 1948 08/05/18 0640 08/06/18 0645 08/07/18 0528  WBC 13.2* 10.4 8.5 10.4  NEUTROABS 10.5*  --   --   --   HGB 14.0 12.5* 12.3* 12.2*  HCT 44.7 39.5 39.0 38.5*  MCV 95.5 94.5 94.9 93.9  PLT 226 195 185 203   Cardiac Enzymes: Recent Labs  Lab 08/04/18 1948 08/04/18 2301  TROPONINI 0.03* 0.03*   BNP: Invalid input(s): POCBNP CBG: No results for input(s): GLUCAP in the last 168 hours. HbA1C: No results for input(s): HGBA1C in the last 72 hours. Urine analysis:    Component Value Date/Time   COLORURINE YELLOW 11/29/2014 1025   APPEARANCEUR CLEAR 11/29/2014 1025   LABSPEC 1.016 11/29/2014 1025   PHURINE 5.0 11/29/2014 1025   GLUCOSEU NEGATIVE 11/29/2014 1025   HGBUR SMALL (A) 11/29/2014 1025   BILIRUBINUR NEGATIVE 11/29/2014 1025   KETONESUR NEGATIVE 11/29/2014 1025   PROTEINUR >300 (A) 11/29/2014 1025   UROBILINOGEN 0.2 11/29/2014 1025   NITRITE NEGATIVE 11/29/2014 1025   LEUKOCYTESUR NEGATIVE 11/29/2014 1025   Sepsis Labs: @LABRCNTIP (procalcitonin:4,lacticidven:4) )No results found for this or any previous visit (from the past 240 hour(s)).   Scheduled Meds: . amLODipine  2.5 mg Oral Daily  . atorvastatin  80 mg Oral QHS  . calcitRIOL  0.25 mcg Oral Daily  . finasteride  5 mg Oral Daily  .  furosemide  160 mg Oral BID  . hydrALAZINE  75 mg Oral BID  . magnesium oxide  400 mg Oral BID  . sodium chloride flush  3 mL Intravenous Q12H  . tamsulosin  0.8 mg Oral QHS  . warfarin  7.5 mg Oral Once  . Warfarin - Pharmacist Dosing Inpatient   Does not apply q1800   Continuous Infusions: . sodium chloride      Procedures/Studies: Dg Chest 2 View  Result Date: 08/06/2018 CLINICAL DATA:  Shortness of breath for 2 weeks. EXAM: CHEST - 2 VIEW COMPARISON:  08/04/2018 FINDINGS: The heart is enlarged but stable. 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 Stable tortuosity and calcification of the thoracic aorta. Persistent central vascular congestion without overt pulmonary edema. No pleural effusions or definite infiltrates. The bony thorax is intact. IMPRESSION: Cardiac enlargement and vascular congestion without overt pulmonary edema or pleural effusions. Electronically Signed   By: Marijo Sanes M.D.   On: 08/06/2018 14:41   Dg Chest 2 View  Result Date: 08/04/2018 CLINICAL DATA:  Hypotensive, shortness of breath. History of end-stage renal disease, RIGHT VATS for empyema. EXAM: CHEST - 2 VIEW COMPARISON:  CT chest September 06, 2016 FINDINGS: Cardiac silhouette is moderately enlarged. Calcified aortic arch. Status post median sternotomy for CABG. Pulmonary vascular congestion without pleural effusion or focal consolidation. No pneumothorax. Linear calcifications in the neck are likely vascular. IMPRESSION: 1. Moderate cardiomegaly and pulmonary vascular congestion. No acute pulmonary process. 2.  Aortic Atherosclerosis (ICD10-I70.0). Electronically Signed   By: Elon Alas M.D.   On: 08/04/2018 19:55    Orson Eva, DO  [facility] Pager [phone]  If 7PM-7AM, please contact night-coverage [email or web address] Password [email or web address] 08/07/2018, 2:55 PM   LOS: 1 day

## 2018-08-07 NOTE — Progress Notes (Addendum)
[facility] ROUNDING NOTE   Subjective:   This is a very pleasant 73 year old gentleman with CKD stage V coronary artery disease diabetes hypertension peripheral artery disease.  He presented with volume overload and has been treated with Lasix IV every 12 hours.  Blood pressure 140/59 pulse 53 temperature 97.6 O2 sats 90% room air Urine output 2200 cc 08/06/2018.  Weight  to 84.8 kg 08/07/2018  Sodium 137 potassium 4.1 chloride 101 CO2 24 BUN 82 creatinine 4.24 glucose 85 calcium 8.9 WBC 10.4 hemoglobin 12.2 platelets 203.  INR 1.6  Amlodipine 2.5 mg daily, atorvastatin 80 mg daily, bisoprolol 5 mg daily, Lasix 160 mg IV every 12 hours, hydralazine 75 mg twice daily, Calcitrol 0.25 mcg daily, Flomax 0.8 mg nightly, finasteride 5 mg daily, Coumadin, magnesium oxide 400 mg, Proventil nebulizer every 6 hours as needed  Chest x-ray 08/06/2018 cardiac enlargement vascular congestion without overt pulmonary edema.  Last 2D echo January 2019 showed normal ejection fraction     Objective:  Vital signs in last 24 hours:  Temp:  [97.6 F (36.4 C)-98 F (36.7 C)] 97.6 F (36.4 C) (03/16 0608) Pulse Rate:  [41-53] 53 (03/16 0608) Resp:  [16-18] 18 (03/16 0608) BP: (138-154)/(58-66) 140/59 (03/16 0608) SpO2:  [93 %-95 %] 93 % (03/16 0608) Weight:  [84.8 kg] 84.8 kg (03/16 0608)  Weight change:  Filed Weights   08/04/18 1927 08/04/18 2249 08/07/18 0608  Weight: 84.8 kg 84.6 kg 84.8 kg    Intake/Output: I/O last 3 completed shifts: In: 26 [P.O.:720; IV Piggyback:100] Out: 3100 [Urine:3100]   Intake/Output this shift:  No intake/output data recorded.  CVS-irregular rate and rhythm systolic ejection murmur RS-emphysematous changes with clear to lung fields.  No wheezes no rales ABD- BS present soft non-distended EXT- no edema   AV fistula left arm thrill and bruit   Basic Metabolic Panel: Recent Labs  Lab 08/04/18 1948 08/05/18 0640 08/06/18 0645 08/07/18 0528   NA 133* 134* 135 137  K 4.1 3.9 4.2 4.1  CL 98 100 99 101  CO2 24 24 25 24   GLUCOSE 164* 88 88 85  BUN 67* 68* 74* 82*  CREATININE 4.09* 3.95* 4.16* 4.24*  CALCIUM 9.4 9.0 9.2 8.9    Liver Function Tests: Recent Labs  Lab 08/04/18 1948  AST 13*  ALT 12  ALKPHOS 72  BILITOT 0.4  PROT 7.1  ALBUMIN 4.2   No results for input(s): LIPASE, AMYLASE in the last 168 hours. No results for input(s): AMMONIA in the last 168 hours.  CBC: Recent Labs  Lab 08/04/18 1948 08/05/18 0640 08/06/18 0645 08/07/18 0528  WBC 13.2* 10.4 8.5 10.4  NEUTROABS 10.5*  --   --   --   HGB 14.0 12.5* 12.3* 12.2*  HCT 44.7 39.5 39.0 38.5*  MCV 95.5 94.5 94.9 93.9  PLT 226 195 185 203    Cardiac Enzymes: Recent Labs  Lab 08/04/18 1948 08/04/18 2301  TROPONINI 0.03* 0.03*    BNP: Invalid input(s): POCBNP  CBG: No results for input(s): GLUCAP in the last 168 hours.  Microbiology: Results for orders placed or performed during the hospital encounter of 12/12/14  AFB culture with smear     Status: None   Collection Time: 12/13/14  4:12 PM  Result Value Ref Range Status   Specimen Description PLEURAL FLUID  Final   Special Requests NONE  Final   Acid Fast Smear   Final    NO ACID FAST BACILLI SEEN Performed at [facility]    Culture   Final    NO ACID FAST BACILLI ISOLATED IN 6 WEEKS Performed at [facility]    Report Status 01/25/2015 FINAL  Final  Body fluid culture     Status: None   Collection Time: 12/13/14  4:12 PM  Result Value Ref Range Status   Specimen Description PLEURAL FLUID  Final   Special Requests NONE  Final   Gram Stain   Final    RARE WBC PRESENT,BOTH PMN AND MONONUCLEAR NO ORGANISMS SEEN CONFIRMED BY M.VESTAL    Culture NO GROWTH 3 DAYS  Final   Report Status 12/16/2014 FINAL  Final  Fungal stain     Status: None   Collection Time: 12/13/14  4:12 PM  Result Value Ref Range Status   Specimen Description PLEURAL FLUID  Final   Special  Requests NONE  Final   Fungal Smear   Final    NO YEAST OR FUNGAL ELEMENTS SEEN Performed at [facility]    Report Status 12/15/2014 FINAL  Final  Fungus Culture with Smear     Status: None   Collection Time: 12/17/14  2:12 PM  Result Value Ref Range Status   Specimen Description FLUID RIGHT PLEURAL  Final   Special Requests NONE  Final   Fungal Smear   Final    NO YEAST OR FUNGAL ELEMENTS SEEN Performed at [facility]    Culture   Final    No Fungi Isolated in 4 Weeks Performed at [facility]    Report Status 01/14/2015 FINAL  Final  AFB culture with smear     Status: None   Collection Time: 12/17/14  2:12 PM  Result Value Ref Range Status   Specimen Description FLUID RIGHT PLEURAL  Final   Special Requests NONE  Final   Acid Fast Smear   Final    NO ACID FAST BACILLI SEEN Performed at [facility]    Culture   Final    NO ACID FAST BACILLI ISOLATED IN 6 WEEKS Performed at [facility]    Report Status 01/29/2015 FINAL  Final  Culture, body fluid-bottle     Status: None   Collection Time: 12/17/14  2:12 PM  Result Value Ref Range Status   Specimen Description FLUID RIGHT PLEURAL  Final   Special Requests NONE  Final   Culture NO GROWTH 5 DAYS  Final   Report Status 12/22/2014 FINAL  Final  Gram stain     Status: None   Collection Time: 12/17/14  2:12 PM  Result Value Ref Range Status   Specimen Description FLUID RIGHT PLEURAL  Final   Special Requests NONE  Final   Gram Stain   Final    ABUNDANT WBC PRESENT,BOTH PMN AND MONONUCLEAR NO ORGANISMS SEEN CONFIRMED BY K. BARR    Report Status 12/18/2014 FINAL  Final  Fungus Culture with Smear     Status: None   Collection Time: 12/17/14  2:20 PM  Result Value Ref Range Status   Specimen Description TISSUE RIGHT PLEURAL  Final   Special Requests NONE  Final   Fungal Smear   Final    NO YEAST OR FUNGAL ELEMENTS SEEN Performed at [facility]    Culture   Final     No Fungi Isolated in 4 Weeks Performed at [facility]    Report Status 01/13/2015 FINAL  Final  Tissue culture     Status: None  Collection Time: 12/17/14  2:20 PM  Result Value Ref Range Status   Specimen Description TISSUE RIGHT PLEURAL  Final   Special Requests NONE  Final   Gram Stain   Final    MODERATE WBC PRESENT, PREDOMINANTLY MONONUCLEAR NO ORGANISMS SEEN Performed at [facility]    Culture   Final    NO GROWTH 3 DAYS Performed at [facility]    Report Status 12/21/2014 FINAL  Final  AFB culture with smear     Status: None   Collection Time: 12/17/14  2:20 PM  Result Value Ref Range Status   Specimen Description TISSUE RIGHT PLEURAL  Final   Special Requests NONE  Final   Acid Fast Smear   Final    NO ACID FAST BACILLI SEEN Performed at [facility]    Culture   Final    NO ACID FAST BACILLI ISOLATED IN 6 WEEKS Performed at [facility]    Report Status 01/29/2015 FINAL  Final    Coagulation Studies: Recent Labs    08/04/18 1948 08/05/18 0640 08/06/18 0645 08/07/18 0528  LABPROT 19.2* 20.1* 19.4* 18.4*  INR 1.6* 1.7* 1.7* 1.6*    Urinalysis: No results for input(s): COLORURINE, LABSPEC, PHURINE, GLUCOSEU, HGBUR, BILIRUBINUR, KETONESUR, PROTEINUR, UROBILINOGEN, NITRITE, LEUKOCYTESUR in the last 72 hours.  Invalid input(s): APPERANCEUR    Imaging: Dg Chest 2 View  Result Date: 08/06/2018 CLINICAL DATA:  Shortness of breath for 2 weeks. EXAM: CHEST - 2 VIEW COMPARISON:  08/04/2018 FINDINGS: The heart is enlarged but stable. 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 Stable tortuosity and calcification of the thoracic aorta. Persistent central vascular congestion without overt pulmonary edema. No pleural effusions or definite infiltrates. The bony thorax is intact. IMPRESSION: Cardiac enlargement and vascular congestion without overt pulmonary edema or pleural effusions. Electronically Signed   By: Marijo Sanes M.D.   On: 08/06/2018 14:41     Medications:   . sodium chloride    . furosemide 160 mg (08/07/18 0922)   . amLODipine  2.5 mg Oral Daily  . atorvastatin  80 mg Oral QHS  . bisoprolol  5 mg Oral Daily  . calcitRIOL  0.25 mcg Oral Daily  . finasteride  5 mg Oral Daily  . hydrALAZINE  75 mg Oral BID  . magnesium oxide  400 mg Oral BID  . sodium chloride flush  3 mL Intravenous Q12H  . tamsulosin  0.8 mg Oral QHS  . warfarin  7.5 mg Oral Once  . Warfarin - Pharmacist Dosing Inpatient   Does not apply q1800   sodium chloride, acetaminophen, albuterol, hydrocerin, sodium chloride flush  Assessment/ Plan:   CKD stage V no uremic signs or symptoms appears medically stable.  Has AV fistula in place follow-up with Dr. Lowanda Foster.  Secondary to diabetic nephropathy.  Does not appear to have any urgency for dialysis.  Hypertension/volume.  This appears to be responding well to Lasix diuresis will change from IV to oral today.  Would recommend 160 mg oral twice daily.  It appears that he takes torsemide 40 mg twice daily.  Anemia does not appear to be an issue at this time  Bones continues calcitriol 0.25 mcg daily  Bradycardia patient is still receiving bisoprolol will discontinue bisoprolol per note of primary physician  Atrial fibrillation continues on chronic anticoagulation therapy  Benign prostatic hypertrophy continue Flomax 0.8 mg nightly and finasteride 5 mg daily.   LOS: [place] @TODAY @10 :18 AM

## 2018-08-08 DIAGNOSIS — R001 Bradycardia, unspecified: Secondary | ICD-10-CM

## 2018-08-08 DIAGNOSIS — J811 Chronic pulmonary edema: Secondary | ICD-10-CM

## 2018-08-08 DIAGNOSIS — J9601 Acute respiratory failure with hypoxia: Secondary | ICD-10-CM

## 2018-08-08 LAB — RENAL FUNCTION PANEL
Albumin: 3.5 g/dL (ref 3.5–5.0)
Anion gap: 11 (ref 5–15)
BUN: 84 mg/dL — ABNORMAL HIGH (ref 8–23)
CO2: 24 mmol/L (ref 22–32)
Calcium: 8.9 mg/dL (ref 8.9–10.3)
Chloride: 101 mmol/L (ref 98–111)
Creatinine, Ser: 4.1 mg/dL — ABNORMAL HIGH (ref 0.61–1.24)
GFR calc Af Amer: 16 mL/min — ABNORMAL LOW (ref >60)
GFR calc non Af Amer: 14 mL/min — ABNORMAL LOW (ref >60)
Glucose, Bld: 86 mg/dL (ref 70–99)
Phosphorus: 4.7 mg/dL — ABNORMAL HIGH (ref 2.5–4.6)
Potassium: 4 mmol/L (ref 3.5–5.1)
Sodium: 136 mmol/L (ref 135–145)

## 2018-08-08 LAB — PROTIME-INR
INR: 1.7 — ABNORMAL HIGH (ref 0.8–1.2)
Prothrombin Time: 19.4 seconds — ABNORMAL HIGH (ref 11.4–15.2)

## 2018-08-08 MED ORDER — FUROSEMIDE 80 MG PO TABS: 160.0000 mg | ORAL_TABLET | Freq: Two times a day (BID) | ORAL | 0 refills | Status: AC

## 2018-08-08 MED ORDER — WARFARIN SODIUM 5 MG PO TABS: 5.0000 mg | ORAL_TABLET | Freq: Once | ORAL | Status: DC

## 2018-08-08 MED ORDER — AMLODIPINE BESYLATE 2.5 MG PO TABS: 2.5000 mg | ORAL_TABLET | Freq: Every day | ORAL | 1 refills | Status: DC

## 2018-08-08 NOTE — Plan of Care (Signed)
  Problem: Education: Goal: Knowledge of General Education information will improve Description Including pain rating scale, medication(s)/side effects and non-pharmacologic comfort measures 08/08/2018 1154 by Rance Muir, RN Outcome: Adequate for Discharge 08/08/2018 0943 by Rance Muir, RN Outcome: Progressing   Problem: Health Behavior/Discharge Planning: Goal: Ability to manage health-related needs will improve 08/08/2018 1154 by Rance Muir, RN Outcome: Adequate for Discharge 08/08/2018 0943 by Rance Muir, RN Outcome: Progressing   Problem: Clinical Measurements: Goal: Ability to maintain clinical measurements within normal limits will improve 08/08/2018 1154 by Rance Muir, RN Outcome: Adequate for Discharge 08/08/2018 [place] by Rance Muir, RN Outcome: Progressing Goal: Will remain free from infection 08/08/2018 1154 by Rance Muir, RN Outcome: Adequate for Discharge 08/08/2018 [place] by Rance Muir, RN Outcome: Progressing Goal: Diagnostic test results will improve 08/08/2018 1154 by Rance Muir, RN Outcome: Adequate for Discharge 08/08/2018 [place] by Rance Muir, RN Outcome: Progressing Goal: Respiratory complications will improve 08/08/2018 1154 by Rance Muir, RN Outcome: Adequate for Discharge 08/08/2018 [place] by Rance Muir, RN Outcome: Progressing Goal: Cardiovascular complication will be avoided 08/08/2018 1154 by Rance Muir, RN Outcome: Adequate for Discharge 08/08/2018 0943 by Rance Muir, RN Outcome: Progressing   Problem: Activity: Goal: Risk for activity intolerance will decrease 08/08/2018 1154 by Rance Muir, RN Outcome: Adequate for Discharge 08/08/2018 0943 by Rance Muir, RN Outcome: Progressing   Problem: Nutrition: Goal: Adequate nutrition will be maintained 08/08/2018 1154 by Rance Muir, RN Outcome: Adequate for Discharge 08/08/2018 0943 by Rance Muir, RN Outcome: Progressing   Problem: Coping: Goal: Level of anxiety will decrease 08/08/2018 1154 by Rance Muir, RN Outcome: Adequate for  Discharge 08/08/2018 [place] by Rance Muir, RN Outcome: Progressing   Problem: Elimination: Goal: Will not experience complications related to bowel motility 08/08/2018 1154 by Rance Muir, RN Outcome: Adequate for Discharge 08/08/2018 [place] by Rance Muir, RN Outcome: Progressing Goal: Will not experience complications related to urinary retention 08/08/2018 1154 by Rance Muir, RN Outcome: Adequate for Discharge 08/08/2018 [place] by Rance Muir, RN Outcome: Progressing   Problem: Pain Managment: Goal: General experience of comfort will improve 08/08/2018 1154 by Rance Muir, RN Outcome: Adequate for Discharge 08/08/2018 [place] by Rance Muir, RN Outcome: Progressing   Problem: Safety: Goal: Ability to remain free from injury will improve 08/08/2018 1154 by Rance Muir, RN Outcome: Adequate for Discharge 08/08/2018 0943 by Rance Muir, RN Outcome: Progressing   Problem: Skin Integrity: Goal: Risk for impaired skin integrity will decrease 08/08/2018 1154 by Rance Muir, RN Outcome: Adequate for Discharge 08/08/2018 [place] by Rance Muir, RN Outcome: Progressing   Problem: Health Behavior/Discharge Planning: Goal: Ability to manage health-related needs will improve 08/08/2018 1154 by Rance Muir, RN Outcome: Adequate for Discharge 08/08/2018 [place] by Rance Muir, RN Outcome: Progressing   Problem: Clinical Measurements: Goal: Ability to maintain clinical measurements within normal limits will improve 08/08/2018 1154 by Rance Muir, RN Outcome: Adequate for Discharge 08/08/2018 [place] by Rance Muir, RN Outcome: Progressing Goal: Will remain free from infection 08/08/2018 1154 by Rance Muir, RN Outcome: Adequate for Discharge 08/08/2018 0943 by Rance Muir, RN Outcome: Progressing   Problem: Activity: Goal: Risk for activity intolerance will decrease 08/08/2018 1154 by Rance Muir, RN Outcome: Adequate for Discharge 08/08/2018 0943 by Rance Muir, RN Outcome: Progressing

## 2018-08-08 NOTE — Progress Notes (Signed)
ANTICOAGULATION CONSULT NOTE - Follow up [place] for Coumadin Indication: atrial fibrillation  Allergies  Allergen Reactions  . Metolazone     Pt says had MI when took  . Niaspan [Niacin Er] Hives    Patient Measurements: Height: 5\' 7"  (170.2 cm) Weight: 188 lb 4.4 oz (85.4 kg) IBW/kg (Calculated) : 66.1   Vital Signs: Temp: 97.9 F (36.6 C) (03/17 0614) Temp Source: Oral (03/17 0614) BP: 136/61 (03/17 0614) Pulse Rate: 60 (03/17 0614)  Labs: Recent Labs    08/06/18 0645 08/07/18 0528 08/08/18 0440  HGB 12.3* 12.2*  --   HCT 39.0 38.5*  --   PLT 185 203  --   LABPROT 19.4* 18.4* 19.4*  INR 1.7* 1.6* 1.7*  CREATININE 4.16* 4.24* 4.10*    Estimated Creatinine Clearance: 17 mL/min (A) (by C-G formula based on SCr of 4.1 mg/dL (H)).   Medical History: Past Medical History:  Diagnosis Date  . Abdominal aortic aneurysm ([place]) 01/28/10; 02/05/14   4.3x4.6cm;  now measuring 5.5 cm, status post aortobifemoral bypass grafting in January 2016 by Dr. problem along with left renal artery bypass  . Arthritis    in his back  . CAD (coronary artery disease)    myoview 04/21/11-normal, EF49%  . Carotid stenosis 01/15/08   right endarterectomy-Dr. Amedeo Plenty  . CKD (chronic kidney disease), stage III ([place])   . Diabetes ([place])    BORDERLINE  . GERD (gastroesophageal reflux disease)   . Headache    uses [employer], states he is lessening what he is taking for prep. for surgery   . Hyperlipemia   . Hypertension    stress test- done 04/2014, followed by Dr. Gwenlyn Found  . Myocardial infarction ([place]) 1996  . OSA on CPAP    CPAP q night , CPAP is through [place], states doesn't remember when he had the last study  . PAD (peripheral artery disease) ([place]) 06/12/09   a. 11/22/07 PTA & stenting right external iliac artery;bilateral iliac & PTI and stenting 1997;right ICA =>50% reduction,right SFA >50%,left ICA at stent => 50% reduction,left EIA => 50% reduction,left ATA occluded,  left SFA mid > 49% reduction;  03/2014 Angio: LRA 90, patent L Iliac stent and distal RCIA stent, large saccular AAA.  [name] Pneumonia    hosp. as a child with pneumonia, not since   . Polycythemia    H/O  . S/P CABG (coronary artery bypass graft) 1996  . Tobacco abuse     Medications:  Medications Prior to Admission  Medication Sig Dispense Refill Last Dose  . albuterol (ACCUNEB) 0.63 MG/3ML nebulizer solution Take 1 ampule by nebulization every 6 (six) hours as needed for wheezing.   08/04/2018  . amLODipine (NORVASC) 5 MG tablet Take 5 mg by mouth daily.   08/04/2018  . atorvastatin (LIPITOR) 80 MG tablet Take 80 mg by mouth at bedtime.    08/03/2018 at Unknown time  . bisoprolol (ZEBETA) 5 MG tablet Take 5 mg by mouth daily.  0 08/03/2018 at 0900  . bumetanide (BUMEX) 2 MG tablet Take 2 mg by mouth 2 (two) times daily.   08/04/2018  . calcitRIOL (ROCALTROL) 0.25 MCG capsule Take 0.25 mcg by mouth daily.   08/04/2018  . finasteride (PROSCAR) 5 MG tablet Take 5 mg by mouth daily.   08/03/2018 at Unknown time  . hydrALAZINE (APRESOLINE) 25 MG tablet Take 75 mg by mouth 2 (two) times daily.   08/04/2018  . magnesium oxide (MAG-OX) 400 MG tablet  Take 400 mg by mouth 2 (two) times daily.   08/04/2018  . omeprazole (PRILOSEC) 20 MG capsule Take 20 mg by mouth at bedtime.    08/04/2018  . tamsulosin (FLOMAX) 0.4 MG CAPS capsule Take 0.4 mg by mouth as directed. Take 2 tablets daily at night   08/03/2018 at Unknown time  . torsemide (DEMADEX) 20 MG tablet Take 20 mg by mouth 4 (four) times daily.    08/04/2018  . warfarin (COUMADIN) 5 MG tablet Take 1 tablet daily except 1/2 tablet on Mondays, Wednesdays and Fridays or as directed (Patient taking differently: 5 mg. Take 1 tablet daily except 1/2 tablet on Mondays, Wednesdays and Fridays or as directed) 30 tablet 4 08/04/2018 at 0900    Assessment: 73 yo presented to ED with SOB. history significant of stage IV renal disease with a baseline creatinine of 3.2  who has had a graft put in for the last 3 -4 years prepping to start dialysis. Patient referred for pulmonary edema and worsening renal function. Patient on chronic coumadin for afib and pharmacy asked to dose. INR remains subtherapeutic.   Home dose is 5mg  on Tues, Thursday, Saturday, Sunday; 2.5mg  on Monday, Wednesday, and Friday  Goal of Therapy:  INR 2-3 Monitor platelets by anticoagulation protocol: Yes   Plan:  Coumadin 5mg  po x 1 today PT-INR daily Monitor for S/S of bleeding  Margot Ables, PharmD Clinical Pharmacist 08/08/2018 7:46 AM

## 2018-08-08 NOTE — Care Management Important Message (Signed)
Important Message  Patient Details  Name: Barry Lawrence MRN: [id number] Date of Birth: 01-Aug-1945   Medicare Important Message Given:  Yes    Emitt Maglione, Chauncey Reading, RN 08/08/2018, 11:43 AM

## 2018-08-08 NOTE — Discharge Summary (Addendum)
Physician Discharge Summary  Barry Lawrence MWU:[id number] DOB: 1946-04-23 DOA: 08/04/2018  PCP: Christain [place], MD  Admit date: 08/04/2018 Discharge date: 08/08/2018  Admitted From: Home Disposition:  Home  Recommendations for Outpatient Follow-up:  1. Follow up with PCP in 1-2 weeks 2. Please obtain BMP/CBC in one week    Discharge Condition: Stable CODE STATUS: FULL Diet recommendation: Heart Healthy / Carb Modified   Brief/Interim Summary: 73 year old male with history of CKD stage V, COPD, hyperlipidemia, COPD, diastolic CHF, hypertension, chronic atrial fibrillation presenting with 2-week history of increasing shortness of breath and dyspnea on exertion with associated increasing ankle edema, orthopnea, and PND type symptoms.  The patient endorsed compliance with all his medications.  He was taking torsemide 80 mg daily. He had severe AKI 3-4 years agorequiring HD (AAA repair)and actually had AVFplaced in prep for HD but has not required use of it yet as he recovered to CKD 3b-4.  The patient presented to the ED on 08/04/2018 with increasing shortness of breath and hypoxia on a home O2 monitor.  Chest x-ray showed pulmonary vascular congestion.  Serum creatinine was 4.09 on the day of presentation.  The patient was started on IV furosemide 80 mg twice daily.  Nephrology was consulted assist with management.   Discharge Diagnoses:  Fluid overload/pulmonary edema--noncardiogenic -Secondary to progression of underlying renal disease -Appreciate nephrology follow-up -Symptomatically improving -Stable on room air -NEG 3 L for the admission  Acute respiratory failure with hypoxia -Initially required 2 L nasal cannula -Now stable on room air -Secondary to pulmonary edema  CKD stage V -Previous baseline creatinine 3.2-3.5 -Appreciate nephrology follow-up -serum creatinine 4.10 on day of d/c -d/c home with lasix 160 mg po bid  Essential hypertension -Decreased  amlodipine 2.5 mg daily -Discontinue bisoprolol secondary to bradycardia -Continue hydralazine  Chronic atrial fibrillation -Holding bisoprolol secondary to bradycardia -HR improved from mid 40s to mid 60s -Continue warfarin -05/26/17 Echo--EF 64%, grade 2 DD, mild TR/AI, PASP 54  COPD -Stable on room air  Anemia of CKD -Hemoglobin stable -No indication for ESA presently  BPH -Continue tamsulosin and finasteride  Tobacco abuse -no desire to quit I have discussed tobacco cessation with the patient.  I have counseled the patient regarding the negative impacts of continued tobacco use including but not limited to lung cancer, COPD, and cardiovascular disease.  I have discussed alternatives to tobacco and modalities that may help facilitate tobacco cessation including but not limited to biofeedback, hypnosis, and medications.  Total time spent with tobacco counseling was 4 minutes.    Discharge Instructions   Allergies as of 08/08/2018      Reactions   Metolazone    Pt says had MI when took   Niaspan [niacin Er] Hives      Medication List    STOP taking these medications   bisoprolol 5 MG tablet Commonly known as:  ZEBETA   bumetanide 2 MG tablet Commonly known as:  BUMEX   hydrALAZINE 25 MG tablet Commonly known as:  APRESOLINE   torsemide 20 MG tablet Commonly known as:  DEMADEX     TAKE these medications   albuterol 0.63 MG/3ML nebulizer solution Commonly known as:  ACCUNEB Take 1 ampule by nebulization every 6 (six) hours as needed for wheezing.   amLODipine 2.5 MG tablet Commonly known as:  NORVASC Take 1 tablet (2.5 mg total) by mouth daily. Start taking on:  August 09, 2018 What changed:    medication strength  how much to  take   atorvastatin 80 MG tablet Commonly known as:  LIPITOR Take 80 mg by mouth at bedtime.   calcitRIOL 0.25 MCG capsule Commonly known as:  ROCALTROL Take 0.25 mcg by mouth daily.   finasteride 5 MG tablet Commonly  known as:  PROSCAR Take 5 mg by mouth daily.   furosemide 80 MG tablet Commonly known as:  LASIX Take 2 tablets (160 mg total) by mouth 2 (two) times daily.   magnesium oxide 400 MG tablet Commonly known as:  MAG-OX Take 400 mg by mouth 2 (two) times daily.   omeprazole 20 MG capsule Commonly known as:  PRILOSEC Take 20 mg by mouth at bedtime.   tamsulosin 0.4 MG Caps capsule Commonly known as:  FLOMAX Take 0.4 mg by mouth as directed. Take 2 tablets daily at night   warfarin 5 MG tablet Commonly known as:  COUMADIN Take 1 tablet daily except 1/2 tablet on Mondays, Wednesdays and Fridays or as directed What changed:  how much to take       Allergies  Allergen Reactions   Metolazone     Pt says had MI when took   Niaspan [Niacin Er] Hives    Consultations:  renal   Procedures/Studies: Dg Chest 2 View  Result Date: 08/06/2018 CLINICAL DATA:  Shortness of breath for 2 weeks. EXAM: CHEST - 2 VIEW COMPARISON:  08/04/2018 FINDINGS: The heart is enlarged but stable. 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 Stable tortuosity and calcification of the thoracic aorta. Persistent central vascular congestion without overt pulmonary edema. No pleural effusions or definite infiltrates. The bony thorax is intact. IMPRESSION: Cardiac enlargement and vascular congestion without overt pulmonary edema or pleural effusions. Electronically Signed   By: Marijo Sanes M.D.   On: 08/06/2018 14:41   Dg Chest 2 View  Result Date: 08/04/2018 CLINICAL DATA:  Hypotensive, shortness of breath. History of end-stage renal disease, RIGHT VATS for empyema. EXAM: CHEST - 2 VIEW COMPARISON:  CT chest September 06, 2016 FINDINGS: Cardiac silhouette is moderately enlarged. Calcified aortic arch. Status post median sternotomy for CABG. Pulmonary vascular congestion without pleural effusion or focal consolidation. No pneumothorax. Linear calcifications in the neck are likely vascular. IMPRESSION: 1.  Moderate cardiomegaly and pulmonary vascular congestion. No acute pulmonary process. 2.  Aortic Atherosclerosis (ICD10-I70.0). Electronically Signed   By: Elon Alas M.D.   On: 08/04/2018 19:55        Discharge Exam: Vitals:   08/07/18 2110 08/08/18 0614  BP: 124/78 136/61  Pulse: (!) 43 60  Resp: 16 20  Temp: 99.3 F (37.4 C) 97.9 F (36.6 C)  SpO2: 95% 93%   Vitals:   08/07/18 1507 08/07/18 2110 08/08/18 0614 08/08/18 0618  BP: 119/63 124/78 136/61   Pulse: (!) 43 (!) 43 60   Resp: 16 16 20    Temp: 97.8 F (36.6 C) 99.3 F (37.4 C) 97.9 F (36.6 C)   TempSrc: Oral Oral Oral   SpO2: 95% 95% 93%   Weight:    85.4 kg  Height:        General: Pt is alert, awake, not in acute distress Cardiovascular: IRRR, S1/S2 +, no rubs, no gallops Respiratory: diminished breath sounds, no wheeze Abdominal: Soft, NT, ND, bowel sounds + Extremities: no edema, no cyanosis   The results of significant diagnostics from this hospitalization (including imaging, microbiology, ancillary and laboratory) are listed below for reference.    Significant Diagnostic Studies: Dg Chest 2 View  Result Date: 08/06/2018 CLINICAL DATA:  Shortness of breath for 2 weeks. EXAM: CHEST - 2 VIEW COMPARISON:  08/04/2018 FINDINGS: The heart is enlarged but stable. 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 Stable tortuosity and calcification of the thoracic aorta. Persistent central vascular congestion without overt pulmonary edema. No pleural effusions or definite infiltrates. The bony thorax is intact. IMPRESSION: Cardiac enlargement and vascular congestion without overt pulmonary edema or pleural effusions. Electronically Signed   By: Marijo Sanes M.D.   On: 08/06/2018 14:41   Dg Chest 2 View  Result Date: 08/04/2018 CLINICAL DATA:  Hypotensive, shortness of breath. History of end-stage renal disease, RIGHT VATS for empyema. EXAM: CHEST - 2 VIEW COMPARISON:  CT chest September 06, 2016 FINDINGS:  Cardiac silhouette is moderately enlarged. Calcified aortic arch. Status post median sternotomy for CABG. Pulmonary vascular congestion without pleural effusion or focal consolidation. No pneumothorax. Linear calcifications in the neck are likely vascular. IMPRESSION: 1. Moderate cardiomegaly and pulmonary vascular congestion. No acute pulmonary process. 2.  Aortic Atherosclerosis (ICD10-I70.0). Electronically Signed   By: Elon Alas M.D.   On: 08/04/2018 19:55     Microbiology: No results found for this or any previous visit (from the past 240 hour(s)).   Labs: Basic Metabolic Panel: Recent Labs  Lab 08/04/18 1948 08/05/18 0640 08/06/18 0645 08/07/18 0528 08/08/18 0440  NA 133* 134* 135 137 136  K 4.1 3.9 4.2 4.1 4.0  CL 98 100 99 101 101  CO2 24 24 25 24 24   GLUCOSE 164* 88 88 85 86  BUN 67* 68* 74* 82* 84*  CREATININE 4.09* 3.95* 4.16* 4.24* 4.10*  CALCIUM 9.4 9.0 9.2 8.9 8.9  PHOS  --   --   --   --  4.7*   Liver Function Tests: Recent Labs  Lab 08/04/18 1948 08/08/18 0440  AST 13*  --   ALT 12  --   ALKPHOS 72  --   BILITOT 0.4  --   PROT 7.1  --   ALBUMIN 4.2 3.5   No results for input(s): LIPASE, AMYLASE in the last 168 hours. No results for input(s): AMMONIA in the last 168 hours. CBC: Recent Labs  Lab 08/04/18 1948 08/05/18 0640 08/06/18 0645 08/07/18 0528  WBC 13.2* 10.4 8.5 10.4  NEUTROABS 10.5*  --   --   --   HGB 14.0 12.5* 12.3* 12.2*  HCT 44.7 39.5 39.0 38.5*  MCV 95.5 94.5 94.9 93.9  PLT 226 195 185 203   Cardiac Enzymes: Recent Labs  Lab 08/04/18 1948 08/04/18 2301  TROPONINI 0.03* 0.03*   BNP: Invalid input(s): POCBNP CBG: No results for input(s): GLUCAP in the last 168 hours.  Time coordinating discharge:  36 minutes  Signed:  Orson Eva, DO [facility] Pager: [phone] 08/08/2018, 12:09 PM

## 2018-08-08 NOTE — Progress Notes (Signed)
[facility] ROUNDING NOTE   Subjective:   This is a very pleasant 73 year old gentleman with CKD stage V coronary artery disease diabetes hypertension peripheral artery disease.  He presented with volume overload and has been treated with Lasix IV every 12 hours.  We have transitioned him to oral Lasix 160 mg twice daily  Blood pressure 136/61 pulse 60 temperature 97.9 O2 sats 93% room air Urine output 2600 cc 08/07/2018.  Sodium 136 potassium 4.0 chloride 101 CO2 24 BUN 84 creatinine 4.1 calcium 8.9 glucose 86 hemoglobin 12.2  Amlodipine 2.5 mg daily, atorvastatin 80 mg daily, bisoprolol 5 mg daily, Lasix 160 mg twice daily, hydralazine 75 mg twice daily, Calcitrol 0.25 mcg daily, Flomax 0.8 mg nightly, finasteride 5 mg daily, Coumadin, magnesium oxide 400 mg, Proventil nebulizer every 6 hours as needed  Chest x-ray 08/06/2018 cardiac enlargement vascular congestion without overt pulmonary edema.  Last 2D echo January 2019 showed normal ejection fraction     Objective:  Vital signs in last 24 hours:  Temp:  [97.8 F (36.6 C)-99.3 F (37.4 C)] 97.9 F (36.6 C) (03/17 0614) Pulse Rate:  [43-60] 60 (03/17 0614) Resp:  [16-20] 20 (03/17 0614) BP: (119-136)/(61-78) 136/61 (03/17 0614) SpO2:  [93 %-95 %] 93 % (03/17 0614) Weight:  [85.4 kg] 85.4 kg (03/17 0618)  Weight change: 0.6 kg Filed Weights   08/04/18 2249 08/07/18 0608 08/08/18 0618  Weight: 84.6 kg 84.8 kg 85.4 kg    Intake/Output: I/O last 3 completed shifts: In: 1183 [P.O.:1080; I.V.:3; IV Piggyback:100] Out: 4200 [Urine:4200]   Intake/Output this shift:  No intake/output data recorded.  CVS-irregular rate and rhythm systolic ejection murmur RS-emphysematous changes with clear to lung fields.  No wheezes no rales ABD- BS present soft non-distended EXT- no edema   AV fistula left arm thrill and bruit   Basic Metabolic Panel: Recent Labs  Lab 08/04/18 1948 08/05/18 0640 08/06/18 0645  08/07/18 0528 08/08/18 0440  NA 133* 134* 135 137 136  K 4.1 3.9 4.2 4.1 4.0  CL 98 100 99 101 101  CO2 24 24 25 24 24   GLUCOSE 164* 88 88 85 86  BUN 67* 68* 74* 82* 84*  CREATININE 4.09* 3.95* 4.16* 4.24* 4.10*  CALCIUM 9.4 9.0 9.2 8.9 8.9  PHOS  --   --   --   --  4.7*    Liver Function Tests: Recent Labs  Lab 08/04/18 1948 08/08/18 0440  AST 13*  --   ALT 12  --   ALKPHOS 72  --   BILITOT 0.4  --   PROT 7.1  --   ALBUMIN 4.2 3.5   No results for input(s): LIPASE, AMYLASE in the last 168 hours. No results for input(s): AMMONIA in the last 168 hours.  CBC: Recent Labs  Lab 08/04/18 1948 08/05/18 0640 08/06/18 0645 08/07/18 0528  WBC 13.2* 10.4 8.5 10.4  NEUTROABS 10.5*  --   --   --   HGB 14.0 12.5* 12.3* 12.2*  HCT 44.7 39.5 39.0 38.5*  MCV 95.5 94.5 94.9 93.9  PLT 226 195 185 203    Cardiac Enzymes: Recent Labs  Lab 08/04/18 1948 08/04/18 2301  TROPONINI 0.03* 0.03*    BNP: Invalid input(s): POCBNP  CBG: No results for input(s): GLUCAP in the last 168 hours.  Microbiology: Results for orders placed or performed during the hospital encounter of 12/12/14  AFB culture with smear     Status: None   Collection Time: 12/13/14  4:12  PM  Result Value Ref Range Status   Specimen Description PLEURAL FLUID  Final   Special Requests NONE  Final   Acid Fast Smear   Final    NO ACID FAST BACILLI SEEN Performed at [facility]    Culture   Final    NO ACID FAST BACILLI ISOLATED IN 6 WEEKS Performed at [facility]    Report Status 01/25/2015 FINAL  Final  Body fluid culture     Status: None   Collection Time: 12/13/14  4:12 PM  Result Value Ref Range Status   Specimen Description PLEURAL FLUID  Final   Special Requests NONE  Final   Gram Stain   Final    RARE WBC PRESENT,BOTH PMN AND MONONUCLEAR NO ORGANISMS SEEN CONFIRMED BY M.VESTAL    Culture NO GROWTH 3 DAYS  Final   Report Status 12/16/2014 FINAL  Final  Fungal stain      Status: None   Collection Time: 12/13/14  4:12 PM  Result Value Ref Range Status   Specimen Description PLEURAL FLUID  Final   Special Requests NONE  Final   Fungal Smear   Final    NO YEAST OR FUNGAL ELEMENTS SEEN Performed at [facility]    Report Status 12/15/2014 FINAL  Final  Fungus Culture with Smear     Status: None   Collection Time: 12/17/14  2:12 PM  Result Value Ref Range Status   Specimen Description FLUID RIGHT PLEURAL  Final   Special Requests NONE  Final   Fungal Smear   Final    NO YEAST OR FUNGAL ELEMENTS SEEN Performed at [facility]    Culture   Final    No Fungi Isolated in 4 Weeks Performed at [facility]    Report Status 01/14/2015 FINAL  Final  AFB culture with smear     Status: None   Collection Time: 12/17/14  2:12 PM  Result Value Ref Range Status   Specimen Description FLUID RIGHT PLEURAL  Final   Special Requests NONE  Final   Acid Fast Smear   Final    NO ACID FAST BACILLI SEEN Performed at [facility]    Culture   Final    NO ACID FAST BACILLI ISOLATED IN 6 WEEKS Performed at [facility]    Report Status 01/29/2015 FINAL  Final  Culture, body fluid-bottle     Status: None   Collection Time: 12/17/14  2:12 PM  Result Value Ref Range Status   Specimen Description FLUID RIGHT PLEURAL  Final   Special Requests NONE  Final   Culture NO GROWTH 5 DAYS  Final   Report Status 12/22/2014 FINAL  Final  Gram stain     Status: None   Collection Time: 12/17/14  2:12 PM  Result Value Ref Range Status   Specimen Description FLUID RIGHT PLEURAL  Final   Special Requests NONE  Final   Gram Stain   Final    ABUNDANT WBC PRESENT,BOTH PMN AND MONONUCLEAR NO ORGANISMS SEEN CONFIRMED BY K. BARR    Report Status 12/18/2014 FINAL  Final  Fungus Culture with Smear     Status: None   Collection Time: 12/17/14  2:20 PM  Result Value Ref Range Status   Specimen Description TISSUE RIGHT PLEURAL  Final   Special  Requests NONE  Final   Fungal Smear   Final    NO YEAST OR FUNGAL ELEMENTS SEEN Performed at [facility]  Culture   Final    No Fungi Isolated in 4 Weeks Performed at [facility]    Report Status 01/13/2015 FINAL  Final  Tissue culture     Status: None   Collection Time: 12/17/14  2:20 PM  Result Value Ref Range Status   Specimen Description TISSUE RIGHT PLEURAL  Final   Special Requests NONE  Final   Gram Stain   Final    MODERATE WBC PRESENT, PREDOMINANTLY MONONUCLEAR NO ORGANISMS SEEN Performed at [facility]    Culture   Final    NO GROWTH 3 DAYS Performed at [facility]    Report Status 12/21/2014 FINAL  Final  AFB culture with smear     Status: None   Collection Time: 12/17/14  2:20 PM  Result Value Ref Range Status   Specimen Description TISSUE RIGHT PLEURAL  Final   Special Requests NONE  Final   Acid Fast Smear   Final    NO ACID FAST BACILLI SEEN Performed at [facility]    Culture   Final    NO ACID FAST BACILLI ISOLATED IN 6 WEEKS Performed at [facility]    Report Status 01/29/2015 FINAL  Final    Coagulation Studies: Recent Labs    08/06/18 0645 08/07/18 0528 08/08/18 0440  LABPROT 19.4* 18.4* 19.4*  INR 1.7* 1.6* 1.7*    Urinalysis: No results for input(s): COLORURINE, LABSPEC, PHURINE, GLUCOSEU, HGBUR, BILIRUBINUR, KETONESUR, PROTEINUR, UROBILINOGEN, NITRITE, LEUKOCYTESUR in the last 72 hours.  Invalid input(s): APPERANCEUR    Imaging: Dg Chest 2 View  Result Date: 08/06/2018 CLINICAL DATA:  Shortness of breath for 2 weeks. EXAM: CHEST - 2 VIEW COMPARISON:  08/04/2018 FINDINGS: The heart is enlarged but stable. 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 Stable tortuosity and calcification of the thoracic aorta. Persistent central vascular congestion without overt pulmonary edema. No pleural effusions or definite infiltrates. The bony thorax is intact. IMPRESSION: Cardiac  enlargement and vascular congestion without overt pulmonary edema or pleural effusions. Electronically Signed   By: Marijo Sanes M.D.   On: 08/06/2018 14:41     Medications:   . sodium chloride     . amLODipine  2.5 mg Oral Daily  . atorvastatin  80 mg Oral QHS  . calcitRIOL  0.25 mcg Oral Daily  . finasteride  5 mg Oral Daily  . furosemide  160 mg Oral BID  . hydrALAZINE  75 mg Oral BID  . magnesium oxide  400 mg Oral BID  . sodium chloride flush  3 mL Intravenous Q12H  . tamsulosin  0.8 mg Oral QHS  . warfarin  5 mg Oral Once  . Warfarin - Pharmacist Dosing Inpatient   Does not apply q1800   sodium chloride, acetaminophen, albuterol, hydrocerin, sodium chloride flush  Assessment/ Plan:   CKD stage V no uremic signs or symptoms appears medically stable.  Has AV fistula in place follow-up with Dr. Lowanda Foster.  Secondary to diabetic nephropathy.  Does not appear to have any urgency for dialysis.  Hypertension/volume.  This appears to be responding well to Lasix diuresis will change from IV to oral today.  Is responding well to Lasix 160 mg twice daily we will discharge him on this dose of medication.  Anemia does not appear to be an issue at this time  Bones continues calcitriol 0.25 mcg daily  Bradycardia patient is still receiving bisoprolol will discontinue bisoprolol per note of primary physician  Atrial fibrillation continues on  chronic anticoagulation therapy  Benign prostatic hypertrophy continue Flomax 0.8 mg nightly and finasteride 5 mg daily.  Position from a renal standpoint I think Mr. Schrom is reasonable the okay for discharge.  He will need follow-up with his nephrologist Dr. Lowanda Foster   LOS: [place] @TODAY @11 :38 AM

## 2018-08-08 NOTE — Plan of Care (Signed)
  Problem: Education: Goal: Knowledge of General Education information will improve Description Including pain rating scale, medication(s)/side effects and non-pharmacologic comfort measures Outcome: Progressing   Problem: Health Behavior/Discharge Planning: Goal: Ability to manage health-related needs will improve Outcome: Progressing   Problem: Clinical Measurements: Goal: Ability to maintain clinical measurements within normal limits will improve Outcome: Progressing Goal: Will remain free from infection Outcome: Progressing Goal: Diagnostic test results will improve Outcome: Progressing Goal: Respiratory complications will improve Outcome: Progressing Goal: Cardiovascular complication will be avoided Outcome: Progressing   Problem: Activity: Goal: Risk for activity intolerance will decrease Outcome: Progressing   Problem: Nutrition: Goal: Adequate nutrition will be maintained Outcome: Progressing   Problem: Coping: Goal: Level of anxiety will decrease Outcome: Progressing   Problem: Elimination: Goal: Will not experience complications related to bowel motility Outcome: Progressing Goal: Will not experience complications related to urinary retention Outcome: Progressing   Problem: Pain Managment: Goal: General experience of comfort will improve Outcome: Progressing   Problem: Safety: Goal: Ability to remain free from injury will improve Outcome: Progressing   Problem: Skin Integrity: Goal: Risk for impaired skin integrity will decrease Outcome: Progressing   Problem: Health Behavior/Discharge Planning: Goal: Ability to manage health-related needs will improve Outcome: Progressing   Problem: Clinical Measurements: Goal: Ability to maintain clinical measurements within normal limits will improve Outcome: Progressing Goal: Will remain free from infection Outcome: Progressing   Problem: Activity: Goal: Risk for activity intolerance will decrease Outcome:  Progressing

## 2018-08-10 ENCOUNTER — Telehealth: Payer: Self-pay

## 2018-08-10 NOTE — Telephone Encounter (Signed)
Called pt. No answer. Left message for pt to return call.  

## 2019-03-02 ENCOUNTER — Other Ambulatory Visit: Payer: Self-pay

## 2019-03-02 ENCOUNTER — Emergency Department (HOSPITAL_COMMUNITY): Payer: Non-veteran care

## 2019-03-02 ENCOUNTER — Encounter (HOSPITAL_COMMUNITY): Payer: Self-pay

## 2019-03-02 ENCOUNTER — Emergency Department (HOSPITAL_COMMUNITY)
Admission: EM | Admit: 2019-03-02 | Discharge: 2019-03-02 | Disposition: A | Discharge status: Home / Self Care | Payer: Non-veteran care | Attending: Emergency Medicine | Admitting: Emergency Medicine

## 2019-03-02 DIAGNOSIS — I509 Heart failure, unspecified: Secondary | ICD-10-CM | POA: Insufficient documentation

## 2019-03-02 DIAGNOSIS — N185 Chronic kidney disease, stage 5: Secondary | ICD-10-CM | POA: Diagnosis not present

## 2019-03-02 DIAGNOSIS — F17210 Nicotine dependence, cigarettes, uncomplicated: Secondary | ICD-10-CM | POA: Insufficient documentation

## 2019-03-02 DIAGNOSIS — I132 Hypertensive heart and chronic kidney disease with heart failure and with stage 5 chronic kidney disease, or end stage renal disease: Secondary | ICD-10-CM | POA: Insufficient documentation

## 2019-03-02 DIAGNOSIS — Z951 Presence of aortocoronary bypass graft: Secondary | ICD-10-CM | POA: Insufficient documentation

## 2019-03-02 DIAGNOSIS — Z7901 Long term (current) use of anticoagulants: Secondary | ICD-10-CM | POA: Diagnosis not present

## 2019-03-02 DIAGNOSIS — Z79899 Other long term (current) drug therapy: Secondary | ICD-10-CM | POA: Diagnosis not present

## 2019-03-02 DIAGNOSIS — R1909 Other intra-abdominal and pelvic swelling, mass and lump: Secondary | ICD-10-CM | POA: Diagnosis present

## 2019-03-02 DIAGNOSIS — N453 Epididymo-orchitis: Secondary | ICD-10-CM | POA: Insufficient documentation

## 2019-03-02 DIAGNOSIS — J449 Chronic obstructive pulmonary disease, unspecified: Secondary | ICD-10-CM | POA: Insufficient documentation

## 2019-03-02 LAB — URINALYSIS, ROUTINE W REFLEX MICROSCOPIC
Bilirubin Urine: NEGATIVE
Glucose, UA: NEGATIVE mg/dL
Hgb urine dipstick: NEGATIVE
Ketones, ur: NEGATIVE mg/dL
Nitrite: NEGATIVE
Protein, ur: 100 mg/dL — AB
Specific Gravity, Urine: 1.011 (ref 1.005–1.030)
WBC, UA: >50 WBC/hpf — ABNORMAL HIGH (ref 0–5)
pH: 5 (ref 5.0–8.0)

## 2019-03-02 LAB — CBC
HCT: 43.2 % (ref 39.0–52.0)
Hemoglobin: 13.4 g/dL (ref 13.0–17.0)
MCH: 28.6 pg (ref 26.0–34.0)
MCHC: 31 g/dL (ref 30.0–36.0)
MCV: 92.1 fL (ref 80.0–100.0)
Platelets: 236 10*3/uL (ref 150–400)
RBC: 4.69 MIL/uL (ref 4.22–5.81)
RDW: 14.8 % (ref 11.5–15.5)
WBC: 12.8 10*3/uL — ABNORMAL HIGH (ref 4.0–10.5)
nRBC: 0 % (ref 0.0–0.2)

## 2019-03-02 LAB — BASIC METABOLIC PANEL
Anion gap: 11 (ref 5–15)
BUN: 77 mg/dL — ABNORMAL HIGH (ref 8–23)
CO2: 20 mmol/L — ABNORMAL LOW (ref 22–32)
Calcium: 9.4 mg/dL (ref 8.9–10.3)
Chloride: 103 mmol/L (ref 98–111)
Creatinine, Ser: 3.87 mg/dL — ABNORMAL HIGH (ref 0.61–1.24)
GFR calc Af Amer: 17 mL/min — ABNORMAL LOW (ref >60)
GFR calc non Af Amer: 15 mL/min — ABNORMAL LOW (ref >60)
Glucose, Bld: 111 mg/dL — ABNORMAL HIGH (ref 70–99)
Potassium: 4.3 mmol/L (ref 3.5–5.1)
Sodium: 134 mmol/L — ABNORMAL LOW (ref 135–145)

## 2019-03-02 MED ORDER — MORPHINE SULFATE (PF) 4 MG/ML IV SOLN
4.0000 mg | Freq: Once | INTRAVENOUS | Status: AC
  Administered 2019-03-02: 19:00:00 4 mg via INTRAVENOUS
  Filled 2019-03-02: qty 1

## 2019-03-02 MED ORDER — SODIUM CHLORIDE 0.9 % IV SOLN
1.0000 g | Freq: Once | INTRAVENOUS | Status: AC
  Administered 2019-03-02: 1 g via INTRAVENOUS
  Filled 2019-03-02: qty 10

## 2019-03-02 MED ORDER — MORPHINE SULFATE (PF) 4 MG/ML IV SOLN
4.0000 mg | Freq: Once | INTRAVENOUS | Status: AC
  Administered 2019-03-02: 20:00:00 4 mg via INTRAVENOUS
  Filled 2019-03-02: qty 1

## 2019-03-02 MED ORDER — HYDROMORPHONE HCL 1 MG/ML IJ SOLN
1.0000 mg | Freq: Once | INTRAMUSCULAR | Status: AC
  Administered 2019-03-02: 1 mg via INTRAVENOUS
  Filled 2019-03-02: qty 1

## 2019-03-02 MED ORDER — CIPROFLOXACIN HCL 250 MG PO TABS: 250.0000 mg | ORAL_TABLET | ORAL | 0 refills | Status: AC

## 2019-03-02 MED ORDER — HYDROMORPHONE HCL 1 MG/ML IJ SOLN
0.5000 mg | Freq: Once | INTRAMUSCULAR | Status: AC
  Administered 2019-03-02: 0.5 mg via INTRAVENOUS
  Filled 2019-03-02: qty 1

## 2019-03-02 MED ORDER — HYDROCODONE-ACETAMINOPHEN 5-325 MG PO TABS: 1.0000 | ORAL_TABLET | Freq: Four times a day (QID) | ORAL | 0 refills | Status: DC | PRN

## 2019-03-02 NOTE — ED Notes (Signed)
ED Provider at bedside. 

## 2019-03-02 NOTE — ED Triage Notes (Signed)
Pt presents to ED with complaints of right sided testicle pain and swelling x 4 days.

## 2019-03-02 NOTE — ED Notes (Signed)
Ultrasound at bedside

## 2019-03-02 NOTE — Discharge Instructions (Signed)
Take the medications as prescribed.  Follow-up with a urologist or your primary care doctor to make sure the symptoms are improving.

## 2019-03-02 NOTE — ED Provider Notes (Signed)
[facility] EMERGENCY DEPARTMENT Provider Note   CSN: [id number] Arrival date & time: 03/02/19  1652     History   Chief Complaint Chief Complaint  Patient presents with   Groin Swelling    HPI Barry Lawrence is a 73 y.o. male.     HPI Patient presents to the emergency room for evaluation of testicular swelling.  Patient states he started having symptoms a few days ago.  He is feeling pain in his right testicle and it moves up into his groin.  The testicle is very swollen and tender to palpation.  Patient denies any fevers or chills.  No vomiting or diarrhea.  He is tried taking Tylenol but that is not working for his pain anymore.  He has had vascular surgery in the past but not testicular surgery. Past Medical History:  Diagnosis Date   Abdominal aortic aneurysm ([place]) 01/28/10; 02/05/14   4.3x4.6cm;  now measuring 5.5 cm, status post aortobifemoral bypass grafting in January 2016 by Dr. problem along with left renal artery bypass   Arthritis    in his back   CAD (coronary artery disease)    myoview 04/21/11-normal, EF49%   Carotid stenosis 01/15/08   right endarterectomy-Dr. Amedeo Plenty   CKD (chronic kidney disease), stage III    Diabetes ([place])    BORDERLINE   GERD (gastroesophageal reflux disease)    Headache    uses [employer], states he is lessening what he is taking for prep. for surgery    Hyperlipemia    Hypertension    stress test- done 04/2014, followed by Dr. Gwenlyn Found   Myocardial infarction [facility]) 1996   OSA on CPAP    CPAP q night , CPAP is through [place], states doesn't remember when he had the last study   PAD (peripheral artery disease) ([place]) 06/12/09   a. 11/22/07 PTA & stenting right external iliac artery;bilateral iliac & PTI and stenting 1997;right ICA =>50% reduction,right SFA >50%,left ICA at stent => 50% reduction,left EIA => 50% reduction,left ATA occluded, left SFA mid > 49% reduction;  03/2014 Angio: LRA 90, patent L Iliac stent and distal RCIA  stent, large saccular AAA.   Pneumonia    hosp. as a child with pneumonia, not since    Polycythemia    H/O   S/P CABG (coronary artery bypass graft) 1996   Tobacco abuse     Patient Active Problem List   Diagnosis Date Noted   CKD (chronic kidney disease) stage 5, GFR less than 15 ml/min (HCC) 08/07/2018   Pulmonary edema    Systolic congestive heart failure ([place])    Noncardiogenic pulmonary edema 08/06/2018   Bradycardia, drug induced 08/05/2018   SOB (shortness of breath) 08/04/2018   Pleural effusion    Parapneumonic effusion 12/12/2014   Pneumonia due to Klebsiella pneumoniae (HCC)    Acute on chronic diastolic heart failure ([place])    Sepsis ([place]) 11/30/2014   CAP (community acquired pneumonia) 11/29/2014   Chronic diastolic CHF (congestive heart failure) ([place]) 11/29/2014   Cellulitis of left lower extremity 10/06/2014   Solitary pulmonary nodule 09/04/2014   Warfarin anticoagulation    Hypokalemia 06/15/2014   Wound infection 06/15/2014   Encounter for central line placement    HCAP (healthcare-associated pneumonia)    AAA (abdominal aortic aneurysm) ([place]) 05/30/2014   Acute respiratory failure with hypoxia (HCC)    Post-operative state    Respiratory failure ([place])    Thoracic ascending aortic aneurysm ([place]) 03/31/2014   CKD (chronic kidney  disease), stage IV ([place])    Sleep apnea    Cigarette smoker 03/29/2014   COPD GOLD II still smoking  03/26/2014   Abdominal aortic aneurysm without rupture ([place]) 03/18/2014   Hyperlipidemia 02/16/2013   CAD (coronary artery disease) 07/15/2012   GERD (gastroesophageal reflux disease) 07/15/2012   Carotid artery disease    Hypertensive heart disease    PAD (peripheral artery disease) ([place]) 06/12/2009    Past Surgical History:  Procedure Laterality Date   ABDOMINAL AORTAGRAM N/A 04/01/2014   Procedure: ABDOMINAL Maxcine Ham;  Surgeon: [name], MD;  Location[facility] CATH LAB;   Service: Cardiovascular;  Laterality: N/A;   AORTA - BILATERAL FEMORAL ARTERY BYPASS GRAFT N/A 05/30/2014   Procedure: AORTOBIFEMORAL BYPASS GRAFT;  Surgeon: Serafina Mitchell, MD;  Location: [place];  Service: Vascular;  Laterality: N/A;   AORTIC/RENAL BYPASS Left 05/30/2014   Procedure: LEFT RENAL ARTERY BYPASS;  Surgeon: Serafina Mitchell, MD;  Location: [place];  Service: Vascular;  Laterality: Left;   [place]   at [place] Right 01/15/08   COLONOSCOPY N/A 07/09/2013   Procedure: COLONOSCOPY;  Surgeon: Juanita Craver, MD;  Location: [facility] ENDOSCOPY;  Service: Endoscopy;  Laterality: N/A;   CORONARY ARTERY BYPASS GRAFT  1996   LIMA to LAD, sequential vein to OM1 and 2 as well as acure marginal branch and diag branch   EMPYEMA DRAINAGE Right 12/17/2014   Procedure: EMPYEMA DRAINAGE;  Surgeon: Gaye Pollack, MD;  Location: [facility] OR;  Service: Thoracic;  Laterality: Right;   LUMBAR [place]   pv angio  11/22/2007   PTA/ stenting of right common iliac artery with a 10x30mm Smart stent and postdilated with a 7x26mmpowerflex balloon; high grade calcified stenosis of the RICA   PV angio  04/01/14   AAA, Lt renal artery stenosis, patent iliacs   VIDEO ASSISTED THORACOSCOPY (VATS)/DECORTICATION Right 12/17/2014   Procedure: RIGHT VIDEO ASSISTED THORACOSCOPY (VATS);  Surgeon: Gaye Pollack, MD;  Location: [facility] OR;  Service: Thoracic;  Laterality: Right;        Home Medications    Prior to Admission medications   Medication Sig Start Date End Date Taking? Authorizing Provider  albuterol (ACCUNEB) 0.63 MG/3ML nebulizer solution Take 1 ampule by nebulization every 6 (six) hours as needed for wheezing.   Yes [provider]  amLODipine (NORVASC) 2.5 MG tablet Take 1 tablet (2.5 mg total) by mouth daily. 08/09/18  Yes Tat, Shanon Brow, MD  atorvastatin (LIPITOR) 80 MG tablet Take 80 mg by mouth at bedtime.    Yes [provider]  calcitRIOL (ROCALTROL) 0.25  MCG capsule Take 0.25 mcg by mouth daily.   Yes [provider]  finasteride (PROSCAR) 5 MG tablet Take 5 mg by mouth daily.   Yes [provider]  furosemide (LASIX) 80 MG tablet Take 2 tablets (160 mg total) by mouth 2 (two) times daily. 08/08/18  Yes Tat, Shanon Brow, MD  magnesium oxide (MAG-OX) 400 MG tablet Take 400 mg by mouth 2 (two) times daily.   Yes [provider]  omeprazole (PRILOSEC) 20 MG capsule Take 20 mg by mouth at bedtime.    Yes [provider]  tamsulosin (FLOMAX) 0.4 MG CAPS capsule Take 0.4 mg by mouth as directed. Take 2 tablets daily at night   Yes [provider]  warfarin (COUMADIN) 5 MG tablet Take 1 tablet daily except 1/2 tablet on Mondays, Wednesdays and Fridays or as directed Patient taking  differently: 5 mg. Take 1 tablet daily except 1/2 tablet on Mondays, Wednesdays and Fridays or as directed 05/03/16  Yes [name], MD  ciprofloxacin (CIPRO) 250 MG tablet Take 1 tablet (250 mg total) by mouth every 18 (eighteen) hours for 7 doses. 03/02/19 03/07/19  Dorie Rank, MD  HYDROcodone-acetaminophen (NORCO/VICODIN) 5-325 MG tablet Take 1 tablet by mouth every 6 (six) hours as needed for severe pain. 03/02/19   Dorie Rank, MD    Family History Family History  Problem Relation Age of Onset   Heart failure Father     Social History Social History   Tobacco Use   Smoking status: Current Every Day Smoker    Packs/day: 1.00    Years: 56.00    Pack years: 56.00    Types: Cigarettes   Smokeless tobacco: Never Used  Substance Use Topics   Alcohol use: No    Alcohol/week: 0.0 standard drinks   Drug use: No     Allergies   Metolazone and Niaspan [niacin er]   Review of Systems Review of Systems  All other systems reviewed and are negative.    Physical Exam Updated Vital Signs BP (!) 146/68    Pulse (!) 56    Temp 98 F (36.7 C) (Oral)    Resp 16    Ht 1.702 m (5\' 7" )    Wt 80.3 kg    SpO2 98%    BMI  27.72 kg/m   Physical Exam Vitals signs and nursing note reviewed.  Constitutional:      General: He is not in acute distress.    Appearance: He is well-developed.  HENT:     Head: Normocephalic and atraumatic.     Right Ear: External ear normal.     Left Ear: External ear normal.  Eyes:     General: No scleral icterus.       Right eye: No discharge.        Left eye: No discharge.     Conjunctiva/sclera: Conjunctivae normal.  Neck:     Musculoskeletal: Neck supple.     Trachea: No tracheal deviation.  Cardiovascular:     Rate and Rhythm: Normal rate and regular rhythm.     Comments: Normal femoral pulses bilaterally Pulmonary:     Effort: Pulmonary effort is normal. No respiratory distress.     Breath sounds: Normal breath sounds. No stridor. No wheezing or rales.  Abdominal:     General: Bowel sounds are normal. There is no distension.     Palpations: Abdomen is soft.     Tenderness: There is no abdominal tenderness. There is no guarding or rebound.     Hernia: There is no hernia in the left inguinal area or right inguinal area.  Genitourinary:    Pubic Area: No rash.      Penis: Normal.      Scrotum/Testes:        Right: Tenderness and swelling present.        Left: Tenderness or swelling not present.     Epididymis:     Right: Tenderness present.     Left: No tenderness.     Comments: No pulsatile mass noted in the inguinal region, Musculoskeletal:        General: No tenderness.  Lymphadenopathy:     Lower Body: No right inguinal adenopathy. No left inguinal adenopathy.  Skin:    General: Skin is warm and dry.     Findings: No rash.  Neurological:  Mental Status: He is alert.     Cranial Nerves: No cranial nerve deficit (no facial droop, extraocular movements intact, no slurred speech).     Sensory: No sensory deficit.     Motor: No abnormal muscle tone or seizure activity.     Coordination: Coordination normal.      ED Treatments / Results  Labs (all  labs ordered are listed, but only abnormal results are displayed) Labs Reviewed  CBC - Abnormal; Notable for the following components:      Result Value   WBC 12.8 (*)    All other components within normal limits  BASIC METABOLIC PANEL - Abnormal; Notable for the following components:   Sodium 134 (*)    CO2 20 (*)    Glucose, Bld 111 (*)    BUN 77 (*)    Creatinine, Ser 3.87 (*)    GFR calc non Af Amer 15 (*)    GFR calc Af Amer 17 (*)    All other components within normal limits  URINALYSIS, ROUTINE W REFLEX MICROSCOPIC - Abnormal; Notable for the following components:   APPearance HAZY (*)    Protein, ur 100 (*)    Leukocytes,Ua LARGE (*)    WBC, UA >50 (*)    Bacteria, UA RARE (*)    All other components within normal limits  URINE CULTURE    EKG None  Radiology US Scrotum W/doppler  Result Date: 03/02/2019 CLINICAL DATA:  Right-sided testicular swelling. EXAM: SCROTAL ULTRASOUND DOPPLER ULTRASOUND OF THE TESTICLES TECHNIQUE: Complete ultrasound examination of the testicles, epididymis, and other scrotal structures was performed. Color and spectral Doppler ultrasound were also utilized to evaluate blood flow to the testicles. COMPARISON:  CT abdomen pelvis dated June 19, 2014. FINDINGS: Right testicle Measurements: 4.2 x 2.9 x 2.9 cm. Mildly increased vascularity. No mass or microlithiasis visualized. Left testicle Measurements: 3.9 x 2.1 x 3.1 cm. No mass or microlithiasis visualized. Right epididymis:  Enlarged and hyperemic.  6 mm cyst. Left epididymis:  Normal in size and appearance.  4 mm cyst. Hydrocele:  Small complex right hydrocele.  Trace left hydrocele. Varicocele:  None visualized. Pulsed Doppler interrogation of both testes demonstrates normal low resistance arterial and venous waveforms bilaterally. IMPRESSION: 1. Right epididymo-orchitis. Electronically Signed   By: Titus Dubin M.D.   On: 03/02/2019 21:38    Procedures Procedures (including critical care  time)  Medications Ordered in ED Medications  morphine 4 MG/ML injection 4 mg (4 mg Intravenous Given 03/02/19 1839)  morphine 4 MG/ML injection 4 mg (4 mg Intravenous Given 03/02/19 1958)  HYDROmorphone (DILAUDID) injection 1 mg (1 mg Intravenous Given 03/02/19 2015)  cefTRIAXone (ROCEPHIN) 1 g in sodium chloride 0.9 % 100 mL IVPB (0 g Intravenous Stopped 03/02/19 2143)  HYDROmorphone (DILAUDID) injection 0.5 mg (0.5 mg Intravenous Given 03/02/19 2235)     Initial Impression / Assessment and Plan / ED Course  I have reviewed the triage vital signs and the nursing notes.  Pertinent labs & imaging results that were available during my care of the patient were reviewed by me and considered in my medical decision making (see chart for details).  Clinical Course as of Mar 01 2241  Fri Mar 02, 2019  2047 Labs notable for elevated white blood cell count.  Patient has chronic kidney disease.   [HT]  3428 Ultrasound shows epididymitis/orchitis without evidence of torsion or mass   [JK]    Clinical Course User Index [JK] Dorie Rank, MD  Patient presented to ED with complaints of right testicular pain.  Labs are consistent with a urinary tract infection.  Ultrasound is consistent with his physical exam findings of an epididymitis/orchitis.  Patient was given IV narcotics for pain medications.  He was given a dose of Rocephin.  Plan on discharge home with Cipro at renal adjusted dosing.  He will need to have his Coumadin monitored while taking this medication.  Final Clinical Impressions(s) / ED Diagnoses   Final diagnoses:  Epididymo-orchitis    ED Discharge Orders         Ordered    ciprofloxacin (CIPRO) 250 MG tablet  Every 18 hours     03/02/19 2241    HYDROcodone-acetaminophen (NORCO/VICODIN) 5-325 MG tablet  Every 6 hours PRN     03/02/19 2241           Dorie Rank, MD 03/02/19 2243

## 2019-03-02 NOTE — ED Notes (Signed)
Right Arm Restricted Extremity. Armband placed on wrist.

## 2019-03-05 LAB — URINE CULTURE: Culture: 70000 — AB

## 2019-03-06 ENCOUNTER — Telehealth: Payer: Self-pay | Admitting: *Deleted

## 2019-03-06 NOTE — Telephone Encounter (Signed)
Post ED Visit - Positive Culture Follow-up  Culture report reviewed by antimicrobial stewardship pharmacist: [place] Team []  [address] Pharm.D. []  Heide Guile, Pharm.D., BCPS AQ-ID []  Parks Neptune, Pharm.D., BCPS []  Alycia Rossetti, Pharm.D., BCPS [x]  [place], [place].D., BCPS, AAHIVP []  Legrand Como, Pharm.D., BCPS, AAHIVP []  Salome Arnt, PharmD, BCPS []  Johnnette Gourd, PharmD, BCPS []  Hughes Better, PharmD, BCPS []  Leeroy Cha, PharmD []  Laqueta Linden, PharmD, BCPS []  Albertina Parr, PharmD  [place] Team []  Leodis Sias, PharmD []  Lindell Spar, PharmD []  Royetta Asal, PharmD []  Graylin Shiver, Rph []  Rema Fendt) Glennon Mac, PharmD []  Arlyn Dunning, PharmD []  Netta Cedars, PharmD []  Dia Sitter, PharmD []  Leone Haven, PharmD []  Gretta Arab, PharmD []  Theodis Shove, PharmD []  Peggyann Juba, PharmD []  Reuel Boom, PharmD   Positive urine culture Treated with Ciprofloxacin, organism sensitive to the same and no further patient follow-up is required at this time.  Harlon Flor [facility] 03/06/2019, 11:34 AM

## 2019-03-28 DIAGNOSIS — I129 Hypertensive chronic kidney disease with stage 1 through stage 4 chronic kidney disease, or unspecified chronic kidney disease: Secondary | ICD-10-CM | POA: Diagnosis not present

## 2019-03-28 DIAGNOSIS — N189 Chronic kidney disease, unspecified: Secondary | ICD-10-CM | POA: Diagnosis not present

## 2019-03-28 DIAGNOSIS — R809 Proteinuria, unspecified: Secondary | ICD-10-CM | POA: Diagnosis not present

## 2019-03-28 DIAGNOSIS — I770 Arteriovenous fistula, acquired: Secondary | ICD-10-CM | POA: Diagnosis not present

## 2019-03-28 DIAGNOSIS — E211 Secondary hyperparathyroidism, not elsewhere classified: Secondary | ICD-10-CM | POA: Diagnosis not present

## 2019-03-28 DIAGNOSIS — D631 Anemia in chronic kidney disease: Secondary | ICD-10-CM | POA: Diagnosis not present

## 2019-03-28 DIAGNOSIS — E871 Hypo-osmolality and hyponatremia: Secondary | ICD-10-CM | POA: Diagnosis not present

## 2019-03-28 DIAGNOSIS — N184 Chronic kidney disease, stage 4 (severe): Secondary | ICD-10-CM | POA: Diagnosis not present

## 2019-03-30 ENCOUNTER — Other Ambulatory Visit: Payer: Self-pay

## 2019-03-30 ENCOUNTER — Encounter (HOSPITAL_COMMUNITY): Payer: Self-pay

## 2019-03-30 ENCOUNTER — Encounter (HOSPITAL_COMMUNITY)
Admission: RE | Admit: 2019-03-30 | Discharge: 2019-03-30 | Disposition: A | Discharge status: Home / Self Care | Payer: No Typology Code available for payment source | Source: Ambulatory Visit | Attending: Nephrology | Admitting: Nephrology

## 2019-03-30 DIAGNOSIS — N185 Chronic kidney disease, stage 5: Secondary | ICD-10-CM | POA: Insufficient documentation

## 2019-03-30 DIAGNOSIS — D509 Iron deficiency anemia, unspecified: Secondary | ICD-10-CM | POA: Diagnosis not present

## 2019-03-30 MED ORDER — SODIUM CHLORIDE 0.9 % IV SOLN
Freq: Once | INTRAVENOUS | Status: AC
  Administered 2019-03-30: 15:00:00 via INTRAVENOUS

## 2019-03-30 MED ORDER — SODIUM CHLORIDE 0.9 % IV SOLN
510.0000 mg | Freq: Once | INTRAVENOUS | Status: AC
  Administered 2019-03-30: 510 mg via INTRAVENOUS
  Filled 2019-03-30: qty 510

## 2019-04-02 ENCOUNTER — Other Ambulatory Visit (HOSPITAL_COMMUNITY): Payer: Self-pay | Admitting: Family Medicine

## 2019-04-02 ENCOUNTER — Other Ambulatory Visit: Payer: Self-pay | Admitting: Family Medicine

## 2019-04-02 DIAGNOSIS — Z87891 Personal history of nicotine dependence: Secondary | ICD-10-CM

## 2019-04-06 ENCOUNTER — Other Ambulatory Visit: Payer: Self-pay

## 2019-04-06 ENCOUNTER — Encounter (HOSPITAL_COMMUNITY)
Admission: RE | Admit: 2019-04-06 | Discharge: 2019-04-06 | Disposition: A | Discharge status: Home / Self Care | Payer: No Typology Code available for payment source | Source: Ambulatory Visit | Attending: Nephrology | Admitting: Nephrology

## 2019-04-06 DIAGNOSIS — N185 Chronic kidney disease, stage 5: Secondary | ICD-10-CM | POA: Diagnosis not present

## 2019-04-06 MED ORDER — SODIUM CHLORIDE 0.9 % IV SOLN
510.0000 mg | Freq: Once | INTRAVENOUS | Status: AC
  Administered 2019-04-06: 510 mg via INTRAVENOUS
  Filled 2019-04-06: qty 510

## 2019-04-06 MED ORDER — SODIUM CHLORIDE 0.9 % IV SOLN
Freq: Once | INTRAVENOUS | Status: AC
  Administered 2019-04-06: 15:00:00 via INTRAVENOUS

## 2019-04-18 ENCOUNTER — Other Ambulatory Visit: Payer: Self-pay

## 2019-04-18 ENCOUNTER — Ambulatory Visit (HOSPITAL_COMMUNITY)
Admission: RE | Admit: 2019-04-18 | Discharge: 2019-04-18 | Disposition: A | Discharge status: Home / Self Care | Payer: No Typology Code available for payment source | Source: Ambulatory Visit | Attending: Family Medicine | Admitting: Family Medicine

## 2019-04-18 DIAGNOSIS — Z87891 Personal history of nicotine dependence: Secondary | ICD-10-CM

## 2019-05-09 ENCOUNTER — Institutional Professional Consult (permissible substitution): Payer: No Typology Code available for payment source | Admitting: Internal Medicine

## 2019-05-28 ENCOUNTER — Encounter: Payer: Self-pay | Admitting: Internal Medicine

## 2019-05-28 ENCOUNTER — Other Ambulatory Visit: Payer: Self-pay

## 2019-05-28 ENCOUNTER — Ambulatory Visit (INDEPENDENT_AMBULATORY_CARE_PROVIDER_SITE_OTHER): Payer: No Typology Code available for payment source | Admitting: Internal Medicine

## 2019-05-28 DIAGNOSIS — J449 Chronic obstructive pulmonary disease, unspecified: Secondary | ICD-10-CM

## 2019-05-28 DIAGNOSIS — R911 Solitary pulmonary nodule: Secondary | ICD-10-CM

## 2019-05-28 DIAGNOSIS — F17210 Nicotine dependence, cigarettes, uncomplicated: Secondary | ICD-10-CM

## 2019-05-28 NOTE — Patient Instructions (Addendum)
Try zyrtec 10 mg in evening (if it makes you sleepy, if not ok to take in am) to see if helps the drippy nose.  The key is to stop smoking completely before smoking completely stops you!  Strongly recommend you take the covid19 vaccine if offered    Please schedule a follow up visit in 3 months but call sooner if needed with pfts   here

## 2019-05-28 NOTE — Progress Notes (Signed)
Subjective:    Patient ID: Barry Lawrence, male    DOB: 1945/10/16  MRN: [id number]  HPI  74 yowm active smoker followed by [facility] for OSA being considered for AAA surgery and sent for preop eval by Dr [name] to pulmonary clinic 03/26/2014    03/26/2014 1st [facility] [facility] office visit/ [name]   Chief Complaint  Patient presents with  . Pulmonary Consult    Referred by Dr. [name] for pulmonary clearance for AAA surgery.  Pt c/o SOB "over a period of time". He states that his legs usually give out before his breathing bothers him.    not on any resp rx at this point  Sleeps ok flat on cpap  with min am cough production thick and white and no tendency to acute excacerbations rec  You have very little copd and unlikely you ever will if you stop smoking now (should stop for at least 2 weeks preop anyway) The main problem with surgery will be the need to lie flat and the effect of your weight on your breathing so early mobilization and minimum sedating meds will be key   Admit date: 10/06/2014 Discharge date: 10/08/2014   Recommendations for Outpatient Follow-up:  1. Follow up with nephrologist at [facility] as scheduled. Lab work scheduled for 10/09/14. Review of meds given worsening renal failure. 2. Follow up with PCP 1-2 weeks for evaluation of cellulitis.  3. Follow up with INR check and dosing of coumadin 10/09/14  Discharge Diagnoses:  Active Problems:  CAD (coronary artery disease)  Hypertensive heart disease  Hyperlipidemia  COPD GOLD II still smoking   Cigarette smoker  CKD (chronic kidney disease), stage III  PAD (peripheral artery disease)  Atrial fibrillation  Acute on chronic renal failure  Long-term (current) use of anticoagulants  Cellulitis of left lower extremity  Cellulitis     10/25/2014 f/u ov/[name] re: GOLD II copd/ still smoking  Chief Complaint  Patient presents with  . Follow-up    Pt states breathing is no better since last visit here in Nov 2015.  He states that he gets SOB with just talking and walking short distances.  He has been having swelling in his legs for the past couple wks.   Sleeps ok on cpap per va - really helps noct sob/ wheeze  Swelling in legs getting worse during day  Neb helped breathing perioperatively  Doe x > nl pace or inclines  = MMRC 1  rec Try spiriva respimat 2 puffs each am x 2 weeks and if you like it fill it As long as you stop smoking now, you should be able to preserve adequate lung function as your COPD is mild When fluid builds up in your legs it is likely in your lungs and cause you to wheeze and look like you have copd flare but you probably don't > see you nephrologist We need to change your coreg to alternative called bisoprol because coreg can cause wheezing especially in higher doses. Take prilosec Take 30-60 min before first meal of the day     05/28/2019  Consultation / re-establish ov/[name] re: spn x 1.3 cm /copd prev GOLD II but still smoking  Chief Complaint  Patient presents with  . Consult    COPD, Nodule, SOB, cough, wheezing, mucus is white  Dyspnea:  Only time he leaves home  is to see doctor, no longer doing grocery shopping, uses [employer] parking = MMRC3 = can't walk 100 yards even at a slow pace  at a flat grade s stopping due to sob   Cough: mucoid  Prod x sev tps/day esp p stirs in am assoc with pnds watery, worse with odors / on ppi bid ac  Sleeping: no respiratory symptoms  SABA use: once a month neb 02: none  Just started hd x nov 2020  To see [facility] 1/11/ re 4.2 cm AAA    No obvious day to day or daytime variability or assoc excess/ purulent sputum or mucus plugs or hemoptysis or cp or chest tightness,   or overt sinus or hb symptoms.   sleeping without nocturnal  or early am exacerbation  of respiratory  c/o's or need for noct saba. Also denies any obvious fluctuation of symptoms with weather or environmental changes or other aggravating or alleviating factors except as outlined  above   No unusual exposure hx or h/o childhood pna/ asthma or knowledge of premature birth.  Current Allergies, Complete Past Medical History, Past Surgical History, Family History, and Social History were reviewed in [employer] record.  ROS  The following are not active complaints unless bolded Hoarseness, sore throat, dysphagia, dental problems, itching, sneezing,  nasal congestion or discharge of excess mucus or purulent secretions, ear ache,   fever, chills, sweats, unintended wt loss or wt gain, classically pleuritic or exertional cp,  orthopnea pnd or arm/hand swelling  or leg swelling better since started HD , presyncope, palpitations, abdominal pain, anorexia, nausea, vomiting, diarrhea  or change in bowel habits or change in bladder habits, change in stools or change in urine, dysuria, hematuria,  rash, arthralgias, visual complaints, headache, numbness, weakness or ataxia or problems with walking or coordination,  change in mood or  memory.        Current Meds  Medication Sig  . albuterol (ACCUNEB) 0.63 MG/3ML nebulizer solution Take 1 ampule by nebulization every 6 (six) hours as needed for wheezing.  [name] amLODipine (NORVASC) 2.5 MG tablet Take 1 tablet (2.5 mg total) by mouth daily.  [name] atorvastatin (LIPITOR) 80 MG tablet Take 80 mg by mouth at bedtime.   . bisoprolol (ZEBETA) 5 MG tablet Take 5 mg by mouth daily.  . finasteride (PROSCAR) 5 MG tablet Take 5 mg by mouth daily.  . furosemide (LASIX) 80 MG tablet Take 2 tablets (160 mg total) by mouth 2 (two) times daily.  . pantoprazole (PROTONIX) 20 MG tablet Take 20 mg by mouth 2 (two) times daily.  . tamsulosin (FLOMAX) 0.4 MG CAPS capsule Take 0.4 mg by mouth as directed. Take 2 tablets daily at night  . warfarin (COUMADIN) 5 MG tablet Take 1 tablet daily except 1/2 tablet on Mondays, Wednesdays and Fridays or as directed (Patient taking differently: 5 mg. Take 1 tablet daily except 1/2 tablet on Mondays,  Wednesdays and Fridays or as directed)  . [DISCONTINUED] calcitRIOL (ROCALTROL) 0.25 MCG capsule Take 0.25 mcg by mouth daily.  . [DISCONTINUED] HYDROcodone-acetaminophen (NORCO/VICODIN) 5-325 MG tablet Take 1 tablet by mouth every 6 (six) hours as needed for severe pain.  . [DISCONTINUED] magnesium oxide (MAG-OX) 400 MG tablet Take 400 mg by mouth 2 (two) times daily.  . [DISCONTINUED] omeprazole (PRILOSEC) 20 MG capsule Take 20 mg by mouth at bedtime.                     Objective:   Physical Exam  amb wm nad   Vital signs reviewed - Note on arrival 02 sats  97% on RA  05/28/2019         175  10/25/2014         188     03/26/14 201 lb (91.173 kg)  03/18/14 197 lb 8 oz (89.585 kg)  02/19/14 196 lb (88.905 kg)     Elderly amb wm > stated age   44 : pt wearing mask not removed for exam due to covid -19 concerns.    NECK :  without JVD/Nodes/TM/ nl carotid upstrokes bilaterally   LUNGS: no acc muscle use,  Mod barrel  contour chest wall with bilateral  Distant bs s audible wheeze and  without cough on insp or exp maneuvers and mod  Hyperresonant  to  percussion bilaterally     CV:  RRR  no s3 or murmur or increase in P2, and no edema   ABD:  Large ventral hernia/  nontender with pos mid insp Hoover's  in the supine position. No bruits or organomegaly appreciated, bowel sounds nl  MS:     ext warm without deformities, calf tenderness, cyanosis or clubbing No obvious joint restrictions   SKIN: warm and dry without lesions    NEURO:  alert, approp, nl sensorium with  no motor or cerebellar deficits apparent.          I personally reviewed images and agree with radiology impression as follows:   Chest LDSCT 04/18/2019 1. Lung-RADS 4X, highly suspicious. Additional imaging evaluation or consultation with Pulmonology or Thoracic Surgery recommended. A left apical pulmonary nodule of volume derived equivalent diameter 13.2 mm has enlarged when compared to prior CTs,  including 09/06/2016.         Assessment & Plan:

## 2019-05-29 ENCOUNTER — Encounter: Payer: Self-pay | Admitting: Internal Medicine

## 2019-05-29 NOTE — Assessment & Plan Note (Signed)
Active smoker CT chest 03/05/14 6 mm pulmonary nodule at the left lung apex.   - repeat 09/04/2014 > no change   - repeat 09/04/15 > no change > final CT 09/06/2016 . Ongoing stability of bilateral pulmonary nodules, most consistent with benign etiologies. -  Chest LDSCT 04/18/2019 1. Lung-RADS 4X, highly suspicious.  A left apical pulmonary nodule of volume derived equivalent diameter 13.2 mm has enlarged when compared to prior CTs, including 09/06/2016.   If this proves to be malignant, it is very slow growing based on prior CT's and this pt has many very serious co-morbidities and poor candidate for aggressive therapies esp in pandemic until he gets vaccinated at least.  He is not inclined to accept additonal risk of studies or interventions at this point and is more concerned about his hernias from previous surgeries and wants to talk about these with vascular surgery as a priority so we will see him back here in 3 months for pfts and consider PET/navigational fob and offering RT then if proves to be malignant.  Discussed in detail all the  indications, usual  risks and alternatives  relative to the benefits with patient who agrees to proceed with conservative f/u as outlined

## 2019-05-29 NOTE — Assessment & Plan Note (Signed)
Counseled re importance of smoking cessation but did not meet time criteria for separate billing     Total time devoted to reviewing /counseling/charting =   60 min office visit:  review case with pt/ discussion of options/alternatives/ personally creating written customized instructions  in presence of pt  then going over those specific  Instructions directly with the pt including how to use all of the meds but in particular covering each new medication in detail and the difference between the maintenance= "automatic" meds and the prns using an action plan format for the latter (If this problem/symptom => do that organization reading Left to right).  Please see AVS from this visit for a full list of these instructions which I personally wrote for this pt and  are unique to this visit.

## 2019-05-29 NOTE — Assessment & Plan Note (Signed)
Active smoker 03/26/2014 spirometry FEV1  1.51 (51%) ratio 69 off all rx  - 10/25/2014 p extensive coaching respimat effectiveness = 90% try spiriva 2 pffs each am      Most likely he's progressed since prior eval and is a very poor candidate for surgical intervention but could consider navigational bx/ localized RT if this proves to be cancer (if so it's very slow growing so no urgency)   rec Stop smoking if possible Try zyrtec for his main complaint of pnds Return for full pfts as soon as can schedule given covid restrictions

## 2019-06-01 ENCOUNTER — Telehealth (HOSPITAL_COMMUNITY): Payer: Self-pay

## 2019-06-01 NOTE — Telephone Encounter (Signed)

## 2019-06-04 ENCOUNTER — Other Ambulatory Visit: Payer: Self-pay

## 2019-06-04 ENCOUNTER — Ambulatory Visit (INDEPENDENT_AMBULATORY_CARE_PROVIDER_SITE_OTHER): Payer: Medicare Other | Admitting: Surgery

## 2019-06-04 ENCOUNTER — Encounter: Payer: Self-pay | Admitting: Surgery

## 2019-06-04 VITALS — BP 144/71 | HR 55 | Temp 98.0°F | Resp 18 | Ht 67.0 in | Wt 173.0 lb

## 2019-06-04 DIAGNOSIS — I714 Abdominal aortic aneurysm, without rupture, unspecified: Secondary | ICD-10-CM

## 2019-06-04 NOTE — Progress Notes (Signed)
[facility]  Patient name: Barry Lawrence MRN: [id number] DOB: 14-Dec-1945 Sex: male   REQUESTING PROVIDER:    Howls Aros    REASON FOR CONSULT:    AAA  HISTORY OF PRESENT ILLNESS:   Barry Lawrence is a 74 y.o. male, who is status post aortobifemoral bypass with 16x8 graft and left renal artery bypass with 29mm graft. This was done for claudication and an 5.5 cmAAA as well as renal artery stenosis. He was not a stent graft candidate secondary to previously placed iliac stents which were too small to advance a graft through. He had a prolonged hospital course complicated by respiratory failure requiring intubation for approximately 2 weeks. He also suffered acute renal failure which required dialysis for several days. His renal function recovered to where his creatinine returned to near baseline around 2.    The patient has a history of coronary artery disease.  He is status post CABG in 1996.  He is also status post right carotid endarterectomy by Dr. Amedeo Plenty in 2009.  He has undergone iliac stenting bilaterally.   The patient suffers from hypercholesterolemia which is managed with a statin.  He is on multiple medications for hypertension.  He continues to smoke approximately 2 packs a day.  His shortness of breath has been fairly stable.  He was referred here for evaluation of a 5.2 cm aneurysm.  This is actually in the a sending aorta.  He started dialysis approximately 2 months ago.  He has been feeling dizzy and lightheaded at times.  The symptoms have been going on for 3 years.  His last carotid duplex was in 2016 which was unremarkable. PAST MEDICAL HISTORY    Past Medical History:  Diagnosis Date  . Abdominal aortic aneurysm ([place]) 01/28/10; 02/05/14   4.3x4.6cm;  now measuring 5.5 cm, status post aortobifemoral bypass grafting in January 2016 by Dr. problem along with left renal artery bypass  . Arthritis    in  his back  . CAD (coronary artery disease)    myoview 04/21/11-normal, EF49%  . Carotid stenosis 01/15/08   right endarterectomy-Dr. Amedeo Plenty  . CKD (chronic kidney disease), stage III   . Diabetes ([place])    BORDERLINE  . GERD (gastroesophageal reflux disease)   . Headache    uses [employer], states he is lessening what he is taking for prep. for surgery   . Hyperlipemia   . Hypertension    stress test- done 04/2014, followed by Dr. Gwenlyn Found  . Myocardial infarction ([place]) 1996  . OSA on CPAP    CPAP q night , CPAP is through [place], states doesn't remember when he had the last study  . PAD (peripheral artery disease) ([place]) 06/12/09   a. 11/22/07 PTA & stenting right external iliac artery;bilateral iliac & PTI and stenting 1997;right ICA =>50% reduction,right SFA >50%,left ICA at stent => 50% reduction,left EIA => 50% reduction,left ATA occluded, left SFA mid > 49% reduction;  03/2014 Angio: LRA 90, patent L Iliac stent and distal RCIA stent, large saccular AAA.  [name] Pneumonia    hosp. as a child with pneumonia, not since   . Polycythemia    H/O  . S/P CABG (coronary artery bypass graft) 1996  . Tobacco abuse      FAMILY HISTORY   Family History  Problem Relation Age of Onset  . Heart failure Father     SOCIAL HISTORY:   Social History   Socioeconomic History  . Marital  status: Married    Spouse name: Not on file  . Number of children: 2  . Years of education: Not on file  . Highest education level: Not on file  Occupational History  . Occupation: Retired Estate manager/land agent  Tobacco Use  . Smoking status: Current Every Day Smoker    Packs/day: 1.00    Years: 56.00    Pack years: 56.00    Types: Cigarettes  . Smokeless tobacco: Never Used  Substance and Sexual Activity  . Alcohol use: No    Alcohol/week: 0.0 standard drinks  . Drug use: No  . Sexual activity: Not on file  Other Topics Concern  . Not on file  Social History Narrative   Lives with wife.   Social Determinants  of Health   Financial Resource Strain:   . Difficulty of Paying Living Expenses: Not on file  Food Insecurity:   . Worried About Charity fundraiser in the Last Year: Not on file  . Ran Out of Food in the Last Year: Not on file  Transportation Needs:   . Lack of Transportation (Medical): Not on file  . Lack of Transportation (Non-Medical): Not on file  Physical Activity:   . Days of Exercise per Week: Not on file  . Minutes of Exercise per Session: Not on file  Stress:   . Feeling of Stress : Not on file  Social Connections:   . Frequency of Communication with Friends and Family: Not on file  . Frequency of Social Gatherings with Friends and Family: Not on file  . Attends Religious Services: Not on file  . Active Member of Clubs or Organizations: Not on file  . Attends Archivist Meetings: Not on file  . Marital Status: Not on file  Intimate Partner Violence:   . Fear of Current or Ex-Partner: Not on file  . Emotionally Abused: Not on file  . Physically Abused: Not on file  . Sexually Abused: Not on file    ALLERGIES:    Allergies  Allergen Reactions  . Carvedilol Other (See Comments)  . Metolazone     Pt says had MI when took Pt says had MI when took Pt says had MI when took  . Niacin Hives  . Niaspan [Niacin Er] Hives    CURRENT MEDICATIONS:    Current Outpatient Medications  Medication Sig Dispense Refill  . albuterol (ACCUNEB) 0.63 MG/3ML nebulizer solution Take 1 ampule by nebulization every 6 (six) hours as needed for wheezing.    [name] amLODipine (NORVASC) 2.5 MG tablet Take 1 tablet (2.5 mg total) by mouth daily. 30 tablet 1  . atorvastatin (LIPITOR) 80 MG tablet Take 80 mg by mouth at bedtime.     . bisoprolol (ZEBETA) 5 MG tablet Take 5 mg by mouth daily.    . finasteride (PROSCAR) 5 MG tablet Take 5 mg by mouth daily.    . furosemide (LASIX) 80 MG tablet Take 2 tablets (160 mg total) by mouth 2 (two) times daily. 120 tablet 0  . pantoprazole  (PROTONIX) 20 MG tablet Take 20 mg by mouth 2 (two) times daily.    . tamsulosin (FLOMAX) 0.4 MG CAPS capsule Take 0.4 mg by mouth as directed. Take 2 tablets daily at night    . warfarin (COUMADIN) 5 MG tablet Take 1 tablet daily except 1/2 tablet on Mondays, Wednesdays and Fridays or as directed (Patient taking differently: 5 mg. Take 1 tablet daily except 1/2 tablet on Mondays, Wednesdays and  Fridays or as directed) 30 tablet 4   No current facility-administered medications for this visit.    REVIEW OF SYSTEMS:   [X]  denotes positive finding, [ ]  denotes negative finding Cardiac  Comments:  Chest pain or chest pressure:    Shortness of breath upon exertion: x   Short of breath when lying flat:    Irregular heart rhythm: x       Vascular    Pain in calf, thigh, or hip brought on by ambulation: x   Pain in feet at night that wakes you up from your sleep:  x   Blood clot in your veins:    Leg swelling:  x       Pulmonary    Oxygen at home:    Productive cough:  x   Wheezing:  x       Neurologic    Sudden weakness in arms or legs:     Sudden numbness in arms or legs:     Sudden onset of difficulty speaking or slurred speech:    Temporary loss of vision in one eye:     Problems with dizziness:  x       Gastrointestinal    Blood in stool:      Vomited blood:         Genitourinary    Burning when urinating:     Blood in urine:        Psychiatric    Major depression:         Hematologic    Bleeding problems:    Problems with blood clotting too easily:        Skin    Rashes or ulcers:        Constitutional    Fever or chills:     PHYSICAL EXAM:   Vitals:   06/04/19 1030  BP: (!) 144/71  Pulse: (!) 55  Resp: 18  Temp: 98 F (36.7 C)  SpO2: 99%  Weight: 173 lb (78.5 kg)  Height: 5\' 7"  (1.702 m)    GENERAL: The patient is a well-nourished male, in no acute distress. The vital signs are documented above. CARDIAC: There is a regular rate and rhythm.    VASCULAR: Palpable femoral pulses bilaterally.  I could not palpate pedal pulses PULMONARY: Nonlabored respirations ABDOMEN: Soft and non-tender.  Large nonreducible incisional hernia MUSCULOSKELETAL: There are no major deformities or cyanosis. NEUROLOGIC: No focal weakness or paresthesias are detected. SKIN: There are no ulcers or rashes noted. PSYCHIATRIC: The patient has a normal affect.  STUDIES:   I have reviewed his CT scan which shows a 4.2 cm ascending aortic aneurysm  ASSESSMENT and PLAN   I discussed with the patient, his a sending aortic aneurysm needs to be evaluated by cardiac surgery.  I will have Dr. Nathanial Rancher make his referral.  It has been several years since I have evaluated his open aneurysm repair.  I would like to have this done with ultrasound to make sure he does not have aneurysmal degeneration is at the proximal and distal attachment site.  I will arrange this in the next several months.  Leia Alf, MD, FACS [facility] [phone] Pager [phone]

## 2019-06-05 ENCOUNTER — Other Ambulatory Visit: Payer: Self-pay | Admitting: *Deleted

## 2019-06-05 DIAGNOSIS — I714 Abdominal aortic aneurysm, without rupture, unspecified: Secondary | ICD-10-CM

## 2019-06-13 ENCOUNTER — Encounter: Payer: Self-pay | Admitting: Cardiology

## 2019-06-13 ENCOUNTER — Telehealth: Payer: Self-pay | Admitting: Cardiology

## 2019-06-13 ENCOUNTER — Telehealth (INDEPENDENT_AMBULATORY_CARE_PROVIDER_SITE_OTHER): Payer: No Typology Code available for payment source | Admitting: Cardiology

## 2019-06-13 VITALS — BP 147/74 | HR 61 | Ht 68.0 in | Wt 173.0 lb

## 2019-06-13 DIAGNOSIS — I4891 Unspecified atrial fibrillation: Secondary | ICD-10-CM

## 2019-06-13 DIAGNOSIS — I10 Essential (primary) hypertension: Secondary | ICD-10-CM

## 2019-06-13 DIAGNOSIS — I2510 Atherosclerotic heart disease of native coronary artery without angina pectoris: Secondary | ICD-10-CM

## 2019-06-13 DIAGNOSIS — I6523 Occlusion and stenosis of bilateral carotid arteries: Secondary | ICD-10-CM

## 2019-06-13 DIAGNOSIS — E782 Mixed hyperlipidemia: Secondary | ICD-10-CM

## 2019-06-13 DIAGNOSIS — I739 Peripheral vascular disease, unspecified: Secondary | ICD-10-CM

## 2019-06-13 NOTE — Telephone Encounter (Signed)

## 2019-06-13 NOTE — Patient Instructions (Signed)

## 2019-06-13 NOTE — Progress Notes (Signed)
Virtual Visit via Telephone Note   This visit type was conducted due to national recommendations for restrictions regarding the COVID-19 Pandemic (e.g. social distancing) in an effort to limit this patient's exposure and mitigate transmission in our community.  Due to his co-morbid illnesses, this patient is at least at moderate risk for complications without adequate follow up.  This format is felt to be most appropriate for this patient at this time.  The patient did not have access to video technology/had technical difficulties with video requiring transitioning to audio format only (telephone).  All issues noted in this document were discussed and addressed.  No physical exam could be performed with this format.  Please refer to the patient's chart for his  consent to telehealth for [facility].   Date:  06/13/2019   ID:  Barry Lawrence, DOB 1945-11-28, MRN [id number]  Patient Location: Home Provider Location: Home  PCP:  Blane Ohara, MD  Cardiologist:  Carlyle Dolly, MD  Electrophysiologist:  None   Evaluation Performed:  Follow-Up Visit  Chief Complaint:  Follow up visit  History of Present Illness:    Barry Lawrence is a 74 y.o. male seen today for follow up of the following medical problems.   1. CAD - history of CABG in 1996 (LIMA-LAD, SVG OM1 and OM2 sequential)   - no recent chest pain. Chronic SOB unchanged   2. HTN - high K, off eplerenone. Had been on aldactone previously, had some gynecomastia.    - compliant with meds.   3. PAD - history of bilateral iliac and bilateral SFA stents.  - history of aortobifemoral bypass Jan 2016 and left renal bypass  - followed by vascular   3. Carotid stenosis - history of right CEA in 2009 - no recent symptoms.    4. AAA - history of repair  5. Renal artery stenosis - prior angiogram showed 90% left renal artery stenosis - history of left renal bypass at time of aortobifem in Jan  2016   6. PAF - no recent palpitaitons - no bleeding on coumadin  7. Hyperlipidemia - [facility] TC 126 TG 220 HDL 27 LDL 55 - [facility]   8. ESRD - on HD x 3 months now   9. COPD  10. Chronic diastolic HF - fluid managed with HD   11. Ascending aortic aneurysm - CT chest 03/2019 showed 4.2 cm aneurysm - from notes he is to referred to CT surgery  The patient does not have symptoms concerning for COVID-19 infection (fever, chills, cough, or new shortness of breath).    Past Medical History:  Diagnosis Date  . Abdominal aortic aneurysm ([place]) 01/28/10; 02/05/14   4.3x4.6cm;  now measuring 5.5 cm, status post aortobifemoral bypass grafting in January 2016 by Dr. problem along with left renal artery bypass  . Arthritis    in his back  . CAD (coronary artery disease)    myoview 04/21/11-normal, EF49%  . Carotid stenosis 01/15/08   right endarterectomy-Dr. Amedeo Plenty  . CKD (chronic kidney disease), stage III   . Diabetes ([place])    BORDERLINE  . GERD (gastroesophageal reflux disease)   . Headache    uses [employer], states he is lessening what he is taking for prep. for surgery   . Hyperlipemia   . Hypertension    stress test- done 04/2014, followed by Dr. Gwenlyn Found  . Myocardial infarction ([place]) 1996  . OSA on CPAP  CPAP q night , CPAP is through [place], states doesn't remember when he had the last study  . PAD (peripheral artery disease) ([place]) 06/12/09   a. 11/22/07 PTA & stenting right external iliac artery;bilateral iliac & PTI and stenting 1997;right ICA =>50% reduction,right SFA >50%,left ICA at stent => 50% reduction,left EIA => 50% reduction,left ATA occluded, left SFA mid > 49% reduction;  03/2014 Angio: LRA 90, patent L Iliac stent and distal RCIA stent, large saccular AAA.  [name] Pneumonia    hosp. as a child with pneumonia, not since   . Polycythemia    H/O  . S/P CABG (coronary artery bypass graft) 1996  . Tobacco abuse    Past Surgical  History:  Procedure Laterality Date  . ABDOMINAL AORTAGRAM N/A 04/01/2014   Procedure: ABDOMINAL Maxcine Ham;  Surgeon: [name], MD;  Location: [facility] CATH LAB;  Service: Cardiovascular;  Laterality: N/A;  . AORTA - BILATERAL FEMORAL ARTERY BYPASS GRAFT N/A 05/30/2014   Procedure: AORTOBIFEMORAL BYPASS GRAFT;  Surgeon: Serafina Mitchell, MD;  Location: [facility];  Service: Vascular;  Laterality: N/A;  . AORTIC/RENAL BYPASS Left 05/30/2014   Procedure: LEFT RENAL ARTERY BYPASS;  Surgeon: Serafina Mitchell, MD;  Location: [facility];  Service: Vascular;  Laterality: Left;  . BACK SURGERY  1988   at [facility]  . CAROTID ENDARTERECTOMY Right 01/15/08  . COLONOSCOPY N/A 07/09/2013   Procedure: COLONOSCOPY;  Surgeon: Juanita Craver, MD;  Location: [facility] ENDOSCOPY;  Service: Endoscopy;  Laterality: N/A;  . CORONARY ARTERY BYPASS GRAFT  1996   LIMA to LAD, sequential vein to OM1 and 2 as well as acure marginal Breane Grunwald and diag Eilee Schader  . EMPYEMA DRAINAGE Right 12/17/2014   Procedure: EMPYEMA DRAINAGE;  Surgeon: Gaye Pollack, MD;  Location: [facility];  Service: Thoracic;  Laterality: Right;  . [place] SURGERY  1998  . pv angio  11/22/2007   PTA/ stenting of right common iliac artery with a 10x57mm Smart stent and postdilated with a 7x7mmpowerflex balloon; high grade calcified stenosis of the RICA  . PV angio  04/01/14   AAA, Lt renal artery stenosis, patent iliacs  . VIDEO ASSISTED THORACOSCOPY (VATS)/DECORTICATION Right 12/17/2014   Procedure: RIGHT VIDEO ASSISTED THORACOSCOPY (VATS);  Surgeon: Gaye Pollack, MD;  Location[facility];  Service: Thoracic;  Laterality: Right;     Current Meds  Medication Sig  . albuterol (ACCUNEB) 0.63 MG/3ML nebulizer solution Take 1 ampule by nebulization every 6 (six) hours as needed for wheezing.  [name] atorvastatin (LIPITOR) 80 MG tablet Take 80 mg by mouth at bedtime.   . bisoprolol (ZEBETA) 5 MG tablet Take 5 mg by mouth daily.  . finasteride (PROSCAR) 5 MG tablet Take 5 mg by mouth daily.   . furosemide (LASIX) 80 MG tablet Take 2 tablets (160 mg total) by mouth 2 (two) times daily.  . pantoprazole (PROTONIX) 20 MG tablet Take 20 mg by mouth 2 (two) times daily.  . tamsulosin (FLOMAX) 0.4 MG CAPS capsule Take 0.4 mg by mouth as directed. Take 2 tablets daily at night  . warfarin (COUMADIN) 5 MG tablet Take 1 tablet daily except 1/2 tablet on Mondays, Wednesdays and Fridays or as directed (Patient taking differently: 5 mg. Take 1 tablet daily except 1/2 tablet on Mondays, Wednesdays and Fridays or as directed)  . [DISCONTINUED] amLODipine (NORVASC) 2.5 MG tablet Take 1 tablet (2.5 mg total) by mouth daily.     Allergies:   Carvedilol, Metolazone, Niacin, and Niaspan [niacin er]  Social History   Tobacco Use  . Smoking status: Current Every Day Smoker    Packs/day: 1.00    Years: 56.00    Pack years: 56.00    Types: Cigarettes  . Smokeless tobacco: Never Used  Substance Use Topics  . Alcohol use: No    Alcohol/week: 0.0 standard drinks  . Drug use: No     Family Hx: The patient's family history includes Heart failure in his father.  ROS:   Please see the history of present illness.     All other systems reviewed and are negative.   Prior CV studies:   The following studies were reviewed today:  04/2016 [facility]: normal LV function, severe diastolic dysfunction, mod to severe pulm HTN,   [facility] Normal [facility] function, moderate diastolic dysfunction, mild AI, mod to severe pulm HTN  Labs/Other Tests and Data Reviewed:    EKG:  No ECG reviewed.  Recent Labs: 08/04/2018: ALT 12; B Natriuretic Peptide 815.0 03/02/2019: BUN 77; Creatinine, Ser 3.87; Hemoglobin 13.4; Platelets 236; Potassium 4.3; Sodium 134   Recent Lipid Panel Lab Results  Component Value Date/Time   CHOL 143 12/13/2014 04:50 PM   TRIG 130 10/07/2014 06:02 AM   HDL 22 (L) 10/07/2014 06:02 AM   CHOLHDL 4.4 10/07/2014 06:02 AM   LDLCALC 49 10/07/2014 06:02 AM    Wt Readings  from Last 3 Encounters:  06/13/19 173 lb (78.5 kg)  06/04/19 173 lb (78.5 kg)  05/28/19 175 lb 3.2 oz (79.5 kg)     Objective:    Vital Signs:  BP (!) 147/74   Pulse 61   Ht 5\' 8"  (1.727 m)   Wt 173 lb (78.5 kg)   BMI 26.30 kg/m    Normal affect. Normal speech pattern and tone. Comfortable, no apparent distress.   ASSESSMENT & PLAN:    1. CAD - no symptoms, continue current meds  2. HTN - continue current meds, some low bp's at times during HD per his report, defer to neprhology if needs to adjust meds.  3. PAD -continue to follow with vascular   4. Carotid stenosis - no symptoms, continue meidcla therapy  5. PAF -no recent symptoms, conitnue beta blocker and anticoagulation  6. Hyperlipidemia - continue statin, request labs from pcp  7. ESRD - HD per nephrology  8. Chronic diastolic HF - volume manged by HD, no recent symptoms  9. Ascending aortic aneurysnm - mild by recent imaging at 4.2 cm, conitnue to monitor  COVID-19 Education: The signs and symptoms of COVID-19 were discussed with the patient and how to seek care for testing (follow up with PCP or arrange E-visit).  The importance of social distancing was discussed today.  Time:   Today, I have spent 24 minutes with the patient with telehealth technology discussing the above problems.     Medication Adjustments/Labs and Tests Ordered: Current medicines are reviewed at length with the patient today.  Concerns regarding medicines are outlined above.   Tests Ordered: No orders of the defined types were placed in this encounter.   Medication Changes: No orders of the defined types were placed in this encounter.   Follow Up:  Either In Person or Virtual in 6 month(s)  Signed, Carlyle Dolly, MD  06/13/2019 8:34 AM    [place]

## 2019-08-03 ENCOUNTER — Telehealth (HOSPITAL_COMMUNITY): Payer: Self-pay

## 2019-08-03 ENCOUNTER — Telehealth: Payer: Self-pay | Admitting: Adult Health

## 2019-08-03 NOTE — Telephone Encounter (Signed)

## 2019-08-03 NOTE — Telephone Encounter (Signed)
Yes there is no evidence of a call. Will close encounter.

## 2019-08-06 ENCOUNTER — Other Ambulatory Visit: Payer: Self-pay

## 2019-08-06 ENCOUNTER — Ambulatory Visit (HOSPITAL_COMMUNITY)
Admission: RE | Admit: 2019-08-06 | Discharge: 2019-08-06 | Disposition: A | Discharge status: Home / Self Care | Payer: No Typology Code available for payment source | Source: Ambulatory Visit | Attending: Surgery | Admitting: Surgery

## 2019-08-06 ENCOUNTER — Encounter: Payer: Self-pay | Admitting: Surgery

## 2019-08-06 ENCOUNTER — Ambulatory Visit (INDEPENDENT_AMBULATORY_CARE_PROVIDER_SITE_OTHER): Payer: No Typology Code available for payment source | Admitting: Surgery

## 2019-08-06 VITALS — BP 154/77 | HR 55 | Temp 97.7°F | Resp 20 | Ht 68.0 in | Wt 179.0 lb

## 2019-08-06 DIAGNOSIS — I714 Abdominal aortic aneurysm, without rupture, unspecified: Secondary | ICD-10-CM

## 2019-08-06 DIAGNOSIS — I6523 Occlusion and stenosis of bilateral carotid arteries: Secondary | ICD-10-CM

## 2019-08-06 NOTE — Progress Notes (Signed)
[facility] [place]  Patient name: Barry Lawrence MRN: [id number] DOB: 08-25-1945 Sex: male   REASON FOR VISIT:    Follow up  HISOTRY OF PRESENT ILLNESS:    Barry Lawrence is a 74 y.o. male, who is status post aortobifemoral bypass with 16x8 graft and left renal artery bypass with 35mm graft. This was done for claudication and an 5.5 cmAAA as well as renal artery stenosis. He was not a stent graft candidate secondary to previously placed iliac stents which were too small to advance a graft through. He had a prolonged hospital course complicated by respiratory failure requiring intubation for approximately 2 weeks. He also suffered acute renal failure which required dialysis for several days. His renal function recovered to where his creatinine returned to near baseline around 2.    The patient has a history of coronary artery disease. He is status post CABG in 1996. He is also status post right carotid endarterectomy by Dr. Amedeo Plenty in 2009. He has undergone iliac stenting bilaterally.   The patient suffers from hypercholesterolemia which is managed with a statin. He is on multiple medications for hypertension. He continues to smoke approximately 2 packs a day. His shortness of breath has been fairly stable.  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  He is now on dialysis   PAST MEDICAL HISTORY:   Past Medical History:  Diagnosis Date  . Abdominal aortic aneurysm ([place]) 01/28/10; 02/05/14   4.3x4.6cm;  now measuring 5.5 cm, status post aortobifemoral bypass grafting in January 2016 by Dr. problem along with left renal artery bypass  . Arthritis    in his back  . CAD (coronary artery disease)    myoview 04/21/11-normal, EF49%  . Carotid stenosis 01/15/08   right endarterectomy-Dr. Amedeo Plenty  . CKD (chronic kidney disease), stage III   . Diabetes ([place])    BORDERLINE  . GERD (gastroesophageal reflux disease)   . Headache    uses [employer], states he is lessening what he is taking for prep. for surgery   . Hyperlipemia   . Hypertension    stress test- done 04/2014, followed by Dr. Gwenlyn Found  . Myocardial infarction ([place]) 1996  . OSA on CPAP    CPAP q night , CPAP is through [place], states doesn't remember when he had the last study  . PAD (peripheral artery disease) ([place]) 06/12/09   a. 11/22/07 PTA & stenting right external iliac artery;bilateral iliac & PTI and stenting 1997;right ICA =>50% reduction,right SFA >50%,left ICA at stent => 50% reduction,left EIA => 50% reduction,left ATA occluded, left SFA mid > 49% reduction;  03/2014 Angio: LRA 90, patent L Iliac stent and distal RCIA stent, large saccular AAA.  [name] Pneumonia    hosp. as a child with pneumonia, not since   . Polycythemia    H/O  . S/P CABG (coronary artery bypass graft) 1996  . Tobacco abuse      FAMILY HISTORY:   Family History  Problem Relation Age of Onset  . Heart failure Father     SOCIAL HISTORY:   Social History   Tobacco Use  . Smoking status: Current Every Day Smoker    Packs/day: 1.00    Years: 56.00    Pack years: 56.00    Types: Cigarettes  . Smokeless tobacco: Never Used  Substance Use Topics  . Alcohol use: No    Alcohol/week: 0.0 standard drinks     ALLERGIES:   Allergies  Allergen Reactions  .  Carvedilol Other (See Comments)  . Metolazone     Pt says had MI when took Pt says had MI when took Pt says had MI when took  . Niacin Hives  . Niaspan [Niacin Er] Hives     CURRENT MEDICATIONS:   Current Outpatient Medications  Medication Sig Dispense Refill  . albuterol (ACCUNEB) 0.63 MG/3ML nebulizer solution Take 1 ampule by nebulization every 6 (six) hours as needed for wheezing.    [name] amLODipine (NORVASC) 5 MG tablet Take 5 mg by mouth daily. Pt does not take on days he goes to dialysis. (Tues. Thurs, & Sat)    . atorvastatin (LIPITOR) 80 MG tablet Take 80 mg by mouth at bedtime.     . bisoprolol (ZEBETA) 5  MG tablet Take 5 mg by mouth daily. Pt does not take on dialysis days.(TUES, THURS & SAT)    . famotidine (PEPCID) 20 MG tablet Take by mouth.    . Ferric Citrate (AURYXIA PO) Take by mouth.    . finasteride (PROSCAR) 5 MG tablet Take 5 mg by mouth daily.    . furosemide (LASIX) 80 MG tablet Take 2 tablets (160 mg total) by mouth 2 (two) times daily. (Patient taking differently: Take 160 mg by mouth 2 (two) times daily. pT DOES NOT TAKE ON DIALYSIS DAYS. (TUES, THURS & SAT)) 120 tablet 0  . tamsulosin (FLOMAX) 0.4 MG CAPS capsule Take 0.4 mg by mouth as directed. Take 2 tablets daily at night    . warfarin (COUMADIN) 5 MG tablet Take 1 tablet daily except 1/2 tablet on Mondays, Wednesdays and Fridays or as directed (Patient taking differently: 5 mg. Take 1 tablet daily except 1/2 tablet on Mondays, Wednesdays and Fridays or as directed) 30 tablet 4   No current facility-administered medications for this visit.    REVIEW OF SYSTEMS:   [X]  denotes positive finding, [ ]  denotes negative finding Cardiac  Comments:  Chest pain or chest pressure:    Shortness of breath upon exertion: x   Short of breath when lying flat: x   Irregular heart rhythm: x       Vascular    Pain in calf, thigh, or hip brought on by ambulation: x   Pain in feet at night that wakes you up from your sleep:  x   Blood clot in your veins:    Leg swelling:  x       Pulmonary    Oxygen at home:    Productive cough:  x   Wheezing:  x       Neurologic    Sudden weakness in arms or legs:     Sudden numbness in arms or legs:     Sudden onset of difficulty speaking or slurred speech:    Temporary loss of vision in one eye:     Problems with dizziness:  x       Gastrointestinal    Blood in stool:     Vomited blood:         Genitourinary    Burning when urinating:     Blood in urine:        Psychiatric    Major depression:         Hematologic    Bleeding problems:    Problems with blood clotting too easily:         Skin    Rashes or ulcers:        Constitutional    Fever or chills:  PHYSICAL EXAM:   Vitals:   08/06/19 0940  BP: (!) 154/77  Pulse: (!) 55  Resp: 20  Temp: 97.7 F (36.5 C)  SpO2: 99%  Weight: 179 lb (81.2 kg)  Height: 5\' 8"  (1.727 m)    GENERAL: The patient is a well-nourished male, in no acute distress. The vital signs are documented above. CARDIAC: There is a regular rate and rhythm.  VASCULAR: Palpable right posterior tibial pulse.  I cannot palpate pulses in the left foot however he has triphasic Doppler signals.  Tortuous but widely patent right brachiocephalic fistula PULMONARY: Non-labored respirations ABDOMEN: Soft and non-tender with normal pitched bowel sounds.  MUSCULOSKELETAL: There are no major deformities or cyanosis. NEUROLOGIC: No focal weakness or paresthesias are detected. SKIN: There are no ulcers or rashes noted. PSYCHIATRIC: The patient has a normal affect.  STUDIES:   I have ordered and reviewed his vascular studies with the following results:  No evidence of aneurysm is identified.  Specifically no changes noted at the anastomotic sites.  MEDICAL ISSUES:   The patient will be brought back in 1 year for repeat abdominal ultrasound, looking for pseudoaneurysmal changes at his distal and proximal graft anastomoses.  The patient has a 4.2 cm ascending aortic aneurysm.  He will need to see cardiothoracic surgery at some point in time.  I will let his primary care physician make this referral.    Leia Alf, MD, FACS [facility] [phone] Pager [phone]

## 2019-08-07 ENCOUNTER — Other Ambulatory Visit: Payer: Self-pay | Admitting: *Deleted

## 2019-08-07 DIAGNOSIS — I714 Abdominal aortic aneurysm, without rupture, unspecified: Secondary | ICD-10-CM

## 2019-08-07 DIAGNOSIS — N184 Chronic kidney disease, stage 4 (severe): Secondary | ICD-10-CM

## 2019-08-23 ENCOUNTER — Other Ambulatory Visit: Payer: Self-pay

## 2019-08-23 ENCOUNTER — Other Ambulatory Visit (HOSPITAL_COMMUNITY)
Admission: RE | Admit: 2019-08-23 | Discharge: 2019-08-23 | Disposition: A | Discharge status: Home / Self Care | Payer: Medicare Other | Source: Ambulatory Visit | Attending: Internal Medicine | Admitting: Internal Medicine

## 2019-08-27 ENCOUNTER — Ambulatory Visit: Payer: No Typology Code available for payment source | Admitting: Internal Medicine

## 2019-08-28 ENCOUNTER — Ambulatory Visit: Payer: No Typology Code available for payment source | Admitting: Internal Medicine

## 2019-08-28 ENCOUNTER — Ambulatory Visit: Payer: No Typology Code available for payment source | Admitting: Adult Health

## 2019-10-01 ENCOUNTER — Other Ambulatory Visit (HOSPITAL_COMMUNITY)
Admission: RE | Admit: 2019-10-01 | Discharge: 2019-10-01 | Disposition: A | Discharge status: Home / Self Care | Payer: Medicare Other | Source: Ambulatory Visit | Attending: Internal Medicine | Admitting: Internal Medicine

## 2019-10-01 ENCOUNTER — Other Ambulatory Visit: Payer: Self-pay

## 2019-10-01 DIAGNOSIS — Z01812 Encounter for preprocedural laboratory examination: Secondary | ICD-10-CM | POA: Insufficient documentation

## 2019-10-01 DIAGNOSIS — Z20822 Contact with and (suspected) exposure to covid-19: Secondary | ICD-10-CM | POA: Insufficient documentation

## 2019-10-01 LAB — SARS CORONAVIRUS 2 (TAT 6-24 HRS): SARS Coronavirus 2: NEGATIVE

## 2019-10-05 ENCOUNTER — Other Ambulatory Visit: Payer: Self-pay

## 2019-10-05 ENCOUNTER — Ambulatory Visit: Payer: No Typology Code available for payment source | Admitting: Internal Medicine

## 2019-10-05 ENCOUNTER — Ambulatory Visit (INDEPENDENT_AMBULATORY_CARE_PROVIDER_SITE_OTHER): Payer: No Typology Code available for payment source | Admitting: Primary Care

## 2019-10-05 ENCOUNTER — Encounter: Payer: Self-pay | Admitting: Primary Care

## 2019-10-05 ENCOUNTER — Ambulatory Visit (INDEPENDENT_AMBULATORY_CARE_PROVIDER_SITE_OTHER): Payer: No Typology Code available for payment source | Admitting: Internal Medicine

## 2019-10-05 VITALS — BP 130/78 | HR 85 | Temp 98.1°F | Ht 67.0 in | Wt 175.0 lb

## 2019-10-05 DIAGNOSIS — R911 Solitary pulmonary nodule: Secondary | ICD-10-CM

## 2019-10-05 DIAGNOSIS — J449 Chronic obstructive pulmonary disease, unspecified: Secondary | ICD-10-CM

## 2019-10-05 LAB — PULMONARY FUNCTION TEST
DL/VA % pred: 124 %
DL/VA: 5.05 ml/min/mmHg/L
DLCO cor % pred: 87 %
DLCO cor: 19.97 ml/min/mmHg
DLCO unc % pred: 87 %
DLCO unc: 19.97 ml/min/mmHg
FEF 25-75 Post: 0.7 L/sec
FEF 25-75 Pre: 0.6 L/sec
FEF2575-%Change-Post: 17 %
FEF2575-%Pred-Post: 34 %
FEF2575-%Pred-Pre: 29 %
FEV1-%Change-Post: 9 %
FEV1-%Pred-Post: 54 %
FEV1-%Pred-Pre: 49 %
FEV1-Post: 1.46 L
FEV1-Pre: 1.34 L
FEV1FVC-%Change-Post: 3 %
FEV1FVC-%Pred-Pre: 78 %
FEV6-%Change-Post: 6 %
FEV6-%Pred-Post: 69 %
FEV6-%Pred-Pre: 65 %
FEV6-Post: 2.43 L
FEV6-Pre: 2.3 L
FEV6FVC-%Change-Post: 0 %
FEV6FVC-%Pred-Post: 106 %
FEV6FVC-%Pred-Pre: 105 %
FVC-%Change-Post: 5 %
FVC-%Pred-Post: 66 %
FVC-%Pred-Pre: 62 %
FVC-Post: 2.47 L
FVC-Pre: 2.34 L
Post FEV1/FVC ratio: 59 %
Post FEV6/FVC ratio: 99 %
Pre FEV1/FVC ratio: 57 %
Pre FEV6/FVC Ratio: 98 %
RV % pred: 106 %
RV: 2.46 L
TLC % pred: 76 %
TLC: 4.83 L

## 2019-10-05 MED ORDER — SPIRIVA RESPIMAT 2.5 MCG/ACT IN AERS: 2.0000 | INHALATION_SPRAY | Freq: Every day | RESPIRATORY_TRACT | 0 refills | Status: DC

## 2019-10-05 NOTE — Patient Instructions (Addendum)
Recommendations: Trial Spiriva respimat - take two puffs daily in the morning (if you find this beneficial please let our office know and we will send in prescription)  Orders: PET scan re: solitary pulmonary nodule > 1cm  Follow-up: 3 months with Dr. Melvyn Novas    PET Scan A PET scan (positron emission tomography) is a test that creates pictures of the inside of your body. For the test, a small amount of radioactive material is injected into a vein. A special scanner then takes pictures of your body. The pictures created during a PET scan can be used to study diseases, like cancer. The colors and brightness on the pictures show different levels of organ and tissue function. For example, cancer tissue appears brighter than normal tissue on a PET scan image. Tell a health care provider about:  Any allergies you have.  All medicines you are taking, including vitamins, herbs, eye drops, creams, and over-the-counter medicines.  Any blood disorders you have.  Any surgeries you have had.  Any medical conditions you have.  If you are afraid of cramped spaces (claustrophobic). If claustrophobia is a problem, it usually can be relieved with a medicine to help you relax (sedative) or a medicine to treat anxiety.  If you have trouble staying still for long periods of time.  Whether you are pregnant or may be pregnant. What are the risks? Generally, this is a safe test. However, problems may occur, including:  Bleeding, pain, or swelling at the injection site.  Allergic reactions to the radioactive material. This is rare. What happens before the procedure?  Do not eat or drink anything after midnight on the night before the procedure, or as directed by your health care provider.  Take medicines only as directed by your health care provider.  Tell your health care provider if you are pregnant or breastfeeding.  If you have diabetes, ask your health care provider for diet guidelines to  control your blood sugar (glucose) levels on the day of the test. What happens during the procedure?  An IV will be inserted into one of your veins.  A small amount of radioactive material will be injected into a vein.  You will wait 30-60 minutes after the injection. This allows the material to travel through your body.  You will lie on a cushioned table, and the table will be moved through the center of an imaging machine similar to a CT scanner.  Pictures of your body will be taken. It will take about 30-60 minutes for the machine to produce the pictures. You will need to stay very still during this time. What happens after the procedure?   Ask your health care provider, or the department that is doing the test: ? When will my results be ready? ? How will I get my results? ? What are my treatment options? ? What other tests do I need? ? What are my next steps?  You may resume your normal diet and activities.  Drink 6-8 glasses of water after the test to flush the radioactive material out of your body. Drink enough fluid to keep your urine pale yellow. Summary  A PET scan is a test that creates pictures of the inside of the body. PET stands for positron emission tomography.  For this test, a small dose of a harmless radioactive material is injected into a vein. It will travel through your body in 30-60 minutes.  While lying down and staying very still, you will be moved through  a machine that takes pictures of your body. This will take 30-60 minutes.  The colors and brightness on the pictures show different levels of organ and tissue function. For example, cancer tissue appears brighter than normal tissue on a PET scan image. This information is not intended to replace advice given to you by your health care provider. Make sure you discuss any questions you have with your health care provider. Document Revised: 06/01/2017 Document Reviewed: 06/01/2017 Elsevier Patient Education   2020 [employer].

## 2019-10-05 NOTE — Assessment & Plan Note (Signed)
-   LDCT 04/18/19 Lung RADS 4X. Discussed that pulmonary nodule is suspicious for lung cancer, appears to be slow growing but recommending PET scan and possible biopsy. Patient is agreeing to have imaging done at this time. He would not be interested in aggressive surgical treatment options.

## 2019-10-05 NOTE — Assessment & Plan Note (Signed)
-   PFTs 10/05/2019 FEV1 1.46 (54%), ratio 59 - Re- try Spiriva respimat 2.34mcg two puffs once daily in the morning

## 2019-10-05 NOTE — Progress Notes (Signed)
Full PFT performed today. °

## 2019-10-05 NOTE — Progress Notes (Signed)
@Patient  ID: Barry Lawrence, male    DOB: [birth year], 74 y.o.   MRN: [id number]  Chief Complaint  Patient presents with  . Follow-up    pt is here did PFT today.pt is having sobwhen doing activities and fatigue.    Referring provider: Blane Ohara, MD  HPI: 74 year old male, current everyday smoker.  Past medical history significant for COPD Gold 2, sleep apnea, solitary pulmonary nodule, GERD, acute respiratory failure with hypoxia, community-acquired pneumonia, abdominal aortic aneurysm, diastolic heart failure, coronary artery disease, hypertensive heart disease.  Patient of Dr. Melvyn Novas, last seen January 2021.    Low-dose CT 04/18/2019 scan showed lung RADS S4X, highly suspicious.  Left apical pulmonary nodule 13.2 mm has enlarged compared to prior CTs including 09/06/2016.  If nodule proves to be malignant is very slow-growing based on prior CT. patient has many very serious comorbidities and poor candidate for aggressive therapies.  Patient not inclined to accept additional risks of studies or interventions at this point.  Plan follow-up in 3 months with PFTs and consider PET/navigational flexible bronchoscopy.  10/05/2019 Patient presents today for regular follow-up with PFTs. He gets out of breath walking up stairs. He has some chest congestion which he thinks is related to his sinuses. He is not really bothered by this. States that he has so many health issues it's hard to know what is causing his problems. He has afib and is on dialysis. States that his heart rate can go very low. His bisoprolol has been discontinued. He is not overly concerned about the lung nodule that was seen on his LDCT. He is more concerned about his glaucoma and aortic aneurysm.  He is still smoking, states that he has tried to quit but he cant. Discussed that pulmonary nodule is suspicious for lung cancer, appears to be slow growing but recommending PET scan and possible biopsy. Patient is agreeing to have imaging  done at this time. He would not be interested in aggressive surgical treatment options.   Pulmonary testing: 03/26/2014 spirometry FEV1  1.51 (51%) ratio 69 off all rx   10/05/2019-FVC 2.47 (66%), FEV1 1.46 (54%), ratio 59, TLC 76%, DLCO cor 19.97 (87%) Borderline bronchodilator response  Allergies  Allergen Reactions  . Carvedilol Other (See Comments)  . Metolazone     Pt says had MI when took Pt says had MI when took Pt says had MI when took  . Niacin Hives  . Niaspan [Niacin Er] Hives    Immunization History  Administered Date(s) Administered  . Influenza Split 03/20/2014  . Influenza, High Dose Seasonal PF 01/23/2019  . Pneumococcal Conjugate-13 06/10/2010, 02/13/2013  . Pneumococcal-Unspecified 02/21/2013    Past Medical History:  Diagnosis Date  . Abdominal aortic aneurysm ([place]) 01/28/10; 02/05/14   4.3x4.6cm;  now measuring 5.5 cm, status post aortobifemoral bypass grafting in January 2016 by Dr. problem along with left renal artery bypass  . Arthritis    in his back  . CAD (coronary artery disease)    myoview 04/21/11-normal, EF49%  . Carotid stenosis 01/15/08   right endarterectomy-Dr. Amedeo Plenty  . CKD (chronic kidney disease), stage III   . Diabetes ([place])    BORDERLINE  . GERD (gastroesophageal reflux disease)   . Headache    uses [employer], states he is lessening what he is taking for prep. for surgery   . Hyperlipemia   . Hypertension    stress test- done 04/2014, followed by Dr. Gwenlyn Found  . Myocardial infarction ([place]) 1996  .  OSA on CPAP    CPAP q night , CPAP is through [place], states doesn't remember when he had the last study  . PAD (peripheral artery disease) ([place]) 06/12/09   a. 11/22/07 PTA & stenting right external iliac artery;bilateral iliac & PTI and stenting 1997;right ICA =>50% reduction,right SFA >50%,left ICA at stent => 50% reduction,left EIA => 50% reduction,left ATA occluded, left SFA mid > 49% reduction;  03/2014 Angio: LRA 90, patent L Iliac stent and  distal RCIA stent, large saccular AAA.  [name] Pneumonia    hosp. as a child with pneumonia, not since   . Polycythemia    H/O  . S/P CABG (coronary artery bypass graft) 1996  . Tobacco abuse     Tobacco History: Social History   Tobacco Use  Smoking Status Current Every Day Smoker  . Packs/day: 1.00  . Years: 56.00  . Pack years: 56.00  . Types: Cigarettes  Smokeless Tobacco Never Used   Ready to quit: Not Answered Counseling given: Not Answered   Outpatient Medications Prior to Visit  Medication Sig Dispense Refill  . albuterol (ACCUNEB) 0.63 MG/3ML nebulizer solution Take 1 ampule by nebulization every 6 (six) hours as needed for wheezing.    [name] amLODipine (NORVASC) 5 MG tablet Take 5 mg by mouth daily. Pt does not take on days he goes to dialysis. (Tues. Thurs, & Sat)    . atorvastatin (LIPITOR) 80 MG tablet Take 80 mg by mouth at bedtime.     . famotidine (PEPCID) 20 MG tablet Take by mouth.    . Ferric Citrate (AURYXIA PO) Take by mouth.    . finasteride (PROSCAR) 5 MG tablet Take 5 mg by mouth daily.    . furosemide (LASIX) 80 MG tablet Take 2 tablets (160 mg total) by mouth 2 (two) times daily. (Patient taking differently: Take 160 mg by mouth 2 (two) times daily. pT DOES NOT TAKE ON DIALYSIS DAYS. (TUES, THURS & SAT)) 120 tablet 0  . tamsulosin (FLOMAX) 0.4 MG CAPS capsule Take 0.4 mg by mouth as directed. Take 2 tablets daily at night    . warfarin (COUMADIN) 5 MG tablet Take 1 tablet daily except 1/2 tablet on Mondays, Wednesdays and Fridays or as directed (Patient taking differently: 5 mg. Take 1 tablet daily except 1/2 tablet on Mondays, Wednesdays and Fridays or as directed) 30 tablet 4  . bisoprolol (ZEBETA) 5 MG tablet Take 5 mg by mouth daily. Pt does not take on dialysis days.(TUES, THURS & SAT)     No facility-administered medications prior to visit.    Review of Systems  Review of Systems  Eyes: Positive for visual disturbance.  Respiratory: Positive for  shortness of breath.   Cardiovascular:       Irregular heart beat    Physical Exam  BP 130/78 (BP Location: Left Arm, Cuff Size: Normal)   Pulse 85   Temp 98.1 F (36.7 C) (Temporal)   Ht 5\' 7"  (1.702 m)   Wt 175 lb (79.4 kg)   SpO2 98%   BMI 27.41 kg/m  Physical Exam Constitutional:      Appearance: Normal appearance.  HENT:     Head: Normocephalic and atraumatic.     Mouth/Throat:     Mouth: Mucous membranes are moist.     Pharynx: Oropharynx is clear.  Cardiovascular:     Rate and Rhythm: Normal rate. Rhythm irregular.  Pulmonary:     Breath sounds: Rhonchi present.  Skin:    General: Skin  is warm and dry.  Neurological:     General: No focal deficit present.     Mental Status: He is alert and oriented to person, place, and time. Mental status is at baseline.  Psychiatric:        Mood and Affect: Mood normal.        Behavior: Behavior normal.        Thought Content: Thought content normal.        Judgment: Judgment normal.      Lab Results:  CBC    Component Value Date/Time   WBC 12.8 (H) 03/02/2019 1840   RBC 4.69 03/02/2019 1840   HGB 13.4 03/02/2019 1840   HCT 43.2 03/02/2019 1840   PLT 236 03/02/2019 1840   MCV 92.1 03/02/2019 1840   MCH 28.6 03/02/2019 1840   MCHC 31.0 03/02/2019 1840   RDW 14.8 03/02/2019 1840   LYMPHSABS 1.5 08/04/2018 1948   MONOABS 0.9 08/04/2018 1948   EOSABS 0.1 08/04/2018 1948   BASOSABS 0.1 08/04/2018 1948    BMET    Component Value Date/Time   NA 134 (L) 03/02/2019 1840   K 4.3 03/02/2019 1840   CL 103 03/02/2019 1840   CO2 20 (L) 03/02/2019 1840   GLUCOSE 111 (H) 03/02/2019 1840   BUN 77 (H) 03/02/2019 1840   CREATININE 3.87 (H) 03/02/2019 1840   CREATININE 2.02 (H) 05/03/2014 0829   CALCIUM 9.4 03/02/2019 1840   GFRNONAA 15 (L) 03/02/2019 1840   GFRAA 17 (L) 03/02/2019 1840    BNP    Component Value Date/Time   BNP 815.0 (H) 08/04/2018 1948    ProBNP No results found for: PROBNP  Imaging: No  results found.   Assessment & Plan:   Solitary pulmonary nodule - LDCT 04/18/19 Lung RADS 4X. Discussed that pulmonary nodule is suspicious for lung cancer, appears to be slow growing but recommending PET scan and possible biopsy. Patient is agreeing to have imaging done at this time. He would not be interested in aggressive surgical treatment options.   COPD GOLD II still smoking  - PFTs 10/05/2019 FEV1 1.46 (54%), ratio 59 - Re- try Spiriva respimat 2.12mcg two puffs once daily in the morning   FU in 3 months with Dr. Alethia Berthold, NP 10/05/2019

## 2019-10-12 ENCOUNTER — Telehealth: Payer: Self-pay | Admitting: Primary Care

## 2019-10-12 NOTE — Telephone Encounter (Signed)
I have been speaking to the presercive about this the [place] said the npi # does not have to be chabged I call this# and it is closed Joellen Jersey

## 2019-10-15 ENCOUNTER — Telehealth: Payer: Self-pay | Admitting: Internal Medicine

## 2019-10-15 ENCOUNTER — Encounter (HOSPITAL_COMMUNITY): Payer: No Typology Code available for payment source

## 2019-10-15 NOTE — Telephone Encounter (Signed)
Spoke with pt, she wanted to speak to [facility] about the PET scan. He is stating he has a NPI and states this may be something we need. I read him the previous message from [place] but he still wants to speak to [place]. Please advise.

## 2019-10-15 NOTE — Telephone Encounter (Signed)
lmtcb Sally E Ottinger ° °

## 2019-10-15 NOTE — Telephone Encounter (Signed)
Pt has a auth to come to [employer] pulmonary however the NPI# on the auth does not cover the [facility] [place] Npi [facility] and the [facility] said asd long as it was sent to [facility] it would be covered [facility] says no so the appt has been cancelled and pt will most likely have to go to the [place] for is PET scan pt is aware of this Joellen Jersey

## 2019-10-15 NOTE — Telephone Encounter (Signed)
Spoke to pt he has a new auth from the [place] however the NPI# is till the same I will call [facility] tomorrow am and try to get this straighten out.m Joellen Jersey

## 2019-10-16 NOTE — Telephone Encounter (Signed)
I have called the [place] again and this time had to leave a message Joellen Jersey

## 2019-10-18 NOTE — Telephone Encounter (Signed)
Spoke to pt he is aware that I have received these forms and filled them out and sent info to [employer]

## 2019-10-18 NOTE — Telephone Encounter (Signed)
The [facility] is sending me paperwork to fill out and send back with records to get this pet scheduled lmtcb Joellen Jersey

## 2019-11-13 ENCOUNTER — Other Ambulatory Visit: Payer: Self-pay | Admitting: Family

## 2019-11-13 ENCOUNTER — Other Ambulatory Visit (HOSPITAL_COMMUNITY): Payer: Self-pay | Admitting: Family

## 2019-11-13 DIAGNOSIS — R911 Solitary pulmonary nodule: Secondary | ICD-10-CM

## 2019-11-16 ENCOUNTER — Other Ambulatory Visit (HOSPITAL_COMMUNITY): Payer: Self-pay | Admitting: Family

## 2019-11-16 DIAGNOSIS — R911 Solitary pulmonary nodule: Secondary | ICD-10-CM

## 2019-11-26 ENCOUNTER — Other Ambulatory Visit: Payer: Self-pay

## 2019-11-26 ENCOUNTER — Encounter (HOSPITAL_COMMUNITY)
Admission: RE | Admit: 2019-11-26 | Discharge: 2019-11-26 | Disposition: A | Discharge status: Home / Self Care | Payer: No Typology Code available for payment source | Source: Ambulatory Visit | Attending: Family | Admitting: Family

## 2019-11-26 DIAGNOSIS — R911 Solitary pulmonary nodule: Secondary | ICD-10-CM | POA: Diagnosis not present

## 2019-11-26 MED ORDER — FLUDEOXYGLUCOSE F - 18 (FDG) INJECTION
10.3000 | Freq: Once | INTRAVENOUS | Status: AC | PRN
  Administered 2019-11-26: 10.3 via INTRAVENOUS

## 2019-12-11 ENCOUNTER — Telehealth: Payer: Self-pay | Admitting: Internal Medicine

## 2019-12-11 NOTE — Telephone Encounter (Signed)
Pt left a message on my VM asking for PET results.  Stated he hasn't heard anything from Dr. Gustavus Bryant office about anything.   Looks like the PET the pt completed was ordered by Priscille Kluver, FNP.   I attempted to reach the patient, there was no answer & no option to leave VM to let the pt know for the PET results, he will need to contact Priscille Kluver' office & that his next upcoming appt w/ MW is 8/16 @ 10:15.    Will attempt to reach the pt again.

## 2019-12-12 NOTE — Telephone Encounter (Signed)
Looks like [employer] set this up and ideally he needs ov to review - LMOM today I would call him but see he may be in [place] ? If get to see this week or add on as televisit to give him the time needed to sort out options

## 2019-12-12 NOTE — Telephone Encounter (Signed)
lmtcb for pt. When pt calls back please schedule an OV or televisit with MW or Eustaquio Maize to review PET.

## 2019-12-12 NOTE — Telephone Encounter (Signed)
I spoke with the pt  Advised that we did not order the PET scan done on 11/26/19  It was ordered by a Barry Kluver, NP Family Medicine in [facility] [address]  He states that this is incorrect and that he has never been seen by her nor heard of this NP  Wants PET results from Dr Barry Lawrence  Please advise thanks

## 2019-12-13 NOTE — Telephone Encounter (Signed)
Pt is scheduled for an appt with MW 7/23. Nothing further needed.

## 2019-12-14 ENCOUNTER — Other Ambulatory Visit: Payer: Self-pay

## 2019-12-14 ENCOUNTER — Encounter: Payer: Self-pay | Admitting: Internal Medicine

## 2019-12-14 ENCOUNTER — Ambulatory Visit (INDEPENDENT_AMBULATORY_CARE_PROVIDER_SITE_OTHER): Payer: No Typology Code available for payment source | Admitting: Internal Medicine

## 2019-12-14 DIAGNOSIS — J449 Chronic obstructive pulmonary disease, unspecified: Secondary | ICD-10-CM | POA: Diagnosis not present

## 2019-12-14 DIAGNOSIS — F17210 Nicotine dependence, cigarettes, uncomplicated: Secondary | ICD-10-CM | POA: Diagnosis not present

## 2019-12-14 DIAGNOSIS — R911 Solitary pulmonary nodule: Secondary | ICD-10-CM

## 2019-12-14 DIAGNOSIS — I6523 Occlusion and stenosis of bilateral carotid arteries: Secondary | ICD-10-CM

## 2019-12-14 NOTE — Patient Instructions (Addendum)
I very strongly recommend you get the moderna or pfizer vaccine as soon as possible based on your risk of dying from the virus  and the proven safety and benefit of these vaccines against even the delta variant.  This can save your life as well as  those of your loved ones,  especially if they are also not vaccinated.     The key is to stop smoking completely before smoking completely stops you!  Try albuterol 15 min before an activity that you know would make you short of breath and see if it makes any difference and if makes none then don't take it after activity unless you can't catch your breath.      I will call you to set up a lung biopsy   Please schedule a follow up visit in 3 months but call sooner if needed

## 2019-12-14 NOTE — Progress Notes (Signed)
Subjective:    Patient ID: Barry Lawrence, male    DOB: November 03, 1945  MRN: [id number]   Brief patient profile:  39 yowm active smoker followed by [facility] for OSA being considered for AAA surgery and sent for preop eval by Dr [name] to pulmonary clinic 03/26/2014    03/26/2014 1st [facility] office visit/ Tee Richeson   Chief Complaint  Patient presents with  . Pulmonary Consult    Referred by Dr. [name] for pulmonary clearance for AAA surgery.  Pt c/o SOB "over a period of time". He states that his legs usually give out before his breathing bothers him.    not on any resp rx at this point  Sleeps ok flat on cpap  with min am cough production thick and white and no tendency to acute excacerbations rec  You have very little copd and unlikely you ever will if you stop smoking now (should stop for at least 2 weeks preop anyway) The main problem with surgery will be the need to lie flat and the effect of your weight on your breathing so early mobilization and minimum sedating meds will be key   Admit date: 10/06/2014 Discharge date: 10/08/2014   Recommendations for Outpatient Follow-up:  1. Follow up with nephrologist at [facility] as scheduled. Lab work scheduled for 10/09/14. Review of meds given worsening renal failure. 2. Follow up with PCP 1-2 weeks for evaluation of cellulitis.  3. Follow up with INR check and dosing of coumadin 10/09/14  Discharge Diagnoses:  Active Problems:  CAD (coronary artery disease)  Hypertensive heart disease  Hyperlipidemia  COPD GOLD II still smoking   Cigarette smoker  CKD (chronic kidney disease), stage III  PAD (peripheral artery disease)  Atrial fibrillation  Acute on chronic renal failure  Long-term (current) use of anticoagulants  Cellulitis of left lower extremity  Cellulitis     10/25/2014 f/u ov/[name] re: GOLD II copd/ still smoking  Chief Complaint  Patient presents with  . Follow-up    Pt states breathing is no better since last  visit here in Nov 2015. He states that he gets SOB with just talking and walking short distances.  He has been having swelling in his legs for the past couple wks.   Sleeps ok on cpap per [facility] - really helps noct sob/ wheeze  Swelling in legs getting worse during day  Neb helped breathing perioperatively  Doe x > nl pace or inclines  = MMRC 1  rec Try spiriva respimat 2 puffs each am x 2 weeks and if you like it fill it As long as you stop smoking now, you should be able to preserve adequate lung function as your COPD is mild When fluid builds up in your legs it is likely in your lungs and cause you to wheeze and look like you have copd flare but you probably don't > see you nephrologist We need to change your coreg to alternative called bisoprol because coreg can cause wheezing especially in higher doses. Take prilosec Take 30-60 min before first meal of the day     05/28/2019  Consultation / re-establish ov/[name] re: spn x 1.3 cm /copd prev GOLD II but still smoking  Chief Complaint  Patient presents with  . Consult    COPD, Nodule, SOB, cough, wheezing, mucus is white  Dyspnea:  Only time he leaves home  is to see doctor, no longer doing grocery shopping, uses [facility] parking = MMRC3 = can't walk 100 yards even at  a slow pace at a flat grade s stopping due to sob   Cough: mucoid  Prod x sev tps/day esp p stirs in am assoc with pnds watery, worse with odors / on ppi bid ac  Sleeping: no respiratory symptoms  SABA use: once a month neb 02: none  Just started hd x nov 2020  To see [facility] 1/11/ re 4.2 cm AAA  rec Try zyrtec 10 mg in evening (if it makes you sleepy, if not ok to take in am) to see if helps the drippy nose. The key is to stop smoking completely before smoking completely stops you! Strongly recommend you take the covid19 vaccine if offered  Please schedule a follow up visit in 3 months but call sooner if needed with pfts   here     12/14/2019  f/u ov/[place] office/[name] re:  copd gold II / L apical lung nodule Chief Complaint  Patient presents with  . Follow-up    Patient feels about the same as last visit. Does not feel like Spiriva helped him. Has shortness of breath with exertion and is exhausted all the time. He started in November. Cough with clear sputum  Dyspnea:  MMRC3 = can't walk 100 yards even at a slow pace at a flat grade s stopping/ no better on spiriva  Cough in am's, mucoid  mostly in am better p coffee  SABA use:   not using neb 02  none   No obvious day to day or daytime variability or assoc  purulent sputum or mucus plugs or hemoptysis or cp or chest tightness, subjective wheeze or overt sinus or hb symptoms.   sleeping without nocturnal  or early am exacerbation  of respiratory  c/o's or need for noct saba. Also denies any obvious fluctuation of symptoms with weather or environmental changes or other aggravating or alleviating factors except as outlined above   No unusual exposure hx or h/o childhood pna/ asthma or knowledge of premature birth.  Current Allergies, Complete Past Medical History, Past Surgical History, Family History, and Social History were reviewed in [employer] record.  ROS  The following are not active complaints unless bolded Hoarseness, sore throat, dysphagia, dental problems, itching, sneezing,  nasal congestion or discharge of excess mucus or purulent secretions, ear ache,   fever, chills, sweats, unintended wt loss or wt gain, classically pleuritic or exertional cp,  orthopnea pnd or arm/hand swelling  or leg swelling, presyncope, palpitations, abdominal pain, anorexia, nausea, vomiting, diarrhea  or change in bowel habits or change in bladder habits, change in stools or change in urine, dysuria, hematuria,  rash, arthralgias, visual complaints, headache, numbness, weakness or ataxia or problems with walking or coordination,  change in mood or  memory.        Current Meds  Medication Sig  .  albuterol (ACCUNEB) 0.63 MG/3ML nebulizer solution Take 1 ampule by nebulization every 6 (six) hours as needed for wheezing.  [name] amLODipine (NORVASC) 5 MG tablet Take 5 mg by mouth daily. Pt does not take on days he goes to dialysis. (Tues. Thurs, & Sat)  . atorvastatin (LIPITOR) 80 MG tablet Take 80 mg by mouth at bedtime.   . famotidine (PEPCID) 20 MG tablet Take by mouth.  . Ferric Citrate (AURYXIA PO) Take by mouth.  . finasteride (PROSCAR) 5 MG tablet Take 5 mg by mouth daily.  . furosemide (LASIX) 80 MG tablet Take 2 tablets (160 mg total) by mouth 2 (two) times daily. (Patient  taking differently: Take 160 mg by mouth 2 (two) times daily. pT DOES NOT TAKE ON DIALYSIS DAYS. (TUES, THURS & SAT))  . tamsulosin (FLOMAX) 0.4 MG CAPS capsule Take 0.4 mg by mouth as directed. Take 2 tablets daily at night  . warfarin (COUMADIN) 5 MG tablet Take 1 tablet daily except 1/2 tablet on Mondays, Wednesdays and Fridays or as directed (Patient taking differently: 5 mg. Take 1 tablet daily except 1/2 tablet on Mondays, Wednesdays and Fridays or as directed)                   Current Meds  Medication Sig  . albuterol (ACCUNEB) 0.63 MG/3ML nebulizer solution Take 1 ampule by nebulization every 6 (six) hours as needed for wheezing.  [name] amLODipine (NORVASC) 2.5 MG tablet Take 1 tablet (2.5 mg total) by mouth daily.  [name] atorvastatin (LIPITOR) 80 MG tablet Take 80 mg by mouth at bedtime.   . bisoprolol (ZEBETA) 5 MG tablet Take 5 mg by mouth daily.  . finasteride (PROSCAR) 5 MG tablet Take 5 mg by mouth daily.  . furosemide (LASIX) 80 MG tablet Take 2 tablets (160 mg total) by mouth 2 (two) times daily.  . pantoprazole (PROTONIX) 20 MG tablet Take 20 mg by mouth 2 (two) times daily.  . tamsulosin (FLOMAX) 0.4 MG CAPS capsule Take 0.4 mg by mouth as directed. Take 2 tablets daily at night  . warfarin (COUMADIN) 5 MG tablet Take 1 tablet daily except 1/2 tablet on Mondays, Wednesdays and Fridays or as directed  (Patient taking differently: 5 mg. Take 1 tablet daily except 1/2 tablet on Mondays, Wednesdays and Fridays or as directed)  . [DISCONTINUED] calcitRIOL (ROCALTROL) 0.25 MCG capsule Take 0.25 mcg by mouth daily.  . [DISCONTINUED] HYDROcodone-acetaminophen (NORCO/VICODIN) 5-325 MG tablet Take 1 tablet by mouth every 6 (six) hours as needed for severe pain.  . [DISCONTINUED] magnesium oxide (MAG-OX) 400 MG tablet Take 400 mg by mouth 2 (two) times daily.  . [DISCONTINUED] omeprazole (PRILOSEC) 20 MG capsule Take 20 mg by mouth at bedtime.                     Objective:   Physical Exam       12/14/2019       169  05/28/2019         175  10/25/2014         188     03/26/14 201 lb (91.173 kg)  03/18/14 197 lb 8 oz (89.585 kg)  02/19/14 196 lb (88.905 kg)     Elderly wm nad smoker's rattle   Vital signs reviewed  12/14/2019  - Note at rest 02 sats  98% on RA     HEENT : pt wearing mask not removed for exam due to covid -19 concerns.    NECK :  without JVD/Nodes/TM/ nl carotid upstrokes bilaterally   LUNGS: no acc muscle use,  Mod barrel  contour chest wall with bilateral  Distant bs s audible wheeze and  without cough on insp or exp maneuvers and mod  Hyperresonant  to  percussion bilaterally     CV:  RRR  no s3 or murmur or increase in P2, and no edema   ABD:  soft and nontender with pos mid insp Hoover's  in the supine position. No bruits or organomegaly appreciated, bowel sounds nl Large ventral hernia   MS:     ext warm without deformities, calf tenderness, cyanosis or clubbing No obvious  joint restrictions   SKIN: warm and dry without lesions    NEURO:  alert, approp, nl sensorium with  no motor or cerebellar deficits apparent.                 Assessment & Plan:

## 2019-12-15 ENCOUNTER — Encounter: Payer: Self-pay | Admitting: Internal Medicine

## 2019-12-15 NOTE — Assessment & Plan Note (Signed)
Active smoker 03/26/2014 spirometry FEV1  1.51 (51%) ratio 69 off all rx  - 10/25/2014 p extensive coaching respimat effectiveness = 90% try spiriva 2 pffs each am   10/05/2019 PFTs 10/05/2019 FEV1 1.46 (54%), ratio 59 - Re- try Spiriva respimat 2.30mcg two puffs once daily in the morning  > no better  Not clear he has any reversible component at present to his airflow obst and still smoking  I spent extra time with pt today reviewing appropriate use of albuterol for prn use on exertion with the following points: 1) saba is for relief of sob that does not improve by walking a slower pace or resting but rather if the pt does not improve after trying this first. 2) If the pt is convinced, as many are, that saba helps recover from activity faster then it's easy to tell if this is the case by re-challenging : ie stop, take the inhaler, then p 5 minutes try the exact same activity (intensity of workload) that just caused the symptoms and see if they are substantially diminished or not after saba 3) if there is an activity that reproducibly causes the symptoms, try the saba 15 min before the activity on alternate days   If in fact the saba really does help, then fine to continue to use it prn but advised may need to look closer at the maintenance regimen being used to achieve better control of airways disease with exertion. (eg  Lama/laba next option).

## 2019-12-15 NOTE — Assessment & Plan Note (Signed)
Active smoker CT chest 03/05/14 6 mm pulmonary nodule at the left lung apex.   - repeat 09/04/2014 > no change   - repeat 09/04/15 > no change > final CT 09/06/2016 . Ongoing stability of bilateral pulmonary nodules, most consistent with benign etiologies. -  Chest LDSCT 04/18/2019 1. Lung-RADS 4X, highly suspicious.  A left apical pulmonary nodule of volume derived equivalent diameter 13.2 mm has enlarged when compared to prior CTs, including 09/06/2016.  PET 11/27/19 1. The dominant pleural-based 1.4 cm left apical nodule has a maximum SUV of 8.0, high suspicion for malignancy. Adjacent irregularity of the left first rib is thought to be due to a chronic nonunited fracture rather than necessarily related to invasion by the lesion. 2. Focal accentuated activity in the right posterior peripheral zone of the prostate gland at about the mid gland level. This is nonspecific but prostate neoplasm cannot be excluded.  Strongly doubt surgical candidate based on overall health, underlying vascular dz but could benefit from stereotactic RT   Will discuss with IR and if turned down then have reviewed by tumor board.  Discussed in detail all the  indications, usual  risks and alternatives  relative to the benefits with patient who agrees to proceed with w/u as outlined.

## 2019-12-15 NOTE — Assessment & Plan Note (Addendum)
Counseled re importance of smoking cessation but did not meet time criteria for separate billing     Pt informed of the seriousness of COVID 19 infection as a direct risk to lung health  and safey and to close contacts and should continue to wear a facemask in public and minimize exposure to public locations but especially avoid any area or activity where non-close contacts are not observing distancing or wearing an appropriate face mask.  I strongly recommended she take either of the vaccines available through local drugstores based on updated information on millions of Americans treated with the [place] products  which have proven both safe and  effective even against the new delta variant.     >>> f/u in this clinic in 3 m   Medical decision making was a moderate level of complexity in this case because of  two major  conditions /diagnoses requiring extra time for  H and P, chart review, counseling including for covid 19 and smoking cessations  and generating customized AVS unique to this office visit and charting.   Each maintenance medication was reviewed in detail including emphasizing most importantly the difference between maintenance and prns and under what circumstances the prns are to be triggered using an action plan format where appropriate. Please see avs for details which were reviewed in writing by both me and my nurse and patient given a written copy highlighted where appropriate with yellow highlighter for the patient's continued care at home along with an updated version of their medications.  Patient was asked to maintain medication reconciliation by comparing this list to the actual medications being used at home and to contact this office right away if there is a conflict or discrepancy.

## 2019-12-24 ENCOUNTER — Ambulatory Visit: Payer: No Typology Code available for payment source | Admitting: Cardiology

## 2019-12-26 ENCOUNTER — Telehealth: Payer: Self-pay | Admitting: *Deleted

## 2019-12-26 DIAGNOSIS — R911 Solitary pulmonary nodule: Secondary | ICD-10-CM

## 2019-12-26 NOTE — Telephone Encounter (Signed)
-----   Message from Tanda Rockers, MD sent at 12/26/2019  8:56 AM EDT ----- Refer to [facility] pt aware  Also send copy of PET to his urologist  ? Dr Gloriann Loan with Dr [facility]al[facility]rat group across from [place] office (may be a PA or NP as I don't recognize his name)

## 2019-12-26 NOTE — Telephone Encounter (Signed)
Referral to [place] made and I have sent copy of PET to Dr Link Snuffer

## 2020-01-07 ENCOUNTER — Telehealth: Payer: Self-pay | Admitting: *Deleted

## 2020-01-07 ENCOUNTER — Ambulatory Visit: Payer: No Typology Code available for payment source | Admitting: Internal Medicine

## 2020-01-07 NOTE — Telephone Encounter (Signed)
I called to schedule Barry Lawrence to be seen at [facility] this week. He is not sure if it is authed by [place]. I contacted Dr. Gustavus Bryant office to check.

## 2020-01-08 ENCOUNTER — Telehealth: Payer: Self-pay | Admitting: *Deleted

## 2020-01-08 NOTE — Telephone Encounter (Signed)
I received an update from the pulmonary office that [name] for visit has not been approved from the [place].  They are working on this.  I called [name] to update but was unable to reach him.  I did leave vm message with the information and my phone number to call if he had questions.

## 2020-01-09 ENCOUNTER — Telehealth: Payer: Self-pay | Admitting: *Deleted

## 2020-01-09 NOTE — Telephone Encounter (Signed)
I called Barry Lawrence to see if he received my message.  I spoke with him.  He did get my message.  I told him the Dr. Gustavus Bryant office is working on authorization from the [place] so he can be seen here.  I explained that I contacted [facility] Gustav[facility] yesterday and [facility] has still not [facility] to be [facility]. Mr. Gottwald is frustrated and I listed to him as he explained. I told him once it is authorized that I would call him.  He verbalized understanding.

## 2020-01-13 ENCOUNTER — Emergency Department (HOSPITAL_COMMUNITY)
Admission: EM | Admit: 2020-01-13 | Discharge: 2020-01-13 | Disposition: A | Discharge status: Home / Self Care | Payer: No Typology Code available for payment source | Attending: Emergency Medicine | Admitting: Emergency Medicine

## 2020-01-13 ENCOUNTER — Encounter (HOSPITAL_COMMUNITY): Payer: Self-pay | Admitting: *Deleted

## 2020-01-13 ENCOUNTER — Other Ambulatory Visit: Payer: Self-pay

## 2020-01-13 ENCOUNTER — Emergency Department (HOSPITAL_COMMUNITY): Payer: No Typology Code available for payment source

## 2020-01-13 DIAGNOSIS — Z5321 Procedure and treatment not carried out due to patient leaving prior to being seen by health care provider: Secondary | ICD-10-CM | POA: Diagnosis not present

## 2020-01-13 DIAGNOSIS — R079 Chest pain, unspecified: Secondary | ICD-10-CM | POA: Diagnosis present

## 2020-01-13 LAB — BASIC METABOLIC PANEL
Anion gap: 15 (ref 5–15)
BUN: 37 mg/dL — ABNORMAL HIGH (ref 8–23)
CO2: 23 mmol/L (ref 22–32)
Calcium: 9.6 mg/dL (ref 8.9–10.3)
Chloride: 96 mmol/L — ABNORMAL LOW (ref 98–111)
Creatinine, Ser: 4.62 mg/dL — ABNORMAL HIGH (ref 0.61–1.24)
GFR calc Af Amer: 14 mL/min — ABNORMAL LOW (ref >60)
GFR calc non Af Amer: 12 mL/min — ABNORMAL LOW (ref >60)
Glucose, Bld: 135 mg/dL — ABNORMAL HIGH (ref 70–99)
Potassium: 3.9 mmol/L (ref 3.5–5.1)
Sodium: 134 mmol/L — ABNORMAL LOW (ref 135–145)

## 2020-01-13 LAB — CBC
HCT: 52.4 % — ABNORMAL HIGH (ref 39.0–52.0)
Hemoglobin: 16.8 g/dL (ref 13.0–17.0)
MCH: 31.6 pg (ref 26.0–34.0)
MCHC: 32.1 g/dL (ref 30.0–36.0)
MCV: 98.7 fL (ref 80.0–100.0)
Platelets: 196 10*3/uL (ref 150–400)
RBC: 5.31 MIL/uL (ref 4.22–5.81)
RDW: 15.4 % (ref 11.5–15.5)
WBC: 7.8 10*3/uL (ref 4.0–10.5)
nRBC: 0 % (ref 0.0–0.2)

## 2020-01-13 LAB — LIPASE, BLOOD: Lipase: 28 U/L (ref 11–51)

## 2020-01-13 LAB — TROPONIN I (HIGH SENSITIVITY): Troponin I (High Sensitivity): 39 ng/L — ABNORMAL HIGH (ref <18)

## 2020-01-13 NOTE — ED Triage Notes (Signed)
Pt c/o epigastric pain that has been intermittent for the past 3 days, pain is associated with sob, dizzy that has gotten worse, right flank pain,

## 2020-01-14 ENCOUNTER — Encounter: Payer: Self-pay | Admitting: *Deleted

## 2020-01-14 NOTE — Progress Notes (Signed)
I followed up with referring office to see if the [place] has auth patient visit.

## 2020-01-14 NOTE — Progress Notes (Signed)
I received a notification from referring office that they were on hold for 6 hours trying to get his auth approved.  They will continue to try.

## 2020-01-27 ENCOUNTER — Emergency Department (HOSPITAL_COMMUNITY)
Admission: EM | Admit: 2020-01-27 | Discharge: 2020-01-27 | Disposition: A | Discharge status: Home / Self Care | Payer: No Typology Code available for payment source | Attending: Emergency Medicine | Admitting: Emergency Medicine

## 2020-01-27 ENCOUNTER — Encounter (HOSPITAL_COMMUNITY): Payer: Self-pay

## 2020-01-27 ENCOUNTER — Other Ambulatory Visit: Payer: Self-pay

## 2020-01-27 DIAGNOSIS — Z5321 Procedure and treatment not carried out due to patient leaving prior to being seen by health care provider: Secondary | ICD-10-CM | POA: Insufficient documentation

## 2020-01-27 DIAGNOSIS — R6884 Jaw pain: Secondary | ICD-10-CM | POA: Insufficient documentation

## 2020-01-27 LAB — CBC WITH DIFFERENTIAL/PLATELET
Abs Immature Granulocytes: 0.03 10*3/uL (ref 0.00–0.07)
Basophils Absolute: 0.1 10*3/uL (ref 0.0–0.1)
Basophils Relative: 1 %
Eosinophils Absolute: 0.1 10*3/uL (ref 0.0–0.5)
Eosinophils Relative: 1 %
HCT: 46 % (ref 39.0–52.0)
Hemoglobin: 14.8 g/dL (ref 13.0–17.0)
Immature Granulocytes: 0 %
Lymphocytes Relative: 11 %
Lymphs Abs: 1 10*3/uL (ref 0.7–4.0)
MCH: 31.2 pg (ref 26.0–34.0)
MCHC: 32.2 g/dL (ref 30.0–36.0)
MCV: 97 fL (ref 80.0–100.0)
Monocytes Absolute: 0.8 10*3/uL (ref 0.1–1.0)
Monocytes Relative: 9 %
Neutro Abs: 7.5 10*3/uL (ref 1.7–7.7)
Neutrophils Relative %: 78 %
Platelets: 241 10*3/uL (ref 150–400)
RBC: 4.74 MIL/uL (ref 4.22–5.81)
RDW: 15.5 % (ref 11.5–15.5)
WBC: 9.5 10*3/uL (ref 4.0–10.5)
nRBC: 0 % (ref 0.0–0.2)

## 2020-01-27 LAB — COMPREHENSIVE METABOLIC PANEL
ALT: 16 U/L (ref 0–44)
AST: 14 U/L — ABNORMAL LOW (ref 15–41)
Albumin: 3.5 g/dL (ref 3.5–5.0)
Alkaline Phosphatase: 115 U/L (ref 38–126)
Anion gap: 12 (ref 5–15)
BUN: 47 mg/dL — ABNORMAL HIGH (ref 8–23)
CO2: 24 mmol/L (ref 22–32)
Calcium: 9.8 mg/dL (ref 8.9–10.3)
Chloride: 99 mmol/L (ref 98–111)
Creatinine, Ser: 4.11 mg/dL — ABNORMAL HIGH (ref 0.61–1.24)
GFR calc Af Amer: 16 mL/min — ABNORMAL LOW (ref >60)
GFR calc non Af Amer: 13 mL/min — ABNORMAL LOW (ref >60)
Glucose, Bld: 95 mg/dL (ref 70–99)
Potassium: 4.3 mmol/L (ref 3.5–5.1)
Sodium: 135 mmol/L (ref 135–145)
Total Bilirubin: 0.6 mg/dL (ref 0.3–1.2)
Total Protein: 6.6 g/dL (ref 6.5–8.1)

## 2020-01-27 NOTE — ED Notes (Signed)
Pt called to check vitals for third time without response

## 2020-01-27 NOTE — ED Triage Notes (Signed)
Patient complains of 3 weeks of right jaw pain. [facility] and [facility] and taken [facility] with no relief. Patient states here for pain control. Last dialysis yesterday

## 2020-01-27 NOTE — ED Notes (Signed)
Called pt to recheck vitals. No response.  

## 2020-01-27 NOTE — ED Notes (Signed)
Pt didn't respond when called for vitals

## 2020-02-05 ENCOUNTER — Inpatient Hospital Stay (HOSPITAL_COMMUNITY)
Admission: EM | Admit: 2020-02-05 | Discharge: 2020-02-07 | DRG: 064 | Disposition: A | Discharge status: Home / Self Care | Payer: No Typology Code available for payment source | Attending: Internal Medicine | Admitting: Internal Medicine

## 2020-02-05 ENCOUNTER — Other Ambulatory Visit: Payer: Self-pay

## 2020-02-05 ENCOUNTER — Emergency Department (HOSPITAL_COMMUNITY): Payer: No Typology Code available for payment source

## 2020-02-05 DIAGNOSIS — I4821 Permanent atrial fibrillation: Secondary | ICD-10-CM | POA: Diagnosis present

## 2020-02-05 DIAGNOSIS — N400 Enlarged prostate without lower urinary tract symptoms: Secondary | ICD-10-CM | POA: Diagnosis present

## 2020-02-05 DIAGNOSIS — N186 End stage renal disease: Secondary | ICD-10-CM | POA: Diagnosis present

## 2020-02-05 DIAGNOSIS — D631 Anemia in chronic kidney disease: Secondary | ICD-10-CM | POA: Diagnosis present

## 2020-02-05 DIAGNOSIS — F17210 Nicotine dependence, cigarettes, uncomplicated: Secondary | ICD-10-CM | POA: Diagnosis present

## 2020-02-05 DIAGNOSIS — I2510 Atherosclerotic heart disease of native coronary artery without angina pectoris: Secondary | ICD-10-CM | POA: Diagnosis present

## 2020-02-05 DIAGNOSIS — I639 Cerebral infarction, unspecified: Secondary | ICD-10-CM | POA: Diagnosis not present

## 2020-02-05 DIAGNOSIS — N2581 Secondary hyperparathyroidism of renal origin: Secondary | ICD-10-CM | POA: Diagnosis present

## 2020-02-05 DIAGNOSIS — I120 Hypertensive chronic kidney disease with stage 5 chronic kidney disease or end stage renal disease: Secondary | ICD-10-CM | POA: Diagnosis present

## 2020-02-05 DIAGNOSIS — E1151 Type 2 diabetes mellitus with diabetic peripheral angiopathy without gangrene: Secondary | ICD-10-CM | POA: Diagnosis present

## 2020-02-05 DIAGNOSIS — Z8679 Personal history of other diseases of the circulatory system: Secondary | ICD-10-CM

## 2020-02-05 DIAGNOSIS — I6381 Other cerebral infarction due to occlusion or stenosis of small artery: Secondary | ICD-10-CM | POA: Diagnosis present

## 2020-02-05 DIAGNOSIS — I4510 Unspecified right bundle-branch block: Secondary | ICD-10-CM | POA: Diagnosis present

## 2020-02-05 DIAGNOSIS — Z20822 Contact with and (suspected) exposure to covid-19: Secondary | ICD-10-CM | POA: Diagnosis present

## 2020-02-05 DIAGNOSIS — G500 Trigeminal neuralgia: Secondary | ICD-10-CM | POA: Diagnosis present

## 2020-02-05 DIAGNOSIS — I629 Nontraumatic intracranial hemorrhage, unspecified: Secondary | ICD-10-CM | POA: Diagnosis not present

## 2020-02-05 DIAGNOSIS — R270 Ataxia, unspecified: Secondary | ICD-10-CM | POA: Diagnosis present

## 2020-02-05 DIAGNOSIS — E1122 Type 2 diabetes mellitus with diabetic chronic kidney disease: Secondary | ICD-10-CM | POA: Diagnosis present

## 2020-02-05 DIAGNOSIS — I619 Nontraumatic intracerebral hemorrhage, unspecified: Secondary | ICD-10-CM | POA: Diagnosis not present

## 2020-02-05 DIAGNOSIS — E785 Hyperlipidemia, unspecified: Secondary | ICD-10-CM | POA: Diagnosis present

## 2020-02-05 DIAGNOSIS — Z992 Dependence on renal dialysis: Secondary | ICD-10-CM

## 2020-02-05 DIAGNOSIS — R29702 NIHSS score 2: Secondary | ICD-10-CM | POA: Diagnosis present

## 2020-02-05 DIAGNOSIS — I252 Old myocardial infarction: Secondary | ICD-10-CM

## 2020-02-05 DIAGNOSIS — Z8249 Family history of ischemic heart disease and other diseases of the circulatory system: Secondary | ICD-10-CM | POA: Diagnosis not present

## 2020-02-05 DIAGNOSIS — J449 Chronic obstructive pulmonary disease, unspecified: Secondary | ICD-10-CM | POA: Diagnosis present

## 2020-02-05 DIAGNOSIS — I6300 Cerebral infarction due to thrombosis of unspecified precerebral artery: Secondary | ICD-10-CM | POA: Diagnosis present

## 2020-02-05 DIAGNOSIS — Z951 Presence of aortocoronary bypass graft: Secondary | ICD-10-CM | POA: Diagnosis not present

## 2020-02-05 DIAGNOSIS — I340 Nonrheumatic mitral (valve) insufficiency: Secondary | ICD-10-CM | POA: Diagnosis not present

## 2020-02-05 DIAGNOSIS — I342 Nonrheumatic mitral (valve) stenosis: Secondary | ICD-10-CM | POA: Diagnosis not present

## 2020-02-05 DIAGNOSIS — I10 Essential (primary) hypertension: Secondary | ICD-10-CM | POA: Diagnosis not present

## 2020-02-05 DIAGNOSIS — I351 Nonrheumatic aortic (valve) insufficiency: Secondary | ICD-10-CM | POA: Diagnosis not present

## 2020-02-05 DIAGNOSIS — Z79899 Other long term (current) drug therapy: Secondary | ICD-10-CM

## 2020-02-05 DIAGNOSIS — G459 Transient cerebral ischemic attack, unspecified: Secondary | ICD-10-CM | POA: Diagnosis not present

## 2020-02-05 DIAGNOSIS — Z7901 Long term (current) use of anticoagulants: Secondary | ICD-10-CM

## 2020-02-05 LAB — URINALYSIS, ROUTINE W REFLEX MICROSCOPIC
Bacteria, UA: NONE SEEN
Bilirubin Urine: NEGATIVE
Glucose, UA: NEGATIVE mg/dL
Hgb urine dipstick: NEGATIVE
Ketones, ur: NEGATIVE mg/dL
Nitrite: NEGATIVE
Protein, ur: 100 mg/dL — AB
Specific Gravity, Urine: 1.013 (ref 1.005–1.030)
pH: 7 (ref 5.0–8.0)

## 2020-02-05 LAB — DIFFERENTIAL
Abs Immature Granulocytes: 0.06 10*3/uL (ref 0.00–0.07)
Basophils Absolute: 0.1 10*3/uL (ref 0.0–0.1)
Basophils Relative: 1 %
Eosinophils Absolute: 0.1 10*3/uL (ref 0.0–0.5)
Eosinophils Relative: 1 %
Immature Granulocytes: 1 %
Lymphocytes Relative: 11 %
Lymphs Abs: 1.1 10*3/uL (ref 0.7–4.0)
Monocytes Absolute: 0.8 10*3/uL (ref 0.1–1.0)
Monocytes Relative: 9 %
Neutro Abs: 7.4 10*3/uL (ref 1.7–7.7)
Neutrophils Relative %: 77 %

## 2020-02-05 LAB — COMPREHENSIVE METABOLIC PANEL
ALT: 14 U/L (ref 0–44)
AST: 13 U/L — ABNORMAL LOW (ref 15–41)
Albumin: 3.5 g/dL (ref 3.5–5.0)
Alkaline Phosphatase: 91 U/L (ref 38–126)
Anion gap: 11 (ref 5–15)
BUN: 51 mg/dL — ABNORMAL HIGH (ref 8–23)
CO2: 25 mmol/L (ref 22–32)
Calcium: 9.1 mg/dL (ref 8.9–10.3)
Chloride: 99 mmol/L (ref 98–111)
Creatinine, Ser: 4.83 mg/dL — ABNORMAL HIGH (ref 0.61–1.24)
GFR calc Af Amer: 13 mL/min — ABNORMAL LOW (ref >60)
GFR calc non Af Amer: 11 mL/min — ABNORMAL LOW (ref >60)
Glucose, Bld: 100 mg/dL — ABNORMAL HIGH (ref 70–99)
Potassium: 4.5 mmol/L (ref 3.5–5.1)
Sodium: 135 mmol/L (ref 135–145)
Total Bilirubin: 0.4 mg/dL (ref 0.3–1.2)
Total Protein: 6.4 g/dL — ABNORMAL LOW (ref 6.5–8.1)

## 2020-02-05 LAB — CBC
HCT: 42 % (ref 39.0–52.0)
Hemoglobin: 13.1 g/dL (ref 13.0–17.0)
MCH: 31.3 pg (ref 26.0–34.0)
MCHC: 31.2 g/dL (ref 30.0–36.0)
MCV: 100.5 fL — ABNORMAL HIGH (ref 80.0–100.0)
Platelets: 281 10*3/uL (ref 150–400)
RBC: 4.18 MIL/uL — ABNORMAL LOW (ref 4.22–5.81)
RDW: 16.3 % — ABNORMAL HIGH (ref 11.5–15.5)
WBC: 9.5 10*3/uL (ref 4.0–10.5)
nRBC: 0 % (ref 0.0–0.2)

## 2020-02-05 LAB — RAPID URINE DRUG SCREEN, HOSP PERFORMED
Amphetamines: NOT DETECTED
Barbiturates: NOT DETECTED
Benzodiazepines: NOT DETECTED
Cocaine: NOT DETECTED
Opiates: POSITIVE — AB
Tetrahydrocannabinol: NOT DETECTED

## 2020-02-05 LAB — HEMOGLOBIN A1C
Hgb A1c MFr Bld: 5.5 % (ref 4.8–5.6)
Mean Plasma Glucose: 111.15 mg/dL

## 2020-02-05 LAB — LIPID PANEL
Cholesterol: 74 mg/dL (ref 0–200)
HDL: 30 mg/dL — ABNORMAL LOW (ref >40)
LDL Cholesterol: 30 mg/dL (ref 0–99)
Total CHOL/HDL Ratio: 2.5 RATIO
Triglycerides: 68 mg/dL (ref <150)
VLDL: 14 mg/dL (ref 0–40)

## 2020-02-05 LAB — SARS CORONAVIRUS 2 BY RT PCR (HOSPITAL ORDER, PERFORMED IN ~~LOC~~ HOSPITAL LAB): SARS Coronavirus 2: NEGATIVE

## 2020-02-05 LAB — APTT: aPTT: 55 seconds — ABNORMAL HIGH (ref 24–36)

## 2020-02-05 LAB — PROTIME-INR
INR: 3.2 — ABNORMAL HIGH (ref 0.8–1.2)
Prothrombin Time: 32 seconds — ABNORMAL HIGH (ref 11.4–15.2)

## 2020-02-05 LAB — ETHANOL: Alcohol, Ethyl (B): <10 mg/dL (ref <10)

## 2020-02-05 LAB — ABO/RH: ABO/RH(D): A POS

## 2020-02-05 MED ORDER — NICOTINE 7 MG/24HR TD PT24
7.0000 mg | MEDICATED_PATCH | Freq: Every day | TRANSDERMAL | Status: DC
  Filled 2020-02-05 (×4): qty 1

## 2020-02-05 MED ORDER — SODIUM CHLORIDE 0.9% IV SOLUTION: Freq: Once | INTRAVENOUS | Status: AC

## 2020-02-05 MED ORDER — ATORVASTATIN CALCIUM 80 MG PO TABS
80.0000 mg | ORAL_TABLET | Freq: Every day | ORAL | Status: DC
  Administered 2020-02-05 – 2020-02-06 (×2): 80 mg via ORAL
  Filled 2020-02-05 (×2): qty 1

## 2020-02-05 MED ORDER — ACETAMINOPHEN 325 MG PO TABS
650.0000 mg | ORAL_TABLET | Freq: Four times a day (QID) | ORAL | Status: DC | PRN
  Administered 2020-02-05 – 2020-02-07 (×4): 650 mg via ORAL
  Filled 2020-02-05 (×3): qty 2

## 2020-02-05 MED ORDER — ONDANSETRON HCL 4 MG PO TABS: 4.0000 mg | ORAL_TABLET | Freq: Four times a day (QID) | ORAL | Status: DC | PRN

## 2020-02-05 MED ORDER — TAMSULOSIN HCL 0.4 MG PO CAPS
0.4000 mg | ORAL_CAPSULE | Freq: Every day | ORAL | Status: DC
  Administered 2020-02-05 – 2020-02-07 (×3): 0.4 mg via ORAL
  Filled 2020-02-05 (×3): qty 1

## 2020-02-05 MED ORDER — VITAMIN K1 10 MG/ML IJ SOLN
5.0000 mg | Freq: Once | INTRAVENOUS | Status: AC
  Administered 2020-02-05: 5 mg via INTRAVENOUS
  Filled 2020-02-05: qty 0.5

## 2020-02-05 MED ORDER — ACETAMINOPHEN 325 MG PO TABS
650.0000 mg | ORAL_TABLET | Freq: Once | ORAL | Status: AC
  Administered 2020-02-05: 650 mg via ORAL
  Filled 2020-02-05: qty 2

## 2020-02-05 MED ORDER — SODIUM CHLORIDE 0.9% FLUSH
3.0000 mL | Freq: Two times a day (BID) | INTRAVENOUS | Status: DC
  Administered 2020-02-05 – 2020-02-07 (×4): 3 mL via INTRAVENOUS

## 2020-02-05 MED ORDER — FAMOTIDINE 20 MG PO TABS
20.0000 mg | ORAL_TABLET | Freq: Every day | ORAL | Status: DC
  Administered 2020-02-05 – 2020-02-07 (×3): 20 mg via ORAL
  Filled 2020-02-05 (×3): qty 1

## 2020-02-05 MED ORDER — SODIUM CHLORIDE 0.9 % IV SOLN: 250.0000 mL | INTRAVENOUS | Status: DC | PRN

## 2020-02-05 MED ORDER — LEVALBUTEROL HCL 0.63 MG/3ML IN NEBU: 0.6300 mg | INHALATION_SOLUTION | Freq: Four times a day (QID) | RESPIRATORY_TRACT | Status: DC | PRN

## 2020-02-05 MED ORDER — HYDROCODONE-ACETAMINOPHEN 5-325 MG PO TABS
1.0000 | ORAL_TABLET | ORAL | Status: DC | PRN
  Administered 2020-02-05 – 2020-02-07 (×9): 1 via ORAL
  Filled 2020-02-05 (×8): qty 1

## 2020-02-05 MED ORDER — ONDANSETRON HCL 4 MG/2ML IJ SOLN: 4.0000 mg | Freq: Four times a day (QID) | INTRAMUSCULAR | Status: DC | PRN

## 2020-02-05 MED ORDER — FERRIC CITRATE 1 GM 210 MG(FE) PO TABS
210.0000 mg | ORAL_TABLET | Freq: Three times a day (TID) | ORAL | Status: DC
  Administered 2020-02-06 – 2020-02-07 (×2): 210 mg via ORAL
  Filled 2020-02-05 (×9): qty 1

## 2020-02-05 MED ORDER — LABETALOL HCL 5 MG/ML IV SOLN: 10.0000 mg | INTRAVENOUS | Status: DC | PRN

## 2020-02-05 MED ORDER — SODIUM CHLORIDE 0.9% IV SOLUTION: Freq: Once | INTRAVENOUS | Status: DC

## 2020-02-05 MED ORDER — SODIUM CHLORIDE 0.9% FLUSH: 3.0000 mL | INTRAVENOUS | Status: DC | PRN

## 2020-02-05 MED ORDER — FINASTERIDE 5 MG PO TABS
5.0000 mg | ORAL_TABLET | Freq: Every day | ORAL | Status: DC
  Administered 2020-02-05 – 2020-02-07 (×3): 5 mg via ORAL
  Filled 2020-02-05 (×5): qty 1

## 2020-02-05 MED ORDER — ACETAMINOPHEN 650 MG RE SUPP: 650.0000 mg | Freq: Four times a day (QID) | RECTAL | Status: DC | PRN

## 2020-02-05 NOTE — ED Provider Notes (Signed)
[facility] EMERGENCY DEPARTMENT Provider Note   CSN: [id number] Arrival date & time: 02/05/20  0549     History No chief complaint on file.   Barry Lawrence is a 74 y.o. male.  Patient states that he went to the dentist yesterday and when he was coming home around 3 or so he noticed his right arm was weak.  Today he also noticed that his left arm was weak.  No other complaints.  Patient is a dialysis patient  The history is provided by the patient and medical records. No language interpreter was used.  Weakness Severity:  Moderate Onset quality:  Sudden Timing:  Constant Progression:  Worsening Chronicity:  New Context: not alcohol use   Relieved by:  Nothing Worsened by:  Nothing Ineffective treatments:  None tried Associated symptoms: no abdominal pain, no chest pain, no cough, no diarrhea, no frequency, no headaches and no seizures        Past Medical History:  Diagnosis Date  . Abdominal aortic aneurysm ([place]) 01/28/10; 02/05/14   4.3x4.6cm;  now measuring 5.5 cm, status post aortobifemoral bypass grafting in January 2016 by Dr. problem along with left renal artery bypass  . Arthritis    in his back  . CAD (coronary artery disease)    myoview 04/21/11-normal, EF49%  . Carotid stenosis 01/15/08   right endarterectomy-Dr. Amedeo Plenty  . CKD (chronic kidney disease), stage III   . Diabetes ([place])    BORDERLINE  . GERD (gastroesophageal reflux disease)   . Headache    uses [employer], states he is lessening what he is taking for prep. for surgery   . Hyperlipemia   . Hypertension    stress test- done 04/2014, followed by Dr. Gwenlyn Found  . Myocardial infarction ([place]) 1996  . OSA on CPAP    CPAP q night , CPAP is through [place], states doesn't remember when he had the last study  . PAD (peripheral artery disease) ([place]) 06/12/09   a. 11/22/07 PTA & stenting right external iliac artery;bilateral iliac & PTI and stenting 1997;right ICA =>50% reduction,right SFA >50%,left ICA at stent =>  50% reduction,left EIA => 50% reduction,left ATA occluded, left SFA mid > 49% reduction;  03/2014 Angio: LRA 90, patent L Iliac stent and distal RCIA stent, large saccular AAA.  [name] Pneumonia    hosp. as a child with pneumonia, not since   . Polycythemia    H/O  . S/P CABG (coronary artery bypass graft) 1996  . Tobacco abuse     Patient Active Problem List   Diagnosis Date Noted  . Hemorrhagic cerebrovascular accident (CVA) ([place]) 02/05/2020  . CKD (chronic kidney disease) stage 5, GFR less than 15 ml/min (HCC) 08/07/2018  . Pulmonary edema   . Systolic congestive heart failure ([place])   . Noncardiogenic pulmonary edema 08/06/2018  . Bradycardia, drug induced 08/05/2018  . SOB (shortness of breath) 08/04/2018  . Pleural effusion   . Parapneumonic effusion 12/12/2014  . Pneumonia due to Klebsiella pneumoniae ([place])   . Acute on chronic diastolic heart failure ([place])   . Sepsis ([place]) 11/30/2014  . CAP (community acquired pneumonia) 11/29/2014  . Chronic diastolic CHF (congestive heart failure) ([place]) 11/29/2014  . Cellulitis of left lower extremity 10/06/2014  . Solitary pulmonary nodule 09/04/2014  . Warfarin anticoagulation   . Hypokalemia 06/15/2014  . Wound infection 06/15/2014  . Encounter for central line placement   . HCAP (healthcare-associated pneumonia)   . AAA (abdominal aortic aneurysm) ([place]) 05/30/2014  .  Acute respiratory failure with hypoxia ([place])   . Post-operative state   . Respiratory failure ([place])   . Thoracic ascending aortic aneurysm ([place]) 03/31/2014  . CKD (chronic kidney disease), stage IV ([place])   . Sleep apnea   . Cigarette smoker 03/29/2014  . COPD GOLD II still smoking  03/26/2014  . Abdominal aortic aneurysm without rupture ([place]) 03/18/2014  . Hyperlipidemia 02/16/2013  . CAD (coronary artery disease) 07/15/2012  . GERD (gastroesophageal reflux disease) 07/15/2012  . Carotid artery disease   . Hypertensive heart disease   . PAD (peripheral artery  disease) ([place]) 06/12/2009    Past Surgical History:  Procedure Laterality Date  . ABDOMINAL AORTAGRAM N/A 04/01/2014   Procedure: ABDOMINAL Maxcine Ham;  Surgeon: [name], MD;  Location: [facility] CATH LAB;  Service: Cardiovascular;  Laterality: N/A;  . AORTA - BILATERAL FEMORAL ARTERY BYPASS GRAFT N/A 05/30/2014   Procedure: AORTOBIFEMORAL BYPASS GRAFT;  Surgeon: Serafina Mitchell, MD;  Location: [place];  Service: Vascular;  Laterality: N/A;  . AORTIC/RENAL BYPASS Left 05/30/2014   Procedure: LEFT RENAL ARTERY BYPASS;  Surgeon: Serafina Mitchell, MD;  Location: [place];  Service: Vascular;  Laterality: Left;  . BACK SURGERY  1988   at [facility]  . CAROTID ENDARTERECTOMY Right 01/15/08  . COLONOSCOPY N/A 07/09/2013   Procedure: COLONOSCOPY;  Surgeon: Juanita Craver, MD;  Location: [facility] ENDOSCOPY;  Service: Endoscopy;  Laterality: N/A;  . CORONARY ARTERY BYPASS GRAFT  1996   LIMA to LAD, sequential vein to OM1 and 2 as well as acure marginal branch and diag branch  . EMPYEMA DRAINAGE Right 12/17/2014   Procedure: EMPYEMA DRAINAGE;  Surgeon: Gaye Pollack, MD;  Location: [place];  Service: Thoracic;  Laterality: Right;  . [place] SURGERY  1998  . pv angio  11/22/2007   PTA/ stenting of right common iliac artery with a 10x79mm Smart stent and postdilated with a 7x66mmpowerflex balloon; high grade calcified stenosis of the RICA  . PV angio  04/01/14   AAA, Lt renal artery stenosis, patent iliacs  . VIDEO ASSISTED THORACOSCOPY (VATS)/DECORTICATION Right 12/17/2014   Procedure: RIGHT VIDEO ASSISTED THORACOSCOPY (VATS);  Surgeon: Gaye Pollack, MD;  Location: [facility] OR;  Service: Thoracic;  Laterality: Right;       Family History  Problem Relation Age of Onset  . Heart failure Father     Social History   Tobacco Use  . Smoking status: Current Every Day Smoker    Packs/day: 1.00    Years: 56.00    Pack years: 56.00    Types: Cigarettes  . Smokeless tobacco: Never Used  Vaping Use  . Vaping Use: Former    Substance Use Topics  . Alcohol use: No    Alcohol/week: 0.0 standard drinks  . Drug use: No    Home Medications Prior to Admission medications   Medication Sig Start Date End Date Taking? Authorizing Provider  albuterol (ACCUNEB) 0.63 MG/3ML nebulizer solution Take 1 ampule by nebulization every 6 (six) hours as needed for wheezing.   Yes [provider]  amLODipine (NORVASC) 5 MG tablet Take 5 mg by mouth See admin instructions. Take 5 mg on Mon, Wed, Fri, and Sunday.   Yes [provider]  atorvastatin (LIPITOR) 80 MG tablet Take 80 mg by mouth at bedtime.    Yes [provider]  famotidine (PEPCID) 20 MG tablet Take 20 mg by mouth daily.    Yes [provider]  ferric citrate (AURYXIA) 1 GM  210 MG(Fe) tablet Take 210 mg by mouth 3 (three) times daily with meals.   Yes [provider]  finasteride (PROSCAR) 5 MG tablet Take 5 mg by mouth daily.   Yes [provider]  furosemide (LASIX) 80 MG tablet Take 2 tablets (160 mg total) by mouth 2 (two) times daily. Patient taking differently: Take 160 mg by mouth See admin instructions. Take 160 mg twice daily on Mon,Wed, Fri and Sunday. 08/08/18  Yes Tat, Shanon Brow, MD  oxyCODONE (OXY IR/ROXICODONE) 5 MG immediate release tablet Take 5 mg by mouth every 4 (four) hours as needed for severe pain.   Yes [provider]  tamsulosin (FLOMAX) 0.4 MG CAPS capsule Take 0.4 mg by mouth in the morning and at bedtime.    Yes [provider]  warfarin (COUMADIN) 5 MG tablet Take 1 tablet daily except 1/2 tablet on Mondays, Wednesdays and Fridays or as directed Patient taking differently: Take 5 mg by mouth daily at 4 PM.  05/03/16  Yes [name], MD  clindamycin (CLEOCIN) 150 MG capsule Take 150 mg by mouth in the morning and at bedtime.    [provider]    Allergies    Carvedilol, Metolazone, and Niaspan [niacin er]  Review of Systems   Review of Systems   Constitutional: Negative for appetite change and fatigue.  HENT: Negative for congestion, ear discharge and sinus pressure.   Eyes: Negative for discharge.  Respiratory: Negative for cough.   Cardiovascular: Negative for chest pain.  Gastrointestinal: Negative for abdominal pain and diarrhea.  Genitourinary: Negative for frequency and hematuria.  Musculoskeletal: Negative for back pain.  Skin: Negative for rash.  Neurological: Positive for weakness. Negative for seizures and headaches.  Psychiatric/Behavioral: Negative for hallucinations.    Physical Exam Updated Vital Signs BP (!) 112/44   Pulse (!) 50   Temp 98.1 F (36.7 C)   Resp 14   Ht 5\' 7"  (1.702 m)   Wt 79.2 kg   SpO2 97%   BMI 27.33 kg/m   Physical Exam Vitals and nursing note reviewed.  Constitutional:      Appearance: He is well-developed.  HENT:     Head: Normocephalic.     Nose: Nose normal.  Eyes:     General: No scleral icterus.    Conjunctiva/sclera: Conjunctivae normal.  Neck:     Thyroid: No thyromegaly.  Cardiovascular:     Rate and Rhythm: Normal rate and regular rhythm.     Heart sounds: No murmur heard.  No friction rub. No gallop.   Pulmonary:     Breath sounds: No stridor. No wheezing or rales.  Chest:     Chest wall: No tenderness.  Abdominal:     General: There is no distension.     Tenderness: There is no abdominal tenderness. There is no rebound.  Musculoskeletal:        General: No swelling.     Cervical back: Neck supple.  Lymphadenopathy:     Cervical: No cervical adenopathy.  Skin:    Findings: No erythema or rash.  Neurological:     Mental Status: He is alert and oriented to person, place, and time.     Motor: No abnormal muscle tone.     Coordination: Coordination normal.     Comments: Patient has poor coordination in his right arm and right leg  Psychiatric:        Behavior: Behavior normal.     ED Results / Procedures / Treatments  Labs (all labs ordered are  listed, but only abnormal results are displayed) Labs Reviewed  PROTIME-INR - Abnormal; Notable for the following components:      Result Value   Prothrombin Time 32.0 (*)    INR 3.2 (*)    All other components within normal limits  APTT - Abnormal; Notable for the following components:   aPTT 55 (*)    All other components within normal limits  CBC - Abnormal; Notable for the following components:   RBC 4.18 (*)    MCV 100.5 (*)    RDW 16.3 (*)    All other components within normal limits  COMPREHENSIVE METABOLIC PANEL - Abnormal; Notable for the following components:   Glucose, Bld 100 (*)    BUN 51 (*)    Creatinine, Ser 4.83 (*)    Total Protein 6.4 (*)    AST 13 (*)    GFR calc non Af Amer 11 (*)    GFR calc Af Amer 13 (*)    All other components within normal limits  SARS CORONAVIRUS 2 BY RT PCR (HOSPITAL ORDER, [place] [facility])  ETHANOL  DIFFERENTIAL  RAPID URINE DRUG SCREEN, HOSP PERFORMED  URINALYSIS, ROUTINE W REFLEX MICROSCOPIC  I-STAT CHEM 8, ED  PREPARE FRESH FROZEN PLASMA    EKG EKG Interpretation  Date/Time:  Tuesday February 05 2020 05:55:48 EDT Ventricular Rate:  65 PR Interval:    QRS Duration: 130 QT Interval:  388 QTC Calculation: 404 R Axis:   41 Text Interpretation: Atrial fibrillation Right bundle branch block Nonspecific T abnormalities, lateral leads Since last tracing 13 Jan 2020 Premature ventricular complexes are no longer present Confirmed by Rolland Porter [phone]) on 02/05/2020 6:52:47 AM   Radiology MR ANGIO HEAD WO CONTRAST  Result Date: 02/05/2020 CLINICAL DATA:  Stroke.  Right-sided weakness. EXAM: MRI HEAD WITHOUT CONTRAST MRA HEAD WITHOUT CONTRAST TECHNIQUE: Multiplanar, multiecho pulse sequences of the brain and surrounding structures were obtained without intravenous contrast. Angiographic images of the head were obtained using MRA technique without contrast. COMPARISON:  CT head 02/05/2020 FINDINGS: MRI HEAD  FINDINGS Brain: Small acute infarct posterior limb internal capsule with associated small amount of hemorrhage. This corresponds to the area on CT. No other areas of acute infarct. Scattered periventricular white matter hyperintensities bilaterally compatible with chronic microvascular ischemia. Negative for mass or hydrocephalus. Vascular: Normal arterial flow voids. Skull and upper cervical spine: Negative Sinuses/Orbits: Paranasal sinuses clear.  Negative orbit Other: Motion degraded study MRA HEAD FINDINGS Motion degraded study. Right vertebral dominant and patent to the basilar. Right PICA patent. Left vertebral artery ends in PICA. Basilar patent without stenosis. Fetal origin of the posterior cerebral artery bilaterally. Moderate stenosis left P2 segment. Right posterior cerebral artery patent. Internal carotid artery patent bilaterally without stenosis. Anterior and middle cerebral arteries widely patent bilaterally. IMPRESSION: 1. Small acute hemorrhagic infarct posterior limb internal capsule, corresponding to the abnormality on CT. No other acute infarct 2. Chronic microvascular ischemic change in the white matter 3. MRA degraded by motion. Moderate stenosis left P2 segment. No intracranial large vessel occlusion. Electronically Signed   By: Franchot Gallo M.D.   On: 02/05/2020 12:10   MR BRAIN WO CONTRAST  Result Date: 02/05/2020 CLINICAL DATA:  Stroke.  Right-sided weakness. EXAM: MRI HEAD WITHOUT CONTRAST MRA HEAD WITHOUT CONTRAST TECHNIQUE: Multiplanar, multiecho pulse sequences of the brain and surrounding structures were obtained without intravenous contrast. Angiographic images of the head were obtained using MRA technique without contrast.  COMPARISON:  CT head 02/05/2020 FINDINGS: MRI HEAD FINDINGS Brain: Small acute infarct posterior limb internal capsule with associated small amount of hemorrhage. This corresponds to the area on CT. No other areas of acute infarct. Scattered periventricular  white matter hyperintensities bilaterally compatible with chronic microvascular ischemia. Negative for mass or hydrocephalus. Vascular: Normal arterial flow voids. Skull and upper cervical spine: Negative Sinuses/Orbits: Paranasal sinuses clear.  Negative orbit Other: Motion degraded study MRA HEAD FINDINGS Motion degraded study. Right vertebral dominant and patent to the basilar. Right PICA patent. Left vertebral artery ends in PICA. Basilar patent without stenosis. Fetal origin of the posterior cerebral artery bilaterally. Moderate stenosis left P2 segment. Right posterior cerebral artery patent. Internal carotid artery patent bilaterally without stenosis. Anterior and middle cerebral arteries widely patent bilaterally. IMPRESSION: 1. Small acute hemorrhagic infarct posterior limb internal capsule, corresponding to the abnormality on CT. No other acute infarct 2. Chronic microvascular ischemic change in the white matter 3. MRA degraded by motion. Moderate stenosis left P2 segment. No intracranial large vessel occlusion. Electronically Signed   By: Franchot Gallo M.D.   On: 02/05/2020 12:10   CT HEAD CODE STROKE WO CONTRAST  Result Date: 02/05/2020 CLINICAL DATA:  Code stroke.  Right arm and leg weakness EXAM: CT HEAD WITHOUT CONTRAST TECHNIQUE: Contiguous axial images were obtained from the base of the skull through the vertex without intravenous contrast. COMPARISON:  None. FINDINGS: Brain: Age related volume loss. Mild moderate chronic small-vessel change of the hemispheric white matter. 4-5 mm hyperdensity (low level) in the posterior limb internal capsule on the left that could represent a tiny recent hemorrhagic infarction. No sign of acute large vessel territory insult at this time. No mass, hydrocephalus or extra-axial collection. Vascular: There is atherosclerotic calcification of the major vessels at the base of the brain. Skull: Negative Sinuses/Orbits: Clear/normal Other: None ASPECTS ([place]  Stroke Program Early CT Score) - Ganglionic level infarction (caudate, lentiform nuclei, internal capsule, insula, M1-M3 cortex): 6 - Supraganglionic infarction (M4-M6 cortex): 3 Total score (0-10 with 10 being normal): 9 IMPRESSION: 1. 4-5 mm hyperdensity in the posterior limb internal capsule on the left that could represent a tiny recent hemorrhagic infarction. No sign of acute large vessel territory insult at this time. 2. ASPECTS is 9. 3. These results were called by telephone at the time of interpretation on 02/05/2020 at 8:41 am to provider [facility] Girlie Veltri , who verbally acknowledged these results. Electronically Signed   By: Nelson Chimes M.D.   On: 02/05/2020 08:43    Procedures Procedures (including critical care time)  Medications Ordered in ED Medications  phytonadione (VITAMIN K) 5 mg in dextrose 5 % 50 mL IVPB (has no administration in time range)  0.9 %  sodium chloride infusion (Manually program via Guardrails IV Fluids) (has no administration in time range)  acetaminophen (TYLENOL) tablet 650 mg (650 mg Oral Given 02/05/20 1124)    ED Course  I have reviewed the triage vital signs and the nursing notes.  Pertinent labs & imaging results that were available during my care of the patient were reviewed by me and considered in my medical decision making (see chart for details). CRITICAL CARE Performed by: Milton Ferguson Total critical care time: 45 minutes Critical care time was exclusive of separately billable procedures and treating other patients. Critical care was necessary to treat or prevent imminent or life-threatening deterioration. Critical care was time spent personally by me on the following activities: development of treatment plan with patient and/or surrogate  as well as nursing, discussions with consultants, evaluation of patient's response to treatment, examination of patient, obtaining history from patient or surrogate, ordering and performing treatments and  interventions, ordering and review of laboratory studies, ordering and review of radiographic studies, pulse oximetry and re-evaluation of patient's condition.   Neurology was consulted and stated that this most likely is a small hemorrhagic stroke.  We will get an MRI to confirm.  He will need to be admitted for observation and have his Coumadin held MDM Rules/Calculators/A&P                          Hemorrhagic stroke.  Patient will be admitted to hospitalist with neurology following       This patient presents to the ED for concern of weakness, this involves an extensive number of treatment options, and is a complaint that carries with it a high risk of complications and morbidity.  The differential diagnosis includes stroke   Lab Tests:   I Ordered, reviewed, and interpreted labs, which included CBC chemistries which shows the renal failure  Medicines ordered:   I ordered medication normal saline for dehydration  Imaging Studies ordered:   I ordered imaging studies which included CT head and MRI  I independently visualized and interpreted imaging which showed small hemorrhagic stroke  Additional history obtained:   Additional history obtained from records  Previous records obtained and reviewed.  Consultations Obtained:   I consulted neurology and hospitalist and discussed lab and imaging findings  Reevaluation:  After the interventions stated above, I reevaluated the patient and found unchanged  Critical Interventions:  .   Final Clinical Impression(s) / ED Diagnoses Final diagnoses:  None    Rx / DC Orders ED Discharge Orders    None       Milton Ferguson, MD 02/05/20 1336

## 2020-02-05 NOTE — ED Triage Notes (Signed)
Pt from home via [facility] for thinking he had a stroke. Pt has hx of aneurysms (AAA) and DVT,.  States he has swelling on the right side of his face. Also says that the vein on the side of his head isn't always as big.  Pt also states that he feels like he is leaning to the left some.  Alert and oriented x4 .  Abdominal hernia x5 years that he doesn't want fixed yet.  Noticed symptoms 3pm 9/13 Due for dialysis today.

## 2020-02-05 NOTE — Consult Note (Signed)
[facility] Tele[facility]urology Consult Services  Stat Consult  Date of Service:   02/05/2020 07:43:12  Impression:     .  I63.00 - Cerebrovascular accident (CVA) due to thrombosis of precerebral artery (HCCC)  Comments/Sign-Out: He appears to have a small lacunar infarct in the posterior limb of the left internal capsule with some slight petechial hemorrhage in the area. It seems to fit his clinical picture. I recommend better blood pressure control, stopping his warfarin for a few days and then restarting after a repeat CT shows resolution of the small area of bleeding. Incidentally if his jaw pain doesn't improve I would suggest that he has trigeminal neuralgia and should be treated for this separately.  CT HEAD: Reviewed Round area of increase density in the posterior limb of the left internal capsule suggesting lacunar infarct with possible petechial hemorrhage  Metrics: [facility] Notification Time: 02/05/2020 07:41:20 Stamp Time: 02/05/2020 07:43:12 Callback Response Time: 02/05/2020 07:44:28  Our recommendations are outlined below.  Recommendations:     .  As above   Imaging Studies:     .  MRI Head  Therapies:     .  Physical Therapy, Occupational Therapy, Speech Therapy Assessment When Applicable  Disposition: Neurology Follow Up Recommended  Sign Out:     .  Discussed with Emergency Department Provider  ----------------------------------------------------------------------------------------------------  Chief Complaint: Sudden loss of coordination right side  History of Present Illness: Patient is a 74 year old Male.  This 74 year old man who is a dialysis patient is been going to the dentist for several weeks because of pain in the right job. He had an extraction which only helps a little bit. Panel he doesn't have an abscess and describe shock like pains in the area. While he was at the dentist yesterday he suddenly began to have an  coordination of his right arm and leg. He describes reaching for something and not being able to reach directly to it. There was no weakness or numbness. He had no loss of vision or speech.    Past Medical History:     . Hypertension     . Diabetes Mellitus     . Hyperlipidemia     . Atrial Fibrillation     . Coronary Artery Disease     . There is NO history of Stroke  Anticoagulant use:  Warfarin  Antiplatelet use: No     Examination: BP(162/114), Pulse(68), Blood Glucose(Not available) 1A: Level of Consciousness - Alert; keenly responsive + 0 1B: Ask Month and Age - Both Questions Right + 0 1C: Blink Eyes & Squeeze Hands - Performs Both Tasks + 0 2: Test Horizontal Extraocular Movements - Normal + 0 3: Test Visual Fields - No Visual Loss + 0 4: Test Facial Palsy (Use Grimace if Obtunded) - Normal symmetry + 0 5A: Test Left Arm Motor Drift - No Drift for 10 Seconds + 0 5B: Test Right Arm Motor Drift - No Drift for 10 Seconds + 0 6A: Test Left Leg Motor Drift - No Drift for 5 Seconds + 0 6B: Test Right Leg Motor Drift - Drift, but doesn't hit bed + 1 7: Test Limb Ataxia (FNF/Heel-Shin) - Ataxia in 1 Limb + 1 8: Test Sensation - Normal; No sensory loss + 0 9: Test Language/Aphasia - Normal; No aphasia + 0 10: Test Dysarthria - Normal + 0 11: Test Extinction/Inattention - No abnormality + 0  NIHSS Score: 2    Patient is being evaluated for possible acute neurologic impairment  and high probability of imminent or life-threatening deterioration. I spent total of 25 minutes providing care to this patient, including time for face to face visit via telemedicine, review of medical records, imaging studies and discussion of findings with providers, the patient and/or family.   Dr Cindie Laroche   [facility] [phone]  Case [id number]

## 2020-02-05 NOTE — H&P (Addendum)
History and Physical    Barry Lawrence GUR:[id number] DOB: Sep 06, 1945 DOA: 02/05/2020  PCP: Blane Ohara, MD   Patient coming from: Home  Chief Complaint: R arm weakness  HPI: Barry Lawrence is a 74 y.o. male with medical history significant for ESRD on HD TTS, AAA, CAD, PAD, BPH, hypertension, dyslipidemia, COPD with active tobacco use, and atrial fibrillation on warfarin who presented to the ED with complaints of right-sided weakness that began shortly after dental appointment yesterday after he had a tooth extraction.  He states that he was not significantly bothered by this yesterday, but decided to come to the ED today for further evaluation as he was not getting any better.  He denies any headache, speech deficit, visual deficits, dizziness, or other symptoms.  No chest pain, shortness of breath, abdominal pain, nausea, vomiting, fevers, chills, numbness, or tingling noted.  Patient's last dialysis session was on 9/11 and he was due for dialysis today, but has not received it.   ED Course: Stable vital signs noted and patient is afebrile.  Laboratory data with INR of 3.2 noted.  Initial CT head imaging with questionable hemorrhagic CVA 4-5 mm in size near the posterior limb of the internal capsule noted.  Telemetry neurology was consulted by EDP who had recommended brain MRI.  Hemorrhagic CVA subsequently confirmed on brain MRI.  I have discussed the case with Dr. Merlene Laughter who has recommended vitamin K and FFP for urgent reversal of INR with recommendation for transfer to [facility] for neurology work-up and evaluation.  I subsequently discussed the case with Dr. Leonel Ramsay at [facility] who agrees to see the patient upon transfer to progressive floor.  I have also discussed the case with nephrology Dr. Joylene Grapes who will plan to see the patient at [facility] with plans for dialysis likely by 9/15.  Review of Systems: All others reviewed and otherwise negative.  Past Medical History:    Diagnosis Date  . Abdominal aortic aneurysm ([place]) 01/28/10; 02/05/14   4.3x4.6cm;  now measuring 5.5 cm, status post aortobifemoral bypass grafting in January 2016 by Dr. problem along with left renal artery bypass  . Arthritis    in his back  . CAD (coronary artery disease)    myoview 04/21/11-normal, EF49%  . Carotid stenosis 01/15/08   right endarterectomy-Dr. Amedeo Plenty  . CKD (chronic kidney disease), stage III   . Diabetes ([place])    BORDERLINE  . GERD (gastroesophageal reflux disease)   . Headache    uses [employer], states he is lessening what he is taking for prep. for surgery   . Hyperlipemia   . Hypertension    stress test- done 04/2014, followed by Dr. Gwenlyn Found  . Myocardial infarction ([place]) 1996  . OSA on CPAP    CPAP q night , CPAP is through [place], states doesn't remember when he had the last study  . PAD (peripheral artery disease) ([place]) 06/12/09   a. 11/22/07 PTA & stenting right external iliac artery;bilateral iliac & PTI and stenting 1997;right ICA =>50% reduction,right SFA >50%,left ICA at stent => 50% reduction,left EIA => 50% reduction,left ATA occluded, left SFA mid > 49% reduction;  03/2014 Angio: LRA 90, patent L Iliac stent and distal RCIA stent, large saccular AAA.  [name] Pneumonia    hosp. as a child with pneumonia, not since   . Polycythemia    H/O  . S/P CABG (coronary artery bypass graft) 1996  . Tobacco abuse     Past Surgical History:  Procedure Laterality Date  . ABDOMINAL AORTAGRAM N/A 04/01/2014   Procedure: ABDOMINAL Maxcine Ham;  Surgeon: [name], MD;  Location: [facility] CATH LAB;  Service: Cardiovascular;  Laterality: N/A;  . AORTA - BILATERAL FEMORAL ARTERY BYPASS GRAFT N/A 05/30/2014   Procedure: AORTOBIFEMORAL BYPASS GRAFT;  Surgeon: Serafina Mitchell, MD;  Location: [place];  Service: Vascular;  Laterality: N/A;  . AORTIC/RENAL BYPASS Left 05/30/2014   Procedure: LEFT RENAL ARTERY BYPASS;  Surgeon: Serafina Mitchell, MD;  Location: [place];  Service: Vascular;   Laterality: Left;  . BACK SURGERY  1988   at [facility]  . CAROTID ENDARTERECTOMY Right 01/15/08  . COLONOSCOPY N/A 07/09/2013   Procedure: COLONOSCOPY;  Surgeon: Juanita Craver, MD;  Location: [facility] ENDOSCOPY;  Service: Endoscopy;  Laterality: N/A;  . CORONARY ARTERY BYPASS GRAFT  1996   LIMA to LAD, sequential vein to OM1 and 2 as well as acure marginal branch and diag branch  . EMPYEMA DRAINAGE Right 12/17/2014   Procedure: EMPYEMA DRAINAGE;  Surgeon: Gaye Pollack, MD;  Location: [place];  Service: Thoracic;  Laterality: Right;  . [place] SURGERY  1998  . pv angio  11/22/2007   PTA/ stenting of right common iliac artery with a 10x47mm Smart stent and postdilated with a 7x35mmpowerflex balloon; high grade calcified stenosis of the RICA  . PV angio  04/01/14   AAA, Lt renal artery stenosis, patent iliacs  . VIDEO ASSISTED THORACOSCOPY (VATS)/DECORTICATION Right 12/17/2014   Procedure: RIGHT VIDEO ASSISTED THORACOSCOPY (VATS);  Surgeon: Gaye Pollack, MD;  Location[facility];  Service: Thoracic;  Laterality: Right;     reports that he has been smoking cigarettes. He has a 56.00 pack-year smoking history. He has never used smokeless tobacco. He reports that he does not drink alcohol and does not use drugs.  Allergies  Allergen Reactions  . Carvedilol Other (See Comments)    unknown  . Metolazone Other (See Comments)    Pt says had MI when took  . Niaspan [Niacin Er] Hives    Family History  Problem Relation Age of Onset  . Heart failure Father     Prior to Admission medications   Medication Sig Start Date End Date Taking? Authorizing Provider  albuterol (ACCUNEB) 0.63 MG/3ML nebulizer solution Take 1 ampule by nebulization every 6 (six) hours as needed for wheezing.   Yes [provider]  amLODipine (NORVASC) 5 MG tablet Take 5 mg by mouth See admin instructions. Take 5 mg on Mon, Wed, Fri, and Sunday.   Yes [provider]  atorvastatin (LIPITOR) 80 MG tablet Take 80 mg by  mouth at bedtime.    Yes [provider]  famotidine (PEPCID) 20 MG tablet Take 20 mg by mouth daily.    Yes [provider]  ferric citrate (AURYXIA) 1 GM 210 MG(Fe) tablet Take 210 mg by mouth 3 (three) times daily with meals.   Yes [provider]  finasteride (PROSCAR) 5 MG tablet Take 5 mg by mouth daily.   Yes [provider]  furosemide (LASIX) 80 MG tablet Take 2 tablets (160 mg total) by mouth 2 (two) times daily. Patient taking differently: Take 160 mg by mouth See admin instructions. Take 160 mg twice daily on Mon,Wed, Fri and Sunday. 08/08/18  Yes Tat, Shanon Brow, MD  oxyCODONE (OXY IR/ROXICODONE) 5 MG immediate release tablet Take 5 mg by mouth every 4 (four) hours as needed for severe pain.   Yes [provider]  tamsulosin [facility])  0.4 MG CAPS capsule Take 0.4 mg by mouth in the morning and at bedtime.    Yes [provider]  warfarin (COUMADIN) 5 MG tablet Take 1 tablet daily except 1/2 tablet on Mondays, Wednesdays and Fridays or as directed Patient taking differently: Take 5 mg by mouth daily at 4 PM.  05/03/16  Yes [name], MD  clindamycin (CLEOCIN) 150 MG capsule Take 150 mg by mouth in the morning and at bedtime.    [provider]    Physical Exam: Vitals:   02/05/20 0555 02/05/20 0920 02/05/20 1215 02/05/20 1230  BP:  (!) 160/68 (!) 116/50 (!) 112/44  Pulse: 68 65 67 (!) 50  Resp: (!) 22 (!) 22 (!) 22 14  Temp: 98.1 F (36.7 C)     SpO2: 95% 96% 96% 97%  Weight:      Height:        Constitutional: NAD, calm, comfortable Vitals:   02/05/20 0555 02/05/20 0920 02/05/20 1215 02/05/20 1230  BP:  (!) 160/68 (!) 116/50 (!) 112/44  Pulse: 68 65 67 (!) 50  Resp: (!) 22 (!) 22 (!) 22 14  Temp: 98.1 F (36.7 C)     SpO2: 95% 96% 96% 97%  Weight:      Height:       Eyes: lids and conjunctivae normal ENMT: Mucous membranes are moist.  Neck: normal, supple Respiratory: clear to auscultation  bilaterally. Normal respiratory effort. No accessory muscle use.  Cardiovascular: Regular rate and rhythm, no murmurs. No extremity edema. Abdomen: no tenderness, no distention. Bowel sounds positive.  Musculoskeletal:  No joint deformity upper and lower extremities.  Left-sided grip strength weaker than right.  Left foot dorsiflexion weaker than right.  No sensory deficits identified on gross examination.  Cranial nerves II through XII grossly intact. Skin: no rashes, lesions, ulcers.  Psychiatric: Normal judgment and insight. Alert and oriented x 3. Normal mood.   Labs on Admission: I have personally reviewed following labs and imaging studies  CBC: Recent Labs  Lab 02/05/20 0839  WBC 9.5  NEUTROABS 7.4  HGB 13.1  HCT 42.0  MCV 100.5*  PLT 448   Basic Metabolic Panel: Recent Labs  Lab 02/05/20 0839  NA 135  K 4.5  CL 99  CO2 25  GLUCOSE 100*  BUN 51*  CREATININE 4.83*  CALCIUM 9.1   GFR: Estimated Creatinine Clearance: 12.7 mL/min (A) (by C-G formula based on SCr of 4.83 mg/dL (H)). Liver Function Tests: Recent Labs  Lab 02/05/20 0839  AST 13*  ALT 14  ALKPHOS 91  BILITOT 0.4  PROT 6.4*  ALBUMIN 3.5   No results for input(s): LIPASE, AMYLASE in the last 168 hours. No results for input(s): AMMONIA in the last 168 hours. Coagulation Profile: Recent Labs  Lab 02/05/20 0839  INR 3.2*   Cardiac Enzymes: No results for input(s): CKTOTAL, CKMB, CKMBINDEX, TROPONINI in the last 168 hours. BNP (last 3 results) No results for input(s): PROBNP in the last 8760 hours. HbA1C: No results for input(s): HGBA1C in the last 72 hours. CBG: No results for input(s): GLUCAP in the last 168 hours. Lipid Profile: No results for input(s): CHOL, HDL, LDLCALC, TRIG, CHOLHDL, LDLDIRECT in the last 72 hours. Thyroid Function Tests: No results for input(s): TSH, T4TOTAL, FREET4, T3FREE, THYROIDAB in the last 72 hours. Anemia Panel: No results for input(s): VITAMINB12, FOLATE,  FERRITIN, TIBC, IRON, RETICCTPCT in the last 72 hours. Urine analysis:    Component Value Date/Time  COLORURINE YELLOW 03/02/2019 1930   APPEARANCEUR HAZY (A) 03/02/2019 1930   LABSPEC 1.011 03/02/2019 1930   PHURINE 5.0 03/02/2019 1930   GLUCOSEU NEGATIVE 03/02/2019 1930   HGBUR NEGATIVE 03/02/2019 1930   BILIRUBINUR NEGATIVE 03/02/2019 1930   KETONESUR NEGATIVE 03/02/2019 1930   PROTEINUR 100 (A) 03/02/2019 1930   UROBILINOGEN 0.2 11/29/2014 1025   NITRITE NEGATIVE 03/02/2019 1930   LEUKOCYTESUR LARGE (A) 03/02/2019 1930    Radiological Exams on Admission: MR ANGIO HEAD WO CONTRAST  Result Date: 02/05/2020 CLINICAL DATA:  Stroke.  Right-sided weakness. EXAM: MRI HEAD WITHOUT CONTRAST MRA HEAD WITHOUT CONTRAST TECHNIQUE: Multiplanar, multiecho pulse sequences of the brain and surrounding structures were obtained without intravenous contrast. Angiographic images of the head were obtained using MRA technique without contrast. COMPARISON:  CT head 02/05/2020 FINDINGS: MRI HEAD FINDINGS Brain: Small acute infarct posterior limb internal capsule with associated small amount of hemorrhage. This corresponds to the area on CT. No other areas of acute infarct. Scattered periventricular white matter hyperintensities bilaterally compatible with chronic microvascular ischemia. Negative for mass or hydrocephalus. Vascular: Normal arterial flow voids. Skull and upper cervical spine: Negative Sinuses/Orbits: Paranasal sinuses clear.  Negative orbit Other: Motion degraded study MRA HEAD FINDINGS Motion degraded study. Right vertebral dominant and patent to the basilar. Right PICA patent. Left vertebral artery ends in PICA. Basilar patent without stenosis. Fetal origin of the posterior cerebral artery bilaterally. Moderate stenosis left P2 segment. Right posterior cerebral artery patent. Internal carotid artery patent bilaterally without stenosis. Anterior and middle cerebral arteries widely patent  bilaterally. IMPRESSION: 1. Small acute hemorrhagic infarct posterior limb internal capsule, corresponding to the abnormality on CT. No other acute infarct 2. Chronic microvascular ischemic change in the white matter 3. MRA degraded by motion. Moderate stenosis left P2 segment. No intracranial large vessel occlusion. Electronically Signed   By: Franchot Gallo M.D.   On: 02/05/2020 12:10   MR BRAIN WO CONTRAST  Result Date: 02/05/2020 CLINICAL DATA:  Stroke.  Right-sided weakness. EXAM: MRI HEAD WITHOUT CONTRAST MRA HEAD WITHOUT CONTRAST TECHNIQUE: Multiplanar, multiecho pulse sequences of the brain and surrounding structures were obtained without intravenous contrast. Angiographic images of the head were obtained using MRA technique without contrast. COMPARISON:  CT head 02/05/2020 FINDINGS: MRI HEAD FINDINGS Brain: Small acute infarct posterior limb internal capsule with associated small amount of hemorrhage. This corresponds to the area on CT. No other areas of acute infarct. Scattered periventricular white matter hyperintensities bilaterally compatible with chronic microvascular ischemia. Negative for mass or hydrocephalus. Vascular: Normal arterial flow voids. Skull and upper cervical spine: Negative Sinuses/Orbits: Paranasal sinuses clear.  Negative orbit Other: Motion degraded study MRA HEAD FINDINGS Motion degraded study. Right vertebral dominant and patent to the basilar. Right PICA patent. Left vertebral artery ends in PICA. Basilar patent without stenosis. Fetal origin of the posterior cerebral artery bilaterally. Moderate stenosis left P2 segment. Right posterior cerebral artery patent. Internal carotid artery patent bilaterally without stenosis. Anterior and middle cerebral arteries widely patent bilaterally. IMPRESSION: 1. Small acute hemorrhagic infarct posterior limb internal capsule, corresponding to the abnormality on CT. No other acute infarct 2. Chronic microvascular ischemic change in the  white matter 3. MRA degraded by motion. Moderate stenosis left P2 segment. No intracranial large vessel occlusion. Electronically Signed   By: Franchot Gallo M.D.   On: 02/05/2020 12:10   CT HEAD CODE STROKE WO CONTRAST  Result Date: 02/05/2020 CLINICAL DATA:  Code stroke.  Right arm and leg weakness EXAM: CT HEAD WITHOUT  CONTRAST TECHNIQUE: Contiguous axial images were obtained from the base of the skull through the vertex without intravenous contrast. COMPARISON:  None. FINDINGS: Brain: Age related volume loss. Mild moderate chronic small-vessel change of the hemispheric white matter. 4-5 mm hyperdensity (low level) in the posterior limb internal capsule on the left that could represent a tiny recent hemorrhagic infarction. No sign of acute large vessel territory insult at this time. No mass, hydrocephalus or extra-axial collection. Vascular: There is atherosclerotic calcification of the major vessels at the base of the brain. Skull: Negative Sinuses/Orbits: Clear/normal Other: None ASPECTS ([place] Stroke Program Early CT Score) - Ganglionic level infarction (caudate, lentiform nuclei, internal capsule, insula, M1-M3 cortex): 6 - Supraganglionic infarction (M4-M6 cortex): 3 Total score (0-10 with 10 being normal): 9 IMPRESSION: 1. 4-5 mm hyperdensity in the posterior limb internal capsule on the left that could represent a tiny recent hemorrhagic infarction. No sign of acute large vessel territory insult at this time. 2. ASPECTS is 9. 3. These results were called by telephone at the time of interpretation on 02/05/2020 at 8:41 am to provider [facility] ZAMMIT , who verbally acknowledged these results. Electronically Signed   By: Nelson Chimes M.D.   On: 02/05/2020 08:43    EKG: Independently reviewed. Afib 65bpm; RBBB.  Assessment/Plan Active Problems:   Hemorrhagic cerebrovascular accident (CVA) ([place])    Acute hemorrhagic CVA -Noted on head CT and MRI to posterior limb of internal capsule -INR  currently 3.2, per discussion with Dr. Merlene Laughter will administer vitamin K and FFP urgently for warfarin reversal -Check hemoglobin A1c and fasting lipid profile -Labetalol IV prn sbp>180, dbp>100 -Avoid aspirin or heparin agents -PT/OT evaluation -Transfer to [facility] progressive floor with further recommendations per neurology  History of atrial fibrillation on warfarin -Continue telemetry monitoring -Avoiding warfarin secondary to above  ESRD on HD TTS -Last hemodialysis session 9/11 -Discussed with nephrology who will plan to dialyze on 9/15 -Holding Lasix for now  CAD/PAD/hypertension/dyslipidemia -Continue statin -Holding anticoagulation for now -Hold amlodipine and lasix for now  BPH -Continue home medications  COPD with active tobacco abuse -No active bronchospasms currently noted -Xopenex will be prescribed as needed for any shortness of breath or wheezing -Counseled on smoking cessation -Nicotine patch will be provided  History of AAA -Vascular surgery planning operative management soon  DVT prophylaxis: SCDs Code Status: Full Family Communication: Discussed with wife on phone 9/14 Disposition Plan:Transfer to [facility] progressive for closer monitoring and Neurology evaluation Consults called:Neurology Dr. Merlene Laughter and Dr. Leonel Ramsay; Dr. Charlies Silvers Admission status: Inpatient, Progressive   [facility]  If 7PM-7AM, please contact night-coverage [email or web address]  02/05/2020, 1:28 PM

## 2020-02-05 NOTE — Progress Notes (Signed)
Pt has arrived via [facility] to the unit. Pt has all IV intact and tele connected. All belongings sent with the pt. All equipment has been sent with the pt. Pt does rate jaw pain a 6/10. Telephone and call light are within reach of the pt.

## 2020-02-06 ENCOUNTER — Inpatient Hospital Stay (HOSPITAL_COMMUNITY): Payer: No Typology Code available for payment source

## 2020-02-06 DIAGNOSIS — I340 Nonrheumatic mitral (valve) insufficiency: Secondary | ICD-10-CM

## 2020-02-06 DIAGNOSIS — I342 Nonrheumatic mitral (valve) stenosis: Secondary | ICD-10-CM

## 2020-02-06 DIAGNOSIS — I619 Nontraumatic intracerebral hemorrhage, unspecified: Secondary | ICD-10-CM

## 2020-02-06 DIAGNOSIS — G459 Transient cerebral ischemic attack, unspecified: Secondary | ICD-10-CM

## 2020-02-06 DIAGNOSIS — I351 Nonrheumatic aortic (valve) insufficiency: Secondary | ICD-10-CM

## 2020-02-06 DIAGNOSIS — I639 Cerebral infarction, unspecified: Secondary | ICD-10-CM

## 2020-02-06 DIAGNOSIS — I10 Essential (primary) hypertension: Secondary | ICD-10-CM

## 2020-02-06 LAB — ECHOCARDIOGRAM COMPLETE BUBBLE STUDY
AR max vel: 2.53 cm2
AV Area VTI: 2.33 cm2
AV Area mean vel: 2.27 cm2
AV Mean grad: 5 mmHg
AV Peak grad: 12.8 mmHg
Ao pk vel: 1.79 m/s
Area-P 1/2: 4.89 cm2
Calc EF: 60.8 %
P 1/2 time: 445 msec
S' Lateral: 3.4 cm
Single Plane A2C EF: 47.6 %
Single Plane A4C EF: 70.2 %

## 2020-02-06 LAB — PROTIME-INR
INR: 1.4 — ABNORMAL HIGH (ref 0.8–1.2)
Prothrombin Time: 16.6 seconds — ABNORMAL HIGH (ref 11.4–15.2)

## 2020-02-06 LAB — CBC
HCT: 36.3 % — ABNORMAL LOW (ref 39.0–52.0)
Hemoglobin: 11.4 g/dL — ABNORMAL LOW (ref 13.0–17.0)
MCH: 31.1 pg (ref 26.0–34.0)
MCHC: 31.4 g/dL (ref 30.0–36.0)
MCV: 98.9 fL (ref 80.0–100.0)
Platelets: 250 10*3/uL (ref 150–400)
RBC: 3.67 MIL/uL — ABNORMAL LOW (ref 4.22–5.81)
RDW: 16.2 % — ABNORMAL HIGH (ref 11.5–15.5)
WBC: 7.8 10*3/uL (ref 4.0–10.5)
nRBC: 0 % (ref 0.0–0.2)

## 2020-02-06 LAB — BASIC METABOLIC PANEL
Anion gap: 9 (ref 5–15)
BUN: 53 mg/dL — ABNORMAL HIGH (ref 8–23)
CO2: 26 mmol/L (ref 22–32)
Calcium: 9.5 mg/dL (ref 8.9–10.3)
Chloride: 98 mmol/L (ref 98–111)
Creatinine, Ser: 4.79 mg/dL — ABNORMAL HIGH (ref 0.61–1.24)
GFR calc Af Amer: 13 mL/min — ABNORMAL LOW (ref >60)
GFR calc non Af Amer: 11 mL/min — ABNORMAL LOW (ref >60)
Glucose, Bld: 107 mg/dL — ABNORMAL HIGH (ref 70–99)
Potassium: 4.8 mmol/L (ref 3.5–5.1)
Sodium: 133 mmol/L — ABNORMAL LOW (ref 135–145)

## 2020-02-06 LAB — BPAM FFP
Blood Product Expiration Date: 202109152359
ISSUE DATE / TIME: 202109142211
Unit Type and Rh: 6200

## 2020-02-06 LAB — PREPARE FRESH FROZEN PLASMA: Unit division: 0

## 2020-02-06 LAB — TYPE AND SCREEN
ABO/RH(D): A POS
Antibody Screen: NEGATIVE

## 2020-02-06 LAB — MAGNESIUM: Magnesium: 3.1 mg/dL — ABNORMAL HIGH (ref 1.7–2.4)

## 2020-02-06 LAB — PHOSPHORUS: Phosphorus: 4.1 mg/dL (ref 2.5–4.6)

## 2020-02-06 LAB — HEPATITIS B SURFACE ANTIGEN: Hepatitis B Surface Ag: NONREACTIVE

## 2020-02-06 MED ORDER — ACETAMINOPHEN 325 MG PO TABS
ORAL_TABLET | ORAL | Status: AC
  Filled 2020-02-06: qty 2

## 2020-02-06 MED ORDER — TOPIRAMATE 25 MG PO TABS
50.0000 mg | ORAL_TABLET | Freq: Two times a day (BID) | ORAL | Status: DC
  Administered 2020-02-06 – 2020-02-07 (×2): 50 mg via ORAL
  Filled 2020-02-06 (×2): qty 2

## 2020-02-06 MED ORDER — CHLORHEXIDINE GLUCONATE CLOTH 2 % EX PADS
6.0000 | MEDICATED_PAD | Freq: Every day | CUTANEOUS | Status: DC
  Administered 2020-02-06 – 2020-02-07 (×2): 6 via TOPICAL

## 2020-02-06 MED ORDER — IPRATROPIUM-ALBUTEROL 0.5-2.5 (3) MG/3ML IN SOLN
3.0000 mL | Freq: Four times a day (QID) | RESPIRATORY_TRACT | Status: DC
  Administered 2020-02-06 (×2): 3 mL via RESPIRATORY_TRACT
  Filled 2020-02-06 (×2): qty 3

## 2020-02-06 MED ORDER — HYDROCODONE-ACETAMINOPHEN 5-325 MG PO TABS
ORAL_TABLET | ORAL | Status: AC
  Filled 2020-02-06: qty 1

## 2020-02-06 MED ORDER — DOXERCALCIFEROL 4 MCG/2ML IV SOLN
2.5000 ug | INTRAVENOUS | Status: DC
  Administered 2020-02-07: 2.5 ug via INTRAVENOUS
  Filled 2020-02-06: qty 2

## 2020-02-06 MED ORDER — FUROSEMIDE 80 MG PO TABS
180.0000 mg | ORAL_TABLET | Freq: Two times a day (BID) | ORAL | Status: DC
  Administered 2020-02-06: 180 mg via ORAL
  Filled 2020-02-06: qty 1

## 2020-02-06 NOTE — TOC Initial Note (Addendum)
Transition of Care [facility]) - Initial/Assessment Note    Patient Details  Name: Barry Lawrence MRN: [id number] Date of Birth: 01/17/1946  Transition of Care [facility] CM/SW Contact:    Pollie Friar, RN Phone Number: 02/06/2020, 12:43 PM  Clinical Narrative:                 [facility] [place] [facility]. CM reached out to [facility] through [facility] and faxed her the order form for pts [employer]. The [facility] will have the D[facility] delivered to his home.  Maudie Mercury is researching to see how to arrange the outpatient therapy through [facility] (per pt request).  TOC following.  1450: orders for outpatient faxed to [facility].   Expected Discharge Plan: OP Rehab Barriers to Discharge: Continued Medical Work up   Patient Goals and CMS Choice   CMS Medicare.gov Compare Post Acute Care list provided to:: Patient Choice offered to / list presented to : Patient  Expected Discharge Plan and Services Expected Discharge Plan: OP Rehab   Discharge Planning Services: CM Consult Post Acute Care Choice: Durable Medical Equipment Living arrangements for the past 2 months: Single Family Home                 DME Arranged: Walker rolling with seat       Representative spoke with at DME Agency: [place]            Prior Living Arrangements/Services Living arrangements for the past 2 months: Single Family Home Lives with:: Spouse Patient language and need for interpreter reviewed:: Yes Do you feel safe going back to the place where you live?: Yes      Need for Family Participation in Patient Care: Yes (Comment) Care giver support system in place?: Yes (comment)   Criminal Activity/Legal Involvement Pertinent to Current Situation/Hospitalization: No - Comment as needed  Activities of Daily Living      Permission Sought/Granted                  Emotional Assessment Appearance:: Appears stated age Attitude/Demeanor/Rapport: Engaged Affect (typically observed):  Accepting Orientation: : Oriented to Self, Oriented to Place, Oriented to  Time, Oriented to Situation   Psych Involvement: No (comment)  Admission diagnosis:  Hemorrhagic cerebrovascular accident (CVA) [facility]) [I61.9] Patient Active Problem List   Diagnosis Date Noted  . Hemorrhagic cerebrovascular accident (CVA) ([place]) 02/05/2020  . CKD (chronic kidney disease) stage 5, GFR less than 15 ml/min (HCC) 08/07/2018  . Pulmonary edema   . Systolic congestive heart failure ([place])   . Noncardiogenic pulmonary edema 08/06/2018  . Bradycardia, drug induced 08/05/2018  . SOB (shortness of breath) 08/04/2018  . Pleural effusion   . Parapneumonic effusion 12/12/2014  . Pneumonia due to Klebsiella pneumoniae ([place])   . Acute on chronic diastolic heart failure ([place])   . Sepsis ([place]) 11/30/2014  . CAP (community acquired pneumonia) 11/29/2014  . Chronic diastolic CHF (congestive heart failure) ([place]) 11/29/2014  . Cellulitis of left lower extremity 10/06/2014  . Solitary pulmonary nodule 09/04/2014  . Warfarin anticoagulation   . Hypokalemia 06/15/2014  . Wound infection 06/15/2014  . Encounter for central line placement   . HCAP (healthcare-associated pneumonia)   . AAA (abdominal aortic aneurysm) ([place]) 05/30/2014  . Acute respiratory failure with hypoxia ([place])   . Post-operative state   . Respiratory failure ([place])   . Thoracic ascending aortic aneurysm ([place]) 03/31/2014  . CKD (chronic kidney disease), stage  IV ([place])   . Sleep apnea   . Cigarette smoker 03/29/2014  . COPD GOLD II still smoking  03/26/2014  . Abdominal aortic aneurysm without rupture ([place]) 03/18/2014  . Hyperlipidemia 02/16/2013  . CAD (coronary artery disease) 07/15/2012  . GERD (gastroesophageal reflux disease) 07/15/2012  . Carotid artery disease   . Hypertensive heart disease   . PAD (peripheral artery disease) ([place]) 06/12/2009   PCP:  Blane Ohara, MD Pharmacy:   [facility] [place] - [address] AT [place] [address] [place] [address] Phone: [phone] Fax: [phone]  [place], [place]. [address] [place] [address] Phone: [phone] Fax: [phone]     Social Determinants of Health (SDOH) Interventions    Readmission Risk Interventions No flowsheet data found.

## 2020-02-06 NOTE — Evaluation (Addendum)
Occupational Therapy Evaluation Patient Details Name: Barry Lawrence MRN: [id number] DOB: 25-Aug-1945 Today's Date: 02/06/2020    History of Present Illness Pt is a 74 y/o male with a PMH significant for MI - CABG 1996, PAD, HTN, borderline DM, CKD III on HD, CAD, AAA s/p aortobifemoral bypass grafting, back surgery x2. He presents to the ED [facility] with bilateral UE weakness CT and MRI revealed hemorrhagic stroke ini posterior limb of the internal capsule. Pt transferred to [facility] for further neurology work-up.   Clinical Impression   PT admitted with hemorrhagic CVA. Pt currently with functional limitiations due to the deficits listed below (see OT problem list). Pt noted to have decreased balance and decrease safety awareness Pt will benefit from skilled OT to increase their independence and safety with adls and balance to allow discharge outpatient [place] .  Pt and wife help to care for great grandchildren 3-5 days per week.      Follow Up Recommendations  Outpatient OT (requesting [place] outpatient)    Equipment Recommendations  Other (comment) (rollator)    Recommendations for Other Services       Precautions / Restrictions Precautions Precautions: Fall Restrictions Weight Bearing Restrictions: No      Mobility Bed Mobility Overal bed mobility: Independent                Transfers Overall transfer level: Needs assistance Equipment used: Rolling walker (2 wheeled) Transfers: Sit to/from Stand Sit to Stand: Supervision         General transfer comment: RW    Balance Overall balance assessment: Needs assistance         Standing balance support: Single extremity supported;During functional activity Standing balance-Leahy Scale: Fair                             ADL either performed or assessed with clinical judgement   ADL Overall ADL's : Needs assistance/impaired Eating/Feeding: Set up Eating/Feeding Details  (indicate cue type and reason): reports he using bil ue to make sure he doesnt drop things Grooming: Min guard;Standing Grooming Details (indicate cue type and reason): washing hands at sink and needs cues to remain inside RW an  leaning against sink surface               Lower Body Dressing Details (indicate cue type and reason): reports baseline wearing sandals liek this sesssion due to decrease ability to reach feet and pain from previous injury to L LE Toilet Transfer: Min guard;Ambulation           Functional mobility during ADLs: Min guard;Rolling walker       Vision Baseline Vision/History: Wears glasses Wears Glasses: Reading only       Perception     Praxis      Pertinent Vitals/Pain Pain Assessment: No/denies pain     Hand Dominance Right   Extremity/Trunk Assessment Upper Extremity Assessment Upper Extremity Assessment: RUE deficits/detail;LUE deficits/detail RUE Deficits / Details: pt with HD port in R UE, Pt reports L ue weaker than R UE. howver both seem similar in strength and function RUE Sensation: WNL RUE Coordination: WNL LUE Deficits / Details: pt reports "this one shakes" initially unable to get L UE to demonstrate. As the patient fatigued the L UE noted to have slight hand tremor   Lower Extremity Assessment Lower Extremity Assessment: Defer to PT evaluation   Cervical / Trunk Assessment Cervical / Trunk Assessment: Other  exceptions Cervical / Trunk Exceptions: foward head and reports hx of back pain   Communication Communication Communication: No difficulties   Cognition Arousal/Alertness: Awake/alert Behavior During Therapy: WFL for tasks assessed/performed Overall Cognitive Status: Impaired/Different from baseline Area of Impairment: Safety/judgement                         Safety/Judgement: Decreased awareness of safety Awareness: Emergent   General Comments: pt slightly impulsive like pushing away RW and wife reports  that this is normal behavior.    General Comments       Exercises     Shoulder Instructions      Home Living Family/patient expects to be discharged to:: Private residence Living Arrangements: Spouse/significant other Available Help at Discharge: Family;Available 24 hours/day Type of Home: House Home Access: Stairs to enter [employer] of Steps: 7-12 Entrance Stairs-Rails: Right;Left;Can reach both Home Layout: Two level;Laundry or work area in basement Alternate Therapist, sports of Steps: flight Alternate Level Stairs-Rails: Right Bathroom Shower/Tub: Teacher, early years/pre: Standard     Home Equipment: Environmental consultant - 2 wheels          Prior Functioning/Environment Level of Independence: Independent with assistive device(s)        Comments: Indep prior; recently started using RW        OT Problem List: Decreased strength;Decreased activity tolerance;Impaired balance (sitting and/or standing);Decreased safety awareness;Decreased knowledge of use of DME or AE;Decreased knowledge of precautions;Impaired UE functional use      OT Treatment/Interventions: Self-care/ADL training;Therapeutic exercise;Neuromuscular education;Energy conservation;DME and/or AE instruction;Manual therapy;Therapeutic activities;Patient/family education;Balance training    OT Goals(Current goals can be found in the care plan section) Acute Rehab OT Goals Patient Stated Goal: Be able to have his cataract surgery on the 27th OT Goal Formulation: With patient/family Time For Goal Achievement: 02/20/20 Potential to Achieve Goals: Good  OT Frequency: Min 2X/week   Barriers to D/C:            Co-evaluation              AM-PAC OT "6 Clicks" Daily Activity     Outcome Measure Help from another person eating meals?: None Help from another person taking care of personal grooming?: A Little Help from another person toileting, which includes using toliet, bedpan, or  urinal?: A Little Help from another person bathing (including washing, rinsing, drying)?: A Little Help from another person to put on and taking off regular upper body clothing?: A Little Help from another person to put on and taking off regular lower body clothing?: A Lot 6 Click Score: 18   End of Session Equipment Utilized During Treatment: Gait belt;Rolling walker Nurse Communication: Mobility status;Precautions  Activity Tolerance: Patient tolerated treatment well Patient left: in bed (with transport arriving for HD)  OT Visit Diagnosis: Unsteadiness on feet (R26.81);Muscle weakness (generalized) (M62.81)                Time: 8242-3536 OT Time Calculation (min): 26 min Charges:  OT General Charges $OT Visit: 1 Visit OT Evaluation $OT Eval Moderate Complexity: 1 Mod OT Treatments $Self Care/Home Management : 8-22 mins   Brynn, OTR/L  Acute Rehabilitation Services Pager: [phone] Office: [phone] .   Jeri Modena 02/06/2020, 3:03 PM

## 2020-02-06 NOTE — Evaluation (Signed)
Physical Therapy Evaluation Patient Details Name: Barry Lawrence MRN: [id number] DOB: 1946/03/10 Today's Date: 02/06/2020   History of Present Illness  Pt is a 74 y/o male with a PMH significant for MI - CABG 1996, PAD, HTN, borderline DM, CKD III on HD, CAD, AAA s/p aortobifemoral bypass grafting, back surgery x2. He presents to the ED [facility] with bilateral UE weakness CT and MRI revealed hemorrhagic stroke ini posterior limb of the internal capsule. Pt transferred to [facility] for further neurology work-up.    Clinical Impression  Pt admitted with above diagnosis. Pt currently with functional limitations due to the deficits listed below (see PT Problem List). At the time of PT eval pt was able to perform transfers and ambulation with gross min guard assist to supervision for safety and rollator for support. Pt not interested in [facility] follow-up as he does not want anyone coming into his home, but seemed more open to Outpatient PT to maximize functional independence and safety, and return to PLOF. Acutely, pt will benefit from skilled PT to increase their independence and safety with mobility to allow discharge to the venue listed below.       Follow Up Recommendations Outpatient PT;Supervision for mobility/OOB    Equipment Recommendations  Other (comment) (Rollator)    Recommendations for Other Services       Precautions / Restrictions Precautions Precautions: Fall Restrictions Weight Bearing Restrictions: No      Mobility  Bed Mobility               General bed mobility comments: Pt was received sitting up in recliner chair.   Transfers Overall transfer level: Needs assistance Equipment used: None Transfers: Sit to/from Stand Sit to Stand: Supervision         General transfer comment: No assist required on sit<>stand however with mild unsteadiness when initiating mobility.   Ambulation/Gait Ambulation/Gait assistance: Supervision;Min guard Gait  Distance (Feet): 200 Feet Assistive device: Rolling walker (2 wheeled);4-wheeled walker Gait Pattern/deviations: Step-through pattern;Decreased stride length;Trunk flexed Gait velocity: Decreased Gait velocity interpretation: <1.8 ft/sec, indicate of risk for recurrent falls General Gait Details: Initially with RW - pt very flexed in trunk with walker very far out in front of him. VC's for improved posture, closer walker proximity, and forward gaze. Switched out RW for rollator as he is used to this at home. Pt with improved posture overall however continued to require occasional cues for closer walker proximity.   Stairs Stairs: Yes Stairs assistance: Min guard Stair Management: Two rails;Alternating pattern;Forwards Number of Stairs: 5 General stair comments: Pt demonstrated good stability during stair negotiation on practice steps in rehab gym.   Wheelchair Mobility    Modified Rankin (Stroke Patients Only) Modified Rankin (Stroke Patients Only) Pre-Morbid Rankin Score: Moderate disability Modified Rankin: Moderately severe disability     Balance Overall balance assessment: Needs assistance Sitting-balance support: Feet supported;No upper extremity supported Sitting balance-Leahy Scale: Fair     Standing balance support: No upper extremity supported;During functional activity Standing balance-Leahy Scale: Poor Standing balance comment: Reaching out for external support during dynamic standing activity                             Pertinent Vitals/Pain Pain Assessment: 0-10 Pain Score: 5  Pain Location: back pain - sounds like chronic pain since 1988 when he first had back surgery Pain Descriptors / Indicators: Aching Pain Intervention(s): Repositioned;Monitored during session    Home  Living Family/patient expects to be discharged to:: Private residence Living Arrangements: Spouse/significant other Available Help at Discharge: Family;Available 24  hours/day Type of Home: House Home Access: Stairs to enter Entrance Stairs-Rails: Right;Left;Can reach both Entrance Stairs-Number of Steps: 7-12 Home Layout: Two level;Laundry or work area in [place]: Environmental consultant - 2 wheels      Prior Function Level of Independence: Independent with assistive device(s)         Comments: Independent prior to this incident - started using the walker when this happened.      Hand Dominance   Dominant Hand: Right    Extremity/Trunk Assessment   Upper Extremity Assessment Upper Extremity Assessment: RUE deficits/detail RUE Deficits / Details: Bilaterally, UE's are generally weak with no reported sensation differences with light touch testing. Finger to nose decreased on R with poor trajectory and speed, and decreased fine motor compared to L.  RUE Sensation: WNL RUE Coordination: decreased fine motor;decreased gross motor    Lower Extremity Assessment Lower Extremity Assessment: Generalized weakness;RLE deficits/detail RLE Deficits / Details: Grossly 4-/5 bilaterally in quads, hamstrings, hip flexors, ankle DF. Pt wearing flip flops during session - reports this is his shoe of choice due to "foot problems". Noted pt tripping on R several times. Able to recover without assistance but mild gross coordination decreased in R throughout OOB mobility. Pt reports sensation intact with light touch testing, however at night pt reports he cannot feel the sheets on his feet or know whether he has his feet covered or not unless he's looking at them.  RLE Sensation: decreased proprioception;decreased light touch RLE Coordination: decreased gross motor    Cervical / Trunk Assessment Cervical / Trunk Assessment: Other exceptions Cervical / Trunk Exceptions: Forward head posture with rounded shoulders  Communication   Communication: No difficulties  Cognition Arousal/Alertness: Awake/alert Behavior During Therapy: WFL for tasks  assessed/performed Overall Cognitive Status: Impaired/Different from baseline Area of Impairment: Safety/judgement;Awareness;Problem solving                         Safety/Judgement: Decreased awareness of safety;Decreased awareness of deficits Awareness: Emergent Problem Solving: Slow processing;Requires verbal cues        General Comments      Exercises     Assessment/Plan    PT Assessment Patient needs continued PT services  PT Problem List Decreased strength;Decreased activity tolerance;Decreased balance;Decreased mobility;Decreased knowledge of use of DME;Decreased safety awareness;Decreased knowledge of precautions;Pain;Decreased coordination;Decreased cognition       PT Treatment Interventions DME instruction;Gait training;Stair training;Functional mobility training;Therapeutic exercise;Therapeutic activities;Neuromuscular re-education;Balance training;Cognitive remediation;Patient/family education    PT Goals (Current goals can be found in the Care Plan section)  Acute Rehab PT Goals Patient Stated Goal: Be able to have his cataract surgery on the 27th PT Goal Formulation: With patient Time For Goal Achievement: 02/13/20 Potential to Achieve Goals: Good    Frequency Min 4X/week   Barriers to discharge        Co-evaluation               AM-PAC PT "6 Clicks" Mobility  Outcome Measure Help needed turning from your back to your side while in a flat bed without using bedrails?: None Help needed moving from lying on your back to sitting on the side of a flat bed without using bedrails?: None Help needed moving to and from a bed to a chair (including a wheelchair)?: A Little Help needed standing up from a chair using your arms (e.g.,  wheelchair or bedside chair)?: A Little Help needed to walk in hospital room?: A Little Help needed climbing 3-5 steps with a railing? : A Little 6 Click Score: 20    End of Session Equipment Utilized During Treatment:  Gait belt Activity Tolerance: Patient tolerated treatment well Patient left: in chair;with call bell/phone within reach Nurse Communication: Mobility status PT Visit Diagnosis: Unsteadiness on feet (R26.81);Other symptoms and signs involving the nervous system (R29.898)    Time: 3403-7096 PT Time Calculation (min) (ACUTE ONLY): 31 min   Charges:   PT Evaluation $PT Eval Moderate Complexity: 1 Mod PT Treatments $Gait Training: 8-22 mins        Rolinda Roan, PT, DPT Acute Rehabilitation Services Pager: [phone] Office: [phone]   Thelma Comp 02/06/2020, 10:17 AM

## 2020-02-06 NOTE — Consult Note (Addendum)
ESRD Consult Note  Requesting provider: Bonnielee Haff Service requesting consult: Hospitalist Reason for consult: ESRD, provision of dialysis Indication for acute dialysis?: End Stage Renal Disease  Outpatient dialysis unit: [facility] Outpatient dialysis schedule: TTS  Assessment/Recommendations: Barry Lawrence is a/an 74 y.o. male with a past medical history notable for ESRD on HD admitted with hemorrhagic stroke.   # ESRD: Have asked [facility] to fax [place] records for HD. Until then treatment as follows. 4hrs, 2K, 2.5Ca, Na 137, Bicarbonate 35, Dialyzer: F180. Hd today and then again tomorrow to resume TTS. Schedule. BFR/DFR 300/600 today given acute CVA w/ hemorrhage but given no signs of increased intracranial pressure can likely increase to 450/800 tomorrow. # Volume/ hypertension: EDW 76kg?. 3L of UF today. Continue home BP meds and monitor # Anemia of Chronic Kidney Disease: Hemoglobin 11.4. No esa needed # Secondary Hyperparathyroidism/Hyperphosphatemia: Ca 9.5. Phos added on. Continue home auryxia and obtain outpt records # Vascular access: AVF with no issues # Hospital problem: Hemorrhagic CVA: holding warfarin. Minimal deficits. Stroke team to eval.  # Additional recommendations: - Dose all meds for creatinine clearance < 10 ml/min  - Unless absolutely necessary, no MRIs with gadolinium.  - Implement save arm precautions.  Prefer needle sticks in the dorsum of the hands or wrists.  No blood pressure measurements in arm. - If blood transfusion is requested during hemodialysis sessions, please alert [place] prior to the session.   Recommendations were discussed with the primary team.  ADDENDUM: records obtained outpt [facility] 300, 4hr, EDW 76kg, K 3, Ca 2.5, Na 138, bicarb 35; hecterol 2.43mcg with each treatment. Also takes lasix 160mg  BID  10:54 AM    History of Present Illness: Barry Lawrence is a/an 74 y.o. male with a past medical history of ESRD, AAA, CAD,  PAD, BPH, HTN, COPD, tobacco use disorder, atrial fibrillation who presents with right-sided weakness.  Based on ER report the patient presented with right-sided weakness that began on 9/13 after a dental appointment.  The patient tells me he really came to the emergency department because his right jaw had been hurting for weeks.  He has had multiple dental procedures trying to help with his jaw pain and states that there was concern that it was not related to dental issues.  He states that the weakness was mild and has improved since admission.  He has not had any headache, speech changes, altered mental status, chest pain, shortness of breath.  He last had dialysis on Saturday and has not had issues.  In the emergency department he was noted to have an INR 3.2.  A CT head demonstrated questionable hemorrhage and MRI demonstrated small CVA with hemorrhage.  He was given vitamin K and FFP.  He was then transferred to [facility] from [facility].  Patient states he feels relatively well today.  He denies significant complaints other than jaw pain and feeling that he has a lot of volume on at this time.  He typically takes off about 3 L of fluid with each dialysis session.  He sometimes has trouble tolerating more due to cramps.   Medications:  Current Facility-Administered Medications  Medication Dose Route Frequency Provider Last Rate Last Admin  . 0.9 %  sodium chloride infusion (Manually program via Guardrails IV Fluids)   Intravenous Once Manuella Ghazi, Pratik D, DO      . 0.9 %  sodium chloride infusion  250 mL Intravenous PRN Manuella Ghazi, Pratik D, DO      .  acetaminophen (TYLENOL) tablet 650 mg  650 mg Oral Q6H PRN Heath Lark D, DO   650 mg at 02/05/20 1954   Or  . acetaminophen (TYLENOL) suppository 650 mg  650 mg Rectal Q6H PRN Manuella Ghazi, Pratik D, DO      . atorvastatin (LIPITOR) tablet 80 mg  80 mg Oral QHS Shah, Pratik D, DO   80 mg at 02/05/20 2101  . Chlorhexidine Gluconate Cloth 2 % PADS 6 each  6 each  Topical Q0600 Reesa Chew, MD   6 each at 02/06/20 1001  . famotidine (PEPCID) tablet 20 mg  20 mg Oral Daily Manuella Ghazi, Pratik D, DO   20 mg at 02/06/20 0959  . ferric citrate (AURYXIA) tablet 210 mg  210 mg Oral TID WC Shah, Pratik D, DO   210 mg at 02/06/20 0820  . finasteride (PROSCAR) tablet 5 mg  5 mg Oral Daily Manuella Ghazi, Pratik D, DO   5 mg at 02/06/20 0959  . HYDROcodone-acetaminophen (NORCO/VICODIN) 5-325 MG per tablet 1 tablet  1 tablet Oral Q4H PRN Shalhoub, Sherryll Burger, MD   1 tablet at 02/06/20 0810  . labetalol (NORMODYNE) injection 10 mg  10 mg Intravenous Q2H PRN Manuella Ghazi, Pratik D, DO      . levalbuterol Penne Lash) nebulizer solution 0.63 mg  0.63 mg Nebulization Q6H PRN Manuella Ghazi, Pratik D, DO      . nicotine (NICODERM CQ - dosed in mg/24 hr) patch 7 mg  7 mg Transdermal Daily Manuella Ghazi, Pratik D, DO      . ondansetron (ZOFRAN) tablet 4 mg  4 mg Oral Q6H PRN Manuella Ghazi, Pratik D, DO       Or  . ondansetron (ZOFRAN) injection 4 mg  4 mg Intravenous Q6H PRN Manuella Ghazi, Pratik D, DO      . sodium chloride flush (NS) 0.9 % injection 3 mL  3 mL Intravenous Q12H Shah, Pratik D, DO   3 mL at 02/06/20 1003  . sodium chloride flush (NS) 0.9 % injection 3 mL  3 mL Intravenous PRN Manuella Ghazi, Pratik D, DO      . tamsulosin (FLOMAX) capsule 0.4 mg  0.4 mg Oral Daily Manuella Ghazi, Pratik D, DO   0.4 mg at 02/06/20 5621     ALLERGIES Carvedilol, Metolazone, and Niaspan [niacin er]  MEDICAL HISTORY Past Medical History:  Diagnosis Date  . Abdominal aortic aneurysm ([place]) 01/28/10; 02/05/14   4.3x4.6cm;  now measuring 5.5 cm, status post aortobifemoral bypass grafting in January 2016 by Dr. problem along with left renal artery bypass  . Arthritis    in his back  . CAD (coronary artery disease)    myoview 04/21/11-normal, EF49%  . Carotid stenosis 01/15/08   right endarterectomy-Dr. Amedeo Plenty  . CKD (chronic kidney disease), stage III   . Diabetes ([place])    BORDERLINE  . GERD (gastroesophageal reflux disease)   . Headache    uses [employer], states he is lessening what he is taking for prep. for surgery   . Hyperlipemia   . Hypertension    stress test- done 04/2014, followed by Dr. Gwenlyn Found  . Myocardial infarction ([place]) 1996  . OSA on CPAP    CPAP q night , CPAP is through [place], states doesn't remember when he had the last study  . PAD (peripheral artery disease) ([place]) 06/12/09   a. 11/22/07 PTA & stenting right external iliac artery;bilateral iliac & PTI and stenting 1997;right ICA =>50% reduction,right SFA >50%,left ICA at stent => 50% reduction,left EIA =>  50% reduction,left ATA occluded, left SFA mid > 49% reduction;  03/2014 Angio: LRA 90, patent L Iliac stent and distal RCIA stent, large saccular AAA.  [name] Pneumonia    hosp. as a child with pneumonia, not since   . Polycythemia    H/O  . S/P CABG (coronary artery bypass graft) 1996  . Tobacco abuse      SOCIAL HISTORY Social History   Socioeconomic History  . Marital status: Married    Spouse name: Not on file  . Number of children: 2  . Years of education: Not on file  . Highest education level: Not on file  Occupational History  . Occupation: Retired Estate manager/land agent  Tobacco Use  . Smoking status: Current Every Day Smoker    Packs/day: 1.00    Years: 56.00    Pack years: 56.00    Types: Cigarettes  . Smokeless tobacco: Never Used  Vaping Use  . Vaping Use: Former  Substance and Sexual Activity  . Alcohol use: No    Alcohol/week: 0.0 standard drinks  . Drug use: No  . Sexual activity: Not on file  Other Topics Concern  . Not on file  Social History Narrative   Lives with wife.   Social Determinants of Health   Financial Resource Strain:   . Difficulty of Paying Living Expenses: Not on file  Food Insecurity:   . Worried About Charity fundraiser in the Last Year: Not on file  . Ran Out of Food in the Last Year: Not on file  Transportation Needs:   . Lack of Transportation (Medical): Not on file  . Lack of Transportation (Non-Medical): Not on  file  Physical Activity:   . Days of Exercise per Week: Not on file  . Minutes of Exercise per Session: Not on file  Stress:   . Feeling of Stress : Not on file  Social Connections:   . Frequency of Communication with Friends and Family: Not on file  . Frequency of Social Gatherings with Friends and Family: Not on file  . Attends Religious Services: Not on file  . Active Member of Clubs or Organizations: Not on file  . Attends Archivist Meetings: Not on file  . Marital Status: Not on file  Intimate Partner Violence:   . Fear of Current or Ex-Partner: Not on file  . Emotionally Abused: Not on file  . Physically Abused: Not on file  . Sexually Abused: Not on file     FAMILY HISTORY Family History  Problem Relation Age of Onset  . Heart failure Father      Review of Systems: 12 systems were reviewed and negative except per HPI  Physical Exam: Vitals:   02/06/20 0344 02/06/20 0752  BP: (!) 157/67 (!) 168/86  Pulse: (!) 52 61  Resp: 19 16  Temp: 98.2 F (36.8 C) 98 F (36.7 C)  SpO2: 97% 95%   No intake/output data recorded.  Intake/Output Summary (Last 24 hours) at 02/06/2020 1005 Last data filed at 02/05/2020 2330 Gross per 24 hour  Intake 569 ml  Output --  Net 569 ml   General: well-appearing, no acute distress HEENT: anicteric sclera, MMM CV: normal rate, no murmurs, trace edema in the bilateral lower extremities Lungs: bilateral chest rise, scattered expiratory wheezing, normal wob Abd: soft, non-tender, mild distention Skin: no visible lesions or rashes Psych: alert, engaged, appropriate mood and affect Extremities: Trace edema, aVF with palpable thrill  Test Results Reviewed Lab Results  Component Value Date   NA 133 (L) 02/06/2020   K 4.8 02/06/2020   CL 98 02/06/2020   CO2 26 02/06/2020   BUN 53 (H) 02/06/2020   CREATININE 4.79 (H) 02/06/2020   CALCIUM 9.5 02/06/2020   ALBUMIN 3.5 02/05/2020   PHOS 4.7 (H) 08/08/2018    I have  reviewed relevant outside healthcare records

## 2020-02-06 NOTE — Progress Notes (Signed)
  Echocardiogram 2D Echocardiogram has been performed.  Bobbye Charleston 02/06/2020, 1:36 PM

## 2020-02-06 NOTE — Progress Notes (Addendum)
[facility] PROGRESS NOTE   Barry Lawrence QQI:[id number] DOB: [birth year] DOA: 02/05/2020  PCP: Blane Ohara, MD  Brief History/Interval Summary: 74 y.o. male with medical history significant for ESRD on HD TTS, AAA, CAD, PAD, BPH, hypertension, dyslipidemia, COPD with active tobacco use, and atrial fibrillation on warfarin who presented to the ED with complaints of right-sided weakness that began shortly after dental appointment after he had a tooth extraction.  Patient was noted to have hemorrhagic CVA on MRI brain.  Patient was transferred to [facility] for further management.  Reason for Visit: Acute stroke  Consultants: Neurology.  Nephrology  Procedures: None  Antibiotics: Anti-infectives (From admission, onward)   None      Subjective/Interval History: Patient very anxious that he has not been dialyzed yet.  He denies any difficulty breathing.  He continues to have some weakness in the right side.  Denies any chest pain or shortness of breath.  ROS: Denies any nausea or vomiting    Assessment/Plan:  Acute hemorrhagic infarct  Noted to be in the posterior limb of the internal capsule.  Patient's INR was 3.2 at the time of admission.  It was reversed using vitamin K and FFP.  Patient has some weakness on the left side.  Patient seen by telemetry neurology when he was an event hospital.  Waiting on neurology input here.  PT and OT evaluation.  Currently on no antiplatelet agents due to hemorrhage noted on imaging studies.  Noted to be on Lipitor 80 mg daily.  LDL noted to be 30.  HbA1c 5.5. Blood pressure goals per neurology. Stroke team to evaluate patient today. Patient needs outpatient physical therapy.  History of atrial fibrillation on warfarin Warfarin had to be reversed due to hemorrhagic infarct.  INR 1.4 this morning.   End-stage renal disease on hemodialysis on Tuesday Thursday Saturday Patient was last dialyzed on Saturday.  Nephrology has been  consulted.  History of coronary artery disease/peripheral artery disease Stable.  Continue statin.  Essential hypertension Blood pressure goals per neurology considering acute stroke.  Noted to be on amlodipine at home.  History of BPH Continue with Flomax.  History of COPD/tobacco abuse Continues to smoke.  Wheezing is appreciated.  Use inhalers.  Patient was counseled.  Nicotine patch.  History of AAA Followed by vascular surgery.  Denies any abdominal pain currently.  Normocytic anemia No evidence of overt blood loss.  Monitor hemoglobin.  DVT Prophylaxis: SCDs Code Status: Full code Family Communication: Discussed with the patient Disposition Plan: Hopefully return home when improved  Status is: Inpatient  Remains inpatient appropriate because:IV treatments appropriate due to intensity of illness or inability to take PO and Inpatient level of care appropriate due to severity of illness   Dispo: The patient is from: Home              Anticipated d/c is to: Home              Anticipated d/c date is: 2 days              Patient currently is not medically stable to d/c.    Medications:  Scheduled: . sodium chloride   Intravenous Once  . atorvastatin  80 mg Oral QHS  . Chlorhexidine Gluconate Cloth  6 each Topical Q0600  . [START ON 02/07/2020] doxercalciferol  2.5 mcg Intravenous Q T,Th,Sa-HD  . famotidine  20 mg Oral Daily  . ferric citrate  210 mg Oral TID WC  .  finasteride  5 mg Oral Daily  . furosemide  180 mg Oral BID  . nicotine  7 mg Transdermal Daily  . sodium chloride flush  3 mL Intravenous Q12H  . tamsulosin  0.4 mg Oral Daily   Continuous: . sodium chloride     STM:HDQQIW chloride, acetaminophen **OR** acetaminophen, HYDROcodone-acetaminophen, labetalol, levalbuterol, ondansetron **OR** ondansetron (ZOFRAN) IV, sodium chloride flush   Objective:  Vital Signs  Vitals:   02/05/20 2237 02/05/20 2330 02/06/20 0344 02/06/20 0752  BP: (!) 145/114  (!) 158/75 (!) 157/67 (!) 168/86  Pulse: 61 62 (!) 52 61  Resp: 18 16 19 16   Temp: 98.6 F (37 C) 98.1 F (36.7 C) 98.2 F (36.8 C) 98 F (36.7 C)  TempSrc: Oral Oral Oral Oral  SpO2: 98% 94% 97% 95%  Weight:      Height:        Intake/Output Summary (Last 24 hours) at 02/06/2020 1136 Last data filed at 02/05/2020 2330 Gross per 24 hour  Intake 569 ml  Output --  Net 569 ml   Filed Weights   02/05/20 0554  Weight: 79.2 kg    General appearance: Awake alert.  In no distress Resp: Normal effort.  Mild wheezing appreciated bilateral lungs.  Few crackles at the bases. Cardio: S1-S2 is normal regular.  No S3-S4.  No rubs murmurs or bruit GI: Abdomen is soft.  Nontender nondistended.  Bowel sounds are present normal.  No masses organomegaly Extremities: No edema.  Full range of motion of lower extremities. Neurologic: Alert and oriented x3.  right-sided weakness noted.  No facial asymmetry.   Lab Results:  Data Reviewed: I have personally reviewed following labs and imaging studies  CBC: Recent Labs  Lab 02/05/20 0839 02/06/20 0152  WBC 9.5 7.8  NEUTROABS 7.4  --   HGB 13.1 11.4*  HCT 42.0 36.3*  MCV 100.5* 98.9  PLT 281 979    Basic Metabolic Panel: Recent Labs  Lab 02/05/20 0839 02/06/20 0152 02/06/20 1016  NA 135 133*  --   K 4.5 4.8  --   CL 99 98  --   CO2 25 26  --   GLUCOSE 100* 107*  --   BUN 51* 53*  --   CREATININE 4.83* 4.79*  --   CALCIUM 9.1 9.5  --   MG  --  3.1*  --   PHOS  --   --  4.1    GFR: Estimated Creatinine Clearance: 12.8 mL/min (A) (by C-G formula based on SCr of 4.79 mg/dL (H)).  Liver Function Tests: Recent Labs  Lab 02/05/20 0839  AST 13*  ALT 14  ALKPHOS 91  BILITOT 0.4  PROT 6.4*  ALBUMIN 3.5     Coagulation Profile: Recent Labs  Lab 02/05/20 0839 02/06/20 0152  INR 3.2* 1.4*     HbA1C: Recent Labs    02/05/20 1446  HGBA1C 5.5    Lipid Profile: Recent Labs    02/05/20 1447  CHOL 74  HDL 30*   LDLCALC 30  TRIG 68  CHOLHDL 2.5      Recent Results (from the past 240 hour(s))  SARS Coronavirus 2 by RT PCR (hospital order, performed in [place] hospital lab) Nasopharyngeal Nasopharyngeal Swab     Status: None   Collection Time: 02/05/20  2:50 PM   Specimen: Nasopharyngeal Swab  Result Value Ref Range Status   SARS Coronavirus 2 NEGATIVE NEGATIVE Final    Comment: (NOTE) SARS-CoV-2 target nucleic acids are NOT  DETECTED.  The SARS-CoV-2 RNA is generally detectable in upper and lower respiratory specimens during the acute phase of infection. The lowest concentration of SARS-CoV-2 viral copies this assay can detect is 250 copies / mL. A negative result does not preclude SARS-CoV-2 infection and should not be used as the sole basis for treatment or other patient management decisions.  A negative result may occur with improper specimen collection / handling, submission of specimen other than nasopharyngeal swab, presence of viral mutation(s) within the areas targeted by this assay, and inadequate number of viral copies (<250 copies / mL). A negative result must be combined with clinical observations, patient history, and epidemiological information.  Fact Sheet for Patients:   StrictlyIdeas.no  Fact Sheet for Healthcare Providers: BankingDealers.co.za  This test is not yet approved or  cleared by the [place] FDA and has been authorized for detection and/or diagnosis of SARS-CoV-2 by FDA under an Emergency Use Authorization (EUA).  This EUA will remain in effect (meaning this test can be used) for the duration of the COVID-19 declaration under Section 564(b)(1) of the Act, 21 U.S.C. section 360bbb-3(b)(1), unless the authorization is terminated or revoked sooner.  Performed at [facility], [address], [place], [place] [address]       Radiology Studies: MR ANGIO HEAD WO CONTRAST  Result Date:  02/05/2020 CLINICAL DATA:  Stroke.  Right-sided weakness. EXAM: MRI HEAD WITHOUT CONTRAST MRA HEAD WITHOUT CONTRAST TECHNIQUE: Multiplanar, multiecho pulse sequences of the brain and surrounding structures were obtained without intravenous contrast. Angiographic images of the head were obtained using MRA technique without contrast. COMPARISON:  CT head 02/05/2020 FINDINGS: MRI HEAD FINDINGS Brain: Small acute infarct posterior limb internal capsule with associated small amount of hemorrhage. This corresponds to the area on CT. No other areas of acute infarct. Scattered periventricular white matter hyperintensities bilaterally compatible with chronic microvascular ischemia. Negative for mass or hydrocephalus. Vascular: Normal arterial flow voids. Skull and upper cervical spine: Negative Sinuses/Orbits: Paranasal sinuses clear.  Negative orbit Other: Motion degraded study MRA HEAD FINDINGS Motion degraded study. Right vertebral dominant and patent to the basilar. Right PICA patent. Left vertebral artery ends in PICA. Basilar patent without stenosis. Fetal origin of the posterior cerebral artery bilaterally. Moderate stenosis left P2 segment. Right posterior cerebral artery patent. Internal carotid artery patent bilaterally without stenosis. Anterior and middle cerebral arteries widely patent bilaterally. IMPRESSION: 1. Small acute hemorrhagic infarct posterior limb internal capsule, corresponding to the abnormality on CT. No other acute infarct 2. Chronic microvascular ischemic change in the white matter 3. MRA degraded by motion. Moderate stenosis left P2 segment. No intracranial large vessel occlusion. Electronically Signed   By: Franchot Gallo M.D.   On: 02/05/2020 12:10   MR BRAIN WO CONTRAST  Result Date: 02/05/2020 CLINICAL DATA:  Stroke.  Right-sided weakness. EXAM: MRI HEAD WITHOUT CONTRAST MRA HEAD WITHOUT CONTRAST TECHNIQUE: Multiplanar, multiecho pulse sequences of the brain and surrounding structures  were obtained without intravenous contrast. Angiographic images of the head were obtained using MRA technique without contrast. COMPARISON:  CT head 02/05/2020 FINDINGS: MRI HEAD FINDINGS Brain: Small acute infarct posterior limb internal capsule with associated small amount of hemorrhage. This corresponds to the area on CT. No other areas of acute infarct. Scattered periventricular white matter hyperintensities bilaterally compatible with chronic microvascular ischemia. Negative for mass or hydrocephalus. Vascular: Normal arterial flow voids. Skull and upper cervical spine: Negative Sinuses/Orbits: Paranasal sinuses clear.  Negative orbit Other: Motion degraded study MRA HEAD FINDINGS Motion degraded  study. Right vertebral dominant and patent to the basilar. Right PICA patent. Left vertebral artery ends in PICA. Basilar patent without stenosis. Fetal origin of the posterior cerebral artery bilaterally. Moderate stenosis left P2 segment. Right posterior cerebral artery patent. Internal carotid artery patent bilaterally without stenosis. Anterior and middle cerebral arteries widely patent bilaterally. IMPRESSION: 1. Small acute hemorrhagic infarct posterior limb internal capsule, corresponding to the abnormality on CT. No other acute infarct 2. Chronic microvascular ischemic change in the white matter 3. MRA degraded by motion. Moderate stenosis left P2 segment. No intracranial large vessel occlusion. Electronically Signed   By: Franchot Gallo M.D.   On: 02/05/2020 12:10   CT HEAD CODE STROKE WO CONTRAST  Result Date: 02/05/2020 CLINICAL DATA:  Code stroke.  Right arm and leg weakness EXAM: CT HEAD WITHOUT CONTRAST TECHNIQUE: Contiguous axial images were obtained from the base of the skull through the vertex without intravenous contrast. COMPARISON:  None. FINDINGS: Brain: Age related volume loss. Mild moderate chronic small-vessel change of the hemispheric white matter. 4-5 mm hyperdensity (low level) in the  posterior limb internal capsule on the left that could represent a tiny recent hemorrhagic infarction. No sign of acute large vessel territory insult at this time. No mass, hydrocephalus or extra-axial collection. Vascular: There is atherosclerotic calcification of the major vessels at the base of the brain. Skull: Negative Sinuses/Orbits: Clear/normal Other: None ASPECTS ([place] Stroke Program Early CT Score) - Ganglionic level infarction (caudate, lentiform nuclei, internal capsule, insula, M1-M3 cortex): 6 - Supraganglionic infarction (M4-M6 cortex): 3 Total score (0-10 with 10 being normal): 9 IMPRESSION: 1. 4-5 mm hyperdensity in the posterior limb internal capsule on the left that could represent a tiny recent hemorrhagic infarction. No sign of acute large vessel territory insult at this time. 2. ASPECTS is 9. 3. These results were called by telephone at the time of interpretation on 02/05/2020 at 8:41 am to provider [facility] ZAMMIT , who verbally acknowledged these results. Electronically Signed   By: Nelson Chimes M.D.   On: 02/05/2020 08:43       LOS: 1 day   [facility] Pager on [email or web address]  02/06/2020, 11:36 AM

## 2020-02-06 NOTE — Progress Notes (Signed)
Carotid study completed.   See CVProc for preliminary results.   Ashlea Dusing, RDMS, RVT 

## 2020-02-06 NOTE — Consult Note (Signed)
Stroke Neurology Consultation Note  Consult Requested by: [facility]  Reason for Consult: incoordination R side, seen by teleneurology at [facility] 02/05/2020 at [place] Date:  02/06/20  History of Present Illness:  Barry Lawrence is an 74 y.o. Caucasian male with PMH of AF on warfarin, ESRD on HD TTS, HTN, HLD, pre-diabetes, PAD s/p aorta-bifem BPG and aorta/renal bypass, carotid stenosis s/p R CEA who presented to [facility] from the dentist office where he had been several times for recurrent jaw pain. CT showed L PLIC possible hemorrhagic infarct. INR 3.2. Patient received vit K and FFP for warfarin reversal, INR now 1.4 off warfarin. . Patient states that is intermittent right mandibular sharp shooting pain has been going on for a month.  He also noticed some swelling in the right mandibular region.  He saw a dentist and underwent tooth extraction but his pain has persisted.  He has not tried any neuropathic pain medications like gabapentin, Topamax and Lyrica.  He has no prior history of trigeminal neuralgia.  He still has mild weakness and feels his balance is off and is unsteady while standing on right foot.  MRI scan of the brain shows small left posterior limb internal capsule hemorrhagic infarct and changes of chronic microvascular disease.  MRI of the brain is motion degraded but showed moderate narrowing of the left P2 segment without any large vessel occlusion.  Transthoracic echo shows normal ejection fraction of 60 to 65% without cardiac source of embolism.  LDL cholesterol is 30 mg percent and hemoglobin A1c is 5.5.  Patient denies any prior history of strokes or TIAs.  He has chronic atrial fibrillation and is on long-term warfarin and states his INR has been fairly stable and he is compliant with  Date last known well: 02/04/20 Time last known well: 02/04/20 tPA Given: No: on warfarin MRS:  1 NIHSS:  2 at [facility]   14 system review of  system is positive for only as documented in HPI and all other negative  Past Medical History:  Diagnosis Date  . Abdominal aortic aneurysm ([place]) 01/28/10; 02/05/14   4.3x4.6cm;  now measuring 5.5 cm, status post aortobifemoral bypass grafting in January 2016 by Dr. problem along with left renal artery bypass  . Arthritis    in his back  . CAD (coronary artery disease)    myoview 04/21/11-normal, EF49%  . Carotid stenosis 01/15/08   right endarterectomy-Dr. Amedeo Plenty  . CKD (chronic kidney disease), stage III   . Diabetes ([place])    BORDERLINE  . GERD (gastroesophageal reflux disease)   . Headache    uses [employer], states he is lessening what he is taking for prep. for surgery   . Hyperlipemia   . Hypertension    stress test- done 04/2014, followed by Dr. Gwenlyn Found  . Myocardial infarction ([place]) 1996  . OSA on CPAP    CPAP q night , CPAP is through [place], states doesn't remember when he had the last study  . PAD (peripheral artery disease) ([place]) 06/12/09   a. 11/22/07 PTA & stenting right external iliac artery;bilateral iliac & PTI and stenting 1997;right ICA =>50% reduction,right SFA >50%,left ICA at stent => 50% reduction,left EIA => 50% reduction,left ATA occluded, left SFA mid > 49% reduction;  03/2014 Angio: LRA 90, patent L Iliac stent and distal RCIA stent, large saccular AAA.  [name] Pneumonia    hosp. as a child with pneumonia, not since   . Polycythemia  H/O  . S/P CABG (coronary artery bypass graft) 1996  . Tobacco abuse      Past Surgical History:  Procedure Laterality Date  . ABDOMINAL AORTAGRAM N/A 04/01/2014   Procedure: ABDOMINAL Maxcine Ham;  Surgeon: [name], MD;  Location: [facility] CATH LAB;  Service: Cardiovascular;  Laterality: N/A;  . AORTA - BILATERAL FEMORAL ARTERY BYPASS GRAFT N/A 05/30/2014   Procedure: AORTOBIFEMORAL BYPASS GRAFT;  Surgeon: Serafina Mitchell, MD;  Location: [place];  Service: Vascular;  Laterality: N/A;  . AORTIC/RENAL BYPASS Left 05/30/2014   Procedure: LEFT  RENAL ARTERY BYPASS;  Surgeon: Serafina Mitchell, MD;  Location: [place];  Service: Vascular;  Laterality: Left;  . BACK SURGERY  1988   at [facility]  . CAROTID ENDARTERECTOMY Right 01/15/08  . COLONOSCOPY N/A 07/09/2013   Procedure: COLONOSCOPY;  Surgeon: Juanita Craver, MD;  Location: [facility];  Service: Endoscopy;  Laterality: N/A;  . CORONARY ARTERY BYPASS GRAFT  1996   LIMA to LAD, sequential vein to OM1 and 2 as well as acure marginal branch and diag branch  . EMPYEMA DRAINAGE Right 12/17/2014   Procedure: EMPYEMA DRAINAGE;  Surgeon: Gaye Pollack, MD;  Location: [place];  Service: Thoracic;  Laterality: Right;  . [place] SURGERY  1998  . pv angio  11/22/2007   PTA/ stenting of right common iliac artery with a 10x58mm Smart stent and postdilated with a 7x61mmpowerflex balloon; high grade calcified stenosis of the RICA  . PV angio  04/01/14   AAA, Lt renal artery stenosis, patent iliacs  . VIDEO ASSISTED THORACOSCOPY (VATS)/DECORTICATION Right 12/17/2014   Procedure: RIGHT VIDEO ASSISTED THORACOSCOPY (VATS);  Surgeon: Gaye Pollack, MD;  Location: [facility] OR;  Service: Thoracic;  Laterality: Right;    Family History  Problem Relation Age of Onset  . Heart failure Father     Social History:  reports that he has been smoking cigarettes. He has a 56.00 pack-year smoking history. He has never used smokeless tobacco. He reports that he does not drink alcohol and does not use drugs.  Review of Systems: A full ROS was attempted today and was able to be performed.  Systems assessed include - Constitutional, Eyes, HENT, Respiratory, Cardiovascular, Gastrointestinal, Genitourinary, Integument/breast, Hematologic/lymphatic, Musculoskeletal, Neurological, Behavioral/Psych, Endocrine, Allergic/Immunologic - with pertinent responses as per HPI.  Allergies:  Allergies  Allergen Reactions  . Carvedilol Other (See Comments)    unknown  . Metolazone Other (See Comments)    Pt says had MI when took  . Niaspan  [Niacin Er] Hives     Medications: I have reviewed the patient's current medications.  Test Results: CBC:  Recent Labs  Lab 02/05/20 0839 02/06/20 0152  WBC 9.5 7.8  NEUTROABS 7.4  --   HGB 13.1 11.4*  HCT 42.0 36.3*  MCV 100.5* 98.9  PLT 281 161   Basic Metabolic Panel:  Recent Labs  Lab 02/05/20 0839 02/06/20 0152 02/06/20 1016  NA 135 133*  --   K 4.5 4.8  --   CL 99 98  --   CO2 25 26  --   GLUCOSE 100* 107*  --   BUN 51* 53*  --   CREATININE 4.83* 4.79*  --   CALCIUM 9.1 9.5  --   MG  --  3.1*  --   PHOS  --   --  4.1   Liver Function Tests: Recent Labs  Lab 02/05/20 0839  AST 13*  ALT 14  ALKPHOS 91  BILITOT 0.4  PROT 6.4*  ALBUMIN 3.5   Coagulation Studies:  Recent Labs    02/05/20 0839 02/06/20 0152  LABPROT 32.0* 16.6*  INR 3.2* 1.4*   Urinalysis:  Recent Labs  Lab 02/05/20 0737  COLORURINE YELLOW  LABSPEC 1.013  PHURINE 7.0  GLUCOSEU NEGATIVE  HGBUR NEGATIVE  BILIRUBINUR NEGATIVE  KETONESUR NEGATIVE  PROTEINUR 100*  NITRITE NEGATIVE  LEUKOCYTESUR MODERATE*   Microbiology:  Results for orders placed or performed during the hospital encounter of 02/05/20  SARS Coronavirus 2 by RT PCR (hospital order, performed in [place] hospital lab) Nasopharyngeal Nasopharyngeal Swab     Status: None   Collection Time: 02/05/20  2:50 PM   Specimen: Nasopharyngeal Swab  Result Value Ref Range Status   SARS Coronavirus 2 NEGATIVE NEGATIVE Final    Comment: (NOTE) SARS-CoV-2 target nucleic acids are NOT DETECTED.  The SARS-CoV-2 RNA is generally detectable in upper and lower respiratory specimens during the acute phase of infection. The lowest concentration of SARS-CoV-2 viral copies this assay can detect is 250 copies / mL. A negative result does not preclude SARS-CoV-2 infection and should not be used as the sole basis for treatment or other patient management decisions.  A negative result may occur with improper specimen collection /  handling, submission of specimen other than nasopharyngeal swab, presence of viral mutation(s) within the areas targeted by this assay, and inadequate number of viral copies (<250 copies / mL). A negative result must be combined with clinical observations, patient history, and epidemiological information.  Fact Sheet for Patients:   StrictlyIdeas.no  Fact Sheet for Healthcare Providers: BankingDealers.co.za  This test is not yet approved or  cleared by the [place] FDA and has been authorized for detection and/or diagnosis of SARS-CoV-2 by FDA under an Emergency Use Authorization (EUA).  This EUA will remain in effect (meaning this test can be used) for the duration of the COVID-19 declaration under Section 564(b)(1) of the Act, 21 U.S.C. section 360bbb-3(b)(1), unless the authorization is terminated or revoked sooner.  Performed at [facility], [address], [place], [place] [address]    Lipid Panel:     Component Value Date/Time   CHOL 74 02/05/2020 1447   TRIG 68 02/05/2020 1447   HDL 30 (L) 02/05/2020 1447   CHOLHDL 2.5 02/05/2020 1447   VLDL 14 02/05/2020 1447   LDLCALC 30 02/05/2020 1447   HgbA1c:  Lab Results  Component Value Date   HGBA1C 5.5 02/05/2020   Urine Drug Screen:     Component Value Date/Time   LABOPIA POSITIVE (A) 02/05/2020 0737   COCAINSCRNUR NONE DETECTED 02/05/2020 0737   LABBENZ NONE DETECTED 02/05/2020 0737   AMPHETMU NONE DETECTED 02/05/2020 0737   THCU NONE DETECTED 02/05/2020 0737   LABBARB NONE DETECTED 02/05/2020 0737    Alcohol Level:  Recent Labs  Lab 02/05/20 0839  ETH <10    DG Chest 2 View  Result Date: 01/13/2020 CLINICAL DATA:  Chest pain and epigastric pain. EXAM: CHEST - 2 VIEW COMPARISON:  Chest plain film, dated August 06, 2018, and prior chest CT dated April 18, 2019. FINDINGS: Multiple sternal wires and vascular clips are seen. Mild diffuse chronic appearing  increased interstitial lung markings are present. A small ill-defined focal opacity is seen overlying the left apex. This is increased in size when compared to the prior exam. A noncalcified lung nodule is seen within this region on the prior chest CT. There is no evidence of a pleural effusion or pneumothorax. The cardiac  silhouette is markedly enlarged. There is moderate severity calcification and tortuosity of the thoracic aorta. The visualized skeletal structures are unremarkable. IMPRESSION: Chronic appearing increased interstitial lung markings with a small ill-defined focal opacity overlying the left apex. This may represent interval enlargement of the left apical lung nodule seen on the prior chest CT. Correlation with follow-up chest CT is recommended. Electronically Signed   By: Virgina Norfolk M.D.   On: 01/13/2020 18:26   MR ANGIO HEAD WO CONTRAST  Result Date: 02/05/2020 CLINICAL DATA:  Stroke.  Right-sided weakness. EXAM: MRI HEAD WITHOUT CONTRAST MRA HEAD WITHOUT CONTRAST TECHNIQUE: Multiplanar, multiecho pulse sequences of the brain and surrounding structures were obtained without intravenous contrast. Angiographic images of the head were obtained using MRA technique without contrast. COMPARISON:  CT head 02/05/2020 FINDINGS: MRI HEAD FINDINGS Brain: Small acute infarct posterior limb internal capsule with associated small amount of hemorrhage. This corresponds to the area on CT. No other areas of acute infarct. Scattered periventricular white matter hyperintensities bilaterally compatible with chronic microvascular ischemia. Negative for mass or hydrocephalus. Vascular: Normal arterial flow voids. Skull and upper cervical spine: Negative Sinuses/Orbits: Paranasal sinuses clear.  Negative orbit Other: Motion degraded study MRA HEAD FINDINGS Motion degraded study. Right vertebral dominant and patent to the basilar. Right PICA patent. Left vertebral artery ends in PICA. Basilar patent without  stenosis. Fetal origin of the posterior cerebral artery bilaterally. Moderate stenosis left P2 segment. Right posterior cerebral artery patent. Internal carotid artery patent bilaterally without stenosis. Anterior and middle cerebral arteries widely patent bilaterally. IMPRESSION: 1. Small acute hemorrhagic infarct posterior limb internal capsule, corresponding to the abnormality on CT. No other acute infarct 2. Chronic microvascular ischemic change in the white matter 3. MRA degraded by motion. Moderate stenosis left P2 segment. No intracranial large vessel occlusion. Electronically Signed   By: Franchot Gallo M.D.   On: 02/05/2020 12:10   MR BRAIN WO CONTRAST  Result Date: 02/05/2020 CLINICAL DATA:  Stroke.  Right-sided weakness. EXAM: MRI HEAD WITHOUT CONTRAST MRA HEAD WITHOUT CONTRAST TECHNIQUE: Multiplanar, multiecho pulse sequences of the brain and surrounding structures were obtained without intravenous contrast. Angiographic images of the head were obtained using MRA technique without contrast. COMPARISON:  CT head 02/05/2020 FINDINGS: MRI HEAD FINDINGS Brain: Small acute infarct posterior limb internal capsule with associated small amount of hemorrhage. This corresponds to the area on CT. No other areas of acute infarct. Scattered periventricular white matter hyperintensities bilaterally compatible with chronic microvascular ischemia. Negative for mass or hydrocephalus. Vascular: Normal arterial flow voids. Skull and upper cervical spine: Negative Sinuses/Orbits: Paranasal sinuses clear.  Negative orbit Other: Motion degraded study MRA HEAD FINDINGS Motion degraded study. Right vertebral dominant and patent to the basilar. Right PICA patent. Left vertebral artery ends in PICA. Basilar patent without stenosis. Fetal origin of the posterior cerebral artery bilaterally. Moderate stenosis left P2 segment. Right posterior cerebral artery patent. Internal carotid artery patent bilaterally without stenosis.  Anterior and middle cerebral arteries widely patent bilaterally. IMPRESSION: 1. Small acute hemorrhagic infarct posterior limb internal capsule, corresponding to the abnormality on CT. No other acute infarct 2. Chronic microvascular ischemic change in the white matter 3. MRA degraded by motion. Moderate stenosis left P2 segment. No intracranial large vessel occlusion. Electronically Signed   By: Franchot Gallo M.D.   On: 02/05/2020 12:10   CT HEAD CODE STROKE WO CONTRAST  Result Date: 02/05/2020 CLINICAL DATA:  Code stroke.  Right arm and leg weakness EXAM: CT HEAD WITHOUT CONTRAST TECHNIQUE:  Contiguous axial images were obtained from the base of the skull through the vertex without intravenous contrast. COMPARISON:  None. FINDINGS: Brain: Age related volume loss. Mild moderate chronic small-vessel change of the hemispheric white matter. 4-5 mm hyperdensity (low level) in the posterior limb internal capsule on the left that could represent a tiny recent hemorrhagic infarction. No sign of acute large vessel territory insult at this time. No mass, hydrocephalus or extra-axial collection. Vascular: There is atherosclerotic calcification of the major vessels at the base of the brain. Skull: Negative Sinuses/Orbits: Clear/normal Other: None ASPECTS ([place] Stroke Program Early CT Score) - Ganglionic level infarction (caudate, lentiform nuclei, internal capsule, insula, M1-M3 cortex): 6 - Supraganglionic infarction (M4-M6 cortex): 3 Total score (0-10 with 10 being normal): 9 IMPRESSION: 1. 4-5 mm hyperdensity in the posterior limb internal capsule on the left that could represent a tiny recent hemorrhagic infarction. No sign of acute large vessel territory insult at this time. 2. ASPECTS is 9. 3. These results were called by telephone at the time of interpretation on 02/05/2020 at 8:41 am to provider [facility] ZAMMIT , who verbally acknowledged these results. Electronically Signed   By: Nelson Chimes M.D.   On: 02/05/2020  08:43    EKG: atrial fibrillation, rate 65, R BBB, T wave abnormalities.  Physical Examination: Temp:  [97.7 F (36.5 C)-98.6 F (37 C)] 98.3 F (36.8 C) (09/15 1138) Pulse Rate:  [52-64] 56 (09/15 1138) Resp:  [13-20] 18 (09/15 1138) BP: (139-168)/(63-114) 139/77 (09/15 1138) SpO2:  [93 %-99 %] 93 % (09/15 1138) Pleasant elderly Caucasian male not in distress.  He has soft tissue swelling over the right mandible which is not tender to touch. . Afebrile. Head is nontraumatic. Neck is supple without bruit.    Cardiac exam no murmur or gallop. Lungs are clear to auscultation. Distal pulses are well felt.  Neurological Exam :  Awake alert oriented to time place and person.  Normal speech and language.  No aphasia apraxia or dysarthria.  Extraocular movements are full range without nystagmus.  Blinks to threat bilaterally.  Fundi not visualized.  Motor system exam shows no upper or lower extremity drift but mild weakness of right hip flexors.  Fine finger movements slightly diminished on the right compared to the left.  He is unsteady while standing on a narrow base and on the right foot unsupported.  Sensation is intact.  Coordination is slow but accurate.  Gait is unsteady broad-based.  Assessment:  Barry Lawrence is a 74 y.o. male with history of AF on warfarin, ESRD on HD TTS, HTN, HLD, pre-diabetes, PAD s/p aorta-bifem BPG and aorta/renal bypass, carotid stenosis s/p R CEA who presented to [facility] from the dentist office where he had been several times for recurrent jaw pain. INR 3.2 on arrival so he did not receive IV t-PA. CT showed small L PLIC w/ possibly hemorrhagic. Received FFP and vit K. Transferred to [employer] [facility] for ongoing care.    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 ASPECTS 9    MRI  Small L PLIC hemorrhagic infarct. Small vessel disease.   MRA  L P2 moderate stenosis   Carotid Doppler pending   2D Echo normal  ejection fraction.  No cardiac source of embolism.  LDL 30  HgbA1c 5.5   SCDs for VTE prophylaxis  warfarin daily prior to admission with INR 3.2 on arrival, now on No antithrombotic.  Now due to intracranial hemorrhage but will start aspirin in a few days and resume warfarin in 2 to 4 weeks.  Therapy recommendations:  OP PT  Disposition:  Anticipate return home  Atrial Fibrillation  Home anticoagulation:  warfarin daily   INR on admission to [facility] 3.2  INR 1.4 today . Resume  aspirin 81 mg daily in 2 to 3 days and warfarin in 2 to 4 weeks   Hypertension  Stable . SBP goal < 180 given petechial hemorrhage  . Long-term BP goal normotensive  Hyperlipidemia  Home meds:  lipitor 80, resumed in hospital  LDL 30, goal < 70  Continue statin at discharge  Pre-Diabetes   HgbA1c 5.5, at goal < 7.0  Other Stroke Risk Factors  Advanced age  Cigarette smoker, advised to stop smoking  Coronary artery disease s/p CABG  Obstructive sleep apnea, on CPAP at home  AAA w/ plans for OR soon. Followed by [facility]  Hx carotid stenosis s/p R CEA 2009 Amedeo Plenty)  PAD s/p Aorta-B fem artery BPG 2016 a/ aortic renal bypass  Other Active Problems  Jaw pain, followed by dentist without resolution, teleneuro recommended consideration of Trigeminal Neuralgia treatment -topamax trial   ESRD on HD TTS  BPH  COPD  Hospital day # 1  He presented with right-sided weakness and ataxia is due to small left internal capsule hemorrhagic infarct likely from small vessel disease with hemorrhagic transformation due to being on anticoagulation with warfarin.  INR has been reversed and is optimal 1.4  today.  He unfortunately remains at risk for recurrent strokes and may need to start aspirin in a few days prior to discharge and warfarin in 2 to 4 weeks.  Repeat CT scan of the head without contrast tomorrow and check carotid ultrasound. Start Topamax 50 mg twice daily for his neuropathic right mandibular pain.  If not effective may consider switching to Tegretol or Lyrica in the future.  Long discussion patient and wife and answered questions.  Discussed with Dr. Carlena Hurl  Greater than 50% time during this 80-minute consultation visit was spent on counseling and coordination of care about his hemorrhagic stroke and mandibular neuropathic pain and discussion about evaluation and treatment and answering questions.  Thank you for this consultation and allowing [place] to participate in the care of this patient.  Antony Contras, MD  To contact Stroke Continuity provider, please refer to [email or web address] After hours, contact General Neurology

## 2020-02-07 ENCOUNTER — Inpatient Hospital Stay (HOSPITAL_COMMUNITY): Payer: No Typology Code available for payment source

## 2020-02-07 ENCOUNTER — Telehealth: Payer: Self-pay | Admitting: Internal Medicine

## 2020-02-07 LAB — CBC
HCT: 36.2 % — ABNORMAL LOW (ref 39.0–52.0)
Hemoglobin: 11.4 g/dL — ABNORMAL LOW (ref 13.0–17.0)
MCH: 31 pg (ref 26.0–34.0)
MCHC: 31.5 g/dL (ref 30.0–36.0)
MCV: 98.4 fL (ref 80.0–100.0)
Platelets: 258 10*3/uL (ref 150–400)
RBC: 3.68 MIL/uL — ABNORMAL LOW (ref 4.22–5.81)
RDW: 16.1 % — ABNORMAL HIGH (ref 11.5–15.5)
WBC: 7.9 10*3/uL (ref 4.0–10.5)
nRBC: 0 % (ref 0.0–0.2)

## 2020-02-07 LAB — BASIC METABOLIC PANEL
Anion gap: 10 (ref 5–15)
BUN: 28 mg/dL — ABNORMAL HIGH (ref 8–23)
CO2: 26 mmol/L (ref 22–32)
Calcium: 9.7 mg/dL (ref 8.9–10.3)
Chloride: 100 mmol/L (ref 98–111)
Creatinine, Ser: 3.4 mg/dL — ABNORMAL HIGH (ref 0.61–1.24)
GFR calc Af Amer: 20 mL/min — ABNORMAL LOW (ref >60)
GFR calc non Af Amer: 17 mL/min — ABNORMAL LOW (ref >60)
Glucose, Bld: 109 mg/dL — ABNORMAL HIGH (ref 70–99)
Potassium: 3.9 mmol/L (ref 3.5–5.1)
Sodium: 136 mmol/L (ref 135–145)

## 2020-02-07 MED ORDER — LIDOCAINE HCL (PF) 1 % IJ SOLN: 5.0000 mL | INTRAMUSCULAR | Status: DC | PRN

## 2020-02-07 MED ORDER — HYDROCODONE-ACETAMINOPHEN 5-325 MG PO TABS
ORAL_TABLET | ORAL | Status: AC
  Filled 2020-02-07: qty 1

## 2020-02-07 MED ORDER — ASPIRIN EC 81 MG PO TBEC
81.0000 mg | DELAYED_RELEASE_TABLET | Freq: Every day | ORAL | Status: DC
  Administered 2020-02-07: 81 mg via ORAL
  Filled 2020-02-07: qty 1

## 2020-02-07 MED ORDER — TOPIRAMATE 50 MG PO TABS: 50.0000 mg | ORAL_TABLET | Freq: Two times a day (BID) | ORAL | 1 refills | Status: AC

## 2020-02-07 MED ORDER — ASPIRIN 81 MG PO TBEC: 81.0000 mg | DELAYED_RELEASE_TABLET | Freq: Every day | ORAL | 0 refills | Status: AC

## 2020-02-07 MED ORDER — IPRATROPIUM-ALBUTEROL 0.5-2.5 (3) MG/3ML IN SOLN
3.0000 mL | Freq: Two times a day (BID) | RESPIRATORY_TRACT | Status: DC
  Filled 2020-02-07: qty 3

## 2020-02-07 MED ORDER — WARFARIN SODIUM 5 MG PO TABS: ORAL_TABLET | ORAL | 4 refills | Status: AC

## 2020-02-07 MED ORDER — SODIUM CHLORIDE 0.9 % IV SOLN: 100.0000 mL | INTRAVENOUS | Status: DC | PRN

## 2020-02-07 MED ORDER — DOXERCALCIFEROL 4 MCG/2ML IV SOLN
INTRAVENOUS | Status: AC
  Administered 2020-02-07: 2.5 ug via INTRAVENOUS
  Filled 2020-02-07: qty 2

## 2020-02-07 MED ORDER — LIDOCAINE-PRILOCAINE 2.5-2.5 % EX CREA: 1.0000 "application " | TOPICAL_CREAM | CUTANEOUS | Status: DC | PRN

## 2020-02-07 MED ORDER — PENTAFLUOROPROP-TETRAFLUOROETH EX AERO: 1.0000 "application " | INHALATION_SPRAY | CUTANEOUS | Status: DC | PRN

## 2020-02-07 NOTE — Progress Notes (Signed)
PT Cancellation Note  Patient Details Name: Barry Lawrence MRN: [id number] DOB: February 07, 1946   Cancelled Treatment:    Reason Eval/Treat Not Completed: Patient at procedure or test/unavailable. Pt off unit for HD this morning. Checked on pt this afternoon and he is off the unit again. Will continue to follow.    Thelma Comp 02/07/2020, 2:35 PM   Rolinda Roan, PT, DPT Acute Rehabilitation Services Pager: [phone] Office: [phone]

## 2020-02-07 NOTE — TOC Transition Note (Signed)
Transition of Care [facility]) - CM/SW Discharge Note   Patient Details  Name: Barry Lawrence MRN: [id number] Date of Birth: [birth year]  Transition of Care [facility]) CM/SW Contact:  Pollie Friar, RN Phone Number: 02/07/2020, 3:59 PM   Clinical Narrative:    Pt discharging home with outpatient therapy to be arranged through the [facility]. CM has faxed them all the needed information and they will arrange the therapy once cleared by pts PCP at the [place].  Information for rollator also sent to the [place] and they will have the DME delivered to his home.  Pt has transportation home.    Final next level of care: OP Rehab Barriers to Discharge: No Barriers Identified   Patient Goals and CMS Choice   CMS Medicare.gov Compare Post Acute Care list provided to:: Patient Choice offered to / list presented to : Patient  Discharge Placement                       Discharge Plan and Services   Discharge Planning Services: CM Consult Post Acute Care Choice: Durable Medical Equipment          DME Arranged: Walker rolling with seat   Date DME Agency Contacted: 02/06/20   Representative spoke with at DME Agency: [place] Interventions     Readmission Risk Interventions No flowsheet data found.

## 2020-02-07 NOTE — Discharge Summary (Signed)
[facility]  Physician Discharge Summary   Patient ID: ESTILL LLERENA MRN: [id number] DOB/AGE: 73/03/1946 73 y.o.  Admit date: 02/05/2020 Discharge date: 02/07/2020  PCP: Blane Ohara, MD  DISCHARGE DIAGNOSES:  Acute hemorrhagic stroke History of permanent atrial fibrillation on warfarin End-stage renal disease on hemodialysis History of coronary artery disease Peripheral artery disease Essential hypertension BPH History of COPD Normocytic anemia Trigeminal neuralgia  RECOMMENDATIONS FOR OUTPATIENT FOLLOW UP: 1. Outpatient follow-up with neurology in the next few weeks 2. Patient to take aspirin for the next 2 weeks and hold his warfarin 3. Resume warfarin after 2 weeks 4. Outpatient PT and OT    Home Health: None Equipment/Devices: None  CODE STATUS: Full code  DISCHARGE CONDITION: fair  Diet recommendation: Heart healthy  INITIAL HISTORY: 74 y.o.malewith medical history significant forESRD on HD TTS, AAA, CAD, PAD, BPH, hypertension, dyslipidemia, COPD with active tobacco use, and atrial fibrillation on warfarin who presented to the ED with complaints of right-sided weakness that began shortly after dental appointment after he had a tooth extraction.  Patient was noted to have hemorrhagic CVA on MRI brain.  Patient was transferred to [facility] for further management.  Consultations:  Neurology  Nephrology  Procedures:  None   HOSPITAL COURSE:   Acute hemorrhagic infarct  Noted to be in the posterior limb of the internal capsule.  Patient's INR was 3.2 at the time of admission.  It was reversed using vitamin K and FFP.  Patient has some weakness on the left side.  Patient seen by telemetry neurology when he was at [facility] hospital.   Seen by neurology.  CT head was repeated today which showed stable findings.  Patient had minimal deficits.  Seen by PT and OT.  Outpatient therapy is recommended Patient on Lipitor.  LDL noted to be 30. HbA1c  5.5.   Patient underwent echocardiogram as well.  Normal EF noted. No significant stenosis noted on carotid ultrasound. Discussed with the stroke team.  Plan is to hold warfarin for 2 weeks.  Aspirin to be given in the interim for the duration.  Warfarin to be resumed after 2 weeks.    Jaw pain/concern for trigeminal neuralgia Patient has been experiencing right jaw pain for several weeks.  He was seen by his dentist.  He underwent tooth extraction without any relief.  It appears that patient may actually be experiencing trigeminal neuralgia.  Started on Topamax by neurology.  Follow-up with neurology for same.  History of permanent atrial fibrillation on warfarin Warfarin had to be reversed due to hemorrhagic infarct.  INR 1.4 this morning.  Patient to resume warfarin after 2 weeks.  End-stage renal disease on hemodialysis on Tuesday Thursday Saturday Patient was seen by nephrology and was dialyzed while he was in the hospital.  He may resume his usual schedule at discharge.  History of coronary artery disease/peripheral artery disease Stable.  Continue statin.  Essential hypertension May resume home medications  History of BPH Continue with Flomax.  History of COPD/tobacco abuse Continues to smoke.  He was counseled.  He was wheezing yesterday.  He was given inhalers with improvement.  History of AAA Followed by vascular surgery.  Denies any abdominal pain currently.  Normocytic anemia No evidence of overt blood loss.  Monitor hemoglobin.  Of note patient was recently found to have a left apical pulmonary nodule highly suspicious for malignancy on PET scan.  Pulmonology is following and he has been referred to oncology.  There was also  nonspecific prostate activity noted on the PET scan.  To be addressed by oncology and primary care providers.   Overall stable.  Okay for discharge today.   PERTINENT LABS:  The results of significant diagnostics from this  hospitalization (including imaging, microbiology, ancillary and laboratory) are listed below for reference.    Microbiology: Recent Results (from the past 240 hour(s))  SARS Coronavirus 2 by RT PCR (hospital order, performed in [facility] hospital lab) Nasopharyngeal Nasopharyngeal Swab     Status: None   Collection Time: 02/05/20  2:50 PM   Specimen: Nasopharyngeal Swab  Result Value Ref Range Status   SARS Coronavirus 2 NEGATIVE NEGATIVE Final    Comment: (NOTE) SARS-CoV-2 target nucleic acids are NOT DETECTED.  The SARS-CoV-2 RNA is generally detectable in upper and lower respiratory specimens during the acute phase of infection. The lowest concentration of SARS-CoV-2 viral copies this assay can detect is 250 copies / mL. A negative result does not preclude SARS-CoV-2 infection and should not be used as the sole basis for treatment or other patient management decisions.  A negative result may occur with improper specimen collection / handling, submission of specimen other than nasopharyngeal swab, presence of viral mutation(s) within the areas targeted by this assay, and inadequate number of viral copies (<250 copies / mL). A negative result must be combined with clinical observations, patient history, and epidemiological information.  Fact Sheet for Patients:   StrictlyIdeas.no  Fact Sheet for Healthcare Providers: BankingDealers.co.za  This test is not yet approved or  cleared by the [place] FDA and has been authorized for detection and/or diagnosis of SARS-CoV-2 by FDA under an Emergency Use Authorization (EUA).  This EUA will remain in effect (meaning this test can be used) for the duration of the COVID-19 declaration under Section 564(b)(1) of the Act, 21 U.S.C. section 360bbb-3(b)(1), unless the authorization is terminated or revoked sooner.  Performed at [facility], [address] [place], [place] [address]       Labs:     Basic Metabolic Panel: Recent Labs  Lab 02/05/20 0839 02/06/20 0152 02/06/20 1016 02/07/20 0813  NA 135 133*  --  136  K 4.5 4.8  --  3.9  CL 99 98  --  100  CO2 25 26  --  26  GLUCOSE 100* 107*  --  109*  BUN 51* 53*  --  28*  CREATININE 4.83* 4.79*  --  3.40*  CALCIUM 9.1 9.5  --  9.7  MG  --  3.1*  --   --   PHOS  --   --  4.1  --    Liver Function Tests: Recent Labs  Lab 02/05/20 0839  AST 13*  ALT 14  ALKPHOS 91  BILITOT 0.4  PROT 6.4*  ALBUMIN 3.5   CBC: Recent Labs  Lab 02/05/20 0839 02/06/20 0152 02/07/20 0813  WBC 9.5 7.8 7.9  NEUTROABS 7.4  --   --   HGB 13.1 11.4* 11.4*  HCT 42.0 36.3* 36.2*  MCV 100.5* 98.9 98.4  PLT 281 250 258     IMAGING STUDIES DG Chest 2 View  Result Date: 01/13/2020 CLINICAL DATA:  Chest pain and epigastric pain. EXAM: CHEST - 2 VIEW COMPARISON:  Chest plain film, dated August 06, 2018, and prior chest CT dated April 18, 2019. FINDINGS: Multiple sternal wires and vascular clips are seen. Mild diffuse chronic appearing increased interstitial lung markings are present. A small ill-defined focal opacity is seen overlying the left  apex. This is increased in size when compared to the prior exam. A noncalcified lung nodule is seen within this region on the prior chest CT. There is no evidence of a pleural effusion or pneumothorax. The cardiac silhouette is markedly enlarged. There is moderate severity calcification and tortuosity of the thoracic aorta. The visualized skeletal structures are unremarkable. IMPRESSION: Chronic appearing increased interstitial lung markings with a small ill-defined focal opacity overlying the left apex. This may represent interval enlargement of the left apical lung nodule seen on the prior chest CT. Correlation with follow-up chest CT is recommended. Electronically Signed   By: Virgina Norfolk M.D.   On: 01/13/2020 18:26   CT HEAD WO CONTRAST  Result Date: 02/07/2020 CLINICAL DATA:   74 year old male with right side weakness, small acute lacunar infarct of the left internal capsule on MRI 2 days ago. EXAM: CT HEAD WITHOUT CONTRAST TECHNIQUE: Contiguous axial images were obtained from the base of the skull through the vertex without intravenous contrast. COMPARISON:  Brain MRI and MRA 02/05/2020.  Head CT 02/05/2020. FINDINGS: Brain: Unchanged punctate hyperdense lesion of the posterior limb left internal capsule corresponding to the diffusion abnormality on MRI, small hemorrhagic lacune. No mass effect or regional edema. No new intracranial hemorrhage identified. Stable gray-white matter differentiation elsewhere. No acute cortically based infarct identified. Vascular: Extensive Calcified atherosclerosis at the skull base. No suspicious intracranial vascular hyperdensity. Skull: No acute osseous abnormality identified. Sinuses/Orbits: Visualized paranasal sinuses and mastoids are stable and well pneumatized. Other: No acute orbit or scalp soft tissue findings. IMPRESSION: 1. Unchanged small hemorrhagic lacune in the posterior limb left internal capsule. No associated mass effect or edema. 2. No new intracranial abnormality. Electronically Signed   By: Genevie Ann M.D.   On: 02/07/2020 14:55   MR ANGIO HEAD WO CONTRAST  Result Date: 02/05/2020 CLINICAL DATA:  Stroke.  Right-sided weakness. EXAM: MRI HEAD WITHOUT CONTRAST MRA HEAD WITHOUT CONTRAST TECHNIQUE: Multiplanar, multiecho pulse sequences of the brain and surrounding structures were obtained without intravenous contrast. Angiographic images of the head were obtained using MRA technique without contrast. COMPARISON:  CT head 02/05/2020 FINDINGS: MRI HEAD FINDINGS Brain: Small acute infarct posterior limb internal capsule with associated small amount of hemorrhage. This corresponds to the area on CT. No other areas of acute infarct. Scattered periventricular white matter hyperintensities bilaterally compatible with chronic microvascular  ischemia. Negative for mass or hydrocephalus. Vascular: Normal arterial flow voids. Skull and upper cervical spine: Negative Sinuses/Orbits: Paranasal sinuses clear.  Negative orbit Other: Motion degraded study MRA HEAD FINDINGS Motion degraded study. Right vertebral dominant and patent to the basilar. Right PICA patent. Left vertebral artery ends in PICA. Basilar patent without stenosis. Fetal origin of the posterior cerebral artery bilaterally. Moderate stenosis left P2 segment. Right posterior cerebral artery patent. Internal carotid artery patent bilaterally without stenosis. Anterior and middle cerebral arteries widely patent bilaterally. IMPRESSION: 1. Small acute hemorrhagic infarct posterior limb internal capsule, corresponding to the abnormality on CT. No other acute infarct 2. Chronic microvascular ischemic change in the white matter 3. MRA degraded by motion. Moderate stenosis left P2 segment. No intracranial large vessel occlusion. Electronically Signed   By: Franchot Gallo M.D.   On: 02/05/2020 12:10   MR BRAIN WO CONTRAST  Result Date: 02/05/2020 CLINICAL DATA:  Stroke.  Right-sided weakness. EXAM: MRI HEAD WITHOUT CONTRAST MRA HEAD WITHOUT CONTRAST TECHNIQUE: Multiplanar, multiecho pulse sequences of the brain and surrounding structures were obtained without intravenous contrast. Angiographic images of the head were  obtained using MRA technique without contrast. COMPARISON:  CT head 02/05/2020 FINDINGS: MRI HEAD FINDINGS Brain: Small acute infarct posterior limb internal capsule with associated small amount of hemorrhage. This corresponds to the area on CT. No other areas of acute infarct. Scattered periventricular white matter hyperintensities bilaterally compatible with chronic microvascular ischemia. Negative for mass or hydrocephalus. Vascular: Normal arterial flow voids. Skull and upper cervical spine: Negative Sinuses/Orbits: Paranasal sinuses clear.  Negative orbit Other: Motion degraded  study MRA HEAD FINDINGS Motion degraded study. Right vertebral dominant and patent to the basilar. Right PICA patent. Left vertebral artery ends in PICA. Basilar patent without stenosis. Fetal origin of the posterior cerebral artery bilaterally. Moderate stenosis left P2 segment. Right posterior cerebral artery patent. Internal carotid artery patent bilaterally without stenosis. Anterior and middle cerebral arteries widely patent bilaterally. IMPRESSION: 1. Small acute hemorrhagic infarct posterior limb internal capsule, corresponding to the abnormality on CT. No other acute infarct 2. Chronic microvascular ischemic change in the white matter 3. MRA degraded by motion. Moderate stenosis left P2 segment. No intracranial large vessel occlusion. Electronically Signed   By: Franchot Gallo M.D.   On: 02/05/2020 12:10   ECHOCARDIOGRAM COMPLETE BUBBLE STUDY  Result Date: 02/06/2020    ECHOCARDIOGRAM REPORT   Patient Name:   EDSON DERIDDER Date of Exam: 02/06/2020 Medical Rec #:  [id number]       Height:       67.0 in Accession #:    [id number]      Weight:       174.5 lb Date of Birth:  [birth year]        BSA:          1.908 m Patient Age:    30 years        BP:           139/77 mmHg Patient Gender: M               HR:           62 bpm. Exam Location:  Inpatient Procedure: 2D Echo, Cardiac Doppler and Color Doppler Indications:     Stroke  History:         Patient has prior history of Echocardiogram examinations. CHF,                  CAD, Abnormal ECG and Prior CABG, COPD, Signs/Symptoms:Dyspnea;                  Risk Factors:Hypertension, Dyslipidemia, Current Smoker and                  Sleep Apnea.  Sonographer:     Roseanna Rainbow RDCS Referring Phys:  Sanderson Diagnosing Phys: Mertie Moores MD IMPRESSIONS  1. Left ventricular ejection fraction, by estimation, is 60 to 65%. The left ventricle has normal function. The left ventricle has no regional wall motion abnormalities. There is moderate left ventricular  hypertrophy. Left ventricular diastolic function  could not be evaluated.  2. Right ventricular systolic function is normal. The right ventricular size is normal. There is moderately elevated pulmonary artery systolic pressure.  3. Left atrial size was severely dilated.  4. The mitral valve is normal in structure. Mild mitral valve regurgitation. Mild mitral stenosis.  5. Tricuspid valve regurgitation is moderate.  6. The aortic valve is normal in structure. Aortic valve regurgitation is mild. No aortic stenosis is present.  7. Aortic dilatation noted. There is moderate dilatation of the ascending  aorta, measuring 41 mm.  8. The inferior vena cava is dilated in size with <50% respiratory variability, suggesting right atrial pressure of 15 mmHg. FINDINGS  Left Ventricle: Left ventricular ejection fraction, by estimation, is 60 to 65%. The left ventricle has normal function. The left ventricle has no regional wall motion abnormalities. The left ventricular internal cavity size was normal in size. There is  moderate left ventricular hypertrophy. Left ventricular diastolic function could not be evaluated due to atrial fibrillation. Left ventricular diastolic function could not be evaluated. Right Ventricle: The right ventricular size is normal. No increase in right ventricular wall thickness. Right ventricular systolic function is normal. There is moderately elevated pulmonary artery systolic pressure. The tricuspid regurgitant velocity is 3.39 m/s, and with an assumed right atrial pressure of 15 mmHg, the estimated right ventricular systolic pressure is 50.9 mmHg. Left Atrium: Left atrial size was severely dilated. Right Atrium: Right atrial size was normal in size. Pericardium: There is no evidence of pericardial effusion. Mitral Valve: The mitral valve is normal in structure. Mild mitral valve regurgitation. Mild mitral valve stenosis. MV peak gradient, 9.9 mmHg. The mean mitral valve gradient is 3.0 mmHg. Tricuspid  Valve: The tricuspid valve is grossly normal. Tricuspid valve regurgitation is moderate. Aortic Valve: The aortic valve is normal in structure. Aortic valve regurgitation is mild. Aortic regurgitation PHT measures 445 msec. No aortic stenosis is present. Aortic valve mean gradient measures 5.0 mmHg. Aortic valve peak gradient measures 12.8 mmHg. Aortic valve area, by VTI measures 2.33 cm. Pulmonic Valve: The pulmonic valve was normal in structure. Pulmonic valve regurgitation is not visualized. No evidence of pulmonic stenosis. Aorta: Aortic dilatation noted. There is moderate dilatation of the ascending aorta, measuring 41 mm. Venous: The inferior vena cava is dilated in size with less than 50% respiratory variability, suggesting right atrial pressure of 15 mmHg. IAS/Shunts: The atrial septum is grossly normal. Agitated saline contrast was given intravenously to evaluate for intracardiac shunting.  LEFT VENTRICLE PLAX 2D LVIDd:         5.10 cm      Diastology LVIDs:         3.40 cm      LV e' medial:    7.51 cm/s LV PW:         1.70 cm      LV E/e' medial:  19.6 LV IVS:        1.40 cm      LV e' lateral:   9.25 cm/s LVOT diam:     2.20 cm      LV E/e' lateral: 15.9 LV SV:         73 LV SV Index:   38 LVOT Area:     3.80 cm  LV Volumes (MOD) LV vol d, MOD A2C: 99.2 ml LV vol d, MOD A4C: 113.0 ml LV vol s, MOD A2C: 52.0 ml LV vol s, MOD A4C: 33.7 ml LV SV MOD A2C:     47.2 ml LV SV MOD A4C:     113.0 ml LV SV MOD BP:      67.0 ml RIGHT VENTRICLE            IVC RV S prime:     7.83 cm/s  IVC diam: 2.90 cm TAPSE (M-mode): 1.4 cm LEFT ATRIUM              Index       RIGHT ATRIUM           Index  LA diam:        4.30 cm  2.25 cm/m  RA Area:     17.00 cm LA Vol (A2C):   116.0 ml 60.79 ml/m RA Volume:   45.50 ml  23.84 ml/m LA Vol (A4C):   70.3 ml  36.84 ml/m LA Biplane Vol: 97.6 ml  51.14 ml/m  AORTIC VALVE                   PULMONIC VALVE AV Area (Vmax):    2.53 cm    PR End Diast Vel: 2.71 msec AV Area (Vmean):    2.27 cm AV Area (VTI):     2.33 cm AV Vmax:           179.00 cm/s AV Vmean:          99.100 cm/s AV VTI:            0.312 m AV Peak Grad:      12.8 mmHg AV Mean Grad:      5.0 mmHg LVOT Vmax:         119.00 cm/s LVOT Vmean:        59.300 cm/s LVOT VTI:          0.191 m LVOT/AV VTI ratio: 0.61 AI PHT:            445 msec  AORTA Ao Root diam: 3.70 cm Ao Asc diam:  4.10 cm MITRAL VALVE                TRICUSPID VALVE MV Area (PHT): 4.89 cm     TR Peak grad:   46.0 mmHg MV Peak grad:  9.9 mmHg     TR Vmax:        339.00 cm/s MV Mean grad:  3.0 mmHg MV Vmax:       1.57 m/s     SHUNTS MV Vmean:      67.6 cm/s    Systemic VTI:  0.19 m MV Decel Time: 155 msec     Systemic Diam: 2.20 cm MV E velocity: 147.00 cm/s Mertie Moores MD Electronically signed by Mertie Moores MD Signature Date/Time: 02/06/2020/2:42:43 PM    Final (Updated)    CT HEAD CODE STROKE WO CONTRAST  Result Date: 02/05/2020 CLINICAL DATA:  Code stroke.  Right arm and leg weakness EXAM: CT HEAD WITHOUT CONTRAST TECHNIQUE: Contiguous axial images were obtained from the base of the skull through the vertex without intravenous contrast. COMPARISON:  None. FINDINGS: Brain: Age related volume loss. Mild moderate chronic small-vessel change of the hemispheric white matter. 4-5 mm hyperdensity (low level) in the posterior limb internal capsule on the left that could represent a tiny recent hemorrhagic infarction. No sign of acute large vessel territory insult at this time. No mass, hydrocephalus or extra-axial collection. Vascular: There is atherosclerotic calcification of the major vessels at the base of the brain. Skull: Negative Sinuses/Orbits: Clear/normal Other: None ASPECTS ([place] Stroke Program Early CT Score) - Ganglionic level infarction (caudate, lentiform nuclei, internal capsule, insula, M1-M3 cortex): 6 - Supraganglionic infarction (M4-M6 cortex): 3 Total score (0-10 with 10 being normal): 9 IMPRESSION: 1. 4-5 mm hyperdensity in the posterior  limb internal capsule on the left that could represent a tiny recent hemorrhagic infarction. No sign of acute large vessel territory insult at this time. 2. ASPECTS is 9. 3. These results were called by telephone at the time of interpretation on 02/05/2020 at 8:41 am to provider [facility] ZAMMIT , who verbally acknowledged these  results. Electronically Signed   By: Nelson Chimes M.D.   On: 02/05/2020 08:43   VAS US CAROTID  Result Date: 02/06/2020 Carotid Arterial Duplex Study Indications:  TIA and CVA. Risk Factors: Hypertension, hyperlipidemia, Diabetes, prior CVA. Performing Technologist: Griffin Basil RCT RDMS  Examination Guidelines: A complete evaluation includes B-mode imaging, spectral Doppler, color Doppler, and power Doppler as needed of all accessible portions of each vessel. Bilateral testing is considered an integral part of a complete examination. Limited examinations for reoccurring indications may be performed as noted.  Right Carotid Findings: +----------+--------+--------+--------+------------------+------------------+           PSV cm/sEDV cm/sStenosisPlaque DescriptionComments           +----------+--------+--------+--------+------------------+------------------+ CCA Prox  83      12                                                   +----------+--------+--------+--------+------------------+------------------+ CCA Distal108     17                                intimal thickening +----------+--------+--------+--------+------------------+------------------+ ICA Prox  107     23      1-39%   calcific          intimal thickening +----------+--------+--------+--------+------------------+------------------+ ICA Distal126     24                                                   +----------+--------+--------+--------+------------------+------------------+ ECA       170     19                                                    +----------+--------+--------+--------+------------------+------------------+ +----------+--------+-------+--------+-------------------+           PSV cm/sEDV cmsDescribeArm Pressure (mmHG) +----------+--------+-------+--------+-------------------+ [id number]     45                                 +----------+--------+-------+--------+-------------------+ +---------+--------+--+--------+--+ VertebralPSV cm/s81EDV cm/s15 +---------+--------+--+--------+--+  Left Carotid Findings: +----------+--------+--------+--------+------------------+------------------+           PSV cm/sEDV cm/sStenosisPlaque DescriptionComments           +----------+--------+--------+--------+------------------+------------------+ CCA Prox  71      14                                                   +----------+--------+--------+--------+------------------+------------------+ CCA Distal59      14                                intimal thickening +----------+--------+--------+--------+------------------+------------------+ ICA Prox  102     15      1-39%   calcific          intimal thickening +----------+--------+--------+--------+------------------+------------------+  ICA Distal100     20                                                   +----------+--------+--------+--------+------------------+------------------+ ECA       543             >50%    calcific                             +----------+--------+--------+--------+------------------+------------------+ +---------+--------+--+--------+--+ VertebralPSV cm/s70EDV cm/s15 +---------+--------+--+--------+--+   Summary: Right Carotid: Velocities in the right ICA are consistent with a 1-39% stenosis. Left Carotid: Velocities in the left ICA are consistent with a 1-39% stenosis.               The ECA appears >50% stenosed. Vertebrals: Bilateral vertebral arteries demonstrate antegrade flow. *See table(s) above for measurements  and observations.  Electronically signed by Harold Barban MD on 02/06/2020 at 5:20:43 PM.    Final     DISCHARGE EXAMINATION: Vitals:   02/07/20 1100 02/07/20 1130 02/07/20 1207 02/07/20 1210  BP: (!) 164/84 134/68 (!) 164/85 (!) 190/93  Pulse: 66 65 75 77  Resp:  14 18   Temp:    97.7 F (36.5 C)  TempSrc:    Axillary  SpO2:   98%   Weight:   72.7 kg   Height:       General appearance: Awake alert.  In no distress Resp: Clear to auscultation bilaterally.  Normal effort Cardio: S1-S2 is normal regular.  No S3-S4.  No rubs murmurs or bruit GI: Abdomen is soft.  Nontender nondistended.  Bowel sounds are present normal.  No masses organomegaly     DISPOSITION: Home  Discharge Instructions    Ambulatory referral to Neurology   Complete by: As directed    An appointment is requested in approximately: 4 weeks for stroke and trigeminal neuralgia   Ambulatory referral to Occupational Therapy   Complete by: As directed    Ambulatory referral to Physical Therapy   Complete by: As directed    Call MD for:  difficulty breathing, headache or visual disturbances   Complete by: As directed    Call MD for:  extreme fatigue   Complete by: As directed    Call MD for:  persistant dizziness or light-headedness   Complete by: As directed    Call MD for:  persistant nausea and vomiting   Complete by: As directed    Call MD for:  severe uncontrolled pain   Complete by: As directed    Call MD for:  temperature >100.4   Complete by: As directed    Diet - low sodium heart healthy   Complete by: As directed    Discharge instructions   Complete by: As directed    Please take your medications as prescribed.  Stop your warfarin for now and resume after 2 weeks on February 21, 2020.  In the interim take baby aspirin for 2 weeks.  Follow-up follow-up with your outpatient primary care provider.  You were cared for by a hospitalist during your hospital stay. If you have any questions about your  discharge medications or the care you received while you were in the hospital after you are discharged, you can call the unit and asked to speak with the hospitalist on  call if the hospitalist that took care of you is not available. Once you are discharged, your primary care physician will handle any further medical issues. Please note that NO REFILLS for any discharge medications will be authorized once you are discharged, as it is imperative that you return to your primary care physician (or establish a relationship with a primary care physician if you do not have one) for your aftercare needs so that they can reassess your need for medications and monitor your lab values. If you do not have a primary care physician, you can call [phone] for a physician referral.   Increase activity slowly   Complete by: As directed    No wound care   Complete by: As directed          Allergies as of 02/07/2020      Reactions   Carvedilol Other (See Comments)   unknown   Metolazone Other (See Comments)   Pt says had MI when took   Niaspan [niacin Er] Hives      Medication List    TAKE these medications   albuterol 0.63 MG/3ML nebulizer solution Commonly known as: ACCUNEB Take 1 ampule by nebulization every 6 (six) hours as needed for wheezing.   amLODipine 5 MG tablet Commonly known as: NORVASC Take 5 mg by mouth See admin instructions. Take 5 mg on Mon, Wed, Fri, and Sunday.   aspirin 81 MG EC tablet Take 1 tablet (81 mg total) by mouth daily for 14 days. Swallow whole.   atorvastatin 80 MG tablet Commonly known as: LIPITOR Take 80 mg by mouth at bedtime.   clindamycin 150 MG capsule Commonly known as: CLEOCIN Take 150 mg by mouth in the morning and at bedtime.   famotidine 20 MG tablet Commonly known as: PEPCID Take 20 mg by mouth daily.   ferric citrate 1 GM 210 MG(Fe) tablet Commonly known as: AURYXIA Take 210 mg by mouth 3 (three) times daily with meals.   finasteride 5 MG  tablet Commonly known as: PROSCAR Take 5 mg by mouth daily.   furosemide 80 MG tablet Commonly known as: LASIX Take 2 tablets (160 mg total) by mouth 2 (two) times daily. What changed:   when to take this  additional instructions   oxyCODONE 5 MG immediate release tablet Commonly known as: Oxy IR/ROXICODONE Take 5 mg by mouth every 4 (four) hours as needed for severe pain.   tamsulosin 0.4 MG Caps capsule Commonly known as: FLOMAX Take 0.4 mg by mouth in the morning and at bedtime.   topiramate 50 MG tablet Commonly known as: TOPAMAX Take 1 tablet (50 mg total) by mouth 2 (two) times daily.   warfarin 5 MG tablet Commonly known as: COUMADIN PLEASE RESUME AFTER 2 WEEKS ON 02/21/20: Take 1 tablet daily except 1/2 tablet on Mondays, Wednesdays and Fridays or as directed Start taking on: February 21, 2020 What changed:   additional instructions  These instructions start on February 21, 2020. If you are unsure what to do until then, ask your doctor or other care provider.         Follow-up Information    Aros, Howis Y, MD. Schedule an appointment as soon as possible for a visit in 1 week(s).   Specialty: Family Medicine Contact information: [place] [address] [phone]               TOTAL DISCHARGE TIME: 83 minutes  [place]  [facility] Pager on [email or web address]  02/07/2020, 3:06 PM

## 2020-02-07 NOTE — Telephone Encounter (Signed)
Received a new pt referral from Dr. Melvyn Novas for lung cancer. [name] has been scheduled to see Dr. Julien Nordmann on 9/27 at 2;15pm w/labs at 1:45pm. I lft the appt date and time on the pt's vm. Letter mailed.

## 2020-02-07 NOTE — Progress Notes (Signed)
Nephrology Follow-Up Consult note   Assessment/Recommendations: Barry Lawrence is a/an 74 y.o. male with a past medical history notable for ESRD on HD admitted with hemorrhagic stroke.   # ESRD:  Outpatient prescription of K3, calcium 2.5, sodium 138, bicarb 35. Underwent dialysis yesterday with no issues. Dialysis again today and then likely discharge thereafter # Volume/ hypertension: EDW 76kg. Patient is under his dry weight. Attempting to remove 2 L of fluid today. Blood pressure slightly elevated. Continue home Lasix. # Anemia of Chronic Kidney Disease: Hemoglobin  greater than 10 no ESA needed # Secondary Hyperparathyroidism/Hyperphosphatemia:  Continue Hectorol 2.5 MCG with each treatment. # Vascular access: AVF with no issues # Hospital problem: Hemorrhagic CVA: holding warfarin. Minimal deficits.  likely to discharge today  # Additional recommendations: - Dose all meds for creatinine clearance <10 ml/min  - Unless absolutely necessary, no MRIs with gadolinium.  - Implement save arm precautions. Prefer needle sticks in the dorsum of the hands or wrists. No blood pressure measurements in arm. - If blood transfusion is requested during hemodialysis sessions, please alert [place] prior to the session.   Recommendations were discussed with the primary team.    Recommendations conveyed to primary service.    [facility] 02/07/2020 11:45 AM  ___________________________________________________________  CC: ESRD  Interval History/Subjective: Patient feels well today without any complaints. States that he does not think he has extensive volume overload at this time. Patient is below dry weight. Tolerated dialysis yesterday without significant issues.   Medications:  Current Facility-Administered Medications  Medication Dose Route Frequency Provider Last Rate Last Admin  . 0.9 %  sodium chloride infusion (Manually program via Guardrails IV  Fluids)   Intravenous Once Manuella Ghazi, Pratik D, DO      . 0.9 %  sodium chloride infusion  250 mL Intravenous PRN Manuella Ghazi, Pratik D, DO      . acetaminophen (TYLENOL) tablet 650 mg  650 mg Oral Q6H PRN Heath Lark D, DO   650 mg at 02/07/20 0539   Or  . acetaminophen (TYLENOL) suppository 650 mg  650 mg Rectal Q6H PRN Manuella Ghazi, Pratik D, DO      . atorvastatin (LIPITOR) tablet 80 mg  80 mg Oral QHS Shah, Pratik D, DO   80 mg at 02/06/20 2107  . Chlorhexidine Gluconate Cloth 2 % PADS 6 each  6 each Topical Q0600 Reesa Chew, MD   6 each at 02/07/20 [phone]  . doxercalciferol (HECTOROL) 4 MCG/2ML injection           . doxercalciferol (HECTOROL) injection 2.5 mcg  2.5 mcg Intravenous Q T,Th,Sa-HD Reesa Chew, MD   2.5 mcg at 02/07/20 1114  . famotidine (PEPCID) tablet 20 mg  20 mg Oral Daily Manuella Ghazi, Pratik D, DO   20 mg at 02/06/20 0959  . ferric citrate (AURYXIA) tablet 210 mg  210 mg Oral TID WC Shah, Pratik D, DO   210 mg at 02/06/20 0820  . finasteride (PROSCAR) tablet 5 mg  5 mg Oral Daily Manuella Ghazi, Pratik D, DO   5 mg at 02/06/20 0959  . furosemide (LASIX) tablet 180 mg  180 mg Oral BID Reesa Chew, MD   180 mg at 02/06/20 1924  . HYDROcodone-acetaminophen (NORCO/VICODIN) 5-325 MG per tablet 1 tablet  1 tablet Oral Q4H PRN Shalhoub, Sherryll Burger, MD   1 tablet at 02/07/20 0839  . ipratropium-albuterol (DUONEB) 0.5-2.5 (3) MG/3ML nebulizer solution 3 mL  3 mL Inhalation BID [place] Pink,  Gokul, MD      . labetalol (NORMODYNE) injection 10 mg  10 mg Intravenous Q2H PRN Manuella Ghazi, Pratik D, DO      . levalbuterol (XOPENEX) nebulizer solution 0.63 mg  0.63 mg Nebulization Q6H PRN Manuella Ghazi, Pratik D, DO      . lidocaine (PF) (XYLOCAINE) 1 % injection 5 mL  5 mL Intradermal PRN Reesa Chew, MD      . lidocaine-prilocaine (EMLA) cream 1 application  1 application Topical PRN Reesa Chew, MD      . nicotine (NICODERM CQ - dosed in mg/24 hr) patch 7 mg  7 mg Transdermal Daily Manuella Ghazi, Pratik D, DO      .  ondansetron (ZOFRAN) tablet 4 mg  4 mg Oral Q6H PRN Manuella Ghazi, Pratik D, DO       Or  . ondansetron (ZOFRAN) injection 4 mg  4 mg Intravenous Q6H PRN Manuella Ghazi, Pratik D, DO      . pentafluoroprop-tetrafluoroeth (GEBAUERS) aerosol 1 application  1 application Topical PRN Reesa Chew, MD      . sodium chloride flush (NS) 0.9 % injection 3 mL  3 mL Intravenous Q12H Shah, Pratik D, DO   3 mL at 02/06/20 2108  . sodium chloride flush (NS) 0.9 % injection 3 mL  3 mL Intravenous PRN Manuella Ghazi, Pratik D, DO      . tamsulosin (FLOMAX) capsule 0.4 mg  0.4 mg Oral Daily Manuella Ghazi, Pratik D, DO   0.4 mg at 02/06/20 0959  . topiramate (TOPAMAX) tablet 50 mg  50 mg Oral BID Garvin Fila, MD   50 mg at 02/06/20 2107      Review of Systems: 10 systems reviewed and negative except per interval history/subjective  Physical Exam: Vitals:   02/07/20 1100 02/07/20 1130  BP: (!) 164/84 134/68  Pulse: 66 65  Resp:  14  Temp:    SpO2:     No intake/output data recorded. No intake or output data in the 24 hours ending 02/07/20 1145 General: well-appearing, no acute distress HEENT: anicteric sclera, MMM CV: normal rate, no murmurs, trace edema in the bilateral lower extremities Lungs: bilateral chest rise, scattered expiratory wheezing, normal wob Abd: soft, non-tender, mild distention Skin: no visible lesions or rashes Psych: alert, engaged, appropriate mood and affect Extremities: Trace edema, aVF with palpable thrill   Test Results I personally reviewed new and old clinical labs and radiology tests Lab Results  Component Value Date   NA 136 02/07/2020   K 3.9 02/07/2020   CL 100 02/07/2020   CO2 26 02/07/2020   BUN 28 (H) 02/07/2020   CREATININE 3.40 (H) 02/07/2020   CALCIUM 9.7 02/07/2020   ALBUMIN 3.5 02/05/2020   PHOS 4.1 02/06/2020

## 2020-02-07 NOTE — Progress Notes (Signed)
STROKE TEAM PROGRESS NOTE   INTERVAL HISTORY Patient is lying comfortably in bed.  Is getting dialysis.  He states jaw pain is slightly better after starting Topamax.  Follow-up CT scan shows stable appearance of the tiny punctate left basal ganglia hemorrhage without any increase.  Carotid ultrasound showed no significant extracranial stenosis.  2D echo showed normal ejection fraction without cardiac source of embolism.  Vital signs are stable.  Neurological exam is unchanged.  Vitals:   02/07/20 0930 02/07/20 1000 02/07/20 1030 02/07/20 1100  BP: (!) 162/72 (!) 155/79 (!) 156/82 (!) 164/84  Pulse: (!) 59 64 (!) 58 66  Resp:  16    Temp:      TempSrc:      SpO2:      Weight:      Height:       CBC:  Recent Labs  Lab 02/05/20 0839 02/05/20 0839 02/06/20 0152 02/07/20 0813  WBC 9.5   < > 7.8 7.9  NEUTROABS 7.4  --   --   --   HGB 13.1   < > 11.4* 11.4*  HCT 42.0   < > 36.3* 36.2*  MCV 100.5*   < > 98.9 98.4  PLT 281   < > 250 258   < > = values in this interval not displayed.   Basic Metabolic Panel:  Recent Labs  Lab 02/06/20 0152 02/06/20 1016 02/07/20 0813  NA 133*  --  136  K 4.8  --  3.9  CL 98  --  100  CO2 26  --  26  GLUCOSE 107*  --  109*  BUN 53*  --  28*  CREATININE 4.79*  --  3.40*  CALCIUM 9.5  --  9.7  MG 3.1*  --   --   PHOS  --  4.1  --    Lipid Panel:  Recent Labs  Lab 02/05/20 1447  CHOL 74  TRIG 68  HDL 30*  CHOLHDL 2.5  VLDL 14  LDLCALC 30   HgbA1c:  Recent Labs  Lab 02/05/20 1446  HGBA1C 5.5   Urine Drug Screen:  Recent Labs  Lab 02/05/20 0737  LABOPIA POSITIVE*  COCAINSCRNUR NONE DETECTED  LABBENZ NONE DETECTED  AMPHETMU NONE DETECTED  THCU NONE DETECTED  LABBARB NONE DETECTED    Alcohol Level  Recent Labs  Lab 02/05/20 0839  ETH <10    IMAGING past 24 hours ECHOCARDIOGRAM COMPLETE BUBBLE STUDY  Result Date: 02/06/2020    ECHOCARDIOGRAM REPORT   Patient Name:   Barry Lawrence Date of Exam: 02/06/2020 Medical  Rec #:  [id number]       Height:       67.0 in Accession #:    [id number]      Weight:       174.5 lb Date of Birth:  Apr 19, 1946        BSA:          1.908 m Patient Age:    74 years        BP:           139/77 mmHg Patient Gender: M               HR:           62 bpm. Exam Location:  Inpatient Procedure: 2D Echo, Cardiac Doppler and Color Doppler Indications:     Stroke  History:         Patient has prior history of Echocardiogram examinations. CHF,  CAD, Abnormal ECG and Prior CABG, COPD, Signs/Symptoms:Dyspnea;                  Risk Factors:Hypertension, Dyslipidemia, Current Smoker and                  Sleep Apnea.  Sonographer:     Roseanna Rainbow RDCS Referring Phys:  [place] Diagnosing Phys: Mertie Moores MD IMPRESSIONS  1. Left ventricular ejection fraction, by estimation, is 60 to 65%. The left ventricle has normal function. The left ventricle has no regional wall motion abnormalities. There is moderate left ventricular hypertrophy. Left ventricular diastolic function  could not be evaluated.  2. Right ventricular systolic function is normal. The right ventricular size is normal. There is moderately elevated pulmonary artery systolic pressure.  3. Left atrial size was severely dilated.  4. The mitral valve is normal in structure. Mild mitral valve regurgitation. Mild mitral stenosis.  5. Tricuspid valve regurgitation is moderate.  6. The aortic valve is normal in structure. Aortic valve regurgitation is mild. No aortic stenosis is present.  7. Aortic dilatation noted. There is moderate dilatation of the ascending aorta, measuring 41 mm.  8. The inferior vena cava is dilated in size with <50% respiratory variability, suggesting right atrial pressure of 15 mmHg. FINDINGS  Left Ventricle: Left ventricular ejection fraction, by estimation, is 60 to 65%. The left ventricle has normal function. The left ventricle has no regional wall motion abnormalities. The left ventricular internal cavity  size was normal in size. There is  moderate left ventricular hypertrophy. Left ventricular diastolic function could not be evaluated due to atrial fibrillation. Left ventricular diastolic function could not be evaluated. Right Ventricle: The right ventricular size is normal. No increase in right ventricular wall thickness. Right ventricular systolic function is normal. There is moderately elevated pulmonary artery systolic pressure. The tricuspid regurgitant velocity is 3.39 m/s, and with an assumed right atrial pressure of 15 mmHg, the estimated right ventricular systolic pressure is 63.0 mmHg. Left Atrium: Left atrial size was severely dilated. Right Atrium: Right atrial size was normal in size. Pericardium: There is no evidence of pericardial effusion. Mitral Valve: The mitral valve is normal in structure. Mild mitral valve regurgitation. Mild mitral valve stenosis. MV peak gradient, 9.9 mmHg. The mean mitral valve gradient is 3.0 mmHg. Tricuspid Valve: The tricuspid valve is grossly normal. Tricuspid valve regurgitation is moderate. Aortic Valve: The aortic valve is normal in structure. Aortic valve regurgitation is mild. Aortic regurgitation PHT measures 445 msec. No aortic stenosis is present. Aortic valve mean gradient measures 5.0 mmHg. Aortic valve peak gradient measures 12.8 mmHg. Aortic valve area, by VTI measures 2.33 cm. Pulmonic Valve: The pulmonic valve was normal in structure. Pulmonic valve regurgitation is not visualized. No evidence of pulmonic stenosis. Aorta: Aortic dilatation noted. There is moderate dilatation of the ascending aorta, measuring 41 mm. Venous: The inferior vena cava is dilated in size with less than 50% respiratory variability, suggesting right atrial pressure of 15 mmHg. IAS/Shunts: The atrial septum is grossly normal. Agitated saline contrast was given intravenously to evaluate for intracardiac shunting.  LEFT VENTRICLE PLAX 2D LVIDd:         5.10 cm      Diastology LVIDs:          3.40 cm      LV e' medial:    7.51 cm/s LV PW:         1.70 cm      LV  E/e' medial:  19.6 LV IVS:        1.40 cm      LV e' lateral:   9.25 cm/s LVOT diam:     2.20 cm      LV E/e' lateral: 15.9 LV SV:         73 LV SV Index:   38 LVOT Area:     3.80 cm  LV Volumes (MOD) LV vol d, MOD A2C: 99.2 ml LV vol d, MOD A4C: 113.0 ml LV vol s, MOD A2C: 52.0 ml LV vol s, MOD A4C: 33.7 ml LV SV MOD A2C:     47.2 ml LV SV MOD A4C:     113.0 ml LV SV MOD BP:      67.0 ml RIGHT VENTRICLE            IVC RV S prime:     7.83 cm/s  IVC diam: 2.90 cm TAPSE (M-mode): 1.4 cm LEFT ATRIUM              Index       RIGHT ATRIUM           Index LA diam:        4.30 cm  2.25 cm/m  RA Area:     17.00 cm LA Vol (A2C):   116.0 ml 60.79 ml/m RA Volume:   45.50 ml  23.84 ml/m LA Vol (A4C):   70.3 ml  36.84 ml/m LA Biplane Vol: 97.6 ml  51.14 ml/m  AORTIC VALVE                   PULMONIC VALVE AV Area (Vmax):    2.53 cm    PR End Diast Vel: 2.71 msec AV Area (Vmean):   2.27 cm AV Area (VTI):     2.33 cm AV Vmax:           179.00 cm/s AV Vmean:          99.100 cm/s AV VTI:            0.312 m AV Peak Grad:      12.8 mmHg AV Mean Grad:      5.0 mmHg LVOT Vmax:         119.00 cm/s LVOT Vmean:        59.300 cm/s LVOT VTI:          0.191 m LVOT/AV VTI ratio: 0.61 AI PHT:            445 msec  AORTA Ao Root diam: 3.70 cm Ao Asc diam:  4.10 cm MITRAL VALVE                TRICUSPID VALVE MV Area (PHT): 4.89 cm     TR Peak grad:   46.0 mmHg MV Peak grad:  9.9 mmHg     TR Vmax:        339.00 cm/s MV Mean grad:  3.0 mmHg MV Vmax:       1.57 m/s     SHUNTS MV Vmean:      67.6 cm/s    Systemic VTI:  0.19 m MV Decel Time: 155 msec     Systemic Diam: 2.20 cm MV E velocity: 147.00 cm/s Mertie Moores MD Electronically signed by Mertie Moores MD Signature Date/Time: 02/06/2020/2:42:43 PM    Final (Updated)    VAS US CAROTID  Result Date: 02/06/2020 Carotid Arterial Duplex Study Indications:  TIA and CVA. Risk Factors: Hypertension, hyperlipidemia,  Diabetes, prior  CVA. Performing Technologist: Griffin Basil RCT RDMS  Examination Guidelines: A complete evaluation includes B-mode imaging, spectral Doppler, color Doppler, and power Doppler as needed of all accessible portions of each vessel. Bilateral testing is considered an integral part of a complete examination. Limited examinations for reoccurring indications may be performed as noted.  Right Carotid Findings: +----------+--------+--------+--------+------------------+------------------+           PSV cm/sEDV cm/sStenosisPlaque DescriptionComments           +----------+--------+--------+--------+------------------+------------------+ CCA Prox  83      12                                                   +----------+--------+--------+--------+------------------+------------------+ CCA Distal108     17                                intimal thickening +----------+--------+--------+--------+------------------+------------------+ ICA Prox  107     23      1-39%   calcific          intimal thickening +----------+--------+--------+--------+------------------+------------------+ ICA Distal126     24                                                   +----------+--------+--------+--------+------------------+------------------+ ECA       170     19                                                   +----------+--------+--------+--------+------------------+------------------+ +----------+--------+-------+--------+-------------------+           PSV cm/sEDV cmsDescribeArm Pressure (mmHG) +----------+--------+-------+--------+-------------------+ [id number]     45                                 +----------+--------+-------+--------+-------------------+ +---------+--------+--+--------+--+ VertebralPSV cm/s81EDV cm/s15 +---------+--------+--+--------+--+  Left Carotid Findings: +----------+--------+--------+--------+------------------+------------------+            PSV cm/sEDV cm/sStenosisPlaque DescriptionComments           +----------+--------+--------+--------+------------------+------------------+ CCA Prox  71      14                                                   +----------+--------+--------+--------+------------------+------------------+ CCA Distal59      14                                intimal thickening +----------+--------+--------+--------+------------------+------------------+ ICA Prox  102     15      1-39%   calcific          intimal thickening +----------+--------+--------+--------+------------------+------------------+ ICA Distal100     20                                                   +----------+--------+--------+--------+------------------+------------------+  ECA       543             >50%    calcific                             +----------+--------+--------+--------+------------------+------------------+ +---------+--------+--+--------+--+ VertebralPSV cm/s70EDV cm/s15 +---------+--------+--+--------+--+   Summary: Right Carotid: Velocities in the right ICA are consistent with a 1-39% stenosis. Left Carotid: Velocities in the left ICA are consistent with a 1-39% stenosis.               The ECA appears >50% stenosed. Vertebrals: Bilateral vertebral arteries demonstrate antegrade flow. *See table(s) above for measurements and observations.  Electronically signed by Harold Barban MD on 02/06/2020 at 5:20:43 PM.    Final     PHYSICAL EXAM Pleasant elderly Caucasian male not in distress.  He has soft tissue swelling over the right mandible which is not tender to touch. . Afebrile. Head is nontraumatic. Neck is supple without bruit.    Cardiac exam no murmur or gallop. Lungs are clear to auscultation. Distal pulses are well felt.  Neurological Exam :  Awake alert oriented to time place and person.  Normal speech and language.  No aphasia apraxia or dysarthria.  Extraocular movements are full range  without nystagmus.  Blinks to threat bilaterally.  Fundi not visualized.  Motor system exam shows no upper or lower extremity drift but mild weakness of right hip flexors.  Fine finger movements slightly diminished on the right compared to the left.  He is unsteady while standing on a narrow base and on the right foot unsupported.  Sensation is intact.  Coordination is slow but accurate.  Gait is deferred  ASSESSMENT/PLAN Barry Lawrence is a 74 y.o. male with history of AF on warfarin, ESRD on HD TTS, HTN, HLD, pre-diabetes, PAD s/p aorta-bifem BPG and aorta/renal bypass, carotid stenosis s/p R CEA who presented to [facility] from the dentist office where he had been several times for recurrent jaw pain. INR 3.2 on arrival so he did not receive IV t-PA. CT showed small L PLIC w/ possibly hemorrhagic. Received FFP and vit K. Transferred to [facility] for ongoing care.    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 ASPECTS 9    MRI  Small L PLIC hemorrhagic infarct. Small vessel disease.   MRA  L P2 moderate stenosis   Carotid Doppler B ICA 1-39% stenosis, VAs antegrade. L ECA > 50% stenosis   2D Echo normal ejection fraction.  No cardiac source of embolism.  LDL 30  HgbA1c 5.5   SCDs for VTE prophylaxis  warfarin daily prior to admission with INR 3.2 on arrival, on No antithrombotic now due to intracranial hemorrhage but will start aspirin in a few days and resume warfarin in 2 to 4 weeks.    Therapy recommendations:  OP PT, OP OT  Disposition:  Anticipate return home  Atrial Fibrillation  Home anticoagulation:  warfarin daily   INR on admission to  [facility] 3.2  INR 1.4 today  Resume  aspirin 81 mg daily in 2 to 3 days and warfarin in 2 to 4 weeks  Hypertension  Stable  SBP goal < 180 given petechial hemorrhage   Long-term BP goal normotensive  Hyperlipidemia  Home meds:  lipitor 80, resumed in hospital  LDL 30, goal < 70  Continue statin at discharge  Pre-Diabetes   HgbA1c 5.5, at goal < 7.0  Other Stroke Risk Factors  Advanced age  Cigarette smoker, advised to stop smoking  Coronary artery disease s/p CABG  Obstructive sleep apnea, on CPAP at home  AAA w/ plans for OR soon. Followed by [facility]  Hx carotid stenosis s/p R CEA 2009 [facility])  PAD s/p Aorta-B fem artery BPG 2016 a/ aortic renal bypass  Other Active Problems  Lung cancer, L apical nodule highly suspicious for malignancy on PET scan 11/2019, Dr. Melvyn Novas referred to Dr. Julien Nordmann with appt end of Sept     Jaw pain, started on topamax 50 bid for Trigeminal Neuralgia treatment - not effective yet. Continue topamax.   ESRD on HD TTS  Anemia of Chronic Kidney Disease  Secondary Hyperparathyroidism/Hyperphosphatemia  BPH  COPD  Non specific prostate PET activity, cannot rule out neoplasm 11/2019  Hospital day # 2 Recommend start aspirin 81 mg daily for stroke prevention and discharge and after 2 weeks may change aspirin to warfarin for secondary stroke prevention given history of A. fib.  Aim for target INR between 2 and 3.  Continue Topamax 50 mg twice daily for neurologic trigeminal pain and follow-up as an outpatient stroke clinic in 6 weeks.  Discussed with Dr. Maryland Pink.  Stroke team will sign off.  Greater than 50% time during this 25-minute visit was spent in counseling and coordination of care about his lacunar hemorrhagic stroke as well as neurologic facial pain and answering questions Antony Contras, MD To contact Stroke Continuity provider, please refer to [email or web address] After hours, contact General Neurology

## 2020-02-07 NOTE — Progress Notes (Signed)
Occupational Therapy Treatment Patient Details Name: Barry Lawrence MRN: [id number] DOB: 1945/11/04 Today's Date: 02/07/2020    History of present illness Pt is a 74 y/o male with a PMH significant for MI - CABG 1996, PAD, HTN, borderline DM, CKD III on HD, CAD, AAA s/p aortobifemoral bypass grafting, back surgery x2. He presents to the ED [facility] with bilateral UE weakness CT and MRI revealed hemorrhagic stroke ini posterior limb of the internal capsule. Pt transferred to [facility] for further neurology work-up.   OT comments  Pt progressing well toward stated OT goals. Session focused on ADL mobility progression and IADL simulation to prepare for safe d/c. Pt able to complete functional mobility beyond a household distance with RW at supervision level. He reports he feels his bil UE weakness is improving but still feels a bit incoordinated. Pt able to simulate stepping over a curb (as he would have to for transport to HD). D/c recs remain appropriate, will continue to follow per POC listed below.   Follow Up Recommendations  Outpatient OT (pt requests [place])    Equipment Recommendations  Other (comment) (rollator)    Recommendations for Other Services      Precautions / Restrictions Precautions Precautions: Fall Restrictions Weight Bearing Restrictions: No       Mobility Bed Mobility Overal bed mobility: Independent                Transfers Overall transfer level: Needs assistance Equipment used: Rolling walker (2 wheeled) Transfers: Sit to/from Stand Sit to Stand: Supervision              Balance Overall balance assessment: Needs assistance Sitting-balance support: Feet supported;No upper extremity supported Sitting balance-Leahy Scale: Fair     Standing balance support: Single extremity supported;During functional activity Standing balance-Leahy Scale: Fair Standing balance comment: Reaching out for external support during dynamic  standing activity                           ADL either performed or assessed with clinical judgement   ADL Overall ADL's : Needs assistance/impaired                 Upper Body Dressing : Set up;Sitting Upper Body Dressing Details (indicate cue type and reason): dressing in own shirt Lower Body Dressing: Minimal assistance;Cueing for compensatory techniques Lower Body Dressing Details (indicate cue type and reason): donned own boxer shorts. Wears slip on sandals at baseline due to decreased ability to reach feet Toilet Transfer: Ambulation;Supervision/safety           Functional mobility during ADLs: Supervision/safety;Rolling walker       Vision Baseline Vision/History: Wears glasses Wears Glasses: Reading only     Perception     Praxis      Cognition Arousal/Alertness: Awake/alert Behavior During Therapy: WFL for tasks assessed/performed Overall Cognitive Status: Within Functional Limits for tasks assessed                                          Exercises     Shoulder Instructions       General Comments      Pertinent Vitals/ Pain       Pain Assessment: No/denies pain  Home Living  Prior Functioning/Environment              Frequency  Min 2X/week        Progress Toward Goals  OT Goals(current goals can now be found in the care plan section)  Progress towards OT goals: Progressing toward goals  Acute Rehab OT Goals Patient Stated Goal: Be able to have his cataract surgery on the 27th OT Goal Formulation: With patient/family Time For Goal Achievement: 02/20/20 Potential to Achieve Goals: Good  Plan Discharge plan remains appropriate    Co-evaluation                 AM-PAC OT "6 Clicks" Daily Activity     Outcome Measure   Help from another person eating meals?: None Help from another person taking care of personal grooming?: A  Little Help from another person toileting, which includes using toliet, bedpan, or urinal?: A Little Help from another person bathing (including washing, rinsing, drying)?: A Little Help from another person to put on and taking off regular upper body clothing?: A Little Help from another person to put on and taking off regular lower body clothing?: A Little 6 Click Score: 19    End of Session Equipment Utilized During Treatment: Gait belt;Rolling walker  OT Visit Diagnosis: Unsteadiness on feet (R26.81);Muscle weakness (generalized) (M62.81)   Activity Tolerance Patient tolerated treatment well   Patient Left in bed;with call bell/phone within reach   Nurse Communication Mobility status        Time: 9038-3338 OT Time Calculation (min): 15 min  Charges: OT General Charges $OT Visit: 1 Visit OT Treatments $Self Care/Home Management : 8-22 mins  Zenovia Jarred, MSOT, OTR/L [facility] Office Number: [phone] Pager: [phone]  Zenovia Jarred 02/07/2020, 5:21 PM

## 2020-02-11 LAB — BPAM FFP
Blood Product Expiration Date: 202109192359
Blood Product Expiration Date: 202109192359
ISSUE DATE / TIME: 202109141457
Unit Type and Rh: 600
Unit Type and Rh: 6200

## 2020-02-11 LAB — PREPARE FRESH FROZEN PLASMA
Unit division: 0
Unit division: 0

## 2020-02-13 ENCOUNTER — Telehealth: Payer: Self-pay | Admitting: Medical Oncology

## 2020-02-13 NOTE — Telephone Encounter (Signed)
LVM that I was returning his call.

## 2020-02-13 NOTE — Telephone Encounter (Signed)
Appt  cancelled with Dr Julien Nordmann. Pt called to cancel his appt . "I have a lot going on with dialysis tomorrow and tooth extraction Friday for "Jaw Pain". (Dr Loyal Gambler).  " If I have cancer it is the least of my worries right now. I don't even have a biopsy". I strongly encouraged him to keep appt , but he declined. He said he will call back when he is ready to r/s.

## 2020-02-14 NOTE — Telephone Encounter (Signed)
I have not seen him before.  We are here to help him whenever he is ready to be evaluated.  Thank you.

## 2020-02-15 ENCOUNTER — Other Ambulatory Visit: Payer: Self-pay | Admitting: *Deleted

## 2020-02-15 DIAGNOSIS — I119 Hypertensive heart disease without heart failure: Secondary | ICD-10-CM

## 2020-02-18 ENCOUNTER — Inpatient Hospital Stay: Payer: Medicare Other | Attending: Internal Medicine | Admitting: Internal Medicine

## 2020-02-18 ENCOUNTER — Inpatient Hospital Stay: Payer: Medicare Other

## 2020-02-20 ENCOUNTER — Telehealth: Payer: Self-pay | Admitting: *Deleted

## 2020-02-20 NOTE — Telephone Encounter (Signed)
I called Barry Lawrence to see if he would like to be seen with Dr. Julien Nordmann, medical oncology.  I listened as he explained he does not want to see Dr. Julien Nordmann at this time.  He will call back if he changes his mind.

## 2020-02-20 NOTE — Telephone Encounter (Signed)
I called and spoke with Barry Lawrence today.  He is not interested in see med onc at this time.  He will call if he changes his mind.

## 2020-02-22 ENCOUNTER — Emergency Department (HOSPITAL_COMMUNITY): Payer: No Typology Code available for payment source

## 2020-02-22 ENCOUNTER — Emergency Department (HOSPITAL_COMMUNITY)
Admission: EM | Admit: 2020-02-22 | Discharge: 2020-02-22 | Disposition: A | Discharge status: Home / Self Care | Payer: No Typology Code available for payment source | Attending: Emergency Medicine | Admitting: Emergency Medicine

## 2020-02-22 DIAGNOSIS — J449 Chronic obstructive pulmonary disease, unspecified: Secondary | ICD-10-CM | POA: Insufficient documentation

## 2020-02-22 DIAGNOSIS — I132 Hypertensive heart and chronic kidney disease with heart failure and with stage 5 chronic kidney disease, or end stage renal disease: Secondary | ICD-10-CM | POA: Diagnosis not present

## 2020-02-22 DIAGNOSIS — I2510 Atherosclerotic heart disease of native coronary artery without angina pectoris: Secondary | ICD-10-CM | POA: Diagnosis not present

## 2020-02-22 DIAGNOSIS — F17210 Nicotine dependence, cigarettes, uncomplicated: Secondary | ICD-10-CM | POA: Diagnosis not present

## 2020-02-22 DIAGNOSIS — M25552 Pain in left hip: Secondary | ICD-10-CM

## 2020-02-22 DIAGNOSIS — Z79899 Other long term (current) drug therapy: Secondary | ICD-10-CM | POA: Diagnosis not present

## 2020-02-22 DIAGNOSIS — Z992 Dependence on renal dialysis: Secondary | ICD-10-CM | POA: Insufficient documentation

## 2020-02-22 DIAGNOSIS — E1122 Type 2 diabetes mellitus with diabetic chronic kidney disease: Secondary | ICD-10-CM | POA: Insufficient documentation

## 2020-02-22 DIAGNOSIS — Z7901 Long term (current) use of anticoagulants: Secondary | ICD-10-CM | POA: Insufficient documentation

## 2020-02-22 DIAGNOSIS — M25559 Pain in unspecified hip: Secondary | ICD-10-CM

## 2020-02-22 DIAGNOSIS — I5033 Acute on chronic diastolic (congestive) heart failure: Secondary | ICD-10-CM | POA: Diagnosis not present

## 2020-02-22 DIAGNOSIS — Z951 Presence of aortocoronary bypass graft: Secondary | ICD-10-CM | POA: Diagnosis not present

## 2020-02-22 DIAGNOSIS — N186 End stage renal disease: Secondary | ICD-10-CM | POA: Insufficient documentation

## 2020-02-22 MED ORDER — HYDROCODONE-ACETAMINOPHEN 5-325 MG PO TABS: 1.0000 | ORAL_TABLET | Freq: Four times a day (QID) | ORAL | 0 refills | Status: AC | PRN

## 2020-02-22 NOTE — Discharge Instructions (Signed)
I am prescribing you a strong narcotic called Vicodin.  Please only take this for breakthrough pain.  This medication can be constipating, so consider taking a stool softener and increasing your fluid intake while taking this.  Please return to the ER if you develop any new or worsening symptoms.  Please follow-up with your primary care provider on Monday regarding your symptoms.  It was a pleasure to meet you.

## 2020-02-22 NOTE — ED Provider Notes (Signed)
[facility] EMERGENCY DEPARTMENT Provider Note   CSN: [id number] Arrival date & time: 02/22/20  0847     History Chief Complaint  Patient presents with  . Leg Pain    Barry Lawrence is a 74 y.o. male.  HPI   Patient is a 74 year old male with a medical history as noted below.  Patient states that he was admitted to the hospital 2 weeks ago.  Ever since this admission he has been experiencing mild left hip pain.  He states that he was in the bathroom and was bearing weight on the left leg and twisted resulting in a worsening of his pain.  Pain worsens with ambulation and when bearing weight.  He states that he uses a walker for assistance but can also ambulate without a walker.  No recent falls or trauma.  No numbness or tingling.  Patient has taken Tylenol for his pain with minimal relief.  Patient notes his history of ESRD on HD and was last dialyzed yesterday.  Patient is concerned regarding this pain because it worsens significantly when sitting for long periods of time and is concerned that he will not be able to complete his dialysis session tomorrow.  No fevers, chills, chest pain, shortness of breath, abdominal pain, vomiting, diarrhea. Patient smokes 1 pack/day.     Past Medical History:  Diagnosis Date  . Abdominal aortic aneurysm ([place]) 01/28/10; 02/05/14   4.3x4.6cm;  now measuring 5.5 cm, status post aortobifemoral bypass grafting in January 2016 by Dr. problem along with left renal artery bypass  . Arthritis    in his back  . CAD (coronary artery disease)    myoview 04/21/11-normal, EF49%  . Carotid stenosis 01/15/08   right endarterectomy-Dr. Amedeo Plenty  . CKD (chronic kidney disease), stage III   . Diabetes ([place])    BORDERLINE  . GERD (gastroesophageal reflux disease)   . Headache    uses [employer], states he is lessening what he is taking for prep. for surgery   . Hyperlipemia   . Hypertension    stress test- done 04/2014, followed by Dr. Gwenlyn Found  . Myocardial  infarction ([place]) 1996  . OSA on CPAP    CPAP q night , CPAP is through [place], states doesn't remember when he had the last study  . PAD (peripheral artery disease) ([place]) 06/12/09   a. 11/22/07 PTA & stenting right external iliac artery;bilateral iliac & PTI and stenting 1997;right ICA =>50% reduction,right SFA >50%,left ICA at stent => 50% reduction,left EIA => 50% reduction,left ATA occluded, left SFA mid > 49% reduction;  03/2014 Angio: LRA 90, patent L Iliac stent and distal RCIA stent, large saccular AAA.  [name] Pneumonia    hosp. as a child with pneumonia, not since   . Polycythemia    H/O  . S/P CABG (coronary artery bypass graft) 1996  . Tobacco abuse     Patient Active Problem List   Diagnosis Date Noted  . Hemorrhagic cerebrovascular accident (CVA) ([place]) 02/05/2020  . CKD (chronic kidney disease) stage 5, GFR less than 15 ml/min (HCC) 08/07/2018  . Pulmonary edema   . Systolic congestive heart failure ([place])   . Noncardiogenic pulmonary edema 08/06/2018  . Bradycardia, drug induced 08/05/2018  . SOB (shortness of breath) 08/04/2018  . Pleural effusion   . Parapneumonic effusion 12/12/2014  . Pneumonia due to Klebsiella pneumoniae ([place])   . Acute on chronic diastolic heart failure ([place])   . Sepsis ([place]) 11/30/2014  . CAP (community acquired pneumonia)  11/29/2014  . Chronic diastolic CHF (congestive heart failure) ([place]) 11/29/2014  . Cellulitis of left lower extremity 10/06/2014  . Solitary pulmonary nodule 09/04/2014  . Warfarin anticoagulation   . Hypokalemia 06/15/2014  . Wound infection 06/15/2014  . Encounter for central line placement   . HCAP (healthcare-associated pneumonia)   . AAA (abdominal aortic aneurysm) ([place]) 05/30/2014  . Acute respiratory failure with hypoxia ([place])   . Post-operative state   . Respiratory failure ([place])   . Thoracic ascending aortic aneurysm ([place]) 03/31/2014  . CKD (chronic kidney disease), stage IV ([place])   . Sleep apnea   . Cigarette smoker  03/29/2014  . COPD GOLD II still smoking  03/26/2014  . Abdominal aortic aneurysm without rupture ([place]) 03/18/2014  . Hyperlipidemia 02/16/2013  . CAD (coronary artery disease) 07/15/2012  . GERD (gastroesophageal reflux disease) 07/15/2012  . Carotid artery disease   . Hypertensive heart disease   . PAD (peripheral artery disease) ([place]) 06/12/2009    Past Surgical History:  Procedure Laterality Date  . ABDOMINAL AORTAGRAM N/A 04/01/2014   Procedure: ABDOMINAL Maxcine Ham;  Surgeon: [name], MD;  Location: [facility] CATH LAB;  Service: Cardiovascular;  Laterality: N/A;  . AORTA - BILATERAL FEMORAL ARTERY BYPASS GRAFT N/A 05/30/2014   Procedure: AORTOBIFEMORAL BYPASS GRAFT;  Surgeon: Serafina Mitchell, MD;  Location: [place];  Service: Vascular;  Laterality: N/A;  . AORTIC/RENAL BYPASS Left 05/30/2014   Procedure: LEFT RENAL ARTERY BYPASS;  Surgeon: Serafina Mitchell, MD;  Location: [place];  Service: Vascular;  Laterality: Left;  . BACK SURGERY  1988   at [facility]  . CAROTID ENDARTERECTOMY Right 01/15/08  . COLONOSCOPY N/A 07/09/2013   Procedure: COLONOSCOPY;  Surgeon: Juanita Craver, MD;  Location: [facility];  Service: Endoscopy;  Laterality: N/A;  . CORONARY ARTERY BYPASS GRAFT  1996   LIMA to LAD, sequential vein to OM1 and 2 as well as acure marginal branch and diag branch  . EMPYEMA DRAINAGE Right 12/17/2014   Procedure: EMPYEMA DRAINAGE;  Surgeon: Gaye Pollack, MD;  Location: [place];  Service: Thoracic;  Laterality: Right;  . [place] SURGERY  1998  . pv angio  11/22/2007   PTA/ stenting of right common iliac artery with a 10x68mm Smart stent and postdilated with a 7x70mmpowerflex balloon; high grade calcified stenosis of the RICA  . PV angio  04/01/14   AAA, Lt renal artery stenosis, patent iliacs  . VIDEO ASSISTED THORACOSCOPY (VATS)/DECORTICATION Right 12/17/2014   Procedure: RIGHT VIDEO ASSISTED THORACOSCOPY (VATS);  Surgeon: Gaye Pollack, MD;  Location: [facility] OR;  Service: Thoracic;   Laterality: Right;       Family History  Problem Relation Age of Onset  . Heart failure Father     Social History   Tobacco Use  . Smoking status: Current Every Day Smoker    Packs/day: 1.00    Years: 56.00    Pack years: 56.00    Types: Cigarettes  . Smokeless tobacco: Never Used  Vaping Use  . Vaping Use: Former  Substance Use Topics  . Alcohol use: No    Alcohol/week: 0.0 standard drinks  . Drug use: No    Home Medications Prior to Admission medications   Medication Sig Start Date End Date Taking? Authorizing Provider  albuterol (ACCUNEB) 0.63 MG/3ML nebulizer solution Take 1 ampule by nebulization every 6 (six) hours as needed for wheezing.    [provider]  amLODipine (NORVASC) 5 MG tablet Take 5 mg by mouth See  admin instructions. Take 5 mg on Mon, Wed, Fri, and Sunday.    [provider]  atorvastatin (LIPITOR) 80 MG tablet Take 80 mg by mouth at bedtime.     [provider]  clindamycin (CLEOCIN) 150 MG capsule Take 150 mg by mouth in the morning and at bedtime.    [provider]  famotidine (PEPCID) 20 MG tablet Take 20 mg by mouth daily.     [provider]  ferric citrate (AURYXIA) 1 GM 210 MG(Fe) tablet Take 210 mg by mouth 3 (three) times daily with meals.    [provider]  finasteride (PROSCAR) 5 MG tablet Take 5 mg by mouth daily.    [provider]  furosemide (LASIX) 80 MG tablet Take 2 tablets (160 mg total) by mouth 2 (two) times daily. Patient taking differently: Take 160 mg by mouth See admin instructions. Take 160 mg twice daily on Mon,Wed, Fri and Sunday. 08/08/18   Orson Eva, MD  oxyCODONE (OXY IR/ROXICODONE) 5 MG immediate release tablet Take 5 mg by mouth every 4 (four) hours as needed for severe pain.    [provider]  tamsulosin (FLOMAX) 0.4 MG CAPS capsule Take 0.4 mg by mouth in the morning and at bedtime.     [provider]  topiramate (TOPAMAX) 50 MG tablet  Take 1 tablet (50 mg total) by mouth 2 (two) times daily. 02/07/20   Bonnielee Haff, MD  warfarin (COUMADIN) 5 MG tablet PLEASE RESUME AFTER 2 WEEKS ON 02/21/20: Take 1 tablet daily except 1/2 tablet on Mondays, Wednesdays and Fridays or as directed 02/21/20   Bonnielee Haff, MD    Allergies    Carvedilol, Metolazone, and Niaspan [niacin er]  Review of Systems   Review of Systems  All other systems reviewed and are negative. Ten systems reviewed and are negative for acute change, except as noted in the HPI.   Physical Exam Updated Vital Signs BP (!) 164/71 (BP Location: Left Arm)   Pulse 73   Temp 98.1 F (36.7 C) (Oral)   Resp 14   SpO2 98%   Physical Exam Vitals and nursing note reviewed.  Constitutional:      General: He is not in acute distress.    Appearance: Normal appearance. He is normal weight. He is not ill-appearing, toxic-appearing or diaphoretic.  HENT:     Head: Normocephalic and atraumatic.     Right Ear: External ear normal.     Left Ear: External ear normal.     Nose: Nose normal.     Mouth/Throat:     Pharynx: Oropharynx is clear.  Eyes:     Extraocular Movements: Extraocular movements intact.  Cardiovascular:     Rate and Rhythm: Normal rate and regular rhythm.     Pulses: Normal pulses.     Heart sounds: Normal heart sounds. No murmur heard.  No friction rub. No gallop.      Comments: AV fistula noted along the right arm. Good thrill. Pulmonary:     Effort: Pulmonary effort is normal. No respiratory distress.     Breath sounds: Normal breath sounds. No stridor. No wheezing, rhonchi or rales.  Abdominal:     General: Abdomen is flat.     Palpations: Abdomen is soft.     Tenderness: There is no abdominal tenderness.     Hernia: A hernia is present.  Musculoskeletal:        General: Tenderness present.     Right lower leg: No edema.  Left lower leg: No edema.     Comments: Moderate TTP noted along the left posterior hip.  Unable to assess range  of motion of the left hip secondary to pain. Full range of motion of the left knee and ankle. Patient is able to stand and ambulate with an antalgic gait, unassisted.  Distal sensation intact.  Palpable pedal pulses.  Skin:    General: Skin is warm and dry.  Neurological:     General: No focal deficit present.     Mental Status: He is alert and oriented to person, place, and time.  Psychiatric:        Mood and Affect: Mood normal.        Behavior: Behavior normal.    ED Results / Procedures / Treatments   Labs (all labs ordered are listed, but only abnormal results are displayed) Labs Reviewed - No data to display  EKG None  Radiology DG HIP UNILAT WITH PELVIS 2-3 VIEWS LEFT  Result Date: 02/22/2020 CLINICAL DATA:  Severe pain, popping sensation EXAM: DG HIP (WITH OR WITHOUT PELVIS) 2-3V LEFT COMPARISON:  06/19/2014 CT reconstructions FINDINGS: Intact left hip. Negative for fracture. Bony pelvis and hips appear symmetric. No acute osseous finding. Extensive aorto iliac and femoral atherosclerosis. Iliac stents noted. Nonobstructive bowel gas pattern. IMPRESSION: No acute osseous finding Aortic Atherosclerosis (ICD10-I70.0). Electronically Signed   By: Jerilynn Mages.  Shick M.D.   On: 02/22/2020 09:17   Procedures Procedures (including critical care time)  Medications Ordered in ED Medications - No data to display  ED Course  I have reviewed the triage vital signs and the nursing notes.  Pertinent labs & imaging results that were available during my care of the patient were reviewed by me and considered in my medical decision making (see chart for details).    MDM Rules/Calculators/A&P                          Pt is a 74 y.o. male that presents with a history, physical exam, and ED Clinical Course as noted above.   Patient presents today due to worsening left hip pain.  X-rays were obtained which were negative for any acute abnormalities.  Patient does have some moderate left  posterior hip pain but otherwise no acute abnormalities noted on my physical exam.  Patient is able to stand and ambulate with an antalgic gait.  Patient has a walker at home which he is able to use.  We will discharge patient on a very short course of Vicodin.  We had a lengthy discussion regarding safety with this medication.  Recommended that he follow-up with his primary care provider.  Patient has a hemodialysis session tomorrow.  He understands he can return to the ER if he develops any new or worsening symptoms.  His questions were answered and he was amicable at the time of discharge.  His vital signs are stable.   An After Visit Summary was printed and given to the patient.  Patient discharged to home/self care.  Condition at discharge: Stable  Note: Portions of this report may have been transcribed using voice recognition software. Every effort was made to ensure accuracy; however, inadvertent computerized transcription errors may be present.   Final Clinical Impression(s) / ED Diagnoses Final diagnoses:  Left hip pain   Rx / DC Orders ED Discharge Orders         Ordered    HYDROcodone-acetaminophen (NORCO/VICODIN) 5-325 MG tablet  Every 6  hours PRN        02/22/20 1232           Rayna Sexton, PA-C 02/22/20 1236    Milton Ferguson, MD 02/24/20 [phone]

## 2020-02-22 NOTE — ED Triage Notes (Signed)
Pt states he has twisted his left hip. Pt went to the bathroom and feels like it twisted his hip out of socket. Pt was able to walk with his walker yesterday, but it hurt him. The pain is worse this morning.

## 2020-03-10 ENCOUNTER — Ambulatory Visit: Payer: No Typology Code available for payment source | Admitting: Internal Medicine

## 2020-03-12 ENCOUNTER — Ambulatory Visit: Payer: No Typology Code available for payment source | Admitting: Internal Medicine

## 2020-04-23 ENCOUNTER — Inpatient Hospital Stay: Payer: No Typology Code available for payment source | Admitting: Neurology

## 2020-04-23 DEATH — deceased
